# Patient Record
Sex: Male | Born: 1965 | Race: White | Hispanic: No | Marital: Married | Smoking: Former smoker
Health system: Southern US, Community
[De-identification: ages and names within clinical notes are randomized; demographics above are authoritative.]

## PROBLEM LIST (undated history)

## (undated) ENCOUNTER — Encounter
Admission: RE | Disposition: A | Discharge status: Home / Self Care | Payer: Self-pay | Source: Home / Self Care | Attending: Surgery

## (undated) DIAGNOSIS — I10 Essential (primary) hypertension: Secondary | ICD-10-CM

## (undated) DIAGNOSIS — J449 Chronic obstructive pulmonary disease, unspecified: Secondary | ICD-10-CM

## (undated) DIAGNOSIS — Z85528 Personal history of other malignant neoplasm of kidney: Secondary | ICD-10-CM

## (undated) DIAGNOSIS — M1990 Unspecified osteoarthritis, unspecified site: Secondary | ICD-10-CM

## (undated) DIAGNOSIS — K219 Gastro-esophageal reflux disease without esophagitis: Secondary | ICD-10-CM

## (undated) DIAGNOSIS — N200 Calculus of kidney: Secondary | ICD-10-CM

## (undated) DIAGNOSIS — E785 Hyperlipidemia, unspecified: Secondary | ICD-10-CM

## (undated) DIAGNOSIS — C801 Malignant (primary) neoplasm, unspecified: Secondary | ICD-10-CM

## (undated) DIAGNOSIS — E119 Type 2 diabetes mellitus without complications: Secondary | ICD-10-CM

## (undated) DIAGNOSIS — L409 Psoriasis, unspecified: Secondary | ICD-10-CM

## (undated) DIAGNOSIS — Z972 Presence of dental prosthetic device (complete) (partial): Secondary | ICD-10-CM

## (undated) DIAGNOSIS — L309 Dermatitis, unspecified: Secondary | ICD-10-CM

## (undated) HISTORY — DX: Essential (primary) hypertension: I10

## (undated) HISTORY — DX: Dermatitis, unspecified: L30.9

## (undated) HISTORY — DX: Personal history of other malignant neoplasm of kidney: Z85.528

## (undated) HISTORY — PX: OTHER SURGICAL HISTORY: SHX169

## (undated) HISTORY — DX: Calculus of kidney: N20.0

## (undated) HISTORY — PX: KNEE SURGERY: SHX244

## (undated) HISTORY — PX: VARICOCELE EXCISION: SUR582

## (undated) HISTORY — DX: Hyperlipidemia, unspecified: E78.5

## (undated) HISTORY — PX: REPLACEMENT TOTAL KNEE: SUR1224

## (undated) HISTORY — PX: JOINT REPLACEMENT: SHX530

## (undated) HISTORY — DX: Type 2 diabetes mellitus without complications: E11.9

## (undated) SURGERY — REPAIR, HERNIA, VENTRAL, ROBOT-ASSISTED
Anesthesia: General

---

## 2003-04-17 ENCOUNTER — Emergency Department (HOSPITAL_COMMUNITY): Admission: EM | Admit: 2003-04-17 | Discharge: 2003-04-17 | Payer: Self-pay | Admitting: Emergency Medicine

## 2003-04-22 ENCOUNTER — Encounter: Admission: RE | Admit: 2003-04-22 | Discharge: 2003-07-21 | Payer: Self-pay | Admitting: Family Medicine

## 2005-05-21 ENCOUNTER — Emergency Department (HOSPITAL_COMMUNITY): Admission: EM | Admit: 2005-05-21 | Discharge: 2005-05-21 | Payer: Self-pay | Admitting: Emergency Medicine

## 2009-04-18 ENCOUNTER — Emergency Department: Payer: Self-pay | Admitting: Unknown Physician Specialty

## 2010-09-15 ENCOUNTER — Emergency Department: Payer: Self-pay | Admitting: Emergency Medicine

## 2013-02-01 ENCOUNTER — Other Ambulatory Visit: Payer: Self-pay | Admitting: Family Medicine

## 2013-02-01 LAB — COMPREHENSIVE METABOLIC PANEL
Albumin: 3.7 g/dL (ref 3.4–5.0)
Alkaline Phosphatase: 96 U/L (ref 50–136)
Anion Gap: 5 — ABNORMAL LOW (ref 7–16)
BUN: 24 mg/dL — ABNORMAL HIGH (ref 7–18)
Bilirubin,Total: 0.4 mg/dL (ref 0.2–1.0)
Calcium, Total: 9.1 mg/dL (ref 8.5–10.1)
Chloride: 104 mmol/L (ref 98–107)
Co2: 26 mmol/L (ref 21–32)
Creatinine: 0.86 mg/dL (ref 0.60–1.30)
EGFR (African American): >60
EGFR (Non-African Amer.): >60
Glucose: 217 mg/dL — ABNORMAL HIGH (ref 65–99)
Osmolality: 281 (ref 275–301)
Potassium: 4.2 mmol/L (ref 3.5–5.1)
SGOT(AST): 16 U/L (ref 15–37)
SGPT (ALT): 25 U/L (ref 12–78)
Sodium: 135 mmol/L — ABNORMAL LOW (ref 136–145)
Total Protein: 7.1 g/dL (ref 6.4–8.2)

## 2013-02-01 LAB — LIPID PANEL
Cholesterol: 232 mg/dL — ABNORMAL HIGH (ref 0–200)
HDL Cholesterol: 47 mg/dL (ref 40–60)
Ldl Cholesterol, Calc: 162 mg/dL — ABNORMAL HIGH (ref 0–100)
Triglycerides: 117 mg/dL (ref 0–200)
VLDL Cholesterol, Calc: 23 mg/dL (ref 5–40)

## 2013-02-01 LAB — HEMOGLOBIN A1C: Hemoglobin A1C: 10.8 % — ABNORMAL HIGH (ref 4.2–6.3)

## 2013-07-13 HISTORY — PX: NASAL SINUS SURGERY: SHX719

## 2013-07-31 ENCOUNTER — Ambulatory Visit: Payer: Self-pay | Admitting: Anesthesiology

## 2013-07-31 LAB — BASIC METABOLIC PANEL
Anion Gap: 6 — ABNORMAL LOW (ref 7–16)
BUN: 18 mg/dL (ref 7–18)
Calcium, Total: 8.9 mg/dL (ref 8.5–10.1)
Chloride: 100 mmol/L (ref 98–107)
Co2: 26 mmol/L (ref 21–32)
Creatinine: 0.72 mg/dL (ref 0.60–1.30)
EGFR (African American): >60
EGFR (Non-African Amer.): >60
Glucose: 246 mg/dL — ABNORMAL HIGH (ref 65–99)
Osmolality: 275 (ref 275–301)
Potassium: 4.2 mmol/L (ref 3.5–5.1)
Sodium: 132 mmol/L — ABNORMAL LOW (ref 136–145)

## 2013-08-04 ENCOUNTER — Ambulatory Visit: Payer: Self-pay | Admitting: Otolaryngology

## 2013-08-06 LAB — PATHOLOGY REPORT

## 2013-08-11 LAB — WOUND CULTURE

## 2014-07-13 ENCOUNTER — Encounter: Payer: Self-pay | Admitting: Nurse Practitioner

## 2014-07-14 ENCOUNTER — Encounter
Admit: 2014-07-14 | Disposition: A | Discharge status: Home / Self Care | Payer: Self-pay | Attending: Nurse Practitioner | Admitting: Nurse Practitioner

## 2014-08-14 ENCOUNTER — Encounter
Admit: 2014-08-14 | Disposition: A | Discharge status: Home / Self Care | Payer: Self-pay | Attending: Nurse Practitioner | Admitting: Nurse Practitioner

## 2014-09-05 NOTE — Op Note (Signed)
PATIENT NAME:  Matthew Woodard, Matthew Woodard MR#:  [id number] DATE OF BIRTH:  01-Mar-1966  DATE OF PROCEDURE:  08/04/2013  PREOPERATIVE DIAGNOSES:  1.  Chronic rhinosinusitis.  2.  Chronic maxillary sinusitis bilaterally.  3.  Chronic right frontal and ethmoid sinusitis.   POSTOPERATIVE DIAGNOSES: 1.  Chronic rhinosinusitis.  2.  Chronic maxillary sinusitis bilaterally.  3.  Chronic right frontal and ethmoid sinusitis.   PROCEDURE PERFORMED:  1.  Right maxillary antrostomy with tissue removal.  2.  Left maxillary antrostomy with tissue removal.  3.  Right total ethmoidectomy.  4.  Right frontal sinusotomy.  5.  Image-guided sinus surgery.  SURGEON: Carloyn Manner, M.D.   ANESTHESIA: General endotracheal anesthesia.   ESTIMATED BLOOD LOSS: 100 mL.   IV FLUIDS: Please see anesthesia record.   COMPLICATIONS: None.   DRAINS/STENT PLACEMENTS: Stammberger Sinu-Foam and XeroGel.   SPECIMENS: Right-sided sinus contents   INDICATIONS FOR PROCEDURE: The patient is a 49 year old gentleman, status post right maxillary tooth extraction with resultant persistent chronic sinusitis involving the right maxillary sinus and right ethmoid and right frontal as well as left maxillary sinus. It has been resistant to culture directed antibiotics.   OPERATIVE FINDINGS: Severe mucosal inflammation and purulence filling of right maxillary sinus and right ethmoid, severe inflammation on the right frontal sinus outflow tract and a moderate amount of inflammation of the mucosa of the left maxillary sinus.   DESCRIPTION OF PROCEDURE: After the patient was identified in holding, benefits and risks of the procedure were discussed and consent was reviewed, the patient was taken to the operating room and placed in the supine position. General endotracheal anesthesia was induced. The patient was rotated 90 degrees. The Stryker image guided sinus system was set up and calibrated in normal fashion with acceptable error of 0.5 mm.  This was referred to throughout the duration of the case to ensure proper positioning of the instruments and ensure correct placement. At this time, 2.5 mL of 1% lidocaine with 1:100,000 epinephrine were injected into the anterior septum, anterior/inferior turbinates as well as anterior middle turbinates bilaterally and Afrin-soaked-soaked pledgets were placed in the patient's nasal cavity. The patient, at this time, was prepped and draped in sterile fashion and under endoscopic visualization using a freer elevator, the middle turbinate was medialized bilaterally and then the uncinate process was outfractured on the patient's right side. Acclarent balloon sinuplasty device was used to dilate the right frontal sinus outflow tract and fractured agger nasi cell pieces were removed with up-biting forceps. Care was taken to avoid injury of the circumferential mucosal injury of the frontal sinus outflow tract.   At this time, attention was directed to patient's right maxillary sinus. There was significant inflammation and purulence emanating from the natural os. A ball-tip probe was inserted into the patient's maxillary sinus and the natural os was dilated and then using a Diego microdebrider, the maxillary antrostomy was flayed open and the surgical antrostomy was enlarged. A significant amount of mucosal edema and erythema and purulence was noted. A culture was taken. At this time, attention was directed to the patient's right ethmoid. There was a significant amount of osteitic bone present as well as mucosal inflammation and purulence. The image guided sinus suction was used to fracture the ethmoid bulla in the medial and inferior aspect and then the anterior ethmoid air cells were slowly crushed and removed with a combination of suction and the straight biting pediatric forceps. The posterior ethmoid air cells were encountered. These were opened in the  medial and inferior position again with image sinus suction.  This demonstrated a moderate amount of mucosal edema and decreased osteitic bone and at this point the crushed ethmoid cells were removed in piecemeal fashion using a straight biting forceps. At this time, the patient's right nasal cavity was copiously irrigated with sterile saline and any remaining purulence in the maxillary and ethmoids and frontal sinus was removed with suction and Afrin-soaked pledgets were placed for hemostasis.  Attention, at this time, was directed to the patient's left sinus. In a similar fashion, the middle turbinate was medialized. The uncinate process was outfractured using a ball-tip probe. The remaining uncinate was removed using a Diego microdebrider and then the left maxillary antrostomy was opened using a ball-tip probe and surgical antrostomy was enlarged using a Diego microdebrider. This demonstrated a moderate amount of left maxillary sinus mucosal edema and erythema. At this time, the patient's nasal cavities were copiously irrigated bilaterally. The image guided sinus suction was used to ensure all opacified air cells were open and the frontal sinus outflow tract was widely patent. Stammberger Sinu-Foam was placed along the cut edge of the maxillary antrostomies bilaterally as well as posterior ethmoid and then XeroGel was cut in half and folded and placed lateral to the middle turbinate and this was inflated with saline. At this time, care of the patient was transferred to anesthesia, where the patient tolerated the procedure well and was taken to PACU in good condition.  ____________________________ Jerene Bears, MD ccv:aw D: 08/04/2013 11:15:11 ET T: 08/04/2013 11:27:41 ET JOB#: [id number]  cc: Jerene Bears, MD, <Dictator> Jerene Bears MD ELECTRONICALLY SIGNED 08/18/2013 11:38

## 2014-10-19 ENCOUNTER — Telehealth: Payer: Self-pay

## 2014-10-19 MED ORDER — LISINOPRIL 10 MG PO TABS: 10.0000 mg | ORAL_TABLET | Freq: Every day | ORAL | Status: DC

## 2014-10-19 MED ORDER — SITAGLIPTIN PHOSPHATE 100 MG PO TABS: 100.0000 mg | ORAL_TABLET | Freq: Every day | ORAL | Status: DC

## 2014-10-19 MED ORDER — METFORMIN HCL ER 500 MG PO TB24: 500.0000 mg | ORAL_TABLET | Freq: Four times a day (QID) | ORAL | Status: DC

## 2014-10-19 NOTE — Telephone Encounter (Signed)
Please ask pt to schedule an appt please in the next few weeks, labs then too (fasting)

## 2014-10-20 NOTE — Telephone Encounter (Signed)
June 23 @ 8am pt booked appt.

## 2014-10-20 NOTE — Telephone Encounter (Signed)
I called patient and left Vm to give [place] a call back to schedule a F/U with fasting labs, thanks. TM

## 2014-11-02 ENCOUNTER — Other Ambulatory Visit: Payer: Self-pay | Admitting: Family Medicine

## 2014-11-02 ENCOUNTER — Other Ambulatory Visit: Payer: BLUE CROSS/BLUE SHIELD

## 2014-11-02 DIAGNOSIS — Z5181 Encounter for therapeutic drug level monitoring: Secondary | ICD-10-CM

## 2014-11-02 DIAGNOSIS — E119 Type 2 diabetes mellitus without complications: Secondary | ICD-10-CM | POA: Insufficient documentation

## 2014-11-02 DIAGNOSIS — E785 Hyperlipidemia, unspecified: Secondary | ICD-10-CM

## 2014-11-02 HISTORY — DX: Hyperlipidemia, unspecified: E78.5

## 2014-11-03 LAB — LIPID PANEL W/O CHOL/HDL RATIO
Cholesterol, Total: 157 mg/dL (ref 100–199)
HDL: 50 mg/dL (ref >39)
LDL Calculated: 80 mg/dL (ref 0–99)
Triglycerides: 134 mg/dL (ref 0–149)
VLDL Cholesterol Cal: 27 mg/dL (ref 5–40)

## 2014-11-03 LAB — COMPREHENSIVE METABOLIC PANEL
ALT: 20 IU/L (ref 0–44)
AST: 15 IU/L (ref 0–40)
Albumin/Globulin Ratio: 2 (ref 1.1–2.5)
Albumin: 4.4 g/dL (ref 3.5–5.5)
Alkaline Phosphatase: 68 IU/L (ref 39–117)
BUN/Creatinine Ratio: 28 — ABNORMAL HIGH (ref 9–20)
BUN: 19 mg/dL (ref 6–24)
Bilirubin Total: 0.4 mg/dL (ref 0.0–1.2)
CO2: 23 mmol/L (ref 18–29)
Calcium: 9.2 mg/dL (ref 8.7–10.2)
Chloride: 99 mmol/L (ref 97–108)
Creatinine, Ser: 0.67 mg/dL — ABNORMAL LOW (ref 0.76–1.27)
GFR calc Af Amer: 130 mL/min/{1.73_m2} (ref >59)
GFR calc non Af Amer: 113 mL/min/{1.73_m2} (ref >59)
Globulin, Total: 2.2 g/dL (ref 1.5–4.5)
Glucose: 137 mg/dL — ABNORMAL HIGH (ref 65–99)
Potassium: 4.7 mmol/L (ref 3.5–5.2)
Sodium: 141 mmol/L (ref 134–144)
Total Protein: 6.6 g/dL (ref 6.0–8.5)

## 2014-11-03 LAB — HGB A1C W/O EAG: Hgb A1c MFr Bld: 7.9 % — ABNORMAL HIGH (ref 4.8–5.6)

## 2014-11-05 ENCOUNTER — Ambulatory Visit (INDEPENDENT_AMBULATORY_CARE_PROVIDER_SITE_OTHER): Payer: BLUE CROSS/BLUE SHIELD | Admitting: Family Medicine

## 2014-11-05 ENCOUNTER — Encounter: Payer: Self-pay | Admitting: Family Medicine

## 2014-11-05 VITALS — BP 130/78 | HR 80 | Temp 98.3°F | Ht 71.0 in | Wt 224.6 lb

## 2014-11-05 DIAGNOSIS — E785 Hyperlipidemia, unspecified: Secondary | ICD-10-CM

## 2014-11-05 DIAGNOSIS — L259 Unspecified contact dermatitis, unspecified cause: Secondary | ICD-10-CM | POA: Diagnosis not present

## 2014-11-05 DIAGNOSIS — E1165 Type 2 diabetes mellitus with hyperglycemia: Secondary | ICD-10-CM | POA: Diagnosis not present

## 2014-11-05 DIAGNOSIS — K219 Gastro-esophageal reflux disease without esophagitis: Secondary | ICD-10-CM | POA: Insufficient documentation

## 2014-11-05 MED ORDER — OMEPRAZOLE 20 MG PO CPDR: 20.0000 mg | DELAYED_RELEASE_CAPSULE | Freq: Every day | ORAL | Status: DC | PRN

## 2014-11-05 NOTE — Assessment & Plan Note (Signed)
Discussed foods to avoid/limit; tums okay for symptomatic; elevated HOB; hand-out, PPI short-term

## 2014-11-05 NOTE — Progress Notes (Signed)
BP 130/78 mmHg  Pulse 80  Temp(Src) 98.3 F (36.8 C) (Oral)  Ht 5\' 11"  (1.803 m)  Wt 224 lb 9.6 oz (101.878 kg)  BMI 31.34 kg/m2  SpO2 97%   Subjective:    Patient ID: Matthew Woodard, male    DOB: 08/29/1965, 49 y.o.   MRN: [id number]  HPI: Matthew Woodard is a 49 y.o. male  Chief Complaint  Patient presents with  . Follow-up   Heartburn and acid reflex; going on for a few month; helped by tums only temporarily but all-day PPI probably Rx helps the best Certain triggers are worse, most of the time it doesn't matter; not every meal; later in the day; some fried foods, sometimes big lunches but even after a salad once; not much weight gain; some stress; no boring abd pain, no blood in the stool;   He had terrible rash on the fingers and used the strong cream; he has cracks and fissures; better than it was after taking vitamin D; skin peels and cracks; left hand is fine; right hand is affected; gets hands in brake cleaner and diesel fuel; does not use gloves  We reviewed his recent labs  Diabetes mellitus; A1c is much better; no excessive dry mouth or thirst; keeps hydrated; does not go barefoot at home; no problems with his feet  High cholesterol, last lipids reviewed  Relevant past medical, surgical, family and social history reviewed and updated as indicated. Interim medical history since our last visit reviewed. Allergies and medications reviewed and updated.  Review of Systems  Respiratory: Negative for shortness of breath.   Cardiovascular: Negative for chest pain and leg swelling.  Gastrointestinal: Negative for blood in stool.  Neurological: Negative for numbness.    Per HPI unless specifically indicated above     Objective:    BP 130/78 mmHg  Pulse 80  Temp(Src) 98.3 F (36.8 C) (Oral)  Ht 5\' 11"  (1.803 m)  Wt 224 lb 9.6 oz (101.878 kg)  BMI 31.34 kg/m2  SpO2 97%  Wt Readings from Last 3 Encounters:  11/05/14 224 lb 9.6 oz (101.878 kg)  07/02/14  200 lb (90.719 kg)    Physical Exam  Constitutional: He appears well-developed and well-nourished. No distress.  HENT:  Head: Normocephalic and atraumatic.  Nose: No rhinorrhea.  Mouth/Throat: Mucous membranes are normal.  Eyes: EOM are normal. No scleral icterus.  Neck: No thyromegaly present.  Cardiovascular: Normal rate, regular rhythm and normal heart sounds.   No murmur heard. Pulmonary/Chest: Effort normal and breath sounds normal. No respiratory distress. He has no wheezes.  Abdominal: Soft. Bowel sounds are normal. He exhibits no distension. There is no tenderness. There is no guarding.  Musculoskeletal: He exhibits no edema.  Neurological: He is alert. He displays no tremor. He exhibits normal muscle tone.  Skin: Skin is warm and dry. Rash noted. He is not diaphoretic. No cyanosis.  Cracks in the extensor surface 2nd and 3rd fingers right hand; dryness, mild erythema; no scale; no vesicles  Psychiatric: He has a normal mood and affect. His behavior is normal. Judgment and thought content normal.  Nursing note and vitals reviewed.  Diabetic Foot Form - Detailed   Diabetic Foot Exam - detailed  Diabetic Foot exam was performed with the following findings:  Yes 11/05/2014  8:51 AM  Visual Foot Exam completed.:  Yes  Can the patient see the bottom of their feet?:  Yes  Is there swelling or and abnormal foot shape?:  No  Are the toenails long?:  No  Are the toenails thick?:  No  Are the toenails ingrown?:  No  Normal Range of Motion:  Yes    Pulse Foot Exam completed.:  Yes  Right Dorsalis Pedis:  Present Left Dorsalis Pedis:  Present  Sensory Foot Exam Completed.:  Yes  Semmes-Weinstein Monofilament Test  R Site 1-Great Toe:  Pos L Site 1-Great Toe:  Pos    Comments:  Tiny spot of redness each great toe; pointed out to patient for him to watch, suspect small scratch, but no drainage, no surrounding erythema, no induration       Results for orders placed or performed in  visit on 11/02/14  Comprehensive metabolic panel  Result Value Ref Range   Glucose 137 (H) 65 - 99 mg/dL   BUN 19 6 - 24 mg/dL   Creatinine, Ser 0.67 (L) 0.76 - 1.27 mg/dL   GFR calc non Af Amer 113 >59 mL/min/1.73   GFR calc Af Amer 130 >59 mL/min/1.73   BUN/Creatinine Ratio 28 (H) 9 - 20   Sodium 141 134 - 144 mmol/L   Potassium 4.7 3.5 - 5.2 mmol/L   Chloride 99 97 - 108 mmol/L   CO2 23 18 - 29 mmol/L   Calcium 9.2 8.7 - 10.2 mg/dL   Total Protein 6.6 6.0 - 8.5 g/dL   Albumin 4.4 3.5 - 5.5 g/dL   Globulin, Total 2.2 1.5 - 4.5 g/dL   Albumin/Globulin Ratio 2.0 1.1 - 2.5   Bilirubin Total 0.4 0.0 - 1.2 mg/dL   Alkaline Phosphatase 68 39 - 117 IU/L   AST 15 0 - 40 IU/L   ALT 20 0 - 44 IU/L  Lipid Panel w/o Chol/HDL Ratio  Result Value Ref Range   Cholesterol, Total 157 100 - 199 mg/dL   Triglycerides 134 0 - 149 mg/dL   HDL 50 >39 mg/dL   VLDL Cholesterol Cal 27 5 - 40 mg/dL   LDL Calculated 80 0 - 99 mg/dL  Hgb A1c w/o eAG  Result Value Ref Range   Hgb A1c MFr Bld 7.9 (H) 4.8 - 5.6 %      Assessment & Plan:   Problem List Items Addressed This Visit      Digestive   Acid reflux    Discussed foods to avoid/limit; tums okay for symptomatic; elevated HOB; hand-out, PPI short-term      Relevant Medications   omeprazole (PRILOSEC) 20 MG capsule     Endocrine   Type 2 diabetes mellitus - Primary    Much improved; next A1C on or after Sept 20th; goal is less than 7; little bit of weight loss; foot exam by MD today; keep eye on feet at home        Other   Hyperlipemia    Lipids reviewed; all are at goal; continue statin, limit saturated fats and modest weight loss; next lipids and sgpt in 6 months (skip those labs at next visit)       Other Visit Diagnoses    Contact dermatitis        protective emolient; vit D; 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

## 2014-11-05 NOTE — Assessment & Plan Note (Signed)
Lipids reviewed; all are at goal; continue statin, limit saturated fats and modest weight loss; next lipids and sgpt in 6 months (skip those labs at next visit)

## 2014-11-05 NOTE — Assessment & Plan Note (Signed)
Much improved; next A1C on or after Sept 20th; goal is less than 7; little bit of weight loss; foot exam by MD today; keep eye on feet at home

## 2014-11-05 NOTE — Patient Instructions (Addendum)
Keep up the great job with your numbers, next A1C due in 3 months Start the new medicine omeprazole if needed for heartburn and acid reflux Avoid or limit triggers (OJ, spaghetti sauce, soft drinks, etc.) Start taking 5,000 iu of vitamin D3 once a day for 3-4 months, then back it down to about 1,000 or 2,000 iu daily Offer is open for dermatology consultation  Lab Results  Component Value Date   HGBA1C 7.9* 11/02/2014   Lab Results  Component Value Date   CHOL 157 11/02/2014   HDL 50 11/02/2014   LDLCALC 80 11/02/2014   TRIG 134 11/02/2014    Heartburn Heartburn is a painful, burning feeling in the chest. It may feel worse when you lie down or bend over. Heartburn is caused by stomach acid moving into the tube that carries food from the mouth to the stomach (esophagus). HOME CARE  Take all medicine as told by your doctor.  Raise the head of your bed with blocks only as told by your doctor.  Do not exercise right after eating.  Avoid eating 2 or 3 hours before bed. Do not lie down right after eating.  Eat small meals throughout the day instead of 3 large meals.  Stop smoking if you smoke.  Keep up a healthy weight.  Avoid foods that give you heartburn. Foods you may want to avoid include:  Peppers.  Chocolate.  High-fat foods, including fried foods.  Spicy foods.  Garlic and onions.  Citrus fruits, including oranges, grapefruit, lemons, and limes.  Food containing tomatoes or tomato products.  Mint.  Bubbly (carbonated) drinks and drinks with caffeine.  Vinegar. GET HELP RIGHT AWAY IF:  You have bad chest pain that goes down your arm or into your jaw or neck.  You feel sweaty, dizzy, or lightheaded.  You have trouble breathing.  You throw up (vomit) blood.  You have trouble or pain when swallowing.  You have bloody or black poop (stool).  You have heartburn more than 3 times a week, for more than 2 weeks. MAKE SURE YOU:  Understand these  instructions.  Will watch your condition.  Will get help right away if you are not doing well or get worse. Document Released: 01/11/2011 Document Revised: 07/24/2011 Document Reviewed: 01/11/2011 [facility] Patient Information 2015 [place], [place]. This information is not intended to replace advice given to you by your health care provider. Make sure you discuss any questions you have with your health care provider.

## 2014-11-25 ENCOUNTER — Other Ambulatory Visit: Payer: Self-pay | Admitting: Family Medicine

## 2014-11-30 ENCOUNTER — Encounter: Payer: Self-pay | Admitting: *Deleted

## 2014-11-30 ENCOUNTER — Telehealth: Payer: Self-pay

## 2014-11-30 DIAGNOSIS — L729 Follicular cyst of the skin and subcutaneous tissue, unspecified: Secondary | ICD-10-CM

## 2014-11-30 NOTE — Telephone Encounter (Signed)
He has a cyst on his lower back, off and on for a couple of years now. It's "flared" back up again and wants to know if you'd be willing to remove it or if he should see a surgeon? [phone]

## 2014-11-30 NOTE — Telephone Encounter (Signed)
Without seeing it, I don't know how big it is, but I would guess it will be best to have a surgeon see him if it's been there for a few years; those can get involved and deep Refer to surgeon

## 2014-12-01 NOTE — Telephone Encounter (Signed)
Patient notified

## 2014-12-08 ENCOUNTER — Ambulatory Visit (INDEPENDENT_AMBULATORY_CARE_PROVIDER_SITE_OTHER): Payer: BLUE CROSS/BLUE SHIELD | Admitting: General Surgery

## 2014-12-08 ENCOUNTER — Encounter: Payer: Self-pay | Admitting: General Surgery

## 2014-12-08 VITALS — BP 124/78 | HR 80 | Resp 12 | Ht 71.0 in | Wt 234.0 lb

## 2014-12-08 DIAGNOSIS — L729 Follicular cyst of the skin and subcutaneous tissue, unspecified: Secondary | ICD-10-CM

## 2014-12-08 NOTE — Patient Instructions (Signed)
Patient to be scheduled for a Friday appointment for excision of skin cyst on back.

## 2014-12-08 NOTE — Progress Notes (Signed)
Patient ID: Matthew Woodard, male   DOB: 01/21/1966, 49 y.o.   MRN: [id number]  Chief Complaint  Patient presents with  . Cyst    HPI Matthew Woodard is a 49 y.o. male here today for a evaluation of a cyst on his back. Patient states he has noticed it being there for about two years. He states there is some pain when he sits down. He is noticing pain more frequently now but not continuous. HPI  Past Medical History  Diagnosis Date  . Hyperlipidemia   . Hypertension   . Diabetes mellitus without complication     Past Surgical History  Procedure Laterality Date  . Knee surgery    . Varicocele excision    . Nasal sinus surgery  03/15    Family History  Problem Relation Age of Onset  . Heart disease Father   . Hypertension Father   . COPD Father     Social History History  Substance Use Topics  . Smoking status: Former Smoker    Types: Cigarettes    Quit date: 06/15/2013  . Smokeless tobacco: Never Used  . Alcohol Use: No    Allergies  Allergen Reactions  . Glucotrol [Glipizide] Rash    Current Outpatient Prescriptions  Medication Sig Dispense Refill  . atorvastatin (LIPITOR) 20 MG tablet Take 1 tablet (20 mg total) by mouth at bedtime. 90 tablet 1  . canagliflozin (INVOKANA) 300 MG TABS tablet Take 300 mg by mouth daily. 90 tablet 1  . clobetasol cream (TEMOVATE) 6.94 % Apply 1 application topically 2 (two) times daily.    . Glucose Blood (ACCU-CHEK AVIVA PLUS VI) by In Vitro route.    Marland Kitchen lisinopril (PRINIVIL,ZESTRIL) 10 MG tablet Take 1 tablet (10 mg total) by mouth daily. 90 tablet 1  . metFORMIN (GLUCOPHAGE-XR) 500 MG 24 hr tablet Take 4 tablets (2,000 mg total) by mouth daily. 360 tablet 1  . omeprazole (PRILOSEC) 20 MG capsule Take 1 capsule (20 mg total) by mouth daily as needed. 30 capsule 3  . sitaGLIPtin (JANUVIA) 100 MG tablet Take 1 tablet (100 mg total) by mouth daily. 90 tablet 3   No current facility-administered medications for this visit.     Review of Systems Review of Systems  Constitutional: Negative.   Respiratory: Negative.   Cardiovascular: Negative.     Blood pressure 124/78, pulse 80, resp. rate 12, height 5\' 11"  (1.803 m), weight 234 lb (106.142 kg).  Physical Exam Physical Exam  Constitutional: He is oriented to person, place, and time. He appears well-developed and well-nourished.  Eyes: Conjunctivae are normal. No scleral icterus.  Neck: Neck supple.  Lymphadenopathy:    He has no cervical adenopathy.  Neurological: He is alert and oriented to person, place, and time.  Skin: Skin is warm and dry.       Data Reviewed None  Assessment    Symptomatic skin cyst right lower back.      Plan    Recommended excision and procedure explained. Patient is agreeable.  Patient to be scheduled for a Friday appointment for excision of skin cyst on back.        Leor Whyte G 12/08/2014, 8:34 PM

## 2014-12-14 HISTORY — PX: CYST EXCISION: SHX5701

## 2014-12-25 ENCOUNTER — Ambulatory Visit (INDEPENDENT_AMBULATORY_CARE_PROVIDER_SITE_OTHER): Payer: BLUE CROSS/BLUE SHIELD | Admitting: General Surgery

## 2014-12-25 ENCOUNTER — Encounter: Payer: Self-pay | Admitting: General Surgery

## 2014-12-25 VITALS — BP 150/72 | HR 76 | Resp 12 | Ht 71.0 in | Wt 231.0 lb

## 2014-12-25 DIAGNOSIS — L729 Follicular cyst of the skin and subcutaneous tissue, unspecified: Secondary | ICD-10-CM

## 2014-12-25 NOTE — Patient Instructions (Addendum)
Patient to return in 2 week nurse to remove sutures

## 2014-12-25 NOTE — Progress Notes (Signed)
Patient ID: Matthew Woodard, male   DOB: 10-Sep-1965, 49 y.o.   MRN: [id number]  Chief Complaint  Patient presents with  . Procedure    excision cyst on back    HPI Matthew Woodard is a 49 y.o. male here today for a excision of a back cyst. HPI  Past Medical History  Diagnosis Date  . Hyperlipidemia   . Hypertension   . Diabetes mellitus without complication     Past Surgical History  Procedure Laterality Date  . Knee surgery    . Varicocele excision    . Nasal sinus surgery  03/15    Family History  Problem Relation Age of Onset  . Heart disease Father   . Hypertension Father   . COPD Father     Social History Social History  Substance Use Topics  . Smoking status: Former Smoker    Types: Cigarettes    Quit date: 06/15/2013  . Smokeless tobacco: Never Used  . Alcohol Use: No    Allergies  Allergen Reactions  . Glucotrol [Glipizide] Rash    Current Outpatient Prescriptions  Medication Sig Dispense Refill  . atorvastatin (LIPITOR) 20 MG tablet Take 1 tablet (20 mg total) by mouth at bedtime. 90 tablet 1  . canagliflozin (INVOKANA) 300 MG TABS tablet Take 300 mg by mouth daily. 90 tablet 1  . clobetasol cream (TEMOVATE) 6.76 % Apply 1 application topically 2 (two) times daily.    . Glucose Blood (ACCU-CHEK AVIVA PLUS VI) by In Vitro route.    Marland Kitchen lisinopril (PRINIVIL,ZESTRIL) 10 MG tablet Take 1 tablet (10 mg total) by mouth daily. 90 tablet 1  . metFORMIN (GLUCOPHAGE-XR) 500 MG 24 hr tablet Take 4 tablets (2,000 mg total) by mouth daily. 360 tablet 1  . omeprazole (PRILOSEC) 20 MG capsule Take 1 capsule (20 mg total) by mouth daily as needed. 30 capsule 3  . sitaGLIPtin (JANUVIA) 100 MG tablet Take 1 tablet (100 mg total) by mouth daily. 90 tablet 3   No current facility-administered medications for this visit.    Review of Systems Review of Systems  Constitutional: Negative.   Respiratory: Negative.   Cardiovascular: Negative.     Blood pressure  150/72, pulse 76, resp. rate 12, height 5\' 11"  (1.803 m), weight 231 lb (104.781 kg).  Physical Exam Physical Exam 2cm skin cyst low back to right of spine. Data Reviewed    Assessment    Inflamed skin cyst. Removed as planned with pt consent    Plan    Procedure  Excision skin cyst low back with intermediate closure. Anesthetic: 43ml 1%xylocaine mixed with0.5% marcaine.  Area prepped with Chloro prep and draped. Transverse elliptical 2.5cm incision made around the cyst. Cyst excised out completely from underlying subcutaneous tissue. Bleeders were suture ligated with 3-0 vicryl. Subcutaneous tissue closed with 3-0 Vicryl. Skin closed with 3-0 nylon interrupted vertical mattress sutures. Neosporin, telfa and tegaderm placed.  No immediate problems from procedure. Advised on wound care. Sutures to be removed in about 2 weeks.      PCP:  Hillery Jacks 12/25/2014, 9:58 AM

## 2014-12-29 ENCOUNTER — Telehealth: Payer: Self-pay | Admitting: *Deleted

## 2014-12-29 NOTE — Telephone Encounter (Signed)
-----   Message from [name], MD sent at 12/29/2014  4:48 PM EDT ----- Please let pt pt know the pathology was normal.

## 2014-12-30 NOTE — Telephone Encounter (Signed)
Patient called back and was giving pathology results. He was very pleased with the results

## 2015-01-07 ENCOUNTER — Ambulatory Visit (INDEPENDENT_AMBULATORY_CARE_PROVIDER_SITE_OTHER): Payer: BLUE CROSS/BLUE SHIELD | Admitting: *Deleted

## 2015-01-07 DIAGNOSIS — L729 Follicular cyst of the skin and subcutaneous tissue, unspecified: Secondary | ICD-10-CM

## 2015-01-07 NOTE — Progress Notes (Signed)
Patient came in today for a wound check/suture removal.  The wound is clean, with no signs of infection noted. Follow up as scheduled.

## 2015-01-07 NOTE — Patient Instructions (Signed)
The patient is aware to call back for any questions or concerns.  

## 2015-02-04 ENCOUNTER — Ambulatory Visit: Payer: BLUE CROSS/BLUE SHIELD | Admitting: Family Medicine

## 2015-02-19 ENCOUNTER — Ambulatory Visit: Payer: BLUE CROSS/BLUE SHIELD | Admitting: Family Medicine

## 2015-03-02 ENCOUNTER — Encounter: Payer: Self-pay | Admitting: Family Medicine

## 2015-03-02 ENCOUNTER — Ambulatory Visit (INDEPENDENT_AMBULATORY_CARE_PROVIDER_SITE_OTHER): Payer: BLUE CROSS/BLUE SHIELD | Admitting: Family Medicine

## 2015-03-02 VITALS — BP 133/84 | HR 78 | Temp 98.0°F | Ht 71.5 in | Wt 227.0 lb

## 2015-03-02 DIAGNOSIS — E1165 Type 2 diabetes mellitus with hyperglycemia: Secondary | ICD-10-CM

## 2015-03-02 DIAGNOSIS — L301 Dyshidrosis [pompholyx]: Secondary | ICD-10-CM

## 2015-03-02 DIAGNOSIS — E785 Hyperlipidemia, unspecified: Secondary | ICD-10-CM | POA: Diagnosis not present

## 2015-03-02 DIAGNOSIS — K219 Gastro-esophageal reflux disease without esophagitis: Secondary | ICD-10-CM | POA: Diagnosis not present

## 2015-03-02 DIAGNOSIS — E559 Vitamin D deficiency, unspecified: Secondary | ICD-10-CM | POA: Insufficient documentation

## 2015-03-02 DIAGNOSIS — Z5181 Encounter for therapeutic drug level monitoring: Secondary | ICD-10-CM | POA: Diagnosis not present

## 2015-03-02 DIAGNOSIS — Z23 Encounter for immunization: Secondary | ICD-10-CM

## 2015-03-02 NOTE — Assessment & Plan Note (Signed)
Check level today and supplement if needed; may be making dermatitis in the hands worse

## 2015-03-02 NOTE — Assessment & Plan Note (Signed)
Check A1C; check feet every night; foot exam by MD today; patient to call to make his own eye appointment; check urine microalbumin

## 2015-03-02 NOTE — Assessment & Plan Note (Signed)
Cut down on eggs and cheese and fatty meats; try to get more fiber and fruits and veggies; check lipids next time because last lipids were so good; continue statin

## 2015-03-02 NOTE — Assessment & Plan Note (Signed)
Check BMP today on medicines

## 2015-03-02 NOTE — Assessment & Plan Note (Signed)
He'll see dermatologist today

## 2015-03-02 NOTE — Patient Instructions (Addendum)
Please do schedule an appointment to see your eye doctor, and have your eyes examined every year Check your feet every night and let me know right away of any sores, infections, numbness, etc. Try to limit sweets, white bread, white rice, white potatoes It is okay for you to not check your fingerstick blood sugars (per [employer] Best Practices) Try to limit saturated fats (bacon, sausage, pepperoni, hamburgers, cheese, etc.) Try to eat no more than three egg yolks per week Return for a complete physical and fasting labs in 3 months You received the flu shot today; it should protect you against the flu virus over the coming months; it will take about two weeks for antibodies to develop; do try to stay away from hospitals, nursing homes, and daycares during peak flu season; taking 1000 mg of vitamin C daily during flu season may help you avoid getting sick   Cholesterol Cholesterol is a white, waxy, fat-like substance needed by your body in small amounts. The liver makes all the cholesterol you need. Cholesterol is carried from the liver by the blood through the blood vessels. Deposits of cholesterol (plaque) may build up on blood vessel walls. These make the arteries narrower and stiffer. Cholesterol plaques increase the risk for heart attack and stroke.  You cannot feel your cholesterol level even if it is very high. The only way to know it is high is with a blood test. Once you know your cholesterol levels, you should keep a record of the test results. Work with your health care provider to keep your levels in the desired range.  WHAT DO THE RESULTS MEAN?  Total cholesterol is a rough measure of all the cholesterol in your blood.   LDL is the so-called bad cholesterol. This is the type that deposits cholesterol in the walls of the arteries. You want this level to be low.   HDL is the good cholesterol because it cleans the arteries and carries the LDL away. You want this  level to be high.  Triglycerides are fat that the body can either burn for energy or store. High levels are closely linked to heart disease.  WHAT ARE THE DESIRED LEVELS OF CHOLESTEROL?  Total cholesterol below 200.   LDL below 100 for people at risk, below 70 for those at very high risk.   HDL above 50 is good, above 60 is best.   Triglycerides below 150.  HOW CAN I LOWER MY CHOLESTEROL?  Diet. Follow your diet programs as directed by your health care provider.   Choose fish or white meat chicken and [place], roasted or baked. Limit fatty cuts of red meat, fried foods, and processed meats, such as sausage and lunch meats.   Eat lots of fresh fruits and vegetables.  Choose whole grains, beans, pasta, potatoes, and cereals.   Use only small amounts of olive, corn, or canola oils.   Avoid butter, mayonnaise, shortening, or palm kernel oils.  Avoid foods with trans fats.   Drink skim or nonfat milk and eat low-fat or nonfat yogurt and cheeses. Avoid whole milk, cream, ice cream, egg yolks, and full-fat cheeses.   Healthy desserts include angel food cake, ginger snaps, animal crackers, hard candy, popsicles, and low-fat or nonfat frozen yogurt. Avoid pastries, cakes, pies, and cookies.   Exercise. Follow your exercise programs as directed by your health care provider.   A regular program helps decrease LDL and raise HDL.   A regular program helps with weight control.  Do things that increase your activity level like gardening, walking, or taking the stairs. Ask your health care provider about how you can be more active in your daily life.   Medicine. Take medicine only as directed by your health care provider.   Medicine may be prescribed by your health care provider to help lower cholesterol and decrease the risk for heart disease.   If you have several risk factors, you may need medicine even if your levels are normal.   This information is not intended  to replace advice given to you by your health care provider. Make sure you discuss any questions you have with your health care provider.   Document Released: 01/24/2001 Document Revised: 05/22/2014 Document Reviewed: 02/12/2013 Elsevier Interactive Patient Education 2016 [employer]. Diabetes Mellitus and Food It is important for you to manage your blood sugar (glucose) level. Your blood glucose level can be greatly affected by what you eat. Eating healthier foods in the appropriate amounts throughout the day at about the same time each day will help you control your blood glucose level. It can also help slow or prevent worsening of your diabetes mellitus. Healthy eating may even help you improve the level of your blood pressure and reach or maintain a healthy weight.  General recommendations for healthful eating and cooking habits include:  Eating meals and snacks regularly. Avoid going long periods of time without eating to lose weight.  Eating a diet that consists mainly of plant-based foods, such as fruits, vegetables, nuts, legumes, and whole grains.  Using low-heat cooking methods, such as baking, instead of high-heat cooking methods, such as deep frying. Work with your dietitian to make sure you understand how to use the Nutrition Facts information on food labels. HOW CAN FOOD AFFECT ME? Carbohydrates Carbohydrates affect your blood glucose level more than any other type of food. Your dietitian will help you determine how many carbohydrates to eat at each meal and teach you how to count carbohydrates. Counting carbohydrates is important to keep your blood glucose at a healthy level, especially if you are using insulin or taking certain medicines for diabetes mellitus. Alcohol Alcohol can cause sudden decreases in blood glucose (hypoglycemia), especially if you use insulin or take certain medicines for diabetes mellitus. Hypoglycemia can be a life-threatening condition. Symptoms of  hypoglycemia (sleepiness, dizziness, and disorientation) are similar to symptoms of having too much alcohol.  If your health care provider has given you approval to drink alcohol, do so in moderation and use the following guidelines:  Women should not have more than one drink per day, and men should not have more than two drinks per day. One drink is equal to:  12 oz of beer.  5 oz of wine.  1 oz of hard liquor.  Do not drink on an empty stomach.  Keep yourself hydrated. Have water, diet soda, or unsweetened iced tea.  Regular soda, juice, and other mixers might contain a lot of carbohydrates and should be counted. WHAT FOODS ARE NOT RECOMMENDED? As you make food choices, it is important to remember that all foods are not the same. Some foods have fewer nutrients per serving than other foods, even though they might have the same number of calories or carbohydrates. It is difficult to get your body what it needs when you eat foods with fewer nutrients. Examples of foods that you should avoid that are high in calories and carbohydrates but low in nutrients include:  Trans fats (most processed foods list  trans fats on the Nutrition Facts label).  Regular soda.  Juice.  Candy.  Sweets, such as cake, pie, doughnuts, and cookies.  Fried foods. WHAT FOODS CAN I EAT? Eat nutrient-rich foods, which will nourish your body and keep you healthy. The food you should eat also will depend on several factors, including:  The calories you need.  The medicines you take.  Your weight.  Your blood glucose level.  Your blood pressure level.  Your cholesterol level. You should eat a variety of foods, including:  Protein.  Lean cuts of meat.  Proteins low in saturated fats, such as fish, egg whites, and beans. Avoid processed meats.  Fruits and vegetables.  Fruits and vegetables that may help control blood glucose levels, such as apples, mangoes, and yams.  Dairy products.  Choose  fat-free or low-fat dairy products, such as milk, yogurt, and cheese.  Grains, bread, pasta, and rice.  Choose whole grain products, such as multigrain bread, whole oats, and brown rice. These foods may help control blood pressure.  Fats.  Foods containing healthful fats, such as nuts, avocado, olive oil, canola oil, and fish. DOES EVERYONE WITH DIABETES MELLITUS HAVE THE SAME MEAL PLAN? Because every person with diabetes mellitus is different, there is not one meal plan that works for everyone. It is very important that you meet with a dietitian who will help you create a meal plan that is just right for you.   This information is not intended to replace advice given to you by your health care provider. Make sure you discuss any questions you have with your health care provider.   Document Released: 01/26/2005 Document Revised: 05/22/2014 Document Reviewed: 03/28/2013 Elsevier Interactive Patient Education [employer].

## 2015-03-02 NOTE — Assessment & Plan Note (Signed)
Control with diet, limit triggers; may use Zantac or Tums

## 2015-03-02 NOTE — Progress Notes (Signed)
BP 133/84 mmHg  Pulse 78  Temp(Src) 98 F (36.7 C)  Ht 5' 11.5" (1.816 m)  Wt 227 lb (102.967 kg)  BMI 31.22 kg/m2  SpO2 98%   Subjective:    Patient ID: Matthew Woodard, male    DOB: 1965-10-09, 49 y.o.   MRN: [id number]  HPI: Matthew Woodard is a 49 y.o. male  Chief Complaint  Patient presents with  . Diabetes  . Hypertension  . Hyperlipidemia   Diabetes; not checking sugars with my blessing; does urinate at night some on the invokana, okay with it; staying hydrated; flu shot today; checking feet sometimes; last eye exam one year ago, due; staying away from sugary drinks  Hypertension; does not check blood pressure at home; does not add salt to food; his sweetie does most of the shopping, not sure if she checks labels  Hyperlipidemia; taking statin; no muscle aches and no abd pain; eats 10 eggs a week; he eats out for breakfast; likes cheese; not much processed fatty stuff; fried bologna sandwich  GERD is not a problem; he did not get the medicine; just uses OTC products occasionally  He goes today to see dermatologist about the rash on his hands; I gave him some cream earlier which has helped; fissures and itching and blisters have improved, but he still has the rash  Also has hx of low vitamin D; took Rx for a while Relevant past medical, surgical, family and social history reviewed and updated as indicated. Interim medical history since our last visit reviewed. Allergies and medications reviewed and updated.  Review of Systems  Respiratory: Negative for shortness of breath.   Cardiovascular: Negative for chest pain.  Skin: Positive for rash (sees dermatologist today).   Per HPI unless specifically indicated above Taking fish oil and aspirin     Objective:    BP 133/84 mmHg  Pulse 78  Temp(Src) 98 F (36.7 C)  Ht 5' 11.5" (1.816 m)  Wt 227 lb (102.967 kg)  BMI 31.22 kg/m2  SpO2 98%  Wt Readings from Last 3 Encounters:  03/02/15 227 lb (102.967 kg)   12/25/14 231 lb (104.781 kg)  12/08/14 234 lb (106.142 kg)    Physical Exam  Constitutional: He appears well-developed and well-nourished. No distress.  HENT:  Head: Normocephalic and atraumatic.  Eyes: EOM are normal. No scleral icterus.  Neck: No thyromegaly present.  Cardiovascular: Normal rate and regular rhythm.   Pulmonary/Chest: Effort normal and breath sounds normal.  Abdominal: Soft. Bowel sounds are normal. He exhibits no distension. There is no tenderness.  Musculoskeletal: He exhibits no edema.  Neurological: He is alert. Coordination normal.  Skin: Skin is warm and dry. Rash (plaques of dry skin on the palms, some erythema; no vesicles; healing pink skin on digits as well) noted. No pallor.  Psychiatric: He has a normal mood and affect. His behavior is normal. Judgment and thought content normal.    Results for orders placed or performed in visit on 11/02/14  Comprehensive metabolic panel  Result Value Ref Range   Glucose 137 (H) 65 - 99 mg/dL   BUN 19 6 - 24 mg/dL   Creatinine, Ser 0.67 (L) 0.76 - 1.27 mg/dL   GFR calc non Af Amer 113 >59 mL/min/1.73   GFR calc Af Amer 130 >59 mL/min/1.73   BUN/Creatinine Ratio 28 (H) 9 - 20   Sodium 141 134 - 144 mmol/L   Potassium 4.7 3.5 - 5.2 mmol/L   Chloride 99  97 - 108 mmol/L   CO2 23 18 - 29 mmol/L   Calcium 9.2 8.7 - 10.2 mg/dL   Total Protein 6.6 6.0 - 8.5 g/dL   Albumin 4.4 3.5 - 5.5 g/dL   Globulin, Total 2.2 1.5 - 4.5 g/dL   Albumin/Globulin Ratio 2.0 1.1 - 2.5   Bilirubin Total 0.4 0.0 - 1.2 mg/dL   Alkaline Phosphatase 68 39 - 117 IU/L   AST 15 0 - 40 IU/L   ALT 20 0 - 44 IU/L  Lipid Panel w/o Chol/HDL Ratio  Result Value Ref Range   Cholesterol, Total 157 100 - 199 mg/dL   Triglycerides 134 0 - 149 mg/dL   HDL 50 >39 mg/dL   VLDL Cholesterol Cal 27 5 - 40 mg/dL   LDL Calculated 80 0 - 99 mg/dL  Hgb A1c w/o eAG  Result Value Ref Range   Hgb A1c MFr Bld 7.9 (H) 4.8 - 5.6 %      Assessment & Plan:    Problem List Items Addressed This Visit      Digestive   Acid reflux    Control with diet, limit triggers; may use Zantac or Tums        Endocrine   Type 2 diabetes mellitus (HCC) - Primary    Check A1C; check feet every night; foot exam by MD today; patient to call to make his own eye appointment; check urine microalbumin      Relevant Orders   Basic metabolic panel   Hgb J6O w/o eAG   Microalbumin / creatinine urine ratio     Musculoskeletal and Integument   Dyshidrotic eczema    He'll see dermatologist today        Other   Hyperlipemia    Cut down on eggs and cheese and fatty meats; try to get more fiber and fruits and veggies; check lipids next time because last lipids were so good; continue statin      Medication monitoring encounter    Check BMP today on medicines      Vitamin D deficiency    Check level today and supplement if needed; may be making dermatitis in the hands worse      Relevant Orders   Vit D  25 hydroxy (rtn osteoporosis monitoring)    Other Visit Diagnoses    Needs flu shot        offered and given today    Encounter for immunization           Follow up plan: Return in about 3 months (around 06/02/2015) for complete physical with fasting labs.  Orders Placed This Encounter  Procedures  . Flu Vaccine QUAD 36+ mos IM  . Basic metabolic panel  . Hgb A1c w/o eAG  . Microalbumin / creatinine urine ratio  . Vit D  25 hydroxy (rtn osteoporosis monitoring)   An after-visit summary was printed and given to the patient at [facility].  Please see the patient instructions which may contain other information and recommendations beyond what is mentioned above in the assessment and plan.

## 2015-03-03 LAB — BASIC METABOLIC PANEL
BUN/Creatinine Ratio: 25 — ABNORMAL HIGH (ref 9–20)
BUN: 18 mg/dL (ref 6–24)
CO2: 23 mmol/L (ref 18–29)
Calcium: 9.6 mg/dL (ref 8.7–10.2)
Chloride: 98 mmol/L (ref 97–106)
Creatinine, Ser: 0.71 mg/dL — ABNORMAL LOW (ref 0.76–1.27)
GFR calc Af Amer: 127 mL/min/{1.73_m2} (ref >59)
GFR calc non Af Amer: 110 mL/min/{1.73_m2} (ref >59)
Glucose: 147 mg/dL — ABNORMAL HIGH (ref 65–99)
Potassium: 4.3 mmol/L (ref 3.5–5.2)
Sodium: 136 mmol/L (ref 136–144)

## 2015-03-03 LAB — MICROALBUMIN / CREATININE URINE RATIO
Creatinine, Urine: 60.9 mg/dL
MICROALB/CREAT RATIO: 11.5 mg/g creat (ref 0.0–30.0)
Microalbumin, Urine: 7 ug/mL

## 2015-03-03 LAB — HGB A1C W/O EAG: Hgb A1c MFr Bld: 9.1 % — ABNORMAL HIGH (ref 4.8–5.6)

## 2015-03-03 LAB — VITAMIN D 25 HYDROXY (VIT D DEFICIENCY, FRACTURES): Vit D, 25-Hydroxy: 36.7 ng/mL (ref 30.0–100.0)

## 2015-03-05 ENCOUNTER — Telehealth: Payer: Self-pay | Admitting: Family Medicine

## 2015-03-05 NOTE — Telephone Encounter (Signed)
I spoke with patient; A1C has gone up; over 9 now; suggested adding a 4th medicine; he does not want to do that; he wants to really buckle down on his diet and see what he can do on his own Urine microalbumin was negative

## 2015-06-01 ENCOUNTER — Encounter: Payer: BLUE CROSS/BLUE SHIELD | Admitting: Family Medicine

## 2015-06-08 ENCOUNTER — Encounter: Payer: Self-pay | Admitting: Family Medicine

## 2015-06-24 ENCOUNTER — Other Ambulatory Visit: Payer: Self-pay | Admitting: Family Medicine

## 2015-06-25 NOTE — Telephone Encounter (Signed)
Last labs reviewed; he no showed last appt; limited (one month) Rx for metformin approved; 90 days of ACE-I sent

## 2015-07-22 ENCOUNTER — Other Ambulatory Visit: Payer: Self-pay | Admitting: Family Medicine

## 2015-08-22 ENCOUNTER — Other Ambulatory Visit: Payer: Self-pay | Admitting: Unknown Physician Specialty

## 2015-08-22 ENCOUNTER — Other Ambulatory Visit: Payer: Self-pay | Admitting: Family Medicine

## 2015-08-23 NOTE — Telephone Encounter (Signed)
This is a Dr. Sanda Klein patient. I see in the patient's chart where Dr. Sanda Klein routed a refill a request to Pricilla Larsson this morning so I will send this to her as well.

## 2015-08-23 NOTE — Telephone Encounter (Signed)
Rx already sent by colleague; thank you

## 2015-08-23 NOTE — Telephone Encounter (Signed)
Needs seen further refills 

## 2015-08-23 NOTE — Telephone Encounter (Signed)
Patient needs at appointment; please call him and ask him to schedule a visit with fasting labs in the next 2-3 weeks please I sent in 30 days to get him through to visit, but we really need to monitor his diabetes Thank you

## 2015-08-23 NOTE — Telephone Encounter (Signed)
Medication refill appointment made for 09-08-15

## 2015-08-23 NOTE — Telephone Encounter (Signed)
Medication refill appointment scheduled for 09-08-15

## 2015-09-08 ENCOUNTER — Other Ambulatory Visit
Admission: RE | Admit: 2015-09-08 | Discharge: 2015-09-08 | Disposition: A | Discharge status: Home / Self Care | Payer: BLUE CROSS/BLUE SHIELD | Source: Ambulatory Visit | Attending: Family Medicine | Admitting: Family Medicine

## 2015-09-08 ENCOUNTER — Ambulatory Visit (INDEPENDENT_AMBULATORY_CARE_PROVIDER_SITE_OTHER): Payer: BLUE CROSS/BLUE SHIELD | Admitting: Family Medicine

## 2015-09-08 ENCOUNTER — Encounter: Payer: Self-pay | Admitting: Family Medicine

## 2015-09-08 VITALS — BP 122/68 | HR 86 | Temp 98.1°F | Resp 16 | Ht 72.0 in | Wt 235.0 lb

## 2015-09-08 DIAGNOSIS — E785 Hyperlipidemia, unspecified: Secondary | ICD-10-CM | POA: Diagnosis not present

## 2015-09-08 DIAGNOSIS — E1165 Type 2 diabetes mellitus with hyperglycemia: Secondary | ICD-10-CM | POA: Insufficient documentation

## 2015-09-08 DIAGNOSIS — Z5181 Encounter for therapeutic drug level monitoring: Secondary | ICD-10-CM

## 2015-09-08 DIAGNOSIS — E669 Obesity, unspecified: Secondary | ICD-10-CM | POA: Diagnosis not present

## 2015-09-08 DIAGNOSIS — E559 Vitamin D deficiency, unspecified: Secondary | ICD-10-CM

## 2015-09-08 LAB — COMPREHENSIVE METABOLIC PANEL WITH GFR
ALT: 22 U/L (ref 17–63)
AST: 19 U/L (ref 15–41)
Albumin: 4.3 g/dL (ref 3.5–5.0)
Alkaline Phosphatase: 55 U/L (ref 38–126)
Anion gap: 8 (ref 5–15)
BUN: 19 mg/dL (ref 6–20)
CO2: 26 mmol/L (ref 22–32)
Calcium: 9.3 mg/dL (ref 8.9–10.3)
Chloride: 105 mmol/L (ref 101–111)
Creatinine, Ser: 0.76 mg/dL (ref 0.61–1.24)
GFR calc Af Amer: >60 mL/min (ref >60)
GFR calc non Af Amer: >60 mL/min (ref >60)
Glucose, Bld: 121 mg/dL — ABNORMAL HIGH (ref 65–99)
Potassium: 4.1 mmol/L (ref 3.5–5.1)
Sodium: 139 mmol/L (ref 135–145)
Total Bilirubin: 0.7 mg/dL (ref 0.3–1.2)
Total Protein: 7.1 g/dL (ref 6.5–8.1)

## 2015-09-08 LAB — LIPID PANEL
Cholesterol: 156 mg/dL (ref 0–200)
HDL: 48 mg/dL (ref >40)
LDL Cholesterol: 87 mg/dL (ref 0–99)
Total CHOL/HDL Ratio: 3.3 RATIO
Triglycerides: 104 mg/dL (ref <150)
VLDL: 21 mg/dL (ref 0–40)

## 2015-09-08 LAB — POCT GLYCOSYLATED HEMOGLOBIN (HGB A1C): Hemoglobin A1C: 8.5

## 2015-09-08 MED ORDER — CLOBETASOL PROPIONATE 0.05 % EX OINT: 1.0000 "application " | TOPICAL_OINTMENT | Freq: Two times a day (BID) | CUTANEOUS | Status: DC

## 2015-09-08 NOTE — Assessment & Plan Note (Signed)
Normal last check, not rechecking today

## 2015-09-08 NOTE — Assessment & Plan Note (Signed)
Check sgpt and Cr and K+ 

## 2015-09-08 NOTE — Assessment & Plan Note (Signed)
Encouraged patient to lose weight; we'll shoot for 10-20 pounds over the coming months

## 2015-09-08 NOTE — Progress Notes (Signed)
BP 122/68 mmHg  Pulse 86  Temp(Src) 98.1 F (36.7 C) (Oral)  Resp 16  Ht 6' (1.829 m)  Wt 235 lb (106.595 kg)  BMI 31.86 kg/m2  SpO2 97%   Subjective:    Patient ID: Matthew Woodard, male    DOB: 1965/11/02, 50 y.o.   MRN: [id number]  HPI: ROYAL KWIECIEN Woodard is a 50 y.o. male  Chief Complaint  Patient presents with  . Medication Refill   Patient is here for follow-up  He has type 2 diabetes; on Jaunvia and metformin and invokana; urinate and drink more, just once at night; normal urine microalbumin:Cr test October 18th; he is hoping his A1c will be better today, but he has gained weight  He has high cholesterol; on statin; last LDL 80  He has HTN; on ACE-I; controlled today; no reported side effects  Hx of vit D deficiency, but last time it was checked, it was normal  Depression screen [facility] 2/9 09/08/2015  Decreased Interest 0  Down, Depressed, Hopeless 0  PHQ - 2 Score 0   Relevant past medical, surgical, family and social history reviewed and updated as indicated. Past Medical History  Diagnosis Date  . Hyperlipidemia   . Hypertension   . Diabetes mellitus without complication [facility])    Past Surgical History  Procedure Laterality Date  . Knee surgery    . Varicocele excision    . Nasal sinus surgery  03/15  . Cyst excision  Aug. 2016    on back   Family History  Problem Relation Age of Onset  . Heart disease Father   . Hypertension Father   . COPD Father    Social History  Substance Use Topics  . Smoking status: Former Smoker    Types: Cigarettes    Quit date: 06/15/2013  . Smokeless tobacco: Never Used  . Alcohol Use: No   Interim medical history since our last visit reviewed. Allergies and medications reviewed and updated.  Review of Systems  Respiratory: Negative for shortness of breath.   Cardiovascular: Negative for chest pain.  Neurological: Negative for numbness.   Per HPI unless specifically indicated above     Objective:    BP  122/68 mmHg  Pulse 86  Temp(Src) 98.1 F (36.7 C) (Oral)  Resp 16  Ht 6' (1.829 m)  Wt 235 lb (106.595 kg)  BMI 31.86 kg/m2  SpO2 97%  Wt Readings from Last 3 Encounters:  09/08/15 235 lb (106.595 kg)  03/02/15 227 lb (102.967 kg)  12/25/14 231 lb (104.781 kg)    Physical Exam  Constitutional: He appears well-developed and well-nourished. No distress.  Weight gain 8 pounds over last 6 months  HENT:  Head: Normocephalic and atraumatic.  Eyes: EOM are normal. No scleral icterus.  Neck: No thyromegaly present.  Cardiovascular: Normal rate and regular rhythm.   Pulmonary/Chest: Effort normal and breath sounds normal.  Abdominal: Soft. Bowel sounds are normal. He exhibits no distension.  Musculoskeletal: He exhibits no edema.  Neurological: Coordination normal.  Skin: Skin is warm and dry. No pallor.  Psychiatric: He has a normal mood and affect. His behavior is normal. Judgment and thought content normal.   Diabetic Foot Form - Detailed   Diabetic Foot Exam - detailed  Diabetic Foot exam was performed with the following findings:  Yes   Visual Foot Exam completed.:  Yes  Are the toenails long?:  No  Are the toenails thick?:  No  Are the toenails ingrown?:  No  Normal Range of Motion:  Yes    Pulse Foot Exam completed.:  Yes  Right Dorsalis Pedis:  Present Left Dorsalis Pedis:  Present  Sensory Foot Exam Completed.:  Yes  Semmes-Weinstein Monofilament Test  R Site 1-Great Toe:  Pos L Site 1-Great Toe:  Pos  R Site 4:  Pos L Site 4:  Pos  R Site 5:  Pos L Site 5:  Pos       Results for orders placed or performed in visit on 09/08/15  POCT HgB A1C  Result Value Ref Range   Hemoglobin A1C 8.5       Assessment & Plan:   Problem List Items Addressed This Visit      Endocrine   Type 2 diabetes mellitus ([place]) - Primary    Aspirin daily; foot exam by MD today; check feet at home nightly; pt encouraged to call to make eye appt; he agrees to go soon; we discussed that his  A1c has improved, but is not to goal; he is on max dose of three oral agents; discussed options, and he wants to buckle down on weight loss and better eating, recheck in 3 months and go from there; next step may be fourth oral agent or injectable      Relevant Orders   POCT HgB A1C (Completed)     Other   Hyperlipemia    Check lipids today; goal LDL under 100      Relevant Orders   Lipid Panel w/o Chol/HDL Ratio   Medication monitoring encounter    Check sgpt and Cr and K+      Relevant Orders   Comprehensive metabolic panel   Vitamin D deficiency    Normal last check, not rechecking today      Obesity    Encouraged patient to lose weight; we'll shoot for 10-20 pounds over the coming months         Follow up plan: Return in about 3 months (around 12/08/2015) for visit and fasting labs; complete physical in next 6 weeks.  An after-visit summary was printed and given to the patient at [place].  Please see the patient instructions which may contain other information and recommendations beyond what is mentioned above in the assessment and plan.  Meds ordered this encounter  Medications  . clobetasol ointment (TEMOVATE) 0.05 %    Sig: Apply 1 application topically 2 (two) times daily. If needed    Dispense:  30 g    Refill:  2   Orders Placed This Encounter  Procedures  . Comprehensive metabolic panel  . Lipid Panel w/o Chol/HDL Ratio  . POCT HgB A1C

## 2015-09-08 NOTE — Assessment & Plan Note (Signed)
Check lipids today; goal LDL under 100

## 2015-09-08 NOTE — Patient Instructions (Addendum)
Try to limit saturated fats in your diet (bologna, hot dogs, barbeque, cheeseburgers, hamburgers, steak, bacon, sausage, cheese, etc.) and get more fresh fruits, vegetables, and whole grains Please do see your eye doctor regularly, and have your eyes examined every year (or more often per his or her recommendation) Check your feet every night and let me know right away of any sores, infections, numbness, etc. Try to limit sweets, white bread, white rice, white potatoes It is okay with me for you to not check your fingerstick blood sugars (per [employer] Best Practices), unless you are interested and feel it would be helpful for you Try to lose 10-20 pounds over the next few months Return in 3 months

## 2015-09-08 NOTE — Assessment & Plan Note (Addendum)
Aspirin daily; foot exam by MD today; check feet at home nightly; pt encouraged to call to make eye appt; he agrees to go soon; we discussed that his A1c has improved, but is not to goal; he is on max dose of three oral agents; discussed options, and he wants to buckle down on weight loss and better eating, recheck in 3 months and go from there; next step may be fourth oral agent or injectable

## 2015-09-20 ENCOUNTER — Other Ambulatory Visit: Payer: Self-pay | Admitting: Unknown Physician Specialty

## 2015-09-20 ENCOUNTER — Other Ambulatory Visit: Payer: Self-pay | Admitting: Family Medicine

## 2015-09-20 NOTE — Telephone Encounter (Signed)
Last Cr reviewed; Rx approved 

## 2015-09-20 NOTE — Telephone Encounter (Signed)
Last labs reviewed; Rxs approved 

## 2015-09-20 NOTE — Telephone Encounter (Signed)
Your patient.  Thanks 

## 2015-10-12 ENCOUNTER — Encounter: Payer: Self-pay | Admitting: Family Medicine

## 2015-10-12 ENCOUNTER — Ambulatory Visit (INDEPENDENT_AMBULATORY_CARE_PROVIDER_SITE_OTHER): Payer: BLUE CROSS/BLUE SHIELD | Admitting: Family Medicine

## 2015-10-12 VITALS — BP 120/62 | HR 91 | Temp 98.2°F | Resp 16 | Wt 229.0 lb

## 2015-10-12 DIAGNOSIS — Z0000 Encounter for general adult medical examination without abnormal findings: Secondary | ICD-10-CM | POA: Diagnosis not present

## 2015-10-12 DIAGNOSIS — Z1211 Encounter for screening for malignant neoplasm of colon: Secondary | ICD-10-CM

## 2015-10-12 NOTE — Assessment & Plan Note (Signed)
USPSTF grade A and B recommendations reviewed with patient; age-appropriate recommendations, preventive care, screening tests, etc discussed and encouraged; healthy living encouraged; see AVS for patient education given to patient; discussed PSA controversy; no fam hx, no sx, declined testing

## 2015-10-12 NOTE — Assessment & Plan Note (Signed)
Refer for colonoscopy 

## 2015-10-12 NOTE — Progress Notes (Signed)
Patient ID: Matthew Woodard, male   DOB: 1965-06-29, 50 y.o.   MRN: [id number]   Subjective:   Matthew Woodard is a 50 y.o. male here for a complete physical exam  Interim issues since last visit:  USPSTF grade A and B recommendations Alcohol: very rarely Depression:  Depression screen [facility] 2/9 10/12/2015 09/08/2015  Decreased Interest 0 0  Down, Depressed, Hopeless 0 0  PHQ - 2 Score 0 0  Hypertension: well-controlled Obesity: losing weight; trouble eating with new dentures Tobacco use: former smoker; quit 2.5 years ago HIV: politely declined STD testing and prevention (chl/gon/syphilis): asx Lipids: UTD Glucose: improved Colorectal cancer: order colonoscopy  Breast cancer: no lumps; no fam hx Lung cancer: at 55, we'll discuss Osteoporosis: no steroids Aspirin: daily 81mg  Diet: tries to eat good diet Exercise: active through the day, no extra exercise, 10-12 hours; not at a desk Skin cancer: no worrisome moles; had one precancerous on scalp, frozen by derm   Past Medical History  Diagnosis Date  . Hyperlipidemia   . Hypertension   . Diabetes mellitus without complication [facility])    Past Surgical History  Procedure Laterality Date  . Knee surgery    . Varicocele excision    . Nasal sinus surgery  03/15  . Cyst excision  Aug. 2016    on back  . Tooth abstraction     Family History  Problem Relation Age of Onset  . Heart disease Father   . Hypertension Father   . COPD Father    Social History  Substance Use Topics  . Smoking status: Former Smoker    Types: Cigarettes    Quit date: 06/15/2013  . Smokeless tobacco: Never Used  . Alcohol Use: No   Review of Systems  Constitutional: Negative for unexpected weight change.  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   Respiratory: Positive for cough (postnasal drip; used to have bad smokers cough but that stopped).  Negative for shortness of breath.   Cardiovascular: Negative for chest pain and leg swelling.  Genitourinary: Negative for hematuria, decreased urine volume and difficulty urinating.  Musculoskeletal: Positive for joint swelling (fingers).  Allergic/Immunologic: Negative for food allergies.  Neurological: Negative for tremors.  Hematological: Does not bruise/bleed easily.  Psychiatric/Behavioral: Negative for dysphoric mood.    Objective:   Filed Vitals:   10/12/15 0832  BP: 120/62  Pulse: 91  Temp: 98.2 F (36.8 C)  TempSrc: Oral  Resp: 16  Weight: 229 lb (103.874 kg)  SpO2: 97%   Body mass index is 31.05 kg/(m^2). Wt Readings from Last 3 Encounters:  10/12/15 229 lb (103.874 kg)  09/08/15 235 lb (106.595 kg)  03/02/15 227 lb (102.967 kg)   Physical Exam  Constitutional: He appears well-developed and well-nourished. No distress.  HENT:  Head: Normocephalic and atraumatic.  Nose: Nose normal.  Mouth/Throat: Oropharynx is clear and moist. He does not have dentures.  Complete upper dental extraction (2 weeks ago); healing appropriately, no drainage or erythema  Eyes: EOM are normal. No scleral icterus.  Neck: No JVD present. No thyromegaly present.  Cardiovascular: Normal rate, regular rhythm and normal heart sounds.   Pulmonary/Chest: Effort normal and breath sounds normal. No respiratory distress. He has no wheezes. He has no rales.  Abdominal: Soft. Bowel sounds are normal. He exhibits no distension. There is no tenderness. There is no guarding.  Musculoskeletal: Normal  range of motion. He exhibits no edema.  Lymphadenopathy:    He has no cervical adenopathy.  Neurological: He is alert. He displays normal reflexes. He exhibits normal muscle tone. Coordination normal.  Skin: Skin is warm and dry. No rash noted. He is not diaphoretic. No erythema. No pallor.  Inclusion cyst medial right shoulder/base of neck; tanned with freckles  Psychiatric: He has a normal mood and  affect. His behavior is normal. Judgment and thought content normal.   Diabetic Foot Form - Detailed   Diabetic Foot Exam - detailed  Diabetic Foot exam was performed with the following findings:  Yes 10/12/2015  9:15 AM  Visual Foot Exam completed.:  Yes  Are the toenails thick?:  No  Do you have pain in calf while walking?:  No  Are the toenails ingrown?:  No  Normal Range of Motion:  Yes    Pulse Foot Exam completed.:  Yes  Right Dorsalis Pedis:  Present Left Dorsalis Pedis:  Present  Sensory Foot Exam Completed.:  Yes  Semmes-Weinstein Monofilament Test  R Site 1-Great Toe:  Pos L Site 1-Great Toe:  Pos  R Site 4:  Pos L Site 4:  Pos  R Site 5:  Pos L Site 5:  Pos    Comments:  Mild scale suggestive of athlete's foot (use topical)       Assessment/Plan:   Problem List Items Addressed This Visit      Other   Colon cancer screening - Primary    Refer for colonoscopy      Relevant Orders   Ambulatory referral to Gastroenterology   Preventative health care    USPSTF grade A and B recommendations reviewed with patient; age-appropriate recommendations, preventive care, screening tests, etc discussed and encouraged; healthy living encouraged; see AVS for patient education given to patient; discussed PSA controversy; no fam hx, no sx, declined testing         Meds ordered this encounter  Medications  . aspirin EC 81 MG tablet    Sig: Take 1 tablet (81 mg total) by mouth daily.   Orders Placed This Encounter  Procedures  . Ambulatory referral to Gastroenterology    Referral Priority:  Routine    Referral Type:  Consultation    Referral Reason:  Specialty Services Required    Number of Visits Requested:  1    Follow up plan: Return in about 1 year (around 10/11/2016) for complete physical.  An After Visit Summary was printed and given to the patient.

## 2015-10-12 NOTE — Patient Instructions (Addendum)
We'll have you see the gastroenterologist about getting a colonscopy done Try to limit saturated fats in your diet (bologna, hot dogs, barbeque, cheeseburgers, hamburgers, steak, bacon, sausage, cheese, etc.) and get more fresh fruits, vegetables, and whole grains  Health Maintenance, Male A healthy lifestyle and preventative care can promote health and wellness.  Maintain regular health, dental, and eye exams.  Eat a healthy diet. Foods like vegetables, fruits, whole grains, low-fat dairy products, and lean protein foods contain the nutrients you need and are low in calories. Decrease your intake of foods high in solid fats, added sugars, and salt. Get information about a proper diet from your health care provider, if necessary.  Regular physical exercise is one of the most important things you can do for your health. Most adults should get at least 150 minutes of moderate-intensity exercise (any activity that increases your heart rate and causes you to sweat) each week. In addition, most adults need muscle-strengthening exercises on 2 or more days a week.   Maintain a healthy weight. The body mass index (BMI) is a screening tool to identify possible weight problems. It provides an estimate of body fat based on height and weight. Your health care provider can find your BMI and can help you achieve or maintain a healthy weight. For males 20 years and older:  A BMI below 18.5 is considered underweight.  A BMI of 18.5 to 24.9 is normal.  A BMI of 25 to 29.9 is considered overweight.  A BMI of 30 and above is considered obese.  Maintain normal blood lipids and cholesterol by exercising and minimizing your intake of saturated fat. Eat a balanced diet with plenty of fruits and vegetables. Blood tests for lipids and cholesterol should begin at age 20 and be repeated every 5 years. If your lipid or cholesterol levels are high, you are over age 97, or you are at high risk for heart disease, you may  need your cholesterol levels checked more frequently.Ongoing high lipid and cholesterol levels should be treated with medicines if diet and exercise are not working.  If you smoke, find out from your health care provider how to quit. If you do not use tobacco, do not start.  Lung cancer screening is recommended for adults aged 35-80 years who are at high risk for developing lung cancer because of a history of smoking. A yearly low-dose CT scan of the lungs is recommended for people who have at least a 30-pack-year history of smoking and are current smokers or have quit within the past 15 years. A pack year of smoking is smoking an average of 1 pack of cigarettes a day for 1 year (for example, a 30-pack-year history of smoking could mean smoking 1 pack a day for 30 years or 2 packs a day for 15 years). Yearly screening should continue until the smoker has stopped smoking for at least 15 years. Yearly screening should be stopped for people who develop a health problem that would prevent them from having lung cancer treatment.  If you choose to drink alcohol, do not have more than 2 drinks per day. One drink is considered to be 12 oz (360 mL) of beer, 5 oz (150 mL) of wine, or 1.5 oz (45 mL) of liquor.  Avoid the use of street drugs. Do not share needles with anyone. Ask for help if you need support or instructions about stopping the use of drugs.  High blood pressure causes heart disease and increases the risk of  stroke. High blood pressure is more likely to develop in:  People who have blood pressure in the end of the normal range (100-139/85-89 mm Hg).  People who are overweight or obese.  People who are African American.  If you are 34-43 years of age, have your blood pressure checked every 3-5 years. If you are 66 years of age or older, have your blood pressure checked every year. You should have your blood pressure measured twice--once when you are at a hospital or clinic, and once when you are  not at a hospital or clinic. Record the average of the two measurements. To check your blood pressure when you are not at a hospital or clinic, you can use:  An automated blood pressure machine at a pharmacy.  A home blood pressure monitor.  If you are 69-89 years old, ask your health care provider if you should take aspirin to prevent heart disease.  Diabetes screening involves taking a blood sample to check your fasting blood sugar level. This should be done once every 3 years after age 63 if you are at a normal weight and without risk factors for diabetes. Testing should be considered at a younger age or be carried out more frequently if you are overweight and have at least 1 risk factor for diabetes.  Colorectal cancer can be detected and often prevented. Most routine colorectal cancer screening begins at the age of 64 and continues through age 33. However, your health care provider may recommend screening at an earlier age if you have risk factors for colon cancer. On a yearly basis, your health care provider may provide home test kits to check for hidden blood in the stool. A small camera at the end of a tube may be used to directly examine the colon (sigmoidoscopy or colonoscopy) to detect the earliest forms of colorectal cancer. Talk to your health care provider about this at age 51 when routine screening begins. A direct exam of the colon should be repeated every 5-10 years through age 67, unless early forms of precancerous polyps or small growths are found.  People who are at an increased risk for hepatitis B should be screened for this virus. You are considered at high risk for hepatitis B if:  You were born in a country where hepatitis B occurs often. Talk with your health care provider about which countries are considered high risk.  Your parents were born in a high-risk country and you have not received a shot to protect against hepatitis B (hepatitis B vaccine).  You have HIV or  AIDS.  You use needles to inject street drugs.  You live with, or have sex with, someone who has hepatitis B.  You are a man who has sex with other men (MSM).  You get hemodialysis treatment.  You take certain medicines for conditions like cancer, organ transplantation, and autoimmune conditions.  Hepatitis C blood testing is recommended for all people born from [birth year] through 1966 and any individual with known risk factors for hepatitis C.  Healthy men should no longer receive prostate-specific antigen (PSA) blood tests as part of routine cancer screening. Talk to your health care provider about prostate cancer screening.  Testicular cancer screening is not recommended for adolescents or adult males who have no symptoms. Screening includes self-exam, a health care provider exam, and other screening tests. Consult with your health care provider about any symptoms you have or any concerns you have about testicular cancer.  Practice safe sex. Use  condoms and avoid high-risk sexual practices to reduce the spread of sexually transmitted infections (STIs).  You should be screened for STIs, including gonorrhea and chlamydia if:  You are sexually active and are younger than 24 years.  You are older than 24 years, and your health care provider tells you that you are at risk for this type of infection.  Your sexual activity has changed since you were last screened, and you are at an increased risk for chlamydia or gonorrhea. Ask your health care provider if you are at risk.  If you are at risk of being infected with HIV, it is recommended that you take a prescription medicine daily to prevent HIV infection. This is called pre-exposure prophylaxis (PrEP). You are considered at risk if:  You are a man who has sex with other men (MSM).  You are a heterosexual man who is sexually active with multiple partners.  You take drugs by injection.  You are sexually active with a partner who has  HIV.  Talk with your health care provider about whether you are at high risk of being infected with HIV. If you choose to begin PrEP, you should first be tested for HIV. You should then be tested every 3 months for as long as you are taking PrEP.  Use sunscreen. Apply sunscreen liberally and repeatedly throughout the day. You should seek shade when your shadow is shorter than you. Protect yourself by wearing long sleeves, pants, a wide-brimmed hat, and sunglasses year round whenever you are outdoors.  Tell your health care provider of new moles or changes in moles, especially if there is a change in shape or color. Also, tell your health care provider if a mole is larger than the size of a pencil eraser.  A one-time screening for abdominal aortic aneurysm (AAA) and surgical repair of large AAAs by ultrasound is recommended for men aged 18-75 years who are current or former smokers.  Stay current with your vaccines (immunizations).   This information is not intended to replace advice given to you by your health care provider. Make sure you discuss any questions you have with your health care provider.   Document Released: 10/28/2007 Document Revised: 05/22/2014 Document Reviewed: 09/26/2010 Elsevier Interactive Patient Education [employer].

## 2015-10-21 ENCOUNTER — Other Ambulatory Visit: Payer: Self-pay | Admitting: Unknown Physician Specialty

## 2015-10-22 NOTE — Telephone Encounter (Signed)
April labs reivewed; Rx approved

## 2015-10-22 NOTE — Telephone Encounter (Signed)
Your patient.  Thanks 

## 2015-10-28 NOTE — Telephone Encounter (Signed)
Error

## 2015-12-08 ENCOUNTER — Ambulatory Visit: Payer: BLUE CROSS/BLUE SHIELD | Admitting: Family Medicine

## 2015-12-30 ENCOUNTER — Other Ambulatory Visit: Payer: Self-pay | Admitting: Family Medicine

## 2016-01-01 NOTE — Telephone Encounter (Signed)
Normal Cr; Rx approved 

## 2016-02-11 LAB — HM DIABETES EYE EXAM

## 2016-02-12 ENCOUNTER — Encounter: Payer: Self-pay | Admitting: Family Medicine

## 2016-02-12 DIAGNOSIS — E113293 Type 2 diabetes mellitus with mild nonproliferative diabetic retinopathy without macular edema, bilateral: Secondary | ICD-10-CM | POA: Insufficient documentation

## 2016-04-02 ENCOUNTER — Other Ambulatory Visit: Payer: Self-pay | Admitting: Family Medicine

## 2016-04-03 ENCOUNTER — Other Ambulatory Visit: Payer: Self-pay | Admitting: Family Medicine

## 2016-04-03 NOTE — Telephone Encounter (Signed)
Please ask pt to schedule first available for visit and fasting labs; thank you (I did send refills as requested)

## 2016-04-03 NOTE — Telephone Encounter (Signed)
Pt states he will have to call back to schedule an appt. 

## 2016-04-03 NOTE — Telephone Encounter (Signed)
One month approved; appt and labs needed

## 2016-04-10 ENCOUNTER — Telehealth: Payer: Self-pay | Admitting: Family Medicine

## 2016-04-10 NOTE — Telephone Encounter (Signed)
Pharmacy notified.

## 2016-04-10 NOTE — Telephone Encounter (Signed)
Pt said that the Dr called in his medications last week and he knows he is to sch an appt and he did, but says that all his medications were called in except his Metformin. He has been out of his Metformin for 4 days.

## 2016-05-05 ENCOUNTER — Ambulatory Visit: Payer: BLUE CROSS/BLUE SHIELD | Admitting: Family Medicine

## 2016-05-16 ENCOUNTER — Other Ambulatory Visit: Payer: Self-pay | Admitting: Family Medicine

## 2016-05-16 NOTE — Telephone Encounter (Signed)
Glitch in computer system; I was not getting refill requests; addressing today ------------------------ Due for visit and labs; one month of all approved

## 2016-06-08 ENCOUNTER — Ambulatory Visit (INDEPENDENT_AMBULATORY_CARE_PROVIDER_SITE_OTHER): Payer: BLUE CROSS/BLUE SHIELD | Admitting: Family Medicine

## 2016-06-08 ENCOUNTER — Encounter: Payer: Self-pay | Admitting: Family Medicine

## 2016-06-08 DIAGNOSIS — E785 Hyperlipidemia, unspecified: Secondary | ICD-10-CM

## 2016-06-08 DIAGNOSIS — E1165 Type 2 diabetes mellitus with hyperglycemia: Secondary | ICD-10-CM

## 2016-06-08 DIAGNOSIS — E113293 Type 2 diabetes mellitus with mild nonproliferative diabetic retinopathy without macular edema, bilateral: Secondary | ICD-10-CM

## 2016-06-08 DIAGNOSIS — Z5181 Encounter for therapeutic drug level monitoring: Secondary | ICD-10-CM

## 2016-06-08 LAB — COMPLETE METABOLIC PANEL WITH GFR
ALT: 20 U/L (ref 9–46)
AST: 19 U/L (ref 10–35)
Albumin: 4.4 g/dL (ref 3.6–5.1)
Alkaline Phosphatase: 61 U/L (ref 40–115)
BUN: 20 mg/dL (ref 7–25)
CO2: 22 mmol/L (ref 20–31)
Calcium: 9.5 mg/dL (ref 8.6–10.3)
Chloride: 103 mmol/L (ref 98–110)
Creat: 0.64 mg/dL — ABNORMAL LOW (ref 0.70–1.33)
GFR, Est African American: >89 mL/min (ref >=60)
GFR, Est Non African American: >89 mL/min (ref >=60)
Glucose, Bld: 152 mg/dL — ABNORMAL HIGH (ref 65–99)
Potassium: 4.5 mmol/L (ref 3.5–5.3)
Sodium: 137 mmol/L (ref 135–146)
Total Bilirubin: 0.5 mg/dL (ref 0.2–1.2)
Total Protein: 6.9 g/dL (ref 6.1–8.1)

## 2016-06-08 LAB — LIPID PANEL
Cholesterol: 165 mg/dL (ref <200)
HDL: 53 mg/dL (ref >40)
LDL Cholesterol: 85 mg/dL (ref <100)
Total CHOL/HDL Ratio: 3.1 Ratio (ref <5.0)
Triglycerides: 137 mg/dL (ref <150)
VLDL: 27 mg/dL (ref <30)

## 2016-06-08 NOTE — Assessment & Plan Note (Addendum)
Check A1c today; foot exam by MD today; encoruaged him to limit sweets; eye exam UTD

## 2016-06-08 NOTE — Assessment & Plan Note (Signed)
Check labs 

## 2016-06-08 NOTE — Assessment & Plan Note (Signed)
Keep f/u with eye doctor; encouraged healthy diet, limit sweets; check A1c

## 2016-06-08 NOTE — Patient Instructions (Signed)
Please do see your eye doctor regularly, and have your eyes examined every year (or more often per his or her recommendation) Check your feet every night and let me know right away of any sores, infections, numbness, etc. Try to limit sweets, white bread, white rice, white potatoes It is okay with me for you to not check your fingerstick blood sugars (per [employer] of Endocrinology Best Practices), unless you are interested and feel it would be helpful for you Stay active Try to limit saturated fats in your diet (bologna, hot dogs, barbeque, cheeseburgers, hamburgers, steak, bacon, sausage, cheese, etc.) and get more fresh fruits, vegetables, and whole grains We'll get labs today If A1c is less than 7, I can see you back in 6 months, otherwise I'll see you in 3 months

## 2016-06-08 NOTE — Progress Notes (Signed)
BP (!) 112/58   Pulse 95   Temp 98.6 F (37 C) (Oral)   Resp 14   Wt 228 lb 9.6 oz (103.7 kg)   SpO2 96%   BMI 31.00 kg/m    Subjective:    Patient ID: Matthew Woodard, male    DOB: 07-Nov-1965, 51 y.o.   MRN: [id number]  HPI: MYRICK GRIFFO Woodard is a 51 y.o. male  Chief Complaint  Patient presents with  . Follow-up   Patient is here for f/u He has type 2 diabetes mellitus Not checking FSBS Not much dry mouth, no blurrred vision; eye exam UTD; no problems with feet Drinks diet [place] Likes to get swiss roll; has to eat something soft; going to get more teeth pulled today Active work; works 10+ job  High cholesterol; on statin; room for improvement in diet; no side effects  BP excellent control; has been stressed this morning  He is going to have teeth pulled today; hoping to get upper dentures and partial and will be able to expand his diet a little more  Next week will mark the three year anniversary of him quitting smoking  Depression screen [facility] 2/9 06/08/2016 10/12/2015 09/08/2015  Decreased Interest 0 0 0  Down, Depressed, Hopeless 0 0 0  PHQ - 2 Score 0 0 0   Relevant past medical, surgical, family and social history reviewed Past Medical History:  Diagnosis Date  . Diabetes mellitus without complication ([place])   . Hyperlipidemia LDL goal <100 11/02/2014  . Hypertension    Past Surgical History:  Procedure Laterality Date  . CYST EXCISION  Aug. 2016   on back  . KNEE SURGERY    . NASAL SINUS SURGERY  03/15  . tooth abstraction    . VARICOCELE EXCISION     Family History  Problem Relation Age of Onset  . Heart disease Father   . Hypertension Father   . COPD Father    Social History  Substance Use Topics  . Smoking status: Former Smoker    Types: Cigarettes    Quit date: 06/15/2013  . Smokeless tobacco: Never Used  . Alcohol use No   Interim medical history since last visit reviewed. Allergies and medications reviewed  Review of Systems Per  HPI unless specifically indicated above     Objective:    BP (!) 112/58   Pulse 95   Temp 98.6 F (37 C) (Oral)   Resp 14   Wt 228 lb 9.6 oz (103.7 kg)   SpO2 96%   BMI 31.00 kg/m   Wt Readings from Last 3 Encounters:  06/08/16 228 lb 9.6 oz (103.7 kg)  10/12/15 229 lb (103.9 kg)  09/08/15 235 lb (106.6 kg)    Physical Exam  Constitutional: He appears well-developed and well-nourished. No distress.  HENT:  Head: Normocephalic and atraumatic.  Nose: Nose normal.  Mouth/Throat: Oropharynx is clear and moist. He does not have dentures.  Complete upper dental extraction (2 weeks ago); healing appropriately, no drainage or erythema  Eyes: EOM are normal. No scleral icterus.  Neck: No JVD present. No thyromegaly present.  Cardiovascular: Normal rate, regular rhythm and normal heart sounds.   Pulmonary/Chest: Effort normal and breath sounds normal. No respiratory distress. He has no wheezes. He has no rales.  Abdominal: Soft. Bowel sounds are normal. He exhibits no distension. There is no tenderness.  Musculoskeletal: Normal range of motion. He exhibits no edema.  Neurological: He is alert. He exhibits  normal muscle tone. Coordination normal.  Skin: Skin is warm and dry. He is not diaphoretic. No erythema. No pallor.  Inclusion cyst medial right shoulder/base of neck; tanned with freckles  Psychiatric: He has a normal mood and affect. His behavior is normal. Judgment and thought content normal.   Diabetic Foot Form - Detailed   Diabetic Foot Exam - detailed Diabetic Foot exam was performed with the following findings:  Yes 06/08/2016  8:58 AM  Visual Foot Exam completed.:  Yes  Are the toenails ingrown?:  No Normal Range of Motion:  Yes Pulse Foot Exam completed.:  Yes  Right Dorsalis Pedis:  Present Left Dorsalis Pedis:  Present  Swelling:  No Semmes-Weinstein Monofilament Test R Site 1-Great Toe:  Pos L Site 1-Great Toe:  Pos  R Site 4:  Pos L Site 4:  Pos  R Site 5:  Pos L  Site 5:  Pos        Results for orders placed or performed in visit on 02/14/16  HM DIABETES EYE EXAM  Result Value Ref Range   HM Diabetic Eye Exam Retinopathy (A) No Retinopathy      Assessment & Plan:   Problem List Items Addressed This Visit      Endocrine   Type 2 diabetes mellitus (HCC)    Check A1c today; foot exam by MD today; encoruaged him to limit sweets; eye exam UTD      Relevant Orders   Hemoglobin A1c   Microalbumin / creatinine urine ratio   Nonproliferative diabetic retinopathy of both eyes ([place])    Keep f/u with eye doctor; encouraged healthy diet, limit sweets; check A1c        Other   Medication monitoring encounter    Check labs      Relevant Orders   COMPLETE METABOLIC PANEL WITH GFR   Hyperlipidemia LDL goal <100    Check lipids; continue statin; limit saturated fats      Relevant Orders   Lipid panel      Follow up plan: Return in about 3 months (around 09/06/2016) for diabetes mellitus and fasting labs.  An after-visit summary was printed and given to the patient at [place].  Please see the patient instructions which may contain other information and recommendations beyond what is mentioned above in the assessment and plan.  No orders of the defined types were placed in this encounter.   Orders Placed This Encounter  Procedures  . Lipid panel  . Hemoglobin A1c  . Microalbumin / creatinine urine ratio  . COMPLETE METABOLIC PANEL WITH GFR

## 2016-06-08 NOTE — Assessment & Plan Note (Addendum)
Check lipids; continue statin; limit saturated fats 

## 2016-06-09 LAB — HEMOGLOBIN A1C
Hgb A1c MFr Bld: 8.1 % — ABNORMAL HIGH (ref <5.7)
Mean Plasma Glucose: 186 mg/dL

## 2016-06-09 LAB — MICROALBUMIN / CREATININE URINE RATIO
Creatinine, Urine: 60 mg/dL (ref 20–370)
Microalb Creat Ratio: 8 mcg/mg creat (ref <30)
Microalb, Ur: 0.5 mg/dL

## 2016-06-12 ENCOUNTER — Other Ambulatory Visit: Payer: Self-pay | Admitting: Family Medicine

## 2016-06-13 NOTE — Telephone Encounter (Signed)
Last labs reviewed; Rxs approved 

## 2016-12-31 ENCOUNTER — Other Ambulatory Visit: Payer: Self-pay | Admitting: Family Medicine

## 2017-01-01 NOTE — Telephone Encounter (Signed)
Pt is scheduled °

## 2017-01-01 NOTE — Telephone Encounter (Signed)
Please ask pt to schedule appt with me and come fasting for labs Last visit was in January I'll refill meds and we'll see him soon Thank you

## 2017-01-08 ENCOUNTER — Encounter: Payer: Self-pay | Admitting: Family Medicine

## 2017-01-08 ENCOUNTER — Ambulatory Visit (INDEPENDENT_AMBULATORY_CARE_PROVIDER_SITE_OTHER): Payer: BLUE CROSS/BLUE SHIELD | Admitting: Family Medicine

## 2017-01-08 VITALS — BP 136/68 | HR 91 | Temp 97.7°F | Resp 16 | Ht 72.0 in | Wt 227.8 lb

## 2017-01-08 DIAGNOSIS — Z6830 Body mass index (BMI) 30.0-30.9, adult: Secondary | ICD-10-CM | POA: Diagnosis not present

## 2017-01-08 DIAGNOSIS — E113293 Type 2 diabetes mellitus with mild nonproliferative diabetic retinopathy without macular edema, bilateral: Secondary | ICD-10-CM

## 2017-01-08 DIAGNOSIS — Z1211 Encounter for screening for malignant neoplasm of colon: Secondary | ICD-10-CM | POA: Diagnosis not present

## 2017-01-08 DIAGNOSIS — L309 Dermatitis, unspecified: Secondary | ICD-10-CM | POA: Insufficient documentation

## 2017-01-08 DIAGNOSIS — E785 Hyperlipidemia, unspecified: Secondary | ICD-10-CM | POA: Diagnosis not present

## 2017-01-08 DIAGNOSIS — L2082 Flexural eczema: Secondary | ICD-10-CM

## 2017-01-08 DIAGNOSIS — Z5181 Encounter for therapeutic drug level monitoring: Secondary | ICD-10-CM | POA: Diagnosis not present

## 2017-01-08 DIAGNOSIS — E6609 Other obesity due to excess calories: Secondary | ICD-10-CM

## 2017-01-08 DIAGNOSIS — E1165 Type 2 diabetes mellitus with hyperglycemia: Secondary | ICD-10-CM | POA: Diagnosis not present

## 2017-01-08 DIAGNOSIS — K219 Gastro-esophageal reflux disease without esophagitis: Secondary | ICD-10-CM

## 2017-01-08 HISTORY — DX: Dermatitis, unspecified: L30.9

## 2017-01-08 MED ORDER — CLOBETASOL PROPIONATE 0.05 % EX CREA: 1.0000 "application " | TOPICAL_CREAM | Freq: Two times a day (BID) | CUTANEOUS | 2 refills | Status: DC

## 2017-01-08 NOTE — Assessment & Plan Note (Signed)
Try to limit triggers; okay to use zantac; work on weight loss

## 2017-01-08 NOTE — Assessment & Plan Note (Signed)
Check liver and kidneys 

## 2017-01-08 NOTE — Assessment & Plan Note (Signed)
Likely compounded by heat; will start clobetasol; too strong for face, underarms, groin

## 2017-01-08 NOTE — Assessment & Plan Note (Signed)
Limit saturated fats; check cholesterol today; toast last night

## 2017-01-08 NOTE — Patient Instructions (Signed)
Please do see your eye doctor regularly, and have your eyes examined every year (or more often per his or her recommendation) Check your feet every night and let me know right away of any sores, infections, numbness, etc. Try to limit sweets, white bread, white rice, white potatoes It is okay with me for you to not check your fingerstick blood sugars (per [employer] of Endocrinology Best Practices), unless you are interested and feel it would be helpful for you  Try to limit saturated fats in your diet (bologna, hot dogs, barbeque, cheeseburgers, hamburgers, steak, bacon, sausage, cheese, etc.) and get more fresh fruits, vegetables, and whole grains  Check out the information at familydoctor.org entitled "Nutrition for Weight Loss: What You Need to Know about Fad Diets" Try to lose between 1-2 pounds per week by taking in fewer calories and burning off more calories You can succeed by limiting portions, limiting foods dense in calories and fat, becoming more active, and drinking 8 glasses of water a day (64 ounces) Don't skip meals, especially breakfast, as skipping meals may alter your metabolism Do not use over-the-counter weight loss pills or gimmicks that claim rapid weight loss A healthy BMI (or body mass index) is between 18.5 and 24.9 You can calculate your ideal BMI at the [place] website [employer]Mon[employer]fr  Think about the shingles vaccine (Shingrix)

## 2017-01-08 NOTE — Assessment & Plan Note (Signed)
Try to lose 10 pounds

## 2017-01-08 NOTE — Assessment & Plan Note (Signed)
Foot exam by MD today; he will call to schedule his eye appt; try to watch diet; check A1c today

## 2017-01-08 NOTE — Assessment & Plan Note (Signed)
Order referral today

## 2017-01-08 NOTE — Progress Notes (Signed)
BP 136/68 (BP Location: Left Arm, Patient Position: Sitting, Cuff Size: Normal)   Pulse 91   Temp 97.7 F (36.5 C) (Oral)   Resp 16   Ht 6' (1.829 m)   Wt 227 lb 12.8 oz (103.3 kg)   SpO2 97%   BMI 30.90 kg/m    Subjective:    Patient ID: Matthew Woodard, male    DOB: Nov 27, 1965, 51 y.o.   MRN: [id number]  HPI: ERHARD SENSKE Woodard is a 51 y.o. male  Chief Complaint  Patient presents with  . Follow-up    Med follow-up   . Rash    Started on his hip and spread to both arms when it became reall hot. Pt states he had it before.    HPI He is here for follow-up Type 2 DM; no problems with feet; due for eye exam; no lows; some dry mouth, from the medicines; no blurred vision Lab Results  Component Value Date   HGBA1C 8.1 (H) 06/08/2016    High cholesterol; does eat meat; mixes up his seafood and chicken and red meat, mixes it up; eggs, but not every day  High blood pressure; not quite to goal; no added salt; no black licorice; no decongestants  Obesity; would be willing to try to lose some weight, never really trying to lose hard; no fad diets  Acid reflux; takes a lot of zantac, generic stuff; triggers are slow cooked  Has rash on the arms and right hip; started where wallet was; worse with heat and sweat; tried hydrocortisone; has this for years  Depression screen [facility] 2/9 01/08/2017 06/08/2016 10/12/2015 09/08/2015  Decreased Interest 0 0 0 0  Down, Depressed, Hopeless 0 0 0 0  PHQ - 2 Score 0 0 0 0    Relevant past medical, surgical, family and social history reviewed Past Medical History:  Diagnosis Date  . Diabetes mellitus without complication ([place])   . Hyperlipidemia LDL goal <100 11/02/2014  . Hypertension    Past Surgical History:  Procedure Laterality Date  . CYST EXCISION  Aug. 2016   on back  . KNEE SURGERY    . NASAL SINUS SURGERY  03/15  . tooth abstraction    . VARICOCELE EXCISION     Family History  Problem Relation Age of Onset  . Heart  disease Father   . Hypertension Father   . COPD Father   . Colon cancer Maternal Grandmother   . Heart attack Paternal Grandfather    Social History   Social History  . Marital status: Married    Spouse name: N/A  . Number of children: N/A  . Years of education: N/A   Occupational History  . Not on file.   Social History Main Topics  . Smoking status: Former Smoker    Types: Cigarettes    Quit date: 06/15/2013  . Smokeless tobacco: Never Used  . Alcohol use No  . Drug use: No  . Sexual activity: Yes   Other Topics Concern  . Not on file   Social History Narrative  . No narrative on file    Interim medical history since last visit reviewed. Allergies and medications reviewed  Review of Systems Per HPI unless specifically indicated above     Objective:    BP 136/68 (BP Location: Left Arm, Patient Position: Sitting, Cuff Size: Normal)   Pulse 91   Temp 97.7 F (36.5 C) (Oral)   Resp 16   Ht 6' (1.829 m)  Wt 227 lb 12.8 oz (103.3 kg)   SpO2 97%   BMI 30.90 kg/m   Wt Readings from Last 3 Encounters:  01/08/17 227 lb 12.8 oz (103.3 kg)  06/08/16 228 lb 9.6 oz (103.7 kg)  10/12/15 229 lb (103.9 kg)    Physical Exam  Constitutional: He appears well-developed and well-nourished. No distress.  HENT:  Right Ear: Hearing normal.  Left Ear: Hearing normal.  Nose: Nose normal.  Mouth/Throat: Oropharynx is clear and moist. He does not have dentures.  edentulous  Eyes: EOM are normal. No scleral icterus.  Neck: No JVD present. No thyromegaly present.  Cardiovascular: Normal rate, regular rhythm and normal heart sounds.   Pulmonary/Chest: Effort normal and breath sounds normal. No respiratory distress. He has no wheezes. He has no rales.  Abdominal: Soft. Bowel sounds are normal. He exhibits no distension. There is no tenderness.  Musculoskeletal: Normal range of motion. He exhibits no edema.  Neurological: He is alert. He displays no tremor.  Skin: Skin is warm  and dry. Rash noted. He is not diaphoretic. No erythema. No pallor.     Erythematous rash on volar surfaces of both arms, antecubital fossae; no discrete border; no scale; no vesicular component  Psychiatric: He has a normal mood and affect. His behavior is normal. Judgment and thought content normal.   Diabetic Foot Form - Detailed   Diabetic Foot Exam - detailed Diabetic Foot exam was performed with the following findings:  Yes 01/08/2017  9:53 AM  Visual Foot Exam completed.:  Yes  Pulse Foot Exam completed.:  Yes  Right Dorsalis Pedis:  Present Left Dorsalis Pedis:  Present  Sensory Foot Exam Completed.:  Yes Semmes-Weinstein Monofilament Test R Site 1-Great Toe:  Pos L Site 1-Great Toe:  Pos        Results for orders placed or performed in visit on 06/08/16  Lipid panel  Result Value Ref Range   Cholesterol 165 <200 mg/dL   Triglycerides 137 <150 mg/dL   HDL 53 >40 mg/dL   Total CHOL/HDL Ratio 3.1 <5.0 Ratio   VLDL 27 <30 mg/dL   LDL Cholesterol 85 <100 mg/dL  Hemoglobin A1c  Result Value Ref Range   Hgb A1c MFr Bld 8.1 (H) <5.7 %   Mean Plasma Glucose 186 mg/dL  Microalbumin / creatinine urine ratio  Result Value Ref Range   Creatinine, Urine 60 20 - 370 mg/dL   Microalb, Ur 0.5 Not estab mg/dL   Microalb Creat Ratio 8 <30 mcg/mg creat  COMPLETE METABOLIC PANEL WITH GFR  Result Value Ref Range   Sodium 137 135 - 146 mmol/L   Potassium 4.5 3.5 - 5.3 mmol/L   Chloride 103 98 - 110 mmol/L   CO2 22 20 - 31 mmol/L   Glucose, Bld 152 (H) 65 - 99 mg/dL   BUN 20 7 - 25 mg/dL   Creat 0.64 (L) 0.70 - 1.33 mg/dL   Total Bilirubin 0.5 0.2 - 1.2 mg/dL   Alkaline Phosphatase 61 40 - 115 U/L   AST 19 10 - 35 U/L   ALT 20 9 - 46 U/L   Total Protein 6.9 6.1 - 8.1 g/dL   Albumin 4.4 3.6 - 5.1 g/dL   Calcium 9.5 8.6 - 10.3 mg/dL   GFR, Est African American >89 >=60 mL/min   GFR, Est Non African American >89 >=60 mL/min      Assessment & Plan:   Problem List Items  Addressed This Visit  Digestive   Acid reflux    Try to limit triggers; okay to use zantac; work on weight loss        Endocrine   Type 2 diabetes mellitus ([place]) - Primary    Foot exam by MD today; he will call to schedule his eye appt; try to watch diet; check A1c today      Relevant Orders   Hemoglobin A1c   Nonproliferative diabetic retinopathy of both eyes ([place])    Patient will see eye doctor soon        Musculoskeletal and Integument   Eczema    Likely compounded by heat; will start clobetasol; too strong for face, underarms, groin        Other   Obesity    Try to lose 10 pounds      Medication monitoring encounter    Check liver and kidneys      Relevant Orders   COMPLETE METABOLIC PANEL WITH GFR   Hyperlipidemia LDL goal <100    Limit saturated fats; check cholesterol today; toast last night      Relevant Orders   Lipid panel   Colon cancer screening    Order referral today      Relevant Orders   Ambulatory referral to Gastroenterology       Follow up plan: Return in about 6 months (around 07/11/2017) for twenty minute follow-up with fasting labs.  An after-visit summary was printed and given to the patient at [place].  Please see the patient instructions which may contain other information and recommendations beyond what is mentioned above in the assessment and plan.  Meds ordered this encounter  Medications  . Multiple Vitamin (MULTIVITAMIN) tablet    Sig: Take 1 tablet by mouth daily.  . Omega-3 Fatty Acids (FISH OIL) 600 MG CAPS    Sig: Take 1 tablet by mouth daily.  . clobetasol cream (TEMOVATE) 0.05 %    Sig: Apply 1 application topically 2 (two) times daily. To affected skin; too strong for face, groin, underarms    Dispense:  30 g    Refill:  2    Orders Placed This Encounter  Procedures  . COMPLETE METABOLIC PANEL WITH GFR  . Hemoglobin A1c  . Lipid panel  . Ambulatory referral to Gastroenterology

## 2017-01-08 NOTE — Assessment & Plan Note (Signed)
Patient will see eye doctor soon

## 2017-01-09 ENCOUNTER — Other Ambulatory Visit: Payer: Self-pay | Admitting: Family Medicine

## 2017-01-09 LAB — LIPID PANEL
Cholesterol: 183 mg/dL (ref <200)
HDL: 53 mg/dL (ref >40)
LDL Cholesterol: 102 mg/dL — ABNORMAL HIGH (ref <100)
Total CHOL/HDL Ratio: 3.5 Ratio (ref <5.0)
Triglycerides: 140 mg/dL (ref <150)
VLDL: 28 mg/dL (ref <30)

## 2017-01-09 LAB — COMPLETE METABOLIC PANEL WITH GFR
ALT: 18 U/L (ref 9–46)
AST: 17 U/L (ref 10–35)
Albumin: 4.4 g/dL (ref 3.6–5.1)
Alkaline Phosphatase: 58 U/L (ref 40–115)
BUN: 21 mg/dL (ref 7–25)
CO2: 22 mmol/L (ref 20–32)
Calcium: 9.4 mg/dL (ref 8.6–10.3)
Chloride: 103 mmol/L (ref 98–110)
Creat: 0.73 mg/dL (ref 0.70–1.33)
GFR, Est African American: >89 mL/min (ref >=60)
GFR, Est Non African American: >89 mL/min (ref >=60)
Glucose, Bld: 129 mg/dL — ABNORMAL HIGH (ref 65–99)
Potassium: 4.3 mmol/L (ref 3.5–5.3)
Sodium: 137 mmol/L (ref 135–146)
Total Bilirubin: 0.6 mg/dL (ref 0.2–1.2)
Total Protein: 6.7 g/dL (ref 6.1–8.1)

## 2017-01-09 LAB — HEMOGLOBIN A1C
Hgb A1c MFr Bld: 8.6 % — ABNORMAL HIGH (ref <5.7)
Mean Plasma Glucose: 200 mg/dL

## 2017-01-09 MED ORDER — PIOGLITAZONE HCL 15 MG PO TABS: 15.0000 mg | ORAL_TABLET | Freq: Every day | ORAL | 2 refills | Status: DC

## 2017-01-09 NOTE — Progress Notes (Signed)
Add actos; if not better, add injectable at next visit

## 2017-01-25 ENCOUNTER — Other Ambulatory Visit: Payer: Self-pay

## 2017-01-25 ENCOUNTER — Telehealth: Payer: Self-pay

## 2017-01-25 DIAGNOSIS — Z1212 Encounter for screening for malignant neoplasm of rectum: Principal | ICD-10-CM

## 2017-01-25 DIAGNOSIS — Z1211 Encounter for screening for malignant neoplasm of colon: Secondary | ICD-10-CM

## 2017-01-25 NOTE — Telephone Encounter (Signed)
Gastroenterology Pre-Procedure Review  Request Date: 9/24 Requesting Physician: Dr. Iannacone Norris  PATIENT REVIEW QUESTIONS: The patient responded to the following health history questions as indicated:    1. Are you having any GI issues? no 2. Do you have a personal history of Polyps? no 3. Do you have a family history of Colon Cancer or Polyps? yes (gm: colon cancer) 4. Diabetes Mellitus? Type II uncontrolled 5. Joint replacements in the past 12 months?no 6. Major health problems in the past 3 months?no 7. Any artificial heart valves, MVP, or defibrillator?no    MEDICATIONS & ALLERGIES:    Patient reports the following regarding taking any anticoagulation/antiplatelet therapy:   Plavix, Coumadin, Eliquis, Xarelto, Lovenox, Pradaxa, Brilinta, or Effient? no Aspirin? yes (81mg )  Patient confirms/reports the following medications:  Current Outpatient Prescriptions  Medication Sig Dispense Refill  . aspirin EC 81 MG tablet Take 1 tablet (81 mg total) by mouth daily.    Marland Kitchen atorvastatin (LIPITOR) 20 MG tablet TAKE 1 TABLET BY MOUTH AT BEDTIME 30 tablet 6  . clobetasol cream (TEMOVATE) 8.45 % Apply 1 application topically 2 (two) times daily. To affected skin; too strong for face, groin, underarms 30 g 2  . Glucose Blood (ACCU-CHEK AVIVA PLUS VI) by In Vitro route.    . INVOKANA 300 MG TABS tablet TAKE 1 TABLET BY MOUTH EVERY DAY 30 tablet 6  . JANUVIA 100 MG tablet TAKE 1 TABLET BY MOUTH EVERY DAY 90 tablet 0  . lisinopril (PRINIVIL,ZESTRIL) 10 MG tablet TAKE 1 TABLET BY MOUTH EVERY DAY 90 tablet 0  . metFORMIN (GLUCOPHAGE-XR) 500 MG 24 hr tablet TAKE 4 TABLETS BY MOUTH EVERY DAY 120 tablet 6  . Multiple Vitamin (MULTIVITAMIN) tablet Take 1 tablet by mouth daily.    . Omega-3 Fatty Acids (FISH OIL) 600 MG CAPS Take 1 tablet by mouth daily.    . pioglitazone (ACTOS) 15 MG tablet Take 1 tablet (15 mg total) by mouth daily. 30 tablet 2   No current facility-administered medications for this visit.      Patient confirms/reports the following allergies:  Allergies  Allergen Reactions  . Glucotrol [Glipizide] Rash    No orders of the defined types were placed in this encounter.   AUTHORIZATION INFORMATION Primary Insurance: 1D#: Group #:  Secondary Insurance: 1D#: Group #:  SCHEDULE INFORMATION: Date: 9/24 Time: Location: [place]

## 2017-01-30 ENCOUNTER — Other Ambulatory Visit: Payer: Self-pay | Admitting: Family Medicine

## 2017-01-31 ENCOUNTER — Other Ambulatory Visit: Payer: Self-pay | Admitting: Family Medicine

## 2017-02-13 ENCOUNTER — Encounter: Payer: Self-pay | Admitting: *Deleted

## 2017-02-22 NOTE — Discharge Instructions (Signed)
General Anesthesia, Adult, Care After °These instructions provide you with information about caring for yourself after your procedure. Your health care provider may also give you more specific instructions. Your treatment has been planned according to current medical practices, but problems sometimes occur. Call your health care provider if you have any problems or questions after your procedure. °What can I expect after the procedure? °After the procedure, it is common to have: °· Vomiting. °· A sore throat. °· Mental slowness. ° °It is common to feel: °· Nauseous. °· Cold or shivery. °· Sleepy. °· Tired. °· Sore or achy, even in parts of your body where you did not have surgery. ° °Follow these instructions at home: °For at least 24 hours after the procedure: °· Do not: °? Participate in activities where you could fall or become injured. °? Drive. °? Use heavy machinery. °? Drink alcohol. °? Take sleeping pills or medicines that cause drowsiness. °? Make important decisions or sign legal documents. °? Take care of children on your own. °· Rest. °Eating and drinking °· If you vomit, drink water, juice, or soup when you can drink without vomiting. °· Drink enough fluid to keep your urine clear or pale yellow. °· Make sure you have little or no nausea before eating solid foods. °· Follow the diet recommended by your health care provider. °General instructions °· Have a responsible adult stay with you until you are awake and alert. °· Return to your normal activities as told by your health care provider. Ask your health care provider what activities are safe for you. °· Take over-the-counter and prescription medicines only as told by your health care provider. °· If you smoke, do not smoke without supervision. °· Keep all follow-up visits as told by your health care provider. This is important. °Contact a health care provider if: °· You continue to have nausea or vomiting at home, and medicines are not helpful. °· You  cannot drink fluids or start eating again. °· You cannot urinate after 8-12 hours. °· You develop a skin rash. °· You have fever. °· You have increasing redness at the site of your procedure. °Get help right away if: °· You have difficulty breathing. °· You have chest pain. °· You have unexpected bleeding. °· You feel that you are having a life-threatening or urgent problem. °This information is not intended to replace advice given to you by your health care provider. Make sure you discuss any questions you have with your health care provider. °Document Released: 08/07/2000 Document Revised: 10/04/2015 Document Reviewed: 04/15/2015 °Elsevier Interactive Patient Education © 2018 [employer] ° °

## 2017-02-23 ENCOUNTER — Ambulatory Visit: Payer: BLUE CROSS/BLUE SHIELD | Admitting: Anesthesiology

## 2017-02-23 ENCOUNTER — Ambulatory Visit
Admission: RE | Admit: 2017-02-23 | Discharge: 2017-02-23 | Disposition: A | Discharge status: Home / Self Care | Payer: BLUE CROSS/BLUE SHIELD | Source: Ambulatory Visit | Attending: Gastroenterology | Admitting: Gastroenterology

## 2017-02-23 ENCOUNTER — Ambulatory Visit
Admission: RE | Disposition: A | Discharge status: Home / Self Care | Payer: Self-pay | Source: Ambulatory Visit | Attending: Gastroenterology

## 2017-02-23 DIAGNOSIS — K635 Polyp of colon: Secondary | ICD-10-CM | POA: Insufficient documentation

## 2017-02-23 DIAGNOSIS — Z1211 Encounter for screening for malignant neoplasm of colon: Secondary | ICD-10-CM | POA: Diagnosis not present

## 2017-02-23 DIAGNOSIS — Z87891 Personal history of nicotine dependence: Secondary | ICD-10-CM | POA: Insufficient documentation

## 2017-02-23 DIAGNOSIS — K640 First degree hemorrhoids: Secondary | ICD-10-CM | POA: Diagnosis not present

## 2017-02-23 DIAGNOSIS — Z79899 Other long term (current) drug therapy: Secondary | ICD-10-CM | POA: Insufficient documentation

## 2017-02-23 DIAGNOSIS — Z7982 Long term (current) use of aspirin: Secondary | ICD-10-CM | POA: Insufficient documentation

## 2017-02-23 DIAGNOSIS — E119 Type 2 diabetes mellitus without complications: Secondary | ICD-10-CM | POA: Diagnosis not present

## 2017-02-23 DIAGNOSIS — Z1212 Encounter for screening for malignant neoplasm of rectum: Secondary | ICD-10-CM

## 2017-02-23 DIAGNOSIS — I10 Essential (primary) hypertension: Secondary | ICD-10-CM | POA: Diagnosis not present

## 2017-02-23 DIAGNOSIS — Z7984 Long term (current) use of oral hypoglycemic drugs: Secondary | ICD-10-CM | POA: Diagnosis not present

## 2017-02-23 DIAGNOSIS — D124 Benign neoplasm of descending colon: Secondary | ICD-10-CM

## 2017-02-23 DIAGNOSIS — E785 Hyperlipidemia, unspecified: Secondary | ICD-10-CM | POA: Insufficient documentation

## 2017-02-23 HISTORY — PX: COLONOSCOPY WITH PROPOFOL: SHX5780

## 2017-02-23 HISTORY — DX: Unspecified osteoarthritis, unspecified site: M19.90

## 2017-02-23 HISTORY — PX: POLYPECTOMY: SHX5525

## 2017-02-23 HISTORY — DX: Presence of dental prosthetic device (complete) (partial): Z97.2

## 2017-02-23 LAB — GLUCOSE, CAPILLARY
Glucose-Capillary: 164 mg/dL — ABNORMAL HIGH (ref 65–99)
Glucose-Capillary: 124 mg/dL — ABNORMAL HIGH (ref 65–99)

## 2017-02-23 SURGERY — COLONOSCOPY WITH PROPOFOL
Anesthesia: General

## 2017-02-23 MED ORDER — STERILE WATER FOR IRRIGATION IR SOLN
Status: DC | PRN
  Administered 2017-02-23: 08:00:00

## 2017-02-23 MED ORDER — MEPERIDINE HCL 25 MG/ML IJ SOLN: 6.2500 mg | INTRAMUSCULAR | Status: DC | PRN

## 2017-02-23 MED ORDER — LACTATED RINGERS IV SOLN
10.0000 mL/h | INTRAVENOUS | Status: DC
  Administered 2017-02-23: 10 mL/h via INTRAVENOUS

## 2017-02-23 MED ORDER — SODIUM CHLORIDE 0.9 % IV SOLN: INTRAVENOUS | Status: DC

## 2017-02-23 MED ORDER — OXYCODONE HCL 5 MG/5ML PO SOLN: 5.0000 mg | Freq: Once | ORAL | Status: DC | PRN

## 2017-02-23 MED ORDER — OXYCODONE HCL 5 MG PO TABS
5.0000 mg | ORAL_TABLET | Freq: Once | ORAL | Status: DC | PRN
Start: 2017-02-23 — End: 2017-02-23

## 2017-02-23 MED ORDER — PROMETHAZINE HCL 25 MG/ML IJ SOLN: 6.2500 mg | INTRAMUSCULAR | Status: DC | PRN

## 2017-02-23 MED ORDER — FENTANYL CITRATE (PF) 100 MCG/2ML IJ SOLN: 25.0000 ug | INTRAMUSCULAR | Status: DC | PRN

## 2017-02-23 MED ORDER — PROPOFOL 10 MG/ML IV BOLUS
INTRAVENOUS | Status: DC | PRN
  Administered 2017-02-23 (×6): 20 mg via INTRAVENOUS
  Administered 2017-02-23: 80 mg via INTRAVENOUS

## 2017-02-23 MED ORDER — LIDOCAINE HCL (CARDIAC) 20 MG/ML IV SOLN
INTRAVENOUS | Status: DC | PRN
  Administered 2017-02-23: 40 mg via INTRAVENOUS

## 2017-02-23 SURGICAL SUPPLY — 23 items
CANISTER SUCT 1200ML W/VALVE (MISCELLANEOUS) ×2 IMPLANT
CLIP HMST 235XBRD CATH ROT (MISCELLANEOUS) IMPLANT
CLIP RESOLUTION 360 11X235 (MISCELLANEOUS)
FCP ESCP3.2XJMB 240X2.8X (MISCELLANEOUS) ×1
FORCEPS BIOP RAD 4 LRG CAP 4 (CUTTING FORCEPS) IMPLANT
FORCEPS BIOP RJ4 240 W/NDL (MISCELLANEOUS) ×2
FORCEPS ESCP3.2XJMB 240X2.8X (MISCELLANEOUS) IMPLANT
GOWN CVR UNV OPN BCK APRN NK (MISCELLANEOUS) ×2 IMPLANT
GOWN ISOL THUMB LOOP REG UNIV (MISCELLANEOUS) ×4
INJECTOR VARIJECT VIN23 (MISCELLANEOUS) IMPLANT
KIT DEFENDO VALVE AND CONN (KITS) IMPLANT
KIT ENDO PROCEDURE OLY (KITS) ×2 IMPLANT
MARKER SPOT ENDO TATTOO 5ML (MISCELLANEOUS) IMPLANT
PAD GROUND ADULT SPLIT (MISCELLANEOUS) IMPLANT
PROBE APC STR FIRE (PROBE) IMPLANT
RETRIEVER NET ROTH 2.5X230 LF (MISCELLANEOUS) IMPLANT
SNARE SHORT THROW 13M SML OVAL (MISCELLANEOUS) IMPLANT
SNARE SHORT THROW 30M LRG OVAL (MISCELLANEOUS) IMPLANT
SNARE SNG USE RND 15MM (INSTRUMENTS) IMPLANT
SPOT EX ENDOSCOPIC TATTOO (MISCELLANEOUS)
TRAP ETRAP POLY (MISCELLANEOUS) IMPLANT
VARIJECT INJECTOR VIN23 (MISCELLANEOUS)
WATER STERILE IRR 250ML POUR (IV SOLUTION) ×2 IMPLANT

## 2017-02-23 NOTE — Anesthesia Preprocedure Evaluation (Signed)
Anesthesia Evaluation  Patient identified by MRN, date of birth, ID band Patient awake    Airway Mallampati: I  TM Distance: >3 FB Neck ROM: Full    Dental   Pulmonary former smoker,    Pulmonary exam normal        Cardiovascular hypertension, Normal cardiovascular exam     Neuro/Psych    GI/Hepatic GERD  Medicated,  Endo/Other  diabetes, Well Controlled, Type 2  Renal/GU      Musculoskeletal   Abdominal   Peds  Hematology   Anesthesia Other Findings   Reproductive/Obstetrics                             Anesthesia Physical Anesthesia Plan  ASA: II  Anesthesia Plan: General   Post-op Pain Management:    Induction: Intravenous  PONV Risk Score and Plan:   Airway Management Planned: Natural Airway  Additional Equipment:   Intra-op Plan:   Post-operative Plan:   Informed Consent: I have reviewed the patients History and Physical, chart, labs and discussed the procedure including the risks, benefits and alternatives for the proposed anesthesia with the patient or authorized representative who has indicated his/her understanding and acceptance.     Plan Discussed with: CRNA  Anesthesia Plan Comments:         Anesthesia Quick Evaluation

## 2017-02-23 NOTE — Op Note (Signed)
[name] Gastroenterology Patient Name: Matthew Woodard Procedure Date: 02/23/2017 8:10 AM MRN: [id number] Account #: [id number] Date of Birth: [birth year] Admit Type: Outpatient Age: 51 Room: [facility] OR ROOM 01 Gender: Male Note Status: Finalized Procedure:            Colonoscopy Indications:          Screening for colorectal malignant neoplasm Providers:            [name] MD, MD Referring MD:         Arnetha Courser (Referring MD) Medicines:            Propofol per Anesthesia Complications:        No immediate complications. Procedure:            Pre-Anesthesia Assessment:                       - Prior to the procedure, a History and Physical was                        performed, and patient medications and allergies were                        reviewed. The patient's tolerance of previous                        anesthesia was also reviewed. The risks and benefits of                        the procedure and the sedation options and risks were                        discussed with the patient. All questions were                        answered, and informed consent was obtained. Prior                        Anticoagulants: The patient has taken no previous                        anticoagulant or antiplatelet agents. ASA Grade                        Assessment: II - A patient with mild systemic disease.                        After reviewing the risks and benefits, the patient was                        deemed in satisfactory condition to undergo the                        procedure.                       After obtaining informed consent, the colonoscope was                        passed under direct vision. Throughout the procedure,  the patient's blood pressure, pulse, and oxygen                        saturations were monitored continuously. The Olympus                        Colonoscope 190 [id number]) was introduced through the                         anus and advanced to the the cecum, identified by                        appendiceal orifice and ileocecal valve. The                        colonoscopy was performed without difficulty. The                        patient tolerated the procedure well. The quality of                        the bowel preparation was excellent. Findings:      The perianal and digital rectal examinations were normal.      A 3 mm polyp was found in the descending colon. The polyp was sessile.       The polyp was removed with a cold biopsy forceps. Resection and       retrieval were complete.      Non-bleeding internal hemorrhoids were found during retroflexion. The       hemorrhoids were Grade I (internal hemorrhoids that do not prolapse). Impression:           - One 3 mm polyp in the descending colon, removed with                        a cold biopsy forceps. Resected and retrieved.                       - Non-bleeding internal hemorrhoids. Recommendation:       - Discharge patient to home.                       - Resume previous diet.                       - Continue present medications.                       - Await pathology results.                       - Repeat colonoscopy in 5 years if polyp adenoma and 10                        years if hyperplastic Procedure Code(s):    --- Professional ---                       [phone], Colonoscopy, flexible; with biopsy, single or                        multiple Diagnosis Code(s):    --- Professional ---  Z12.11, Encounter for screening for malignant neoplasm                        of colon                       D12.4, Benign neoplasm of descending colon                       K64.0, First degree hemorrhoids CPT copyright 2016 American Medical Association. All rights reserved. The codes documented in this report are preliminary and upon coder review may  be revised to meet current compliance requirements. [name] MD,  MD 02/23/2017 8:35:54 AM This report has been signed electronically. Number of Addenda: 0 Note Initiated On: 02/23/2017 8:10 AM Scope Withdrawal Time: 0 hours 7 minutes 10 seconds  Total Procedure Duration: 0 hours 12 minutes 39 seconds       [facility]

## 2017-02-23 NOTE — Anesthesia Postprocedure Evaluation (Signed)
Anesthesia Post Note  Patient: Matthew Woodard  Procedure(s) Performed: COLONOSCOPY WITH PROPOFOL (N/A ) POLYPECTOMY (N/A )  Patient location during evaluation: PACU Anesthesia Type: General Level of consciousness: awake and alert Pain management: pain level controlled Vital Signs Assessment: post-procedure vital signs reviewed and stable Respiratory status: spontaneous breathing, nonlabored ventilation, respiratory function stable and patient connected to nasal cannula oxygen Cardiovascular status: blood pressure returned to baseline and stable Postop Assessment: no apparent nausea or vomiting Anesthetic complications: no    Marshell Levan

## 2017-02-23 NOTE — Transfer of Care (Signed)
Immediate Anesthesia Transfer of Care Note  Patient: Matthew Woodard  Procedure(s) Performed: COLONOSCOPY WITH PROPOFOL (N/A ) POLYPECTOMY (N/A )  Patient Location: PACU  Anesthesia Type: General  Level of Consciousness: awake, alert  and patient cooperative  Airway and Oxygen Therapy: Patient Spontanous Breathing and Patient connected to supplemental oxygen  Post-op Assessment: Post-op Vital signs reviewed, Patient's Cardiovascular Status Stable, Respiratory Function Stable, Patent Airway and No signs of Nausea or vomiting  Post-op Vital Signs: Reviewed and stable  Complications: No apparent anesthesia complications

## 2017-02-23 NOTE — Anesthesia Procedure Notes (Signed)
Performed by: Lauryn Lizardi Pre-anesthesia Checklist: Patient identified, Emergency Drugs available, Suction available, Timeout performed and Patient being monitored Patient Re-evaluated:Patient Re-evaluated prior to induction Oxygen Delivery Method: Nasal cannula Placement Confirmation: positive ETCO2       

## 2017-02-23 NOTE — H&P (Signed)
Matthew Lame, MD [address] [place], [place] [address] Phone: [phone] Fax : [phone]  Primary Care Physician:  Arnetha Courser, MD Primary Gastroenterologist:  Dr. Lapka Norris  Pre-Procedure History & Physical: HPI:  Matthew Woodard is a 51 y.o. male is here for a screening colonoscopy.   Past Medical History:  Diagnosis Date  . Arthritis    hands, left knee  . Diabetes mellitus without complication ([place])   . Hyperlipidemia LDL goal <100 11/02/2014  . Hypertension   . Wears dentures    full upper, partial lower.  Has, does not wear    Past Surgical History:  Procedure Laterality Date  . CYST EXCISION  Aug. 2016   on back  . KNEE SURGERY    . NASAL SINUS SURGERY  03/15  . tooth abstraction    . VARICOCELE EXCISION      Prior to Admission medications   Medication Sig Start Date End Date Taking? Authorizing Provider  aspirin EC 81 MG tablet Take 1 tablet (81 mg total) by mouth daily. 10/12/15  Yes [name], [name], MD  atorvastatin (LIPITOR) 20 MG tablet TAKE 1 TABLET BY MOUTH AT BEDTIME 01/31/17  Yes Keith Rake Asad A, MD  clobetasol cream (TEMOVATE) 4.85 % Apply 1 application topically 2 (two) times daily. To affected skin; too strong for face, groin, underarms 01/08/17  Yes [name], [name], MD  glucosamine-chondroitin 500-400 MG tablet Take 1 tablet by mouth 3 (three) times daily.   Yes [provider]  ibuprofen (ADVIL,MOTRIN) 200 MG tablet Take 200 mg by mouth every 8 (eight) hours as needed.   Yes [provider]  INVOKANA 300 MG TABS tablet TAKE 1 TABLET BY MOUTH EVERY DAY 01/31/17  Yes Keith Rake Asad A, MD  JANUVIA 100 MG tablet TAKE 1 TABLET BY MOUTH EVERY DAY 01/01/17  Yes [name], [name], MD  lisinopril (PRINIVIL,ZESTRIL) 10 MG tablet TAKE 1 TABLET BY MOUTH EVERY DAY 01/01/17  Yes [name], [name], MD  metFORMIN (GLUCOPHAGE-XR) 500 MG 24 hr tablet TAKE 4 TABLETS BY MOUTH EVERY DAY 01/31/17  Yes Roselee Nova, MD  Multiple  Vitamin (MULTIVITAMIN) tablet Take 1 tablet by mouth daily.   Yes [provider]  Omega-3 Fatty Acids (FISH OIL) 600 MG CAPS Take 1 tablet by mouth daily.   Yes [provider]  ranitidine (ZANTAC) 75 MG tablet Take 75 mg by mouth 2 (two) times daily.   Yes [provider]  Glucose Blood (ACCU-CHEK AVIVA PLUS VI) by In Vitro route.    [provider]  pioglitazone (ACTOS) 15 MG tablet Take 1 tablet (15 mg total) by mouth daily. Patient not taking: Reported on 02/13/2017 01/09/17   Arnetha Courser, MD    Allergies as of 01/25/2017 - Review Complete 01/08/2017  Allergen Reaction Noted  . Glucotrol [glipizide] Rash 10/22/2014    Family History  Problem Relation Age of Onset  . Heart disease Father   . Hypertension Father   . COPD Father   . Colon cancer Maternal Grandmother   . Heart attack Paternal Grandfather     Social History   Social History  . Marital status: Married    Spouse name: N/A  . Number of children: N/A  . Years of education: N/A   Occupational History  . Not on file.   Social History Main Topics  . Smoking status: Former Smoker    Types: Cigarettes    Quit date: 06/15/2013  .  Smokeless tobacco: Never Used  . Alcohol use No  . Drug use: No  . Sexual activity: Yes   Other Topics Concern  . Not on file   Social History Narrative  . No narrative on file    Review of Systems: See HPI, otherwise negative ROS  Physical Exam: BP 140/87   Pulse 72   Temp 97.9 F (36.6 C) (Temporal)   Resp 16   Ht 6' (1.829 m)   Wt 222 lb (100.7 kg)   SpO2 97%   BMI 30.11 kg/m  General:   Alert,  pleasant and cooperative in NAD Head:  Normocephalic and atraumatic. Neck:  Supple; no masses or thyromegaly. Lungs:  Clear throughout to auscultation.    Heart:  Regular rate and rhythm. Abdomen:  Soft, nontender and nondistended. Normal bowel sounds, without guarding, and without rebound.   Neurologic:  Alert and  oriented x4;   grossly normal neurologically.  Impression/Plan: Matthew Woodard is now here to undergo a screening colonoscopy.  Risks, benefits, and alternatives regarding colonoscopy have been reviewed with the patient.  Questions have been answered.  All parties agreeable.

## 2017-02-26 ENCOUNTER — Encounter: Payer: Self-pay | Admitting: Gastroenterology

## 2017-02-27 ENCOUNTER — Encounter: Payer: Self-pay | Admitting: Gastroenterology

## 2017-02-28 ENCOUNTER — Encounter: Payer: Self-pay | Admitting: Gastroenterology

## 2017-03-09 ENCOUNTER — Other Ambulatory Visit: Payer: Self-pay | Admitting: Family Medicine

## 2017-03-09 NOTE — Telephone Encounter (Signed)
Copied from [place] #[id number]. Topic: Quick Communication - See Telephone Encounter >> Mar 09, 2017  4:49 PM Corie Chiquito, [place] wrote: CRM for notification. See Telephone encounter for:  03/09/17.Patient calling because he needs a refill on Metformin and Invokana. Also a three medication but couldn't remember the name of it. Patient has contacted the pharmacy about this as well

## 2017-03-13 ENCOUNTER — Telehealth: Payer: Self-pay | Admitting: Family Medicine

## 2017-03-13 MED ORDER — CANAGLIFLOZIN 300 MG PO TABS: 300.0000 mg | ORAL_TABLET | Freq: Every day | ORAL | 2 refills | Status: DC

## 2017-03-13 NOTE — Telephone Encounter (Signed)
Called patient to notify that all rx's, had refills except Invokana. Will send to Dr. Sanda Klein for request.

## 2017-03-13 NOTE — Telephone Encounter (Signed)
Pt called to request refill on  metFORMIN (GLUCOPHAGE-XR) 500 MG 24 hr tablet  JANUVIA 100 MG tablet  INVOKANA 300 MG TABS tablet  lisinopril (PRINIVIL,ZESTRIL) 10 MG tablet   cb for pt is [phone] Pt has been trying to get refills on medications all week, with no answer.

## 2017-03-13 NOTE — Telephone Encounter (Signed)
Pt requesting refill on Metformin. Says that when rx was filled there was only 120 pills dispensed. Also requesting 90 day refill on all meds.  Metformin runs out tomorrow.

## 2017-03-14 ENCOUNTER — Other Ambulatory Visit: Payer: Self-pay

## 2017-03-14 ENCOUNTER — Other Ambulatory Visit: Payer: Self-pay | Admitting: Family Medicine

## 2017-03-14 MED ORDER — METFORMIN HCL ER 500 MG PO TB24
2000.0000 mg | ORAL_TABLET | Freq: Every day | ORAL | 0 refills | Status: DC
Start: 2017-03-14 — End: 2017-04-06

## 2017-03-14 NOTE — Telephone Encounter (Signed)
#  120 Metformin sent to [facility] Then patient must be seen per [facility]

## 2017-03-14 NOTE — Telephone Encounter (Signed)
Called pharmacy [facility]) to confirm quantity dispensed to pt last. Pharmacy tech did confirm that the pt only received 120 tablets at his last fill on 01/30/17. Please advise. Thanks

## 2017-03-14 NOTE — Telephone Encounter (Signed)
Patient notified of script.

## 2017-03-19 ENCOUNTER — Ambulatory Visit: Payer: Self-pay | Admitting: *Deleted

## 2017-03-19 DIAGNOSIS — M1710 Unilateral primary osteoarthritis, unspecified knee: Secondary | ICD-10-CM | POA: Insufficient documentation

## 2017-03-19 DIAGNOSIS — M179 Osteoarthritis of knee, unspecified: Secondary | ICD-10-CM | POA: Insufficient documentation

## 2017-03-19 NOTE — Telephone Encounter (Signed)
Megan with [facility] called to get a list of medications for Mr. Kitt. He did not know what meds he is currently taking.  This is his first visit with [facility].

## 2017-04-06 ENCOUNTER — Other Ambulatory Visit: Payer: Self-pay | Admitting: Family Medicine

## 2017-04-07 NOTE — Telephone Encounter (Signed)
Based on last A1c and lab result note, patient is due for follow-up around 11/27 Please contact him and get him scheduled Thank you

## 2017-04-09 NOTE — Telephone Encounter (Signed)
Pt scheduled for the end of Dec, per pt he cannot come in before then due to work

## 2017-05-09 ENCOUNTER — Encounter: Payer: Self-pay | Admitting: Family Medicine

## 2017-05-09 ENCOUNTER — Ambulatory Visit (INDEPENDENT_AMBULATORY_CARE_PROVIDER_SITE_OTHER): Payer: BLUE CROSS/BLUE SHIELD | Admitting: Family Medicine

## 2017-05-09 VITALS — BP 110/64 | HR 85 | Temp 98.3°F | Ht 72.13 in | Wt 229.3 lb

## 2017-05-09 DIAGNOSIS — E785 Hyperlipidemia, unspecified: Secondary | ICD-10-CM

## 2017-05-09 DIAGNOSIS — E119 Type 2 diabetes mellitus without complications: Secondary | ICD-10-CM

## 2017-05-09 DIAGNOSIS — Z1211 Encounter for screening for malignant neoplasm of colon: Secondary | ICD-10-CM

## 2017-05-09 DIAGNOSIS — E6609 Other obesity due to excess calories: Secondary | ICD-10-CM

## 2017-05-09 DIAGNOSIS — M170 Bilateral primary osteoarthritis of knee: Secondary | ICD-10-CM | POA: Diagnosis not present

## 2017-05-09 DIAGNOSIS — Z0189 Encounter for other specified special examinations: Secondary | ICD-10-CM | POA: Insufficient documentation

## 2017-05-09 DIAGNOSIS — Z6831 Body mass index (BMI) 31.0-31.9, adult: Secondary | ICD-10-CM

## 2017-05-09 DIAGNOSIS — Z0001 Encounter for general adult medical examination with abnormal findings: Secondary | ICD-10-CM | POA: Diagnosis not present

## 2017-05-09 DIAGNOSIS — Z0000 Encounter for general adult medical examination without abnormal findings: Secondary | ICD-10-CM

## 2017-05-09 NOTE — Assessment & Plan Note (Signed)
UTD

## 2017-05-09 NOTE — Progress Notes (Signed)
Patient ID: Matthew Woodard, male   DOB: July 03, 1965, 51 y.o.   MRN: [id number]   Subjective:   Matthew Woodard is a 51 y.o. male here for a complete physical exam  Interim issues since last visit: had colonoscopy  He saw doctor about knee, so-so; they did xrays; right knee was hurting, left was worse on the xrays; broke left kneecap in high school, had surgery to remove cartilage in left knee; buddy did xrays 25-30 years ago, friend could not see how he walked; ortho said pretty bad, may need replacement in the future; had cortisone shot once (a while ago)  Joints were really bothering him; doctor started MTX and folic acid and pt is on meloxicam too; he had ibuprofen in his medicine list and says he was not aware that he shouldn't take both  Type 2 diabetes; not checking sugars with my blessing; no dry mouth, no blurred vision; due for eye exam  Off of statin because of joint pains; diet Mt Dew, otherwise fasting today for labs for his high cholesterol  USPSTF grade A and B recommendations Depression:  Depression screen [facility] 2/9 05/09/2017 01/08/2017 06/08/2016 10/12/2015 09/08/2015  Decreased Interest 0 0 0 0 0  Down, Depressed, Hopeless 0 0 0 0 0  PHQ - 2 Score 0 0 0 0 0   Hypertension: BP Readings from Last 3 Encounters:  05/09/17 110/64  02/23/17 125/88  01/08/17 136/68   Obesity: Wt Readings from Last 3 Encounters:  05/09/17 229 lb 4.8 oz (104 kg)  02/23/17 222 lb (100.7 kg)  01/08/17 227 lb 12.8 oz (103.3 kg)   BMI Readings from Last 3 Encounters:  05/09/17 30.99 kg/m  02/23/17 30.11 kg/m  01/08/17 30.90 kg/m    Skin cancer: spot on dome of scalp, may be AK; rough bump on upper LEFT side chest (SK); seeing dermatologist (also for the cracks in his hands) Lung cancer:  Not old enough Prostate cancer: not old enough No results found for: PSA Colorectal cancer: had colonoscopy  AAA: not old enough Aspirin: taking aspirin Diet: room for improvement Exercise:  active at work Alcohol: no Tobacco use: former, quit 4 years ago Feb 1st HIV, hep B, hep C: not intersted STD testing and prevention (chl/gon/syphilis): not intersted  Lipids: stopped statin 3 months ago Lab Results  Component Value Date   CHOL 183 01/08/2017   CHOL 165 06/08/2016   CHOL 156 09/08/2015   Lab Results  Component Value Date   HDL 53 01/08/2017   HDL 53 06/08/2016   HDL 48 09/08/2015   Lab Results  Component Value Date   LDLCALC 102 (H) 01/08/2017   LDLCALC 85 06/08/2016   LDLCALC 87 09/08/2015   Lab Results  Component Value Date   TRIG 140 01/08/2017   TRIG 137 06/08/2016   TRIG 104 09/08/2015   Lab Results  Component Value Date   CHOLHDL 3.5 01/08/2017   CHOLHDL 3.1 06/08/2016   CHOLHDL 3.3 09/08/2015   No results found for: LDLDIRECT Glucose:  Glucose  Date Value Ref Range Status  07/31/2013 246 (H) 65 - 99 mg/dL Final  02/01/2013 217 (H) 65 - 99 mg/dL Final   Glucose, Bld  Date Value Ref Range Status  01/08/2017 129 (H) 65 - 99 mg/dL Final  06/08/2016 152 (H) 65 - 99 mg/dL Final  09/08/2015 121 (H) 65 - 99 mg/dL Final   Glucose-Capillary  Date Value Ref Range Status  02/23/2017 124 (H) 65 - 99 mg/dL  Final  02/23/2017 164 (H) 65 - 99 mg/dL Final     Past Medical History:  Diagnosis Date  . Arthritis    hands, left knee  . Diabetes mellitus without complication ([place])   . Hyperlipidemia LDL goal <100 11/02/2014  . Hypertension   . Wears dentures    full upper, partial lower.  Has, does not wear   Past Surgical History:  Procedure Laterality Date  . COLONOSCOPY WITH PROPOFOL N/A 02/23/2017   Procedure: COLONOSCOPY WITH PROPOFOL;  Surgeon: [name], MD;  Location: [facility];  Service: Endoscopy;  Laterality: N/A;  Diabetic - oral meds  . CYST EXCISION  Aug. 2016   on back  . KNEE SURGERY    . NASAL SINUS SURGERY  03/15  . POLYPECTOMY N/A 02/23/2017   Procedure: POLYPECTOMY;  Surgeon: [name], MD;  Location:  [facility];  Service: Endoscopy;  Laterality: N/A;  . tooth abstraction    . VARICOCELE EXCISION     Family History  Problem Relation Age of Onset  . Heart disease Father   . Hypertension Father   . COPD Father   . Colon cancer Maternal Grandmother   . Heart attack Paternal Grandfather    Social History   Tobacco Use  . Smoking status: Former Smoker    Types: Cigarettes    Last attempt to quit: 06/15/2013    Years since quitting: 3.9  . Smokeless tobacco: Never Used  Substance Use Topics  . Alcohol use: No    Alcohol/week: 0.0 oz  . Drug use: No   Review of Systems  Respiratory: Negative for shortness of breath.   Cardiovascular: Negative for chest pain.  Gastrointestinal: Negative for blood in stool.  Endocrine: Negative for polydipsia.  Genitourinary: Negative for hematuria.  Musculoskeletal: Positive for arthralgias.  Psychiatric/Behavioral: Negative for dysphoric mood.    Objective:   Vitals:   05/09/17 0910  BP: 110/64  Pulse: 85  Temp: 98.3 F (36.8 C)  TempSrc: Oral  SpO2: 97%  Weight: 229 lb 4.8 oz (104 kg)  Height: 6' 0.13" (1.832 m)   Body mass index is 30.99 kg/m. Wt Readings from Last 3 Encounters:  05/09/17 229 lb 4.8 oz (104 kg)  02/23/17 222 lb (100.7 kg)  01/08/17 227 lb 12.8 oz (103.3 kg)   Physical Exam  Constitutional: He appears well-developed and well-nourished. No distress.  HENT:  Head: Normocephalic and atraumatic.  Nose: Nose normal.  Mouth/Throat: Oropharynx is clear and moist.  Eyes: EOM are normal. No scleral icterus.  Neck: No JVD present. No thyromegaly present.  Cardiovascular: Normal rate, regular rhythm and normal heart sounds.  Pulmonary/Chest: Effort normal and breath sounds normal. No respiratory distress. He has no wheezes. He has no rales.  Abdominal: Soft. Bowel sounds are normal. He exhibits no distension. There is no tenderness. There is no guarding.  Musculoskeletal: Normal range of motion. He exhibits  no edema.  Mild crepitus of the knees with active flexion and extension of both lower legs  Lymphadenopathy:    He has no cervical adenopathy.  Neurological: He is alert. He displays normal reflexes. He exhibits normal muscle tone. Coordination normal.  Skin: Skin is warm and dry. He is not diaphoretic. No erythema. No pallor.  A few cracks and fissures on the hands  Psychiatric: He has a normal mood and affect. His behavior is normal. Judgment and thought content normal. His mood appears not anxious. He does not exhibit a depressed mood.   Diabetic Foot  Form - Detailed   Diabetic Foot Exam - detailed Diabetic Foot exam was performed with the following findings:  Yes 05/09/2017 12:39 PM  Visual Foot Exam completed.:  Yes  Pulse Foot Exam completed.:  Yes  Right Dorsalis Pedis:  Present Left Dorsalis Pedis:  Present  Sensory Foot Exam Completed.:  Yes Semmes-Weinstein Monofilament Test R Site 1-Great Toe:  Pos L Site 1-Great Toe:  Pos        Assessment/Plan:   Problem List Items Addressed This Visit      Endocrine   Type 2 diabetes mellitus ([place])    Foot exam by MD today; I encouraged the patient to schedule an eye exam soon; check A1c and urine microalbumin:Cr      Relevant Orders   Hemoglobin A1c   Microalbumin / creatinine urine ratio     Musculoskeletal and Integument   Osteoarthritis of knee    Patient has seen orthopaedist; was told replacement may be needed in the future; I encouraged him to lose weight      Relevant Medications   HYDROcodone-acetaminophen (NORCO/VICODIN) 5-325 MG tablet   meloxicam (MOBIC) 15 MG tablet   methotrexate (RHEUMATREX) 2.5 MG tablet     Other   Preventative health care - Primary    USPSTF grade A and B recommendations reviewed with patient; age-appropriate recommendations, preventive care, screening tests, etc discussed and encouraged; healthy living encouraged; see AVS for patient education given to patient       Relevant Orders    CBC with Differential/Platelet   COMPLETE METABOLIC PANEL WITH GFR   Lipid panel   TSH   Obesity    Encouraged weight loss, see AVS      Hyperlipidemia LDL goal <100    Check fasting lipids; goal LDL less than 100 at least, and under 70 would be ideal      Encounter for tobacco use screening    At first, I thought cotinine was required on form for his employer, then saw it said "n/a" so the test was canceled      Colon cancer screening    UTD         No orders of the defined types were placed in this encounter.  Orders Placed This Encounter  Procedures  . Hemoglobin A1c  . CBC with Differential/Platelet  . COMPLETE METABOLIC PANEL WITH GFR  . Lipid panel  . TSH  . Microalbumin / creatinine urine ratio    Follow up plan: Return in about 1 year (around 05/09/2018) for complete physical; 3 months for fasting visit with Dr. Sanda Klein.  An After Visit Summary was printed and given to the patient.

## 2017-05-09 NOTE — Assessment & Plan Note (Signed)
Foot exam by MD today; I encouraged the patient to schedule an eye exam soon; check A1c and urine microalbumin:Cr

## 2017-05-09 NOTE — Assessment & Plan Note (Signed)
USPSTF grade A and B recommendations reviewed with patient; age-appropriate recommendations, preventive care, screening tests, etc discussed and encouraged; healthy living encouraged; see AVS for patient education given to patient  

## 2017-05-09 NOTE — Assessment & Plan Note (Signed)
Check fasting lipids; goal LDL less than 100 at least, and under 70 would be ideal

## 2017-05-09 NOTE — Assessment & Plan Note (Addendum)
Encouraged weight loss, see AVS 

## 2017-05-09 NOTE — Patient Instructions (Addendum)
Try to lose 15 pounds over the next 3-4 months Do not take any ibuprofen at all Only meloxicam Tylenol is fine per package directions Take your aspirin at least one hour before the meloxicam  Try to limit saturated fats in your diet (bologna, hot dogs, barbeque, cheeseburgers, hamburgers, steak, bacon, sausage, cheese, etc.) and get more fresh fruits, vegetables, and whole grains  Please do see your eye doctor regularly, and have your eyes examined every year (or more often per his or her recommendation) Check your feet every night and let me know right away of any sores, infections, numbness, etc. Try to limit sweets, white bread, white rice, white potatoes It is okay with me for you to not check your fingerstick blood sugars (per [employer] of Endocrinology Best Practices), unless you are interested and feel it would be helpful for you  Check out the information at familydoctor.org entitled "Nutrition for Weight Loss: What You Need to Know about Fad Diets" Try to lose between 1-2 pounds per week by taking in fewer calories and burning off more calories You can succeed by limiting portions, limiting foods dense in calories and fat, becoming more active, and drinking 8 glasses of water a day (64 ounces) Don't skip meals, especially breakfast, as skipping meals may alter your metabolism Do not use over-the-counter weight loss pills or gimmicks that claim rapid weight loss A healthy BMI (or body mass index) is between 18.5 and 24.9 You can calculate your ideal BMI at the [place] website ClubMon[employer].fr   Health Maintenance, Male A healthy lifestyle and preventive care is important for your health and wellness. Ask your health care provider about what schedule of regular examinations is right for you. What should I know about weight and diet? Eat a Healthy Diet  Eat plenty of vegetables, fruits, whole grains, low-fat dairy products, and  lean protein.  Do not eat a lot of foods high in solid fats, added sugars, or salt.  Maintain a Healthy Weight Regular exercise can help you achieve or maintain a healthy weight. You should:  Do at least 150 minutes of exercise each week. The exercise should increase your heart rate and make you sweat (moderate-intensity exercise).  Do strength-training exercises at least twice a week.  Watch Your Levels of Cholesterol and Blood Lipids  Have your blood tested for lipids and cholesterol every 5 years starting at 52 years of age. If you are at high risk for heart disease, you should start having your blood tested when you are 52 years old. You may need to have your cholesterol levels checked more often if: ? Your lipid or cholesterol levels are high. ? You are older than 52 years of age. ? You are at high risk for heart disease.  What should I know about cancer screening? Many types of cancers can be detected early and may often be prevented. Lung Cancer  You should be screened every year for lung cancer if: ? You are a current smoker who has smoked for at least 30 years. ? You are a former smoker who has quit within the past 15 years.  Talk to your health care provider about your screening options, when you should start screening, and how often you should be screened.  Colorectal Cancer  Routine colorectal cancer screening usually begins at 52 years of age and should be repeated every 5-10 years until you are 52 years old. You may need to be screened more often if early forms of precancerous polyps  or small growths are found. Your health care provider may recommend screening at an earlier age if you have risk factors for colon cancer.  Your health care provider may recommend using home test kits to check for hidden blood in the stool.  A small camera at the end of a tube can be used to examine your colon (sigmoidoscopy or colonoscopy). This checks for the earliest forms of colorectal  cancer.  Prostate and Testicular Cancer  Depending on your age and overall health, your health care provider may do certain tests to screen for prostate and testicular cancer.  Talk to your health care provider about any symptoms or concerns you have about testicular or prostate cancer.  Skin Cancer  Check your skin from head to toe regularly.  Tell your health care provider about any new moles or changes in moles, especially if: ? There is a change in a mole's size, shape, or color. ? You have a mole that is larger than a pencil eraser.  Always use sunscreen. Apply sunscreen liberally and repeat throughout the day.  Protect yourself by wearing long sleeves, pants, a wide-brimmed hat, and sunglasses when outside.  What should I know about heart disease, diabetes, and high blood pressure?  If you are 46-76 years of age, have your blood pressure checked every 3-5 years. If you are 7 years of age or older, have your blood pressure checked every year. You should have your blood pressure measured twice-once when you are at a hospital or clinic, and once when you are not at a hospital or clinic. Record the average of the two measurements. To check your blood pressure when you are not at a hospital or clinic, you can use: ? An automated blood pressure machine at a pharmacy. ? A home blood pressure monitor.  Talk to your health care provider about your target blood pressure.  If you are between 34-27 years old, ask your health care provider if you should take aspirin to prevent heart disease.  Have regular diabetes screenings by checking your fasting blood sugar level. ? If you are at a normal weight and have a low risk for diabetes, have this test once every three years after the age of 52. ? If you are overweight and have a high risk for diabetes, consider being tested at a younger age or more often.  A one-time screening for abdominal aortic aneurysm (AAA) by ultrasound is recommended  for men aged 21-75 years who are current or former smokers. What should I know about preventing infection? Hepatitis B If you have a higher risk for hepatitis B, you should be screened for this virus. Talk with your health care provider to find out if you are at risk for hepatitis B infection. Hepatitis C Blood testing is recommended for:  Everyone born from [birth year] through 1966.  Anyone with known risk factors for hepatitis C.  Sexually Transmitted Diseases (STDs)  You should be screened each year for STDs including gonorrhea and chlamydia if: ? You are sexually active and are younger than 52 years of age. ? You are older than 52 years of age and your health care provider tells you that you are at risk for this type of infection. ? Your sexual activity has changed since you were last screened and you are at an increased risk for chlamydia or gonorrhea. Ask your health care provider if you are at risk.  Talk with your health care provider about whether you are at high risk  of being infected with HIV. Your health care provider may recommend a prescription medicine to help prevent HIV infection.  What else can I do?  Schedule regular health, dental, and eye exams.  Stay current with your vaccines (immunizations).  Do not use any tobacco products, such as cigarettes, chewing tobacco, and e-cigarettes. If you need help quitting, ask your health care provider.  Limit alcohol intake to no more than 2 drinks per day. One drink equals 12 ounces of beer, 5 ounces of wine, or 1 ounces of hard liquor.  Do not use street drugs.  Do not share needles.  Ask your health care provider for help if you need support or information about quitting drugs.  Tell your health care provider if you often feel depressed.  Tell your health care provider if you have ever been abused or do not feel safe at home. This information is not intended to replace advice given to you by your health care provider. Make  sure you discuss any questions you have with your health care provider. Document Released: 10/28/2007 Document Revised: 12/29/2015 Document Reviewed: 02/02/2015 Elsevier Interactive Patient Education  [employer].

## 2017-05-09 NOTE — Assessment & Plan Note (Addendum)
At first, I thought cotinine was required on form for his employer, then saw it said "n/a" so the test was canceled

## 2017-05-09 NOTE — Assessment & Plan Note (Signed)
Patient has seen orthopaedist; was told replacement may be needed in the future; I encouraged him to lose weight

## 2017-05-10 ENCOUNTER — Other Ambulatory Visit: Payer: Self-pay | Admitting: Family Medicine

## 2017-05-10 LAB — COMPLETE METABOLIC PANEL WITH GFR
AG Ratio: 1.9 (calc) (ref 1.0–2.5)
ALT: 18 U/L (ref 9–46)
AST: 17 U/L (ref 10–35)
Albumin: 4.6 g/dL (ref 3.6–5.1)
Alkaline phosphatase (APISO): 60 U/L (ref 40–115)
BUN/Creatinine Ratio: 28 (calc) — ABNORMAL HIGH (ref 6–22)
BUN: 19 mg/dL (ref 7–25)
CO2: 28 mmol/L (ref 20–32)
Calcium: 9.7 mg/dL (ref 8.6–10.3)
Chloride: 102 mmol/L (ref 98–110)
Creat: 0.68 mg/dL — ABNORMAL LOW (ref 0.70–1.33)
GFR, Est African American: 127 mL/min/{1.73_m2} (ref >=60)
GFR, Est Non African American: 110 mL/min/{1.73_m2} (ref >=60)
Globulin: 2.4 g/dL (calc) (ref 1.9–3.7)
Glucose, Bld: 136 mg/dL — ABNORMAL HIGH (ref 65–99)
Potassium: 4.7 mmol/L (ref 3.5–5.3)
Sodium: 137 mmol/L (ref 135–146)
Total Bilirubin: 0.5 mg/dL (ref 0.2–1.2)
Total Protein: 7 g/dL (ref 6.1–8.1)

## 2017-05-10 LAB — LIPID PANEL
Cholesterol: 285 mg/dL — ABNORMAL HIGH (ref <200)
HDL: 66 mg/dL (ref >40)
LDL Cholesterol (Calc): 191 mg/dL (calc) — ABNORMAL HIGH
Non-HDL Cholesterol (Calc): 219 mg/dL (calc) — ABNORMAL HIGH (ref <130)
Total CHOL/HDL Ratio: 4.3 (calc) (ref <5.0)
Triglycerides: 139 mg/dL (ref <150)

## 2017-05-10 LAB — MICROALBUMIN / CREATININE URINE RATIO
Creatinine, Urine: 70 mg/dL (ref 20–320)
Microalb Creat Ratio: 20 mcg/mg creat (ref <30)
Microalb, Ur: 1.4 mg/dL

## 2017-05-10 LAB — CBC WITH DIFFERENTIAL/PLATELET
Basophils Absolute: 50 cells/uL (ref 0–200)
Basophils Relative: 0.6 %
Eosinophils Absolute: 84 cells/uL (ref 15–500)
Eosinophils Relative: 1 %
HCT: 44.6 % (ref 38.5–50.0)
Hemoglobin: 15.4 g/dL (ref 13.2–17.1)
Lymphs Abs: 2831 cells/uL (ref 850–3900)
MCH: 30.3 pg (ref 27.0–33.0)
MCHC: 34.5 g/dL (ref 32.0–36.0)
MCV: 87.8 fL (ref 80.0–100.0)
MPV: 10.6 fL (ref 7.5–12.5)
Monocytes Relative: 8 %
Neutro Abs: 4763 cells/uL (ref 1500–7800)
Neutrophils Relative %: 56.7 %
Platelets: 239 10*3/uL (ref 140–400)
RBC: 5.08 10*6/uL (ref 4.20–5.80)
RDW: 12.8 % (ref 11.0–15.0)
Total Lymphocyte: 33.7 %
WBC mixed population: 672 cells/uL (ref 200–950)
WBC: 8.4 10*3/uL (ref 3.8–10.8)

## 2017-05-10 LAB — TSH: TSH: 0.8 mIU/L (ref 0.40–4.50)

## 2017-05-10 LAB — HEMOGLOBIN A1C
Hgb A1c MFr Bld: 8.4 % of total Hgb — ABNORMAL HIGH (ref <5.7)
Mean Plasma Glucose: 194 (calc)
eAG (mmol/L): 10.8 (calc)

## 2017-05-10 MED ORDER — PIOGLITAZONE HCL 30 MG PO TABS: 30.0000 mg | ORAL_TABLET | Freq: Every day | ORAL | 5 refills | Status: DC

## 2017-05-10 MED ORDER — ROSUVASTATIN CALCIUM 5 MG PO TABS: 5.0000 mg | ORAL_TABLET | Freq: Every day | ORAL | 2 refills | Status: DC

## 2017-05-10 NOTE — Progress Notes (Signed)
Continue metformin, invokana, januvia, and increase the [employer] lower dose (did not tolerate moderate dose atorvastatin)

## 2017-05-12 ENCOUNTER — Other Ambulatory Visit: Payer: Self-pay | Admitting: Family Medicine

## 2017-05-14 NOTE — Telephone Encounter (Signed)
Last Cr reviewed; Rx approved 

## 2017-06-07 ENCOUNTER — Other Ambulatory Visit: Payer: Self-pay | Admitting: Family Medicine

## 2017-06-14 ENCOUNTER — Telehealth: Payer: Self-pay | Admitting: Family Medicine

## 2017-06-14 MED ORDER — EMPAGLIFLOZIN 10 MG PO TABS: 10.0000 mg | ORAL_TABLET | Freq: Every day | ORAL | 5 refills | Status: DC

## 2017-06-14 NOTE — Telephone Encounter (Signed)
Apparently, Invokana not covered by insurance Let pt know we're switching to [employer]

## 2017-06-14 NOTE — Telephone Encounter (Signed)
Copied from [id number]. Topic: Quick Communication - Rx Refill/Question >> Jun 14, 2017  3:34 PM Aurelio Brash B wrote: Pt will be out of med on Saturday  Medication:INVOKANA 300 MG TABS tablet   Has the patient contacted their pharmacy? yes  (Agent: If no, request that the patient contact the pharmacy for the refill.)   Preferred Pharmacy (with phone number or street name): [facility] [place], [address] AT [address] [phone] (Phone) [phone] (Fax)     Agent: Please be advised that RX refills may take up to 3 business days. We ask that you follow-up with your pharmacy.

## 2017-06-15 NOTE — Telephone Encounter (Signed)
Pt.notified

## 2017-07-08 ENCOUNTER — Other Ambulatory Visit: Payer: Self-pay | Admitting: Family Medicine

## 2017-07-09 NOTE — Telephone Encounter (Signed)
Last Cr reviewed; Rxs approved 

## 2017-07-11 ENCOUNTER — Ambulatory Visit: Payer: BLUE CROSS/BLUE SHIELD | Admitting: Family Medicine

## 2017-07-19 ENCOUNTER — Encounter: Payer: Self-pay | Admitting: Family Medicine

## 2017-07-25 ENCOUNTER — Ambulatory Visit (INDEPENDENT_AMBULATORY_CARE_PROVIDER_SITE_OTHER): Payer: BLUE CROSS/BLUE SHIELD | Admitting: Family Medicine

## 2017-07-25 ENCOUNTER — Encounter: Payer: Self-pay | Admitting: Family Medicine

## 2017-07-25 VITALS — BP 120/82 | HR 84 | Temp 98.0°F | Resp 14 | Wt 232.6 lb

## 2017-07-25 DIAGNOSIS — Z0189 Encounter for other specified special examinations: Secondary | ICD-10-CM | POA: Diagnosis not present

## 2017-07-25 DIAGNOSIS — R310 Gross hematuria: Secondary | ICD-10-CM

## 2017-07-25 DIAGNOSIS — E785 Hyperlipidemia, unspecified: Secondary | ICD-10-CM | POA: Diagnosis not present

## 2017-07-25 DIAGNOSIS — M170 Bilateral primary osteoarthritis of knee: Secondary | ICD-10-CM | POA: Diagnosis not present

## 2017-07-25 DIAGNOSIS — E6609 Other obesity due to excess calories: Secondary | ICD-10-CM

## 2017-07-25 DIAGNOSIS — R1031 Right lower quadrant pain: Secondary | ICD-10-CM | POA: Diagnosis not present

## 2017-07-25 DIAGNOSIS — L409 Psoriasis, unspecified: Secondary | ICD-10-CM | POA: Diagnosis not present

## 2017-07-25 DIAGNOSIS — Z5181 Encounter for therapeutic drug level monitoring: Secondary | ICD-10-CM | POA: Diagnosis not present

## 2017-07-25 DIAGNOSIS — Z6831 Body mass index (BMI) 31.0-31.9, adult: Secondary | ICD-10-CM | POA: Diagnosis not present

## 2017-07-25 DIAGNOSIS — E1165 Type 2 diabetes mellitus with hyperglycemia: Secondary | ICD-10-CM | POA: Diagnosis not present

## 2017-07-25 DIAGNOSIS — K219 Gastro-esophageal reflux disease without esophagitis: Secondary | ICD-10-CM

## 2017-07-25 NOTE — Assessment & Plan Note (Signed)
Continue taking zantac daily, if symptoms worsen can take BID, discussed avoiding food triggers to reduce need for medication

## 2017-07-25 NOTE — Assessment & Plan Note (Signed)
Discussed diet and exercise, will recheck Hemoglobin A1C and adjust meds as necessary

## 2017-07-25 NOTE — Assessment & Plan Note (Signed)
Encouraged pt to work on weight loss, even 10 pounds may help improve his numbers and allow [place] to see him every 6 months instead of every 3

## 2017-07-25 NOTE — Assessment & Plan Note (Signed)
On MTX, topical

## 2017-07-25 NOTE — Assessment & Plan Note (Signed)
Discussed Lipid goals and diet changes, Will recheck lipids today

## 2017-07-25 NOTE — Assessment & Plan Note (Signed)
Monitor sgpt and renal function

## 2017-07-25 NOTE — Assessment & Plan Note (Signed)
Patient follows up with [facility]- will request there notes. Continue follow-up. Will monitor kidney function on meloxicam

## 2017-07-25 NOTE — Progress Notes (Signed)
BP 120/82   Pulse 84   Temp 98 F (36.7 C) (Oral)   Resp 14   Wt 232 lb 9.6 oz (105.5 kg)   SpO2 96%   BMI 31.44 kg/m    Subjective:    Patient ID: Matthew Woodard, male    DOB: 1965-10-22, 52 y.o.   MRN: [id number]  HPI: Matthew Woodard Woodard is a 52 y.o. male  Chief Complaint  Patient presents with  . Follow-up  . 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 Patient  went to [facility] for left knee a few months ago and got injections and has been taking  meloxicam with moderate relief of symptoms. Since he still has a very physical job but notices relief when he is off work.   DM2/Obesity/Hyperlipidemia  Metformin, Januvia, jardiance pioglitazone for DM management- states he takes them all in the evening. Checks BS intermittently ranging from 98-146. Diet: eats a lot of red meats and whole grain bread- tries to steer away from white rice and potatoes. He cut nightly frosty out. States he is always on the road so has been used to grabbing fast food but has cut down significantly. Patient has an active job but doesn't spend time soley exercising. Patient taking fish oils and crestor nightly- denies muscles cramps.  Urine microalbumin:Cr normal on 05/09/17  GERD Patient takes OTC zantac once day. Pt notices triggers- tomato sauce, slow-cooked foods.  History of Tobacco Use Patient quit smoking 4.5 years over 30 pack year pack history- sts 2 packs/day for 30+ years. Pt sts feels like he breath.   Psoriasis Patient says he does not have eczema; is on MTX for his psoriasis  Hematuria Bright red blood in the urine; pain on Thursday when driving back from [place], couldn't get comfortable; got home and had blood in his urine; no hx of kidney stone; no pain today; no fam hx of kidney stones  Depression screen [facility] 2/9 07/25/2017 05/09/2017 01/08/2017 06/08/2016 10/12/2015  Decreased Interest 0 0 0 0 0  Down, Depressed,  Hopeless 0 0 0 0 0  PHQ - 2 Score 0 0 0 0 0    Relevant past medical, surgical, family and social history reviewed Past Medical History:  Diagnosis Date  . Arthritis    hands, left knee  . Diabetes mellitus without complication ([place])   . Eczema 01/08/2017  . Hyperlipidemia LDL goal <100 11/02/2014  . Hypertension   . Kidney stone   . Wears dentures    full upper, partial lower.  Has, does not wear   Past Surgical History:  Procedure Laterality Date  . COLONOSCOPY WITH PROPOFOL N/A 02/23/2017   Procedure: COLONOSCOPY WITH PROPOFOL;  Surgeon: [name], MD;  Location: [place];  Service: Endoscopy;  Laterality: N/A;  Diabetic - oral meds  . CYST EXCISION  Aug. 2016   on back  . KNEE SURGERY    . NASAL SINUS SURGERY  03/15  . POLYPECTOMY N/A 02/23/2017   Procedure: POLYPECTOMY;  Surgeon: [name], MD;  Location: [place];  Service: Endoscopy;  Laterality: N/A;  . tooth abstraction    . VARICOCELE EXCISION     Family History  Problem Relation Age of Onset  . Heart disease Father   . Hypertension Father   . COPD Father   . Colon cancer Maternal Grandmother   . Heart attack Paternal Grandfather    Social  History   Tobacco Use  . Smoking status: Former Smoker    Types: Cigarettes    Last attempt to quit: 06/15/2013    Years since quitting: 4.1  . Smokeless tobacco: Never Used  Substance Use Topics  . Alcohol use: No    Alcohol/week: 0.0 oz  . Drug use: No    Interim medical history since last visit reviewed. Allergies and medications reviewed  Review of Systems Per HPI unless specifically indicated above     Objective:    BP 120/82   Pulse 84   Temp 98 F (36.7 C) (Oral)   Resp 14   Wt 232 lb 9.6 oz (105.5 kg)   SpO2 96%   BMI 31.44 kg/m   Wt Readings from Last 3 Encounters:  07/25/17 232 lb 9.6 oz (105.5 kg)  05/09/17 229 lb 4.8 oz (104 kg)  02/23/17 222 lb (100.7 kg)    Physical Exam  Constitutional: He appears  well-developed and well-nourished. No distress.  HENT:  Head: Normocephalic and atraumatic.  Nose: Nose normal.  Mouth/Throat: Oropharynx is clear and moist.  Eyes: EOM are normal. No scleral icterus.  Neck: No JVD present. No thyromegaly present.  Cardiovascular: Normal rate, regular rhythm and normal heart sounds.  Pulmonary/Chest: Effort normal and breath sounds normal. No respiratory distress. He has no wheezes. He has no rales.  Abdominal: Soft. Bowel sounds are normal. He exhibits no distension. There is no tenderness. There is no guarding.  Musculoskeletal: Normal range of motion. He exhibits no edema.  Wearing brace on left knee  Lymphadenopathy:    He has no cervical adenopathy.  Neurological: He is alert. He displays normal reflexes. He exhibits normal muscle tone. Coordination normal.  Skin: Skin is warm and dry. He is not diaphoretic. No erythema. No pallor.  A few cracks and fissures on the hands, improved relative to previous visits  Psychiatric: He has a normal mood and affect. His behavior is normal. Judgment and thought content normal. His mood appears not anxious. He does not exhibit a depressed mood.   Diabetic Foot Form - Detailed   Diabetic Foot Exam - detailed Diabetic Foot exam was performed with the following findings:  Yes 07/25/2017 10:40 AM  Visual Foot Exam completed.:  Yes  Pulse Foot Exam completed.:  Yes  Right Dorsalis Pedis:  Present Left Dorsalis Pedis:  Present  Sensory Foot Exam Completed.:  Yes Semmes-Weinstein Monofilament Test R Site 1-Great Toe:  Pos L Site 1-Great Toe:  Pos       Results for orders placed or performed in visit on 05/09/17  Hemoglobin A1c  Result Value Ref Range   Hgb A1c MFr Bld 8.4 (H) <5.7 % of total Hgb   Mean Plasma Glucose 194 (calc)   eAG (mmol/L) 10.8 (calc)  CBC with Differential/Platelet  Result Value Ref Range   WBC 8.4 3.8 - 10.8 Thousand/uL   RBC 5.08 4.20 - 5.80 Million/uL   Hemoglobin 15.4 13.2 - 17.1 g/dL    HCT 44.6 38.5 - 50.0 %   MCV 87.8 80.0 - 100.0 fL   MCH 30.3 27.0 - 33.0 pg   MCHC 34.5 32.0 - 36.0 g/dL   RDW 12.8 11.0 - 15.0 %   Platelets 239 140 - 400 Thousand/uL   MPV 10.6 7.5 - 12.5 fL   Neutro Abs 4,763 1,500 - 7,800 cells/uL   Lymphs Abs 2,831 850 - 3,900 cells/uL   WBC mixed population 672 200 - 950 cells/uL   Eosinophils Absolute  84 15 - 500 cells/uL   Basophils Absolute 50 0 - 200 cells/uL   Neutrophils Relative % 56.7 %   Total Lymphocyte 33.7 %   Monocytes Relative 8.0 %   Eosinophils Relative 1.0 %   Basophils Relative 0.6 %  COMPLETE METABOLIC PANEL WITH GFR  Result Value Ref Range   Glucose, Bld 136 (H) 65 - 99 mg/dL   BUN 19 7 - 25 mg/dL   Creat 0.68 (L) 0.70 - 1.33 mg/dL   GFR, Est Non African American 110 > OR = 60 mL/min/1.22m2   GFR, Est African American 127 > OR = 60 mL/min/1.12m2   BUN/Creatinine Ratio 28 (H) 6 - 22 (calc)   Sodium 137 135 - 146 mmol/L   Potassium 4.7 3.5 - 5.3 mmol/L   Chloride 102 98 - 110 mmol/L   CO2 28 20 - 32 mmol/L   Calcium 9.7 8.6 - 10.3 mg/dL   Total Protein 7.0 6.1 - 8.1 g/dL   Albumin 4.6 3.6 - 5.1 g/dL   Globulin 2.4 1.9 - 3.7 g/dL (calc)   AG Ratio 1.9 1.0 - 2.5 (calc)   Total Bilirubin 0.5 0.2 - 1.2 mg/dL   Alkaline phosphatase (APISO) 60 40 - 115 U/L   AST 17 10 - 35 U/L   ALT 18 9 - 46 U/L  Lipid panel  Result Value Ref Range   Cholesterol 285 (H) <200 mg/dL   HDL 66 >40 mg/dL   Triglycerides 139 <150 mg/dL   LDL Cholesterol (Calc) 191 (H) mg/dL (calc)   Total CHOL/HDL Ratio 4.3 <5.0 (calc)   Non-HDL Cholesterol (Calc) 219 (H) <130 mg/dL (calc)  TSH  Result Value Ref Range   TSH 0.80 0.40 - 4.50 mIU/L  Microalbumin / creatinine urine ratio  Result Value Ref Range   Creatinine, Urine 70 20 - 320 mg/dL   Microalb, Ur 1.4 mg/dL   Microalb Creat Ratio 20 <30 mcg/mg creat      Assessment & Plan:   Problem List Items Addressed This Visit      Digestive   Acid reflux    Continue taking zantac daily,  if symptoms worsen can take BID, discussed avoiding food triggers to reduce need for medication        Endocrine   Type 2 diabetes mellitus (HCC) - Primary (Chronic)    Discussed diet and exercise, will recheck Hemoglobin A1C and adjust meds as necessary       Relevant Orders   COMPLETE METABOLIC PANEL WITH GFR   HgB A1c     Musculoskeletal and Integument   Psoriasis (Chronic)    On MTX, topical      Osteoarthritis of knee (Chronic)    Patient follows up with [facility]- will request there notes. Continue follow-up. Will monitor kidney function on meloxicam         Other   Encounter for tobacco use screening   Obesity (Chronic)    Encouraged pt to work on weight loss, even 10 pounds may help improve his numbers and allow [place] to see him every 6 months instead of every 3      Medication monitoring encounter    Monitor sgpt and renal function      Relevant Orders   COMPLETE METABOLIC PANEL WITH GFR   Hyperlipidemia LDL goal <100 (Chronic)    Discussed Lipid goals and diet changes, Will recheck lipids today       Relevant Orders   Lipid Profile    Other Visit Diagnoses  Gross hematuria       sounds like he had a kidney stone; will get CT renal stone work-up and urine today; if symptoms recur, call or seek help if very painful   Relevant Orders   Urinalysis w microscopic + reflex cultur   Right lower quadrant abdominal pain       CT renal stone study; check urine today   Relevant Orders   CT RENAL STONE STUDY       Follow up plan: Return in about 3 months (around 10/25/2017) for follow-up visit with Dr. Sanda Klein.  An after-visit summary was printed and given to the patient at [facility].  Please see the patient instructions which may contain other information and recommendations beyond what is mentioned above in the assessment and plan.  No orders of the defined types were placed in this encounter.   Orders Placed This Encounter  Procedures  . CT RENAL STONE  STUDY  . Lipid Profile  . COMPLETE METABOLIC PANEL WITH GFR  . HgB A1c  . Urinalysis w microscopic + reflex cultur

## 2017-07-25 NOTE — Patient Instructions (Addendum)
Please do see your eye doctor regularly, and have your eyes examined every year (or more often per his or her recommendation) Check your feet every night and let me know right away of any sores, infections, numbness, etc. Try to limit sweets, white bread, white rice, white potatoes It is okay with me for you to not check your fingerstick blood sugars (per [employer] of [employer]ocrinology Best Practices), unless you are interested and feel it would be helpful for you Try to limit saturated fats in your diet (bologna, hot dogs, barbeque, cheeseburgers, hamburgers, steak, bacon, sausage, cheese, etc.) and get more fresh fruits, vegetables, and whole grains Try to drink more water If the bleeding recurs, let us know

## 2017-07-26 LAB — COMPLETE METABOLIC PANEL WITH GFR
AG Ratio: 1.8 (calc) (ref 1.0–2.5)
ALT: 15 U/L (ref 9–46)
AST: 16 U/L (ref 10–35)
Albumin: 4.4 g/dL (ref 3.6–5.1)
Alkaline phosphatase (APISO): 54 U/L (ref 40–115)
BUN: 22 mg/dL (ref 7–25)
CO2: 27 mmol/L (ref 20–32)
Calcium: 9.5 mg/dL (ref 8.6–10.3)
Chloride: 102 mmol/L (ref 98–110)
Creat: 0.78 mg/dL (ref 0.70–1.33)
GFR, Est African American: 120 mL/min/{1.73_m2} (ref >=60)
GFR, Est Non African American: 104 mL/min/{1.73_m2} (ref >=60)
Globulin: 2.4 g/dL (calc) (ref 1.9–3.7)
Glucose, Bld: 121 mg/dL (ref 65–139)
Potassium: 4.3 mmol/L (ref 3.5–5.3)
Sodium: 137 mmol/L (ref 135–146)
Total Bilirubin: 0.7 mg/dL (ref 0.2–1.2)
Total Protein: 6.8 g/dL (ref 6.1–8.1)

## 2017-07-26 LAB — URINALYSIS W MICROSCOPIC + REFLEX CULTURE
Bacteria, UA: NONE SEEN /HPF
Bilirubin Urine: NEGATIVE
Hyaline Cast: NONE SEEN /LPF
Ketones, ur: NEGATIVE
Leukocyte Esterase: NEGATIVE
Nitrites, Initial: NEGATIVE
Protein, ur: NEGATIVE
Specific Gravity, Urine: 1.042 — ABNORMAL HIGH (ref 1.001–1.03)
Squamous Epithelial / LPF: NONE SEEN /HPF (ref <=5)
WBC, UA: NONE SEEN /HPF (ref 0–5)
pH: <=5 (ref 5.0–8.0)

## 2017-07-26 LAB — NO CULTURE INDICATED

## 2017-07-26 LAB — LIPID PANEL
Cholesterol: 155 mg/dL (ref <200)
HDL: 60 mg/dL (ref >40)
LDL Cholesterol (Calc): 80 mg/dL (calc)
Non-HDL Cholesterol (Calc): 95 mg/dL (calc) (ref <130)
Total CHOL/HDL Ratio: 2.6 (calc) (ref <5.0)
Triglycerides: 72 mg/dL (ref <150)

## 2017-07-26 LAB — HEMOGLOBIN A1C
Hgb A1c MFr Bld: 7.5 % of total Hgb — ABNORMAL HIGH (ref <5.7)
Mean Plasma Glucose: 169 (calc)
eAG (mmol/L): 9.3 (calc)

## 2017-07-27 ENCOUNTER — Telehealth: Payer: Self-pay

## 2017-07-27 NOTE — Telephone Encounter (Signed)
I tried to contact this patient to inform him that he has been scheduled to have his CT Renal Laren Everts on Monday, August 06, 2017 at 8:30am at the [facility] (nothing to eat or drink after midnight), but there was no answer and I was not able to leave a message.  I will put in a CRM  And will send a MyChart message.

## 2017-08-06 ENCOUNTER — Ambulatory Visit: Payer: BLUE CROSS/BLUE SHIELD

## 2017-08-14 ENCOUNTER — Other Ambulatory Visit: Payer: Self-pay | Admitting: Family Medicine

## 2017-08-15 NOTE — Telephone Encounter (Signed)
Last lipids and sgpt reviewed; Rx approved for one year 

## 2017-10-25 ENCOUNTER — Ambulatory Visit: Payer: BLUE CROSS/BLUE SHIELD | Admitting: Family Medicine

## 2017-11-17 ENCOUNTER — Telehealth: Payer: Self-pay | Admitting: Family Medicine

## 2017-11-19 NOTE — Telephone Encounter (Signed)
Please ask patient to schedule an appointment in the next month I'll refill medicine

## 2017-11-19 NOTE — Telephone Encounter (Signed)
Spoke with patient and appt made for 12-04-17

## 2017-12-04 ENCOUNTER — Ambulatory Visit (INDEPENDENT_AMBULATORY_CARE_PROVIDER_SITE_OTHER): Payer: BLUE CROSS/BLUE SHIELD | Admitting: Family Medicine

## 2017-12-04 ENCOUNTER — Encounter: Payer: Self-pay | Admitting: Family Medicine

## 2017-12-04 VITALS — BP 100/70 | HR 76 | Temp 98.4°F | Resp 16 | Ht 72.0 in | Wt 231.1 lb

## 2017-12-04 DIAGNOSIS — E6609 Other obesity due to excess calories: Secondary | ICD-10-CM | POA: Diagnosis not present

## 2017-12-04 DIAGNOSIS — E1165 Type 2 diabetes mellitus with hyperglycemia: Secondary | ICD-10-CM

## 2017-12-04 DIAGNOSIS — Z6831 Body mass index (BMI) 31.0-31.9, adult: Secondary | ICD-10-CM

## 2017-12-04 DIAGNOSIS — L409 Psoriasis, unspecified: Secondary | ICD-10-CM | POA: Diagnosis not present

## 2017-12-04 DIAGNOSIS — E113293 Type 2 diabetes mellitus with mild nonproliferative diabetic retinopathy without macular edema, bilateral: Secondary | ICD-10-CM

## 2017-12-04 DIAGNOSIS — E785 Hyperlipidemia, unspecified: Secondary | ICD-10-CM | POA: Diagnosis not present

## 2017-12-04 DIAGNOSIS — Z5181 Encounter for therapeutic drug level monitoring: Secondary | ICD-10-CM

## 2017-12-04 DIAGNOSIS — R310 Gross hematuria: Secondary | ICD-10-CM

## 2017-12-04 MED ORDER — METFORMIN HCL ER 500 MG PO TB24: 2000.0000 mg | ORAL_TABLET | Freq: Every day | ORAL | 1 refills | Status: DC

## 2017-12-04 MED ORDER — LISINOPRIL 10 MG PO TABS: 10.0000 mg | ORAL_TABLET | Freq: Every day | ORAL | 1 refills | Status: DC

## 2017-12-04 MED ORDER — EMPAGLIFLOZIN 10 MG PO TABS: 10.0000 mg | ORAL_TABLET | Freq: Every day | ORAL | 3 refills | Status: DC

## 2017-12-04 NOTE — Patient Instructions (Signed)
Try to limit saturated fats in your diet (bologna, hot dogs, barbeque, cheeseburgers, hamburgers, steak, bacon, sausage, cheese, etc.) and get more fresh fruits, vegetables, and whole grains Please do see your eye doctor regularly, and have your eyes examined every year (or more often per his or her recommendation) Check your feet every night and let me know right away of any sores, infections, numbness, etc. Try to limit sweets, white bread, white rice, white potatoes It is okay with me for you to not check your fingerstick blood sugars (per [employer]in[employer] Best Practices), unless you are interested and feel it would be helpful for you Check out the information at familydoctor.org entitled "Nutrition for Weight Loss: What You Need to Know about Fad Diets" Try to lose between 1-2 pounds per week by taking in fewer calories and burning off more calories You can succeed by limiting portions, limiting foods dense in calories and fat, becoming more active, and drinking 8 glasses of water a day (64 ounces) Don't skip meals, especially breakfast, as skipping meals may alter your metabolism Do not use over-the-counter weight loss pills or gimmicks that claim rapid weight loss A healthy BMI (or body mass index) is between 18.5 and 24.9 You can calculate your ideal BMI at the [place] website [employer]

## 2017-12-04 NOTE — Assessment & Plan Note (Signed)
On MTX

## 2017-12-04 NOTE — Progress Notes (Signed)
BP 100/70 (BP Location: Right Arm, Patient Position: Sitting, Cuff Size: Normal)   Pulse 76   Temp 98.4 F (36.9 C) (Oral)   Resp 16   Ht 6' (1.829 m)   Wt 231 lb 1.6 oz (104.8 kg)   SpO2 98%   BMI 31.34 kg/m    Subjective:    Patient ID: Matthew Woodard, male    DOB: [birth year], 52 y.o.   MRN: [id number]  HPI: Matthew Woodard is a 52 y.o. male  Chief Complaint  Patient presents with  . Medication Refill    HPI Patient is here for f/u Type 2 diabetes; the cost of the [place] went up significantly; still taking other medicines; due for eye exam  High cholesterol; on statin  He is not having any symptoms and has not had any blood in his urine; he got overheated on the job site [employer] said that the site of the xray was expensive and could that be reschedule  Obesity; down just a little since last visit; trying to cut back, but wife likes sweets  Depression screen [facility] 2/9 12/04/2017 07/25/2017 05/09/2017 01/08/2017 06/08/2016  Decreased Interest 0 0 0 0 0  Down, Depressed, Hopeless 0 0 0 0 0  PHQ - 2 Score 0 0 0 0 0    Relevant past medical, surgical, family and social history reviewed Past Medical History:  Diagnosis Date  . Arthritis    hands, left knee  . Diabetes mellitus without complication ([place])   . Eczema 01/08/2017  . Hyperlipidemia LDL goal <100 11/02/2014  . Hypertension   . Kidney stone   . Wears dentures    full upper, partial lower.  Has, does not wear   Past Surgical History:  Procedure Laterality Date  . COLONOSCOPY WITH PROPOFOL N/A 02/23/2017   Procedure: COLONOSCOPY WITH PROPOFOL;  Surgeon: [name], MD;  Location: [facility];  Service: Endoscopy;  Laterality: N/A;  Diabetic - oral meds  . CYST EXCISION  Aug. 2016   on back  . KNEE SURGERY    . NASAL SINUS SURGERY  03/15  . POLYPECTOMY N/A 02/23/2017   Procedure: POLYPECTOMY;  Surgeon: [name], MD;  Location: [facility];  Service: Endoscopy;   Laterality: N/A;  . tooth abstraction    . VARICOCELE EXCISION     Family History  Problem Relation Age of Onset  . Heart disease Father   . Hypertension Father   . COPD Father   . Colon cancer Maternal Grandmother   . Heart attack Paternal Grandfather    Social History   Tobacco Use  . Smoking status: Former Smoker    Types: Cigarettes    Last attempt to quit: 06/15/2013    Years since quitting: 4.4  . Smokeless tobacco: Never Used  Substance Use Topics  . Alcohol use: No    Alcohol/week: 0.0 oz  . Drug use: No    Interim medical history since last visit reviewed. Allergies and medications reviewed  Review of Systems Per HPI unless specifically indicated above     Objective:    BP 100/70 (BP Location: Right Arm, Patient Position: Sitting, Cuff Size: Normal)   Pulse 76   Temp 98.4 F (36.9 C) (Oral)   Resp 16   Ht 6' (1.829 m)   Wt 231 lb 1.6 oz (104.8 kg)   SpO2 98%   BMI 31.34 kg/m   Wt Readings from Last 3 Encounters:  12/04/17 231 lb 1.6 oz (  104.8 kg)  07/25/17 232 lb 9.6 oz (105.5 kg)  05/09/17 229 lb 4.8 oz (104 kg)    Physical Exam  Constitutional: He appears well-developed and well-nourished. No distress.  HENT:  Head: Normocephalic and atraumatic.  Eyes: EOM are normal. No scleral icterus.  Neck: No thyromegaly present.  Cardiovascular: Normal rate and regular rhythm.  Pulmonary/Chest: Effort normal and breath sounds normal.  Abdominal: Soft. Bowel sounds are normal. He exhibits no distension.  Musculoskeletal: He exhibits no edema.  Neurological: Coordination normal.  Skin: Skin is warm and dry. No pallor.  Psychiatric: He has a normal mood and affect. His behavior is normal. Judgment and thought content normal.   Diabetic Foot Form - Detailed   Diabetic Foot Exam - detailed Diabetic Foot exam was performed with the following findings:  Yes 12/04/2017  9:05 AM  Visual Foot Exam completed.:  Yes  Pulse Foot Exam completed.:  Yes  Right  Dorsalis Pedis:  Present Left Dorsalis Pedis:  Present  Sensory Foot Exam Completed.:  Yes Semmes-Weinstein Monofilament Test R Site 1-Great Toe:  Pos L Site 1-Great Toe:  Pos        Results for orders placed or performed in visit on 07/25/17  Lipid Profile  Result Value Ref Range   Cholesterol 155 <200 mg/dL   HDL 60 >40 mg/dL   Triglycerides 72 <150 mg/dL   LDL Cholesterol (Calc) 80 mg/dL (calc)   Total CHOL/HDL Ratio 2.6 <5.0 (calc)   Non-HDL Cholesterol (Calc) 95 <130 mg/dL (calc)  COMPLETE METABOLIC PANEL WITH GFR  Result Value Ref Range   Glucose, Bld 121 65 - 139 mg/dL   BUN 22 7 - 25 mg/dL   Creat 0.78 0.70 - 1.33 mg/dL   GFR, Est Non African American 104 > OR = 60 mL/min/1.57m2   GFR, Est African American 120 > OR = 60 mL/min/1.70m2   BUN/Creatinine Ratio NOT APPLICABLE 6 - 22 (calc)   Sodium 137 135 - 146 mmol/L   Potassium 4.3 3.5 - 5.3 mmol/L   Chloride 102 98 - 110 mmol/L   CO2 27 20 - 32 mmol/L   Calcium 9.5 8.6 - 10.3 mg/dL   Total Protein 6.8 6.1 - 8.1 g/dL   Albumin 4.4 3.6 - 5.1 g/dL   Globulin 2.4 1.9 - 3.7 g/dL (calc)   AG Ratio 1.8 1.0 - 2.5 (calc)   Total Bilirubin 0.7 0.2 - 1.2 mg/dL   Alkaline phosphatase (APISO) 54 40 - 115 U/L   AST 16 10 - 35 U/L   ALT 15 9 - 46 U/L  HgB A1c  Result Value Ref Range   Hgb A1c MFr Bld 7.5 (H) <5.7 % of total Hgb   Mean Plasma Glucose 169 (calc)   eAG (mmol/L) 9.3 (calc)  Urinalysis w microscopic + reflex cultur  Result Value Ref Range   Color, Urine YELLOW YELLOW   APPearance CLEAR CLEAR   Specific Gravity, Urine 1.042 (H) 1.001 - 1.03   pH < OR = 5.0 5.0 - 8.0   Glucose, UA 3+ (A) NEGATIVE   Bilirubin Urine NEGATIVE NEGATIVE   Ketones, ur NEGATIVE NEGATIVE   Hgb urine dipstick TRACE (A) NEGATIVE   Protein, ur NEGATIVE NEGATIVE   Nitrites, Initial NEGATIVE NEGATIVE   Leukocyte Esterase NEGATIVE NEGATIVE   WBC, UA NONE SEEN 0 - 5 /HPF   RBC / HPF 0-2 0 - 2 /HPF   Squamous Epithelial / LPF NONE SEEN <  OR = 5 /HPF   Bacteria,  UA NONE SEEN NONE SEEN /HPF   Hyaline Cast NONE SEEN NONE SEEN /LPF  REFLEXIVE URINE CULTURE  Result Value Ref Range   Reflexve Urine Culture NO CULTURE INDICATED       Assessment & Plan:   Problem List Items Addressed This Visit      Endocrine   Type 2 diabetes mellitus ([place]) - Primary (Chronic)    Foot exam by MD today; check A1c and other labs; he cannot afford the Januvia      Relevant Medications   empagliflozin (JARDIANCE) 10 MG TABS tablet   lisinopril (PRINIVIL,ZESTRIL) 10 MG tablet   metFORMIN (GLUCOPHAGE-XR) 500 MG 24 hr tablet   Other Relevant Orders   Hemoglobin A1c   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)    Encouraged yearly eye exam      Relevant Medications   empagliflozin (JARDIANCE) 10 MG TABS tablet   lisinopril (PRINIVIL,ZESTRIL) 10 MG tablet   metFORMIN (GLUCOPHAGE-XR) 500 MG 24 hr tablet     Musculoskeletal and Integument   Psoriasis (Chronic)    On MTX        Other   Obesity (Chronic)    Encouraged weight loss      Relevant Medications   empagliflozin (JARDIANCE) 10 MG TABS tablet   metFORMIN (GLUCOPHAGE-XR) 500 MG 24 hr tablet   Medication monitoring encounter    Check liver and kidneys      Hyperlipidemia LDL goal <100 (Chronic)    Check lipids; try to limit saturated fats      Relevant Medications   lisinopril (PRINIVIL,ZESTRIL) 10 MG tablet    Other Visit Diagnoses    Gross hematuria       Relevant Orders   Urinalysis w microscopic + reflex cultur       Follow up plan: Return in about 3 months (around 03/06/2018) for follow-up visit with Dr. Sanda Klein.  An after-visit summary was printed and given to the patient at [facility].  Please see the patient instructions which may contain other information and recommendations beyond what is mentioned above in the assessment and plan.  Meds ordered this encounter  Medications  . empagliflozin (JARDIANCE) 10 MG TABS tablet    Sig: Take 10 mg  by mouth daily. (this replaces Invokana)    Dispense:  90 tablet    Refill:  3  . lisinopril (PRINIVIL,ZESTRIL) 10 MG tablet    Sig: Take 1 tablet (10 mg total) by mouth daily.    Dispense:  90 tablet    Refill:  1  . metFORMIN (GLUCOPHAGE-XR) 500 MG 24 hr tablet    Sig: Take 4 tablets (2,000 mg total) by mouth daily.    Dispense:  360 tablet    Refill:  1    Orders Placed This Encounter  Procedures  . Hemoglobin A1c  . Urinalysis w microscopic + reflex cultur

## 2017-12-04 NOTE — Assessment & Plan Note (Signed)
Foot exam by MD today; check A1c and other labs; he cannot afford the Januvia

## 2017-12-04 NOTE — Assessment & Plan Note (Signed)
Encouraged weight loss 

## 2017-12-04 NOTE — Assessment & Plan Note (Signed)
Check lipids; try to limit saturated fats 

## 2017-12-04 NOTE — Assessment & Plan Note (Signed)
Check liver and kidneys 

## 2017-12-04 NOTE — Assessment & Plan Note (Signed)
Encouraged yearly eye exam.  

## 2017-12-05 ENCOUNTER — Other Ambulatory Visit: Payer: Self-pay | Admitting: Family Medicine

## 2017-12-05 LAB — URINALYSIS W MICROSCOPIC + REFLEX CULTURE
Bacteria, UA: NONE SEEN /HPF
Bilirubin Urine: NEGATIVE
Hyaline Cast: NONE SEEN /LPF
Leukocyte Esterase: NEGATIVE
Nitrites, Initial: NEGATIVE
Protein, ur: NEGATIVE
Specific Gravity, Urine: 1.042 — ABNORMAL HIGH (ref 1.001–1.03)
Squamous Epithelial / LPF: NONE SEEN /HPF (ref <=5)
WBC, UA: NONE SEEN /HPF (ref 0–5)
pH: 5.5 (ref 5.0–8.0)

## 2017-12-05 LAB — HEMOGLOBIN A1C
Hgb A1c MFr Bld: 8.7 % of total Hgb — ABNORMAL HIGH (ref <5.7)
Mean Plasma Glucose: 203 (calc)
eAG (mmol/L): 11.2 (calc)

## 2017-12-05 LAB — NO CULTURE INDICATED

## 2017-12-05 MED ORDER — PIOGLITAZONE HCL 45 MG PO TABS: 45.0000 mg | ORAL_TABLET | Freq: Every day | ORAL | 5 refills | Status: DC

## 2017-12-05 MED ORDER — EMPAGLIFLOZIN 25 MG PO TABS: 25.0000 mg | ORAL_TABLET | Freq: Every day | ORAL | 5 refills | Status: DC

## 2017-12-05 NOTE — Progress Notes (Signed)
Increase jardiance and actos; offered injectable or referral to endo

## 2017-12-31 LAB — HM DIABETES EYE EXAM

## 2018-06-12 ENCOUNTER — Ambulatory Visit: Payer: Self-pay

## 2018-06-12 NOTE — Telephone Encounter (Signed)
Pt c/o hematuria for the past year. Pt noted blood in urine 1-2 times per week. Pt stated that she passed a clot yesterday and the blood in urine has gotten heavier in the past week.  Pt c/o no pain or urinary symptoms or fever. Care advice given and pt given appointment for Friday morning.   Reason for Disposition . Blood in urine  (Exception: could be normal menstrual bleeding)  Answer Assessment - Initial Assessment Questions 1. COLOR of URINE: "Describe the color of the urine."  (e.g., tea-colored, pink, red, blood clots, bloody)     Red, blood clots  2. ONSET: "When did the bleeding start?"      Gotten heavy in the last week or so 3. EPISODES: "How many times has there been blood in the urine?" or "How many times today?"     At least once a day-one time today 4. PAIN with URINATION: "Is there any pain with passing your urine?" If so, ask: "How bad is the pain?"  (Scale 1-10; or mild, moderate, severe)    - MILD - complains slightly about urination hurting    - MODERATE - interferes with normal activities      - SEVERE - excruciating, unwilling or unable to urinate because of the pain      no 5. FEVER: "Do you have a fever?" If so, ask: "What is your temperature, how was it measured, and when did it start?"     no 6. ASSOCIATED SYMPTOMS: "Are you passing urine more frequently than usual?"     no 7. OTHER SYMPTOMS: "Do you have any other symptoms?" (e.g., back/flank pain, abdominal pain, vomiting)     no 8. PREGNANCY: "Is there any chance you are pregnant?" "When was your last menstrual period?"     n/a  Protocols used: URINE - BLOOD IN-A-AH

## 2018-06-14 ENCOUNTER — Encounter: Payer: Self-pay | Admitting: Family Medicine

## 2018-06-14 ENCOUNTER — Ambulatory Visit: Payer: BLUE CROSS/BLUE SHIELD | Admitting: Family Medicine

## 2018-06-14 ENCOUNTER — Telehealth: Payer: Self-pay | Admitting: Family Medicine

## 2018-06-14 VITALS — BP 128/76 | HR 97 | Temp 98.1°F | Ht 72.0 in | Wt 232.3 lb

## 2018-06-14 DIAGNOSIS — E6609 Other obesity due to excess calories: Secondary | ICD-10-CM

## 2018-06-14 DIAGNOSIS — K219 Gastro-esophageal reflux disease without esophagitis: Secondary | ICD-10-CM

## 2018-06-14 DIAGNOSIS — R319 Hematuria, unspecified: Secondary | ICD-10-CM

## 2018-06-14 DIAGNOSIS — E1169 Type 2 diabetes mellitus with other specified complication: Secondary | ICD-10-CM | POA: Diagnosis not present

## 2018-06-14 DIAGNOSIS — Z5181 Encounter for therapeutic drug level monitoring: Secondary | ICD-10-CM | POA: Diagnosis not present

## 2018-06-14 DIAGNOSIS — Z125 Encounter for screening for malignant neoplasm of prostate: Secondary | ICD-10-CM

## 2018-06-14 DIAGNOSIS — L409 Psoriasis, unspecified: Secondary | ICD-10-CM

## 2018-06-14 DIAGNOSIS — E669 Obesity, unspecified: Secondary | ICD-10-CM | POA: Insufficient documentation

## 2018-06-14 DIAGNOSIS — Z6831 Body mass index (BMI) 31.0-31.9, adult: Secondary | ICD-10-CM

## 2018-06-14 DIAGNOSIS — E785 Hyperlipidemia, unspecified: Secondary | ICD-10-CM | POA: Diagnosis not present

## 2018-06-14 LAB — POCT URINALYSIS DIPSTICK
Bilirubin, UA: NEGATIVE
Blood, UA: POSITIVE
Glucose, UA: POSITIVE — AB
Ketones, UA: NEGATIVE
Leukocytes, UA: NEGATIVE
Nitrite, UA: NEGATIVE
Protein, UA: NEGATIVE
Spec Grav, UA: 1.015 (ref 1.010–1.025)
Urobilinogen, UA: NEGATIVE E.U./dL — AB
pH, UA: 7 (ref 5.0–8.0)

## 2018-06-14 LAB — URINALYSIS, MICROSCOPIC ONLY
Bacteria, UA: NONE SEEN /HPF
Hyaline Cast: NONE SEEN /LPF
Squamous Epithelial / HPF: NONE SEEN /HPF (ref <=5)

## 2018-06-14 NOTE — Assessment & Plan Note (Signed)
Managed by Dr. Evorn Gong, derm

## 2018-06-14 NOTE — Assessment & Plan Note (Signed)
Checked feet today; eye exam UTD: check labs; encouraged healthy eating and weight; eye exams yearly or as directed by eye doctor

## 2018-06-14 NOTE — Assessment & Plan Note (Signed)
Controlled, no meds.  

## 2018-06-14 NOTE — Patient Instructions (Addendum)
Check out the information at familydoctor.org entitled "Nutrition for Weight Loss: What You Need to Know about Fad Diets" Try to lose between 1-2 pounds per week by taking in fewer calories and burning off more calories You can succeed by limiting portions, limiting foods dense in calories and fat, becoming more active, and drinking 8 glasses of water a day (64 ounces) Don't skip meals, especially breakfast, as skipping meals may alter your metabolism Do not use over-the-counter weight loss pills or gimmicks that claim rapid weight loss A healthy BMI (or body mass index) is between 18.5 and 24.9 You can calculate your ideal BMI at the NIH website http://www.nhlbi.nih.gov/health/educational/lose_wt/BMI/bmicalc.htm Try to limit saturated fats in your diet (bologna, hot dogs, barbeque, cheeseburgers, hamburgers, steak, bacon, sausage, cheese, etc.) and get more fresh fruits, vegetables, and whole grains  Obesity, Adult Obesity is the condition of having too much total body fat. Being overweight or obese means that your weight is greater than what is considered healthy for your body size. Obesity is determined by a measurement called BMI. BMI is an estimate of body fat and is calculated from height and weight. For adults, a BMI of 30 or higher is considered obese. Obesity can eventually lead to other health concerns and major illnesses, including:  Stroke.  Coronary artery disease (CAD).  Type 2 diabetes.  Some types of cancer, including cancers of the colon, breast, uterus, and gallbladder.  Osteoarthritis.  High blood pressure (hypertension).  High cholesterol.  Sleep apnea.  Gallbladder stones.  Infertility problems. What are the causes? The main cause of obesity is taking in (consuming) more calories than your body uses for energy. Other factors that contribute to this condition may include:  Being born with genes that make you more likely to become obese.  Having a medical  condition that causes obesity. These conditions include: ? Hypothyroidism. ? Polycystic ovarian syndrome (PCOS). ? Binge-eating disorder. ? Cushing syndrome.  Taking certain medicines, such as steroids, antidepressants, and seizure medicines.  Not being physically active (sedentary lifestyle).  Living where there are limited places to exercise safely or buy healthy foods.  Not getting enough sleep. What increases the risk? The following factors may increase your risk of this condition:  Having a family history of obesity.  Being a woman of African-American descent.  Being a man of Hispanic descent. What are the signs or symptoms? Having excessive body fat is the main symptom of this condition. How is this diagnosed? This condition may be diagnosed based on:  Your symptoms.  Your medical history.  A physical exam. Your health care provider may measure: ? Your BMI. If you are an adult with a BMI between 25 and less than 30, you are considered overweight. If you are an adult with a BMI of 30 or higher, you are considered obese. ? The distances around your hips and your waist (circumferences). These may be compared to each other to help diagnose your condition. ? Your skinfold thickness. Your health care provider may gently pinch a fold of your skin and measure it. How is this treated? Treatment for this condition often includes changing your lifestyle. Treatment may include some or all of the following:  Dietary changes. Work with your health care provider and a dietitian to set a weight-loss goal that is healthy and reasonable for you. Dietary changes may include eating: ? Smaller portions. A portion size is the amount of a particular food that is healthy for you to eat at one time. This   varies from person to person. ? Low-calorie or low-fat options. ? More whole grains, fruits, and vegetables.  Regular physical activity. This may include aerobic activity (cardio) and strength  training.  Medicine to help you lose weight. Your health care provider may prescribe medicine if you are unable to lose 1 pound a week after 6 weeks of eating more healthily and doing more physical activity.  Surgery. Surgical options may include gastric banding and gastric bypass. Surgery may be done if: ? Other treatments have not helped to improve your condition. ? You have a BMI of 40 or higher. ? You have life-threatening health problems related to obesity. Follow these instructions at home:  Eating and drinking   Follow recommendations from your health care provider about what you eat and drink. Your health care provider may advise you to: ? Limit fast foods, sweets, and processed snack foods. ? Choose low-fat options, such as low-fat milk instead of whole milk. ? Eat 5 or more servings of fruits or vegetables every day. ? Eat at home more often. This gives you more control over what you eat. ? Choose healthy foods when you eat out. ? Learn what a healthy portion size is. ? Keep low-fat snacks on hand. ? Avoid sugary drinks, such as soda, fruit juice, iced tea sweetened with sugar, and flavored milk. ? Eat a healthy breakfast.  Drink enough water to keep your urine clear or pale yellow.  Do not go without eating for long periods of time (do not fast) or follow a fad diet. Fasting and fad diets can be unhealthy and even dangerous. Physical Activity  Exercise regularly, as told by your health care provider. Ask your health care provider what types of exercise are safe for you and how often you should exercise.  Warm up and stretch before being active.  Cool down and stretch after being active.  Rest between periods of activity. Lifestyle  Limit the time that you spend in front of your TV, computer, or video game system.  Find ways to reward yourself that do not involve food.  Limit alcohol intake to no more than 1 drink a day for nonpregnant women and 2 drinks a day for  men. One drink equals 12 oz of beer, 5 oz of wine, or 1 oz of hard liquor. General instructions  Keep a weight loss journal to keep track of the food you eat and how much you exercise you get.  Take over-the-counter and prescription medicines only as told by your health care provider.  Take vitamins and supplements only as told by your health care provider.  Consider joining a support group. Your health care provider may be able to recommend a support group.  Keep all follow-up visits as told by your health care provider. This is important. Contact a health care provider if:  You are unable to meet your weight loss goal after 6 weeks of dietary and lifestyle changes. This information is not intended to replace advice given to you by your health care provider. Make sure you discuss any questions you have with your health care provider. Document Released: 06/08/2004 Document Revised: 10/04/2015 Document Reviewed: 02/17/2015 Elsevier Interactive Patient Education  2019 Elsevier Inc.  Preventing Unhealthy Weight Gain, Adult Staying at a healthy weight is important to your overall health. When fat builds up in your body, you may become overweight or obese. Being overweight or obese increases your risk of developing certain health problems, such as heart disease, diabetes, sleeping problems,   joint problems, and some types of cancer. Unhealthy weight gain is often the result of making unhealthy food choices or not getting enough exercise. You can make changes to your lifestyle to prevent obesity and stay as healthy as possible. What nutrition changes can be made?   Eat only as much as your body needs. To do this: ? Pay attention to signs that you are hungry or full. Stop eating as soon as you feel full. ? If you feel hungry, try drinking water first before eating. Drink enough water so your urine is clear or pale yellow. ? Eat smaller portions. Pay attention to portion sizes when eating  out. ? Look at serving sizes on food labels. Most foods contain more than one serving per container. ? Eat the recommended number of calories for your gender and activity level. For most active people, a daily total of 2,000 calories is appropriate. If you are trying to lose weight or are not very active, you may need to eat fewer calories. Talk with your health care provider or a diet and nutrition specialist (dietitian) about how many calories you need each day.  Choose healthy foods, such as: ? Fruits and vegetables. At each meal, try to fill at least half of your plate with fruits and vegetables. ? Whole grains, such as whole-wheat bread, brown rice, and quinoa. ? Lean meats, such as chicken or fish. ? Other healthy proteins, such as beans, eggs, or tofu. ? Healthy fats, such as nuts, seeds, fatty fish, and olive oil. ? Low-fat or fat-free dairy products.  Check food labels, and avoid food and drinks that: ? Are high in calories. ? Have added sugar. ? Are high in sodium. ? Have saturated fats or trans fats.  Cook foods in healthier ways, such as by baking, broiling, or grilling.  Make a meal plan for the week, and shop with a grocery list to help you stay on track with your purchases. Try to avoid going to the grocery store when you are hungry.  When grocery shopping, try to shop around the outside of the store first, where the fresh foods are. Doing this helps you to avoid prepackaged foods, which can be high in sugar, salt (sodium), and fat. What lifestyle changes can be made?   Exercise for 30 or more minutes on 5 or more days each week. Exercising may include brisk walking, yard work, biking, running, swimming, and team sports like basketball and soccer. Ask your health care provider which exercises are safe for you.  Do muscle-strengthening activities, such as lifting weights or using resistance bands, on 2 or more days a week.  Do not use any products that contain nicotine or  tobacco, such as cigarettes and e-cigarettes. If you need help quitting, ask your health care provider.  Limit alcohol intake to no more than 1 drink a day for nonpregnant women and 2 drinks a day for men. One drink equals 12 oz of beer, 5 oz of wine, or 1 oz of hard liquor.  Try to get 7-9 hours of sleep each night. What other changes can be made?  Keep a food and activity journal to keep track of: ? What you ate and how many calories you had. Remember to count the calories in sauces, dressings, and side dishes. ? Whether you were active, and what exercises you did. ? Your calorie, weight, and activity goals.  Check your weight regularly. Track any changes. If you notice you have gained weight, make changes   to your diet or activity routine.  Avoid taking weight-loss medicines or supplements. Talk to your health care provider before starting any new medicine or supplement.  Talk to your health care provider before trying any new diet or exercise plan. Why are these changes important? Eating healthy, staying active, and having healthy habits can help you to prevent obesity. Those changes also:  Help you manage stress and emotions.  Help you connect with friends and family.  Improve your self-esteem.  Improve your sleep.  Prevent long-term health problems. What can happen if changes are not made? Being obese or overweight can cause you to develop joint or bone problems, which can make it hard for you to stay active or do activities you enjoy. Being obese or overweight also puts stress on your heart and lungs and can lead to health problems like diabetes, heart disease, and some cancers. Where to find more information Talk with your health care provider or a dietitian about healthy eating and healthy lifestyle choices. You may also find information from:  U.S. Department of Agriculture, MyPlate: www.choosemyplate.gov  American Heart Association: www.heart.org  Centers for Disease  Control and Prevention: www.cdc.gov Summary  Staying at a healthy weight is important to your overall health. It helps you to prevent certain diseases and health problems, such as heart disease, diabetes, joint problems, sleep disorders, and some types of cancer.  Being obese or overweight can cause you to develop joint or bone problems, which can make it hard for you to stay active or do activities you enjoy.  You can prevent unhealthy weight gain by eating a healthy diet, exercising regularly, not smoking, limiting alcohol, and getting enough sleep.  Talk with your health care provider or a dietitian for guidance about healthy eating and healthy lifestyle choices. This information is not intended to replace advice given to you by your health care provider. Make sure you discuss any questions you have with your health care provider. Document Released: 05/02/2016 Document Revised: 02/09/2017 Document Reviewed: 06/07/2016 Elsevier Interactive Patient Education  2019 Elsevier Inc.  

## 2018-06-14 NOTE — Assessment & Plan Note (Signed)
Encouraged weight loss 

## 2018-06-14 NOTE — Progress Notes (Signed)
BP 128/76   Pulse 97   Temp 98.1 F (36.7 C)   Ht 6' (1.829 m)   Wt 232 lb 4.8 oz (105.4 kg)   SpO2 94%   BMI 31.51 kg/m    Subjective:    Patient ID: Matthew Woodard, male    DOB: [birth year], 53 y.o.   MRN: [id number]  HPI: Matthew Woodard is a 53 y.o. male  Chief Complaint  Patient presents with  . Hematuria    Has been going on and off for about a year    HPI  Here for f/u Has been noticing hematuria, going on for about a year; intermittent; he recalls that he had back pain for about a year ago, blood at that time in the urine, was set up to get checked out but insurance company said it was the most expensive and wanted him to go somewhere else, patient canceled that appointment; just has had intermittent bleeding since then; this week, he urinated blood in the shower and had a big clot; that has happened a few other times; getting more progressive over the last few months; no back pain now, no weight loss, no nights swets; he's been putting off getting it checked out and is ready to address  Type 2 diabetes; uncontrolled Eye exam UTD, Aug 2019 "it's terrible", sister-in-law is cooking treats Stress, work, Social research officer, government. Not checking FSBS No new problems with feet  Lab Results  Component Value Date   HGBA1C 8.7 (H) 12/04/2017   High choleterol, so-so diet  Psoriasis; on MTX, on lower dose now to stretch out the medicine, 4 pills a week now Lots of stress with the house they bought, doing remodeling  Obesity; stable; just eats too much sugar  Acid reflux; actually not too bad  Depression screen [facility] 2/9 06/14/2018 12/04/2017 07/25/2017 05/09/2017 01/08/2017  Decreased Interest 0 0 0 0 0  Down, Depressed, Hopeless 0 0 0 0 0  PHQ - 2 Score 0 0 0 0 0  Altered sleeping 0 - - - -  Tired, decreased energy 0 - - - -  Change in appetite 0 - - - -  Feeling bad or failure about yourself  0 - - - -  Trouble concentrating 0 - - - -  Moving slowly or fidgety/restless 0 - - - -    Suicidal thoughts 0 - - - -  PHQ-9 Score 0 - - - -  Difficult doing work/chores Not difficult at all - - - -   Fall Risk  06/14/2018 12/04/2017 07/25/2017 05/09/2017 01/08/2017  Falls in the past year? 0 No No No No  Number falls in past yr: - - - - -  Injury with Fall? - - - - -    Relevant past medical, surgical, family and social history reviewed Past Medical History:  Diagnosis Date  . Arthritis    hands, left knee  . Diabetes mellitus without complication ([place])   . Eczema 01/08/2017  . Hyperlipidemia LDL goal <100 11/02/2014  . Hypertension   . Kidney stone   . Wears dentures    full upper, partial lower.  Has, does not wear   Past Surgical History:  Procedure Laterality Date  . COLONOSCOPY WITH PROPOFOL N/A 02/23/2017   Procedure: COLONOSCOPY WITH PROPOFOL;  Surgeon: [name], MD;  Location: [place];  Service: Endoscopy;  Laterality: N/A;  Diabetic - oral meds  . CYST EXCISION  Aug. 2016   on back  .  KNEE SURGERY    . NASAL SINUS SURGERY  03/15  . POLYPECTOMY N/A 02/23/2017   Procedure: POLYPECTOMY;  Surgeon: [name], MD;  Location: [place];  Service: Endoscopy;  Laterality: N/A;  . tooth abstraction    . VARICOCELE EXCISION     Family History  Problem Relation Age of Onset  . Heart disease Father   . Hypertension Father   . COPD Father   . Colon cancer Maternal Grandmother   . Heart attack Paternal Grandfather    Social History   Tobacco Use  . Smoking status: Former Smoker    Types: Cigarettes    Last attempt to quit: 06/15/2013    Years since quitting: 5.0  . Smokeless tobacco: Never Used  Substance Use Topics  . Alcohol use: No    Alcohol/week: 0.0 standard drinks  . Drug use: No     Office Visit from 06/14/2018 in [facility]  AUDIT-C Score  0      Interim medical history since last visit reviewed. Allergies and medications reviewed  Review of Systems Per HPI unless specifically indicated  above     Objective:    BP 128/76   Pulse 97   Temp 98.1 F (36.7 C)   Ht 6' (1.829 m)   Wt 232 lb 4.8 oz (105.4 kg)   SpO2 94%   BMI 31.51 kg/m   Wt Readings from Last 3 Encounters:  06/14/18 232 lb 4.8 oz (105.4 kg)  12/04/17 231 lb 1.6 oz (104.8 kg)  07/25/17 232 lb 9.6 oz (105.5 kg)    Physical Exam Constitutional:      General: He is not in acute distress.    Appearance: He is well-developed.  HENT:     Head: Normocephalic and atraumatic.     Mouth/Throat:     Comments: edentulous Eyes:     General: No scleral icterus. Neck:     Thyroid: No thyromegaly.  Cardiovascular:     Rate and Rhythm: Normal rate and regular rhythm.  Pulmonary:     Effort: Pulmonary effort is normal.     Breath sounds: Normal breath sounds.  Abdominal:     General: Bowel sounds are normal. There is no distension.     Palpations: Abdomen is soft.     Tenderness: There is no abdominal tenderness.  Skin:    General: Skin is warm and dry.     Coloration: Skin is not pale.     Comments: Dry skin in the right palm  Neurological:     Coordination: Coordination normal.  Psychiatric:        Mood and Affect: Mood is not anxious or depressed.        Behavior: Behavior normal.        Thought Content: Thought content normal.        Judgment: Judgment normal.    Diabetic Foot Form - Detailed   Diabetic Foot Exam - detailed Diabetic Foot exam was performed with the following findings:  Yes 06/14/2018  9:46 AM  Visual Foot Exam completed.:  Yes  Pulse Foot Exam completed.:  Yes  Right Dorsalis Pedis:  Present Left Dorsalis Pedis:  Present  Sensory Foot Exam Completed.:  Yes Semmes-Weinstein Monofilament Test R Site 1-Great Toe:  Pos L Site 1-Great Toe:  Pos         Results for orders placed or performed in visit on 06/14/18  POCT urinalysis dipstick  Result Value Ref Range   Color, UA yellow  Clarity, UA clear    Glucose, UA Positive (A) Negative   Bilirubin, UA Negative     Ketones, UA Negative    Spec Grav, UA 1.015 1.010 - 1.025   Blood, UA Positive+++    pH, UA 7.0 5.0 - 8.0   Protein, UA Negative Negative   Urobilinogen, UA negative (A) 0.2 or 1.0 E.U./dL   Nitrite, UA Negative    Leukocytes, UA Negative Negative   Appearance Yellow    Odor None       Assessment & Plan:   Problem List Items Addressed This Visit      Digestive   Acid reflux    Controlled, no meds        Endocrine   Diabetes mellitus type 2 in obese (HCC)    Checked feet today; eye exam UTD: check labs; encouraged healthy eating and weight; eye exams yearly or as directed by eye doctor      Relevant Orders   Microalbumin / creatinine urine ratio   Lipid panel   Hemoglobin A1c     Musculoskeletal and Integument   Psoriasis (Chronic)    Managed by Dr. Evorn Gong, derm        Other   Obesity (Chronic)    Encouraged weight loss      Medication monitoring encounter    Check liver and kidneys      Relevant Orders   COMPLETE METABOLIC PANEL WITH GFR   Hyperlipidemia LDL goal <100 (Chronic)    Limit saturated fats; limit eggs and fatty pig meat if possible; continue statin       Other Visit Diagnoses    Hematuria, unspecified type    -  Primary   gross, intermittent; refer to urologist; order CT urogram; urine cytology   Relevant Orders   POCT urinalysis dipstick (Completed)   Ambulatory referral to Urology   CT HEMATURIA WORKUP   Cytology - non gyn   Urinalysis, microscopic only   Prostate cancer screening       Relevant Orders   PSA       Follow up plan: Return in about 3 months (around 09/12/2018).  An after-visit summary was printed and given to the patient at [place].  Please see the patient instructions which may contain other information and recommendations beyond what is mentioned above in the assessment and plan.  No orders of the defined types were placed in this encounter.   Orders Placed This Encounter  Procedures  . CT HEMATURIA WORKUP   . PSA  . Microalbumin / creatinine urine ratio  . Lipid panel  . Hemoglobin A1c  . COMPLETE METABOLIC PANEL WITH GFR  . Urinalysis, microscopic only  . Ambulatory referral to Urology  . POCT urinalysis dipstick

## 2018-06-14 NOTE — Telephone Encounter (Signed)
I reached patient No visible blood for two days STOP rosuvastatin, STOP metformin No real extra aches in the muscles Will get CK I explained Stay well-hydrated He already has the CT scan scheduled Urologist appt in 3 weeks Come pick up a few urine specimen containers to have on-hand and collect urine when he sees visible blood, then bring back here and we'll retest it (pending future lab order in the chart)

## 2018-06-14 NOTE — Telephone Encounter (Signed)
Curbside consult with nephrologist; suggested getting CK, have patient bring in urine specimen when visible hematuria for UA and micro I called patient, he is occupied so I offered to call back after 30+ minutes I called [facility], added on CK Will call patient in a little bit and STOP the rosuvastatin for now pending work-up Future order placed for urine dip with micro for when patient sees the visible blood again

## 2018-06-14 NOTE — Assessment & Plan Note (Signed)
Limit saturated fats; limit eggs and fatty pig meat if possible; continue statin

## 2018-06-14 NOTE — Assessment & Plan Note (Signed)
Check liver and kidneys 

## 2018-06-15 LAB — COMPLETE METABOLIC PANEL WITH GFR
AG Ratio: 2 (calc) (ref 1.0–2.5)
ALT: 17 U/L (ref 9–46)
AST: 16 U/L (ref 10–35)
Albumin: 4.2 g/dL (ref 3.6–5.1)
Alkaline phosphatase (APISO): 62 U/L (ref 40–115)
BUN: 20 mg/dL (ref 7–25)
CO2: 24 mmol/L (ref 20–32)
Calcium: 9.5 mg/dL (ref 8.6–10.3)
Chloride: 104 mmol/L (ref 98–110)
Creat: 0.87 mg/dL (ref 0.70–1.33)
GFR, Est African American: 114 mL/min/{1.73_m2} (ref >=60)
GFR, Est Non African American: 99 mL/min/{1.73_m2} (ref >=60)
Globulin: 2.1 g/dL (calc) (ref 1.9–3.7)
Glucose, Bld: 167 mg/dL — ABNORMAL HIGH (ref 65–99)
Potassium: 4.4 mmol/L (ref 3.5–5.3)
Sodium: 138 mmol/L (ref 135–146)
Total Bilirubin: 0.6 mg/dL (ref 0.2–1.2)
Total Protein: 6.3 g/dL (ref 6.1–8.1)

## 2018-06-15 LAB — LIPID PANEL
Cholesterol: 183 mg/dL (ref <200)
HDL: 62 mg/dL (ref >40)
LDL Cholesterol (Calc): 102 mg/dL (calc) — ABNORMAL HIGH
Non-HDL Cholesterol (Calc): 121 mg/dL (calc) (ref <130)
Total CHOL/HDL Ratio: 3 (calc) (ref <5.0)
Triglycerides: 94 mg/dL (ref <150)

## 2018-06-15 LAB — HEMOGLOBIN A1C
Hgb A1c MFr Bld: 9.2 % of total Hgb — ABNORMAL HIGH (ref <5.7)
Mean Plasma Glucose: 217 (calc)
eAG (mmol/L): 12 (calc)

## 2018-06-15 LAB — MICROALBUMIN / CREATININE URINE RATIO
Creatinine, Urine: 68 mg/dL (ref 20–320)
Microalb Creat Ratio: 28 mcg/mg creat (ref <30)
Microalb, Ur: 1.9 mg/dL

## 2018-06-15 LAB — PSA: PSA: 0.4 ng/mL (ref <=4.0)

## 2018-06-17 ENCOUNTER — Other Ambulatory Visit: Payer: Self-pay | Admitting: Family Medicine

## 2018-06-17 LAB — NON-GYN, SPECIMEN A

## 2018-06-17 LAB — CYTOLOGY - NON PAP

## 2018-06-17 LAB — CK: Total CK: 132 U/L (ref 44–196)

## 2018-06-17 MED ORDER — METFORMIN HCL ER 500 MG PO TB24: 2000.0000 mg | ORAL_TABLET | Freq: Every day | ORAL | 1 refills | Status: DC

## 2018-06-17 MED ORDER — ROSUVASTATIN CALCIUM 10 MG PO TABS: 10.0000 mg | ORAL_TABLET | Freq: Every day | ORAL | 0 refills | Status: DC

## 2018-06-17 NOTE — Progress Notes (Signed)
Normal CK Crestor 10 Resume metformin

## 2018-06-21 ENCOUNTER — Ambulatory Visit
Admission: RE | Admit: 2018-06-21 | Discharge: 2018-06-21 | Disposition: A | Discharge status: Home / Self Care | Payer: BLUE CROSS/BLUE SHIELD | Source: Ambulatory Visit | Attending: Family Medicine | Admitting: Family Medicine

## 2018-06-21 ENCOUNTER — Ambulatory Visit: Payer: BLUE CROSS/BLUE SHIELD | Admitting: Family Medicine

## 2018-06-21 ENCOUNTER — Telehealth: Payer: Self-pay | Admitting: Family Medicine

## 2018-06-21 ENCOUNTER — Other Ambulatory Visit
Admission: RE | Admit: 2018-06-21 | Discharge: 2018-06-21 | Disposition: A | Discharge status: Home / Self Care | Payer: BLUE CROSS/BLUE SHIELD | Source: Ambulatory Visit | Attending: Family Medicine | Admitting: Family Medicine

## 2018-06-21 ENCOUNTER — Encounter: Payer: Self-pay | Admitting: Family Medicine

## 2018-06-21 VITALS — BP 128/64 | HR 95 | Temp 98.2°F | Resp 12 | Ht 72.0 in | Wt 235.3 lb

## 2018-06-21 DIAGNOSIS — N2889 Other specified disorders of kidney and ureter: Secondary | ICD-10-CM

## 2018-06-21 DIAGNOSIS — I700 Atherosclerosis of aorta: Secondary | ICD-10-CM

## 2018-06-21 DIAGNOSIS — N400 Enlarged prostate without lower urinary tract symptoms: Secondary | ICD-10-CM | POA: Diagnosis not present

## 2018-06-21 DIAGNOSIS — R319 Hematuria, unspecified: Secondary | ICD-10-CM | POA: Insufficient documentation

## 2018-06-21 DIAGNOSIS — E669 Obesity, unspecified: Secondary | ICD-10-CM

## 2018-06-21 DIAGNOSIS — E1169 Type 2 diabetes mellitus with other specified complication: Secondary | ICD-10-CM | POA: Diagnosis not present

## 2018-06-21 DIAGNOSIS — N281 Cyst of kidney, acquired: Secondary | ICD-10-CM

## 2018-06-21 LAB — URINALYSIS, COMPLETE (UACMP) WITH MICROSCOPIC
Bacteria, UA: NONE SEEN
Bilirubin Urine: NEGATIVE
Glucose, UA: >=500 mg/dL — AB
Ketones, ur: NEGATIVE mg/dL
Leukocytes, UA: NEGATIVE
Nitrite: NEGATIVE
Protein, ur: NEGATIVE mg/dL
Specific Gravity, Urine: 1.032 — ABNORMAL HIGH (ref 1.005–1.030)
Squamous Epithelial / HPF: NONE SEEN (ref 0–5)
pH: 5 (ref 5.0–8.0)

## 2018-06-21 MED ORDER — IOPAMIDOL (ISOVUE-300) INJECTION 61%
125.0000 mL | Freq: Once | INTRAVENOUS | Status: AC | PRN
  Administered 2018-06-21: 125 mL via INTRAVENOUS

## 2018-06-21 MED ORDER — METFORMIN HCL ER 500 MG PO TB24: 1000.0000 mg | ORAL_TABLET | Freq: Every day | ORAL | Status: DC

## 2018-06-21 NOTE — Patient Instructions (Addendum)
Please go to the hospital for the chest xray Call me if I can be of any help I am here with you Start back on the metformin one pill a day for the first week, then two a day

## 2018-06-21 NOTE — Telephone Encounter (Signed)
Please call patient, ask him to come in today to go over test results

## 2018-06-21 NOTE — Telephone Encounter (Signed)
Pt notified will be here at 3:40 today

## 2018-06-21 NOTE — Telephone Encounter (Signed)
Matthew Woodard, PCA with [facility] Radiology called report of CT Hematuria Workup, impression 1-solid enhancing mass involving the inferrior pole of the right kidney. She says that's what she's calling to report. Result read back and verified resulted in Epic in patient's chart. I advised I will send to Dr. Sanda Klein. [name], FC called and advised.

## 2018-06-21 NOTE — Progress Notes (Signed)
BP 128/64   Pulse 95   Temp 98.2 F (36.8 C) (Oral)   Resp 12   Ht 6' (1.829 m)   Wt 235 lb 4.8 oz (106.7 kg)   SpO2 96%   BMI 31.91 kg/m    Subjective:    Patient ID: Matthew Woodard, male    DOB: Jun 07, 1965, 53 y.o.   MRN: [id number]  HPI: Matthew Woodard is a 53 y.o. male  Chief Complaint  Patient presents with  . Results    CT SCAN    HPI Patient is here to go over the results of his scan; he is accompanied by his wife Please see the CT scan which we reviewed together today line by line He has not current pain; the only pain he had was many months ago, though he had a kidney stone perhaps He has continued to see blood on and off for months Well water when living near [place], well water whole time he grew No night sweats and no unexplained weight loss No prostate issues  CT hematuria work-up Jun 21, 2018:  IMPRESSION: 1. Solid enhancing mass involving the inferior pole of right kidney measures 4.3 cm in maximum dimension. Findings consistent with renal cell carcinoma. There is extension into the inferior pole sinus with involvement of the lower pole calices of the right kidney. No evidence for renal vein invasion or abdominal adenopathy. No evidence for distant metastasis within the abdomen or pelvis. 2. Bosniak category 1 and 2 cysts noted within the upper pole of left kidney. 3.  Aortic Atherosclerosis (ICD10-I70.0). 4. Prostate gland enlargement. 5. Tiny punctate stone noted within the inferior pole collecting system of left kidney. 6. These results will be called to the ordering clinician or representative by the Radiologist Assistant, and communication documented in the PACS or zVision Dashboard.   Electronically Signed   By: Kerby Moors M.D.   On: 06/21/2018 10:10  Depression screen [facility] 2/9 06/14/2018 12/04/2017 07/25/2017 05/09/2017 01/08/2017  Decreased Interest 0 0 0 0 0  Down, Depressed, Hopeless 0 0 0 0 0  PHQ - 2 Score 0 0 0 0 0    Altered sleeping 0 - - - -  Tired, decreased energy 0 - - - -  Change in appetite 0 - - - -  Feeling bad or failure about yourself  0 - - - -  Trouble concentrating 0 - - - -  Moving slowly or fidgety/restless 0 - - - -  Suicidal thoughts 0 - - - -  PHQ-9 Score 0 - - - -  Difficult doing work/chores Not difficult at all - - - -   Fall Risk  06/14/2018 12/04/2017 07/25/2017 05/09/2017 01/08/2017  Falls in the past year? 0 No No No No  Number falls in past yr: - - - - -  Injury with Fall? - - - - -    Relevant past medical, surgical, family and social history reviewed Past Medical History:  Diagnosis Date  . Arthritis    hands, left knee  . Diabetes mellitus without complication ([place])   . Eczema 01/08/2017  . Hyperlipidemia LDL goal <100 11/02/2014  . Hypertension   . Kidney stone   . Wears dentures    full upper, partial lower.  Has, does not wear   Past Surgical History:  Procedure Laterality Date  . COLONOSCOPY WITH PROPOFOL N/A 02/23/2017   Procedure: COLONOSCOPY WITH PROPOFOL;  Surgeon: [name], MD;  Location: [place];  Service: Endoscopy;  Laterality: N/A;  Diabetic - oral meds  . CYST EXCISION  Aug. 2016   on back  . KNEE SURGERY    . NASAL SINUS SURGERY  03/15  . POLYPECTOMY N/A 02/23/2017   Procedure: POLYPECTOMY;  Surgeon: [name], MD;  Location: [place];  Service: Endoscopy;  Laterality: N/A;  . tooth abstraction    . VARICOCELE EXCISION     Family History  Problem Relation Age of Onset  . Heart disease Father   . Hypertension Father   . COPD Father   . Colon cancer Maternal Grandmother   . Heart attack Paternal Grandfather    Social History   Tobacco Use  . Smoking status: Former Smoker    Types: Cigarettes    Last attempt to quit: 06/15/2013    Years since quitting: 5.0  . Smokeless tobacco: Never Used  Substance Use Topics  . Alcohol use: No    Alcohol/week: 0.0 standard drinks  . Drug use: No     Office Visit from  06/14/2018 in [facility]  AUDIT-C Score  0      Interim medical history since last visit reviewed. Allergies and medications reviewed  Review of Systems Per HPI unless specifically indicated above     Objective:    BP 128/64   Pulse 95   Temp 98.2 F (36.8 C) (Oral)   Resp 12   Ht 6' (1.829 m)   Wt 235 lb 4.8 oz (106.7 kg)   SpO2 96%   BMI 31.91 kg/m   Wt Readings from Last 3 Encounters:  06/21/18 235 lb 4.8 oz (106.7 kg)  06/14/18 232 lb 4.8 oz (105.4 kg)  12/04/17 231 lb 1.6 oz (104.8 kg)    Physical Exam Constitutional:      General: He is not in acute distress.    Appearance: He is well-developed. He is obese.  Eyes:     General: No scleral icterus. Cardiovascular:     Rate and Rhythm: Normal rate and regular rhythm.  Pulmonary:     Effort: Pulmonary effort is normal.     Breath sounds: Normal breath sounds.  Abdominal:     General: Bowel sounds are normal. There is no abdominal bruit.     Palpations: Abdomen is soft. There is no mass.     Tenderness: There is no abdominal tenderness.  Skin:    Coloration: Skin is not pale.  Neurological:     Mental Status: He is alert.  Psychiatric:        Mood and Affect: Mood is not anxious.    Diabetic Foot Form - Detailed   Diabetic Foot Exam - detailed Diabetic Foot exam was performed with the following findings:  Yes 06/21/2018  6:10 PM  Visual Foot Exam completed.:  Yes  Pulse Foot Exam completed.:  Yes  Right Dorsalis Pedis:  Present Left Dorsalis Pedis:  Present  Sensory Foot Exam Completed.:  Yes Semmes-Weinstein Monofilament Test R Site 1-Great Toe:  Pos L Site 1-Great Toe:  Pos          Assessment & Plan:   Problem List Items Addressed This Visit      Cardiovascular and Mediastinum   Aortic atherosclerosis ([place])    Noted on CT scan, discussed with patient; goal LDL less than 70        Endocrine   Diabetes mellitus type 2 in obese ([place])    Foot exam by MD      Relevant  Medications   metFORMIN (GLUCOPHAGE-XR) 500 MG 24 hr tablet     Genitourinary   Renal cyst    Noted on CT scan; discussed with patient; referring to urologist        Other   Renal mass, right - Primary    CT results reviewed line by line with patient and his wife Discussed the concerning mass, other features as well (nodular prostate, 2 mm kidney stone, atherosclerosis); most urgent issue is obviously the mass; I communicated with urologist, who recommended CXR and urine cytology (ordered); he agrees to see the patient early next week Support offered for patient; contact me with questions arising in the meantime      Relevant Orders   DG Chest 2 View (Completed)   Cytology - non gyn   Enlarged prostate    Nodular enlarged prostate on CT scan; patient notified; he will be seeing urologist; last PSA reviewed          Follow up plan: No follow-ups on file.  An after-visit summary was printed and given to the patient at [place].  Please see the patient instructions which may contain other information and recommendations beyond what is mentioned above in the assessment and plan.  Meds ordered this encounter  Medications  . metFORMIN (GLUCOPHAGE-XR) 500 MG 24 hr tablet    Sig: Take 2 tablets (1,000 mg total) by mouth daily with breakfast.    Orders Placed This Encounter  Procedures  . DG Chest 2 View

## 2018-06-22 LAB — URINE CULTURE: Culture: NO GROWTH

## 2018-06-23 DIAGNOSIS — N281 Cyst of kidney, acquired: Secondary | ICD-10-CM | POA: Insufficient documentation

## 2018-06-23 DIAGNOSIS — N2889 Other specified disorders of kidney and ureter: Secondary | ICD-10-CM | POA: Insufficient documentation

## 2018-06-23 DIAGNOSIS — N400 Enlarged prostate without lower urinary tract symptoms: Secondary | ICD-10-CM | POA: Insufficient documentation

## 2018-06-23 DIAGNOSIS — I700 Atherosclerosis of aorta: Secondary | ICD-10-CM | POA: Insufficient documentation

## 2018-06-23 NOTE — Assessment & Plan Note (Signed)
Noted on CT scan; discussed with patient; referring to urologist

## 2018-06-23 NOTE — Assessment & Plan Note (Signed)
Foot exam by MD

## 2018-06-23 NOTE — Assessment & Plan Note (Signed)
Noted on CT scan, discussed with patient; goal LDL less than 70

## 2018-06-23 NOTE — Assessment & Plan Note (Signed)
CT results reviewed line by line with patient and his wife Discussed the concerning mass, other features as well (nodular prostate, 2 mm kidney stone, atherosclerosis); most urgent issue is obviously the mass; I communicated with urologist, who recommended CXR and urine cytology (ordered); he agrees to see the patient early next week Support offered for patient; contact me with questions arising in the meantime

## 2018-06-23 NOTE — Assessment & Plan Note (Signed)
Nodular enlarged prostate on CT scan; patient notified; he will be seeing urologist; last PSA reviewed

## 2018-06-26 ENCOUNTER — Ambulatory Visit: Payer: BLUE CROSS/BLUE SHIELD | Admitting: Urology

## 2018-06-26 ENCOUNTER — Other Ambulatory Visit: Payer: Self-pay | Admitting: Radiology

## 2018-06-26 ENCOUNTER — Other Ambulatory Visit: Payer: Self-pay

## 2018-06-26 ENCOUNTER — Telehealth: Payer: Self-pay | Admitting: Radiology

## 2018-06-26 ENCOUNTER — Encounter
Admission: RE | Admit: 2018-06-26 | Discharge: 2018-06-26 | Disposition: A | Discharge status: Home / Self Care | Payer: BLUE CROSS/BLUE SHIELD | Source: Ambulatory Visit | Attending: Urology | Admitting: Urology

## 2018-06-26 VITALS — BP 145/83 | HR 74 | Ht 72.0 in | Wt 230.0 lb

## 2018-06-26 DIAGNOSIS — Z01818 Encounter for other preprocedural examination: Secondary | ICD-10-CM | POA: Insufficient documentation

## 2018-06-26 DIAGNOSIS — N2889 Other specified disorders of kidney and ureter: Secondary | ICD-10-CM | POA: Diagnosis not present

## 2018-06-26 DIAGNOSIS — R310 Gross hematuria: Secondary | ICD-10-CM

## 2018-06-26 DIAGNOSIS — E119 Type 2 diabetes mellitus without complications: Secondary | ICD-10-CM | POA: Insufficient documentation

## 2018-06-26 HISTORY — DX: Psoriasis, unspecified: L40.9

## 2018-06-26 HISTORY — DX: Gastro-esophageal reflux disease without esophagitis: K21.9

## 2018-06-26 LAB — URINALYSIS, COMPLETE
Bilirubin, UA: NEGATIVE
Ketones, UA: NEGATIVE
Leukocytes, UA: NEGATIVE
Nitrite, UA: NEGATIVE
Protein, UA: NEGATIVE
Specific Gravity, UA: 1.02 (ref 1.005–1.030)
Urobilinogen, Ur: 0.2 mg/dL (ref 0.2–1.0)
pH, UA: 5.5 (ref 5.0–7.5)

## 2018-06-26 LAB — CBC
HCT: 42.7 % (ref 39.0–52.0)
Hemoglobin: 13.9 g/dL (ref 13.0–17.0)
MCH: 30.7 pg (ref 26.0–34.0)
MCHC: 32.6 g/dL (ref 30.0–36.0)
MCV: 94.3 fL (ref 80.0–100.0)
Platelets: 224 10*3/uL (ref 150–400)
RBC: 4.53 MIL/uL (ref 4.22–5.81)
RDW: 12.7 % (ref 11.5–15.5)
WBC: 6.5 10*3/uL (ref 4.0–10.5)
nRBC: 0 % (ref 0.0–0.2)

## 2018-06-26 LAB — MICROSCOPIC EXAMINATION
Bacteria, UA: NONE SEEN
Epithelial Cells (non renal): NONE SEEN /hpf (ref 0–10)

## 2018-06-26 NOTE — Progress Notes (Signed)
06/26/2018 8:45 AM   Durene Fruits III 1966/01/05 [id number]  Referring provider: Arnetha Courser, MD [address] [place], [place] [address]  CC: Right renal mass  HPI: I saw Mr. Zenia Resides in urology clinic today in consultation for a right renal mass from Dr. Sanda Klein.  He is a 53 year old male with past medical history notable for diabetes, hypertension, and psoriasis who is noted intermittent gross hematuria for 1 year.  This initially started 1 year ago when he had gross hematuria with small clots and right-sided flank pain.  His pain resolved, he attributed his pain to a kidney stone, and he did not seek medical care at that time.  He has had intermittent hematuria since that time, and ultimately presented to his primary care physician who ordered a CT urogram on 06/21/2018.  This showed a right 4 cm central enhancing renal mass with invasion into the collecting system on delayed films.  There is no hydronephrosis.  Contralateral kidney appears normal without any enhancing lesions.  No evidence of metastatic disease.  There are no prior films to compare.  He had a chest x-ray performed to complete staging, which showed no concerning findings.  He also had a urine cytology performed last week which was negative.  There are no aggravating or alleviating factors.  Severity is moderate.  He denies weight loss, bone pain, or flank pain.  He denies any family history of bladder or kidney cancer.  He has a 60-pack-year smoking history and quit 5 years ago.  He works as a Interior and spatial designer, and denies any carcinogenic exposures.  Past surgical history is notable for a left varicocele repair as a child.  No prior abdominal surgeries.  Renal function is normal with creatinine of 0.87, eGFR 100.   PMH: Past Medical History:  Diagnosis Date  . Arthritis    hands, left knee  . Diabetes mellitus without complication ([place])   . Eczema 01/08/2017  . Hyperlipidemia LDL goal <100 11/02/2014  .  Hypertension   . Kidney stone   . Wears dentures    full upper, partial lower.  Has, does not wear    Surgical History: Past Surgical History:  Procedure Laterality Date  . COLONOSCOPY WITH PROPOFOL N/A 02/23/2017   Procedure: COLONOSCOPY WITH PROPOFOL;  Surgeon: [name], MD;  Location: [place];  Service: Endoscopy;  Laterality: N/A;  Diabetic - oral meds  . CYST EXCISION  Aug. 2016   on back  . KNEE SURGERY    . NASAL SINUS SURGERY  03/15  . POLYPECTOMY N/A 02/23/2017   Procedure: POLYPECTOMY;  Surgeon: [name], MD;  Location: [place];  Service: Endoscopy;  Laterality: N/A;  . tooth abstraction    . VARICOCELE EXCISION      Allergies:  Allergies  Allergen Reactions  . Glucotrol [Glipizide] Other (See Comments)    Glucose level spiked then dropped    Family History: Family History  Problem Relation Age of Onset  . Heart disease Father   . Hypertension Father   . COPD Father   . Colon cancer Maternal Grandmother   . Heart attack Paternal Grandfather     Social History:  reports that he quit smoking about 5 years ago. His smoking use included cigarettes. He has never used smokeless tobacco. He reports that he does not drink alcohol or use drugs.  ROS: Please see flowsheet from today's date for complete review of systems.  Physical Exam: BP (!) 145/83  Pulse 74   Ht 6' (1.829 m)   Wt 230 lb (104.3 kg)   BMI 31.19 kg/m    Constitutional:  Alert and oriented, No acute distress. Cardiovascular: Regular rate and rhythm Respiratory: Clear to auscultation bilaterally GI: Abdomen is soft, nontender, nondistended, no abdominal masses GU: No CVA tenderness Lymph: No cervical or inguinal lymphadenopathy. Skin: No rashes, bruises or suspicious lesions. Neurologic: Grossly intact, no focal deficits, moving all 4 extremities. Psychiatric: Normal mood and affect.  Laboratory Data: Creatinine 0.7, EGFR 100  Urinalysis today 0-5 WBCs,  3-10 RBCs, no bacteria, nitrite negative  Pertinent Imaging: I have personally reviewed the chest x-ray and CT urogram.  4 cm right central enhancing renal mass invading the collecting system with filling defects on delayed film.  No evidence of metastatic disease.  No enhancing mass in contralateral kidney.  Assessment & Plan:   In summary, the patient is a relatively healthy 53 year old male with normal renal function in 1 year of intermittent gross hematuria.  CT urogram 06/21/2018 demonstrates a 4 cm central right enhancing renal mass with invasion into the collecting system and filling defects on delayed films.  This is concerning for possible renal cell carcinoma versus upper tract urothelial cell carcinoma.  There is no evidence of metastatic disease, and contralateral kidney is normal.  We had a long conversation about his imaging findings today and we reviewed the films together.  I discussed the differences in management between [place] and urothelial cell carcinoma, specifically that we would typically pursue neoadjuvant chemotherapy and nephro ureterectomy if he was found to have urothelial cell carcinoma versus a laparoscopic nephrectomy if he was found to have RCC.  We discussed that prognosis is based on tumor grade and stage.  I recommended ureteroscopy with biopsy to differentiate between the 2 prior to pursuing definitive management.  -Schedule right ureteroscopy, biopsy, ureteral stent placement.  We discussed the risks and benefits at length -Will ultimately need either right hand-assisted laparoscopic nephrectomy if found to have [place], versus neoadjuvant chemotherapy and likely robotic nephroureterectomy if upper tract urothelial cell Arvin, MD  [facility] [address] [place], [place] [address] [phone]

## 2018-06-26 NOTE — Telephone Encounter (Signed)
Patient was given the [facility] Information form below as well as the Instructions for Pre-Admission Testing form & a map of [facility].   [place], [place], [place] [address] Telephone: [phone] Fax: [phone]   Thank you for choosing [facility] for your upcoming surgery!  We are always here to assist in your urological needs.  Please read the following information with specific details for your upcoming appointments related to your surgery. Please contact Andres Vest at [phone] Option 3 with any questions.  The Name of Your Surgery: right ureteroscopy, biopsy, ureteral stent placement Your Surgery Date: 06/28/2018 Your Surgeon: Nickolas Madrid  Please call Same Day Surgery at [phone] between the hours of 1pm-3pm one day prior to your surgery. They will inform you of the time to arrive at Same Day Surgery which is located on the second floor of the [facility].   Please refer to the attached letter regarding instructions for Pre-Admission Testing. You will receive a call from the [place] office regarding your appointment with them.  The Pre-Admission Testing office is located at [place], on the first floor of the [facility] at [facility] in [place] (office is to the right as you enter through the [employer] of the [facility]). Please have all medications you are currently taking and your insurance card available.   Patient was advised to have nothing to eat or drink after midnight the night prior to surgery except that he may have only water until 2 hours before surgery with nothing to drink within 2 hours of surgery.  The patient states he currently takes no blood thinners.  Patient's questions were answered and he expressed understanding of these instructions.

## 2018-06-26 NOTE — Patient Instructions (Signed)
Your procedure is scheduled on:06/28/18 Report to Day Surgery.[facility] SECOND FLOOR To find out your arrival time please call [phone] between 1PM - 3PM on 06/27/18.  Remember: Instructions that are not followed completely may result in serious medical risk,  up to and including death, or upon the discretion of your surgeon and anesthesiologist your  surgery may need to be rescheduled.     _X__ 1. Do not eat food after midnight the night before your procedure.                 No gum chewing or hard candies. You may drink clear liquids up to 2 hours                 before you are scheduled to arrive for your surgery- DO not drink clear                 liquids within 2 hours of the start of your surgery.                 Clear Liquids include:  water, apple juice without pulp, clear carbohydrate                 drink such as Clearfast of Gatorade, Black Coffee or Tea (Do not add                 anything to coffee or tea).  __X__2.  On the morning of surgery brush your teeth with toothpaste and water, you                may rinse your mouth with mouthwash if you wish.  Do not swallow any toothpaste of mouthwash.     _X__ 3.  No Alcohol for 24 hours before or after surgery.   _X__ 4.  Do Not Smoke or use e-cigarettes For 24 Hours Prior to Your Surgery.                 Do not use any chewable tobacco products for at least 6 hours prior to                 surgery.  ____  5.  Bring all medications with you on the day of surgery if instructed.   _X___  6.  Notify your doctor if there is any change in your medical condition      (cold, fever, infections).     Do not wear jewelry, make-up, hairpins, clips or nail polish. Do not wear lotions, powders, or perfumes. You may wear deodorant. Do not shave 48 hours prior to surgery. Men may shave face and neck. Do not bring valuables to the hospital.    [facility] is not responsible for any belongings or  valuables.  Contacts, dentures or bridgework may not be worn into surgery. Leave your suitcase in the car. After surgery it may be brought to your room. For patients admitted to the hospital, discharge time is determined by your treatment team.   Patients discharged the day of surgery will not be allowed to drive home.      __X__ Take these medicines the morning of surgery with A SIP OF WATER:    1. PRILOSEC AT BEDTIME 06/27/18 AND MORNING OF SURGERY  2.   3.   4.  5.  6.  ____ Fleet Enema (as directed)   ____ Use CHG Soap as directed  ____ Use inhalers on the day of surgery  _X___ Stop  metformin 2 days prior to surgery NO MORE UNTIL AFTER SURGERY   ____ Take 1/2 of usual insulin dose the night before surgery. No insulin the morning          of surgery.   ____ Stop Coumadin/Plavix/aspirin on   __X__ Stop Anti-inflammatories on  MELOXICAM  NO MORE UNTIL AFTER SURGERY   __X__ Stop supplements until after surgery. GLUCOSAMIN  / FISH OIL NO MORE UNTIL AFTER SURGERY  ____ Bring C-Pap to the hospital.

## 2018-06-26 NOTE — Patient Instructions (Signed)
Ureteroscopy Ureteroscopy is a procedure to check for and treat problems inside part of the urinary tract. In this procedure, a thin, tube-shaped instrument with a light at the end (ureteroscope) is used to look at the inside of the kidneys and the ureters, which are the tubes that carry urine from the kidneys to the bladder. The ureteroscope is inserted into one or both of the ureters. You may need this procedure if you have frequent urinary tract infections (UTIs), blood in your urine, or a stone in one of your ureters. A ureteroscopy can be done to find the cause of urine blockage in a ureter and to evaluate other abnormalities inside the ureters or kidneys. If stones are found, they can be removed during the procedure. Polyps, abnormal tissue, and some types of tumors can also be removed or treated. The ureteroscope may also have a tool to remove tissue to be checked for disease under a microscope (biopsy). Tell a health care provider about:  Any allergies you have.  All medicines you are taking, including vitamins, herbs, eye drops, creams, and over-the-counter medicines.  Any problems you or family members have had with anesthetic medicines.  Any blood disorders you have.  Any surgeries you have had.  Any medical conditions you have.  Whether you are pregnant or may be pregnant. What are the risks? Generally, this is a safe procedure. However, problems may occur, including:  Bleeding.  Infection.  Allergic reactions to medicines.  Scarring that narrows the ureter (stricture).  Creating a hole in the ureter (perforation). What happens before the procedure? Staying hydrated Follow instructions from your health care provider about hydration, which may include:  Up to 2 hours before the procedure - you may continue to drink clear liquids, such as water, clear fruit juice, black coffee, and plain tea. Eating and drinking restrictions Follow instructions from your health care  provider about eating and drinking, which may include:  8 hours before the procedure - stop eating heavy meals or foods such as meat, fried foods, or fatty foods.  6 hours before the procedure - stop eating light meals or foods, such as toast or cereal.  6 hours before the procedure - stop drinking milk or drinks that contain milk.  2 hours before the procedure - stop drinking clear liquids. Medicines  Ask your health care provider about: ? Changing or stopping your regular medicines. This is especially important if you are taking diabetes medicines or blood thinners. ? Taking medicines such as aspirin and ibuprofen. These medicines can thin your blood. Do not take these medicines before your procedure if your health care provider instructs you not to.  You may be given antibiotic medicine to help prevent infection. General instructions  You may have a urine sample taken to check for infection.  Plan to have someone take you home from the hospital or clinic. What happens during the procedure?   To reduce your risk of infection: ? Your health care team will wash or sanitize their hands. ? Your skin will be washed with soap.  An IV tube will be inserted into one of your veins.  You will be given one of the following: ? A medicine to help you relax (sedative). ? A medicine to make you fall asleep (general anesthetic). ? A medicine that is injected into your spine to numb the area below and slightly above the injection site (spinal anesthetic).  To lower your risk of infection, you may be given an antibiotic medicine   by an injection or through the IV tube.  The opening from which you urinate (urethra) will be cleaned with a germ-killing solution.  The ureteroscope will be passed through your urethra into your bladder.  A salt-water solution will flow through the ureteroscope to fill your bladder. This will help the health care provider see the openings of your ureters more  clearly.  Then, the ureteroscope will be passed into your ureter. ? If a growth is found, a piece of it may be removed so it can be examined under a microscope (biopsy). ? If a stone is found, it may be removed through the ureteroscope, or the stone may be broken up using a laser, shock waves, or electrical energy. ? In some cases, if the ureter is too small, a tube may be inserted that keeps the ureter open (ureteral stent). The stent may be left in place for 1 or 2 weeks to keep the ureter open, and then the ureteroscopy procedure will be performed.  The scope will be removed, and your bladder will be emptied. The procedure may vary among health care providers and hospitals. What happens after the procedure?  Your blood pressure, heart rate, breathing rate, and blood oxygen level will be monitored until the medicines you were given have worn off.  You may be asked to urinate.  Donot drive for 24 hours if you were given a sedative. This information is not intended to replace advice given to you by your health care provider. Make sure you discuss any questions you have with your health care provider. Document Released: 05/06/2013 Document Revised: 02/15/2016 Document Reviewed: 02/11/2016 Elsevier Interactive Patient Education  2019 [employer].    Ureteral Stent Implantation  Ureteral stent implantation is a procedure to insert (implant) a flexible, soft, plastic tube (stent) into a tube (ureter) that drains urine from the kidneys. The stent supports the ureter while it heals and helps to drain urine from the kidneys. You may have a ureteral stent implanted after having a procedure to remove a blockage from the ureter (ureterolysis or pyeloplasty).You may also have a stent implanted to open the flow of urine when you have a blockage caused by a kidney stone, tumor, blood clot, or infection. You have two ureters, one on each side of the body. The ureters connect the kidneys to the organ  that holds urine until it passes out of the body (bladder). The stent is placed so that one end is in the kidney, and one end is in the bladder. The stent is usually taken out after your ureter has healed. Depending on your condition, you may have a stent for just a few weeks, or you may have a long-term stent that will need to be replaced every few months. Tell a health care provider about: Any allergies you have. All medicines you are taking, including vitamins, herbs, eye drops, creams, and over-the-counter medicines. Any problems you or family members have had with anesthetic medicines. Any blood disorders you have. Any surgeries you have had. Any medical conditions you have. Whether you are pregnant or may be pregnant. What are the risks? Generally, this is a safe procedure. However, problems may occur, including: Infection. Bleeding. Allergic reactions to medicines. Damage to other structures or organs. Tearing (perforation) of the ureter is possible. Movement of the stent away from where it is placed during surgery (migration). What happens before the procedure? Ask your health care provider about: Changing or stopping your regular medicines. This is especially important  if you are taking diabetes medicines or blood thinners. Taking medicines such as aspirin and ibuprofen. These medicines can thin your blood. Do not take these medicines before your procedure if your health care provider instructs you not to. Follow instructions from your health care provider about eating or drinking restrictions. Do not drink alcohol and do not use any tobacco products before your procedure, as told by your health care provider. You may be given antibiotic medicine to help prevent infection. Plan to have someone take you home after the procedure. If you go home right after the procedure, plan to have someone with you for 24 hours. What happens during the procedure? An IV tube will be inserted into  one of your veins. You will be given a medicine to make you fall asleep (general anesthetic). You may also be given a medicine to help you relax (sedative). A thin, tube-shaped instrument with a light and tiny camera at the end (cystoscope) will be inserted into your urethra. The urethra is the tube that drains urine from the bladder out of the body. In men, the urethra opens at the end of the penis. In women, the urethra opens in front of the vaginal opening. The cystoscope will be passed into your bladder. A thin wire (guide wire) will be passed through your bladder and into your ureter. This is used to guide the stent into your ureter. The stent will be inserted into your ureter. The guide wire and the cystoscope will be removed. A flexible tube (catheter) will be inserted through your urethra so that one end is in your bladder. This helps to drain urine from your bladder. The procedure may vary among hospitals and health care providers. What happens after the procedure? Your blood pressure, heart rate, breathing rate, and blood oxygen level will be monitored often until the medicines you were given have worn off. You may continue to receive medicine and fluids through an IV tube. You may have some soreness or pain in your abdomen and urethra. Medicines will be available to help you. You will be encouraged to get up and walk around as soon as you can. You will have a catheter draining your urine. You will have some blood in your urine. Do not drive for 24 hours if you received a sedative. This information is not intended to replace advice given to you by your health care provider. Make sure you discuss any questions you have with your health care provider. Document Released: 04/28/2000 Document Revised: 10/07/2015 Document Reviewed: 11/13/2014 Elsevier Interactive Patient Education  2019 [employer]  Ureteroscopy, Care After This sheet gives you information about how to care for yourself  after your procedure. Your health care provider may also give you more specific instructions. If you have problems or questions, contact your health care provider. What can I expect after the procedure? After the procedure, it is common to have:  A burning sensation when you urinate.  Blood in your urine.  Mild discomfort in the bladder area or kidney area when urinating.  Needing to urinate more often or urgently. Follow these instructions at home:  Medicines  Take over-the-counter and prescription medicines only as told by your health care provider.  If you were prescribed an antibiotic medicine, take it as told by your health care provider. Do not stop taking the antibiotic even if you start to feel better. General instructions  Donot drive for 24 hours if you were given a medicine to help you relax (sedative)  during your procedure.  To relieve burning, try taking a warm bath or holding a warm washcloth over your groin.  Drink enough fluid to keep your urine clear or pale yellow. ? Drink two 8-ounce glasses of water every hour for the first 2 hours after you get home. ? Continue to drink water often at home.  You can eat what you usually do.  Keep all follow-up visits as told by your health care provider. This is important. ? If you had a tube placed to keep urine flowing (ureteral stent), ask your health care provider when you need to return to have it removed. Contact a health care provider if:  You have chills or a fever.  You have burning pain for longer than 24 hours after the procedure.  You have blood in your urine for longer than 24 hours after the procedure. Get help right away if:  You have large amounts of blood in your urine.  You have blood clots in your urine.  You have very bad pain.  You have chest pain or trouble breathing.  You are unable to urinate and you have the feeling of a full bladder. This information is not intended to replace advice  given to you by your health care provider. Make sure you discuss any questions you have with your health care provider. Document Released: 05/06/2013 Document Revised: 02/15/2016 Document Reviewed: 02/11/2016 Elsevier Interactive Patient Education  2019 [employer].

## 2018-06-28 ENCOUNTER — Ambulatory Visit
Admission: RE | Admit: 2018-06-28 | Discharge: 2018-06-28 | Disposition: A | Discharge status: Home / Self Care | Payer: BLUE CROSS/BLUE SHIELD | Attending: Urology | Admitting: Urology

## 2018-06-28 ENCOUNTER — Ambulatory Visit: Payer: BLUE CROSS/BLUE SHIELD

## 2018-06-28 ENCOUNTER — Encounter
Admission: RE | Disposition: A | Discharge status: Home / Self Care | Payer: Self-pay | Source: Home / Self Care | Attending: Urology

## 2018-06-28 ENCOUNTER — Other Ambulatory Visit: Payer: Self-pay

## 2018-06-28 DIAGNOSIS — Z79899 Other long term (current) drug therapy: Secondary | ICD-10-CM | POA: Insufficient documentation

## 2018-06-28 DIAGNOSIS — E119 Type 2 diabetes mellitus without complications: Secondary | ICD-10-CM | POA: Insufficient documentation

## 2018-06-28 DIAGNOSIS — E785 Hyperlipidemia, unspecified: Secondary | ICD-10-CM | POA: Diagnosis not present

## 2018-06-28 DIAGNOSIS — N2889 Other specified disorders of kidney and ureter: Secondary | ICD-10-CM

## 2018-06-28 DIAGNOSIS — Z87891 Personal history of nicotine dependence: Secondary | ICD-10-CM | POA: Diagnosis not present

## 2018-06-28 DIAGNOSIS — R82998 Other abnormal findings in urine: Secondary | ICD-10-CM | POA: Diagnosis not present

## 2018-06-28 DIAGNOSIS — M19041 Primary osteoarthritis, right hand: Secondary | ICD-10-CM | POA: Insufficient documentation

## 2018-06-28 DIAGNOSIS — N3289 Other specified disorders of bladder: Secondary | ICD-10-CM | POA: Diagnosis not present

## 2018-06-28 DIAGNOSIS — M19042 Primary osteoarthritis, left hand: Secondary | ICD-10-CM | POA: Insufficient documentation

## 2018-06-28 DIAGNOSIS — Z7984 Long term (current) use of oral hypoglycemic drugs: Secondary | ICD-10-CM | POA: Diagnosis not present

## 2018-06-28 DIAGNOSIS — M1712 Unilateral primary osteoarthritis, left knee: Secondary | ICD-10-CM | POA: Insufficient documentation

## 2018-06-28 DIAGNOSIS — I10 Essential (primary) hypertension: Secondary | ICD-10-CM | POA: Insufficient documentation

## 2018-06-28 DIAGNOSIS — D4111 Neoplasm of uncertain behavior of right renal pelvis: Secondary | ICD-10-CM | POA: Diagnosis not present

## 2018-06-28 DIAGNOSIS — K219 Gastro-esophageal reflux disease without esophagitis: Secondary | ICD-10-CM | POA: Diagnosis not present

## 2018-06-28 HISTORY — PX: CYSTOSCOPY WITH BIOPSY: SHX5122

## 2018-06-28 HISTORY — PX: FULGURATION OF BLADDER TUMOR: SHX6261

## 2018-06-28 HISTORY — PX: CYSTOSCOPY WITH URETEROSCOPY AND STENT PLACEMENT: SHX6377

## 2018-06-28 HISTORY — PX: CYSTOSCOPY W/ RETROGRADES: SHX1426

## 2018-06-28 LAB — GLUCOSE, CAPILLARY
Glucose-Capillary: 134 mg/dL — ABNORMAL HIGH (ref 70–99)
Glucose-Capillary: 141 mg/dL — ABNORMAL HIGH (ref 70–99)

## 2018-06-28 SURGERY — CYSTOURETEROSCOPY, WITH STENT INSERTION
Anesthesia: General | Laterality: Right

## 2018-06-28 MED ORDER — FENTANYL CITRATE (PF) 100 MCG/2ML IJ SOLN
INTRAMUSCULAR | Status: DC | PRN
  Administered 2018-06-28 (×2): 50 ug via INTRAVENOUS

## 2018-06-28 MED ORDER — MIDAZOLAM HCL 2 MG/2ML IJ SOLN
INTRAMUSCULAR | Status: AC
  Filled 2018-06-28: qty 2

## 2018-06-28 MED ORDER — FENTANYL CITRATE (PF) 100 MCG/2ML IJ SOLN
INTRAMUSCULAR | Status: AC
  Administered 2018-06-28: 25 ug via INTRAVENOUS
  Filled 2018-06-28: qty 2

## 2018-06-28 MED ORDER — PROMETHAZINE HCL 25 MG/ML IJ SOLN: 6.2500 mg | INTRAMUSCULAR | Status: DC | PRN

## 2018-06-28 MED ORDER — ACETAMINOPHEN 160 MG/5ML PO SOLN: 325.0000 mg | ORAL | Status: DC | PRN

## 2018-06-28 MED ORDER — FENTANYL CITRATE (PF) 100 MCG/2ML IJ SOLN
25.0000 ug | INTRAMUSCULAR | Status: DC | PRN
  Administered 2018-06-28: 25 ug via INTRAVENOUS

## 2018-06-28 MED ORDER — TAMSULOSIN HCL 0.4 MG PO CAPS: 0.4000 mg | ORAL_CAPSULE | Freq: Every day | ORAL | 0 refills | Status: DC

## 2018-06-28 MED ORDER — ROCURONIUM BROMIDE 100 MG/10ML IV SOLN
INTRAVENOUS | Status: DC | PRN
  Administered 2018-06-28: 10 mg via INTRAVENOUS
  Administered 2018-06-28: 50 mg via INTRAVENOUS

## 2018-06-28 MED ORDER — FENTANYL CITRATE (PF) 100 MCG/2ML IJ SOLN
INTRAMUSCULAR | Status: AC
  Filled 2018-06-28: qty 2

## 2018-06-28 MED ORDER — ACETAMINOPHEN 325 MG PO TABS: 325.0000 mg | ORAL_TABLET | ORAL | Status: DC | PRN

## 2018-06-28 MED ORDER — IOPAMIDOL (ISOVUE-200) INJECTION 41%
INTRAVENOUS | Status: DC | PRN
  Administered 2018-06-28: 30 mL via INTRAVENOUS

## 2018-06-28 MED ORDER — DEXAMETHASONE SODIUM PHOSPHATE 10 MG/ML IJ SOLN
INTRAMUSCULAR | Status: DC | PRN
  Administered 2018-06-28: 5 mg via INTRAVENOUS

## 2018-06-28 MED ORDER — ONDANSETRON HCL 4 MG/2ML IJ SOLN
INTRAMUSCULAR | Status: DC | PRN
  Administered 2018-06-28: 4 mg via INTRAVENOUS

## 2018-06-28 MED ORDER — ALBUTEROL SULFATE HFA 108 (90 BASE) MCG/ACT IN AERS
INHALATION_SPRAY | RESPIRATORY_TRACT | Status: AC
  Filled 2018-06-28: qty 6.7

## 2018-06-28 MED ORDER — HYDROCODONE-ACETAMINOPHEN 5-325 MG PO TABS: 1.0000 | ORAL_TABLET | ORAL | 0 refills | Status: AC | PRN

## 2018-06-28 MED ORDER — LIDOCAINE HCL (CARDIAC) PF 100 MG/5ML IV SOSY
PREFILLED_SYRINGE | INTRAVENOUS | Status: DC | PRN
  Administered 2018-06-28: 100 mg via INTRAVENOUS

## 2018-06-28 MED ORDER — CEFAZOLIN SODIUM-DEXTROSE 2-4 GM/100ML-% IV SOLN
INTRAVENOUS | Status: AC
  Filled 2018-06-28: qty 100

## 2018-06-28 MED ORDER — CEFAZOLIN SODIUM-DEXTROSE 2-4 GM/100ML-% IV SOLN
2.0000 g | INTRAVENOUS | Status: AC
  Administered 2018-06-28: 2 g via INTRAVENOUS

## 2018-06-28 MED ORDER — HYDROCODONE-ACETAMINOPHEN 7.5-325 MG PO TABS: 1.0000 | ORAL_TABLET | Freq: Once | ORAL | Status: DC | PRN

## 2018-06-28 MED ORDER — MEPERIDINE HCL 50 MG/ML IJ SOLN: 6.2500 mg | INTRAMUSCULAR | Status: DC | PRN

## 2018-06-28 MED ORDER — MIDAZOLAM HCL 2 MG/2ML IJ SOLN
INTRAMUSCULAR | Status: DC | PRN
  Administered 2018-06-28: 2 mg via INTRAVENOUS

## 2018-06-28 MED ORDER — DEXAMETHASONE SODIUM PHOSPHATE 10 MG/ML IJ SOLN
INTRAMUSCULAR | Status: AC
  Filled 2018-06-28: qty 1

## 2018-06-28 MED ORDER — SUGAMMADEX SODIUM 200 MG/2ML IV SOLN
INTRAVENOUS | Status: DC | PRN
  Administered 2018-06-28: 300 mg via INTRAVENOUS

## 2018-06-28 MED ORDER — LACTATED RINGERS IV SOLN
INTRAVENOUS | Status: DC
  Administered 2018-06-28 (×2): via INTRAVENOUS

## 2018-06-28 MED ORDER — PROPOFOL 10 MG/ML IV BOLUS
INTRAVENOUS | Status: DC | PRN
  Administered 2018-06-28: 150 mg via INTRAVENOUS

## 2018-06-28 MED ORDER — PROPOFOL 10 MG/ML IV BOLUS
INTRAVENOUS | Status: AC
  Filled 2018-06-28: qty 20

## 2018-06-28 MED ORDER — BELLADONNA ALKALOIDS-OPIUM 16.2-60 MG RE SUPP: 1.0000 | Freq: Four times a day (QID) | RECTAL | Status: DC | PRN

## 2018-06-28 SURGICAL SUPPLY — 34 items
BAG DRAIN CYSTO-URO LG1000N (MISCELLANEOUS) ×3 IMPLANT
BASKET LASER NITINOL 1.9FR (BASKET) ×1 IMPLANT
BRUSH SCRUB EZ  4% CHG (MISCELLANEOUS) ×1
BRUSH SCRUB EZ 4% CHG (MISCELLANEOUS) ×2 IMPLANT
BSKT STON RTRVL 120 1.9FR (BASKET) ×2
CATH URETL 5X70 OPEN END (CATHETERS) ×1 IMPLANT
CNTNR SPEC 2.5X3XGRAD LEK (MISCELLANEOUS) ×2
CONT SPEC 4OZ STER OR WHT (MISCELLANEOUS) ×1
CONT SPEC 4OZ STRL OR WHT (MISCELLANEOUS) ×2
CONTAINER SPEC 2.5X3XGRAD LEK (MISCELLANEOUS) IMPLANT
DRSG TELFA 4X3 1S NADH ST (GAUZE/BANDAGES/DRESSINGS) ×4 IMPLANT
ELECT REM PT RETURN 9FT ADLT (ELECTROSURGICAL) ×3
ELECTRODE REM PT RTRN 9FT ADLT (ELECTROSURGICAL) ×2 IMPLANT
GLOVE BIOGEL PI IND STRL 7.5 (GLOVE) ×2 IMPLANT
GLOVE BIOGEL PI INDICATOR 7.5 (GLOVE) ×1
GOWN STRL REUS W/ TWL LRG LVL3 (GOWN DISPOSABLE) ×2 IMPLANT
GOWN STRL REUS W/ TWL XL LVL3 (GOWN DISPOSABLE) ×2 IMPLANT
GOWN STRL REUS W/TWL LRG LVL3 (GOWN DISPOSABLE) ×3
GOWN STRL REUS W/TWL XL LVL3 (GOWN DISPOSABLE) ×3
GUIDEWIRE GREEN .038 145CM (MISCELLANEOUS) ×1 IMPLANT
GUIDEWIRE INTRO SET STRAIGHT (WIRE) ×1 IMPLANT
GUIDEWIRE STR DUAL SENSOR (WIRE) ×2 IMPLANT
INFUSOR MANOMETER BAG 3000ML (MISCELLANEOUS) ×1 IMPLANT
INTRODUCER DILATOR DOUBLE (INTRODUCER) ×1 IMPLANT
KIT BALLN UROMAX 15FX4 (MISCELLANEOUS) IMPLANT
KIT BALLN UROMAX 26 75X4 (MISCELLANEOUS) ×1
KIT TURNOVER CYSTO (KITS) ×3 IMPLANT
PACK CYSTO AR (MISCELLANEOUS) ×3 IMPLANT
SET CYSTO W/LG BORE CLAMP LF (SET/KITS/TRAYS/PACK) ×3 IMPLANT
STENT URET 6FRX28 CONTOUR (STENTS) ×1 IMPLANT
SURGILUBE 2OZ TUBE FLIPTOP (MISCELLANEOUS) ×3 IMPLANT
VALVE UROSEAL ADJ ENDO (VALVE) ×1 IMPLANT
WATER STERILE IRR 1000ML POUR (IV SOLUTION) ×3 IMPLANT
WATER STERILE IRR 3000ML UROMA (IV SOLUTION) ×3 IMPLANT

## 2018-06-28 NOTE — Discharge Instructions (Signed)

## 2018-06-28 NOTE — Anesthesia Preprocedure Evaluation (Addendum)
Anesthesia Evaluation  Patient identified by MRN, date of birth, ID band Patient awake    Reviewed: Allergy & Precautions, H&P , NPO status , reviewed documented beta blocker date and time   Airway Mallampati: II  TM Distance: >3 FB Neck ROM: full    Dental  (+) Upper Dentures, Partial Lower, Edentulous Upper, Missing   Pulmonary former smoker,    Pulmonary exam normal        Cardiovascular hypertension, Normal cardiovascular exam     Neuro/Psych    GI/Hepatic GERD  Controlled,  Endo/Other  diabetes  Renal/GU Renal disease     Musculoskeletal  (+) Arthritis ,   Abdominal   Peds  Hematology   Anesthesia Other Findings Past Medical History: No date: Arthritis     Comment:  hands, left knee No date: Diabetes mellitus without complication ([place]) 10/01/8020: Eczema No date: GERD (gastroesophageal reflux disease) 11/02/2014: Hyperlipidemia LDL goal <100 No date: Hypertension No date: Kidney stone     Comment:  YEAR H/O HEMATURIA No date: Psoriasis No date: Wears dentures     Comment:  full upper, partial lower.  Has, does not wear Past Surgical History: 02/23/2017: COLONOSCOPY WITH PROPOFOL; N/A     Comment:  Procedure: COLONOSCOPY WITH PROPOFOL;  Surgeon: [name], MD;  Location: [facility];  Service:               Endoscopy;  Laterality: N/A;  Diabetic - oral meds Aug. 2016: CYST EXCISION     Comment:  on back No date: KNEE SURGERY 03/15: NASAL SINUS SURGERY 02/23/2017: POLYPECTOMY; N/A     Comment:  Procedure: POLYPECTOMY;  Surgeon: [name], MD;                Location: [facility];  Service: Endoscopy;                Laterality: N/A; No date: tooth abstraction No date: VARICOCELE EXCISION   Reproductive/Obstetrics                            Anesthesia Physical Anesthesia Plan  ASA: III  Anesthesia Plan: General LMA   Post-op Pain  Management:    Induction: Intravenous  PONV Risk Score and Plan: 3 and Ondansetron, Treatment may vary due to age or medical condition and Midazolam  Airway Management Planned: LMA  Additional Equipment:   Intra-op Plan:   Post-operative Plan: Extubation in OR  Informed Consent: I have reviewed the patients History and Physical, chart, labs and discussed the procedure including the risks, benefits and alternatives for the proposed anesthesia with the patient or authorized representative who has indicated his/her understanding and acceptance.     Dental Advisory Given  Plan Discussed with: CRNA  Anesthesia Plan Comments:        Anesthesia Quick Evaluation

## 2018-06-28 NOTE — H&P (Signed)
UROLOGY H&P UPDATE  Agree with prior H&P dated 06/26/2018. 53 yo M with 4cm right renal mass with invasion into collecting system. Plan for right diagnostic ureteroscopy, biopsy, and stent placement for diagnosis to differentiate between renal cell carcinoma and upper tract urothelial cell carcinoma.  Cardiac: RRR Lungs: CTA bilaterally  Laterality: Right Procedure: Right diagnostic ureteroscopy, biopsy, stent placement  Urine: Urine culture 2/7 no growth  Informed consent obtained, we specifically discussed the risks of bleeding, infection, post-operative pain, need for additional procedures, stent related symptoms, inability to access collecting system, and ultimate need for definitive management pending pathology.  We again reviewed the different treatment strategies for renal cell carcinoma(laparoscopic nephrectomy), versus upper tract urothelial cell carcinoma(neoadjuvant chemotherapy and robotic nephroureterectomy).  Billey Co, MD 06/28/2018

## 2018-06-28 NOTE — Anesthesia Post-op Follow-up Note (Signed)
Anesthesia QCDR form completed.        

## 2018-06-28 NOTE — Transfer of Care (Signed)
Immediate Anesthesia Transfer of Care Note  Patient: Matthew Woodard  Procedure(s) Performed: CYSTOSCOPY WITH URETEROSCOPY AND STENT PLACEMENT (Right ) CYSTOSCOPY WITH RENAL BIOPSY (Right ) CYSTOSCOPY BLADDER BIOPSY, FULGERATION OF BLADDER (N/A ) CYSTOSCOPY WITH RETROGRADE PYELOGRAM (Right )  Patient Location: PACU  Anesthesia Type:General  Level of Consciousness: awake, alert  and oriented  Airway & Oxygen Therapy: Patient Spontanous Breathing and Patient connected to face mask oxygen  Post-op Assessment: Report given to RN, Post -op Vital signs reviewed and stable and Patient moving all extremities  Post vital signs: Reviewed and stable  Last Vitals:  Vitals Value Taken Time  BP 181/80 06/28/2018  3:53 PM  Temp 36.3 C 06/28/2018  3:50 PM  Pulse 70 06/28/2018  3:55 PM  Resp 19 06/28/2018  3:55 PM  SpO2 100 % 06/28/2018  3:55 PM  Vitals shown include unvalidated device data.  Last Pain:  Vitals:   06/28/18 1550  TempSrc:   PainSc: Asleep         Complications: No apparent anesthesia complications

## 2018-06-28 NOTE — Anesthesia Postprocedure Evaluation (Signed)
Anesthesia Post Note  Patient: Matthew Woodard  Procedure(s) Performed: CYSTOSCOPY WITH URETEROSCOPY AND STENT PLACEMENT (Right ) CYSTOSCOPY WITH RENAL BIOPSY (Right ) CYSTOSCOPY BLADDER BIOPSY, FULGERATION OF BLADDER (N/A ) CYSTOSCOPY WITH RETROGRADE PYELOGRAM (Right )  Patient location during evaluation: PACU Anesthesia Type: General Level of consciousness: awake and alert Pain management: pain level controlled Vital Signs Assessment: post-procedure vital signs reviewed and stable Respiratory status: spontaneous breathing, nonlabored ventilation, respiratory function stable and patient connected to nasal cannula oxygen Cardiovascular status: blood pressure returned to baseline and stable Postop Assessment: no apparent nausea or vomiting Anesthetic complications: no     Last Vitals:  Vitals:   06/28/18 1620 06/28/18 1641  BP: (!) 155/80 (!) 165/73  Pulse: 78 83  Resp: 13 16  Temp: 36.4 C 36.6 C  SpO2: 96% 98%    Last Pain:  Vitals:   06/28/18 1641  TempSrc: Temporal  PainSc: 4                  Martha Clan

## 2018-06-28 NOTE — Op Note (Signed)
Date of procedure: 06/28/18  Preoperative diagnosis:  1. Right renal mass  Postoperative diagnosis:  1. Same  Procedure: 1. Cystoscopy, bladder biopsy, right retrograde pyelogram with intraoperative interpretation, right diagnostic ureteroscopy, right renal pelvis biopsy, right ureteral stent placement  Surgeon: Nickolas Madrid, MD  Anesthesia: General  Complications: None  Intraoperative findings:  1.  Bladder with very small 2 mm area of papillary erythema just above the right ureteral orifice, biopsied and fulgurated 2.  Large filling defect in the right renal pelvis and lower pole on retrograde pyelogram 3.  Abnormal tissue in right renal pelvis not classic for urothelial cell carcinoma appearance, biopsy taken, cytology sent 4.  Uncomplicated right ureteral stent placement  EBL: Minimal  Specimens:  1.  Right renal pelvis biopsy 2.  Bladder biopsy  Drains: Right 6 French by 28 cm ureteral stent  Indication: Matthew Woodard is a 53 y.o. patient with large 4 cm enhancing right renal mass with invasion into the collecting system.  He elected to undergo right diagnostic ureteroscopy to differentiate between renal cell carcinoma and upper tract urothelial cell carcinoma.  After reviewing the management options for treatment, they elected to proceed with the above surgical procedure(s). We have discussed the potential benefits and risks of the procedure, side effects of the proposed treatment, the likelihood of the patient achieving the goals of the procedure, and any potential problems that might occur during the procedure or recuperation. Informed consent has been obtained.  Description of procedure:  The patient was taken to the operating room and general anesthesia was induced.  SCDs were placed for DVT prophylaxis.  The patient was placed in the dorsal lithotomy position, prepped and draped in the usual sterile fashion, and preoperative antibiotics were administered. A  preoperative time-out was performed.   A 21 French rigid cystoscope was used to intubate the urethra and a normal-appearing urethra was followed proximally into the bladder.  The prostate was moderate in size.  Thorough cystoscopy was performed.  There was a very subtle 2 mm area of papillary erythema just above the right ureteral orifice.  I elected to biopsy this at the end of the case, as I did not want to distort the anatomy of the right ureteral orifice before performing ureteroscopy.  A right retrograde pyelogram was performed with a 5 [place] access catheter and demonstrated a normal-appearing ureter with a large filling defect in the renal pelvis and lower pole consistent with known tumor.  A sensor wire was advanced into the renal pelvis under fluoroscopic vision.  I attempted to dilate the ureter with a dual-lumen access catheter, however met resistance in the distal ureter.  I then used 8/10 [place] serial dilators, however again the 10 [place] dilator met resistance in the distal ureter.  A second safety sensor wire was added.  I attempted to pass the flexible single-channel ureteroscope, however again met resistance in the distal ureter.  Under direct vision the ureter appeared diffusely narrowed, but there was no tumor seen.  I then used a 15 [place] UroMax dilating balloon to dilate the distal ureter.  The flexible ureteroscope then advanced easily up to the proximal ureter, and again met resistance.  Again, under direct vision the ureter was diffusely narrowed but there was no tumor seen.  I was still unable to pass the scope into the collecting system.  I removed the safety wire, and still was unable to pass the scope.  At this point I passed a Super Stiff wire through the  scope into the collecting system and was able to advance the scope over the wire.  Thorough pyeloscopy was performed.  There was a large mass that occupied most of the renal pelvis and lower pole, which made navigation difficult.   This appeared to be a mix of clot and bullous appearing tumor.  There were no papillary changes concerning for classic upper tract urothelial cell carcinoma.  Saline was used to obtain a wash cytology.  An N-circle basket was advanced through the scope and used to basket a large portion of the tumor.  This was removed from the patient and sent for pathology.  I then advanced a sensor wire back into the collecting system, and a 39F x 28cm stent was placed in uncomplicated fashion with a good curl in the renal pelvis, and a curl in the bladder under direct vision.  At this point I used the cold cup biopsy forceps to biopsy the small papillary appearing lesion just above the right ureteral orifice.  A Bugbee was used to achieve excellent hemostasis.  Under low flow conditions there was no bleeding noted.  There was light pink urine that drained through the right ureteral stent.  Disposition: Stable to PACU  Plan: Follow-up pathology from renal pelvis biopsy and bladder biopsy Stent duration TBD pending pathology results  Nickolas Madrid, MD

## 2018-06-28 NOTE — Anesthesia Procedure Notes (Addendum)
Procedure Name: Intubation Date/Time: 06/28/2018 1:43 AM Performed by: Lynnda Shields, RN Pre-anesthesia Checklist: Patient identified, Emergency Drugs available, Suction available, Patient being monitored and Timeout performed Patient Re-evaluated:Patient Re-evaluated prior to induction Oxygen Delivery Method: Circle system utilized Preoxygenation: Pre-oxygenation with 100% oxygen Induction Type: IV induction Ventilation: Mask ventilation without difficulty and Oral airway inserted - appropriate to patient size Laryngoscope Size: 4 and Mac Grade View: Grade I Tube type: Oral Tube size: 7.5 mm Number of attempts: 1 Airway Equipment and Method: Stylet Placement Confirmation: ETT inserted through vocal cords under direct vision,  positive ETCO2,  CO2 detector and breath sounds checked- equal and bilateral Secured at: 23 cm Tube secured with: Tape Dental Injury: Teeth and Oropharynx as per pre-operative assessment

## 2018-06-29 LAB — CULTURE, URINE COMPREHENSIVE

## 2018-07-01 ENCOUNTER — Encounter: Payer: Self-pay | Admitting: Urology

## 2018-07-03 LAB — CYTOLOGY - NON PAP

## 2018-07-04 LAB — SURGICAL PATHOLOGY

## 2018-07-05 ENCOUNTER — Encounter: Payer: Self-pay | Admitting: Family Medicine

## 2018-07-09 ENCOUNTER — Encounter: Payer: Self-pay | Admitting: Urology

## 2018-07-09 ENCOUNTER — Ambulatory Visit (INDEPENDENT_AMBULATORY_CARE_PROVIDER_SITE_OTHER): Payer: BLUE CROSS/BLUE SHIELD | Admitting: Urology

## 2018-07-09 VITALS — BP 116/69 | HR 96 | Ht 72.0 in | Wt 235.0 lb

## 2018-07-09 DIAGNOSIS — N2889 Other specified disorders of kidney and ureter: Secondary | ICD-10-CM | POA: Diagnosis not present

## 2018-07-09 NOTE — Progress Notes (Signed)
   07/09/2018 5:13 PM   Matthew Woodard Oct 08, 1965 [id number]  Reason for visit: Follow up right renal mass  HPI: I saw Matthew Woodard back in urology clinic to discuss his pathology results.  To briefly summarize, he is a 53 year old healthy man with normal renal function who has a history of gross hematuria and on CT urogram was found to have a right 4 cm central enhancing renal mass with invasion into the collecting system on delayed films.  There was no evidence of metastatic disease on CT or chest x-ray.  Voided cytology was negative.  We had previously discussed the need for further evaluation with diagnostic ureteroscopy and biopsy to determine if this was renal cell carcinoma or urothelial cell carcinoma, as the treatment approach differs.  On 06/28/2018 he underwent right diagnostic ureteroscopy which showed abnormal tissue in the right renal pelvis that was not classic in appearance for urothelial cell carcinoma.  Directed cytology was sent and returned atypical, and tissue biopsy showed atypical cell clusters.  I discussed his case with the pathologist, and they were unable to give further information if this malignancy was parenchymal or urothelial in origin.  We did very long conversation today about his pathology and cytology results, as well as his CT imaging findings.  His imaging and pathology findings are most consistent with likely renal cell carcinoma as opposed to a urothelial cell carcinoma, although I cannot definitively say 100% that this is not a urothelial cell malignancy.  We discussed that regardless, the kidney would need to be removed.  It is not amenable to partial nephrectomy.  We discussed options including repeat attempt at upper tract biopsy, percutaneous biopsy, proceeding with a right hand-assisted laparoscopic radical nephrectomy, or radical nephroureterectomy.  We discussed that final pathology will determine if further treatment is needed and long-term prognosis.   Regardless, he will require close surveillance imaging.  We reviewed the different treatment options for renal cell carcinoma versus upper tract urothelial cell carcinoma, and the increased complication rate and longer catheter duration for a nephro ureterectomy.  We discussed the small, but not 0 risk, of need for distal ureterectomy in the future and/or adjuvant chemotherapy if his final pathology suggested urothelial cell carcinoma.  He would like to proceed with right laparoscopic radical nephrectomy.  We discussed the risks and benefits at length including bleeding, infection, need for additional procedures, injury to surrounding structures, postoperative pain, and 1 to 2-day hospitalization  Schedule right hand-assisted laparoscopic nephrectomy  A total of 15 minutes were spent face-to-face with the patient, greater than 50% was spent in patient education, counseling, and coordination of care regarding renal mass, treatment options, and laparoscopic nephrectomy.   Billey Co, [facility] [address], [place], [place] [address] [phone]

## 2018-07-09 NOTE — Patient Instructions (Signed)
Laparoscopic Nephrectomy     Laparoscopic nephrectomy is a surgical procedure to remove a kidney. This procedure is done through several small incisions in your abdomen. In this procedure, you may have your entire kidney and some surrounding structures removed (radical nephrectomy), or you may have only the damaged or diseased part of your kidney removed (partial nephrectomy). You may need this surgery if your kidney is severely damaged from kidney disease, infection, or cancer. You may also need this procedure if you were born with an abnormal kidney or if you are donating a healthy kidney. Tell a health care provider about:  Any allergies you have.  All medicines you are taking, including vitamins, herbs, eye drops, creams, and over-the-counter medicines.  Any problems you or family members have had with anesthetic medicines.  Any blood disorders you have.  Any surgeries you have had.  Any medical conditions you have.  Whether you are pregnant or may be pregnant. What are the risks? Generally, this is a safe procedure. However, problems may occur, including:  Bleeding.  Infection.  Pneumonia.  Damage to other body structures near the kidney.  Allergic reactions to medicines. A bone or additional tissue may need to be removed so the surgeon can get to your kidney. If this happens, the laparoscopic procedure may need to be changed to an open procedure. What happens before the procedure? Staying hydrated Follow instructions from your health care provider about hydration, which may include:  Up to 2 hours before the procedure - you may continue to drink clear liquids, such as water, clear fruit juice, black coffee, and plain tea. Eating and drinking restrictions Follow instructions from your health care provider about eating and drinking, which may include:  8 hours before the procedure - stop eating heavy meals or foods such as meat, fried foods, or fatty foods.  6 hours  before the procedure - stop eating light meals or foods, such as toast or cereal.  6 hours before the procedure - stop drinking milk or drinks that contain milk.  2 hours before the procedure - stop drinking clear liquids. Medicines  Ask your health care provider about: ? Changing or stopping your regular medicines. This is especially important if you are taking diabetes medicines or blood thinners. ? Taking medicines such as aspirin and ibuprofen. These medicines can thin your blood. Do not take these medicines before your procedure if your health care provider instructs you not to.  You may be given antibiotic medicine to help prevent infection. General instructions  Plan to have someone take you home after the procedure.  Do not use tobacco products, including cigarettes, chewing tobacco, or e-cigarettes. If you need help quitting, ask your health care provider.  Ask your health care provider how your surgical site will be marked or identified. What happens during the procedure?  To reduce your risk of infection: ? Your health care team will wash or sanitize their hands. ? Your skin will be washed with soap. ? Hair may be removed from the surgical area.  An IV tube will be inserted into one of your veins.  You will be given a medicine that makes you fall asleep (general anesthetic).  A tube will be inserted to drain urine (urinary catheter).  A small incision will be made in your abdomen to insert a thin telescope with a camera (laparoscope). This lets your surgeon see your kidney during the procedure.  More small incisions will be made to insert laparoscopic operating tools.  If you are having a partial nephrectomy, only the diseased part of your kidney will be removed.  If you are having a radical nephrectomy: ? All of the blood vessels that attach to your kidney will be separated and tied off. ? Part of the tube that carries urine from your kidney to your bladder  (ureter) will be removed. ? Your kidney will be removed.  One incision may be slightly larger to remove your kidney.  Bleeding in the surgical area will be controlled.  A small tube (drain) may be placed near one of the incisions to drain extra fluid from the surgical area.  The incisions will be closed with stitches (sutures) or another type of closure.  A bandage (dressing) will be placed over the incision. The procedure may vary among health care providers and hospitals. What happens after the procedure?  Your blood pressure, heart rate, breathing rate, and blood oxygen level will be monitored often until the medicines you were given have worn off.  Your IV will stay in place until you can drink fluids on your own. Your urine output will be checked.  Your urinary catheter may be removed.  You will be encouraged to walk as soon as possible.  You will be shown how to do breathing exercises, such as coughing and breathing deeply. These help prevent pneumonia. This information is not intended to replace advice given to you by your health care provider. Make sure you discuss any questions you have with your health care provider. Document Released: 01/20/2015 Document Revised: 01/07/2018 Document Reviewed: 09/23/2014 Elsevier Interactive Patient Education  2019 [place].   Laparoscopic Nephrectomy, Care After Refer to this sheet in the next few weeks. These instructions provide you with information on caring for yourself after your procedure. Your health care provider may also give you more specific instructions. Your treatment has been planned according to current medical practices, but problems sometimes occur. Call your health care provider if you have any problems or questions after your procedure. What can I expect after the procedure? After the procedure, it is common to have:  Pain.  Soreness and numbness in your incision areas. Follow these instructions at  home: Activity  Return to your normal activities as told by your health care provider. Ask what activities are safe for you.  Do not lift anything that is heavier than 10 lb (4.5 kg) until your health care provider approves. Bathing  Do not take baths, swim, or use a hot tub until your health care provider approves. Ask your health care provider if you can take showers. You may only be allowed to take sponge baths for bathing.  Keep the bandage (dressing) dry until your health care provider says it can be removed. Incision care   Follow instructions from your health care provider about how to take care of your incisions. Make sure you: ? Wash your hands with soap and water before you change your dressing. If soap and water are not available, use hand sanitizer. ? Change your dressing as told by your health care provider. ? Leave stitches (sutures), skin glue, or adhesive strips in place. These skin closures may need to be in place for 2 weeks or longer. If adhesive strip edges start to loosen and curl up, you may trim the loose edges. Do not remove adhesive strips completely unless your health care provider tells you to do that.  Check your incision area every day for signs of infection. Watch for: ? Redness, swelling,  or pain. ? Fluid, blood, or pus. General instructions  Take medicines only as directed by your health care provider.  Do not drive or operate heavy machinery while taking prescription pain medicine.  Keep all follow-up visits as told by your health care provider. This is important.  Drink enough fluid to keep your urine clear or pale yellow.  If you have a drain, follow instructions from your health care provider about how to care for it. Contact a health care provider if:  Your pain is worse.  You have redness, swelling, or pain at your incision site.  You have a bad smell coming from the wound or dressing. Get help right away if:  You have a rash.  You  have trouble breathing or feel short of breath.  You feel light-headed or dizzy.  You have blood in your urine.  You have fluid, blood, or pus coming from your incision.  You have a fever. This information is not intended to replace advice given to you by your health care provider. Make sure you discuss any questions you have with your health care provider. Document Released: 01/20/2015 Document Revised: 01/07/2018 Document Reviewed: 09/23/2014 Elsevier Interactive Patient Education  [employer].

## 2018-07-10 ENCOUNTER — Ambulatory Visit: Payer: BLUE CROSS/BLUE SHIELD | Admitting: Urology

## 2018-07-15 ENCOUNTER — Other Ambulatory Visit: Payer: Self-pay | Admitting: Radiology

## 2018-07-15 DIAGNOSIS — N2889 Other specified disorders of kidney and ureter: Secondary | ICD-10-CM

## 2018-07-17 ENCOUNTER — Encounter
Admission: RE | Admit: 2018-07-17 | Discharge: 2018-07-17 | Disposition: A | Discharge status: Home / Self Care | Payer: BLUE CROSS/BLUE SHIELD | Source: Ambulatory Visit | Attending: Urology | Admitting: Urology

## 2018-07-17 ENCOUNTER — Other Ambulatory Visit: Payer: Self-pay

## 2018-07-17 NOTE — Patient Instructions (Signed)
Your procedure is scheduled on: 07-22-18 MONDAY Report to Same Day Surgery 2nd floor medical mall [facility] Entrance-take elevator on left to 2nd floor.  Check in with surgery information desk.) To find out your arrival time please call [phone] between 1PM - 3PM on 07-19-18 FRIDAY  Remember: Instructions that are not followed completely may result in serious medical risk, up to and including death, or upon the discretion of your surgeon and anesthesiologist your surgery may need to be rescheduled.    _x___ 1. Do not eat food after midnight the night before your procedure. NO GUM OR CANDY AFTER MIDNIGHT. You may drink WATER up to 2 hours before you are scheduled to arrive at the hospital for your procedure.  Do not drink WATER  within 2 hours of your scheduled arrival to the hospital.  Type 1 and type 2 diabetics should only drink water.   ____Ensure clear carbohydrate drink on the way to the hospital for bariatric patients  ____Ensure clear carbohydrate drink 3 hours before surgery for Dr Dwyane Luo patients if physician instructed.    __x__ 2. No Alcohol for 24 hours before or after surgery.   __x__3. No Smoking or e-cigarettes for 24 prior to surgery.  Do not use any chewable tobacco products for at least 6 hour prior to surgery   ____  4. Bring all medications with you on the day of surgery if instructed.    __x__ 5. Notify your doctor if there is any change in your medical condition     (cold, fever, infections).    x___6. On the morning of surgery brush your teeth with toothpaste and water.  You may rinse your mouth with mouth wash if you wish.  Do not swallow any toothpaste or mouthwash.   Do not wear jewelry, make-up, hairpins, clips or nail polish.  Do not wear lotions, powders, or perfumes. You may wear deodorant.  Do not shave 48 hours prior to surgery. Men may shave face and neck.  Do not bring valuables to the hospital.    [facility] is not responsible for any  belongings or valuables.               Contacts, dentures or bridgework may not be worn into surgery.  Leave your suitcase in the car. After surgery it may be brought to your room.  For patients admitted to the hospital, discharge time is determined by your  treatment team.  _  Patients discharged the day of surgery will not be allowed to drive home.  You will need someone to drive you home and stay with you the night of your procedure.    Please read over the following fact sheets that you were given:   [facility] Preparing for Surgery   _x___ TAKE THE FOLLOWING MEDICATION THE MORNING OF SURGERY WITH A SMALL SIP OF WATER. These include:  1. PRILOSEC (OMEPRAZOLE)  2. TAKE A PRILOSEC THE NIGHT BEFORE SURGERY  3.  4.  5.  6.  ____Fleets enema or Magnesium Citrate as directed.   _x___ Use CHG Soap or sage wipes as directed on instruction sheet   ____ Use inhalers on the day of surgery and bring to hospital day of surgery  _X___ Stop Metformin 2 days prior to surgery-LAST DOSE ON Friday, MARCH 6TH   ____ Take 1/2 of usual insulin dose the night before surgery and none on the morning surgery.   ____ Follow recommendations from Cardiologist, Pulmonologist or PCP regarding stopping Aspirin,  Coumadin, Plavix ,Eliquis, Effient, or Pradaxa, and Pletal.  X____Stop Anti-inflammatories such as Advil, Aleve, Ibuprofen, Motrin, Naproxen, Naprosyn, Goodies powders or aspirin products NOW- OK to take Tylenol    _x___ Stop supplements until after surgery-STOP GLUCOSAMINE-CHONDROITIN AND FISH OIL NOW-MAY RESUME AFTER SURGERY   ____ Bring C-Pap to the hospital.

## 2018-07-18 ENCOUNTER — Encounter
Admission: RE | Admit: 2018-07-18 | Discharge: 2018-07-18 | Disposition: A | Discharge status: Home / Self Care | Payer: BLUE CROSS/BLUE SHIELD | Source: Ambulatory Visit | Attending: Urology | Admitting: Urology

## 2018-07-18 DIAGNOSIS — Z01812 Encounter for preprocedural laboratory examination: Secondary | ICD-10-CM | POA: Diagnosis not present

## 2018-07-18 LAB — PROTIME-INR
INR: 1 (ref 0.8–1.2)
Prothrombin Time: 13.2 seconds (ref 11.4–15.2)

## 2018-07-18 LAB — TYPE AND SCREEN
ABO/RH(D): O POS
Antibody Screen: NEGATIVE

## 2018-07-22 ENCOUNTER — Inpatient Hospital Stay
Admission: RE | Admit: 2018-07-22 | Discharge: 2018-07-24 | DRG: 658 | Disposition: A | Discharge status: Home / Self Care | Payer: BLUE CROSS/BLUE SHIELD | Attending: Urology | Admitting: Urology

## 2018-07-22 ENCOUNTER — Encounter
Admission: RE | Disposition: A | Discharge status: Home / Self Care | Payer: Self-pay | Source: Home / Self Care | Attending: Urology

## 2018-07-22 ENCOUNTER — Ambulatory Visit: Payer: BLUE CROSS/BLUE SHIELD | Admitting: Certified Registered"

## 2018-07-22 ENCOUNTER — Other Ambulatory Visit: Payer: Self-pay

## 2018-07-22 ENCOUNTER — Encounter: Payer: Self-pay | Admitting: *Deleted

## 2018-07-22 DIAGNOSIS — N2889 Other specified disorders of kidney and ureter: Secondary | ICD-10-CM

## 2018-07-22 DIAGNOSIS — Z800 Family history of malignant neoplasm of digestive organs: Secondary | ICD-10-CM

## 2018-07-22 DIAGNOSIS — I10 Essential (primary) hypertension: Secondary | ICD-10-CM | POA: Diagnosis present

## 2018-07-22 DIAGNOSIS — R319 Hematuria, unspecified: Secondary | ICD-10-CM | POA: Diagnosis present

## 2018-07-22 DIAGNOSIS — Z23 Encounter for immunization: Secondary | ICD-10-CM | POA: Diagnosis not present

## 2018-07-22 DIAGNOSIS — C649 Malignant neoplasm of unspecified kidney, except renal pelvis: Secondary | ICD-10-CM | POA: Diagnosis present

## 2018-07-22 DIAGNOSIS — E119 Type 2 diabetes mellitus without complications: Secondary | ICD-10-CM | POA: Diagnosis present

## 2018-07-22 DIAGNOSIS — Z87891 Personal history of nicotine dependence: Secondary | ICD-10-CM

## 2018-07-22 DIAGNOSIS — E785 Hyperlipidemia, unspecified: Secondary | ICD-10-CM | POA: Diagnosis present

## 2018-07-22 DIAGNOSIS — Z8249 Family history of ischemic heart disease and other diseases of the circulatory system: Secondary | ICD-10-CM

## 2018-07-22 DIAGNOSIS — Z905 Acquired absence of kidney: Secondary | ICD-10-CM | POA: Diagnosis not present

## 2018-07-22 DIAGNOSIS — D4101 Neoplasm of uncertain behavior of right kidney: Secondary | ICD-10-CM

## 2018-07-22 DIAGNOSIS — C641 Malignant neoplasm of right kidney, except renal pelvis: Principal | ICD-10-CM | POA: Diagnosis present

## 2018-07-22 DIAGNOSIS — Z825 Family history of asthma and other chronic lower respiratory diseases: Secondary | ICD-10-CM

## 2018-07-22 DIAGNOSIS — Z87442 Personal history of urinary calculi: Secondary | ICD-10-CM | POA: Diagnosis not present

## 2018-07-22 DIAGNOSIS — K219 Gastro-esophageal reflux disease without esophagitis: Secondary | ICD-10-CM | POA: Diagnosis present

## 2018-07-22 HISTORY — PX: CYSTOSCOPY W/ URETERAL STENT REMOVAL: SHX1430

## 2018-07-22 HISTORY — PX: LAPAROSCOPIC NEPHRECTOMY, HAND ASSISTED: SHX1929

## 2018-07-22 LAB — GLUCOSE, CAPILLARY
Glucose-Capillary: 149 mg/dL — ABNORMAL HIGH (ref 70–99)
Glucose-Capillary: 197 mg/dL — ABNORMAL HIGH (ref 70–99)
Glucose-Capillary: 178 mg/dL — ABNORMAL HIGH (ref 70–99)

## 2018-07-22 LAB — ABO/RH: ABO/RH(D): O POS

## 2018-07-22 SURGERY — NEPHRECTOMY, HAND-ASSISTED, LAPAROSCOPIC
Anesthesia: General | Site: Ureter | Laterality: Right

## 2018-07-22 MED ORDER — ROSUVASTATIN CALCIUM 10 MG PO TABS
10.0000 mg | ORAL_TABLET | Freq: Every day | ORAL | Status: DC
  Administered 2018-07-22 – 2018-07-23 (×2): 10 mg via ORAL
  Filled 2018-07-22 (×2): qty 1

## 2018-07-22 MED ORDER — ONDANSETRON HCL 4 MG/2ML IJ SOLN
4.0000 mg | INTRAMUSCULAR | Status: DC | PRN
  Administered 2018-07-22: 4 mg via INTRAVENOUS
  Filled 2018-07-22: qty 2

## 2018-07-22 MED ORDER — SUGAMMADEX SODIUM 200 MG/2ML IV SOLN
INTRAVENOUS | Status: DC | PRN
  Administered 2018-07-22: 225 mg via INTRAVENOUS

## 2018-07-22 MED ORDER — FENTANYL CITRATE (PF) 100 MCG/2ML IJ SOLN
INTRAMUSCULAR | Status: AC
  Administered 2018-07-22: 50 ug via INTRAVENOUS
  Filled 2018-07-22: qty 2

## 2018-07-22 MED ORDER — HEPARIN SODIUM (PORCINE) 5000 UNIT/ML IJ SOLN
INTRAMUSCULAR | Status: AC
  Administered 2018-07-22: 5000 [IU] via SUBCUTANEOUS
  Filled 2018-07-22: qty 1

## 2018-07-22 MED ORDER — PROMETHAZINE HCL 25 MG/ML IJ SOLN: 6.2500 mg | INTRAMUSCULAR | Status: DC | PRN

## 2018-07-22 MED ORDER — CEFAZOLIN SODIUM-DEXTROSE 2-4 GM/100ML-% IV SOLN
INTRAVENOUS | Status: AC
  Filled 2018-07-22: qty 100

## 2018-07-22 MED ORDER — FENTANYL CITRATE (PF) 100 MCG/2ML IJ SOLN
25.0000 ug | INTRAMUSCULAR | Status: DC | PRN
  Administered 2018-07-22 (×2): 50 ug via INTRAVENOUS

## 2018-07-22 MED ORDER — LACTATED RINGERS IV SOLN
INTRAVENOUS | Status: DC | PRN
  Administered 2018-07-22: 14:00:00 via INTRAVENOUS

## 2018-07-22 MED ORDER — HEPARIN SODIUM (PORCINE) 5000 UNIT/ML IJ SOLN
5000.0000 [IU] | INTRAMUSCULAR | Status: AC
  Administered 2018-07-22: 5000 [IU] via SUBCUTANEOUS

## 2018-07-22 MED ORDER — ACETAMINOPHEN 10 MG/ML IV SOLN
INTRAVENOUS | Status: DC | PRN
  Administered 2018-07-22: 1000 mg via INTRAVENOUS

## 2018-07-22 MED ORDER — BUPIVACAINE LIPOSOME 1.3 % IJ SUSP
INTRAMUSCULAR | Status: DC | PRN
  Administered 2018-07-22: 40 mL

## 2018-07-22 MED ORDER — FENTANYL CITRATE (PF) 250 MCG/5ML IJ SOLN
INTRAMUSCULAR | Status: AC
  Filled 2018-07-22: qty 5

## 2018-07-22 MED ORDER — PANTOPRAZOLE SODIUM 40 MG PO TBEC
40.0000 mg | DELAYED_RELEASE_TABLET | Freq: Every day | ORAL | Status: DC
  Administered 2018-07-23 – 2018-07-24 (×2): 40 mg via ORAL
  Filled 2018-07-22 (×2): qty 1

## 2018-07-22 MED ORDER — HYDROMORPHONE HCL 1 MG/ML IJ SOLN
0.5000 mg | INTRAMUSCULAR | Status: DC | PRN
  Administered 2018-07-22 – 2018-07-24 (×4): 1 mg via INTRAVENOUS
  Filled 2018-07-22 (×5): qty 1

## 2018-07-22 MED ORDER — ROCURONIUM BROMIDE 100 MG/10ML IV SOLN
INTRAVENOUS | Status: DC | PRN
  Administered 2018-07-22: 20 mg via INTRAVENOUS
  Administered 2018-07-22: 70 mg via INTRAVENOUS
  Administered 2018-07-22 (×2): 30 mg via INTRAVENOUS

## 2018-07-22 MED ORDER — PROPOFOL 10 MG/ML IV BOLUS
INTRAVENOUS | Status: DC | PRN
  Administered 2018-07-22: 200 mg via INTRAVENOUS

## 2018-07-22 MED ORDER — ACETAMINOPHEN 325 MG PO TABS: 650.0000 mg | ORAL_TABLET | ORAL | Status: DC | PRN

## 2018-07-22 MED ORDER — ACETAMINOPHEN 10 MG/ML IV SOLN
INTRAVENOUS | Status: AC
  Filled 2018-07-22: qty 100

## 2018-07-22 MED ORDER — BELLADONNA ALKALOIDS-OPIUM 16.2-60 MG RE SUPP: 1.0000 | Freq: Four times a day (QID) | RECTAL | Status: DC | PRN

## 2018-07-22 MED ORDER — HEPARIN SODIUM (PORCINE) 5000 UNIT/ML IJ SOLN
5000.0000 [IU] | Freq: Three times a day (TID) | INTRAMUSCULAR | Status: DC
  Administered 2018-07-23 – 2018-07-24 (×5): 5000 [IU] via SUBCUTANEOUS
  Filled 2018-07-22 (×5): qty 1

## 2018-07-22 MED ORDER — ONDANSETRON HCL 4 MG/2ML IJ SOLN
INTRAMUSCULAR | Status: DC | PRN
  Administered 2018-07-22: 4 mg via INTRAVENOUS

## 2018-07-22 MED ORDER — SODIUM CHLORIDE FLUSH 0.9 % IV SOLN
INTRAVENOUS | Status: AC
  Filled 2018-07-22: qty 10

## 2018-07-22 MED ORDER — CEFAZOLIN SODIUM-DEXTROSE 2-4 GM/100ML-% IV SOLN
2.0000 g | INTRAVENOUS | Status: AC
  Administered 2018-07-22: 2 g via INTRAVENOUS

## 2018-07-22 MED ORDER — SODIUM CHLORIDE 0.9 % IV SOLN
INTRAVENOUS | Status: DC
  Administered 2018-07-22 – 2018-07-23 (×2): via INTRAVENOUS

## 2018-07-22 MED ORDER — FENTANYL CITRATE (PF) 100 MCG/2ML IJ SOLN
INTRAMUSCULAR | Status: DC | PRN
  Administered 2018-07-22 (×3): 50 ug via INTRAVENOUS
  Administered 2018-07-22: 100 ug via INTRAVENOUS

## 2018-07-22 MED ORDER — HYDROCODONE-ACETAMINOPHEN 5-325 MG PO TABS
1.0000 | ORAL_TABLET | ORAL | Status: DC | PRN
  Administered 2018-07-22: 1 via ORAL
  Administered 2018-07-23: 2 via ORAL
  Administered 2018-07-23: 1 via ORAL
  Administered 2018-07-23 – 2018-07-24 (×8): 2 via ORAL
  Filled 2018-07-22 (×8): qty 2
  Filled 2018-07-22 (×2): qty 1
  Filled 2018-07-22: qty 2

## 2018-07-22 MED ORDER — SODIUM CHLORIDE 0.9 % IV SOLN
INTRAVENOUS | Status: DC
  Administered 2018-07-22: 12:00:00 via INTRAVENOUS

## 2018-07-22 MED ORDER — DOCUSATE SODIUM 100 MG PO CAPS
100.0000 mg | ORAL_CAPSULE | Freq: Two times a day (BID) | ORAL | Status: DC
  Administered 2018-07-22 – 2018-07-24 (×4): 100 mg via ORAL
  Filled 2018-07-22 (×4): qty 1

## 2018-07-22 MED ORDER — THROMBIN 5000 UNITS EX SOLR
CUTANEOUS | Status: DC | PRN
  Administered 2018-07-22: 5000 [IU] via TOPICAL

## 2018-07-22 MED ORDER — INSULIN ASPART 100 UNIT/ML ~~LOC~~ SOLN
0.0000 [IU] | Freq: Three times a day (TID) | SUBCUTANEOUS | Status: DC
  Administered 2018-07-23 – 2018-07-24 (×3): 3 [IU] via SUBCUTANEOUS
  Administered 2018-07-24 (×2): 5 [IU] via SUBCUTANEOUS
  Filled 2018-07-22 (×5): qty 1

## 2018-07-22 MED ORDER — PROPOFOL 10 MG/ML IV BOLUS
INTRAVENOUS | Status: AC
  Filled 2018-07-22: qty 40

## 2018-07-22 MED ORDER — LIDOCAINE HCL (CARDIAC) PF 100 MG/5ML IV SOSY
PREFILLED_SYRINGE | INTRAVENOUS | Status: DC | PRN
  Administered 2018-07-22: 100 mg via INTRAVENOUS

## 2018-07-22 SURGICAL SUPPLY — 111 items
ADH SKN CLS APL DERMABOND .7 (GAUZE/BANDAGES/DRESSINGS) ×2
AGENT HMST MTR 8 SURGIFLO (HEMOSTASIS)
ANCHOR TIS RET SYS 1550ML (BAG) IMPLANT
APL ESCP 34 STRL LF DISP (HEMOSTASIS)
APL PRP STRL LF DISP 70% ISPRP (MISCELLANEOUS) ×2
APPLICATOR SURGIFLO ENDO (HEMOSTASIS) IMPLANT
APPLIER CLIP ROT 10 11.4 M/L (STAPLE)
APPLIER CLIP ROT 13.4 12 LRG (CLIP)
APR CLP LRG 13.4X12 ROT 20 MLT (CLIP)
APR CLP MED LRG 11.4X10 (STAPLE)
BAG DRAIN CYSTO-URO LG1000N (MISCELLANEOUS) ×2 IMPLANT
BAG SPEC RTRVL C1550 25.4 (BAG)
BRUSH SCRUB EZ  4% CHG (MISCELLANEOUS) ×1
BRUSH SCRUB EZ 4% CHG (MISCELLANEOUS) IMPLANT
CANISTER SUCT 1200ML W/VALVE (MISCELLANEOUS) ×3 IMPLANT
CATH URETL 5X70 OPEN END (CATHETERS) ×2 IMPLANT
CHLORAPREP W/TINT 26 (MISCELLANEOUS) ×3 IMPLANT
CLEANER CAUTERY TIP 5X5 PAD (MISCELLANEOUS) ×2 IMPLANT
CLIP APPLIE ROT 10 11.4 M/L (STAPLE) IMPLANT
CLIP APPLIE ROT 13.4 12 LRG (CLIP) IMPLANT
CLIP VESOLOCK LG 6/CT PURPLE (CLIP) ×3 IMPLANT
CONRAY 43 FOR UROLOGY 50M (MISCELLANEOUS) ×2 IMPLANT
COVER WAND RF STERILE (DRAPES) ×3 IMPLANT
CUTTER ECHEON FLEX ENDO 45 340 (ENDOMECHANICALS) IMPLANT
DEFOGGER SCOPE WARMER CLEARIFY (MISCELLANEOUS) ×4 IMPLANT
DERMABOND ADVANCED (GAUZE/BANDAGES/DRESSINGS) ×1
DERMABOND ADVANCED .7 DNX12 (GAUZE/BANDAGES/DRESSINGS) ×2 IMPLANT
DISSECTOR KITTNER STICK (MISCELLANEOUS) ×2 IMPLANT
DISSECTORS/KITTNER STICK (MISCELLANEOUS)
DRAPE SURG 17X11 SM STRL (DRAPES) ×12 IMPLANT
DRSG TELFA 3X8 NADH (GAUZE/BANDAGES/DRESSINGS) IMPLANT
ELECT REM PT RETURN 9FT ADLT (ELECTROSURGICAL) ×3
ELECTRODE REM PT RTRN 9FT ADLT (ELECTROSURGICAL) ×2 IMPLANT
GLOVE BIO SURGEON STRL SZ7.5 (GLOVE) ×3 IMPLANT
GLOVE BIOGEL PI IND STRL 7.5 (GLOVE) ×2 IMPLANT
GLOVE BIOGEL PI IND STRL 8 (GLOVE) ×2 IMPLANT
GLOVE BIOGEL PI INDICATOR 7.5 (GLOVE) ×1
GLOVE BIOGEL PI INDICATOR 8 (GLOVE) ×1
GOWN STRL REUS W/ TWL LRG LVL3 (GOWN DISPOSABLE) ×4 IMPLANT
GOWN STRL REUS W/ TWL XL LVL3 (GOWN DISPOSABLE) ×2 IMPLANT
GOWN STRL REUS W/TWL LRG LVL3 (GOWN DISPOSABLE) ×6
GOWN STRL REUS W/TWL XL LVL3 (GOWN DISPOSABLE) ×3
GRASPER SUT TROCAR 14GX15 (MISCELLANEOUS) ×3 IMPLANT
GUIDEWIRE STR DUAL SENSOR (WIRE) ×2 IMPLANT
HANDLE YANKAUER SUCT BULB TIP (MISCELLANEOUS) IMPLANT
HEMOSTAT SURGICEL 2X14 (HEMOSTASIS) IMPLANT
HOLDER FOLEY CATH W/STRAP (MISCELLANEOUS) ×3 IMPLANT
IRRIGATION STRYKERFLOW (MISCELLANEOUS) ×2 IMPLANT
IRRIGATOR STRYKERFLOW (MISCELLANEOUS) ×3
IV NS 1000ML (IV SOLUTION) ×3
IV NS 1000ML BAXH (IV SOLUTION) ×2 IMPLANT
KIT PINK PAD W/HEAD ARE REST (MISCELLANEOUS) ×3
KIT PINK PAD W/HEAD ARM REST (MISCELLANEOUS) ×2 IMPLANT
KIT TURNOVER CYSTO (KITS) ×3 IMPLANT
KIT TURNOVER KIT A (KITS) ×3 IMPLANT
L-HOOK LAP DISP 36CM (ELECTROSURGICAL)
LABEL OR SOLS (LABEL) ×3 IMPLANT
LHOOK LAP DISP 36CM (ELECTROSURGICAL) ×2 IMPLANT
LIGASURE LAP ATLAS 10MM 37CM (INSTRUMENTS) ×2 IMPLANT
LOOP RED MAXI  1X406MM (MISCELLANEOUS) ×1
LOOP VESSEL MAXI 1X406 RED (MISCELLANEOUS) ×2 IMPLANT
NDL HYPO 21X1.5 SAFETY (NEEDLE) ×2 IMPLANT
NDL INSUFFLATION 14GA 120MM (NEEDLE) IMPLANT
NEEDLE HYPO 21X1.5 SAFETY (NEEDLE) ×3 IMPLANT
NEEDLE INSUFFLATION 14GA 120MM (NEEDLE) IMPLANT
PACK CYSTO AR (MISCELLANEOUS) ×2 IMPLANT
PACK LAP CHOLECYSTECTOMY (MISCELLANEOUS) ×3 IMPLANT
PAD CLEANER CAUTERY TIP 5X5 (MISCELLANEOUS) ×1
PAD DRESSING TELFA 3X8 NADH (GAUZE/BANDAGES/DRESSINGS) IMPLANT
PENCIL ELECTRO HAND CTR (MISCELLANEOUS) ×3 IMPLANT
RELOAD STAPLE 35X2.5 WHT THIN (STAPLE) IMPLANT
RELOAD STAPLE 60 2.6 WHT THN (STAPLE) IMPLANT
RELOAD STAPLER WHITE 60MM (STAPLE) ×4 IMPLANT
RELOAD WH ECHELON 45 (STAPLE) IMPLANT
SCISSORS METZENBAUM CVD 33 (INSTRUMENTS) ×1 IMPLANT
SET CYSTO W/LG BORE CLAMP LF (SET/KITS/TRAYS/PACK) ×3 IMPLANT
SET TUBE SMOKE EVAC HIGH FLOW (TUBING) ×2 IMPLANT
SOL .9 NS 3000ML IRR  AL (IV SOLUTION)
SOL .9 NS 3000ML IRR AL (IV SOLUTION)
SOL .9 NS 3000ML IRR UROMATIC (IV SOLUTION) ×2 IMPLANT
SPOGE SURGIFLO 8M (HEMOSTASIS)
SPONGE LAP 18X18 RF (DISPOSABLE) ×3 IMPLANT
SPONGE SURGIFLO 8M (HEMOSTASIS) IMPLANT
STAPLE RELOAD 2.5MM WHITE (STAPLE) IMPLANT
STAPLER ECHELON LONG 60 440 (INSTRUMENTS) ×1 IMPLANT
STAPLER RELOAD WHITE 60MM (STAPLE) ×6
STAPLER SKIN PROX 35W (STAPLE) ×3 IMPLANT
STAPLER VASCULAR ECHELON 35 (CUTTER) IMPLANT
STENT URET 6FRX24 CONTOUR (STENTS) IMPLANT
STENT URET 6FRX26 CONTOUR (STENTS) IMPLANT
SURGILUBE 2OZ TUBE FLIPTOP (MISCELLANEOUS) ×3 IMPLANT
SUT CHROMIC 0 CT 1 (SUTURE) IMPLANT
SUT MNCRL AB 4-0 PS2 18 (SUTURE) ×6 IMPLANT
SUT PDS AB 1 CT1 36 (SUTURE) ×12 IMPLANT
SUT PDS AB 1 TP1 96 (SUTURE) ×3 IMPLANT
SUT VIC AB 0 CT1 36 (SUTURE) ×6 IMPLANT
SUT VIC AB 1 CT1 36 (SUTURE) IMPLANT
SUT VIC AB 2-0 SH 27 (SUTURE)
SUT VIC AB 2-0 SH 27XBRD (SUTURE) IMPLANT
SUT VIC AB 2-0 UR6 27 (SUTURE) IMPLANT
SUT VIC AB 4-0 FS2 27 (SUTURE) ×3 IMPLANT
SUT VICRYL 0 AB UR-6 (SUTURE) ×3 IMPLANT
SYR 20CC LL (SYRINGE) ×4 IMPLANT
SYRINGE IRR TOOMEY STRL 70CC (SYRINGE) IMPLANT
SYS LAPSCP GELPORT 120MM (MISCELLANEOUS) ×6
SYSTEM LAPSCP GELPORT 120MM (MISCELLANEOUS) ×2 IMPLANT
TRAY FOLEY MTR SLVR 16FR STAT (SET/KITS/TRAYS/PACK) ×3 IMPLANT
TROCAR ENDOPATH XCEL 12X100 BL (ENDOMECHANICALS) ×3 IMPLANT
TROCAR XCEL 12X100 BLDLESS (ENDOMECHANICALS) ×3 IMPLANT
TROCAR XCEL NON-BLD 5MMX100MML (ENDOMECHANICALS) ×3 IMPLANT
WATER STERILE IRR 1000ML POUR (IV SOLUTION) ×2 IMPLANT

## 2018-07-22 NOTE — Transfer of Care (Signed)
Immediate Anesthesia Transfer of Care Note  Patient: Matthew Woodard  Procedure(s) Performed: HAND ASSISTED LAPAROSCOPIC NEPHRECTOMY (Right Abdomen) CYSTOSCOPY WITH STENT REMOVAL (Right Ureter)  Patient Location: PACU  Anesthesia Type:General  Level of Consciousness: awake, alert  and oriented  Airway & Oxygen Therapy: Patient Spontanous Breathing and Patient connected to face mask oxygen  Post-op Assessment: Report given to RN and Post -op Vital signs reviewed and stable  Post vital signs: Reviewed and stable  Last Vitals:  Vitals Value Taken Time  BP 148/71 07/22/2018  4:49 PM  Temp    Pulse 86 07/22/2018  4:53 PM  Resp 15 07/22/2018  4:53 PM  SpO2 100 % 07/22/2018  4:53 PM  Vitals shown include unvalidated device data.  Last Pain:  Vitals:   07/22/18 1113  TempSrc: Oral  PainSc: 0-No pain         Complications: No apparent anesthesia complications

## 2018-07-22 NOTE — H&P (Signed)
WILLOW RECZEK III [birth year] [id number]  CC: Right renal mass  HPI: Mr. Schamp is a healthy 53 year old male who presented with hematuria and on CT scan was found to have a right 4 cm enhancing renal mass concerning for renal cell carcinoma.  There appeared to be invasion into the collecting system.  He previously underwent diagnostic right ureteroscopy, however tissue biopsy was nondiagnostic at that time, however it did not appear urothelial in nature.  Cytology was negative.  He presents today for definitive management with laparoscopic hand-assisted radical right nephrectomy.  He denies any fevers or chills.   PMH: Past Medical History:  Diagnosis Date  . Arthritis    hands, left knee  . Diabetes mellitus without complication ([place])   . Eczema 01/08/2017  . GERD (gastroesophageal reflux disease)   . Hyperlipidemia LDL goal <100 11/02/2014  . Hypertension   . Kidney stone    YEAR H/O HEMATURIA  . Psoriasis   . Wears dentures    full upper, partial lower.  Has, does not wear    Surgical History: Past Surgical History:  Procedure Laterality Date  . COLONOSCOPY WITH PROPOFOL N/A 02/23/2017   Procedure: COLONOSCOPY WITH PROPOFOL;  Surgeon: [name], MD;  Location: [place];  Service: Endoscopy;  Laterality: N/A;  Diabetic - oral meds  . CYST EXCISION  Aug. 2016   on back  . CYSTOSCOPY W/ RETROGRADES Right 06/28/2018   Procedure: CYSTOSCOPY WITH RETROGRADE PYELOGRAM;  Surgeon: Billey Co, MD;  Location: [facility];  Service: Urology;  Laterality: Right;  . CYSTOSCOPY WITH BIOPSY Right 06/28/2018   Procedure: CYSTOSCOPY WITH RENAL BIOPSY;  Surgeon: Billey Co, MD;  Location: [facility];  Service: Urology;  Laterality: Right;  . CYSTOSCOPY WITH URETEROSCOPY AND STENT PLACEMENT Right 06/28/2018   Procedure: CYSTOSCOPY WITH URETEROSCOPY AND STENT PLACEMENT;  Surgeon: Billey Co, MD;  Location: [facility];  Service: Urology;  Laterality: Right;  . FULGURATION  OF BLADDER TUMOR N/A 06/28/2018   Procedure: CYSTOSCOPY BLADDER BIOPSY, FULGERATION OF BLADDER;  Surgeon: Billey Co, MD;  Location: [facility];  Service: Urology;  Laterality: N/A;  . KNEE SURGERY    . NASAL SINUS SURGERY  03/15  . POLYPECTOMY N/A 02/23/2017   Procedure: POLYPECTOMY;  Surgeon: [name], MD;  Location: [place];  Service: Endoscopy;  Laterality: N/A;  . tooth abstraction    . VARICOCELE EXCISION       Allergies:  Allergies  Allergen Reactions  . Glucotrol [Glipizide] Other (See Comments)    Glucose level spiked then dropped    Family History: Family History  Problem Relation Age of Onset  . Heart disease Father   . Hypertension Father   . COPD Father   . Colon cancer Maternal Grandmother   . Heart attack Paternal Grandfather     Social History:  reports that he quit smoking about 5 years ago. His smoking use included cigarettes. He has never used smokeless tobacco. He reports that he does not drink alcohol or use drugs.  ROS: Negative aside from those stated in the HPI  Physical Exam: BP 134/77   Pulse 95   Temp 98 F (36.7 C) (Oral)   Resp 18   SpO2 98%    Constitutional:  Alert and oriented, No acute distress. Cardiovascular: Regular rate and rhythm Respiratory: Clear to auscultation bilaterally GI: Abdomen is soft, nontender, nondistended, no abdominal masses GU: No CVA tenderness Lymph: No cervical or inguinal lymphadenopathy. Skin: No rashes,  bruises or suspicious lesions. Neurologic: Grossly intact, no focal deficits, moving all 4 extremities. Psychiatric: Normal mood and affect.  Assessment & Plan:   In summary, the patient is a healthy 53 year old male with a 4 cm enhancing renal mass with suspected invasion of the collecting system.  He previously underwent diagnostic ureteroscopy which was nondiagnostic, however cytology was negative and there was no appearance consistent with urothelial cell carcinoma.  We discussed the  risks and benefits of proceeding with assumption of renal cell carcinoma with right radical hand-assisted laparoscopic nephrectomy versus repeating ureteroscopy and biopsy.  We discussed that pathology may change his follow-up and surveillance plan moving forward.  We will plan to take the ureter down distally, but avoid a true ureterectomy and bladder cuff to minimize morbidity and duration of Foley cath duration.  He understands these risks and is in agreement to proceed with radical right hand-assisted laparoscopic nephrectomy.  We discussed the risks of bleeding, infection, damage to surrounding structures, postoperative pain, need for temporary Foley placement, need for close surveillance and follow-up pending pathology.  Billey Co, [facility] [address] [place], [place] [address] [phone]

## 2018-07-22 NOTE — Anesthesia Post-op Follow-up Note (Signed)
Anesthesia QCDR form completed.        

## 2018-07-22 NOTE — Anesthesia Preprocedure Evaluation (Addendum)
Anesthesia Evaluation  Patient identified by MRN, date of birth, ID band Patient awake    Reviewed: Allergy & Precautions, H&P , NPO status , reviewed documented beta blocker date and time   History of Anesthesia Complications Negative for: history of anesthetic complications  Airway Mallampati: I  TM Distance: >3 FB Neck ROM: full    Dental  (+) Upper Dentures, Partial Lower, Edentulous Upper, Missing, Dental Advidsory Given   Pulmonary neg pulmonary ROS, former smoker,           Cardiovascular Exercise Tolerance: Good hypertension, (-) angina(-) CAD, (-) Past MI, (-) Cardiac Stents and (-) CABG (-) dysrhythmias (-) Valvular Problems/Murmurs     Neuro/Psych negative neurological ROS  negative psych ROS   GI/Hepatic Neg liver ROS, GERD  Controlled,  Endo/Other  diabetes  Renal/GU Renal disease     Musculoskeletal  (+) Arthritis ,   Abdominal   Peds  Hematology   Anesthesia Other Findings Past Medical History: No date: Arthritis     Comment:  hands, left knee No date: Diabetes mellitus without complication ([place]) 1/61/0960: Eczema No date: GERD (gastroesophageal reflux disease) 11/02/2014: Hyperlipidemia LDL goal <100 No date: Hypertension No date: Kidney stone     Comment:  YEAR H/O HEMATURIA No date: Psoriasis No date: Wears dentures     Comment:  full upper, partial lower.  Has, does not wear Past Surgical History: 02/23/2017: COLONOSCOPY WITH PROPOFOL; N/A     Comment:  Procedure: COLONOSCOPY WITH PROPOFOL;  Surgeon: [name], MD;  Location: [place];  Service:               Endoscopy;  Laterality: N/A;  Diabetic - oral meds Aug. 2016: CYST EXCISION     Comment:  on back No date: KNEE SURGERY 03/15: NASAL SINUS SURGERY 02/23/2017: POLYPECTOMY; N/A     Comment:  Procedure: POLYPECTOMY;  Surgeon: [name], MD;                Location: [place];  Service:  Endoscopy;                Laterality: N/A; No date: tooth abstraction No date: VARICOCELE EXCISION   Reproductive/Obstetrics                           Anesthesia Physical  Anesthesia Plan  ASA: III  Anesthesia Plan: General   Post-op Pain Management:    Induction: Intravenous  PONV Risk Score and Plan: 3 and Ondansetron, Treatment may vary due to age or medical condition, Midazolam, Dexamethasone and Promethazine  Airway Management Planned: Oral ETT  Additional Equipment:   Intra-op Plan:   Post-operative Plan: Extubation in OR  Informed Consent: I have reviewed the patients History and Physical, chart, labs and discussed the procedure including the risks, benefits and alternatives for the proposed anesthesia with the patient or authorized representative who has indicated his/her understanding and acceptance.     Dental Advisory Given  Plan Discussed with: CRNA  Anesthesia Plan Comments:        Anesthesia Quick Evaluation

## 2018-07-22 NOTE — Anesthesia Procedure Notes (Signed)
Procedure Name: Intubation Date/Time: 07/22/2018 1:28 PM Performed by: Chanetta Marshall, CRNA Pre-anesthesia Checklist: Patient identified, Emergency Drugs available, Suction available and Patient being monitored Patient Re-evaluated:Patient Re-evaluated prior to induction Oxygen Delivery Method: Circle system utilized Preoxygenation: Pre-oxygenation with 100% oxygen Induction Type: IV induction Ventilation: Mask ventilation without difficulty and Oral airway inserted - appropriate to patient size Laryngoscope Size: Mac and 3 Grade View: Grade I Tube type: Oral Tube size: 8.0 mm Number of attempts: 1 Airway Equipment and Method: Stylet Placement Confirmation: ETT inserted through vocal cords under direct vision,  positive ETCO2 and breath sounds checked- equal and bilateral Secured at: 23 cm Dental Injury: Teeth and Oropharynx as per pre-operative assessment

## 2018-07-22 NOTE — Op Note (Signed)
Date of procedure: 07/22/18  Preoperative diagnosis:  1. Right renal mass, 4cm  Postoperative diagnosis:  1. Same  Procedure: 1. Cystoscopy, right ureteral stent removal, laparoscopic right hand-assisted radical nephrectomy  Surgeon: Nickolas Madrid, MD  Assistant: Hollice Espy, MD  Anesthesia: General  Complications: None  Intraoperative findings:  1.  Uncomplicated right ureteral stent removal 2.  Uncomplicated right radical nephrectomy  EBL: 100 cc  Specimens: Right kidney and proximal ureter  Drains: 16 French Foley  Indication: Matthew Woodard is a 53 y.o. patient with enhancing 4 cm right renal mass.  After reviewing the management options for treatment, they elected to proceed with the above surgical procedure(s). We have discussed the potential benefits and risks of the procedure, side effects of the proposed treatment, the likelihood of the patient achieving the goals of the procedure, and any potential problems that might occur during the procedure or recuperation. Informed consent has been obtained.  Description of procedure:  The patient was taken to the operating room and general anesthesia was induced. SCDs were placed for DVT prophylaxis.  Ancef was given for preoperative antibiotics.  A preoperative timeout was performed the patient was prepped and draped in standard sterile fashion in supine position.  The flexible cystoscope was used to intubate the urethra and a normal-appearing urethra was followed proximally in the bladder.  The stent was visualized, grasped, and removed in its entirety.  A 16 French Foley was then placed in standard sterile fashion.  The patient was then placed in the modified flank position with the right side up, and carefully secured to the table.  All pressure points were padded.  He was prepped and draped in the usual sterile fashion.  We started by making an 8 cm incision in the right lower quadrant.  We dissected down through  Scarpa's to the fascial level and the peritoneum was carefully opened with Metzenbaum scissors.  The rectus muscle was pulled medially, and the hand port was inserted.  We then placed a 5 mm liver retractor just below the sternum, camera port just above the umbilicus, and another working 10/12 port halfway between the umbilicus and the sternum.  A locking grasper was used to retract the liver.  There was a small adhesion just lateral to the HandPort, and this was carefully taken down with the harmonic scissors.  The colon was then reflected medially and the ureter was identified over the psoas muscle.  The gonadal vein was spared.  The ureter was then followed up toward the hilum of the kidney.  The duodenum was carefully reflected medially, taking care to minimize handling of the bowel.  The hilar structures were identified including the renal vein and the 2 renal arteries.  We dissected around these until we could easily advance the stapler.  A 60 mm load with a vascular stapler was used to take the renal hilum en bloc.  There was excellent hemostasis.  There was a very small 2 or 3 mm area of the vein that remained patent, and this was reinforced with 2 Weck clips and then cut.  The harmonic and blunt dissection were then used to free up the rest of the kidney around Gerota's fascia.  The adrenal was spared.  We then followed the ureter distally down to the level of the iliacs, and this was taken with Weck clips.  Two Weck clips were placed distally.  The 15 mm bag was placed through the incision above the umbilicus, and the specimen was  bagged.  This was then removed through the hand port incision.  Careful inspection of the nephrectomy bed showed no active bleeding.  Surgicel was placed alongside the hilum with Surgi-Flo.  The Carter-Thomas was used to close both of the 10/12 ports under direct vision with an 0 Vicryl suture.  All incisions were copiously irrigated with normal saline.  A total of 40 cc of  Exparel anesthetic was injected into the incisions.  The fascia of the large incision was closed using 0 looped PDS in running fashion.  Interrupted Vicryl sutures were used to close the dead space, and the skin was closed with a running 4-0 Monocryl.  The port sites were also closed using a 4-0 Monocryl.  Dermabond was applied.  All counts were correct and this concluded our procedure.  Disposition: Stable to PACU  Plan: Maintain foley overnight Likely discharge tomorrow  Nickolas Madrid, MD

## 2018-07-23 ENCOUNTER — Encounter: Payer: Self-pay | Admitting: Urology

## 2018-07-23 ENCOUNTER — Telehealth: Payer: Self-pay | Admitting: Urology

## 2018-07-23 LAB — CBC
HCT: 40.1 % (ref 39.0–52.0)
Hemoglobin: 13 g/dL (ref 13.0–17.0)
MCH: 30.2 pg (ref 26.0–34.0)
MCHC: 32.4 g/dL (ref 30.0–36.0)
MCV: 93 fL (ref 80.0–100.0)
Platelets: 211 10*3/uL (ref 150–400)
RBC: 4.31 MIL/uL (ref 4.22–5.81)
RDW: 12.7 % (ref 11.5–15.5)
WBC: 7.6 10*3/uL (ref 4.0–10.5)
nRBC: 0 % (ref 0.0–0.2)

## 2018-07-23 LAB — GLUCOSE, CAPILLARY
Glucose-Capillary: 114 mg/dL — ABNORMAL HIGH (ref 70–99)
Glucose-Capillary: 167 mg/dL — ABNORMAL HIGH (ref 70–99)
Glucose-Capillary: 196 mg/dL — ABNORMAL HIGH (ref 70–99)
Glucose-Capillary: 122 mg/dL — ABNORMAL HIGH (ref 70–99)

## 2018-07-23 LAB — BASIC METABOLIC PANEL
Anion gap: 10 (ref 5–15)
BUN: 18 mg/dL (ref 6–20)
CO2: 19 mmol/L — ABNORMAL LOW (ref 22–32)
Calcium: 8.1 mg/dL — ABNORMAL LOW (ref 8.9–10.3)
Chloride: 107 mmol/L (ref 98–111)
Creatinine, Ser: 0.98 mg/dL (ref 0.61–1.24)
GFR calc Af Amer: >60 mL/min (ref >60)
GFR calc non Af Amer: >60 mL/min (ref >60)
Glucose, Bld: 174 mg/dL — ABNORMAL HIGH (ref 70–99)
Potassium: 4.3 mmol/L (ref 3.5–5.1)
Sodium: 136 mmol/L (ref 135–145)

## 2018-07-23 MED ORDER — INFLUENZA VAC SPLIT QUAD 0.5 ML IM SUSY
0.5000 mL | PREFILLED_SYRINGE | INTRAMUSCULAR | Status: AC
  Administered 2018-07-24: 0.5 mL via INTRAMUSCULAR
  Filled 2018-07-23: qty 0.5

## 2018-07-23 MED ORDER — DOCUSATE SODIUM 100 MG PO CAPS: 100.0000 mg | ORAL_CAPSULE | Freq: Every day | ORAL | 0 refills | Status: DC

## 2018-07-23 MED ORDER — HYDROCODONE-ACETAMINOPHEN 5-325 MG PO TABS: 1.0000 | ORAL_TABLET | ORAL | 0 refills | Status: AC | PRN

## 2018-07-23 NOTE — Telephone Encounter (Signed)
App made 

## 2018-07-23 NOTE — Progress Notes (Signed)
   Subjective Pain controlled with Norco x 2 overnight Denies N/V. Tolerating clears, negative flatus  Physical Exam: BP (!) 153/81 (BP Location: Right Arm)   Pulse 98   Temp 98.5 F (36.9 C) (Oral)   Resp 18   Ht 6' (1.829 m)   Wt 103 kg   SpO2 98%   BMI 30.80 kg/m    Constitutional:  Alert and oriented, No acute distress. Respiratory: Normal respiratory effort, no increased work of breathing. GI: Abdomen is soft, non-distended, appropriately tender, incisions c/d/i with dermabond Drains: Foley clear yellow  Laboratory Data: Reviewed  Assessment & Plan:   Mr. Ritthaler is a 53 year old male postop day #1 from a right hand-assisted laparoscopic radical nephrectomy for 4 cm renal mass, recovering appropriately.  Foley removal today, must void prior to discharge Advance to general diet as tolerated Ambulate, continue p.o. pain control Dispo: Likely home today, follow-up 2 weeks for pathology discussion and wound check  Billey Co, MD

## 2018-07-23 NOTE — Telephone Encounter (Signed)
-----   Message from Billey Co, MD sent at 07/22/2018  5:11 PM EDT ----- Regarding: follow up Please schedule clinic follow up in 2 weeks to discuss pathology results  Nickolas Madrid, MD 07/22/2018

## 2018-07-24 LAB — CBC WITH DIFFERENTIAL/PLATELET
Abs Immature Granulocytes: 0.07 10*3/uL (ref 0.00–0.07)
Basophils Absolute: 0 10*3/uL (ref 0.0–0.1)
Basophils Relative: 0 %
Eosinophils Absolute: 0.2 10*3/uL (ref 0.0–0.5)
Eosinophils Relative: 1 %
HCT: 43.6 % (ref 39.0–52.0)
Hemoglobin: 13.9 g/dL (ref 13.0–17.0)
Immature Granulocytes: 1 %
Lymphocytes Relative: 7 %
Lymphs Abs: 0.8 10*3/uL (ref 0.7–4.0)
MCH: 30 pg (ref 26.0–34.0)
MCHC: 31.9 g/dL (ref 30.0–36.0)
MCV: 94.2 fL (ref 80.0–100.0)
Monocytes Absolute: 0.8 10*3/uL (ref 0.1–1.0)
Monocytes Relative: 7 %
Neutro Abs: 9.5 10*3/uL — ABNORMAL HIGH (ref 1.7–7.7)
Neutrophils Relative %: 84 %
Platelets: 231 10*3/uL (ref 150–400)
RBC: 4.63 MIL/uL (ref 4.22–5.81)
RDW: 13.4 % (ref 11.5–15.5)
WBC: 11.4 10*3/uL — ABNORMAL HIGH (ref 4.0–10.5)
nRBC: 0 % (ref 0.0–0.2)

## 2018-07-24 LAB — BASIC METABOLIC PANEL
Anion gap: 16 — ABNORMAL HIGH (ref 5–15)
BUN: 18 mg/dL (ref 6–20)
CO2: 12 mmol/L — ABNORMAL LOW (ref 22–32)
Calcium: 8.8 mg/dL — ABNORMAL LOW (ref 8.9–10.3)
Chloride: 103 mmol/L (ref 98–111)
Creatinine, Ser: 1.24 mg/dL (ref 0.61–1.24)
GFR calc Af Amer: >60 mL/min (ref >60)
GFR calc non Af Amer: >60 mL/min (ref >60)
Glucose, Bld: 195 mg/dL — ABNORMAL HIGH (ref 70–99)
Potassium: 4.6 mmol/L (ref 3.5–5.1)
Sodium: 131 mmol/L — ABNORMAL LOW (ref 135–145)

## 2018-07-24 LAB — GLUCOSE, CAPILLARY
Glucose-Capillary: 195 mg/dL — ABNORMAL HIGH (ref 70–99)
Glucose-Capillary: 217 mg/dL — ABNORMAL HIGH (ref 70–99)
Glucose-Capillary: 228 mg/dL — ABNORMAL HIGH (ref 70–99)

## 2018-07-24 MED ORDER — SODIUM CHLORIDE 0.9 % IV BOLUS
1000.0000 mL | Freq: Once | INTRAVENOUS | Status: AC
  Administered 2018-07-24: 1000 mL via INTRAVENOUS

## 2018-07-24 MED ORDER — LISINOPRIL 10 MG PO TABS
10.0000 mg | ORAL_TABLET | Freq: Every day | ORAL | Status: DC
  Administered 2018-07-24: 10 mg via ORAL
  Filled 2018-07-24 (×2): qty 1

## 2018-07-24 NOTE — Anesthesia Postprocedure Evaluation (Signed)
Anesthesia Post Note  Patient: Matthew Woodard  Procedure(s) Performed: HAND ASSISTED LAPAROSCOPIC NEPHRECTOMY (Right Abdomen) CYSTOSCOPY WITH STENT REMOVAL (Right Ureter)  Patient location during evaluation: PACU Anesthesia Type: General Level of consciousness: awake and alert Pain management: pain level controlled Vital Signs Assessment: post-procedure vital signs reviewed and stable Respiratory status: spontaneous breathing, nonlabored ventilation, respiratory function stable and patient connected to nasal cannula oxygen Cardiovascular status: blood pressure returned to baseline and stable Postop Assessment: no apparent nausea or vomiting Anesthetic complications: no     Last Vitals:  Vitals:   07/24/18 1402 07/24/18 1522  BP:    Pulse: (!) 120 100  Resp:    Temp:    SpO2: 96%     Last Pain:  Vitals:   07/24/18 1755  TempSrc:   PainSc: 3                  Martha Clan

## 2018-07-24 NOTE — Discharge Summary (Signed)
Physician Discharge Summary  Patient ID: Matthew Woodard MRN: [id number] DOB/AGE: 08/26/1965 53 y.o.  Admit date: 07/22/2018 Discharge date: 07/24/2018  Admission Diagnoses: Right renal mass  Discharge Diagnoses:  Active Problems:   Cancer of kidney [facility])   Discharged Condition: good  Hospital Course:  Patient was admitted for elective right laparoscopic hand-assisted radical nephrectomy for 4 cm renal mass.  On 07/22/2018 he underwent uncomplicated radical nephrectomy.  He had moderately controlled pain postoperatively and was kept for observation for 2 days for improved pain control.  On postop day 2 he had some mild tachycardia that resolved with fluid bolus and improved pain control.  He denied any chest pain or shortness of breath prior to discharge.  Pain control was significantly improved.  He was passing gas and tolerating a general diet and was discharged home in good condition.  Consults: None  Significant Diagnostic Studies: None  Treatments: Laparoscopic right hand-assisted radical nephrectomy  Discharge Exam: Blood pressure 139/71, pulse 100, temperature 98.4 F (36.9 C), temperature source Oral, resp. rate 20, height 6' (1.829 m), weight 103 kg, SpO2 96 %.  Alert no acute distress Abdomen soft, appropriately tender, nondistended Incisions clean dry and intact with Dermabond  Disposition: Discharge disposition: 01-Home or Self Care       Discharge Instructions    Discharge instructions   Complete by:  As directed    You underwent laparoscopic right kidney removal.  Okay to shower starting Wednesday, 07/24/2018.  Wash incisions gently with soap and water and pat dry.  Walking and stairs are okay.  No lifting more than 15 pounds for 4 weeks.  Eat small meals and drink plenty of fluids.   Call the urology clinic at [phone] if you have any questions or problems.  Call the clinic or go to the emergency room if you have fever over 101.3, severe abdominal pain, or  nausea and vomiting.  Follow-up in clinic in 2 weeks.      Signed: Billey Co 07/24/2018, 4:19 PM

## 2018-07-24 NOTE — Progress Notes (Signed)
I paged urologist on call about patient's increased BP and heart rate. Dr Bernardo Heater returned my page. He ordered lisinopril  10 mg now, Met B and CBC. Orders placed. Patient's pain was hard to control over night. Patient is belching and passing flatus.

## 2018-07-24 NOTE — Progress Notes (Signed)
   Subjective Afebrile, tolerating some general diet, + flatus, no BM Continues to have some issues with pain control, but ambulating with PO pain meds alone Voiding spontaneously Denies CP or SOB  Physical Exam: BP (!) 152/86 (BP Location: Right Arm)   Pulse (!) 130   Temp 98.8 F (37.1 C) (Oral)   Resp 20   Ht 6' (1.829 m)   Wt 103 kg   SpO2 95%   BMI 30.80 kg/m    Constitutional:  Alert and oriented, No acute distress. Respiratory: Normal respiratory effort, no increased work of breathing. GI: Abdomen is soft, appropriately tender, non-distended GU: No CVA tenderness  Laboratory Data: Reviewed  Assessment & Plan:   Matthew Woodard is a 53 year old male postop day #2 from a right hand-assisted laparoscopic radical nephrectomy for 4 cm renal mass, recovering appropriately.  General diet Ambulate x4, continue p.o. pain control, IS Dispo: Likely home today if pain control improved, follow-up 2 weeks for pathology discussion and wound check  Billey Co, MD

## 2018-07-24 NOTE — Progress Notes (Signed)
Patient's HR in the 120s sustaining. Dr Diamantina Providence made aware. This nurse received verbal orders for a 1 L NS bolus. Order placed. Will recheck HR after bolus is completed.

## 2018-07-25 LAB — SURGICAL PATHOLOGY

## 2018-07-30 ENCOUNTER — Telehealth: Payer: Self-pay | Admitting: Urology

## 2018-07-30 NOTE — Telephone Encounter (Signed)
Drink plenty of fluids, may just have a viral illness, does not sound surgery related. Should call us back if fever, abdominal pain, or N/V.  Thanks Nickolas Madrid, MD 07/30/2018

## 2018-07-30 NOTE — Telephone Encounter (Signed)
Pt called to let you know that he started feeling weak, tired and having severe diarrhea yesterday. He just wanted some advice. No fever. Please advise.

## 2018-08-01 ENCOUNTER — Other Ambulatory Visit: Payer: Self-pay | Admitting: Family Medicine

## 2018-08-01 DIAGNOSIS — E1169 Type 2 diabetes mellitus with other specified complication: Secondary | ICD-10-CM

## 2018-08-01 DIAGNOSIS — E669 Obesity, unspecified: Secondary | ICD-10-CM

## 2018-08-01 DIAGNOSIS — E871 Hypo-osmolality and hyponatremia: Secondary | ICD-10-CM

## 2018-08-01 NOTE — Telephone Encounter (Signed)
Patient notified, will try to come by [facility]

## 2018-08-01 NOTE — Telephone Encounter (Signed)
Last GFR >60 However, labs also showed hyponatremia, acidosis but this was while he was in the hospital after surgery I will stop the Jardiance and metformin for now until repeat labs can be done  [facility], Please check on patient, see how he's doing after surgery Please ask patient to get a BMP done as soon as convenient (I'll order) I looked at his labs from the hospital and want to make sure a few things have corrected before we resume two of his diabetes medicines Do NOT take Jardiance or metformin until we get those lab results back Stay well-hydrated

## 2018-08-05 ENCOUNTER — Other Ambulatory Visit: Payer: Self-pay

## 2018-08-05 ENCOUNTER — Ambulatory Visit (INDEPENDENT_AMBULATORY_CARE_PROVIDER_SITE_OTHER): Payer: BLUE CROSS/BLUE SHIELD | Admitting: Urology

## 2018-08-05 ENCOUNTER — Encounter: Payer: Self-pay | Admitting: Urology

## 2018-08-05 DIAGNOSIS — E871 Hypo-osmolality and hyponatremia: Secondary | ICD-10-CM

## 2018-08-05 DIAGNOSIS — E669 Obesity, unspecified: Secondary | ICD-10-CM

## 2018-08-05 DIAGNOSIS — C641 Malignant neoplasm of right kidney, except renal pelvis: Secondary | ICD-10-CM

## 2018-08-05 DIAGNOSIS — E1169 Type 2 diabetes mellitus with other specified complication: Secondary | ICD-10-CM

## 2018-08-05 NOTE — Progress Notes (Signed)
   08/05/2018 9:56 AM   Durene Fruits III [birth year] [id number]  Reason for visit: Follow up right radical nephrectomy  HPI: I saw Mr. Plouff back in urology clinic today for follow-up of right nephrectomy.  He is a 53 year old male who originally presented with hematuria, and on CT scan was found to have a 5 cm enhancing right renal mass that invaded the collecting system.  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  Margins were negative.  He is recovering well from surgery and is doing well.  He specifically denies any flank or abdominal pain.  He denies any shortness of breath or chest pain.  On exam, his incisions are healing appropriately and his abdomen is soft, non-tender, and non-distended.  We discussed his pathology results at length today.  We discussed the risk of recurrence and need for close surveillance.  We also discussed the importance of close diabetes and hypertension control in the setting of his now solitary kidney.  His renal function prior to discharge from the hospital showed a creatinine of 1.24, with eGFR greater than 60.  Regarding surveillance, we discussed the need for repeat imaging in 3 months with CT abdomen/pelvis and chest x-ray.  After that we will plan for every six-month imaging for the next 3 years, then yearly up to year 5.  RTC 3 months for CT A/P and CXR  A total of 15 minutes were spent face-to-face with the patient, greater than 50% was spent in patient education, counseling, and coordination of care regarding pathology results, renal cell carcinoma surveillance protocol, need for close diabetes and hypertension control.  Billey Co, [facility] [address], [place], [place] [address] [phone]

## 2018-08-06 LAB — BASIC METABOLIC PANEL WITH GFR
BUN: 20 mg/dL (ref 7–25)
CO2: 30 mmol/L (ref 20–32)
Calcium: 9.7 mg/dL (ref 8.6–10.3)
Chloride: 99 mmol/L (ref 98–110)
Creat: 1.04 mg/dL (ref 0.70–1.33)
GFR, Est African American: 95 mL/min/{1.73_m2} (ref >=60)
GFR, Est Non African American: 82 mL/min/{1.73_m2} (ref >=60)
Glucose, Bld: 212 mg/dL — ABNORMAL HIGH (ref 65–99)
Potassium: 4.6 mmol/L (ref 3.5–5.3)
Sodium: 135 mmol/L (ref 135–146)

## 2018-08-07 ENCOUNTER — Encounter: Payer: Self-pay | Admitting: Family Medicine

## 2018-08-14 ENCOUNTER — Encounter: Payer: Self-pay | Admitting: Family Medicine

## 2018-08-14 MED ORDER — EMPAGLIFLOZIN 10 MG PO TABS: 10.0000 mg | ORAL_TABLET | Freq: Every day | ORAL | 2 refills | Status: DC

## 2018-08-15 MED ORDER — METFORMIN HCL ER 500 MG PO TB24: ORAL_TABLET | ORAL | 0 refills | Status: DC

## 2018-08-15 NOTE — Addendum Note (Signed)
Addended by: [name], [name] on: 08/15/2018 08:28 AM   Modules accepted: Orders

## 2018-08-16 ENCOUNTER — Encounter: Payer: Self-pay | Admitting: Urology

## 2018-08-19 ENCOUNTER — Ambulatory Visit: Payer: BLUE CROSS/BLUE SHIELD | Admitting: Urology

## 2018-09-24 ENCOUNTER — Other Ambulatory Visit: Payer: Self-pay | Admitting: Family Medicine

## 2018-09-24 ENCOUNTER — Other Ambulatory Visit: Payer: Self-pay | Admitting: Nurse Practitioner

## 2018-09-24 MED ORDER — ROSUVASTATIN CALCIUM 10 MG PO TABS: 10.0000 mg | ORAL_TABLET | Freq: Every day | ORAL | 1 refills | Status: DC

## 2018-10-28 ENCOUNTER — Ambulatory Visit
Admission: RE | Admit: 2018-10-28 | Discharge: 2018-10-28 | Disposition: A | Discharge status: Home / Self Care | Payer: BC Managed Care – PPO | Source: Ambulatory Visit | Attending: Urology | Admitting: Urology

## 2018-10-28 ENCOUNTER — Other Ambulatory Visit: Payer: Self-pay

## 2018-10-28 DIAGNOSIS — C641 Malignant neoplasm of right kidney, except renal pelvis: Secondary | ICD-10-CM | POA: Insufficient documentation

## 2018-10-28 LAB — POCT I-STAT CREATININE: Creatinine, Ser: 1.1 mg/dL (ref 0.61–1.24)

## 2018-10-28 MED ORDER — IOHEXOL 300 MG/ML  SOLN
100.0000 mL | Freq: Once | INTRAMUSCULAR | Status: AC | PRN
  Administered 2018-10-28: 100 mL via INTRAVENOUS

## 2018-10-31 ENCOUNTER — Other Ambulatory Visit: Payer: Self-pay

## 2018-10-31 ENCOUNTER — Encounter: Payer: Self-pay | Admitting: Urology

## 2018-10-31 ENCOUNTER — Ambulatory Visit
Admission: RE | Admit: 2018-10-31 | Discharge: 2018-10-31 | Disposition: A | Discharge status: Home / Self Care | Payer: BC Managed Care – PPO | Source: Ambulatory Visit | Attending: Urology | Admitting: Urology

## 2018-10-31 ENCOUNTER — Ambulatory Visit (INDEPENDENT_AMBULATORY_CARE_PROVIDER_SITE_OTHER): Payer: BC Managed Care – PPO | Admitting: Urology

## 2018-10-31 VITALS — BP 144/73 | HR 87 | Ht 72.0 in | Wt 243.0 lb

## 2018-10-31 DIAGNOSIS — C641 Malignant neoplasm of right kidney, except renal pelvis: Secondary | ICD-10-CM

## 2018-10-31 NOTE — Progress Notes (Signed)
   10/31/2018 8:44 AM   Matthew Woodard [birth year] [id number]  Reason for visit: Follow up [place]   HPI: I saw Matthew Woodard back in urology clinic today for follow-up of right radical nephrectomy.  He is a 53 year old male who originally presented with hematuria, and on CT scan was found to have a 5 cm enhancing right renal mass that invaded the collecting system.  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  Margins were negative.  He has recovered well from surgery and is doing well.  He specifically denies any flank or abdominal pain.  He denies any shortness of breath or chest pain. He has very mild urinary symptoms of weak stream, but is not interested in medications for this yet.  On exam, his incisions are healing appropriately and his abdomen is soft, non-tender, and non-distended.  We discussed the risk of recurrence and need for close surveillance.  We also discussed the importance of close diabetes and hypertension control in the setting of his solitary kidney. Renal function normal currently with sCr 1.1.  I personally reviewed the CT A/P 10/28/2018 and CXR today, no evidence of recurrence.   Regarding surveillance, we discussed the need for ongoing imaging with CT abdomen/pelvis and chest x-ray.  We will plan for every six-month imaging for 3 years, then yearly up to year 5.  RTC 6 months for CT A/P and CXR, and BMP  A total of 15 minutes were spent face-to-face with the patient, greater than 50% was spent in patient education, counseling, and coordination of care regarding renal cell carcinoma, need for surveillance, and importance of close HTN and DM control regarding renal function.  Billey Co, [facility] [address] [place], [place] [address] [phone]

## 2018-11-01 ENCOUNTER — Telehealth: Payer: Self-pay

## 2018-11-01 NOTE — Telephone Encounter (Signed)
Called pt informed him of the information below. Pt gave verbal understanding.  

## 2018-11-01 NOTE — Telephone Encounter (Signed)
-----   Message from Billey Co, MD sent at 10/31/2018  4:04 PM EDT ----- CXR looks great, keep follow up in 6 months  Nickolas Madrid, MD 10/31/2018

## 2018-12-05 ENCOUNTER — Other Ambulatory Visit: Payer: Self-pay | Admitting: Family Medicine

## 2018-12-05 NOTE — Telephone Encounter (Signed)
Please call Mr. Matthew Woodard and ask that he make an appointment for routine follow up when able to do so.  I know he is dealing with a lot of health issues outside of our office, but we need to monitor his diabetes and other chronic problems.  Thanks!

## 2018-12-05 NOTE — Telephone Encounter (Signed)
Spoke with pt and he said that he would call us back and schedule appt

## 2019-04-28 ENCOUNTER — Telehealth: Payer: Self-pay | Admitting: Urology

## 2019-04-28 ENCOUNTER — Encounter: Payer: Self-pay | Admitting: Physician Assistant

## 2019-04-28 ENCOUNTER — Other Ambulatory Visit: Payer: Self-pay

## 2019-04-28 ENCOUNTER — Ambulatory Visit (INDEPENDENT_AMBULATORY_CARE_PROVIDER_SITE_OTHER): Payer: BC Managed Care – PPO | Admitting: Physician Assistant

## 2019-04-28 VITALS — BP 145/72 | HR 106 | Ht 71.0 in | Wt 244.0 lb

## 2019-04-28 DIAGNOSIS — R310 Gross hematuria: Secondary | ICD-10-CM

## 2019-04-28 NOTE — Telephone Encounter (Signed)
Pt states that he passed a huge amount of blood last night w/ blood clots. He states that this has not had this happen since his kidney removal. He would like a call back to discuss.

## 2019-04-28 NOTE — Progress Notes (Signed)
04/28/2019 4:23 PM   Matthew Woodard [birth year] [id number]  CC: Gross hematuria  HPI: Matthew Woodard is a 53 y.o. male who presents today for evaluation of gross hematuria.  PMH significant for stage pT3a clear cell RCC, s/p radical right nephrectomy with Dr. Diamantina Providence on 07/22/2018.  He is due for 49-month surveillance imaging this week.  Today, he reports an episode of gross hematuria that started last night and has since resolved.  He states it was associated with the passage of at least one clot.  He denies flank pain, fever, chills, nausea, vomiting, dysuria, urgency, and frequency.  He reports he is still able to empty his bladder.  He denies thick, ketchup-like urine.  Of note, he reports an approximate 80-pack-year smoking history.  He is not on anticoagulation.  In-office UA today positive for 3+ blood; urine microscopy with >30 RBCs/HPF.  PMH: Past Medical History:  Diagnosis Date  . Arthritis    hands, left knee  . Diabetes mellitus without complication ([place])   . Eczema 01/08/2017  . GERD (gastroesophageal reflux disease)   . Hyperlipidemia LDL goal <100 11/02/2014  . Hypertension   . Kidney stone    YEAR H/O HEMATURIA  . Psoriasis   . Wears dentures    full upper, partial lower.  Has, does not wear    Surgical History: Past Surgical History:  Procedure Laterality Date  . COLONOSCOPY WITH PROPOFOL N/A 02/23/2017   Procedure: COLONOSCOPY WITH PROPOFOL;  Surgeon: [name], MD;  Location: [place];  Service: Endoscopy;  Laterality: N/A;  Diabetic - oral meds  . CYST EXCISION  Aug. 2016   on back  . CYSTOSCOPY W/ RETROGRADES Right 06/28/2018   Procedure: CYSTOSCOPY WITH RETROGRADE PYELOGRAM;  Surgeon: Billey Co, MD;  Location: [facility];  Service: Urology;  Laterality: Right;  . CYSTOSCOPY W/ URETERAL STENT REMOVAL Right 07/22/2018   Procedure: CYSTOSCOPY WITH STENT REMOVAL;  Surgeon: Billey Co, MD;  Location: [facility];  Service:  Urology;  Laterality: Right;  . CYSTOSCOPY WITH BIOPSY Right 06/28/2018   Procedure: CYSTOSCOPY WITH RENAL BIOPSY;  Surgeon: Billey Co, MD;  Location: [facility];  Service: Urology;  Laterality: Right;  . CYSTOSCOPY WITH URETEROSCOPY AND STENT PLACEMENT Right 06/28/2018   Procedure: CYSTOSCOPY WITH URETEROSCOPY AND STENT PLACEMENT;  Surgeon: Billey Co, MD;  Location: [facility];  Service: Urology;  Laterality: Right;  . FULGURATION OF BLADDER TUMOR N/A 06/28/2018   Procedure: CYSTOSCOPY BLADDER BIOPSY, FULGERATION OF BLADDER;  Surgeon: Billey Co, MD;  Location: [facility];  Service: Urology;  Laterality: N/A;  . KNEE SURGERY    . LAPAROSCOPIC NEPHRECTOMY, HAND ASSISTED Right 07/22/2018   Procedure: HAND ASSISTED LAPAROSCOPIC NEPHRECTOMY;  Surgeon: Billey Co, MD;  Location: [facility];  Service: Urology;  Laterality: Right;  . NASAL SINUS SURGERY  03/15  . POLYPECTOMY N/A 02/23/2017   Procedure: POLYPECTOMY;  Surgeon: [name], MD;  Location: [place];  Service: Endoscopy;  Laterality: N/A;  . tooth abstraction    . VARICOCELE EXCISION      Home Medications:  Allergies as of 04/28/2019      Reactions   Glucotrol [glipizide] Other (See Comments)   Glucose level spiked then dropped      Medication List       Accurate as of April 28, 2019  4:23 PM. If you have any questions, ask your nurse or doctor.        STOP taking  these medications   docusate sodium 100 MG capsule Commonly known as: COLACE Stopped by: Debroah Loop, PA-C   HYDROcodone-acetaminophen 5-325 MG tablet Commonly known as: NORCO/VICODIN Stopped by: Debroah Loop, PA-C     TAKE these medications   ACCU-CHEK AVIVA PLUS VI by In Vitro route.   clobetasol cream 0.05 % Commonly known as: TEMOVATE Apply 1 application topically 2 (two) times daily. To affected skin; too strong for face, groin, underarms   Fish Oil 600 MG Caps Take 1 tablet by mouth daily.   folic  acid 1 MG tablet Commonly known as: FOLVITE TK 1 T PO D EXCEPT ON METHOTREXATE DAY   glucosamine-chondroitin 500-400 MG tablet Take 1 tablet by mouth daily.   Jardiance 10 MG Tabs tablet Generic drug: empagliflozin TAKE 1 TABLET BY MOUTH DAILY   lisinopril 10 MG tablet Commonly known as: ZESTRIL TAKE 1 TABLET(10 MG) BY MOUTH DAILY   meloxicam 15 MG tablet Commonly known as: MOBIC   metFORMIN 500 MG 24 hr tablet Commonly known as: GLUCOPHAGE-XR Take 3 pills by mouth daily for 4-5 days, then increase to 4 pills daily (if tolerated)   multivitamin tablet Take 1 tablet by mouth daily.   pioglitazone 45 MG tablet Commonly known as: ACTOS TAKE 1 TABLET(45 MG) BY MOUTH DAILY   PRILOSEC PO Take by mouth 2 (two) times daily as needed.   ranitidine 75 MG tablet Commonly known as: ZANTAC Take 75 mg by mouth 2 (two) times daily.   rosuvastatin 10 MG tablet Commonly known as: Crestor Take 1 tablet (10 mg total) by mouth at bedtime.       Allergies:  Allergies  Allergen Reactions  . Glucotrol [Glipizide] Other (See Comments)    Glucose level spiked then dropped    Family History: Family History  Problem Relation Age of Onset  . Heart disease Father   . Hypertension Father   . COPD Father   . Colon cancer Maternal Grandmother   . Heart attack Paternal Grandfather     Social History:   reports that he quit smoking about 5 years ago. His smoking use included cigarettes. He has never used smokeless tobacco. He reports that he does not drink alcohol or use drugs.  ROS: UROLOGY Frequent Urination?: No Hard to postpone urination?: No Burning/pain with urination?: No Get up at night to urinate?: No Leakage of urine?: No Urine stream starts and stops?: No Trouble starting stream?: No Do you have to strain to urinate?: No Blood in urine?: Yes Urinary tract infection?: No Sexually transmitted disease?: No Injury to kidneys or bladder?: No Painful intercourse?:  No Weak stream?: No Erection problems?: No Penile pain?: No  Gastrointestinal Nausea?: No Vomiting?: No Indigestion/heartburn?: No Diarrhea?: No Constipation?: No  Constitutional Fever: No Night sweats?: No Weight loss?: No Fatigue?: No  Skin Skin rash/lesions?: No Itching?: No  Eyes Blurred vision?: No Double vision?: No  Ears/Nose/Throat Sore throat?: No Sinus problems?: No  Hematologic/Lymphatic Swollen glands?: No Easy bruising?: No  Cardiovascular Leg swelling?: No Chest pain?: No  Respiratory Cough?: No Shortness of breath?: No  Endocrine Excessive thirst?: No  Musculoskeletal Back pain?: Yes Joint pain?: No  Neurological Headaches?: No Dizziness?: No  Psychologic Depression?: No Anxiety?: No  Physical Exam: BP (!) 145/72   Pulse (!) 106   Ht 5\' 11"  (1.803 m)   Wt 244 lb (110.7 kg)   BMI 34.03 kg/m   Constitutional:  Alert and oriented, no acute distress, nontoxic appearing HEENT: , AT Cardiovascular: No  clubbing, cyanosis, or edema Respiratory: Normal respiratory effort, no increased work of breathing GU: No CVA tenderness or flank ecchymosis Skin: No rashes, bruises or suspicious lesions Neurologic: Grossly intact, no focal deficits, moving all 4 extremities Psychiatric: Normal mood and affect  Laboratory Data: Results for orders placed or performed in visit on 04/28/19  Microscopic Examination   URINE  Result Value Ref Range   WBC, UA None seen 0 - 5 /hpf   RBC >30 (A) 0 - 2 /hpf   Epithelial Cells (non renal) None seen 0 - 10 /hpf   Bacteria, UA None seen None seen/Few  Urinalysis, Complete  Result Value Ref Range   Specific Gravity, UA 1.020 1.005 - 1.030   pH, UA 7.0 5.0 - 7.5   Color, UA Yellow Yellow   Appearance Ur Cloudy (A) Clear   Leukocytes,UA Negative Negative   Protein,UA Negative Negative/Trace   Glucose, UA 2+ (A) Negative   Ketones, UA Negative Negative   RBC, UA 3+ (A) Negative   Bilirubin, UA  Negative Negative   Urobilinogen, Ur 0.2 0.2 - 1.0 mg/dL   Nitrite, UA Negative Negative   Microscopic Examination See below:    Assessment & Plan:   1. Gross hematuria 53 year old male s/p right radical nephrectomy for management of pT3a clear-cell RCC with an approximate 80-pack-year smoking history presents with a 1 day history of gross hematuria.  UA reassuring for infection, will send for culture to confirm.  He is due for 2-month surveillance imaging this week.  He denies pain or other voiding symptoms.  Recommend proceeding with imaging ASAP.  Requested preauthorization today to get this scheduled.  If CT is negative, will order cystoscopy for further evaluation. - Urinalysis, Complete - CULTURE, URINE COMPREHENSIVE  Debroah Loop, PA-C  [facility] [address] [place], [place] [address] [phone]

## 2019-04-29 ENCOUNTER — Other Ambulatory Visit: Payer: Self-pay | Admitting: Family Medicine

## 2019-04-29 DIAGNOSIS — C641 Malignant neoplasm of right kidney, except renal pelvis: Secondary | ICD-10-CM

## 2019-04-29 LAB — URINALYSIS, COMPLETE
Bilirubin, UA: NEGATIVE
Ketones, UA: NEGATIVE
Leukocytes,UA: NEGATIVE
Nitrite, UA: NEGATIVE
Protein,UA: NEGATIVE
Specific Gravity, UA: 1.02 (ref 1.005–1.030)
Urobilinogen, Ur: 0.2 mg/dL (ref 0.2–1.0)
pH, UA: 7 (ref 5.0–7.5)

## 2019-04-29 LAB — MICROSCOPIC EXAMINATION
Bacteria, UA: NONE SEEN
Epithelial Cells (non renal): NONE SEEN /hpf (ref 0–10)
RBC: >30 /hpf — AB (ref 0–2)
WBC, UA: NONE SEEN /hpf (ref 0–5)

## 2019-04-30 ENCOUNTER — Other Ambulatory Visit: Payer: Self-pay

## 2019-04-30 ENCOUNTER — Ambulatory Visit
Admission: RE | Admit: 2019-04-30 | Discharge: 2019-04-30 | Disposition: A | Discharge status: Home / Self Care | Payer: BC Managed Care – PPO | Source: Ambulatory Visit | Attending: Urology | Admitting: Urology

## 2019-04-30 ENCOUNTER — Other Ambulatory Visit
Admission: RE | Admit: 2019-04-30 | Discharge: 2019-04-30 | Disposition: A | Discharge status: Home / Self Care | Payer: BC Managed Care – PPO | Source: Ambulatory Visit | Attending: Urology | Admitting: Urology

## 2019-04-30 DIAGNOSIS — C641 Malignant neoplasm of right kidney, except renal pelvis: Secondary | ICD-10-CM

## 2019-04-30 LAB — CREATININE, SERUM
Creatinine, Ser: 1.28 mg/dL — ABNORMAL HIGH (ref 0.61–1.24)
GFR calc Af Amer: >60 mL/min (ref >60)
GFR calc non Af Amer: >60 mL/min (ref >60)

## 2019-04-30 LAB — CULTURE, URINE COMPREHENSIVE

## 2019-04-30 MED ORDER — IOHEXOL 300 MG/ML  SOLN
100.0000 mL | Freq: Once | INTRAMUSCULAR | Status: AC | PRN
  Administered 2019-04-30: 100 mL via INTRAVENOUS

## 2019-05-01 ENCOUNTER — Telehealth: Payer: Self-pay | Admitting: Urology

## 2019-05-01 DIAGNOSIS — C641 Malignant neoplasm of right kidney, except renal pelvis: Secondary | ICD-10-CM

## 2019-05-01 NOTE — Telephone Encounter (Signed)
Pt would like a call back to discuss results from tests that were run yesterday.

## 2019-05-01 NOTE — Telephone Encounter (Signed)
Urology telephone note  Mr. Matthew Woodard is a 53 year old male who underwent a right laparoscopic hand-assisted radical nephrectomy on 07/22/2018 with final pathology showing clear-cell renal cell carcinoma grade 3, 5.3 cm in size with invasion of the renal vein as well as collecting system.  Margins were negative.  He recently developed gross hematuria, and he underwent his routine staging imaging.  His hematuria has improved over the last few days, and urine is faint pink currently.  He denies any pain or weight loss.  CT abdomen pelvis on 04/30/2019 shows a high attenuation mass at the right UVJ with extension to the bladder concerning for RCC recurrence versus primary urothelial cell carcinoma.  There are also some new very small basilar pulmonary nodules worrisome for metastatic disease.  I discussed these findings at length with the patient, and the need for transurethral resection of the bladder tumor(TURBT) for pathology and to manage his hematuria.  Next steps will depend on pathology results, but would anticipate referral to medical oncology for systemic treatment.  Schedule TURBT  Nickolas Madrid, MD 05/01/2019

## 2019-05-01 NOTE — Telephone Encounter (Signed)
We call call with results when we get them . Patient understands.

## 2019-05-02 ENCOUNTER — Other Ambulatory Visit: Payer: Self-pay | Admitting: Radiology

## 2019-05-02 DIAGNOSIS — R310 Gross hematuria: Secondary | ICD-10-CM

## 2019-05-02 DIAGNOSIS — D494 Neoplasm of unspecified behavior of bladder: Secondary | ICD-10-CM

## 2019-05-06 ENCOUNTER — Other Ambulatory Visit: Payer: Self-pay

## 2019-05-06 ENCOUNTER — Encounter
Admission: RE | Admit: 2019-05-06 | Discharge: 2019-05-06 | Disposition: A | Discharge status: Home / Self Care | Payer: BC Managed Care – PPO | Source: Ambulatory Visit | Attending: Urology | Admitting: Urology

## 2019-05-06 NOTE — Patient Instructions (Signed)
Your procedure is scheduled on: Monday May 12, 2019 Report to Day Surgery on the 2nd floor of the [facility]. To find out your arrival time, please call [phone] between 1PM - 3PM on: May 08, 2019  REMEMBER: Instructions that are not followed completely may result in serious medical risk, up to and including death; or upon the discretion of your surgeon and anesthesiologist your surgery may need to be rescheduled.  Do not eat food after midnight the night before surgery.  No gum chewing, lozengers or hard candies.  You may however, drink CLEAR liquids up to 2 hours before you are scheduled to arrive for your surgery. Do not drink anything within 2 hours of the start of your surgery.  Clear liquids include: - water   Do NOT drink anything that is not on this list.  Type 1 and Type 2 diabetics should only drink water.  No Alcohol for 24 hours before or after surgery.  No Smoking including e-cigarettes for 24 hours prior to surgery.  No chewable tobacco products for at least 6 hours prior to surgery.  No nicotine patches on the day of surgery.  On the morning of surgery brush your teeth with toothpaste and water, you may rinse your mouth with mouthwash if you wish. Do not swallow any toothpaste or mouthwash.  Notify your doctor if there is any change in your medical condition (cold, fever, infection).  Do not wear jewelry, make-up, hairpins, clips or nail polish.  Do not wear lotions, powders, or perfumes.   Do not shave 48 hours prior to surgery.   Contacts and dentures may not be worn into surgery.  Do not bring valuables to the hospital, including drivers license, insurance or credit cards.  [facility] is not responsible for any belongings or valuables.   TAKE THESE MEDICATIONS THE MORNING OF SURGERY: OMEPRAZOLE (take one the night before and one on the morning of surgery - helps to prevent nausea after surgery.)  DO NOT TAKE LISINOPRIL THE DAY OF  SURGERY.  TAKE SHOWER THE DAY OF SURGERY  Stop Metformin 2 days prior to surgery. LAST DAY 12/25  Follow recommendations from Cardiologist, Pulmonologist or PCP regarding stopping Aspirin  Stop Anti-inflammatories (NSAIDS) such as Advil, Aleve, Ibuprofen, Motrin, Naproxen, Naprosyn and Aspirin based products such as Excedrin, Goodys Powder, BC Powder AND MELOXICAM (May take Tylenol or Acetaminophen if needed.)  Stop ANY OVER THE COUNTER supplements until after surgery. STOP OMEGA 3 FISH OIL  (May continue Vitamin D, Vitamin B, and multivitamin.)  Wear comfortable clothing (specific to your surgery type) to the hospital.  Plan for stool softeners for home use.  If you are being discharged the day of surgery, you will not be allowed to drive home. You will need a responsible adult to drive you home and stay with you that night.   If you are taking public transportation, you will need to have a responsible adult with you. Please confirm with your physician that it is acceptable to use public transportation.   Please call [phone] if you have any questions about these instructions.

## 2019-05-07 ENCOUNTER — Encounter
Admission: RE | Admit: 2019-05-07 | Discharge: 2019-05-07 | Disposition: A | Discharge status: Home / Self Care | Payer: BC Managed Care – PPO | Source: Ambulatory Visit | Attending: Urology | Admitting: Urology

## 2019-05-07 DIAGNOSIS — Z20828 Contact with and (suspected) exposure to other viral communicable diseases: Secondary | ICD-10-CM | POA: Diagnosis not present

## 2019-05-07 DIAGNOSIS — I10 Essential (primary) hypertension: Secondary | ICD-10-CM

## 2019-05-07 DIAGNOSIS — Z01818 Encounter for other preprocedural examination: Secondary | ICD-10-CM | POA: Insufficient documentation

## 2019-05-07 LAB — CBC
HCT: 40.8 % (ref 39.0–52.0)
Hemoglobin: 13.5 g/dL (ref 13.0–17.0)
MCH: 31.4 pg (ref 26.0–34.0)
MCHC: 33.1 g/dL (ref 30.0–36.0)
MCV: 94.9 fL (ref 80.0–100.0)
Platelets: 217 K/uL (ref 150–400)
RBC: 4.3 MIL/uL (ref 4.22–5.81)
RDW: 12.7 % (ref 11.5–15.5)
WBC: 5.9 K/uL (ref 4.0–10.5)
nRBC: 0 % (ref 0.0–0.2)

## 2019-05-07 LAB — BASIC METABOLIC PANEL WITH GFR
Anion gap: 11 (ref 5–15)
BUN: 29 mg/dL — ABNORMAL HIGH (ref 6–20)
CO2: 25 mmol/L (ref 22–32)
Calcium: 9.4 mg/dL (ref 8.9–10.3)
Chloride: 99 mmol/L (ref 98–111)
Creatinine, Ser: 1.33 mg/dL — ABNORMAL HIGH (ref 0.61–1.24)
GFR calc Af Amer: >60 mL/min
GFR calc non Af Amer: >60 mL/min
Glucose, Bld: 314 mg/dL — ABNORMAL HIGH (ref 70–99)
Potassium: 4.5 mmol/L (ref 3.5–5.1)
Sodium: 135 mmol/L (ref 135–145)

## 2019-05-07 NOTE — Pre-Procedure Instructions (Addendum)
Contacted patient's PCP office as requested by Dr Ronelle Nigh to attempt to have PCP manage elevated glucose prior to surgery. Spoke with nurse Denett was advised patient was on oral meds at this time they would not be able to get patient in to see PCP at this late time. Notified Dr Andree Elk decision made to recheck glucose am of surgery and if too high would cancel at that time. Patient also aware.

## 2019-05-08 ENCOUNTER — Other Ambulatory Visit
Admission: RE | Admit: 2019-05-08 | Discharge: 2019-05-08 | Disposition: A | Discharge status: Home / Self Care | Payer: BC Managed Care – PPO | Source: Ambulatory Visit | Attending: Urology | Admitting: Urology

## 2019-05-08 ENCOUNTER — Other Ambulatory Visit: Payer: Self-pay

## 2019-05-08 DIAGNOSIS — Z01818 Encounter for other preprocedural examination: Secondary | ICD-10-CM | POA: Diagnosis not present

## 2019-05-08 LAB — SARS CORONAVIRUS 2 (TAT 6-24 HRS): SARS Coronavirus 2: NEGATIVE

## 2019-05-12 ENCOUNTER — Ambulatory Visit: Payer: BC Managed Care – PPO

## 2019-05-12 ENCOUNTER — Other Ambulatory Visit: Payer: Self-pay

## 2019-05-12 ENCOUNTER — Ambulatory Visit
Admission: RE | Admit: 2019-05-12 | Discharge: 2019-05-12 | Disposition: A | Discharge status: Home / Self Care | Payer: BC Managed Care – PPO | Attending: Urology | Admitting: Urology

## 2019-05-12 ENCOUNTER — Ambulatory Visit: Payer: BC Managed Care – PPO | Admitting: Anesthesiology

## 2019-05-12 ENCOUNTER — Encounter: Payer: Self-pay | Admitting: Urology

## 2019-05-12 ENCOUNTER — Encounter
Admission: RE | Disposition: A | Discharge status: Home / Self Care | Payer: Self-pay | Source: Home / Self Care | Attending: Urology

## 2019-05-12 DIAGNOSIS — R310 Gross hematuria: Secondary | ICD-10-CM

## 2019-05-12 DIAGNOSIS — D494 Neoplasm of unspecified behavior of bladder: Secondary | ICD-10-CM

## 2019-05-12 DIAGNOSIS — Z905 Acquired absence of kidney: Secondary | ICD-10-CM | POA: Diagnosis not present

## 2019-05-12 DIAGNOSIS — I10 Essential (primary) hypertension: Secondary | ICD-10-CM | POA: Diagnosis not present

## 2019-05-12 DIAGNOSIS — Z85528 Personal history of other malignant neoplasm of kidney: Secondary | ICD-10-CM

## 2019-05-12 DIAGNOSIS — C676 Malignant neoplasm of ureteric orifice: Secondary | ICD-10-CM | POA: Diagnosis present

## 2019-05-12 DIAGNOSIS — E119 Type 2 diabetes mellitus without complications: Secondary | ICD-10-CM | POA: Insufficient documentation

## 2019-05-12 DIAGNOSIS — M1990 Unspecified osteoarthritis, unspecified site: Secondary | ICD-10-CM | POA: Insufficient documentation

## 2019-05-12 HISTORY — PX: TRANSURETHRAL RESECTION OF BLADDER TUMOR: SHX2575

## 2019-05-12 LAB — GLUCOSE, CAPILLARY
Glucose-Capillary: 143 mg/dL — ABNORMAL HIGH (ref 70–99)
Glucose-Capillary: 119 mg/dL — ABNORMAL HIGH (ref 70–99)

## 2019-05-12 SURGERY — TURBT (TRANSURETHRAL RESECTION OF BLADDER TUMOR)
Anesthesia: General | Site: Bladder

## 2019-05-12 MED ORDER — SUCCINYLCHOLINE CHLORIDE 20 MG/ML IJ SOLN
INTRAMUSCULAR | Status: DC | PRN
  Administered 2019-05-12: 120 mg via INTRAVENOUS

## 2019-05-12 MED ORDER — CEFAZOLIN SODIUM-DEXTROSE 2-4 GM/100ML-% IV SOLN
2.0000 g | INTRAVENOUS | Status: AC
  Administered 2019-05-12: 2 g via INTRAVENOUS

## 2019-05-12 MED ORDER — FENTANYL CITRATE (PF) 100 MCG/2ML IJ SOLN
INTRAMUSCULAR | Status: AC
  Filled 2019-05-12: qty 2

## 2019-05-12 MED ORDER — ROCURONIUM BROMIDE 100 MG/10ML IV SOLN
INTRAVENOUS | Status: DC | PRN
  Administered 2019-05-12: 40 mg via INTRAVENOUS
  Administered 2019-05-12: 10 mg via INTRAVENOUS

## 2019-05-12 MED ORDER — CEFAZOLIN SODIUM-DEXTROSE 2-4 GM/100ML-% IV SOLN
INTRAVENOUS | Status: AC
  Filled 2019-05-12: qty 100

## 2019-05-12 MED ORDER — PROMETHAZINE HCL 25 MG/ML IJ SOLN: 6.2500 mg | INTRAMUSCULAR | Status: DC | PRN

## 2019-05-12 MED ORDER — SODIUM CHLORIDE 0.9 % IV SOLN: INTRAVENOUS | Status: DC

## 2019-05-12 MED ORDER — SUGAMMADEX SODIUM 200 MG/2ML IV SOLN
INTRAVENOUS | Status: DC | PRN
  Administered 2019-05-12: 217.8 mg via INTRAVENOUS

## 2019-05-12 MED ORDER — MIDAZOLAM HCL 2 MG/2ML IJ SOLN
INTRAMUSCULAR | Status: AC
  Filled 2019-05-12: qty 2

## 2019-05-12 MED ORDER — HYDROCODONE-ACETAMINOPHEN 5-325 MG PO TABS: 1.0000 | ORAL_TABLET | ORAL | 0 refills | Status: AC | PRN

## 2019-05-12 MED ORDER — ONDANSETRON HCL 4 MG/2ML IJ SOLN
INTRAMUSCULAR | Status: DC | PRN
  Administered 2019-05-12: 4 mg via INTRAVENOUS

## 2019-05-12 MED ORDER — FENTANYL CITRATE (PF) 100 MCG/2ML IJ SOLN: 25.0000 ug | INTRAMUSCULAR | Status: DC | PRN

## 2019-05-12 MED ORDER — DEXAMETHASONE SODIUM PHOSPHATE 10 MG/ML IJ SOLN
INTRAMUSCULAR | Status: DC | PRN
  Administered 2019-05-12: 10 mg via INTRAVENOUS

## 2019-05-12 MED ORDER — FENTANYL CITRATE (PF) 100 MCG/2ML IJ SOLN
INTRAMUSCULAR | Status: DC | PRN
  Administered 2019-05-12 (×2): 50 ug via INTRAVENOUS

## 2019-05-12 MED ORDER — PROPOFOL 10 MG/ML IV BOLUS
INTRAVENOUS | Status: DC | PRN
  Administered 2019-05-12: 200 mg via INTRAVENOUS

## 2019-05-12 MED ORDER — MIDAZOLAM HCL 2 MG/2ML IJ SOLN
INTRAMUSCULAR | Status: DC | PRN
  Administered 2019-05-12: 2 mg via INTRAVENOUS

## 2019-05-12 MED ORDER — LIDOCAINE HCL (CARDIAC) PF 100 MG/5ML IV SOSY
PREFILLED_SYRINGE | INTRAVENOUS | Status: DC | PRN
  Administered 2019-05-12: 100 mg via INTRAVENOUS

## 2019-05-12 SURGICAL SUPPLY — 28 items
BAG DRAIN CYSTO-URO LG1000N (MISCELLANEOUS) ×2 IMPLANT
BAG DRN RND TRDRP ANRFLXCHMBR (UROLOGICAL SUPPLIES) ×1
BAG URINE DRAIN 2000ML AR STRL (UROLOGICAL SUPPLIES) ×2 IMPLANT
BRUSH SCRUB EZ  4% CHG (MISCELLANEOUS) ×1
BRUSH SCRUB EZ 4% CHG (MISCELLANEOUS) ×1 IMPLANT
CATH FOL 2WAY LX 18X30 (CATHETERS) ×2 IMPLANT
CRADLE LAMINECT ARM (MISCELLANEOUS) ×2 IMPLANT
DRAPE UTILITY 15X26 TOWEL STRL (DRAPES) ×2 IMPLANT
DRSG TELFA 4X3 1S NADH ST (GAUZE/BANDAGES/DRESSINGS) ×2 IMPLANT
ELECT LOOP 22F BIPOLAR SML (ELECTROSURGICAL)
ELECT REM PT RETURN 9FT ADLT (ELECTROSURGICAL)
ELECTRODE LOOP 22F BIPOLAR SML (ELECTROSURGICAL) IMPLANT
ELECTRODE REM PT RTRN 9FT ADLT (ELECTROSURGICAL) IMPLANT
GLOVE BIOGEL PI IND STRL 7.5 (GLOVE) ×1 IMPLANT
GLOVE BIOGEL PI INDICATOR 7.5 (GLOVE) ×1
GOWN STRL REUS W/ TWL LRG LVL3 (GOWN DISPOSABLE) ×1 IMPLANT
GOWN STRL REUS W/ TWL XL LVL3 (GOWN DISPOSABLE) ×1 IMPLANT
GOWN STRL REUS W/TWL LRG LVL3 (GOWN DISPOSABLE) ×2
GOWN STRL REUS W/TWL XL LVL3 (GOWN DISPOSABLE) ×2
KIT TURNOVER CYSTO (KITS) ×2 IMPLANT
LOOP CUT BIPOLAR 24F LRG (ELECTROSURGICAL) ×1 IMPLANT
NDL SAFETY ECLIPSE 18X1.5 (NEEDLE) ×1 IMPLANT
NEEDLE HYPO 18GX1.5 SHARP (NEEDLE) ×2
PACK CYSTO AR (MISCELLANEOUS) ×2 IMPLANT
SET IRRIG Y TYPE TUR BLADDER L (SET/KITS/TRAYS/PACK) ×2 IMPLANT
SURGILUBE 2OZ TUBE FLIPTOP (MISCELLANEOUS) ×2 IMPLANT
SYRINGE IRR TOOMEY STRL 70CC (SYRINGE) ×2 IMPLANT
WATER STERILE IRR 1000ML POUR (IV SOLUTION) ×2 IMPLANT

## 2019-05-12 NOTE — Transfer of Care (Signed)
Immediate Anesthesia Transfer of Care Note  Patient: Matthew Woodard  Procedure(s) Performed: TRANSURETHRAL RESECTION OF BLADDER TUMOR (TURBT) (N/A Bladder)  Patient Location: PACU  Anesthesia Type:General  Level of Consciousness: awake, alert  and oriented  Airway & Oxygen Therapy: Patient Spontanous Breathing and Patient connected to face mask oxygen  Post-op Assessment: Report given to RN and Post -op Vital signs reviewed and stable  Post vital signs: Reviewed and stable  Last Vitals:  Vitals Value Taken Time  BP 173/95 05/12/19 1623  Temp 35.9 C 05/12/19 1623  Pulse 89 05/12/19 1631  Resp 20 05/12/19 1631  SpO2 100 % 05/12/19 1631  Vitals shown include unvalidated device data.  Last Pain:  Vitals:   05/12/19 1623  TempSrc:   PainSc: Asleep         Complications: No apparent anesthesia complications

## 2019-05-12 NOTE — Anesthesia Post-op Follow-up Note (Signed)
Anesthesia QCDR form completed.        

## 2019-05-12 NOTE — H&P (Signed)
UROLOGY H&P UPDATE  Agree with prior H&P dated 04/28/2019.  Briefly, he is a 53 year old male with history notable for right hand-assisted laparoscopic nephrectomy on 07/22/2018 with pathology showing clear-cell renal cell carcinoma grade 3, 5.3 cm in size, final staging pT3a with invasion of the renal vein, segmental branches, collecting system, and perirenal sinus/fat.  Initial follow-up surveillance imaging on 10/28/2018 with CT abdomen pelvis with contrast and chest x-ray showed no evidence of recurrence.  He recently developed 2 weeks of gross hematuria and CT abdomen pelvis with contrast on 04/30/2019 showed a high attenuation mass in the bladder near the right UVJ that was worrisome for RCC recurrence versus new primary urothelial cell carcinoma.  There were also small basilar pulmonary nodules worrisome for metastatic disease.  Cardiac: RRR Lungs: CTA bilaterally  Laterality: N/A Procedure: Transurethral resection of bladder tumor  Urine: Urine culture 12/14 no growth  We discussed transurethral resection of bladder tumor (TURBT) and risks and benefits at length. This is typically a 1 to 2-hour procedure done under general anesthesia in the operating room.  A scope is inserted through the urethra and used to resect abnormal tissue within the bladder, which is then sent to the pathologist to determine grade and stage of the tumor.  Risks include bleeding, infection, need for temporary Foley placement, and bladder perforation.    We discussed next steps in treatment depend on pathology and staging, though with his history and small nodules seen at the base of the lungs on CT I was very honest with the patient that I am concerned for recurrence of his RCC with new metastatic disease.    Billey Co, MD 05/12/2019

## 2019-05-12 NOTE — Discharge Instructions (Signed)

## 2019-05-12 NOTE — Anesthesia Preprocedure Evaluation (Signed)
Anesthesia Evaluation  Patient identified by MRN, date of birth, ID band Patient awake    Reviewed: Allergy & Precautions, H&P , NPO status , reviewed documented beta blocker date and time   History of Anesthesia Complications Negative for: history of anesthetic complications  Airway Mallampati: I  TM Distance: >3 FB Neck ROM: full    Dental  (+) Upper Dentures, Partial Lower, Edentulous Upper, Missing, Dental Advidsory Given   Pulmonary neg pulmonary ROS, former smoker,           Cardiovascular Exercise Tolerance: Good hypertension, (-) angina(-) CAD, (-) Past MI, (-) Cardiac Stents and (-) CABG (-) dysrhythmias (-) Valvular Problems/Murmurs     Neuro/Psych negative neurological ROS  negative psych ROS   GI/Hepatic Neg liver ROS, GERD  Controlled,  Endo/Other  diabetes  Renal/GU Renal disease     Musculoskeletal  (+) Arthritis ,   Abdominal   Peds  Hematology   Anesthesia Other Findings Past Medical History: No date: Arthritis     Comment:  hands, left knee No date: Diabetes mellitus without complication ([place]) 99991111: Eczema No date: GERD (gastroesophageal reflux disease) 11/02/2014: Hyperlipidemia LDL goal <100 No date: Hypertension No date: Kidney stone     Comment:  YEAR H/O HEMATURIA No date: Psoriasis No date: Wears dentures     Comment:  full upper, partial lower.  Has, does not wear Past Surgical History: 02/23/2017: COLONOSCOPY WITH PROPOFOL; N/A     Comment:  Procedure: COLONOSCOPY WITH PROPOFOL;  Surgeon: [name], MD;  Location: [facility];  Service:               Endoscopy;  Laterality: N/A;  Diabetic - oral meds Aug. 2016: CYST EXCISION     Comment:  on back No date: KNEE SURGERY 03/15: NASAL SINUS SURGERY 02/23/2017: POLYPECTOMY; N/A     Comment:  Procedure: POLYPECTOMY;  Surgeon: [name], MD;                Location: [facility];  Service:  Endoscopy;                Laterality: N/A; No date: tooth abstraction No date: VARICOCELE EXCISION   Reproductive/Obstetrics                             Anesthesia Physical  Anesthesia Plan  ASA: III  Anesthesia Plan: General   Post-op Pain Management:    Induction: Intravenous  PONV Risk Score and Plan: 3 and Ondansetron, Treatment may vary due to age or medical condition, Midazolam, Dexamethasone and Promethazine  Airway Management Planned: Oral ETT  Additional Equipment:   Intra-op Plan:   Post-operative Plan: Extubation in OR  Informed Consent: I have reviewed the patients History and Physical, chart, labs and discussed the procedure including the risks, benefits and alternatives for the proposed anesthesia with the patient or authorized representative who has indicated his/her understanding and acceptance.     Dental Advisory Given  Plan Discussed with: CRNA  Anesthesia Plan Comments:         Anesthesia Quick Evaluation

## 2019-05-12 NOTE — Anesthesia Postprocedure Evaluation (Signed)
Anesthesia Post Note  Patient: Matthew Woodard  Procedure(s) Performed: TRANSURETHRAL RESECTION OF BLADDER TUMOR (TURBT) (N/A Bladder)  Patient location during evaluation: PACU Anesthesia Type: General Level of consciousness: awake and alert Pain management: pain level controlled Vital Signs Assessment: post-procedure vital signs reviewed and stable Respiratory status: spontaneous breathing, nonlabored ventilation, respiratory function stable and patient connected to nasal cannula oxygen Cardiovascular status: blood pressure returned to baseline and stable Postop Assessment: no apparent nausea or vomiting Anesthetic complications: no     Last Vitals:  Vitals:   05/12/19 1709 05/12/19 1728  BP: (!) 143/76 136/82  Pulse: 80 89  Resp: 14 16  Temp: (!) 36.1 C   SpO2: 98% 99%    Last Pain:  Vitals:   05/12/19 1728  TempSrc:   PainSc: 0-No pain                 Jyl Chico S

## 2019-05-12 NOTE — Op Note (Signed)
Date of procedure: 05/12/19  Preoperative diagnosis:  1. Bladder mass, concern for recurrence of renal cell carcinoma  Postoperative diagnosis:  1. Same  Procedure: 1. Transurethral resection of bladder tumor, 3 cm  Surgeon: Nickolas Madrid, MD  Anesthesia: General  Complications: None  Intraoperative findings:  1.  Left ureteral orifice identified and effluxing yellow urine 2.  Large 3 cm irregular spherical mass appeared to emanate from the right ureteral orifice and was grossly concerning for malignancy and very vascular 3.  Complete resection of tumor, at conclusion of case tissue appeared to almost involute suggesting the mass emanated from the right ureteral remnant 4.  Excellent hemostasis  EBL: 10 mL  Specimens: Bladder tumor for permanent  Drains: None  Indication: Matthew Woodard is a 53 y.o. patient with history of right laparoscopic radical nephrectomy in March 2020 for clear cell renal cell carcinoma with final pathology showing pT3a disease with invasion into the renal vein, sinus fat, and collecting system.    He presented with gross hematuria and CT was concerning for a high attenuation lesion within the bladder concerning for recurrence of RCC, as well as some small pulmonary nodules worrisome for metastatic disease.  After reviewing the management options for treatment, they elected to proceed with the above surgical procedure(s). We have discussed the potential benefits and risks of the procedure, side effects of the proposed treatment, the likelihood of the patient achieving the goals of the procedure, and any potential problems that might occur during the procedure or recuperation. Informed consent has been obtained.  Description of procedure:  The patient was taken to the operating room and general anesthesia was induced. SCDs were placed for DVT prophylaxis.. The patient was placed in the dorsal lithotomy position, prepped and draped in the usual sterile  fashion, and preoperative antibiotics(Ancef) were administered. A preoperative time-out was performed.   [name] sounds were used to dilate the urethra to 28 [place].  A 26 French resectoscope was used to intubate the urethra and a normal-appearing urethra was followed proximally in the bladder.  The left ureteral orifice was orthotopic and effluxing yellow urine.  There was a large 3 cm spherical irregular appearing mass emanating from where I would anticipate the right ureteral orifice was.  There was no identifiable right ureteral orifice.  There was also some old clot that was irrigated free from the bladder.  There were no other abnormal lesions within the bladder.  The resecting loop was used to methodically remove all abnormal appearing tissue.  The tumor was quite vascular, and was worrisome for recurrence of his renal cell carcinoma.  At the conclusion of the case, the tissue appeared to almost involute suggesting it emanated from the distal ureteral remnant.  The bipolar button was used to achieve meticulous hemostasis in the bladder, and there was excellent hemostasis with the bladder decompressed.  Disposition: Stable to PACU  Plan: Extubated and stable to PACU Follow-up in 1 week to discuss pathology results  Nickolas Madrid, MD

## 2019-05-12 NOTE — Anesthesia Procedure Notes (Signed)
Procedure Name: Intubation Date/Time: 05/12/2019 3:42 PM Performed by: Nelda Marseille, CRNA Pre-anesthesia Checklist: Patient identified, Patient being monitored, Timeout performed, Emergency Drugs available and Suction available Patient Re-evaluated:Patient Re-evaluated prior to induction Oxygen Delivery Method: Circle system utilized Preoxygenation: Pre-oxygenation with 100% oxygen Induction Type: IV induction Ventilation: Mask ventilation without difficulty Laryngoscope Size: Mac, 3 and McGraph Grade View: Grade I Tube type: Oral Tube size: 7.0 mm Number of attempts: 1 Airway Equipment and Method: Stylet Placement Confirmation: ETT inserted through vocal cords under direct vision,  positive ETCO2 and breath sounds checked- equal and bilateral Secured at: 22 cm Tube secured with: Tape Dental Injury: Teeth and Oropharynx as per pre-operative assessment  Difficulty Due To: Difficulty was anticipated, Difficult Airway- due to anterior larynx and Difficult Airway- due to large tongue

## 2019-05-14 ENCOUNTER — Encounter: Payer: Self-pay | Admitting: *Deleted

## 2019-05-14 ENCOUNTER — Telehealth: Payer: Self-pay | Admitting: Urology

## 2019-05-14 ENCOUNTER — Other Ambulatory Visit: Payer: Self-pay | Admitting: Urology

## 2019-05-14 DIAGNOSIS — C641 Malignant neoplasm of right kidney, except renal pelvis: Secondary | ICD-10-CM | POA: Insufficient documentation

## 2019-05-14 LAB — SURGICAL PATHOLOGY

## 2019-05-14 NOTE — Telephone Encounter (Signed)
Pt called and requests that a work note be emailed to his work to go back on 05/18/2018.  Email @ work is [email or web address]

## 2019-05-14 NOTE — Telephone Encounter (Signed)
Urology telephone note  I called Mr. Babiarz and his wife today with his pathology results.  Briefly, hes a 53 yo M that underwent a laparoscopic right radical nephrectomy with me in 07/22/2018 for a 5cm ccRCC stage pT3a with invasion into the collecting system.   Unfortunately, he presented with hematuria in December and CT 04/30/19 showed a 3cm bladder mass and new small lung nodules <77mm worrisome for recurrence of ccRCC.  He underwent TURBT on 05/12/2019 with resection of the bladder mass, and pathology showed recurrence of ccRCC.  I discussed these findings at length with the patient and his wife, and the need for systemic treatment for his newly metastatic ccRCC.  Urgent referral to oncology placed for likely Ipi/Nivo, will defer further staging imaging to them.  Follow-up with oncology ASAP for systemic treatment RTC with urology 4 months  Nickolas Madrid, MD 05/14/2019

## 2019-05-14 NOTE — Telephone Encounter (Signed)
Return to work 05/19/2019

## 2019-05-15 ENCOUNTER — Other Ambulatory Visit: Payer: Self-pay

## 2019-05-15 ENCOUNTER — Encounter: Payer: Self-pay | Admitting: Oncology

## 2019-05-15 NOTE — Progress Notes (Signed)
Patient referred by Dr. Vanetta Shawl for renal cell carcinoma of right kidney. Pt was introduced to cancer center services and given the opportunity to ask questions prior to visit. Pt prescreened for appointment.    Pt states that he was diagnosed with kidney cancer in feb 2020 and kidney was removed in march 2020. He went back follow up and was told that there was a spot on his bladder, which he had removed this past monday (12/28). No other concerns voiced.

## 2019-05-19 ENCOUNTER — Inpatient Hospital Stay: Payer: BC Managed Care – PPO

## 2019-05-19 ENCOUNTER — Other Ambulatory Visit: Payer: Self-pay

## 2019-05-19 ENCOUNTER — Inpatient Hospital Stay: Payer: BC Managed Care – PPO | Attending: Oncology | Admitting: Oncology

## 2019-05-19 ENCOUNTER — Telehealth: Payer: Self-pay

## 2019-05-19 VITALS — BP 161/95 | HR 92 | Temp 97.6°F | Resp 18 | Ht 71.0 in | Wt 244.0 lb

## 2019-05-19 DIAGNOSIS — Z87891 Personal history of nicotine dependence: Secondary | ICD-10-CM | POA: Insufficient documentation

## 2019-05-19 DIAGNOSIS — Z7984 Long term (current) use of oral hypoglycemic drugs: Secondary | ICD-10-CM | POA: Diagnosis not present

## 2019-05-19 DIAGNOSIS — Z79899 Other long term (current) drug therapy: Secondary | ICD-10-CM | POA: Insufficient documentation

## 2019-05-19 DIAGNOSIS — L409 Psoriasis, unspecified: Secondary | ICD-10-CM | POA: Diagnosis not present

## 2019-05-19 DIAGNOSIS — C641 Malignant neoplasm of right kidney, except renal pelvis: Secondary | ICD-10-CM

## 2019-05-19 DIAGNOSIS — Z8249 Family history of ischemic heart disease and other diseases of the circulatory system: Secondary | ICD-10-CM | POA: Insufficient documentation

## 2019-05-19 DIAGNOSIS — R918 Other nonspecific abnormal finding of lung field: Secondary | ICD-10-CM

## 2019-05-19 DIAGNOSIS — C7911 Secondary malignant neoplasm of bladder: Secondary | ICD-10-CM | POA: Diagnosis not present

## 2019-05-19 DIAGNOSIS — E119 Type 2 diabetes mellitus without complications: Secondary | ICD-10-CM | POA: Diagnosis not present

## 2019-05-19 DIAGNOSIS — I10 Essential (primary) hypertension: Secondary | ICD-10-CM | POA: Insufficient documentation

## 2019-05-19 DIAGNOSIS — C7800 Secondary malignant neoplasm of unspecified lung: Secondary | ICD-10-CM | POA: Insufficient documentation

## 2019-05-19 DIAGNOSIS — Z800 Family history of malignant neoplasm of digestive organs: Secondary | ICD-10-CM | POA: Diagnosis not present

## 2019-05-19 DIAGNOSIS — K219 Gastro-esophageal reflux disease without esophagitis: Secondary | ICD-10-CM | POA: Diagnosis not present

## 2019-05-19 DIAGNOSIS — Z791 Long term (current) use of non-steroidal anti-inflammatories (NSAID): Secondary | ICD-10-CM | POA: Diagnosis not present

## 2019-05-19 DIAGNOSIS — Z905 Acquired absence of kidney: Secondary | ICD-10-CM | POA: Insufficient documentation

## 2019-05-19 DIAGNOSIS — Z7189 Other specified counseling: Secondary | ICD-10-CM | POA: Diagnosis not present

## 2019-05-19 DIAGNOSIS — Z5112 Encounter for antineoplastic immunotherapy: Secondary | ICD-10-CM | POA: Insufficient documentation

## 2019-05-19 DIAGNOSIS — E785 Hyperlipidemia, unspecified: Secondary | ICD-10-CM | POA: Diagnosis not present

## 2019-05-19 LAB — COMPREHENSIVE METABOLIC PANEL
ALT: 19 U/L (ref 0–44)
AST: 16 U/L (ref 15–41)
Albumin: 4.3 g/dL (ref 3.5–5.0)
Alkaline Phosphatase: 68 U/L (ref 38–126)
Anion gap: 9 (ref 5–15)
BUN: 26 mg/dL — ABNORMAL HIGH (ref 6–20)
CO2: 27 mmol/L (ref 22–32)
Calcium: 9.4 mg/dL (ref 8.9–10.3)
Chloride: 98 mmol/L (ref 98–111)
Creatinine, Ser: 1.37 mg/dL — ABNORMAL HIGH (ref 0.61–1.24)
GFR calc Af Amer: >60 mL/min (ref >60)
GFR calc non Af Amer: 58 mL/min — ABNORMAL LOW (ref >60)
Glucose, Bld: 320 mg/dL — ABNORMAL HIGH (ref 70–99)
Potassium: 4.5 mmol/L (ref 3.5–5.1)
Sodium: 134 mmol/L — ABNORMAL LOW (ref 135–145)
Total Bilirubin: 0.5 mg/dL (ref 0.3–1.2)
Total Protein: 7.6 g/dL (ref 6.5–8.1)

## 2019-05-19 LAB — CBC WITH DIFFERENTIAL/PLATELET
Abs Immature Granulocytes: 0.05 10*3/uL (ref 0.00–0.07)
Basophils Absolute: 0 10*3/uL (ref 0.0–0.1)
Basophils Relative: 1 %
Eosinophils Absolute: 0.1 10*3/uL (ref 0.0–0.5)
Eosinophils Relative: 1 %
HCT: 42.7 % (ref 39.0–52.0)
Hemoglobin: 13.9 g/dL (ref 13.0–17.0)
Immature Granulocytes: 1 %
Lymphocytes Relative: 28 %
Lymphs Abs: 2.1 10*3/uL (ref 0.7–4.0)
MCH: 30.8 pg (ref 26.0–34.0)
MCHC: 32.6 g/dL (ref 30.0–36.0)
MCV: 94.7 fL (ref 80.0–100.0)
Monocytes Absolute: 0.6 10*3/uL (ref 0.1–1.0)
Monocytes Relative: 9 %
Neutro Abs: 4.5 10*3/uL (ref 1.7–7.7)
Neutrophils Relative %: 60 %
Platelets: 240 10*3/uL (ref 150–400)
RBC: 4.51 MIL/uL (ref 4.22–5.81)
RDW: 12.9 % (ref 11.5–15.5)
WBC: 7.4 10*3/uL (ref 4.0–10.5)
nRBC: 0 % (ref 0.0–0.2)

## 2019-05-19 LAB — LACTATE DEHYDROGENASE: LDH: 127 U/L (ref 98–192)

## 2019-05-19 MED ORDER — AXITINIB 5 MG PO TABS: 5.0000 mg | ORAL_TABLET | Freq: Two times a day (BID) | ORAL | 0 refills | Status: DC

## 2019-05-19 NOTE — Telephone Encounter (Signed)
[employer] with PDL testing submitted online order [phone]

## 2019-05-19 NOTE — Progress Notes (Signed)
Hematology/Oncology Consult note [facility] Telephone:([phone] [phone]   Patient Care Team: [name], NP as PCP - General [name], [name], MD as PCP - Family Medicine (Family Medicine) [name], MD as Consulting Physician (General Surgery) [name], [name], MD as Attending Physician (Family Medicine) [name], [name], MD as Consulting Physician (Dermatology)  REFERRING PROVIDER: Billey Co, MD  CHIEF COMPLAINTS/REASON FOR VISIT:  Evaluation of kidney cancer  HISTORY OF PRESENTING ILLNESS:   Matthew Woodard is a  54 y.o.  male with PMH listed below was seen in consultation at the request of  Billey Co, MD  for evaluation of kidney cancer Patient's cancer history dated back to February 2020 when he developed gross hematuria with small clots and right-sided flank pain.  Patient had a CT urogram done which showed right 4 cm central enhancing renal mass with invasion into the collecting system.  X-ray of chest was performed to see complete staging which showed no concerning findings. 06/28/2018 patient underwent a cystoscopy, bladder biopsy and a right retrograde pyelogram with intraoperative interpretation, right diagnostic ureteroscopy, right renal pelvis biopsy, right ureteral stent placement.  biopsy showed atypical cell clusters, with extensive crush artifact, nondiagnostic.   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  Surgical margins negative for tumor.pT3a,Nx Patient has been on surveillance after surgery. 10/28/2018 CT abdomen pelvis with contrast showed minimal fluid and or postoperative changes within the right renal fossa.  No evidence of metastatic disease or recurrence.  Patient has left kidney lesion previously characterized as  nonenhancing cyst by multi phasic contrast-enhanced CT. Patient reports an episode of gross hematuria in early December 2020, patient is due for 26-month surveillance CT scan. 04/30/2019 CT abdomen pelvis with contrast showed high attenuation mass at the right uretero vesicle junction, with extension into the bladder.  Bilateral pulmonary nodules, most indicated for metastatic disease.   Patient underwent cystoscopy and transurethral resection of irregular bladder tumor 3 cm.  Mass appeared to emanate from the right ureteral orifice.  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 Patient was referred to me for further work-up and management.  #History of tobacco abuse, former smoker.  Quit in 2015.  He denies any hemoptysis, chest pain, shortness of breath today. Patient reports that hematuria has completely resolved.  Denies any pain today. He is accompanied by his wife.  #History of cutaneous psoriasis, mostly on his right hand, currently on weekly methotrexate 12.5 mg with good symptom control. Review of Systems  Constitutional: Negative for appetite change, chills, fatigue, fever and unexpected weight change.  HENT:   Negative for hearing loss and voice change.   Eyes: Negative for eye problems and icterus.  Respiratory: Negative for chest tightness, cough and shortness of breath.   Cardiovascular: Negative for chest pain and leg swelling.  Gastrointestinal: Negative for abdominal distention and abdominal pain.  Endocrine: Negative for hot flashes.  Genitourinary: Negative for difficulty urinating, dysuria and frequency.        See HPI  Musculoskeletal: Negative for arthralgias.  Skin: Negative for itching and rash.  Neurological: Negative for light-headedness and numbness.  Hematological: Negative for adenopathy. Does not bruise/bleed easily.  Psychiatric/Behavioral: Negative for confusion.    MEDICAL HISTORY:  Past Medical History:  Diagnosis Date    . Arthritis    hands, left  knee  . Diabetes mellitus without complication ([place])   . Eczema 01/08/2017  . GERD (gastroesophageal reflux disease)   . History of kidney cancer   . Hyperlipidemia LDL goal <100 11/02/2014  . Hypertension   . Kidney stone    YEAR H/O HEMATURIA  . Psoriasis   . Wears dentures    full upper, partial lower.  Has, does not wear    SURGICAL HISTORY: Past Surgical History:  Procedure Laterality Date  . COLONOSCOPY WITH PROPOFOL N/A 02/23/2017   Procedure: COLONOSCOPY WITH PROPOFOL;  Surgeon: [name], MD;  Location: [facility];  Service: Endoscopy;  Laterality: N/A;  Diabetic - oral meds  . CYST EXCISION  Aug. 2016   on back  . CYSTOSCOPY W/ RETROGRADES Right 06/28/2018   Procedure: CYSTOSCOPY WITH RETROGRADE PYELOGRAM;  Surgeon: Billey Co, MD;  Location: [facility];  Service: Urology;  Laterality: Right;  . CYSTOSCOPY W/ URETERAL STENT REMOVAL Right 07/22/2018   Procedure: CYSTOSCOPY WITH STENT REMOVAL;  Surgeon: Billey Co, MD;  Location: [facility];  Service: Urology;  Laterality: Right;  . CYSTOSCOPY WITH BIOPSY Right 06/28/2018   Procedure: CYSTOSCOPY WITH RENAL BIOPSY;  Surgeon: Billey Co, MD;  Location: [facility];  Service: Urology;  Laterality: Right;  . CYSTOSCOPY WITH URETEROSCOPY AND STENT PLACEMENT Right 06/28/2018   Procedure: CYSTOSCOPY WITH URETEROSCOPY AND STENT PLACEMENT;  Surgeon: Billey Co, MD;  Location: [facility];  Service: Urology;  Laterality: Right;  . FULGURATION OF BLADDER TUMOR N/A 06/28/2018   Procedure: CYSTOSCOPY BLADDER BIOPSY, FULGERATION OF BLADDER;  Surgeon: Billey Co, MD;  Location: [facility];  Service: Urology;  Laterality: N/A;  . KNEE SURGERY Left   . LAPAROSCOPIC NEPHRECTOMY, HAND ASSISTED Right 07/22/2018   Procedure: HAND ASSISTED LAPAROSCOPIC NEPHRECTOMY;  Surgeon: Billey Co, MD;  Location: [facility];  Service: Urology;  Laterality: Right;  . NASAL SINUS SURGERY  03/15  .  POLYPECTOMY N/A 02/23/2017   Procedure: POLYPECTOMY;  Surgeon: [name], MD;  Location: [facility];  Service: Endoscopy;  Laterality: N/A;  . tooth abstraction    . TRANSURETHRAL RESECTION OF BLADDER TUMOR N/A 05/12/2019   Procedure: TRANSURETHRAL RESECTION OF BLADDER TUMOR (TURBT);  Surgeon: Billey Co, MD;  Location: [facility];  Service: Urology;  Laterality: N/A;  . VARICOCELE EXCISION      SOCIAL HISTORY: Social History   Socioeconomic History  . Marital status: Married    Spouse name: Not on file  . Number of children: Not on file  . Years of education: Not on file  . Highest education level: Not on file  Occupational History  . Not on file  Tobacco Use  . Smoking status: Former Smoker    Types: Cigarettes    Quit date: 06/15/2013    Years since quitting: 5.9  . Smokeless tobacco: Never Used  Substance and Sexual Activity  . Alcohol use: No    Alcohol/week: 0.0 standard drinks  . Drug use: No  . Sexual activity: Yes  Other Topics Concern  . Not on file  Social History Narrative  . Not on file   Social Determinants of Health   Financial Resource Strain:   . Difficulty of Paying Living Expenses: Not on file  Food Insecurity:   . Worried About Charity fundraiser in the Last Year: Not on file  . Ran Out of Food in the Last Year: Not on file  Transportation Needs:   . Lack of Transportation (Medical): Not  on file  . Lack of Transportation (Non-Medical): Not on file  Physical Activity:   . Days of Exercise per Week: Not on file  . Minutes of Exercise per Session: Not on file  Stress:   . Feeling of Stress : Not on file  Social Connections:   . Frequency of Communication with Friends and Family: Not on file  . Frequency of Social Gatherings with Friends and Family: Not on file  . Attends Religious Services: Not on file  . Active Member of Clubs or Organizations: Not on file  . Attends Archivist Meetings: Not on file  . Marital Status:  Not on file  Intimate Partner Violence:   . Fear of Current or Ex-Partner: Not on file  . Emotionally Abused: Not on file  . Physically Abused: Not on file  . Sexually Abused: Not on file    FAMILY HISTORY: Family History  Problem Relation Age of Onset  . Heart disease Father   . Hypertension Father   . COPD Father   . Colon cancer Maternal Grandmother   . Heart attack Paternal Grandfather     ALLERGIES:  is allergic to glucotrol [glipizide].  MEDICATIONS:  Current Outpatient Medications  Medication Sig Dispense Refill  . [place]-Glucosamine-Vit D (OSTEO BI-FLEX ONE PER DAY) TABS Take 1 tablet by mouth daily.    . clobetasol cream (TEMOVATE) [id number] % Apply 1 application topically 2 (two) times daily. To affected skin; too strong for face, groin, underarms (Patient taking differently: Apply 1 application topically daily as needed (irritation). ) 30 g 2  . folic acid (FOLVITE) 1 MG tablet Take 1 mg by mouth daily.   6  . Glucose Blood (ACCU-CHEK AVIVA PLUS VI) by In Vitro route.    Marland Kitchen JARDIANCE 10 MG TABS tablet TAKE 1 TABLET BY MOUTH DAILY (Patient taking differently: Take 10 mg by mouth daily. ) 30 tablet 2  . lisinopril (PRINIVIL,ZESTRIL) 10 MG tablet TAKE 1 TABLET(10 MG) BY MOUTH DAILY (Patient taking differently: Take 10 mg by mouth daily. ) 90 tablet 1  . meloxicam (MOBIC) 15 MG tablet Take 15 mg by mouth daily.     . metFORMIN (GLUCOPHAGE-XR) 500 MG 24 hr tablet Take 3 pills by mouth daily for 4-5 days, then increase to 4 pills daily (if tolerated) (Patient taking differently: Take 2,000 mg by mouth daily with breakfast. ) 360 tablet 0  . methotrexate (RHEUMATREX) 2.5 MG tablet Take 12.5 mg by mouth every Saturday. Caution:Chemotherapy. Protect from light.    . Multiple Vitamin (MULTIVITAMIN) tablet Take 1 tablet by mouth daily.    . Omega-3 Fatty Acids (FISH OIL) 1200 MG CAPS Take 1,200 mg by mouth daily.     . Omeprazole (PRILOSEC PO) Take 20 mg by mouth daily as needed (heart  burn).     . pioglitazone (ACTOS) 45 MG tablet TAKE 1 TABLET(45 MG) BY MOUTH DAILY (Patient taking differently: Take 45 mg by mouth daily. ) 90 tablet 1  . rosuvastatin (CRESTOR) 10 MG tablet Take 1 tablet (10 mg total) by mouth at bedtime. 90 tablet 1   No current facility-administered medications for this visit.     PHYSICAL EXAMINATION: ECOG PERFORMANCE STATUS: 0 - Asymptomatic Vitals:   05/19/19 1118  BP: (!) 161/95  Pulse: 92  Resp: 18  Temp: 97.6 F (36.4 C)   Filed Weights   05/19/19 1118  Weight: 244 lb (110.7 kg)    Physical Exam Constitutional:      General: He is  not in acute distress. HENT:     Head: Normocephalic and atraumatic.  Eyes:     General: No scleral icterus.    Pupils: Pupils are equal, round, and reactive to light.  Cardiovascular:     Rate and Rhythm: Normal rate and regular rhythm.     Heart sounds: Normal heart sounds.  Pulmonary:     Effort: Pulmonary effort is normal. No respiratory distress.     Breath sounds: No wheezing.  Abdominal:     General: Bowel sounds are normal. There is no distension.     Palpations: Abdomen is soft. There is no mass.     Tenderness: There is no abdominal tenderness.  Musculoskeletal:        General: No deformity. Normal range of motion.     Cervical back: Normal range of motion and neck supple.  Skin:    General: Skin is warm and dry.     Findings: No erythema or rash.  Neurological:     Mental Status: He is alert and oriented to person, place, and time.     Cranial Nerves: No cranial nerve deficit.     Coordination: Coordination normal.  Psychiatric:        Behavior: Behavior normal.        Thought Content: Thought content normal.     LABORATORY DATA:  I have reviewed the data as listed Lab Results  Component Value Date   WBC 7.4 05/19/2019   HGB 13.9 05/19/2019   HCT 42.7 05/19/2019   MCV 94.7 05/19/2019   PLT 240 05/19/2019   Recent Labs    06/14/18 0827 08/05/18 0841 04/30/19 0824  05/07/19 0831 05/19/19 1226  NA 138 135  --  135 134*  K 4.4 4.6  --  4.5 4.5  CL 104 99  --  99 98  CO2 24 30  --  25 27  GLUCOSE 167* 212*  --  314* 320*  BUN 20 20  --  29* 26*  CREATININE 0.87 1.04 1.28* 1.33* 1.37*  CALCIUM 9.5 9.7  --  9.4 9.4  GFRNONAA 99 82 >60 >60 58*  GFRAA 114 95 >60 >60 >60  PROT 6.3  --   --   --  7.6  ALBUMIN  --   --   --   --  4.3  AST 16  --   --   --  16  ALT 17  --   --   --  19  ALKPHOS  --   --   --   --  68  BILITOT 0.6  --   --   --  0.5   Iron/TIBC/Ferritin/ %Sat No results found for: IRON, TIBC, FERRITIN, IRONPCTSAT    RADIOGRAPHIC STUDIES: I have personally reviewed the radiological images as listed and agreed with the findings in the report.  DG Chest 2 View  Result Date: 04/30/2019 CLINICAL DATA:  History of prior right nephrectomy for renal cell carcinoma EXAM: CHEST - 2 VIEW COMPARISON:  10/31/2018 FINDINGS: The heart size and mediastinal contours are within normal limits. Both lungs are clear. The visualized skeletal structures are unremarkable. IMPRESSION: No active cardiopulmonary disease. Electronically Signed   By: Inez Catalina M.D.   On: 04/30/2019 12:57   CT Abdomen Pelvis W Contrast  Result Date: 04/30/2019 CLINICAL DATA:  Renal cancer.  Right nephrectomy.  Hematuria. EXAM: CT ABDOMEN AND PELVIS WITH CONTRAST TECHNIQUE: Multidetector CT imaging of the abdomen and pelvis was performed using the standard protocol  following bolus administration of intravenous contrast. CONTRAST:  113mL OMNIPAQUE IOHEXOL 300 MG/ML  SOLN COMPARISON:  10/28/2018 and 06/21/2018. FINDINGS: Lower chest: There hematogenously distributed pulmonary nodules in the lung bases bilaterally. Index nodule in the inferior right lower lobe measures 5 mm (5/12). Heart size normal. Coronary artery calcification. No pericardial or pleural effusion. Distal esophagus is unremarkable. Distributed Hepatobiliary: Possible faint subcentimeter low-attenuation lesion in the  dome of the liver (2/15). Liver and gallbladder are otherwise unremarkable. No biliary ductal dilatation. Pancreas: There may be tiny calcifications in the uncinate process of the pancreas, indicative of prior pancreatitis. Otherwise negative. Spleen: Negative. Adrenals/Urinary Tract: Right adrenal gland is unremarkable. Right nephrectomy with evolving scarring in the surgical bed (2/33). Left adrenal gland is unremarkable. A 2.4 cm hyperdense lesion in the interpolar left kidney is stable and was shown to represent a hyperdense cyst on 06/21/2018. An adjacent well-circumscribed 1.5 cm low-attenuation lesion in the medial interpolar left kidney is similar and likely a cyst. A new hyperattenuating lesion is seen at the right ureterovesical junction, extending into the bladder, measuring approximately 1.4 x 2.0 cm (2/89). Left ureter is decompressed. Bladder is otherwise unremarkable. Stomach/Bowel: Stomach, small bowel, appendix and colon are unremarkable. Vascular/Lymphatic: Atherosclerotic calcification of the aorta without aneurysm. No pathologically enlarged lymph nodes. Reproductive: Prostate is normal in size. Other: No free fluid. Mesenteries and peritoneum are unremarkable. Fatty invagination of the right lateral abdominal wall (2/60), possibly related to prior surgery. Musculoskeletal: No worrisome lytic or sclerotic lesions. IMPRESSION: 1. High attenuation mass at the right ureterovesical junction, with extension into the bladder. Given caliceal extension on [id number], lesion is likely a metastasis. Primary urothelial carcinoma is not excluded. 2. Basilar pulmonary nodules, most indicative of metastatic disease. 3. Aortic atherosclerosis (ICD10-170.0). Coronary artery calcification. Electronically Signed   By: Lorin Picket M.D.   On: 04/30/2019 13:20   DG OR UROLOGY CYSTO IMAGE ([place])  Result Date: 05/12/2019 There is no interpretation for this exam.  This order is for images obtained during a  surgical procedure.  Please See "Surgeries" Tab for more information regarding the procedure.      ASSESSMENT & PLAN:  1. Malignant neoplasm of right kidney ([place])   2. Goals of care, counseling/discussion   3. Lung nodules   4. Psoriasis    #Pathology report reviewed and discussed in details . CT images were independently reviewed by me and discussed with both patient his wife . With the presence of bibasilar lung nodules, bladder involvement clinically patient has metastatic RCC. Recommend obtaining CT chest and bone scan to complete staging. The diagnosis and care plan were discussed with patient in detail.  NCCN guidelines were reviewed and shared with patient.   Check CBC, CMP, LDH. At least intermediate risk group, given treatment to diagnosis less than 1 year The goal of treatment which is to palliate disease, disease related symptoms, improve quality of life and hopefully prolong life was highlighted in our discussion.  Recommend combination of axitinib plus pembrolizumab.  Chemotherapy education was provided.  We had discussed the composition of chemotherapy regimen, length of chemo cycle, duration of treatment and the time to assess response to treatment.    I explained to the patient the risks and benefits of chemotherapy axitinib including all but not limited to hypertension, skin rash, stomatitis, hand-foot syndrome, electrolyte abnormality, gastroenterology side effects, decreased blood count, abnormal liver functions, fatigue, blood clots, heart failure, hair loss, bleeding etc.  I discussed the mechanism of action and rationale of  using immunotherapy.  The goal of therapy is palliative; and length of treatments are likely ongoing/based upon the results of the scans. Discussed the potential side effects of immunotherapy including but not limited to diarrhea; skin rash; respiratory failure, kidney failure, mental status change, elevated LFTs/liver failure,endocrine abnormalities,  acute deterioration  and even death, flareup of pre-existing psoriasis etc. Patient voices understanding and agrees with proceeding with treatment.  #Send tissue sample for NGS  #Psoriasis, on methotrexate.  I urged patient to discuss with his dermatology for clearance of starting immunotherapy, alternative treatments of methotrexate.  Patient understands that there is limited safety data for immunotherapy in patients with pre-existing autoimmune disease.Patient understands the potential risk of severe flaring up psoriasis during immunotherapy.  Currently he does not have significant symptoms from psoriasis.  He prefers to take the risk and try immunotherapy with close monitoring of potential side effects.  # Chemotherapy education; Antiemetics-Compazine;  Supportive care measures are necessary for patient well-being and will be provided as necessary. We spent sufficient time to discuss many aspect of care, questions were answered to patient's satisfaction.   Orders Placed This Encounter  Procedures  . NM Bone Scan Whole Body    Standing Status:   Future    Standing Expiration Date:   05/18/2020    Order Specific Question:   If indicated for the ordered procedure, I authorize the administration of a radiopharmaceutical per Radiology protocol    Answer:   Yes    Order Specific Question:   Preferred imaging location?    Answer:   [facility]    Order Specific Question:   Radiology Contrast Protocol - do NOT remove file path    Answer:   \\[email or web address]  . CT Chest W Contrast    Standing Status:   Future    Standing Expiration Date:   05/18/2020    Order Specific Question:   ** REASON FOR EXAM (FREE TEXT)    Answer:   renal cell carcinoma initial staging    Order Specific Question:   If indicated for the ordered procedure, I authorize the administration of contrast media per Radiology protocol    Answer:   Yes    Order Specific Question:   Preferred imaging  location?    Answer:   [facility]    Order Specific Question:   Radiology Contrast Protocol - do NOT remove file path    Answer:   \\[email or web address]\Radiant\CTProtocols.pdf  . CBC with Differential/Platelet    Standing Status:   Future    Number of Occurrences:   1    Standing Expiration Date:   05/18/2020  . Comprehensive metabolic panel    Standing Status:   Future    Number of Occurrences:   1    Standing Expiration Date:   05/18/2020  . Lactate Dehydrogenase    Standing Status:   Future    Number of Occurrences:   1    Standing Expiration Date:   05/18/2020    All questions were answered. The patient knows to call the clinic with any problems questions or concerns.  cc Billey Co, MD    Return of visit: To be determined. Thank you for this kind referral and the opportunity to participate in the care of this patient. A copy of today's note is routed to referring provider      Earlie Server, MD, PhD Hematology Oncology [facility] Pager- [phone] 05/19/2019

## 2019-05-19 NOTE — Progress Notes (Signed)
START ON PATHWAY REGIMEN - Renal Cell     A cycle is every 21 days:     Axitinib      Pembrolizumab   **Always confirm dose/schedule in your pharmacy ordering system**  Patient Characteristics: Stage IV/Metastatic Disease, Clear Cell, First Line, Intermediate or Poor Risk Therapeutic Status: Stage IV/Metastatic Disease Histology: Clear Cell Line of Therapy: First Line Risk Status: Intermediate Risk Intent of Therapy: Non-Curative / Palliative Intent, Discussed with Patient 

## 2019-05-20 ENCOUNTER — Telehealth: Payer: Self-pay | Admitting: Pharmacist

## 2019-05-20 ENCOUNTER — Telehealth: Payer: Self-pay | Admitting: Pharmacy Technician

## 2019-05-20 DIAGNOSIS — C641 Malignant neoplasm of right kidney, except renal pelvis: Secondary | ICD-10-CM

## 2019-05-20 MED ORDER — AXITINIB 5 MG PO TABS: 5.0000 mg | ORAL_TABLET | Freq: Two times a day (BID) | ORAL | 0 refills | Status: DC

## 2019-05-20 NOTE — Telephone Encounter (Signed)
Oral Oncology Pharmacist Encounter  Received new prescription for Inlyta (axitinib) for the treatment of metastatic clear cell renal carcinoma in conjunction with pembrolizumab, planned duration until disease progression or unacceptable toxicity.  Plan is for patient to be initiated on Inlyta (axitinib) 5 mg PO two times daily. Dose and frequency assessed and are appropriate for patient. Inlyta will be taken in combination with pembroluzimab 200 mg IV every 21 days.   CBC with diff, CMET, and vitals from 05/19/19 assessed:.  Patient's blood glucose was 320 - noted patient has PMH significant for T2DM. Will confirm diabetic medication adherence with patient. Inlyta may exacerbate hyperglycemia (reported in up to 28% of patients). Will reinforce medication adherence and blood glucose checks with patient.   Patient has elevated Scr of 1.37 - no dose adjustments necessary.   Patient had elevated blood pressure of 161/95 mmHg on 05/19/19. Previous reading on 05/12/19 was 136/82 mmHg. Inlyta can increase blood pressure, so will educate patient on signs/symptoms of elevated BP such as HA or blurred vision and also reinforce medication adherence with lisinopril.  Current medication list in Epic reviewed, no DDIs with Inlyta (axitinib) or pemrbolizumab identified.  Prescription has been e-scribed to the [facility] for benefits analysis and approval.  Oral Oncology Clinic will continue to follow for insurance authorization, copayment issues, initial counseling and start date.  Leron Croak, PharmD, [place] PGY2 Hematology/Oncology Pharmacy Resident 05/20/2019 3:41 PM Oral Oncology Clinic [phone]

## 2019-05-20 NOTE — Telephone Encounter (Signed)
Oral Oncology Patient Advocate Encounter  Received notification from [facility] that prior authorization for Inlyta is required.  PA submitted on CoverMyMeds [place] Status is pending  Oral Oncology Clinic will continue to follow.  [place] Patient [place] Phone [phone] Fax [phone] 05/20/2019 2:54 PM

## 2019-05-21 ENCOUNTER — Other Ambulatory Visit: Payer: Self-pay

## 2019-05-21 MED ORDER — AXITINIB 5 MG PO TABS: 5.0000 mg | ORAL_TABLET | Freq: Two times a day (BID) | ORAL | 0 refills | Status: DC

## 2019-05-21 NOTE — Patient Instructions (Signed)
Pembrolizumab injection What is this medicine? PEMBROLIZUMAB (pem broe liz ue mab) is a monoclonal antibody. It is used to treat certain types of cancer. This medicine may be used for other purposes; ask your health care provider or pharmacist if you have questions. COMMON BRAND NAME(S): Keytruda What should I tell my health care provider before I take this medicine? They need to know if you have any of these conditions:  diabetes  immune system problems  inflammatory bowel disease  liver disease  lung or breathing disease  lupus  received or scheduled to receive an organ transplant or a stem-cell transplant that uses donor stem cells  an unusual or allergic reaction to pembrolizumab, other medicines, foods, dyes, or preservatives  pregnant or trying to get pregnant  breast-feeding How should I use this medicine? This medicine is for infusion into a vein. It is given by a health care professional in a hospital or clinic setting. A special MedGuide will be given to you before each treatment. Be sure to read this information carefully each time. Talk to your pediatrician regarding the use of this medicine in children. While this drug may be prescribed for children as young as 6 months for selected conditions, precautions do apply. Overdosage: If you think you have taken too much of this medicine contact a poison control center or emergency room at once. NOTE: This medicine is only for you. Do not share this medicine with others. What if I miss a dose? It is important not to miss your dose. Call your doctor or health care professional if you are unable to keep an appointment. What may interact with this medicine? Interactions have not been studied. Give your health care provider a list of all the medicines, herbs, non-prescription drugs, or dietary supplements you use. Also tell them if you smoke, drink alcohol, or use illegal drugs. Some items may interact with your medicine. This  list may not describe all possible interactions. Give your health care provider a list of all the medicines, herbs, non-prescription drugs, or dietary supplements you use. Also tell them if you smoke, drink alcohol, or use illegal drugs. Some items may interact with your medicine. What should I watch for while using this medicine? Your condition will be monitored carefully while you are receiving this medicine. You may need blood work done while you are taking this medicine. Do not become pregnant while taking this medicine or for 4 months after stopping it. Women should inform their doctor if they wish to become pregnant or think they might be pregnant. There is a potential for serious side effects to an unborn child. Talk to your health care professional or pharmacist for more information. Do not breast-feed an infant while taking this medicine or for 4 months after the last dose. What side effects may I notice from receiving this medicine? Side effects that you should report to your doctor or health care professional as soon as possible:  allergic reactions like skin rash, itching or hives, swelling of the face, lips, or tongue  bloody or black, tarry  breathing problems  changes in vision  chest pain  chills  confusion  constipation  cough  diarrhea  dizziness or feeling faint or lightheaded  fast or irregular heartbeat  fever  flushing  joint pain  low blood counts - this medicine may decrease the number of white blood cells, red blood cells and platelets. You may be at increased risk for infections and bleeding.  muscle pain  muscle   weakness  pain, tingling, numbness in the hands or feet  persistent headache  redness, blistering, peeling or loosening of the skin, including inside the mouth  signs and symptoms of high blood sugar such as dizziness; dry mouth; dry skin; fruity breath; nausea; stomach pain; increased hunger or thirst; increased urination  signs  and symptoms of kidney injury like trouble passing urine or change in the amount of urine  signs and symptoms of liver injury like dark urine, light-colored stools, loss of appetite, nausea, right upper belly pain, yellowing of the eyes or skin  sweating  swollen lymph nodes  weight loss Side effects that usually do not require medical attention (report to your doctor or health care professional if they continue or are bothersome):  decreased appetite  hair loss  muscle pain  tiredness This list may not describe all possible side effects. Call your doctor for medical advice about side effects. You may report side effects to FDA at 1-800-FDA-1088. Where should I keep my medicine? This drug is given in a hospital or clinic and will not be stored at home. NOTE: This sheet is a summary. It may not cover all possible information. If you have questions about this medicine, talk to your doctor, pharmacist, or health care provider.  2020 Elsevier/Gold Standard (2019-03-07 18:07:58)  

## 2019-05-21 NOTE — Telephone Encounter (Signed)
Oral Oncology Patient Advocate Encounter  Prior Authorization for Matthew [facility]ard has been approved.    PA# [id number] Effective dates: 05/20/19 through 05/19/20  [facility]  Oral Oncology Clinic will continue to follow.   [facility] Patient [place] Phone [phone] Fax [phone] 05/21/2019 8:49 AM

## 2019-05-21 NOTE — Telephone Encounter (Signed)
Oral Chemotherapy Pharmacist Encounter  Due to insurance restriction the medication could not be filled at [facility]. Prescription has been e[facility]scribed to [facility].  Supportive information was faxed to [facility]. We will continue to follow medication access.   Called patient to let him know.  Darl Pikes, PharmD, BCPS, [facility] Hematology/Oncology Clinical Pharmacist [facility] [place] [facility] [phone]  05/21/2019 9:20 AM

## 2019-05-22 ENCOUNTER — Ambulatory Visit: Payer: BC Managed Care – PPO | Admitting: Urology

## 2019-05-22 ENCOUNTER — Inpatient Hospital Stay: Payer: BC Managed Care – PPO

## 2019-05-22 ENCOUNTER — Other Ambulatory Visit: Payer: Self-pay

## 2019-05-22 ENCOUNTER — Inpatient Hospital Stay (HOSPITAL_BASED_OUTPATIENT_CLINIC_OR_DEPARTMENT_OTHER): Payer: BC Managed Care – PPO | Admitting: Oncology

## 2019-05-22 DIAGNOSIS — C641 Malignant neoplasm of right kidney, except renal pelvis: Secondary | ICD-10-CM

## 2019-05-22 MED ORDER — PROCHLORPERAZINE MALEATE 10 MG PO TABS: 10.0000 mg | ORAL_TABLET | Freq: Four times a day (QID) | ORAL | 0 refills | Status: DC | PRN

## 2019-05-22 NOTE — Progress Notes (Signed)
[place]  Telephone:([phone] [phone]  Patient Care Team: [name], NP as PCP - General [name], [name], MD as PCP - Family Medicine (Family Medicine) [name], MD as Consulting Physician (General Surgery) Arnetha Courser, MD as Attending Physician (Family Medicine) [name], [name], MD as Consulting Physician (Dermatology)   Name of the patient: Matthew Woodard  [id number]  [birth year]   Date of visit: 05/22/19  Diagnosis-kidney cancer  Chief complaint/Reason for visit- Initial Meeting for [facility], preparing for starting chemotherapy  Heme/Onc history:  Oncology History  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      Interval history-[name] is a 54 year old male who presents to chemo care clinic today for initial meeting in preparation for starting chemotherapy. I introduced the chemo care clinic and we discussed that the role of the clinic is to assist those who are at an increased risk of emergency room visits and/or complications during the course of chemotherapy treatment. We discussed that the increased risk takes into account factors such as age, performance status, and co-morbidities. We also discussed that for some, this might include barriers to care such as not having a primary care provider, lack of insurance/transportation, or not being able to afford medications. We discussed that the goal of the program is to help prevent unplanned ER visits and help reduce complications during chemotherapy. We do this by discussing specific risk factors to each individual and identifying ways that we can help improve  these risk factors and reduce barriers to care.   ECOG FS:0 - Asymptomatic  Review of systems- Review of Systems  Constitutional: Negative.  Negative for chills, fever, malaise/fatigue and weight loss.  HENT: Negative for congestion, ear pain and tinnitus.   Eyes: Negative.  Negative for blurred vision and double vision.  Respiratory: Negative.  Negative for cough, sputum production and shortness of breath.   Cardiovascular: Negative.  Negative for chest pain, palpitations and leg swelling.  Gastrointestinal: Negative.  Negative for abdominal pain, constipation, diarrhea, nausea and vomiting.  Genitourinary: Negative for dysuria, frequency and urgency.  Musculoskeletal: Negative for back pain and falls.  Skin: Negative.  Negative for rash.  Neurological: Negative.  Negative for weakness and headaches.  Endo/Heme/Allergies: Negative.  Does not bruise/bleed easily.  Psychiatric/Behavioral: Negative.  Negative for depression. The patient is not nervous/anxious and does not have insomnia.      Current treatment- Keytruda+ Inlyta-awaiting inlyta  Allergies  Allergen Reactions  . Glucotrol [Glipizide] Other (See Comments)    Glucose level spiked then dropped    Past Medical History:  Diagnosis Date  . Arthritis    hands, left knee  . Diabetes mellitus without complication ([place])   . Eczema 01/08/2017  . GERD (gastroesophageal reflux disease)   . History of kidney cancer   . Hyperlipidemia LDL goal <100 11/02/2014  . Hypertension   . Kidney stone    YEAR H/O HEMATURIA  . Psoriasis   . Wears dentures    full upper, partial lower.  Has, does not wear    Past Surgical History:  Procedure Laterality Date  . COLONOSCOPY WITH PROPOFOL N/A 02/23/2017   Procedure: COLONOSCOPY  WITH PROPOFOL;  Surgeon: [name], MD;  Location: [place];  Service: Endoscopy;  Laterality: N/A;  Diabetic - oral meds  . CYST EXCISION  Aug. 2016   on back  . CYSTOSCOPY W/ RETROGRADES Right  06/28/2018   Procedure: CYSTOSCOPY WITH RETROGRADE PYELOGRAM;  Surgeon: Billey Co, MD;  Location: [facility];  Service: Urology;  Laterality: Right;  . CYSTOSCOPY W/ URETERAL STENT REMOVAL Right 07/22/2018   Procedure: CYSTOSCOPY WITH STENT REMOVAL;  Surgeon: Billey Co, MD;  Location: [facility];  Service: Urology;  Laterality: Right;  . CYSTOSCOPY WITH BIOPSY Right 06/28/2018   Procedure: CYSTOSCOPY WITH RENAL BIOPSY;  Surgeon: Billey Co, MD;  Location: [facility];  Service: Urology;  Laterality: Right;  . CYSTOSCOPY WITH URETEROSCOPY AND STENT PLACEMENT Right 06/28/2018   Procedure: CYSTOSCOPY WITH URETEROSCOPY AND STENT PLACEMENT;  Surgeon: Billey Co, MD;  Location: [facility];  Service: Urology;  Laterality: Right;  . FULGURATION OF BLADDER TUMOR N/A 06/28/2018   Procedure: CYSTOSCOPY BLADDER BIOPSY, FULGERATION OF BLADDER;  Surgeon: Billey Co, MD;  Location: [facility];  Service: Urology;  Laterality: N/A;  . KNEE SURGERY Left   . LAPAROSCOPIC NEPHRECTOMY, HAND ASSISTED Right 07/22/2018   Procedure: HAND ASSISTED LAPAROSCOPIC NEPHRECTOMY;  Surgeon: Billey Co, MD;  Location: [facility];  Service: Urology;  Laterality: Right;  . NASAL SINUS SURGERY  03/15  . POLYPECTOMY N/A 02/23/2017   Procedure: POLYPECTOMY;  Surgeon: [name], MD;  Location: [place];  Service: Endoscopy;  Laterality: N/A;  . tooth abstraction    . TRANSURETHRAL RESECTION OF BLADDER TUMOR N/A 05/12/2019   Procedure: TRANSURETHRAL RESECTION OF BLADDER TUMOR (TURBT);  Surgeon: Billey Co, MD;  Location: [facility];  Service: Urology;  Laterality: N/A;  . VARICOCELE EXCISION      Social History   Socioeconomic History  . Marital status: Married    Spouse name: Not on file  . Number of children: Not on file  . Years of education: Not on file  . Highest education level: Not on file  Occupational History  . Not on file  Tobacco Use  . Smoking status: Former Smoker    Types:  Cigarettes    Quit date: 06/15/2013    Years since quitting: 5.9  . Smokeless tobacco: Never Used  Substance and Sexual Activity  . Alcohol use: No    Alcohol/week: 0.0 standard drinks  . Drug use: No  . Sexual activity: Yes  Other Topics Concern  . Not on file  Social History Narrative  . Not on file   Social Determinants of Health   Financial Resource Strain:   . Difficulty of Paying Living Expenses: Not on file  Food Insecurity:   . Worried About Charity fundraiser in the Last Year: Not on file  . Ran Out of Food in the Last Year: Not on file  Transportation Needs:   . Lack of Transportation (Medical): Not on file  . Lack of Transportation (Non-Medical): Not on file  Physical Activity:   . Days of Exercise per Week: Not on file  . Minutes of Exercise per Session: Not on file  Stress:   . Feeling of Stress : Not on file  Social Connections:   . Frequency of Communication with Friends and Family: Not on file  . Frequency of Social Gatherings with Friends and Family: Not on file  . Attends Religious Services: Not on file  . Active Member of Clubs or Organizations:  Not on file  . Attends Archivist Meetings: Not on file  . Marital Status: Not on file  Intimate Partner Violence:   . Fear of Current or Ex-Partner: Not on file  . Emotionally Abused: Not on file  . Physically Abused: Not on file  . Sexually Abused: Not on file    Family History  Problem Relation Age of Onset  . Heart disease Father   . Hypertension Father   . COPD Father   . Colon cancer Maternal Grandmother   . Heart attack Paternal Grandfather      Current Outpatient Medications:  .  axitinib (INLYTA) 5 MG tablet, Take 1 tablet (5 mg total) by mouth 2 (two) times daily., Disp: 60 tablet, Rfl: 0 .  Boswellia-Glucosamine-Vit D (OSTEO BI-FLEX ONE PER DAY) TABS, Take 1 tablet by mouth daily., Disp: , Rfl:  .  clobetasol cream (TEMOVATE) 4.40 %, Apply 1 application topically 2 (two) times  daily. To affected skin; too strong for face, groin, underarms (Patient taking differently: Apply 1 application topically daily as needed (irritation). ), Disp: 30 g, Rfl: 2 .  folic acid (FOLVITE) 1 MG tablet, Take 1 mg by mouth daily. , Disp: , Rfl: 6 .  Glucose Blood (ACCU-CHEK AVIVA PLUS VI), by In Vitro route., Disp: , Rfl:  .  JARDIANCE 10 MG TABS tablet, TAKE 1 TABLET BY MOUTH DAILY (Patient taking differently: Take 10 mg by mouth daily. ), Disp: 30 tablet, Rfl: 2 .  lisinopril (PRINIVIL,ZESTRIL) 10 MG tablet, TAKE 1 TABLET(10 MG) BY MOUTH DAILY (Patient taking differently: Take 10 mg by mouth daily. ), Disp: 90 tablet, Rfl: 1 .  meloxicam (MOBIC) 15 MG tablet, Take 15 mg by mouth daily. , Disp: , Rfl:  .  metFORMIN (GLUCOPHAGE-XR) 500 MG 24 hr tablet, Take 3 pills by mouth daily for 4-5 days, then increase to 4 pills daily (if tolerated) (Patient taking differently: Take 2,000 mg by mouth daily with breakfast. ), Disp: 360 tablet, Rfl: 0 .  methotrexate (RHEUMATREX) 2.5 MG tablet, Take 12.5 mg by mouth every Saturday. Caution:Chemotherapy. Protect from light., Disp: , Rfl:  .  Multiple Vitamin (MULTIVITAMIN) tablet, Take 1 tablet by mouth daily., Disp: , Rfl:  .  Omega-3 Fatty Acids (FISH OIL) 1200 MG CAPS, Take 1,200 mg by mouth daily. , Disp: , Rfl:  .  Omeprazole (PRILOSEC PO), Take 20 mg by mouth daily as needed (heart burn). , Disp: , Rfl:  .  pioglitazone (ACTOS) 45 MG tablet, TAKE 1 TABLET(45 MG) BY MOUTH DAILY (Patient taking differently: Take 45 mg by mouth daily. ), Disp: 90 tablet, Rfl: 1 .  prochlorperazine (COMPAZINE) 10 MG tablet, Take 1 tablet (10 mg total) by mouth every 6 (six) hours as needed for nausea or vomiting., Disp: 60 tablet, Rfl: 0 .  rosuvastatin (CRESTOR) 10 MG tablet, Take 1 tablet (10 mg total) by mouth at bedtime., Disp: 90 tablet, Rfl: 1  Physical exam: There were no vitals filed for this visit. Physical Exam Constitutional:      Appearance: Normal  appearance.  HENT:     Head: Normocephalic and atraumatic.  Eyes:     Pupils: Pupils are equal, round, and reactive to light.  Cardiovascular:     Rate and Rhythm: Normal rate and regular rhythm.     Heart sounds: Normal heart sounds. No murmur.  Pulmonary:     Effort: Pulmonary effort is normal.     Breath sounds: Normal breath sounds. No wheezing.  Abdominal:     General: Bowel sounds are normal. There is no distension.     Palpations: Abdomen is soft.     Tenderness: There is no abdominal tenderness.  Musculoskeletal:        General: Normal range of motion.     Cervical back: Normal range of motion.  Skin:    General: Skin is warm and dry.     Findings: No rash.  Neurological:     Mental Status: He is alert and oriented to person, place, and time.  Psychiatric:        Judgment: Judgment normal.      CMP Latest Ref Rng & Units 05/19/2019  Glucose 70 - 99 mg/dL 320(H)  BUN 6 - 20 mg/dL 26(H)  Creatinine 0.61 - 1.24 mg/dL 1.37(H)  Sodium 135 - 145 mmol/L 134(L)  Potassium 3.5 - 5.1 mmol/L 4.5  Chloride 98 - 111 mmol/L 98  CO2 22 - 32 mmol/L 27  Calcium 8.9 - 10.3 mg/dL 9.4  Total Protein 6.5 - 8.1 g/dL 7.6  Total Bilirubin 0.3 - 1.2 mg/dL 0.5  Alkaline Phos 38 - 126 U/L 68  AST 15 - 41 U/L 16  ALT 0 - 44 U/L 19   CBC Latest Ref Rng & Units 05/19/2019  WBC 4.0 - 10.5 K/uL 7.4  Hemoglobin 13.0 - 17.0 g/dL 13.9  Hematocrit 39.0 - 52.0 % 42.7  Platelets 150 - 400 K/uL 240    No images are attached to the encounter.  DG Chest 2 View  Result Date: 04/30/2019 CLINICAL DATA:  History of prior right nephrectomy for renal cell carcinoma EXAM: CHEST - 2 VIEW COMPARISON:  10/31/2018 FINDINGS: The heart size and mediastinal contours are within normal limits. Both lungs are clear. The visualized skeletal structures are unremarkable. IMPRESSION: No active cardiopulmonary disease. Electronically Signed   By: Inez Catalina M.D.   On: 04/30/2019 12:57   CT Abdomen Pelvis W  Contrast  Result Date: 04/30/2019 CLINICAL DATA:  Renal cancer.  Right nephrectomy.  Hematuria. EXAM: CT ABDOMEN AND PELVIS WITH CONTRAST TECHNIQUE: Multidetector CT imaging of the abdomen and pelvis was performed using the standard protocol following bolus administration of intravenous contrast. CONTRAST:  123m OMNIPAQUE IOHEXOL 300 MG/ML  SOLN COMPARISON:  10/28/2018 and 06/21/2018. FINDINGS: Lower chest: There hematogenously distributed pulmonary nodules in the lung bases bilaterally. Index nodule in the inferior right lower lobe measures 5 mm (5/12). Heart size normal. Coronary artery calcification. No pericardial or pleural effusion. Distal esophagus is unremarkable. Distributed Hepatobiliary: Possible faint subcentimeter low-attenuation lesion in the dome of the liver (2/15). Liver and gallbladder are otherwise unremarkable. No biliary ductal dilatation. Pancreas: There may be tiny calcifications in the uncinate process of the pancreas, indicative of prior pancreatitis. Otherwise negative. Spleen: Negative. Adrenals/Urinary Tract: Right adrenal gland is unremarkable. Right nephrectomy with evolving scarring in the surgical bed (2/33). Left adrenal gland is unremarkable. A 2.4 cm hyperdense lesion in the interpolar left kidney is stable and was shown to represent a hyperdense cyst on 06/21/2018. An adjacent well-circumscribed 1.5 cm low-attenuation lesion in the medial interpolar left kidney is similar and likely a cyst. A new hyperattenuating lesion is seen at the right ureterovesical junction, extending into the bladder, measuring approximately 1.4 x 2.0 cm (2/89). Left ureter is decompressed. Bladder is otherwise unremarkable. Stomach/Bowel: Stomach, small bowel, appendix and colon are unremarkable. Vascular/Lymphatic: Atherosclerotic calcification of the aorta without aneurysm. No pathologically enlarged lymph nodes. Reproductive: Prostate is normal in size. Other: No free fluid.  Mesenteries and  peritoneum are unremarkable. Fatty invagination of the right lateral abdominal wall (2/60), possibly related to prior surgery. Musculoskeletal: No worrisome lytic or sclerotic lesions. IMPRESSION: 1. High attenuation mass at the right ureterovesical junction, with extension into the bladder. Given caliceal extension on 61/47/0929, lesion is likely a metastasis. Primary urothelial carcinoma is not excluded. 2. Basilar pulmonary nodules, most indicative of metastatic disease. 3. Aortic atherosclerosis (ICD10-170.0). Coronary artery calcification. Electronically Signed   By: Lorin Picket M.D.   On: 04/30/2019 13:20   DG OR UROLOGY CYSTO IMAGE ([place])  Result Date: 05/12/2019 There is no interpretation for this exam.  This order is for images obtained during a surgical procedure.  Please See "Surgeries" Tab for more information regarding the procedure.     Assessment and plan- Patient is a 54 y.o. male who presents to [facility] for initial meeting in preparation for starting chemotherapy for the treatment of kidney cancer.    1. HPI: Patient initially developed gross hematuria and was evaluated by Dr. Caprice Beaver in February 2020.  Had CT urogram done which showed a 4 cm central enhancing renal mass with invasion into the collecting system.  X-ray of the chest was negative for metastatic findings.  On 06/28/2018 he had a cystoscopy with a bladder biopsy.  On 07/22/2018 had right radical laparoscopic nephrectomy surgical margins negative for tumor.  He was scheduled for every 26-monthCT scans of his abdomen pelvis for surveillance of kidney cancer.  CT scan from 10/28/2018 was negative for metastatic disease and on 04/30/2019 revealed mass of right ureteral vesicle junction and bilateral pulmonary nodules suggestive of metastatic disease.  Underwent cystoscopy and transurethral resection of tumor.  Pathology revealed clear cell renal cell carcinoma. Plan is 6 cycles of  Keytruda + Inlyta and repeat  imaging.   2. Chemo Care Clinic/High Risk for ER/Hospitalization during chemotherapy- We discussed the role of the chemo care clinic and identified patient specific risk factors. I discussed that patient was identified as high risk primarily based on: Stage of cancer and commodities.  Patient has past medical history positive for: Past Medical History:  Diagnosis Date  . Arthritis    hands, left knee  . [place]   . Eczema 01/08/2017  . GERD (gastroesophageal reflux disease)   . History of kidney cancer   . Hyperlipidemia LDL goal <100 11/02/2014  . Hypertension   . Kidney stone    YEAR H/O HEMATURIA  . Psoriasis   . Wears dentures    full upper, partial lower.  Has, does not wear    Patient has past surgical history positive for: Past Surgical History:  Procedure Laterality Date  . COLONOSCOPY WITH PROPOFOL N/A 02/23/2017   Procedure: COLONOSCOPY WITH PROPOFOL;  Surgeon: WLucilla Lame MD;  [place]  Service: Endoscopy;  Laterality: N/A;  Diabetic - oral meds  . CYST EXCISION  Aug. 2016   on back  . CYSTOSCOPY W/ RETROGRADES Right 06/28/2018   Procedure: CYSTOSCOPY WITH RETROGRADE PYELOGRAM;  Surgeon: SBilley Co MD;  Location: [facility];  Service: Urology;  Laterality: Right;  . CYSTOSCOPY W/ URETERAL STENT REMOVAL Right 07/22/2018   Procedure: CYSTOSCOPY WITH STENT REMOVAL;  Surgeon: SBilley Co MD;  Location: [facility];  Service: Urology;  Laterality: Right;  . CYSTOSCOPY WITH BIOPSY Right 06/28/2018   Procedure: CYSTOSCOPY WITH RENAL BIOPSY;  Surgeon: SBilley Co MD;  Location: [facility];  Service: Urology;  Laterality: Right;  .  CYSTOSCOPY WITH URETEROSCOPY AND STENT PLACEMENT Right 06/28/2018   Procedure: CYSTOSCOPY WITH URETEROSCOPY AND STENT PLACEMENT;  Surgeon: Billey Co, MD;  Location: [facility];  Service: Urology;  Laterality: Right;  . FULGURATION OF BLADDER TUMOR N/A 06/28/2018   Procedure:  CYSTOSCOPY BLADDER BIOPSY, FULGERATION OF BLADDER;  Surgeon: Billey Co, MD;  Location: [facility];  Service: Urology;  Laterality: N/A;  . KNEE SURGERY Left   . LAPAROSCOPIC NEPHRECTOMY, HAND ASSISTED Right 07/22/2018   Procedure: HAND ASSISTED LAPAROSCOPIC NEPHRECTOMY;  Surgeon: Billey Co, MD;  Location: [facility];  Service: Urology;  Laterality: Right;  . NASAL SINUS SURGERY  03/15  . POLYPECTOMY N/A 02/23/2017   Procedure: POLYPECTOMY;  Surgeon: [name], MD;  Location: [place];  Service: Endoscopy;  Laterality: N/A;  . tooth abstraction    . TRANSURETHRAL RESECTION OF BLADDER TUMOR N/A 05/12/2019   Procedure: TRANSURETHRAL RESECTION OF BLADDER TUMOR (TURBT);  Surgeon: Billey Co, MD;  Location: [facility];  Service: Urology;  Laterality: N/A;  . VARICOCELE EXCISION      Based on our high risk symptom management report; this patient has a high risk of ED utilization.  The percentage below indicates how "at risk "  this patient based on the factors in this table within one year.   Risk of Admission or ED Visit  This score indicates an adult patient's 1-year risk, as a percentage, of a hospital admission or ED visit.    0%  3%   5 months ago 3 months ago 8 weeks ago Today  3% 11/22/18 2000   3% 05/22/19 1205    Factor Value  Current PCP [name], NP  Hospital Admissions 4   ED Visits 0  Has Medicaid No  Has Medicare No  In relationship Yes  Has Anemia No  Has asthma No  Has atrial fibrillation No  Has CVD No  Has chronic kidney disease No  Has Chronic Obstructive Pulmonary Disease No  Has Congestive Heart Failure No  Has Connective Tissue Disorder No  Has Depression No  Has Diabetes Yes  Has liver disease No  Has Peripheral Vascular Disease No    3. We discussed that social determinants of health may have significant impacts on health and outcomes for cancer patients.  Today we discussed specific social determinants of performance status,  alcohol use, depression, financial needs, food insecurity, housing, interpersonal violence, social connections, stress, tobacco use, and transportation.    After lengthy discussion the following were identified as areas of need: None at this time.  We discussed options including home based and outpatient services, DME, and CARE program. We discusssed that patients who participate in regular physical activity report fewer negative impacts of cancer and treatments and report less fatigue.  We discussed self-referral to [facility] for counseling services, psychiatry for medication management, or palliative care/symptom management as well as primary care providers.  We discussed that living with cancer can create tremendous financial burden.  We discussed options for assistance. I asked that if assistance is needed in affording medications or paying bills to please let us know so that we can provide assistance.  We discussed options for food including social services.  We will also notify [facility] to see if cancer center can provide support.  Patient informed of food pantry at cancer center and was provided with care package today.  Please notify nursing if un-met needs. We discussed referral to social work. Will also discuss with Elease Etienne  to see if [facility] can provide support of utility bills, etc.  We discussed options for support groups at the cancer center. If interested, please notify nurse navigator to enroll.  We discussed options for managing stress including healthy eating, exercise as well as participating in no charge counseling services at the cancer center and support groups.  If these are of interest, patient can notify either myself or primary nursing team. We discussed options for management including medications and referral to quit Smart program We discussed options for transportation including acta, paratransit, bus routes, link transit, taxi/uber/lyft, and cancer center van.   I have notified primary oncology team who will help assist with arranging Lucianne Lei transportation for appointments when needed. We also discussed options for transportation on short notice/acute visits.   5. Based on stage of cancer and/or identified needs today, I will refer patient to palliative care for goals of care and advanced care planning.  We also discussed the role of the Symptom Management Clinic at [facility] for acute issues and methods of contacting clinic/provider. He denies needing specific assistance at this time.   Visit Diagnosis 1. Malignant neoplasm of right kidney [facility])     Patient expressed understanding and was in agreement with this plan. He also understands that He can call clinic at any time with any questions, concerns, or complaints.   A total of (25) minutes of face-to-face time was spent with this patient with greater than 50% of that time in counseling and care-coordination.  [facility] [place]  CC: Dr. Tasia Catchings

## 2019-05-27 ENCOUNTER — Encounter: Payer: Self-pay | Admitting: Oncology

## 2019-05-28 ENCOUNTER — Ambulatory Visit
Admission: RE | Admit: 2019-05-28 | Discharge: 2019-05-28 | Disposition: A | Discharge status: Home / Self Care | Payer: BC Managed Care – PPO | Source: Ambulatory Visit | Attending: Oncology | Admitting: Oncology

## 2019-05-28 ENCOUNTER — Encounter
Admission: RE | Admit: 2019-05-28 | Discharge: 2019-05-28 | Disposition: A | Discharge status: Home / Self Care | Payer: BC Managed Care – PPO | Source: Ambulatory Visit | Attending: Oncology | Admitting: Oncology

## 2019-05-28 ENCOUNTER — Other Ambulatory Visit: Payer: Self-pay

## 2019-05-28 ENCOUNTER — Other Ambulatory Visit: Payer: Self-pay | Admitting: Oncology

## 2019-05-28 DIAGNOSIS — C641 Malignant neoplasm of right kidney, except renal pelvis: Secondary | ICD-10-CM | POA: Insufficient documentation

## 2019-05-28 HISTORY — DX: Malignant (primary) neoplasm, unspecified: C80.1

## 2019-05-28 MED ORDER — TECHNETIUM TC 99M MEDRONATE IV KIT
22.4600 | PACK | Freq: Once | INTRAVENOUS | Status: AC | PRN
  Administered 2019-05-28: 22.46 via INTRAVENOUS

## 2019-05-28 MED ORDER — IOHEXOL 300 MG/ML  SOLN
75.0000 mL | Freq: Once | INTRAMUSCULAR | Status: AC | PRN
  Administered 2019-05-28: 75 mL via INTRAVENOUS

## 2019-06-02 ENCOUNTER — Ambulatory Visit: Payer: BC Managed Care – PPO | Attending: Internal Medicine

## 2019-06-02 DIAGNOSIS — Z20822 Contact with and (suspected) exposure to covid-19: Secondary | ICD-10-CM

## 2019-06-03 ENCOUNTER — Encounter: Payer: Self-pay | Admitting: Oncology

## 2019-06-04 LAB — NOVEL CORONAVIRUS, NAA: SARS-CoV-2, NAA: NOT DETECTED

## 2019-06-05 ENCOUNTER — Other Ambulatory Visit: Payer: Self-pay

## 2019-06-05 ENCOUNTER — Inpatient Hospital Stay: Payer: BC Managed Care – PPO

## 2019-06-05 ENCOUNTER — Inpatient Hospital Stay (HOSPITAL_BASED_OUTPATIENT_CLINIC_OR_DEPARTMENT_OTHER): Payer: BC Managed Care – PPO | Admitting: Oncology

## 2019-06-05 ENCOUNTER — Encounter: Payer: Self-pay | Admitting: Oncology

## 2019-06-05 VITALS — BP 126/74 | HR 76 | Temp 96.2°F | Resp 18 | Wt 248.1 lb

## 2019-06-05 DIAGNOSIS — Z5111 Encounter for antineoplastic chemotherapy: Secondary | ICD-10-CM | POA: Diagnosis not present

## 2019-06-05 DIAGNOSIS — C641 Malignant neoplasm of right kidney, except renal pelvis: Secondary | ICD-10-CM

## 2019-06-05 DIAGNOSIS — Z7189 Other specified counseling: Secondary | ICD-10-CM | POA: Insufficient documentation

## 2019-06-05 DIAGNOSIS — Z5112 Encounter for antineoplastic immunotherapy: Secondary | ICD-10-CM | POA: Diagnosis not present

## 2019-06-05 LAB — CBC WITH DIFFERENTIAL/PLATELET
Abs Immature Granulocytes: 0.03 10*3/uL (ref 0.00–0.07)
Basophils Absolute: 0 10*3/uL (ref 0.0–0.1)
Basophils Relative: 1 %
Eosinophils Absolute: 0.1 10*3/uL (ref 0.0–0.5)
Eosinophils Relative: 2 %
HCT: 41.8 % (ref 39.0–52.0)
Hemoglobin: 13.4 g/dL (ref 13.0–17.0)
Immature Granulocytes: 1 %
Lymphocytes Relative: 28 %
Lymphs Abs: 1.5 10*3/uL (ref 0.7–4.0)
MCH: 30.7 pg (ref 26.0–34.0)
MCHC: 32.1 g/dL (ref 30.0–36.0)
MCV: 95.7 fL (ref 80.0–100.0)
Monocytes Absolute: 0.4 10*3/uL (ref 0.1–1.0)
Monocytes Relative: 8 %
Neutro Abs: 3.2 10*3/uL (ref 1.7–7.7)
Neutrophils Relative %: 60 %
Platelets: 234 10*3/uL (ref 150–400)
RBC: 4.37 MIL/uL (ref 4.22–5.81)
RDW: 12.5 % (ref 11.5–15.5)
WBC: 5.3 10*3/uL (ref 4.0–10.5)
nRBC: 0 % (ref 0.0–0.2)

## 2019-06-05 LAB — COMPREHENSIVE METABOLIC PANEL
ALT: 16 U/L (ref 0–44)
AST: 19 U/L (ref 15–41)
Albumin: 4 g/dL (ref 3.5–5.0)
Alkaline Phosphatase: 63 U/L (ref 38–126)
Anion gap: 7 (ref 5–15)
BUN: 32 mg/dL — ABNORMAL HIGH (ref 6–20)
CO2: 26 mmol/L (ref 22–32)
Calcium: 9.4 mg/dL (ref 8.9–10.3)
Chloride: 99 mmol/L (ref 98–111)
Creatinine, Ser: 1.29 mg/dL — ABNORMAL HIGH (ref 0.61–1.24)
GFR calc Af Amer: >60 mL/min (ref >60)
GFR calc non Af Amer: >60 mL/min (ref >60)
Glucose, Bld: 304 mg/dL — ABNORMAL HIGH (ref 70–99)
Potassium: 4.9 mmol/L (ref 3.5–5.1)
Sodium: 132 mmol/L — ABNORMAL LOW (ref 135–145)
Total Bilirubin: 0.6 mg/dL (ref 0.3–1.2)
Total Protein: 6.9 g/dL (ref 6.5–8.1)

## 2019-06-05 LAB — TSH: TSH: 1.228 u[IU]/mL (ref 0.350–4.500)

## 2019-06-05 LAB — PROTEIN, URINE, RANDOM: Total Protein, Urine: <6 mg/dL

## 2019-06-05 MED ORDER — LOPERAMIDE HCL 2 MG PO CAPS: 2.0000 mg | ORAL_CAPSULE | ORAL | 0 refills | Status: DC

## 2019-06-05 MED ORDER — SODIUM CHLORIDE 0.9 % IV SOLN
Freq: Once | INTRAVENOUS | Status: AC
  Filled 2019-06-05: qty 250

## 2019-06-05 MED ORDER — SODIUM CHLORIDE 0.9 % IV SOLN
200.0000 mg | Freq: Once | INTRAVENOUS | Status: AC
  Administered 2019-06-05: 200 mg via INTRAVENOUS
  Filled 2019-06-05: qty 8

## 2019-06-05 MED ORDER — HEPARIN SOD (PORK) LOCK FLUSH 100 UNIT/ML IV SOLN
500.0000 [IU] | Freq: Once | INTRAVENOUS | Status: DC | PRN
  Filled 2019-06-05: qty 5

## 2019-06-05 NOTE — Progress Notes (Signed)
Patient does not offer any problems today.  

## 2019-06-05 NOTE — Addendum Note (Signed)
Addended by: Earlie Server on: 06/05/2019 01:36 PM   Modules accepted: Orders

## 2019-06-05 NOTE — Progress Notes (Signed)
Hematology/Oncology Consult note [facility] [phone] [phone]   Patient Care Team: [name], NP as PCP - General [name], [name], MD as PCP - Family Medicine (Family Medicine) [name], MD as Consulting Physician (General Surgery) [name], [name], MD as Attending Physician (Family Medicine) [name], [name], MD as Consulting Physician (Dermatology)  REFERRING PROVIDER: [name], NP  CHIEF COMPLAINTS/REASON FOR VISIT:  Evaluation of kidney cancer  HISTORY OF PRESENTING ILLNESS:   Matthew Woodard is a  54 y.o.  male with PMH listed below was seen in consultation at the request of  [name], NP  for evaluation of kidney cancer Patient's cancer history dated back to February 2020 when he developed gross hematuria with small clots and right-sided flank pain.  Patient had a CT urogram done which showed right 4 cm central enhancing renal mass with invasion into the collecting system.  X-ray of chest was performed to see complete staging which showed no concerning findings. 06/28/2018 patient underwent a cystoscopy, bladder biopsy and a right retrograde pyelogram with intraoperative interpretation, right diagnostic ureteroscopy, right renal pelvis biopsy, right ureteral stent placement.  biopsy showed atypical cell clusters, with extensive crush artifact, nondiagnostic.   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  Surgical margins negative for tumor.pT3a,Nx Patient has been on surveillance after surgery. 10/28/2018 CT abdomen pelvis with contrast showed minimal fluid and or postoperative changes within the right renal fossa.  No evidence of metastatic disease or recurrence.  Patient has left kidney lesion previously characterized as  nonenhancing cyst by multi phasic contrast-enhanced CT. Patient reports an episode of gross hematuria in early December 2020, patient is due for 46-month surveillance CT scan. 04/30/2019 CT abdomen pelvis with contrast showed high attenuation mass at the right uretero vesicle junction, with extension into the bladder.  Bilateral pulmonary nodules, most indicated for metastatic disease.   Patient underwent cystoscopy and transurethral resection of irregular bladder tumor 3 cm.  Mass appeared to emanate from the right ureteral orifice.  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 Patient was referred to me for further work-up and management.  #Staging chest CT with contrast showed numerous bilateral pulmonary nodules.  Compatible with metastatic disease. Bone scan showed no osseous metastatic disease.   #History of tobacco abuse, former smoker.  Quit in 2015.  He denies any hemoptysis, chest pain, shortness of breath today. Patient reports that hematuria has completely resolved.  Denies any pain today. He is accompanied by his wife.  #History of cutaneous psoriasis, mostly on his right hand, currently on weekly methotrexate 12.5 mg with good symptom control.  INTERVAL HISTORY Matthew Woodard is a 54 y.o. male who has above history reviewed by me today presents for follow up visit for management of metastatic RCC. Problems and complaints are listed below: Patient was accompanied by wife. Patient has had chemotherapy class.  Has received Axitinib  NGS: [facility] PD L1 TPS 1% He has had a bone scan and CT chest for staging.  Denies any new complaints.  Feels well.  Review of Systems  Constitutional: Negative for appetite change, chills, fatigue, fever and unexpected weight change.  HENT:   Negative for hearing loss and voice change.   Eyes: Negative for eye problems and icterus.  Respiratory: Negative for chest tightness, cough and shortness  of  breath.   Cardiovascular: Negative for chest pain and leg swelling.  Gastrointestinal: Negative for abdominal distention and abdominal pain.  Endocrine: Negative for hot flashes.  Genitourinary: Negative for difficulty urinating, dysuria and frequency.        See HPI  Musculoskeletal: Negative for arthralgias.  Skin: Negative for itching and rash.  Neurological: Negative for light-headedness and numbness.  Hematological: Negative for adenopathy. Does not bruise/bleed easily.  Psychiatric/Behavioral: Negative for confusion.    MEDICAL HISTORY:  Past Medical History:  Diagnosis Date  . Arthritis    hands, left knee  . Cancer ([place])   . Diabetes mellitus without complication ([place])   . Eczema 01/08/2017  . GERD (gastroesophageal reflux disease)   . History of kidney cancer   . Hyperlipidemia LDL goal <100 11/02/2014  . Hypertension   . Kidney stone    YEAR H/O HEMATURIA  . Psoriasis   . Wears dentures    full upper, partial lower.  Has, does not wear    SURGICAL HISTORY: Past Surgical History:  Procedure Laterality Date  . COLONOSCOPY WITH PROPOFOL N/A 02/23/2017   Procedure: COLONOSCOPY WITH PROPOFOL;  Surgeon: [name], MD;  Location: [place];  Service: Endoscopy;  Laterality: N/A;  Diabetic - oral meds  . CYST EXCISION  Aug. 2016   on back  . CYSTOSCOPY W/ RETROGRADES Right 06/28/2018   Procedure: CYSTOSCOPY WITH RETROGRADE PYELOGRAM;  Surgeon: Billey Co, MD;  Location: [facility];  Service: Urology;  Laterality: Right;  . CYSTOSCOPY W/ URETERAL STENT REMOVAL Right 07/22/2018   Procedure: CYSTOSCOPY WITH STENT REMOVAL;  Surgeon: Billey Co, MD;  Location: [facility];  Service: Urology;  Laterality: Right;  . CYSTOSCOPY WITH BIOPSY Right 06/28/2018   Procedure: CYSTOSCOPY WITH RENAL BIOPSY;  Surgeon: Billey Co, MD;  Location: [facility];  Service: Urology;  Laterality: Right;  . CYSTOSCOPY WITH URETEROSCOPY AND STENT PLACEMENT Right 06/28/2018    Procedure: CYSTOSCOPY WITH URETEROSCOPY AND STENT PLACEMENT;  Surgeon: Billey Co, MD;  Location: [facility];  Service: Urology;  Laterality: Right;  . FULGURATION OF BLADDER TUMOR N/A 06/28/2018   Procedure: CYSTOSCOPY BLADDER BIOPSY, FULGERATION OF BLADDER;  Surgeon: Billey Co, MD;  Location: [facility];  Service: Urology;  Laterality: N/A;  . KNEE SURGERY Left   . LAPAROSCOPIC NEPHRECTOMY, HAND ASSISTED Right 07/22/2018   Procedure: HAND ASSISTED LAPAROSCOPIC NEPHRECTOMY;  Surgeon: Billey Co, MD;  Location: [facility];  Service: Urology;  Laterality: Right;  . NASAL SINUS SURGERY  03/15  . POLYPECTOMY N/A 02/23/2017   Procedure: POLYPECTOMY;  Surgeon: [name], MD;  Location: [place];  Service: Endoscopy;  Laterality: N/A;  . tooth abstraction    . TRANSURETHRAL RESECTION OF BLADDER TUMOR N/A 05/12/2019   Procedure: TRANSURETHRAL RESECTION OF BLADDER TUMOR (TURBT);  Surgeon: Billey Co, MD;  Location: [facility];  Service: Urology;  Laterality: N/A;  . VARICOCELE EXCISION      SOCIAL HISTORY: Social History   Socioeconomic History  . Marital status: Married    Spouse name: Not on file  . Number of children: Not on file  . Years of education: Not on file  . Highest education level: Not on file  Occupational History  . Not on file  Tobacco Use  . Smoking status: Former Smoker    Types: Cigarettes    Quit date: 06/15/2013    Years since quitting: 5.9  . Smokeless tobacco: Never Used  Substance and Sexual Activity  .  Alcohol use: No    Alcohol/week: 0.0 standard drinks  . Drug use: No  . Sexual activity: Yes  Other Topics Concern  . Not on file  Social History Narrative  . Not on file   Social Determinants of Health   Financial Resource Strain:   . Difficulty of Paying Living Expenses: Not on file  Food Insecurity:   . Worried About Charity fundraiser in the Last Year: Not on file  . Ran Out of Food in the Last Year: Not on file    Transportation Needs:   . Lack of Transportation (Medical): Not on file  . Lack of Transportation (Non-Medical): Not on file  Physical Activity:   . Days of Exercise per Week: Not on file  . Minutes of Exercise per Session: Not on file  Stress:   . Feeling of Stress : Not on file  Social Connections:   . Frequency of Communication with Friends and Family: Not on file  . Frequency of Social Gatherings with Friends and Family: Not on file  . Attends Religious Services: Not on file  . Active Member of Clubs or Organizations: Not on file  . Attends Archivist Meetings: Not on file  . Marital Status: Not on file  Intimate Partner Violence:   . Fear of Current or Ex-Partner: Not on file  . Emotionally Abused: Not on file  . Physically Abused: Not on file  . Sexually Abused: Not on file    FAMILY HISTORY: Family History  Problem Relation Age of Onset  . Heart disease Father   . Hypertension Father   . COPD Father   . Colon cancer Maternal Grandmother   . Heart attack Paternal Grandfather     ALLERGIES:  is allergic to glucotrol [glipizide].  MEDICATIONS:  Current Outpatient Medications  Medication Sig Dispense Refill  . axitinib (INLYTA) 5 MG tablet Take 1 tablet (5 mg total) by mouth 2 (two) times daily. 60 tablet 0  . [place]-Glucosamine-Vit D (OSTEO BI-FLEX ONE PER DAY) TABS Take 1 tablet by mouth daily.    . clobetasol cream (TEMOVATE) [id number] % Apply 1 application topically 2 (two) times daily. To affected skin; too strong for face, groin, underarms (Patient taking differently: Apply 1 application topically daily as needed (irritation). ) 30 g 2  . folic acid (FOLVITE) 1 MG tablet Take 1 mg by mouth daily.   6  . Glucose Blood (ACCU-CHEK AVIVA PLUS VI) by In Vitro route.    Marland Kitchen JARDIANCE 10 MG TABS tablet TAKE 1 TABLET BY MOUTH DAILY (Patient taking differently: Take 10 mg by mouth daily. ) 30 tablet 2  . lisinopril (PRINIVIL,ZESTRIL) 10 MG tablet TAKE 1 TABLET(10  MG) BY MOUTH DAILY (Patient taking differently: Take 10 mg by mouth daily. ) 90 tablet 1  . meloxicam (MOBIC) 15 MG tablet Take 15 mg by mouth daily.     . metFORMIN (GLUCOPHAGE-XR) 500 MG 24 hr tablet Take 3 pills by mouth daily for 4-5 days, then increase to 4 pills daily (if tolerated) (Patient taking differently: Take 2,000 mg by mouth daily with breakfast. ) 360 tablet 0  . methotrexate (RHEUMATREX) 2.5 MG tablet Take 12.5 mg by mouth every Saturday. Caution:Chemotherapy. Protect from light.    . Multiple Vitamin (MULTIVITAMIN) tablet Take 1 tablet by mouth daily.    . Omega-3 Fatty Acids (FISH OIL) 1200 MG CAPS Take 1,200 mg by mouth daily.     . Omeprazole (PRILOSEC PO) Take 20 mg by  mouth daily as needed (heart burn).     . pioglitazone (ACTOS) 45 MG tablet TAKE 1 TABLET(45 MG) BY MOUTH DAILY (Patient taking differently: Take 45 mg by mouth daily. ) 90 tablet 1  . prochlorperazine (COMPAZINE) 10 MG tablet Take 1 tablet (10 mg total) by mouth every 6 (six) hours as needed for nausea or vomiting. 60 tablet 0  . rosuvastatin (CRESTOR) 10 MG tablet Take 1 tablet (10 mg total) by mouth at bedtime. 90 tablet 1   No current facility-administered medications for this visit.     PHYSICAL EXAMINATION: ECOG PERFORMANCE STATUS: 0 - Asymptomatic Vitals:   06/05/19 0921  BP: 126/74  Pulse: 76  Resp: 18  Temp: (!) 96.2 F (35.7 C)   Filed Weights   06/05/19 0921  Weight: 248 lb 1.6 oz (112.5 kg)    Physical Exam Constitutional:      General: He is not in acute distress. HENT:     Head: Normocephalic and atraumatic.  Eyes:     General: No scleral icterus.    Pupils: Pupils are equal, round, and reactive to light.  Cardiovascular:     Rate and Rhythm: Normal rate and regular rhythm.     Heart sounds: Normal heart sounds.  Pulmonary:     Effort: Pulmonary effort is normal. No respiratory distress.     Breath sounds: No wheezing.  Abdominal:     General: Bowel sounds are normal.  There is no distension.     Palpations: Abdomen is soft. There is no mass.     Tenderness: There is no abdominal tenderness.  Musculoskeletal:        General: No deformity. Normal range of motion.     Cervical back: Normal range of motion and neck supple.  Skin:    General: Skin is warm and dry.     Findings: No erythema or rash.  Neurological:     Mental Status: He is alert and oriented to person, place, and time.     Cranial Nerves: No cranial nerve deficit.     Coordination: Coordination normal.  Psychiatric:        Behavior: Behavior normal.        Thought Content: Thought content normal.     LABORATORY DATA:  I have reviewed the data as listed Lab Results  Component Value Date   WBC 7.4 05/19/2019   HGB 13.9 05/19/2019   HCT 42.7 05/19/2019   MCV 94.7 05/19/2019   PLT 240 05/19/2019   Recent Labs    06/14/18 0827 07/23/18 0326 08/05/18 0841 10/28/18 0820 04/30/19 0824 05/07/19 0831 05/19/19 1226  NA 138   < > 135  --   --  135 134*  K 4.4   < > 4.6  --   --  4.5 4.5  CL 104   < > 99  --   --  99 98  CO2 24   < > 30  --   --  25 27  GLUCOSE 167*   < > 212*  --   --  314* 320*  BUN 20   < > 20  --   --  29* 26*  CREATININE 0.87   < > 1.04   < > 1.28* 1.33* 1.37*  CALCIUM 9.5   < > 9.7  --   --  9.4 9.4  GFRNONAA 99   < > 82  --  >60 >60 58*  GFRAA 114   < > 95  --  >  60 >60 >60  PROT 6.3  --   --   --   --   --  7.6  ALBUMIN  --   --   --   --   --   --  4.3  AST 16  --   --   --   --   --  16  ALT 17  --   --   --   --   --  19  ALKPHOS  --   --   --   --   --   --  68  BILITOT 0.6  --   --   --   --   --  0.5   < > = values in this interval not displayed.   Iron/TIBC/Ferritin/ %Sat No results found for: IRON, TIBC, FERRITIN, IRONPCTSAT    RADIOGRAPHIC STUDIES: I have personally reviewed the radiological images as listed and agreed with the findings in the report.  CT Chest W Contrast  Result Date: 05/28/2019 CLINICAL DATA:  Renal cell carcinoma.   Staging. EXAM: CT CHEST WITH CONTRAST TECHNIQUE: Multidetector CT imaging of the chest was performed during intravenous contrast administration. CONTRAST:  26mL OMNIPAQUE IOHEXOL 300 MG/ML  SOLN COMPARISON:  None. FINDINGS: Cardiovascular: The heart size is normal. No substantial pericardial effusion. Mediastinum/Nodes: Left hilar lymphadenopathy measures up to 2.3 cm short axis. Upper normal mediastinal lymph nodes evident. The esophagus has normal imaging features. There is no axillary lymphadenopathy. Lungs/Pleura: Multiple bilateral pulmonary nodules measure up to a maximum size of approximately 10 mm in the right middle lobe. Cluster of tree-in-bud nodularity in the left base likely reflects atypical infection. No pleural effusion. Upper Abdomen: Unremarkable. Musculoskeletal: No worrisome lytic or sclerotic osseous abnormality. IMPRESSION: 1. Numerous bilateral pulmonary nodules measuring up to 10 mm diameter. Imaging features compatible with metastatic disease. 2. Left hilar lymphadenopathy, also highly suspicious for metastatic involvement. Electronically Signed   By: Misty Stanley M.D.   On: 05/28/2019 13:14   NM Bone Scan Whole Body  Result Date: 05/29/2019 CLINICAL DATA:  Bladder tumor, history of clear cell carcinoma of the RIGHT kidney EXAM: NUCLEAR MEDICINE WHOLE BODY BONE SCAN TECHNIQUE: Whole body anterior and posterior images were obtained approximately 3 hours after intravenous injection of radiopharmaceutical. RADIOPHARMACEUTICALS:  22.457 mCi Technetium-50m MDP IV COMPARISON:  None Correlation: CT chest 05/28/2019, CT abdomen and pelvis 04/30/2019 FINDINGS: Uptake at the shoulders, sternoclavicular joints, and knees, typically degenerative. Scattered physiologic uptake within costal cartilage. No worrisome sites of osseous tracer accumulation are identified to suggest osseous metastatic disease. Expected urinary tract and soft tissue distribution of tracer. IMPRESSION: No scintigraphic  evidence of osseous metastatic disease. Electronically Signed   By: Lavonia Dana M.D.   On: 05/29/2019 08:21   DG OR UROLOGY CYSTO IMAGE ([place])  Result Date: 05/12/2019 There is no interpretation for this exam.  This order is for images obtained during a surgical procedure.  Please See "Surgeries" Tab for more information regarding the procedure.      ASSESSMENT & PLAN:  1. Clear cell carcinoma of right kidney ([place])   2. Renal cell carcinoma of right kidney metastatic to other site ([place])   3. Encounter for antineoplastic chemotherapy   4. Encounter for antineoplastic immunotherapy   5. 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 CT, bone scan images were independently reviewed by me and discussed with patient and wife.  Compatible with metastatic disease.  Recommend axitinib with Keytruda goal of care is palliative intent.  Patient understands that his disease is not curable.  Goal of care, palliative intent. Patient has discussed with his rheumatologist and methotrexate has been discontinued.  Patient understands that immunotherapy may exacerbate his psoriasis.  Patient and wife agree with the treatment plan. labs reviewed and discussed with patient.  And wife.  Counts acceptable to proceed with Keytruda.  Patient was advised to start axitinib 5 mg twice daily today.  Patient will follow up in clinic in 1 week for evaluation of tolerability of chemotherapy treatments.  Patient was advised to call if he experiences any side effects.   Supportive care measures are necessary for patient well-being and will be provided as necessary. We spent sufficient time to discuss many aspect of care, questions were answered to patient's satisfaction.    All questions were answered. The patient knows to call the clinic with any problems questions or concerns.  cc [name], NP    Return of visit: 1 week    Earlie Server, MD, PhD Hematology Oncology [facility] Pager- [phone] 06/05/2019

## 2019-06-06 ENCOUNTER — Other Ambulatory Visit: Payer: Self-pay | Admitting: Oncology

## 2019-06-06 ENCOUNTER — Encounter: Payer: Self-pay | Admitting: Oncology

## 2019-06-06 ENCOUNTER — Telehealth: Payer: Self-pay

## 2019-06-06 DIAGNOSIS — C641 Malignant neoplasm of right kidney, except renal pelvis: Secondary | ICD-10-CM

## 2019-06-06 LAB — T4: T4, Total: 7.3 ug/dL (ref 4.5–12.0)

## 2019-06-06 NOTE — Telephone Encounter (Signed)
Telephone call to patient for follow up after receiving first infusion yesterday.  Patient states he is feeling "great".  Denies any nausea or pain.  Eating good and drinking plenty of fluids.  Encouraged patient to call for any questions or concerns.

## 2019-06-10 ENCOUNTER — Encounter: Payer: Self-pay | Admitting: Oncology

## 2019-06-12 ENCOUNTER — Other Ambulatory Visit: Payer: Self-pay

## 2019-06-12 ENCOUNTER — Encounter: Payer: Self-pay | Admitting: Oncology

## 2019-06-12 NOTE — Progress Notes (Signed)
Patient pre screened for office appointment, no questions or concerns today. Patient reminded of upcoming appointment time and date. 

## 2019-06-13 ENCOUNTER — Telehealth: Payer: Self-pay

## 2019-06-13 ENCOUNTER — Encounter: Payer: Self-pay | Admitting: Oncology

## 2019-06-13 ENCOUNTER — Inpatient Hospital Stay: Payer: BC Managed Care – PPO

## 2019-06-13 ENCOUNTER — Other Ambulatory Visit: Payer: Self-pay

## 2019-06-13 ENCOUNTER — Inpatient Hospital Stay (HOSPITAL_BASED_OUTPATIENT_CLINIC_OR_DEPARTMENT_OTHER): Payer: BC Managed Care – PPO | Admitting: Oncology

## 2019-06-13 VITALS — BP 142/86 | HR 80 | Temp 96.1°F | Resp 20 | Wt 245.8 lb

## 2019-06-13 DIAGNOSIS — R040 Epistaxis: Secondary | ICD-10-CM | POA: Diagnosis not present

## 2019-06-13 DIAGNOSIS — C641 Malignant neoplasm of right kidney, except renal pelvis: Secondary | ICD-10-CM | POA: Diagnosis not present

## 2019-06-13 DIAGNOSIS — Z5112 Encounter for antineoplastic immunotherapy: Secondary | ICD-10-CM | POA: Diagnosis not present

## 2019-06-13 LAB — COMPREHENSIVE METABOLIC PANEL
ALT: 21 U/L (ref 0–44)
AST: 26 U/L (ref 15–41)
Albumin: 4.1 g/dL (ref 3.5–5.0)
Alkaline Phosphatase: 72 U/L (ref 38–126)
Anion gap: 9 (ref 5–15)
BUN: 22 mg/dL — ABNORMAL HIGH (ref 6–20)
CO2: 24 mmol/L (ref 22–32)
Calcium: 9.1 mg/dL (ref 8.9–10.3)
Chloride: 101 mmol/L (ref 98–111)
Creatinine, Ser: 1.3 mg/dL — ABNORMAL HIGH (ref 0.61–1.24)
GFR calc Af Amer: >60 mL/min (ref >60)
GFR calc non Af Amer: >60 mL/min (ref >60)
Glucose, Bld: 284 mg/dL — ABNORMAL HIGH (ref 70–99)
Potassium: 4.3 mmol/L (ref 3.5–5.1)
Sodium: 134 mmol/L — ABNORMAL LOW (ref 135–145)
Total Bilirubin: 0.6 mg/dL (ref 0.3–1.2)
Total Protein: 7.2 g/dL (ref 6.5–8.1)

## 2019-06-13 LAB — CBC WITH DIFFERENTIAL/PLATELET
Abs Immature Granulocytes: 0.05 10*3/uL (ref 0.00–0.07)
Basophils Absolute: 0 10*3/uL (ref 0.0–0.1)
Basophils Relative: 1 %
Eosinophils Absolute: 0.1 10*3/uL (ref 0.0–0.5)
Eosinophils Relative: 1 %
HCT: 44.5 % (ref 39.0–52.0)
Hemoglobin: 14.8 g/dL (ref 13.0–17.0)
Immature Granulocytes: 1 %
Lymphocytes Relative: 36 %
Lymphs Abs: 2.3 10*3/uL (ref 0.7–4.0)
MCH: 30.8 pg (ref 26.0–34.0)
MCHC: 33.3 g/dL (ref 30.0–36.0)
MCV: 92.5 fL (ref 80.0–100.0)
Monocytes Absolute: 0.5 10*3/uL (ref 0.1–1.0)
Monocytes Relative: 8 %
Neutro Abs: 3.4 10*3/uL (ref 1.7–7.7)
Neutrophils Relative %: 53 %
Platelets: 218 10*3/uL (ref 150–400)
RBC: 4.81 MIL/uL (ref 4.22–5.81)
RDW: 12.1 % (ref 11.5–15.5)
WBC: 6.3 10*3/uL (ref 4.0–10.5)
nRBC: 0 % (ref 0.0–0.2)

## 2019-06-13 NOTE — Progress Notes (Signed)
Hematology/Oncology follow up note [facility] [phone] [phone]   Patient Care Team: [name], NP as PCP - General [name], [name], MD as PCP - Family Medicine (Family Medicine) [name], MD as Consulting Physician (General Surgery) [name], [name], MD as Attending Physician (Family Medicine) [name], [name], MD as Consulting Physician (Dermatology)  REFERRING PROVIDER: [name], NP  CHIEF COMPLAINTS/REASON FOR VISIT:  Evaluation of kidney cancer  HISTORY OF PRESENTING ILLNESS:   Matthew Woodard is a  54 y.o.  male with PMH listed below was seen in consultation at the request of  [name], NP  for evaluation of kidney cancer Patient's cancer history dated back to February 2020 when he developed gross hematuria with small clots and right-sided flank pain.  Patient had a CT urogram done which showed right 4 cm central enhancing renal mass with invasion into the collecting system.  X-ray of chest was performed to see complete staging which showed no concerning findings. 06/28/2018 patient underwent a cystoscopy, bladder biopsy and a right retrograde pyelogram with intraoperative interpretation, right diagnostic ureteroscopy, right renal pelvis biopsy, right ureteral stent placement.  biopsy showed atypical cell clusters, with extensive crush artifact, nondiagnostic.   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  Surgical margins negative for tumor.pT3a,Nx Patient has been on surveillance after surgery. 10/28/2018 CT abdomen pelvis with contrast showed minimal fluid and or postoperative changes within the right renal fossa.  No evidence of metastatic disease or recurrence.  Patient has left kidney lesion previously characterized as  nonenhancing cyst by multi phasic contrast-enhanced CT. Patient reports an episode of gross hematuria in early December 2020, patient is due for 75-month surveillance CT scan. 04/30/2019 CT abdomen pelvis with contrast showed high attenuation mass at the right uretero vesicle junction, with extension into the bladder.  Bilateral pulmonary nodules, most indicated for metastatic disease.   Patient underwent cystoscopy and transurethral resection of irregular bladder tumor 3 cm.  Mass appeared to emanate from the right ureteral orifice.  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 Patient was referred to me for further work-up and management.  #Staging chest CT with contrast showed numerous bilateral pulmonary nodules.  Compatible with metastatic disease. Bone scan showed no osseous metastatic disease.   #History of tobacco abuse, former smoker.  Quit in 2015.  He denies any hemoptysis, chest pain, shortness of breath today. Patient reports that hematuria has completely resolved.  Denies any pain today. He is accompanied by his wife.  #History of cutaneous psoriasis, mostly on his right hand, currently on weekly methotrexate 12.5 mg with good symptom control. #NGS: [facility] PD L1 TPS 1%  INTERVAL HISTORY JAETYN Woodard Woodard is a 54 y.o. male who has above history reviewed by me today presents for follow up visit for management of metastatic RCC. Problems and complaints are listed below: Patient was accompanied by wife. He is status post cycle 1 pembrolizumab 1 week ago.  Also has been on axitinib for 1 week. He reports tolerating well. He had 2 mild episode of epistaxis, spontaneously resolved. No other new complaints No additional hematuria episodes.  Review of Systems  Constitutional: Negative for appetite change, chills, fatigue, fever and unexpected weight change.  HENT:   Negative for hearing loss and voice change.   Eyes: Negative for eye  problems and icterus.  Respiratory: Negative for chest tightness, cough and shortness of breath.   Cardiovascular: Negative for chest pain and leg swelling.  Gastrointestinal: Negative for abdominal distention and abdominal pain.  Endocrine: Negative for hot flashes.  Genitourinary: Negative for difficulty urinating, dysuria and frequency.        See HPI  Musculoskeletal: Negative for arthralgias.  Skin: Negative for itching and rash.  Neurological: Negative for light-headedness and numbness.  Hematological: Negative for adenopathy. Does not bruise/bleed easily.  Psychiatric/Behavioral: Negative for confusion.    MEDICAL HISTORY:  Past Medical History:  Diagnosis Date  . Arthritis    hands, left knee  . Cancer ([place])   . Diabetes mellitus without complication ([place])   . Eczema 01/08/2017  . GERD (gastroesophageal reflux disease)   . History of kidney cancer   . Hyperlipidemia LDL goal <100 11/02/2014  . Hypertension   . Kidney stone    YEAR H/O HEMATURIA  . Psoriasis   . Wears dentures    full upper, partial lower.  Has, does not wear    SURGICAL HISTORY: Past Surgical History:  Procedure Laterality Date  . COLONOSCOPY WITH PROPOFOL N/A 02/23/2017   Procedure: COLONOSCOPY WITH PROPOFOL;  Surgeon: [name], MD;  Location: [facility];  Service: Endoscopy;  Laterality: N/A;  Diabetic - oral meds  . CYST EXCISION  Aug. 2016   on back  . CYSTOSCOPY W/ RETROGRADES Right 06/28/2018   Procedure: CYSTOSCOPY WITH RETROGRADE PYELOGRAM;  Surgeon: Billey Co, MD;  Location: [facility];  Service: Urology;  Laterality: Right;  . CYSTOSCOPY W/ URETERAL STENT REMOVAL Right 07/22/2018   Procedure: CYSTOSCOPY WITH STENT REMOVAL;  Surgeon: Billey Co, MD;  Location: [facility];  Service: Urology;  Laterality: Right;  . CYSTOSCOPY WITH BIOPSY Right 06/28/2018   Procedure: CYSTOSCOPY WITH RENAL BIOPSY;  Surgeon: Billey Co, MD;  Location: [facility];  Service: Urology;   Laterality: Right;  . CYSTOSCOPY WITH URETEROSCOPY AND STENT PLACEMENT Right 06/28/2018   Procedure: CYSTOSCOPY WITH URETEROSCOPY AND STENT PLACEMENT;  Surgeon: Billey Co, MD;  Location: [facility];  Service: Urology;  Laterality: Right;  . FULGURATION OF BLADDER TUMOR N/A 06/28/2018   Procedure: CYSTOSCOPY BLADDER BIOPSY, FULGERATION OF BLADDER;  Surgeon: Billey Co, MD;  Location: [facility];  Service: Urology;  Laterality: N/A;  . KNEE SURGERY Left   . LAPAROSCOPIC NEPHRECTOMY, HAND ASSISTED Right 07/22/2018   Procedure: HAND ASSISTED LAPAROSCOPIC NEPHRECTOMY;  Surgeon: Billey Co, MD;  Location: [facility];  Service: Urology;  Laterality: Right;  . NASAL SINUS SURGERY  03/15  . POLYPECTOMY N/A 02/23/2017   Procedure: POLYPECTOMY;  Surgeon: [name], MD;  Location: [facility];  Service: Endoscopy;  Laterality: N/A;  . tooth abstraction    . TRANSURETHRAL RESECTION OF BLADDER TUMOR N/A 05/12/2019   Procedure: TRANSURETHRAL RESECTION OF BLADDER TUMOR (TURBT);  Surgeon: Billey Co, MD;  Location: [facility];  Service: Urology;  Laterality: N/A;  . VARICOCELE EXCISION      SOCIAL HISTORY: Social History   Socioeconomic History  . Marital status: Married    Spouse name: Not on file  . Number of children: Not on file  . Years of education: Not on file  . Highest education level: Not on file  Occupational History  . Not on file  Tobacco Use  . Smoking status: Former Smoker    Types: Cigarettes    Quit date: 06/15/2013    Years since quitting: 5.9  .  Smokeless tobacco: Never Used  Substance and Sexual Activity  . Alcohol use: No    Alcohol/week: 0.0 standard drinks  . Drug use: No  . Sexual activity: Yes  Other Topics Concern  . Not on file  Social History Narrative  . Not on file   Social Determinants of Health   Financial Resource Strain:   . Difficulty of Paying Living Expenses: Not on file  Food Insecurity:   . Worried About Charity fundraiser  in the Last Year: Not on file  . Ran Out of Food in the Last Year: Not on file  Transportation Needs:   . Lack of Transportation (Medical): Not on file  . Lack of Transportation (Non-Medical): Not on file  Physical Activity:   . Days of Exercise per Week: Not on file  . Minutes of Exercise per Session: Not on file  Stress:   . Feeling of Stress : Not on file  Social Connections:   . Frequency of Communication with Friends and Family: Not on file  . Frequency of Social Gatherings with Friends and Family: Not on file  . Attends Religious Services: Not on file  . Active Member of Clubs or Organizations: Not on file  . Attends Archivist Meetings: Not on file  . Marital Status: Not on file  Intimate Partner Violence:   . Fear of Current or Ex-Partner: Not on file  . Emotionally Abused: Not on file  . Physically Abused: Not on file  . Sexually Abused: Not on file    FAMILY HISTORY: Family History  Problem Relation Age of Onset  . Heart disease Father   . Hypertension Father   . COPD Father   . Colon cancer Maternal Grandmother   . Heart attack Paternal Grandfather     ALLERGIES:  is allergic to glucotrol [glipizide].  MEDICATIONS:  Current Outpatient Medications  Medication Sig Dispense Refill  . axitinib (INLYTA) 5 MG tablet Take 1 tablet (5 mg total) by mouth 2 (two) times daily. 60 tablet 0  . [place]-Glucosamine-Vit D (OSTEO BI-FLEX ONE PER DAY) TABS Take 1 tablet by mouth daily.    . clobetasol cream (TEMOVATE) [id number] % Apply 1 application topically 2 (two) times daily. To affected skin; too strong for face, groin, underarms (Patient taking differently: Apply 1 application topically daily as needed (irritation). ) 30 g 2  . Glucose Blood (ACCU-CHEK AVIVA PLUS VI) by In Vitro route.    Marland Kitchen JARDIANCE 10 MG TABS tablet TAKE 1 TABLET BY MOUTH DAILY (Patient taking differently: Take 10 mg by mouth daily. ) 30 tablet 2  . lisinopril (PRINIVIL,ZESTRIL) 10 MG tablet TAKE 1  TABLET(10 MG) BY MOUTH DAILY (Patient taking differently: Take 10 mg by mouth daily. ) 90 tablet 1  . loperamide (IMODIUM) 2 MG capsule Take 1 capsule (2 mg total) by mouth See admin instructions. With onset of loose stool, take 4mg  followed by 2mg  every 2 hours until 12 hours have passed without loose bowel movement. Maximum: 16 mg/day 60 capsule 0  . meloxicam (MOBIC) 15 MG tablet Take 15 mg by mouth daily.     . metFORMIN (GLUCOPHAGE-XR) 500 MG 24 hr tablet Take 3 pills by mouth daily for 4-5 days, then increase to 4 pills daily (if tolerated) (Patient taking differently: Take 2,000 mg by mouth daily with breakfast. ) 360 tablet 0  . Multiple Vitamin (MULTIVITAMIN) tablet Take 1 tablet by mouth daily.    . Omega-3 Fatty Acids (FISH OIL) 1200 MG  CAPS Take 1,200 mg by mouth daily.     . Omeprazole (PRILOSEC PO) Take 20 mg by mouth daily as needed (heart burn).     . pioglitazone (ACTOS) 45 MG tablet TAKE 1 TABLET(45 MG) BY MOUTH DAILY (Patient taking differently: Take 45 mg by mouth daily. ) 90 tablet 1  . prochlorperazine (COMPAZINE) 10 MG tablet Take 1 tablet (10 mg total) by mouth every 6 (six) hours as needed for nausea or vomiting. 60 tablet 0  . rosuvastatin (CRESTOR) 10 MG tablet Take 1 tablet (10 mg total) by mouth at bedtime. 90 tablet 1   No current facility-administered medications for this visit.     PHYSICAL EXAMINATION: ECOG PERFORMANCE STATUS: 0 - Asymptomatic Vitals:   06/13/19 1044  BP: (!) 142/86  Pulse: 80  Resp: 20  Temp: (!) 96.1 F (35.6 C)   Filed Weights   06/13/19 1044  Weight: 245 lb 12.8 oz (111.5 kg)    Physical Exam Constitutional:      General: He is not in acute distress. HENT:     Head: Normocephalic and atraumatic.  Eyes:     General: No scleral icterus.    Pupils: Pupils are equal, round, and reactive to light.  Cardiovascular:     Rate and Rhythm: Normal rate and regular rhythm.     Heart sounds: Normal heart sounds.  Pulmonary:      Effort: Pulmonary effort is normal. No respiratory distress.     Breath sounds: No wheezing.  Abdominal:     General: Bowel sounds are normal. There is no distension.     Palpations: Abdomen is soft. There is no mass.     Tenderness: There is no abdominal tenderness.  Musculoskeletal:        General: No deformity. Normal range of motion.     Cervical back: Normal range of motion and neck supple.  Skin:    General: Skin is warm and dry.     Findings: No erythema or rash.  Neurological:     Mental Status: He is alert and oriented to person, place, and time. Mental status is at baseline.     Cranial Nerves: No cranial nerve deficit.     Coordination: Coordination normal.  Psychiatric:        Mood and Affect: Mood normal.        Behavior: Behavior normal.        Thought Content: Thought content normal.     LABORATORY DATA:  I have reviewed the data as listed Lab Results  Component Value Date   WBC 6.3 06/13/2019   HGB 14.8 06/13/2019   HCT 44.5 06/13/2019   MCV 92.5 06/13/2019   PLT 218 06/13/2019   Recent Labs    05/19/19 1226 06/05/19 0851 06/13/19 1005  NA 134* 132* 134*  K 4.5 4.9 4.3  CL 98 99 101  CO2 27 26 24   GLUCOSE 320* 304* 284*  BUN 26* 32* 22*  CREATININE 1.37* 1.29* 1.30*  CALCIUM 9.4 9.4 9.1  GFRNONAA 58* >60 >60  GFRAA >60 >60 >60  PROT 7.6 6.9 7.2  ALBUMIN 4.3 4.0 4.1  AST 16 19 26   ALT 19 16 21   ALKPHOS 68 63 72  BILITOT 0.5 0.6 0.6   Iron/TIBC/Ferritin/ %Sat No results found for: IRON, TIBC, FERRITIN, IRONPCTSAT    RADIOGRAPHIC STUDIES: I have personally reviewed the radiological images as listed and agreed with the findings in the report.  CT Chest W Contrast  Result Date: 05/28/2019  CLINICAL DATA:  Renal cell carcinoma.  Staging. EXAM: CT CHEST WITH CONTRAST TECHNIQUE: Multidetector CT imaging of the chest was performed during intravenous contrast administration. CONTRAST:  63mL OMNIPAQUE IOHEXOL 300 MG/ML  SOLN COMPARISON:  None.  FINDINGS: Cardiovascular: The heart size is normal. No substantial pericardial effusion. Mediastinum/Nodes: Left hilar lymphadenopathy measures up to 2.3 cm short axis. Upper normal mediastinal lymph nodes evident. The esophagus has normal imaging features. There is no axillary lymphadenopathy. Lungs/Pleura: Multiple bilateral pulmonary nodules measure up to a maximum size of approximately 10 mm in the right middle lobe. Cluster of tree-in-bud nodularity in the left base likely reflects atypical infection. No pleural effusion. Upper Abdomen: Unremarkable. Musculoskeletal: No worrisome lytic or sclerotic osseous abnormality. IMPRESSION: 1. Numerous bilateral pulmonary nodules measuring up to 10 mm diameter. Imaging features compatible with metastatic disease. 2. Left hilar lymphadenopathy, also highly suspicious for metastatic involvement. Electronically Signed   By: Misty Stanley M.D.   On: 05/28/2019 13:14   NM Bone Scan Whole Body  Result Date: 05/29/2019 CLINICAL DATA:  Bladder tumor, history of clear cell carcinoma of the RIGHT kidney EXAM: NUCLEAR MEDICINE WHOLE BODY BONE SCAN TECHNIQUE: Whole body anterior and posterior images were obtained approximately 3 hours after intravenous injection of radiopharmaceutical. RADIOPHARMACEUTICALS:  22.457 mCi Technetium-62m MDP IV COMPARISON:  None Correlation: CT chest 05/28/2019, CT abdomen and pelvis 04/30/2019 FINDINGS: Uptake at the shoulders, sternoclavicular joints, and knees, typically degenerative. Scattered physiologic uptake within costal cartilage. No worrisome sites of osseous tracer accumulation are identified to suggest osseous metastatic disease. Expected urinary tract and soft tissue distribution of tracer. IMPRESSION: No scintigraphic evidence of osseous metastatic disease. Electronically Signed   By: Lavonia Dana M.D.   On: 05/29/2019 08:21      ASSESSMENT & PLAN:  1. Clear cell carcinoma of right kidney ([place])   2. Renal cell carcinoma of right  kidney metastatic to other site [facility])   3. 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 Status post cycle 1 Keytruda.  Patient has also started on axitinib and so far tolerating well. Labs reviewed and discussed with patient. Clinically he is doing well.  Continue current regimen. Mild epistaxis, may be due to dry air.  Advised patient to either use humidifier or use saline gel to moisturize his nose.  Continue observation.   We spent sufficient time to discuss many aspect of care, questions were answered to patient's satisfaction.  All questions were answered. The patient knows to call the clinic with any problems questions or concerns.  cc [name], NP    Return of visit: 2 weeks    Earlie Server, MD, PhD Hematology Oncology [facility] Pager- [phone] 06/13/2019

## 2019-06-13 NOTE — Progress Notes (Signed)
Patient reports having 2 nose bleeds this week.

## 2019-06-13 NOTE — Telephone Encounter (Signed)
Faxed FMLA forms for patient's wife to [place] today.

## 2019-06-16 ENCOUNTER — Encounter: Payer: Self-pay | Admitting: Oncology

## 2019-06-19 ENCOUNTER — Other Ambulatory Visit: Payer: BC Managed Care – PPO

## 2019-06-19 ENCOUNTER — Ambulatory Visit: Payer: BC Managed Care – PPO

## 2019-06-19 ENCOUNTER — Ambulatory Visit: Payer: BC Managed Care – PPO | Admitting: Oncology

## 2019-06-26 ENCOUNTER — Inpatient Hospital Stay: Payer: BC Managed Care – PPO

## 2019-06-26 ENCOUNTER — Other Ambulatory Visit: Payer: Self-pay | Admitting: Oncology

## 2019-06-26 ENCOUNTER — Other Ambulatory Visit: Payer: Self-pay | Admitting: Pharmacist

## 2019-06-26 ENCOUNTER — Encounter: Payer: Self-pay | Admitting: Oncology

## 2019-06-26 ENCOUNTER — Other Ambulatory Visit: Payer: Self-pay

## 2019-06-26 ENCOUNTER — Inpatient Hospital Stay (HOSPITAL_BASED_OUTPATIENT_CLINIC_OR_DEPARTMENT_OTHER): Payer: BC Managed Care – PPO | Admitting: Oncology

## 2019-06-26 ENCOUNTER — Inpatient Hospital Stay: Payer: BC Managed Care – PPO | Attending: Oncology

## 2019-06-26 VITALS — BP 147/89 | HR 69 | Temp 97.4°F | Resp 18 | Wt 245.0 lb

## 2019-06-26 DIAGNOSIS — K219 Gastro-esophageal reflux disease without esophagitis: Secondary | ICD-10-CM | POA: Diagnosis not present

## 2019-06-26 DIAGNOSIS — I10 Essential (primary) hypertension: Secondary | ICD-10-CM | POA: Insufficient documentation

## 2019-06-26 DIAGNOSIS — R5383 Other fatigue: Secondary | ICD-10-CM | POA: Insufficient documentation

## 2019-06-26 DIAGNOSIS — L409 Psoriasis, unspecified: Secondary | ICD-10-CM | POA: Insufficient documentation

## 2019-06-26 DIAGNOSIS — Z79899 Other long term (current) drug therapy: Secondary | ICD-10-CM | POA: Insufficient documentation

## 2019-06-26 DIAGNOSIS — Z7982 Long term (current) use of aspirin: Secondary | ICD-10-CM | POA: Diagnosis not present

## 2019-06-26 DIAGNOSIS — C641 Malignant neoplasm of right kidney, except renal pelvis: Secondary | ICD-10-CM

## 2019-06-26 DIAGNOSIS — C7911 Secondary malignant neoplasm of bladder: Secondary | ICD-10-CM | POA: Diagnosis not present

## 2019-06-26 DIAGNOSIS — C7800 Secondary malignant neoplasm of unspecified lung: Secondary | ICD-10-CM | POA: Diagnosis not present

## 2019-06-26 DIAGNOSIS — Z7984 Long term (current) use of oral hypoglycemic drugs: Secondary | ICD-10-CM | POA: Diagnosis not present

## 2019-06-26 DIAGNOSIS — Z905 Acquired absence of kidney: Secondary | ICD-10-CM | POA: Insufficient documentation

## 2019-06-26 DIAGNOSIS — Z5112 Encounter for antineoplastic immunotherapy: Secondary | ICD-10-CM | POA: Diagnosis present

## 2019-06-26 DIAGNOSIS — Z791 Long term (current) use of non-steroidal anti-inflammatories (NSAID): Secondary | ICD-10-CM | POA: Diagnosis not present

## 2019-06-26 DIAGNOSIS — Z87891 Personal history of nicotine dependence: Secondary | ICD-10-CM | POA: Diagnosis not present

## 2019-06-26 DIAGNOSIS — R519 Headache, unspecified: Secondary | ICD-10-CM | POA: Diagnosis not present

## 2019-06-26 DIAGNOSIS — E119 Type 2 diabetes mellitus without complications: Secondary | ICD-10-CM | POA: Insufficient documentation

## 2019-06-26 DIAGNOSIS — Z5111 Encounter for antineoplastic chemotherapy: Secondary | ICD-10-CM | POA: Diagnosis not present

## 2019-06-26 DIAGNOSIS — E785 Hyperlipidemia, unspecified: Secondary | ICD-10-CM | POA: Diagnosis not present

## 2019-06-26 LAB — CBC WITH DIFFERENTIAL/PLATELET
Abs Immature Granulocytes: 0.02 10*3/uL (ref 0.00–0.07)
Basophils Absolute: 0 10*3/uL (ref 0.0–0.1)
Basophils Relative: 1 %
Eosinophils Absolute: 0.1 10*3/uL (ref 0.0–0.5)
Eosinophils Relative: 1 %
HCT: 46.9 % (ref 39.0–52.0)
Hemoglobin: 15.5 g/dL (ref 13.0–17.0)
Immature Granulocytes: 0 %
Lymphocytes Relative: 33 %
Lymphs Abs: 2.1 10*3/uL (ref 0.7–4.0)
MCH: 30.3 pg (ref 26.0–34.0)
MCHC: 33 g/dL (ref 30.0–36.0)
MCV: 91.8 fL (ref 80.0–100.0)
Monocytes Absolute: 0.5 10*3/uL (ref 0.1–1.0)
Monocytes Relative: 8 %
Neutro Abs: 3.6 10*3/uL (ref 1.7–7.7)
Neutrophils Relative %: 57 %
Platelets: 200 10*3/uL (ref 150–400)
RBC: 5.11 MIL/uL (ref 4.22–5.81)
RDW: 11.8 % (ref 11.5–15.5)
WBC: 6.3 10*3/uL (ref 4.0–10.5)
nRBC: 0 % (ref 0.0–0.2)

## 2019-06-26 LAB — COMPREHENSIVE METABOLIC PANEL
ALT: 20 U/L (ref 0–44)
AST: 22 U/L (ref 15–41)
Albumin: 4.1 g/dL (ref 3.5–5.0)
Alkaline Phosphatase: 71 U/L (ref 38–126)
Anion gap: 10 (ref 5–15)
BUN: 21 mg/dL — ABNORMAL HIGH (ref 6–20)
CO2: 26 mmol/L (ref 22–32)
Calcium: 9.1 mg/dL (ref 8.9–10.3)
Chloride: 97 mmol/L — ABNORMAL LOW (ref 98–111)
Creatinine, Ser: 1.25 mg/dL — ABNORMAL HIGH (ref 0.61–1.24)
GFR calc Af Amer: >60 mL/min (ref >60)
GFR calc non Af Amer: >60 mL/min (ref >60)
Glucose, Bld: 210 mg/dL — ABNORMAL HIGH (ref 70–99)
Potassium: 4.6 mmol/L (ref 3.5–5.1)
Sodium: 133 mmol/L — ABNORMAL LOW (ref 135–145)
Total Bilirubin: 0.7 mg/dL (ref 0.3–1.2)
Total Protein: 7.4 g/dL (ref 6.5–8.1)

## 2019-06-26 LAB — PROTEIN, URINE, RANDOM: Total Protein, Urine: 19 mg/dL

## 2019-06-26 MED ORDER — SODIUM CHLORIDE 0.9 % IV SOLN
Freq: Once | INTRAVENOUS | Status: AC
  Filled 2019-06-26: qty 250

## 2019-06-26 MED ORDER — SODIUM CHLORIDE 0.9 % IV SOLN
200.0000 mg | Freq: Once | INTRAVENOUS | Status: AC
  Administered 2019-06-26: 200 mg via INTRAVENOUS
  Filled 2019-06-26: qty 8

## 2019-06-26 MED ORDER — AXITINIB 1 MG PO TABS: 2.0000 mg | ORAL_TABLET | Freq: Two times a day (BID) | ORAL | 0 refills | Status: DC

## 2019-06-26 NOTE — Progress Notes (Signed)
Hematology/Oncology follow up note [facility] [phone] [phone]   Patient Care Team: [name], NP as PCP - General [name], [name], MD as PCP - Family Medicine (Family Medicine) [name], MD as Consulting Physician (General Surgery) [name], [name], MD as Attending Physician (Family Medicine) [name], [name], MD as Consulting Physician (Dermatology)  REFERRING PROVIDER: [name], NP  CHIEF COMPLAINTS/REASON FOR VISIT:  Evaluation of kidney cancer  HISTORY OF PRESENTING ILLNESS:   Matthew Woodard is a  54 y.o.  male with PMH listed below was seen in consultation at the request of  [name], NP  for evaluation of kidney cancer Patient's cancer history dated back to February 2020 when he developed gross hematuria with small clots and right-sided flank pain.  Patient had a CT urogram done which showed right 4 cm central enhancing renal mass with invasion into the collecting system.  X-ray of chest was performed to see complete staging which showed no concerning findings. 06/28/2018 patient underwent a cystoscopy, bladder biopsy and a right retrograde pyelogram with intraoperative interpretation, right diagnostic ureteroscopy, right renal pelvis biopsy, right ureteral stent placement.  biopsy showed atypical cell clusters, with extensive crush artifact, nondiagnostic.   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  Surgical margins negative for tumor.pT3a,Nx Patient has been on surveillance after surgery. 10/28/2018 CT abdomen pelvis with contrast showed minimal fluid and or postoperative changes within the right renal fossa.  No evidence of metastatic disease or recurrence.  Patient has left kidney lesion previously characterized as  nonenhancing cyst by multi phasic contrast-enhanced CT. Patient reports an episode of gross hematuria in early December 2020, patient is due for 10-month surveillance CT scan. 04/30/2019 CT abdomen pelvis with contrast showed high attenuation mass at the right uretero vesicle junction, with extension into the bladder.  Bilateral pulmonary nodules, most indicated for metastatic disease.   Patient underwent cystoscopy and transurethral resection of irregular bladder tumor 3 cm.  Mass appeared to emanate from the right ureteral orifice.  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 Patient was referred to me for further work-up and management.  #Staging chest CT with contrast showed numerous bilateral pulmonary nodules.  Compatible with metastatic disease. Bone scan showed no osseous metastatic disease.   #History of tobacco abuse, former smoker.  Quit in 2015.  He denies any hemoptysis, chest pain, shortness of breath today. Patient reports that hematuria has completely resolved.  Denies any pain today. He is accompanied by his wife.  #History of cutaneous psoriasis, mostly on his right hand, currently on weekly methotrexate 12.5 mg with good symptom control. #NGS: [facility] PD L1 TPS 1%  INTERVAL HISTORY Matthew Woodard is a 54 y.o. male who has above history reviewed by me today presents for follow up visit for management of metastatic RCC. Problems and complaints are listed below: Patient was accompanied by wife. Reports feeling fatigue, no new complaints.  No additional epistaxis episodes.  No hematuria  Review of Systems  Constitutional: Negative for appetite change, chills, fatigue, fever and unexpected weight change.  HENT:   Negative for hearing loss and voice change.   Eyes: Negative for eye problems and icterus.  Respiratory: Negative for chest tightness, cough and shortness of breath.   Cardiovascular: Negative for chest pain and  leg swelling.  Gastrointestinal: Negative for abdominal distention and abdominal pain.  Endocrine: Negative for hot flashes.  Genitourinary: Negative for difficulty urinating, dysuria and frequency.   Musculoskeletal: Negative for arthralgias.  Skin: Negative for itching and rash.  Neurological: Negative for light-headedness and numbness.  Hematological: Negative for adenopathy. Does not bruise/bleed easily.  Psychiatric/Behavioral: Negative for confusion.    MEDICAL HISTORY:  Past Medical History:  Diagnosis Date  . Arthritis    hands, left knee  . Cancer ([place])   . Diabetes mellitus without complication ([place])   . Eczema 01/08/2017  . GERD (gastroesophageal reflux disease)   . History of kidney cancer   . Hyperlipidemia LDL goal <100 11/02/2014  . Hypertension   . Kidney stone    YEAR H/O HEMATURIA  . Psoriasis   . Wears dentures    full upper, partial lower.  Has, does not wear    SURGICAL HISTORY: Past Surgical History:  Procedure Laterality Date  . COLONOSCOPY WITH PROPOFOL N/A 02/23/2017   Procedure: COLONOSCOPY WITH PROPOFOL;  Surgeon: [name], MD;  Location: [place];  Service: Endoscopy;  Laterality: N/A;  Diabetic - oral meds  . CYST EXCISION  Aug. 2016   on back  . CYSTOSCOPY W/ RETROGRADES Right 06/28/2018   Procedure: CYSTOSCOPY WITH RETROGRADE PYELOGRAM;  Surgeon: Billey Co, MD;  Location: [facility];  Service: Urology;  Laterality: Right;  . CYSTOSCOPY W/ URETERAL STENT REMOVAL Right 07/22/2018   Procedure: CYSTOSCOPY WITH STENT REMOVAL;  Surgeon: Billey Co, MD;  Location: [facility];  Service: Urology;  Laterality: Right;  . CYSTOSCOPY WITH BIOPSY Right 06/28/2018   Procedure: CYSTOSCOPY WITH RENAL BIOPSY;  Surgeon: Billey Co, MD;  Location: [facility];  Service: Urology;  Laterality: Right;  . CYSTOSCOPY WITH URETEROSCOPY AND STENT PLACEMENT Right 06/28/2018   Procedure: CYSTOSCOPY WITH URETEROSCOPY AND STENT PLACEMENT;  Surgeon:  Billey Co, MD;  Location: [facility];  Service: Urology;  Laterality: Right;  . FULGURATION OF BLADDER TUMOR N/A 06/28/2018   Procedure: CYSTOSCOPY BLADDER BIOPSY, FULGERATION OF BLADDER;  Surgeon: Billey Co, MD;  Location: [facility];  Service: Urology;  Laterality: N/A;  . KNEE SURGERY Left   . LAPAROSCOPIC NEPHRECTOMY, HAND ASSISTED Right 07/22/2018   Procedure: HAND ASSISTED LAPAROSCOPIC NEPHRECTOMY;  Surgeon: Billey Co, MD;  Location: [facility];  Service: Urology;  Laterality: Right;  . NASAL SINUS SURGERY  03/15  . POLYPECTOMY N/A 02/23/2017   Procedure: POLYPECTOMY;  Surgeon: [name], MD;  Location: [place];  Service: Endoscopy;  Laterality: N/A;  . tooth abstraction    . TRANSURETHRAL RESECTION OF BLADDER TUMOR N/A 05/12/2019   Procedure: TRANSURETHRAL RESECTION OF BLADDER TUMOR (TURBT);  Surgeon: Billey Co, MD;  Location: [facility];  Service: Urology;  Laterality: N/A;  . VARICOCELE EXCISION      SOCIAL HISTORY: Social History   Socioeconomic History  . Marital status: Married    Spouse name: Not on file  . Number of children: Not on file  . Years of education: Not on file  . Highest education level: Not on file  Occupational History  . Not on file  Tobacco Use  . Smoking status: Former Smoker    Types: Cigarettes    Quit date: 06/15/2013    Years since quitting: 6.0  . Smokeless tobacco: Never Used  Substance and Sexual Activity  . Alcohol use: No    Alcohol/week: 0.0 standard drinks  . Drug use: No  . Sexual activity: Yes  Other Topics Concern  . Not on file  Social History Narrative  . Not on file   Social Determinants of Health   Financial Resource Strain:   . Difficulty of Paying Living Expenses: Not on file  Food Insecurity:   . Worried About Charity fundraiser in the Last Year: Not on file  . Ran Out of Food in the Last Year: Not on file  Transportation Needs:   . Lack of Transportation (Medical): Not on file  .  Lack of Transportation (Non-Medical): Not on file  Physical Activity:   . Days of Exercise per Week: Not on file  . Minutes of Exercise per Session: Not on file  Stress:   . Feeling of Stress : Not on file  Social Connections:   . Frequency of Communication with Friends and Family: Not on file  . Frequency of Social Gatherings with Friends and Family: Not on file  . Attends Religious Services: Not on file  . Active Member of Clubs or Organizations: Not on file  . Attends Archivist Meetings: Not on file  . Marital Status: Not on file  Intimate Partner Violence:   . Fear of Current or Ex-Partner: Not on file  . Emotionally Abused: Not on file  . Physically Abused: Not on file  . Sexually Abused: Not on file    FAMILY HISTORY: Family History  Problem Relation Age of Onset  . Heart disease Father   . Hypertension Father   . COPD Father   . Colon cancer Maternal Grandmother   . Heart attack Paternal Grandfather     ALLERGIES:  is allergic to glucotrol [glipizide].  MEDICATIONS:  Current Outpatient Medications  Medication Sig Dispense Refill  . aspirin EC 81 MG tablet Take 81 mg by mouth daily.    Marland Kitchen axitinib (INLYTA) 5 MG tablet Take 1 tablet (5 mg total) by mouth 2 (two) times daily. 60 tablet 0  . [place]-Glucosamine-Vit D (OSTEO BI-FLEX ONE PER DAY) TABS Take 1 tablet by mouth daily.    . clobetasol cream (TEMOVATE) [id number] % Apply 1 application topically 2 (two) times daily. To affected skin; too strong for face, groin, underarms (Patient taking differently: Apply 1 application topically daily as needed (irritation). ) 30 g 2  . Glucose Blood (ACCU-CHEK AVIVA PLUS VI) by In Vitro route.    Marland Kitchen JARDIANCE 10 MG TABS tablet TAKE 1 TABLET BY MOUTH DAILY (Patient taking differently: Take 10 mg by mouth daily. ) 30 tablet 2  . lisinopril (PRINIVIL,ZESTRIL) 10 MG tablet TAKE 1 TABLET(10 MG) BY MOUTH DAILY (Patient taking differently: Take 10 mg by mouth daily. ) 90 tablet 1    . meloxicam (MOBIC) 15 MG tablet Take 15 mg by mouth daily.     . metFORMIN (GLUCOPHAGE-XR) 500 MG 24 hr tablet Take 3 pills by mouth daily for 4-5 days, then increase to 4 pills daily (if tolerated) (Patient taking differently: Take 2,000 mg by mouth daily with breakfast. ) 360 tablet 0  . Multiple Vitamin (MULTIVITAMIN) tablet Take 1 tablet by mouth daily.    . Omega-3 Fatty Acids (FISH OIL) 1200 MG CAPS Take 1,200 mg by mouth daily.     . Omeprazole (PRILOSEC PO) Take 20 mg by mouth daily as needed (heart burn).     Marland Kitchen OZEMPIC, 0.25 OR 0.5 MG/DOSE, 2 MG/1.5ML SOPN     . pioglitazone (ACTOS) 45 MG tablet TAKE 1 TABLET(45 MG) BY MOUTH DAILY (Patient taking differently: Take 45 mg by  mouth daily. ) 90 tablet 1  . prochlorperazine (COMPAZINE) 10 MG tablet Take 1 tablet (10 mg total) by mouth every 6 (six) hours as needed for nausea or vomiting. 60 tablet 0  . rosuvastatin (CRESTOR) 10 MG tablet Take 1 tablet (10 mg total) by mouth at bedtime. 90 tablet 1  . loperamide (IMODIUM) 2 MG capsule Take 1 capsule (2 mg total) by mouth See admin instructions. With onset of loose stool, take 4mg  followed by 2mg  every 2 hours until 12 hours have passed without loose bowel movement. Maximum: 16 mg/day (Patient not taking: Reported on 06/26/2019) 60 capsule 0   No current facility-administered medications for this visit.     PHYSICAL EXAMINATION: ECOG PERFORMANCE STATUS: 0 - Asymptomatic Vitals:   06/26/19 0950  BP: (!) 147/89  Pulse: 69  Resp: 18  Temp: (!) 97.4 F (36.3 C)   Filed Weights   06/26/19 0950  Weight: 245 lb (111.1 kg)    Physical Exam Constitutional:      General: He is not in acute distress. HENT:     Head: Normocephalic and atraumatic.  Eyes:     General: No scleral icterus.    Pupils: Pupils are equal, round, and reactive to light.  Cardiovascular:     Rate and Rhythm: Normal rate and regular rhythm.     Heart sounds: Normal heart sounds.  Pulmonary:     Effort:  Pulmonary effort is normal. No respiratory distress.     Breath sounds: No wheezing.  Abdominal:     General: Bowel sounds are normal. There is no distension.     Palpations: Abdomen is soft. There is no mass.     Tenderness: There is no abdominal tenderness.  Musculoskeletal:        General: No deformity. Normal range of motion.     Cervical back: Normal range of motion and neck supple.  Skin:    General: Skin is warm and dry.     Findings: No erythema or rash.  Neurological:     Mental Status: He is alert and oriented to person, place, and time. Mental status is at baseline.     Cranial Nerves: No cranial nerve deficit.     Coordination: Coordination normal.  Psychiatric:        Mood and Affect: Mood normal.        Behavior: Behavior normal.        Thought Content: Thought content normal.     LABORATORY DATA:  I have reviewed the data as listed Lab Results  Component Value Date   WBC 6.3 06/26/2019   HGB 15.5 06/26/2019   HCT 46.9 06/26/2019   MCV 91.8 06/26/2019   PLT 200 06/26/2019   Recent Labs    06/05/19 0851 06/13/19 1005 06/26/19 0838  NA 132* 134* 133*  K 4.9 4.3 4.6  CL 99 101 97*  CO2 26 24 26   GLUCOSE 304* 284* 210*  BUN 32* 22* 21*  CREATININE 1.29* 1.30* 1.25*  CALCIUM 9.4 9.1 9.1  GFRNONAA >60 >60 >60  GFRAA >60 >60 >60  PROT 6.9 7.2 7.4  ALBUMIN 4.0 4.1 4.1  AST 19 26 22   ALT 16 21 20   ALKPHOS 63 72 71  BILITOT 0.6 0.6 0.7   Iron/TIBC/Ferritin/ %Sat No results found for: IRON, TIBC, FERRITIN, IRONPCTSAT    RADIOGRAPHIC STUDIES: I have personally reviewed the radiological images as listed and agreed with the findings in the report.  CT Chest W Contrast  Result Date:  05/28/2019 CLINICAL DATA:  Renal cell carcinoma.  Staging. EXAM: CT CHEST WITH CONTRAST TECHNIQUE: Multidetector CT imaging of the chest was performed during intravenous contrast administration. CONTRAST:  65mL OMNIPAQUE IOHEXOL 300 MG/ML  SOLN COMPARISON:  None. FINDINGS:  Cardiovascular: The heart size is normal. No substantial pericardial effusion. Mediastinum/Nodes: Left hilar lymphadenopathy measures up to 2.3 cm short axis. Upper normal mediastinal lymph nodes evident. The esophagus has normal imaging features. There is no axillary lymphadenopathy. Lungs/Pleura: Multiple bilateral pulmonary nodules measure up to a maximum size of approximately 10 mm in the right middle lobe. Cluster of tree-in-bud nodularity in the left base likely reflects atypical infection. No pleural effusion. Upper Abdomen: Unremarkable. Musculoskeletal: No worrisome lytic or sclerotic osseous abnormality. IMPRESSION: 1. Numerous bilateral pulmonary nodules measuring up to 10 mm diameter. Imaging features compatible with metastatic disease. 2. Left hilar lymphadenopathy, also highly suspicious for metastatic involvement. Electronically Signed   By: Misty Stanley M.D.   On: 05/28/2019 13:14   NM Bone Scan Whole Body  Result Date: 05/29/2019 CLINICAL DATA:  Bladder tumor, history of clear cell carcinoma of the RIGHT kidney EXAM: NUCLEAR MEDICINE WHOLE BODY BONE SCAN TECHNIQUE: Whole body anterior and posterior images were obtained approximately 3 hours after intravenous injection of radiopharmaceutical. RADIOPHARMACEUTICALS:  22.457 mCi Technetium-72m MDP IV COMPARISON:  None Correlation: CT chest 05/28/2019, CT abdomen and pelvis 04/30/2019 FINDINGS: Uptake at the shoulders, sternoclavicular joints, and knees, typically degenerative. Scattered physiologic uptake within costal cartilage. No worrisome sites of osseous tracer accumulation are identified to suggest osseous metastatic disease. Expected urinary tract and soft tissue distribution of tracer. IMPRESSION: No scintigraphic evidence of osseous metastatic disease. Electronically Signed   By: Lavonia Dana M.D.   On: 05/29/2019 08:21      ASSESSMENT & PLAN:  1. Clear cell carcinoma of right kidney ([place])   2. Renal cell carcinoma of right kidney  metastatic to other site ([place])   3. Encounter for antineoplastic immunotherapy   4. Encounter for antineoplastic chemotherapy   5. 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 Status post cycle 1 Keytruda.  Patient has also started on axitinib and so far tolerating well. Labs are reviewed and discussed with patient. Proceed with Keytruda today.  Plan to increase Axitinib to 7mg  BID after 6 weeks.   Fatigue, can be related to Axitinib and Keytruda treatment. Follow up on TSH.   We spent sufficient time to discuss many aspect of care, questions were answered to patient's satisfaction.  All questions were answered. The patient knows to call the clinic with any problems questions or concerns.  Return of visit: 2 weeks    Earlie Server, MD, PhD Hematology Oncology [facility] at [facility] Pager- [phone] 06/26/2019

## 2019-06-26 NOTE — Progress Notes (Signed)
Patient does not offer any problems today.  

## 2019-07-10 ENCOUNTER — Inpatient Hospital Stay (HOSPITAL_BASED_OUTPATIENT_CLINIC_OR_DEPARTMENT_OTHER): Payer: BC Managed Care – PPO | Admitting: Oncology

## 2019-07-10 ENCOUNTER — Encounter: Payer: Self-pay | Admitting: Oncology

## 2019-07-10 ENCOUNTER — Other Ambulatory Visit: Payer: Self-pay | Admitting: Oncology

## 2019-07-10 ENCOUNTER — Other Ambulatory Visit: Payer: Self-pay

## 2019-07-10 ENCOUNTER — Inpatient Hospital Stay: Payer: BC Managed Care – PPO

## 2019-07-10 VITALS — BP 160/88 | HR 90 | Temp 97.1°F | Resp 18 | Wt 240.4 lb

## 2019-07-10 DIAGNOSIS — C641 Malignant neoplasm of right kidney, except renal pelvis: Secondary | ICD-10-CM

## 2019-07-10 DIAGNOSIS — R519 Headache, unspecified: Secondary | ICD-10-CM | POA: Diagnosis not present

## 2019-07-10 DIAGNOSIS — I10 Essential (primary) hypertension: Secondary | ICD-10-CM

## 2019-07-10 DIAGNOSIS — Z5112 Encounter for antineoplastic immunotherapy: Secondary | ICD-10-CM | POA: Diagnosis not present

## 2019-07-10 LAB — CBC WITH DIFFERENTIAL/PLATELET
Abs Immature Granulocytes: 0.03 10*3/uL (ref 0.00–0.07)
Basophils Absolute: 0 10*3/uL (ref 0.0–0.1)
Basophils Relative: 1 %
Eosinophils Absolute: 0.1 10*3/uL (ref 0.0–0.5)
Eosinophils Relative: 1 %
HCT: 46.3 % (ref 39.0–52.0)
Hemoglobin: 15.5 g/dL (ref 13.0–17.0)
Immature Granulocytes: 0 %
Lymphocytes Relative: 35 %
Lymphs Abs: 2.7 10*3/uL (ref 0.7–4.0)
MCH: 30.3 pg (ref 26.0–34.0)
MCHC: 33.5 g/dL (ref 30.0–36.0)
MCV: 90.6 fL (ref 80.0–100.0)
Monocytes Absolute: 0.6 10*3/uL (ref 0.1–1.0)
Monocytes Relative: 8 %
Neutro Abs: 4.1 10*3/uL (ref 1.7–7.7)
Neutrophils Relative %: 55 %
Platelets: 232 10*3/uL (ref 150–400)
RBC: 5.11 MIL/uL (ref 4.22–5.81)
RDW: 11.7 % (ref 11.5–15.5)
WBC: 7.5 10*3/uL (ref 4.0–10.5)
nRBC: 0 % (ref 0.0–0.2)

## 2019-07-10 LAB — COMPREHENSIVE METABOLIC PANEL
ALT: 23 U/L (ref 0–44)
AST: 25 U/L (ref 15–41)
Albumin: 4.1 g/dL (ref 3.5–5.0)
Alkaline Phosphatase: 63 U/L (ref 38–126)
Anion gap: 8 (ref 5–15)
BUN: 23 mg/dL — ABNORMAL HIGH (ref 6–20)
CO2: 25 mmol/L (ref 22–32)
Calcium: 9.4 mg/dL (ref 8.9–10.3)
Chloride: 103 mmol/L (ref 98–111)
Creatinine, Ser: 1.16 mg/dL (ref 0.61–1.24)
GFR calc Af Amer: >60 mL/min (ref >60)
GFR calc non Af Amer: >60 mL/min (ref >60)
Glucose, Bld: 114 mg/dL — ABNORMAL HIGH (ref 70–99)
Potassium: 4.4 mmol/L (ref 3.5–5.1)
Sodium: 136 mmol/L (ref 135–145)
Total Bilirubin: 0.7 mg/dL (ref 0.3–1.2)
Total Protein: 7.6 g/dL (ref 6.5–8.1)

## 2019-07-10 MED ORDER — AMLODIPINE BESYLATE 2.5 MG PO TABS: 2.5000 mg | ORAL_TABLET | Freq: Every day | ORAL | 0 refills | Status: DC

## 2019-07-10 NOTE — Progress Notes (Signed)
Hematology/Oncology follow up note [facility] [phone] [phone]   Patient Care Team: [name], NP as PCP - General [name], [name], MD as PCP - Family Medicine (Family Medicine) [name], MD as Consulting Physician (General Surgery) [name], [name], MD as Attending Physician (Family Medicine) [name], [name], MD as Consulting Physician (Dermatology)  REFERRING PROVIDER: [name], NP  CHIEF COMPLAINTS/REASON FOR VISIT:  Evaluation of kidney cancer  HISTORY OF PRESENTING ILLNESS:   Matthew Woodard is a  54 y.o.  male with PMH listed below was seen in consultation at the request of  [name], NP  for evaluation of kidney cancer Patient's cancer history dated back to February 2020 when he developed gross hematuria with small clots and right-sided flank pain.  Patient had a CT urogram done which showed right 4 cm central enhancing renal mass with invasion into the collecting system.  X-ray of chest was performed to see complete staging which showed no concerning findings. 06/28/2018 patient underwent a cystoscopy, bladder biopsy and a right retrograde pyelogram with intraoperative interpretation, right diagnostic ureteroscopy, right renal pelvis biopsy, right ureteral stent placement.  biopsy showed atypical cell clusters, with extensive crush artifact, nondiagnostic.   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  Surgical margins negative for tumor.pT3a,Nx Patient has been on surveillance after surgery. 10/28/2018 CT abdomen pelvis with contrast showed minimal fluid and or postoperative changes within the right renal fossa.  No evidence of metastatic disease or recurrence.  Patient has left kidney lesion previously characterized as  nonenhancing cyst by multi phasic contrast-enhanced CT. Patient reports an episode of gross hematuria in early December 2020, patient is due for 50-month surveillance CT scan. 04/30/2019 CT abdomen pelvis with contrast showed high attenuation mass at the right uretero vesicle junction, with extension into the bladder.  Bilateral pulmonary nodules, most indicated for metastatic disease.   Patient underwent cystoscopy and transurethral resection of irregular bladder tumor 3 cm.  Mass appeared to emanate from the right ureteral orifice.  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 Patient was referred to me for further work-up and management.  #Staging chest CT with contrast showed numerous bilateral pulmonary nodules.  Compatible with metastatic disease. Bone scan showed no osseous metastatic disease.   #History of tobacco abuse, former smoker.  Quit in 2015.  He denies any hemoptysis, chest pain, shortness of breath today. Patient reports that hematuria has completely resolved.  Denies any pain today. He is accompanied by his wife.  #History of cutaneous psoriasis, mostly on his right hand, currently on weekly methotrexate 12.5 mg with good symptom control. #NGS: [facility] PD L1 TPS 1%  INTERVAL HISTORY MONDALE DUTCHER Woodard is a 54 y.o. male who has above history reviewed by me today presents for follow up visit for management of metastatic RCC, therapy side effects.  Problems and complaints are listed below: Patient was accompanied by wife. Patient reports that he has been having headache lately so he meatured his blood pressure and it was 160/100 Denies any blurred vision, focal deficit, nausea, vomiting  Review of Systems  Constitutional: Negative for appetite change, chills, fatigue, fever and unexpected weight change.  HENT:   Negative for hearing loss and voice change.   Eyes: Negative for eye problems and icterus.  Respiratory: Negative for  chest  tightness, cough and shortness of breath.   Cardiovascular: Negative for chest pain and leg swelling.  Gastrointestinal: Negative for abdominal distention, abdominal pain, blood in stool and nausea.  Endocrine: Negative for hot flashes.  Genitourinary: Negative for difficulty urinating, dysuria and frequency.   Musculoskeletal: Negative for arthralgias.  Skin: Negative for itching and rash.  Neurological: Positive for headaches. Negative for extremity weakness, light-headedness and numbness.  Hematological: Negative for adenopathy. Does not bruise/bleed easily.  Psychiatric/Behavioral: Negative for confusion.    MEDICAL HISTORY:  Past Medical History:  Diagnosis Date  . Arthritis    hands, left knee  . Cancer ([place])   . Diabetes mellitus without complication ([place])   . Eczema 01/08/2017  . GERD (gastroesophageal reflux disease)   . History of kidney cancer   . Hyperlipidemia LDL goal <100 11/02/2014  . Hypertension   . Kidney stone    YEAR H/O HEMATURIA  . Psoriasis   . Wears dentures    full upper, partial lower.  Has, does not wear    SURGICAL HISTORY: Past Surgical History:  Procedure Laterality Date  . COLONOSCOPY WITH PROPOFOL N/A 02/23/2017   Procedure: COLONOSCOPY WITH PROPOFOL;  Surgeon: [name], MD;  Location: [facility];  Service: Endoscopy;  Laterality: N/A;  Diabetic - oral meds  . CYST EXCISION  Aug. 2016   on back  . CYSTOSCOPY W/ RETROGRADES Right 06/28/2018   Procedure: CYSTOSCOPY WITH RETROGRADE PYELOGRAM;  Surgeon: Billey Co, MD;  Location: [facility];  Service: Urology;  Laterality: Right;  . CYSTOSCOPY W/ URETERAL STENT REMOVAL Right 07/22/2018   Procedure: CYSTOSCOPY WITH STENT REMOVAL;  Surgeon: Billey Co, MD;  Location: [facility];  Service: Urology;  Laterality: Right;  . CYSTOSCOPY WITH BIOPSY Right 06/28/2018   Procedure: CYSTOSCOPY WITH RENAL BIOPSY;  Surgeon: Billey Co, MD;  Location: [facility];  Service: Urology;   Laterality: Right;  . CYSTOSCOPY WITH URETEROSCOPY AND STENT PLACEMENT Right 06/28/2018   Procedure: CYSTOSCOPY WITH URETEROSCOPY AND STENT PLACEMENT;  Surgeon: Billey Co, MD;  Location: [facility];  Service: Urology;  Laterality: Right;  . FULGURATION OF BLADDER TUMOR N/A 06/28/2018   Procedure: CYSTOSCOPY BLADDER BIOPSY, FULGERATION OF BLADDER;  Surgeon: Billey Co, MD;  Location: [facility];  Service: Urology;  Laterality: N/A;  . KNEE SURGERY Left   . LAPAROSCOPIC NEPHRECTOMY, HAND ASSISTED Right 07/22/2018   Procedure: HAND ASSISTED LAPAROSCOPIC NEPHRECTOMY;  Surgeon: Billey Co, MD;  Location: [facility];  Service: Urology;  Laterality: Right;  . NASAL SINUS SURGERY  03/15  . POLYPECTOMY N/A 02/23/2017   Procedure: POLYPECTOMY;  Surgeon: [name], MD;  Location: [facility];  Service: Endoscopy;  Laterality: N/A;  . tooth abstraction    . TRANSURETHRAL RESECTION OF BLADDER TUMOR N/A 05/12/2019   Procedure: TRANSURETHRAL RESECTION OF BLADDER TUMOR (TURBT);  Surgeon: Billey Co, MD;  Location: [facility];  Service: Urology;  Laterality: N/A;  . VARICOCELE EXCISION      SOCIAL HISTORY: Social History   Socioeconomic History  . Marital status: Married    Spouse name: Not on file  . Number of children: Not on file  . Years of education: Not on file  . Highest education level: Not on file  Occupational History  . Not on file  Tobacco Use  . Smoking status: Former Smoker    Types: Cigarettes    Quit date: 06/15/2013    Years since quitting: 6.0  . Smokeless tobacco: Never Used  Substance  and Sexual Activity  . Alcohol use: No    Alcohol/week: 0.0 standard drinks  . Drug use: No  . Sexual activity: Yes  Other Topics Concern  . Not on file  Social History Narrative  . Not on file   Social Determinants of Health   Financial Resource Strain:   . Difficulty of Paying Living Expenses: Not on file  Food Insecurity:   . Worried About Charity fundraiser  in the Last Year: Not on file  . Ran Out of Food in the Last Year: Not on file  Transportation Needs:   . Lack of Transportation (Medical): Not on file  . Lack of Transportation (Non-Medical): Not on file  Physical Activity:   . Days of Exercise per Week: Not on file  . Minutes of Exercise per Session: Not on file  Stress:   . Feeling of Stress : Not on file  Social Connections:   . Frequency of Communication with Friends and Family: Not on file  . Frequency of Social Gatherings with Friends and Family: Not on file  . Attends Religious Services: Not on file  . Active Member of Clubs or Organizations: Not on file  . Attends Archivist Meetings: Not on file  . Marital Status: Not on file  Intimate Partner Violence:   . Fear of Current or Ex-Partner: Not on file  . Emotionally Abused: Not on file  . Physically Abused: Not on file  . Sexually Abused: Not on file    FAMILY HISTORY: Family History  Problem Relation Age of Onset  . Heart disease Father   . Hypertension Father   . COPD Father   . Colon cancer Maternal Grandmother   . Heart attack Paternal Grandfather     ALLERGIES:  is allergic to glucotrol [glipizide].  MEDICATIONS:  Current Outpatient Medications  Medication Sig Dispense Refill  . aspirin EC 81 MG tablet Take 81 mg by mouth daily.    . [place]-Glucosamine-Vit D (OSTEO BI-FLEX ONE PER DAY) TABS Take 1 tablet by mouth daily.    . clobetasol cream (TEMOVATE) [id number] % Apply 1 application topically 2 (two) times daily. To affected skin; too strong for face, groin, underarms (Patient taking differently: Apply 1 application topically daily as needed (irritation). ) 30 g 2  . Glucose Blood (ACCU-CHEK AVIVA PLUS VI) by In Vitro route.    . INLYTA 5 MG tablet TAKE 1 TABLET BY MOUTH TWICE A DAY (12 HOURS APART) WITH A FULL GLASS OF WATER. MAY TAKE WITH OR WITHOUT FOOD. AVOID GRAPEFRUIT PRODUCTS. 60 tablet 0  . JARDIANCE 10 MG TABS tablet TAKE 1 TABLET BY MOUTH  DAILY (Patient taking differently: Take 10 mg by mouth daily. ) 30 tablet 2  . lisinopril (PRINIVIL,ZESTRIL) 10 MG tablet TAKE 1 TABLET(10 MG) BY MOUTH DAILY (Patient taking differently: Take 10 mg by mouth daily. ) 90 tablet 1  . meloxicam (MOBIC) 15 MG tablet Take 15 mg by mouth daily.     . metFORMIN (GLUCOPHAGE-XR) 500 MG 24 hr tablet Take 3 pills by mouth daily for 4-5 days, then increase to 4 pills daily (if tolerated) (Patient taking differently: Take 2,000 mg by mouth daily with breakfast. ) 360 tablet 0  . Multiple Vitamin (MULTIVITAMIN) tablet Take 1 tablet by mouth daily.    . Omega-3 Fatty Acids (FISH OIL) 1200 MG CAPS Take 1,200 mg by mouth daily.     . pioglitazone (ACTOS) 45 MG tablet TAKE 1 TABLET(45 MG) BY MOUTH DAILY (  Patient taking differently: Take 45 mg by mouth daily. ) 90 tablet 1  . rosuvastatin (CRESTOR) 10 MG tablet Take 1 tablet (10 mg total) by mouth at bedtime. 90 tablet 1  . amLODipine (NORVASC) 2.5 MG tablet Take 1 tablet (2.5 mg total) by mouth daily. 30 tablet 0  . [START ON 07/17/2019] axitinib (INLYTA) 1 MG tablet Take 2 tablets (2 mg total) by mouth 2 (two) times daily. Take with one 5mg  tablet twice daily. (Patient not taking: Reported on 07/10/2019) 120 tablet 0  . loperamide (IMODIUM) 2 MG capsule Take 1 capsule (2 mg total) by mouth See admin instructions. With onset of loose stool, take 4mg  followed by 2mg  every 2 hours until 12 hours have passed without loose bowel movement. Maximum: 16 mg/day (Patient not taking: Reported on 06/26/2019) 60 capsule 0  . Omeprazole (PRILOSEC PO) Take 20 mg by mouth daily as needed (heart burn).     Marland Kitchen OZEMPIC, 0.25 OR 0.5 MG/DOSE, 2 MG/1.5ML SOPN     . prochlorperazine (COMPAZINE) 10 MG tablet Take 1 tablet (10 mg total) by mouth every 6 (six) hours as needed for nausea or vomiting. (Patient not taking: Reported on 07/10/2019) 60 tablet 0   No current facility-administered medications for this visit.     PHYSICAL  EXAMINATION: ECOG PERFORMANCE STATUS: 0 - Asymptomatic Vitals:   07/10/19 1348  BP: (!) 160/88  Pulse: 90  Resp: 18  Temp: (!) 97.1 F (36.2 C)   Filed Weights   07/10/19 1348  Weight: 240 lb 6.4 oz (109 kg)    Physical Exam Constitutional:      General: He is not in acute distress. HENT:     Head: Normocephalic and atraumatic.  Eyes:     General: No scleral icterus.    Pupils: Pupils are equal, round, and reactive to light.  Cardiovascular:     Rate and Rhythm: Normal rate and regular rhythm.     Heart sounds: Normal heart sounds.  Pulmonary:     Effort: Pulmonary effort is normal. No respiratory distress.     Breath sounds: No wheezing.  Abdominal:     General: Bowel sounds are normal. There is no distension.     Palpations: Abdomen is soft. There is no mass.     Tenderness: There is no abdominal tenderness.  Musculoskeletal:        General: No deformity. Normal range of motion.     Cervical back: Normal range of motion and neck supple.  Skin:    General: Skin is warm and dry.     Findings: No erythema or rash.  Neurological:     Mental Status: He is alert and oriented to person, place, and time. Mental status is at baseline.     Cranial Nerves: No cranial nerve deficit.     Coordination: Coordination normal.  Psychiatric:        Mood and Affect: Mood normal.        Behavior: Behavior normal.        Thought Content: Thought content normal.     LABORATORY DATA:  I have reviewed the data as listed Lab Results  Component Value Date   WBC 7.5 07/10/2019   HGB 15.5 07/10/2019   HCT 46.3 07/10/2019   MCV 90.6 07/10/2019   PLT 232 07/10/2019   Recent Labs    06/13/19 1005 06/26/19 0838 07/10/19 1332  NA 134* 133* 136  K 4.3 4.6 4.4  CL 101 97* 103  CO2 24 26  25  GLUCOSE 284* 210* 114*  BUN 22* 21* 23*  CREATININE 1.30* 1.25* 1.16  CALCIUM 9.1 9.1 9.4  GFRNONAA >60 >60 >60  GFRAA >60 >60 >60  PROT 7.2 7.4 7.6  ALBUMIN 4.1 4.1 4.1  AST 26 22 25    ALT 21 20 23   ALKPHOS 72 71 63  BILITOT 0.6 0.7 0.7   Iron/TIBC/Ferritin/ %Sat No results found for: IRON, TIBC, FERRITIN, IRONPCTSAT    RADIOGRAPHIC STUDIES: I have personally reviewed the radiological images as listed and agreed with the findings in the report.  DG Chest 2 View  Result Date: 04/30/2019 CLINICAL DATA:  History of prior right nephrectomy for renal cell carcinoma EXAM: CHEST - 2 VIEW COMPARISON:  10/31/2018 FINDINGS: The heart size and mediastinal contours are within normal limits. Both lungs are clear. The visualized skeletal structures are unremarkable. IMPRESSION: No active cardiopulmonary disease. Electronically Signed   By: Inez Catalina M.D.   On: 04/30/2019 12:57   CT Chest W Contrast  Result Date: 05/28/2019 CLINICAL DATA:  Renal cell carcinoma.  Staging. EXAM: CT CHEST WITH CONTRAST TECHNIQUE: Multidetector CT imaging of the chest was performed during intravenous contrast administration. CONTRAST:  69mL OMNIPAQUE IOHEXOL 300 MG/ML  SOLN COMPARISON:  None. FINDINGS: Cardiovascular: The heart size is normal. No substantial pericardial effusion. Mediastinum/Nodes: Left hilar lymphadenopathy measures up to 2.3 cm short axis. Upper normal mediastinal lymph nodes evident. The esophagus has normal imaging features. There is no axillary lymphadenopathy. Lungs/Pleura: Multiple bilateral pulmonary nodules measure up to a maximum size of approximately 10 mm in the right middle lobe. Cluster of tree-in-bud nodularity in the left base likely reflects atypical infection. No pleural effusion. Upper Abdomen: Unremarkable. Musculoskeletal: No worrisome lytic or sclerotic osseous abnormality. IMPRESSION: 1. Numerous bilateral pulmonary nodules measuring up to 10 mm diameter. Imaging features compatible with metastatic disease. 2. Left hilar lymphadenopathy, also highly suspicious for metastatic involvement. Electronically Signed   By: Misty Stanley M.D.   On: 05/28/2019 13:14   NM Bone  Scan Whole Body  Result Date: 05/29/2019 CLINICAL DATA:  Bladder tumor, history of clear cell carcinoma of the RIGHT kidney EXAM: NUCLEAR MEDICINE WHOLE BODY BONE SCAN TECHNIQUE: Whole body anterior and posterior images were obtained approximately 3 hours after intravenous injection of radiopharmaceutical. RADIOPHARMACEUTICALS:  22.457 mCi Technetium-14m MDP IV COMPARISON:  None Correlation: CT chest 05/28/2019, CT abdomen and pelvis 04/30/2019 FINDINGS: Uptake at the shoulders, sternoclavicular joints, and knees, typically degenerative. Scattered physiologic uptake within costal cartilage. No worrisome sites of osseous tracer accumulation are identified to suggest osseous metastatic disease. Expected urinary tract and soft tissue distribution of tracer. IMPRESSION: No scintigraphic evidence of osseous metastatic disease. Electronically Signed   By: Lavonia Dana M.D.   On: 05/29/2019 08:21   CT Abdomen Pelvis W Contrast  Result Date: 04/30/2019 CLINICAL DATA:  Renal cancer.  Right nephrectomy.  Hematuria. EXAM: CT ABDOMEN AND PELVIS WITH CONTRAST TECHNIQUE: Multidetector CT imaging of the abdomen and pelvis was performed using the standard protocol following bolus administration of intravenous contrast. CONTRAST:  186mL OMNIPAQUE IOHEXOL 300 MG/ML  SOLN COMPARISON:  10/28/2018 and 06/21/2018. FINDINGS: Lower chest: There hematogenously distributed pulmonary nodules in the lung bases bilaterally. Index nodule in the inferior right lower lobe measures 5 mm (5/12). Heart size normal. Coronary artery calcification. No pericardial or pleural effusion. Distal esophagus is unremarkable. Distributed Hepatobiliary: Possible faint subcentimeter low-attenuation lesion in the dome of the liver (2/15). Liver and gallbladder are otherwise unremarkable. No biliary ductal dilatation. Pancreas: There  may be tiny calcifications in the uncinate process of the pancreas, indicative of prior pancreatitis. Otherwise negative.  Spleen: Negative. Adrenals/Urinary Tract: Right adrenal gland is unremarkable. Right nephrectomy with evolving scarring in the surgical bed (2/33). Left adrenal gland is unremarkable. A 2.4 cm hyperdense lesion in the interpolar left kidney is stable and was shown to represent a hyperdense cyst on 06/21/2018. An adjacent well-circumscribed 1.5 cm low-attenuation lesion in the medial interpolar left kidney is similar and likely a cyst. A new hyperattenuating lesion is seen at the right ureterovesical junction, extending into the bladder, measuring approximately 1.4 x 2.0 cm (2/89). Left ureter is decompressed. Bladder is otherwise unremarkable. Stomach/Bowel: Stomach, small bowel, appendix and colon are unremarkable. Vascular/Lymphatic: Atherosclerotic calcification of the aorta without aneurysm. No pathologically enlarged lymph nodes. Reproductive: Prostate is normal in size. Other: No free fluid. Mesenteries and peritoneum are unremarkable. Fatty invagination of the right lateral abdominal wall (2/60), possibly related to prior surgery. Musculoskeletal: No worrisome lytic or sclerotic lesions. IMPRESSION: 1. High attenuation mass at the right ureterovesical junction, with extension into the bladder. Given caliceal extension on [id number], lesion is likely a metastasis. Primary urothelial carcinoma is not excluded. 2. Basilar pulmonary nodules, most indicative of metastatic disease. 3. Aortic atherosclerosis (ICD10-170.0). Coronary artery calcification. Electronically Signed   By: Lorin Picket M.D.   On: 04/30/2019 13:20   DG OR UROLOGY CYSTO IMAGE ([place])  Result Date: 05/12/2019 There is no interpretation for this exam.  This order is for images obtained during a surgical procedure.  Please See "Surgeries" Tab for more information regarding the procedure.      ASSESSMENT & PLAN:  1. Renal cell carcinoma of right kidney metastatic to other site [facility])   2. Uncontrolled hypertension   3. Encounter  for antineoplastic immunotherapy   4. 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 Status post  2 cycles of  Keytruda.   Also on axitinib 5mg  BiD.  Continue. Labs are reviewed and discussed with patient.  #uncontrolled hypertension Discussed with patient that BP elevation is likely due to Axitinib.  He is on Lisinopril 10mg  daily. Recommend him to start on Norvasc 2.5mg  daily, can further titrate dose with BP.   # Headache can be due to high BP. Recommend tylenol 650mg  Q6 hours as needed. If no improved or worsened symptoms, he will update me.   We spent sufficient time to discuss many aspect of care, questions were answered to patient's satisfaction.  All questions were answered. The patient knows to call the clinic with any problems questions or concerns.  Return of visit: keep his appointment with me next week.     Earlie Server, MD, PhD Hematology Oncology [facility] at [facility] Pager- [phone] 07/10/2019

## 2019-07-10 NOTE — Progress Notes (Signed)
Pt complains of persistent headache for approx 1 week. Sometimes headache is so bad that it makes him nauseated. Has checked bp at home and reports it has been elevated. Reports blood pressure at home around 160s/100s. Appetite has been suppressed and has lost about 5 pounds since last visit. Pt reports feeling more tired.

## 2019-07-10 NOTE — Telephone Encounter (Signed)
MD wants pt to come in for lab/MD today, pt agrees.  Will get him on the schedule

## 2019-07-14 ENCOUNTER — Encounter: Payer: Self-pay | Admitting: Oncology

## 2019-07-16 ENCOUNTER — Other Ambulatory Visit: Payer: Self-pay | Admitting: Otolaryngology

## 2019-07-16 DIAGNOSIS — R519 Headache, unspecified: Secondary | ICD-10-CM

## 2019-07-17 ENCOUNTER — Inpatient Hospital Stay (HOSPITAL_BASED_OUTPATIENT_CLINIC_OR_DEPARTMENT_OTHER): Payer: BC Managed Care – PPO | Admitting: Oncology

## 2019-07-17 ENCOUNTER — Encounter: Payer: Self-pay | Admitting: Oncology

## 2019-07-17 ENCOUNTER — Emergency Department
Admission: EM | Admit: 2019-07-17 | Discharge: 2019-07-17 | Disposition: A | Discharge status: Home / Self Care | Payer: BC Managed Care – PPO | Attending: Emergency Medicine | Admitting: Emergency Medicine

## 2019-07-17 ENCOUNTER — Inpatient Hospital Stay: Payer: BC Managed Care – PPO | Attending: Oncology

## 2019-07-17 ENCOUNTER — Other Ambulatory Visit: Payer: Self-pay

## 2019-07-17 ENCOUNTER — Inpatient Hospital Stay: Payer: BC Managed Care – PPO

## 2019-07-17 ENCOUNTER — Emergency Department: Payer: BC Managed Care – PPO

## 2019-07-17 VITALS — BP 145/84 | HR 97 | Temp 95.3°F | Resp 18 | Wt 239.0 lb

## 2019-07-17 DIAGNOSIS — Z791 Long term (current) use of non-steroidal anti-inflammatories (NSAID): Secondary | ICD-10-CM | POA: Insufficient documentation

## 2019-07-17 DIAGNOSIS — Z79899 Other long term (current) drug therapy: Secondary | ICD-10-CM | POA: Diagnosis not present

## 2019-07-17 DIAGNOSIS — R519 Headache, unspecified: Secondary | ICD-10-CM | POA: Diagnosis not present

## 2019-07-17 DIAGNOSIS — C641 Malignant neoplasm of right kidney, except renal pelvis: Secondary | ICD-10-CM | POA: Insufficient documentation

## 2019-07-17 DIAGNOSIS — Z7982 Long term (current) use of aspirin: Secondary | ICD-10-CM | POA: Insufficient documentation

## 2019-07-17 DIAGNOSIS — Z5111 Encounter for antineoplastic chemotherapy: Secondary | ICD-10-CM | POA: Insufficient documentation

## 2019-07-17 DIAGNOSIS — Z7984 Long term (current) use of oral hypoglycemic drugs: Secondary | ICD-10-CM | POA: Insufficient documentation

## 2019-07-17 DIAGNOSIS — E785 Hyperlipidemia, unspecified: Secondary | ICD-10-CM | POA: Insufficient documentation

## 2019-07-17 DIAGNOSIS — C7800 Secondary malignant neoplasm of unspecified lung: Secondary | ICD-10-CM | POA: Insufficient documentation

## 2019-07-17 DIAGNOSIS — Z5112 Encounter for antineoplastic immunotherapy: Secondary | ICD-10-CM | POA: Diagnosis not present

## 2019-07-17 DIAGNOSIS — E1165 Type 2 diabetes mellitus with hyperglycemia: Secondary | ICD-10-CM | POA: Insufficient documentation

## 2019-07-17 DIAGNOSIS — Z87891 Personal history of nicotine dependence: Secondary | ICD-10-CM | POA: Insufficient documentation

## 2019-07-17 DIAGNOSIS — R112 Nausea with vomiting, unspecified: Secondary | ICD-10-CM | POA: Diagnosis not present

## 2019-07-17 DIAGNOSIS — L409 Psoriasis, unspecified: Secondary | ICD-10-CM | POA: Insufficient documentation

## 2019-07-17 DIAGNOSIS — K219 Gastro-esophageal reflux disease without esophagitis: Secondary | ICD-10-CM | POA: Insufficient documentation

## 2019-07-17 DIAGNOSIS — I10 Essential (primary) hypertension: Secondary | ICD-10-CM | POA: Insufficient documentation

## 2019-07-17 DIAGNOSIS — E119 Type 2 diabetes mellitus without complications: Secondary | ICD-10-CM | POA: Diagnosis not present

## 2019-07-17 DIAGNOSIS — D126 Benign neoplasm of colon, unspecified: Secondary | ICD-10-CM | POA: Insufficient documentation

## 2019-07-17 DIAGNOSIS — Z905 Acquired absence of kidney: Secondary | ICD-10-CM | POA: Insufficient documentation

## 2019-07-17 DIAGNOSIS — R040 Epistaxis: Secondary | ICD-10-CM

## 2019-07-17 LAB — CBC WITH DIFFERENTIAL/PLATELET
Abs Immature Granulocytes: 0.03 10*3/uL (ref 0.00–0.07)
Basophils Absolute: 0 10*3/uL (ref 0.0–0.1)
Basophils Relative: 0 %
Eosinophils Absolute: 0.1 10*3/uL (ref 0.0–0.5)
Eosinophils Relative: 1 %
HCT: 48.9 % (ref 39.0–52.0)
Hemoglobin: 15.8 g/dL (ref 13.0–17.0)
Immature Granulocytes: 0 %
Lymphocytes Relative: 26 %
Lymphs Abs: 2 10*3/uL (ref 0.7–4.0)
MCH: 29.8 pg (ref 26.0–34.0)
MCHC: 32.3 g/dL (ref 30.0–36.0)
MCV: 92.1 fL (ref 80.0–100.0)
Monocytes Absolute: 0.6 10*3/uL (ref 0.1–1.0)
Monocytes Relative: 7 %
Neutro Abs: 5 10*3/uL (ref 1.7–7.7)
Neutrophils Relative %: 66 %
Platelets: 218 10*3/uL (ref 150–400)
RBC: 5.31 MIL/uL (ref 4.22–5.81)
RDW: 12.1 % (ref 11.5–15.5)
WBC: 7.7 10*3/uL (ref 4.0–10.5)
nRBC: 0 % (ref 0.0–0.2)

## 2019-07-17 LAB — URINALYSIS, COMPLETE (UACMP) WITH MICROSCOPIC
Bacteria, UA: NONE SEEN
Bilirubin Urine: NEGATIVE
Glucose, UA: >=500 mg/dL — AB
Hgb urine dipstick: NEGATIVE
Ketones, ur: 20 mg/dL — AB
Leukocytes,Ua: NEGATIVE
Nitrite: NEGATIVE
Protein, ur: 30 mg/dL — AB
Specific Gravity, Urine: 1.027 (ref 1.005–1.030)
Squamous Epithelial / HPF: NONE SEEN (ref 0–5)
pH: 6 (ref 5.0–8.0)

## 2019-07-17 LAB — COMPREHENSIVE METABOLIC PANEL
ALT: 19 U/L (ref 0–44)
ALT: 22 U/L (ref 0–44)
AST: 19 U/L (ref 15–41)
AST: 20 U/L (ref 15–41)
Albumin: 4.3 g/dL (ref 3.5–5.0)
Albumin: 4.5 g/dL (ref 3.5–5.0)
Alkaline Phosphatase: 66 U/L (ref 38–126)
Alkaline Phosphatase: 71 U/L (ref 38–126)
Anion gap: 12 (ref 5–15)
Anion gap: 11 (ref 5–15)
BUN: 23 mg/dL — ABNORMAL HIGH (ref 6–20)
BUN: 22 mg/dL — ABNORMAL HIGH (ref 6–20)
CO2: 25 mmol/L (ref 22–32)
CO2: 24 mmol/L (ref 22–32)
Calcium: 9.5 mg/dL (ref 8.9–10.3)
Calcium: 9.4 mg/dL (ref 8.9–10.3)
Chloride: 99 mmol/L (ref 98–111)
Chloride: 100 mmol/L (ref 98–111)
Creatinine, Ser: 1.18 mg/dL (ref 0.61–1.24)
Creatinine, Ser: 1.13 mg/dL (ref 0.61–1.24)
GFR calc Af Amer: >60 mL/min (ref >60)
GFR calc Af Amer: >60 mL/min (ref >60)
GFR calc non Af Amer: >60 mL/min (ref >60)
GFR calc non Af Amer: >60 mL/min (ref >60)
Glucose, Bld: 200 mg/dL — ABNORMAL HIGH (ref 70–99)
Glucose, Bld: 180 mg/dL — ABNORMAL HIGH (ref 70–99)
Potassium: 4.6 mmol/L (ref 3.5–5.1)
Potassium: 4 mmol/L (ref 3.5–5.1)
Sodium: 136 mmol/L (ref 135–145)
Sodium: 135 mmol/L (ref 135–145)
Total Bilirubin: 0.8 mg/dL (ref 0.3–1.2)
Total Bilirubin: 1.2 mg/dL (ref 0.3–1.2)
Total Protein: 7.2 g/dL (ref 6.5–8.1)
Total Protein: 7.8 g/dL (ref 6.5–8.1)

## 2019-07-17 LAB — CBC
HCT: 49.2 % (ref 39.0–52.0)
Hemoglobin: 16.3 g/dL (ref 13.0–17.0)
MCH: 30.2 pg (ref 26.0–34.0)
MCHC: 33.1 g/dL (ref 30.0–36.0)
MCV: 91.3 fL (ref 80.0–100.0)
Platelets: 237 10*3/uL (ref 150–400)
RBC: 5.39 MIL/uL (ref 4.22–5.81)
RDW: 12.1 % (ref 11.5–15.5)
WBC: 9.3 10*3/uL (ref 4.0–10.5)
nRBC: 0 % (ref 0.0–0.2)

## 2019-07-17 LAB — TSH: TSH: 0.665 u[IU]/mL (ref 0.350–4.500)

## 2019-07-17 LAB — PROTEIN, URINE, RANDOM: Total Protein, Urine: 20 mg/dL

## 2019-07-17 LAB — LIPASE, BLOOD: Lipase: 320 U/L — ABNORMAL HIGH (ref 11–51)

## 2019-07-17 MED ORDER — DIPHENHYDRAMINE HCL 50 MG/ML IJ SOLN
25.0000 mg | Freq: Once | INTRAMUSCULAR | Status: AC
  Administered 2019-07-17: 25 mg via INTRAVENOUS
  Filled 2019-07-17: qty 1

## 2019-07-17 MED ORDER — PROCHLORPERAZINE MALEATE 10 MG PO TABS: 10.0000 mg | ORAL_TABLET | Freq: Four times a day (QID) | ORAL | 0 refills | Status: DC | PRN

## 2019-07-17 MED ORDER — SODIUM CHLORIDE 0.9 % IV SOLN
Freq: Once | INTRAVENOUS | Status: AC
  Filled 2019-07-17: qty 250

## 2019-07-17 MED ORDER — PROCHLORPERAZINE EDISYLATE 10 MG/2ML IJ SOLN
10.0000 mg | Freq: Once | INTRAMUSCULAR | Status: AC
  Administered 2019-07-17: 10 mg via INTRAVENOUS
  Filled 2019-07-17: qty 2

## 2019-07-17 MED ORDER — SODIUM CHLORIDE 0.9 % IV SOLN
200.0000 mg | Freq: Once | INTRAVENOUS | Status: AC
  Administered 2019-07-17: 200 mg via INTRAVENOUS
  Filled 2019-07-17: qty 8

## 2019-07-17 MED ORDER — ONDANSETRON 4 MG PO TBDP
4.0000 mg | ORAL_TABLET | Freq: Once | ORAL | Status: AC | PRN
  Administered 2019-07-17: 4 mg via ORAL
  Filled 2019-07-17: qty 1

## 2019-07-17 NOTE — ED Provider Notes (Signed)
[facility] Emergency Department Provider Note   ____________________________________________   First MD Initiated Contact with Patient 07/17/19 2044     (approximate)  I have reviewed the triage vital signs and the nursing notes.   HISTORY  Chief Complaint Headache and Nausea    HPI Matthew Woodard is a 54 y.o. 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  Patient reports that he has been dealing with intermittent headaches over the past 2 weeks that have become more frequent and severe.  He primarily has pain behind both of his eyes, but denies any vision changes, speech changes, numbness, or weakness.  He initially saw his oncologist for this problem, who told him to take Tylenol and provided follow-up with ENT.  ENT has set him up for MRI at the end of next week, but he states symptoms have continued to worsen and are not controlled by Tylenol.  He denies any history of headaches, and has been feeling nauseous with multiple episodes of vomiting.        Past Medical History:  Diagnosis Date  . Arthritis    hands, left knee  . Cancer ([place])   . Diabetes mellitus without complication ([place])   . Eczema 01/08/2017  . GERD (gastroesophageal reflux disease)   . History of kidney cancer   . Hyperlipidemia LDL goal <100 11/02/2014  . Hypertension   . Kidney stone    YEAR H/O HEMATURIA  . Psoriasis   . Wears dentures    full upper, partial lower.  Has, does not wear    Patient Active Problem List   Diagnosis Date Noted  . Uncontrolled hypertension 07/10/2019  . Nonintractable headache 07/10/2019  . Goals of care, counseling/discussion 06/05/2019  . Encounter for antineoplastic immunotherapy 06/05/2019  . Encounter for antineoplastic chemotherapy 06/05/2019  . 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 Metastatic renal cell  carcinoma ([place]) 07/22/2018  . Renal mass, right 06/23/2018  . Enlarged prostate 06/23/2018  . Aortic atherosclerosis ([place]) 06/23/2018  . Renal cyst 06/23/2018  . Diabetes mellitus type 2 in obese ([place]) 06/14/2018  . Psoriasis 07/25/2017  . Encounter for tobacco use screening 05/09/2017  . Osteoarthritis of knee 03/19/2017  . Benign neoplasm of descending colon   . Nonproliferative diabetic retinopathy of both eyes ([place]) 02/12/2016  . Colon cancer screening 10/12/2015  . Preventative health care 10/12/2015  . Obesity 09/08/2015  . Acid reflux 11/05/2014  . Hyperlipidemia LDL goal <100 11/02/2014  . Medication monitoring encounter 11/02/2014    Past Surgical History:  Procedure Laterality Date  . COLONOSCOPY WITH PROPOFOL N/A 02/23/2017   Procedure: COLONOSCOPY WITH PROPOFOL;  Surgeon: [name], MD;  Location: [facility];  Service: Endoscopy;  Laterality: N/A;  Diabetic - oral meds  . CYST EXCISION  Aug. 2016   on back  . CYSTOSCOPY W/ RETROGRADES Right 06/28/2018   Procedure: CYSTOSCOPY WITH RETROGRADE PYELOGRAM;  Surgeon: Billey Co, MD;  Location: [facility];  Service: Urology;  Laterality: Right;  . CYSTOSCOPY W/ URETERAL STENT REMOVAL Right 07/22/2018   Procedure: CYSTOSCOPY WITH STENT REMOVAL;  Surgeon: Billey Co, MD;  Location: [facility];  Service: Urology;  Laterality: Right;  . CYSTOSCOPY WITH BIOPSY Right 06/28/2018   Procedure: CYSTOSCOPY WITH RENAL BIOPSY;  Surgeon: Billey Co, MD;  Location: [facility];  Service: Urology;  Laterality: Right;  . CYSTOSCOPY WITH  URETEROSCOPY AND STENT PLACEMENT Right 06/28/2018   Procedure: CYSTOSCOPY WITH URETEROSCOPY AND STENT PLACEMENT;  Surgeon: Billey Co, MD;  Location: [facility];  Service: Urology;  Laterality: Right;  . FULGURATION OF BLADDER TUMOR N/A 06/28/2018   Procedure: CYSTOSCOPY BLADDER BIOPSY, FULGERATION OF BLADDER;  Surgeon: Billey Co, MD;  Location: [facility];  Service: Urology;   Laterality: N/A;  . KNEE SURGERY Left   . LAPAROSCOPIC NEPHRECTOMY, HAND ASSISTED Right 07/22/2018   Procedure: HAND ASSISTED LAPAROSCOPIC NEPHRECTOMY;  Surgeon: Billey Co, MD;  Location: [facility];  Service: Urology;  Laterality: Right;  . NASAL SINUS SURGERY  03/15  . POLYPECTOMY N/A 02/23/2017   Procedure: POLYPECTOMY;  Surgeon: [name], MD;  Location: [place] [facility];  Service: Endoscopy;  Laterality: N/A;  . tooth abstraction    . TRANSURETHRAL RESECTION OF BLADDER TUMOR N/A 05/12/2019   Procedure: TRANSURETHRAL RESECTION OF BLADDER TUMOR (TURBT);  Surgeon: Billey Co, MD;  Location: [facility];  Service: Urology;  Laterality: N/A;  . VARICOCELE EXCISION      Prior to Admission medications   Medication Sig Start Date End Date Taking? Authorizing Provider  amLODipine (NORVASC) 2.5 MG tablet Take 1 tablet (2.5 mg total) by mouth daily. 07/10/19   Earlie Server, MD  aspirin EC 81 MG tablet Take 81 mg by mouth daily.    [provider]  axitinib (INLYTA) 1 MG tablet Take 2 tablets (2 mg total) by mouth 2 (two) times daily. Take with one 5mg  tablet twice daily. Patient not taking: Reported on 07/10/2019 07/17/19   Earlie Server, MD  Boswellia-Glucosamine-Vit D (OSTEO BI-FLEX ONE PER DAY) TABS Take 1 tablet by mouth daily.    [provider]  clobetasol cream (TEMOVATE) [id number] % Apply 1 application topically 2 (two) times daily. To affected skin; too strong for face, groin, underarms Patient not taking: Reported on 07/17/2019 01/08/17   Arnetha Courser, MD  Glucose Blood (ACCU-CHEK AVIVA PLUS VI) by In Vitro route.    [provider]  INLYTA 5 MG tablet TAKE 1 TABLET BY MOUTH TWICE A DAY (12 HOURS APART) WITH A FULL GLASS OF WATER. MAY TAKE WITH OR WITHOUT FOOD. AVOID GRAPEFRUIT PRODUCTS. Patient not taking: Reported on 07/17/2019 06/26/19   Earlie Server, MD  JARDIANCE 10 MG TABS tablet TAKE 1 TABLET BY MOUTH DAILY Patient taking differently: Take 10 mg by mouth daily.   12/05/18   Hubbard Hartshorn, FNP  lisinopril (PRINIVIL,ZESTRIL) 10 MG tablet TAKE 1 TABLET(10 MG) BY MOUTH DAILY Patient taking differently: Take 10 mg by mouth daily.  08/01/18   Arnetha Courser, MD  loperamide (IMODIUM) 2 MG capsule Take 1 capsule (2 mg total) by mouth See admin instructions. With onset of loose stool, take 4mg  followed by 2mg  every 2 hours until 12 hours have passed without loose bowel movement. Maximum: 16 mg/day 06/05/19   Earlie Server, MD  meloxicam (MOBIC) 15 MG tablet Take 15 mg by mouth daily.  10/29/18   [provider]  metFORMIN (GLUCOPHAGE-XR) 500 MG 24 hr tablet Take 3 pills by mouth daily for 4-5 days, then increase to 4 pills daily (if tolerated) Patient taking differently: Take 2,000 mg by mouth daily with breakfast.  08/15/18   [name], [name], MD  Multiple Vitamin (MULTIVITAMIN) tablet Take 1 tablet by mouth daily.    [provider]  Omega-3 Fatty Acids (FISH OIL) 1200 MG CAPS Take 1,200 mg by mouth daily.     [provider]  Omeprazole (PRILOSEC PO) Take 20 mg by mouth daily as needed (heart burn).     [provider]  OZEMPIC, 0.25 OR 0.5 MG/DOSE, 2 MG/1.5ML SOPN  06/13/19   [provider]  pioglitazone (ACTOS) 45 MG tablet TAKE 1 TABLET(45 MG) BY MOUTH DAILY Patient taking differently: Take 45 mg by mouth daily.  08/01/18   Arnetha Courser, MD  prochlorperazine (COMPAZINE) 10 MG tablet Take 1 tablet (10 mg total) by mouth every 6 (six) hours as needed for nausea or vomiting. 07/17/19   Blake Divine, MD  rosuvastatin (CRESTOR) 10 MG tablet Take 1 tablet (10 mg total) by mouth at bedtime. 09/24/18   Poulose, Bethel Born, NP    Allergies Glucotrol [glipizide]  Family History  Problem Relation Age of Onset  . Heart disease Father   . Hypertension Father   . COPD Father   . Colon cancer Maternal Grandmother   . Heart attack Paternal Grandfather     Social History Social History   Tobacco Use  . Smoking status: Former  Smoker    Types: Cigarettes    Quit date: 06/15/2013    Years since quitting: 6.0  . Smokeless tobacco: Never Used  Substance Use Topics  . Alcohol use: No    Alcohol/week: 0.0 standard drinks  . Drug use: No    Review of Systems  Constitutional: No fever/chills Eyes: No visual changes. ENT: No sore throat. Cardiovascular: Denies chest pain. Respiratory: Denies shortness of breath. Gastrointestinal: No abdominal pain.  Positive for nausea and vomiting.  No diarrhea.  No constipation. Genitourinary: Negative for dysuria. Musculoskeletal: Negative for back pain. Skin: Negative for rash. Neurological: Positive for headaches, negative for focal weakness or numbness.  ____________________________________________   PHYSICAL EXAM:  VITAL SIGNS: ED Triage Vitals [07/17/19 1739]  Enc Vitals Group     BP (!) 156/88     Pulse Rate 91     Resp 18     Temp 98.5 F (36.9 C)     Temp Source Oral     SpO2 97 %     Weight 235 lb (106.6 kg)     Height 5\' 11"  (1.803 m)     Head Circumference      Peak Flow      Pain Score 8     Pain Loc      Pain Edu?      Excl. in [place]?     Constitutional: Alert and oriented. Eyes: Conjunctivae are normal.  Pupils equal round and reactive to light bilaterally, extraocular movements intact without nystagmus. Head: Atraumatic. Nose: No congestion/rhinnorhea. Mouth/Throat: Mucous membranes are moist. Neck: Normal ROM Cardiovascular: Normal rate, regular rhythm. Grossly normal heart sounds. Respiratory: Normal respiratory effort.  No retractions. Lungs CTAB. Gastrointestinal: Soft and nontender. No distention. Genitourinary: deferred Musculoskeletal: No lower extremity tenderness nor edema. Neurologic:  Normal speech and language. No gross focal neurologic deficits are appreciated. Skin:  Skin is warm, dry and intact. No rash noted. Psychiatric: Mood and affect are normal. Speech and behavior are  normal.  ____________________________________________   LABS (all labs ordered are listed, but only abnormal results are displayed)  Labs Reviewed  LIPASE, BLOOD - Abnormal; Notable for the following components:      Result Value   Lipase 320 (*)    All other components within normal limits  COMPREHENSIVE METABOLIC PANEL - Abnormal; Notable for the following components:   Glucose, Bld 180 (*)    BUN 22 (*)  All other components within normal limits  URINALYSIS, COMPLETE (UACMP) WITH MICROSCOPIC - Abnormal; Notable for the following components:   Color, Urine YELLOW (*)    APPearance CLEAR (*)    Glucose, UA >=500 (*)    Ketones, ur 20 (*)    Protein, ur 30 (*)    All other components within normal limits  CBC   ____________________________________________  EKG  ED ECG REPORT I, Blake Divine, the attending physician, personally viewed and interpreted this ECG.   Date: 07/17/2019  EKG Time: 17:44  Rate: 89  Rhythm: normal sinus rhythm, sinus arrhythmia  Axis: RAD  Intervals:none  ST&T Change: None   PROCEDURES  Procedure(s) performed (including Critical Care):  Procedures   ____________________________________________   INITIAL IMPRESSION / ASSESSMENT AND PLAN / ED COURSE       54 year old male with history of metastatic renal cell carcinoma currently on immunotherapy presents to the ED complaining of intermittent frontal headaches over the past couple of weeks that have been increasing in severity and associated with nausea and vomiting.  He has a nonfocal neurologic exam here in the ED, nausea was improved following dose of Zofran in the waiting room, but he continues to have significant headache. EKG without evidence of ischemia or arrhythmia.  Lab work significant only for elevated lipase, however patient has no abdominal pain whatsoever and no tenderness on exam, I doubt acute pancreatitis and elevated lipase likely secondary to his vomiting.  We will  treat with IV Compazine and Benadryl, also check CT head given his history of metastatic disease.  I doubt SAH or meningitis given waxing and waning symptoms gradually worsening over the past 2 weeks.  No fevers or meningismus.  Symptoms improved following Compazine and Benadryl, CT head is negative for acute process.  At this point, patient is appropriate for discharge home to follow-up with his PCP and oncology, he has MRI of brain scheduled within the week.  Patient counseled to return to the ED for new or worsening symptoms, patient agrees with plan.      ____________________________________________   FINAL CLINICAL IMPRESSION(S) / ED DIAGNOSES  Final diagnoses:  Acute nonintractable headache, unspecified headache type  Non-intractable vomiting with nausea, unspecified vomiting type  Renal cell carcinoma of right kidney metastatic to other site [facility])     ED Discharge Orders         Ordered    prochlorperazine (COMPAZINE) 10 MG tablet  Every 6 hours PRN     07/17/19 2246           Note:  This document was prepared using Dragon voice recognition software and may include unintentional dictation errors.   Blake Divine, MD 07/17/19 2248

## 2019-07-17 NOTE — ED Triage Notes (Signed)
Pt comes POV with h/a and n/v. Pt is a renal cell carcinoma pt with mets to lungs. Pt has been having these new headaches for a couple weeks. PCP wanted him to see an ENT and they scheduled an MRI for next week.

## 2019-07-17 NOTE — Progress Notes (Signed)
Patient here for follow. Pt still having headache but not as strong Pt saw ENT ([name]) yesterday and is scheduled to have head MRI next week. Took medication on Sunday and continued to have a headache.

## 2019-07-17 NOTE — ED Notes (Signed)
Pt transported to CT ?

## 2019-07-18 ENCOUNTER — Other Ambulatory Visit: Payer: Self-pay

## 2019-07-18 ENCOUNTER — Other Ambulatory Visit: Payer: Self-pay | Admitting: Oncology

## 2019-07-18 DIAGNOSIS — R519 Headache, unspecified: Secondary | ICD-10-CM

## 2019-07-18 DIAGNOSIS — C641 Malignant neoplasm of right kidney, except renal pelvis: Secondary | ICD-10-CM

## 2019-07-19 NOTE — Progress Notes (Signed)
Hematology/Oncology follow up note Matthew Woodard [phone] [phone]   Patient Care Team: [name], NP as PCP - General [name], [name], MD as PCP - Family Medicine (Family Medicine) [name], MD as Consulting Physician (General Surgery) [name], [name], MD as Attending Physician (Family Medicine) [name], [name], MD as Consulting Physician (Dermatology)  REFERRING PROVIDER: [name], NP  CHIEF COMPLAINTS/REASON FOR VISIT:  Evaluation of kidney cancer  HISTORY OF PRESENTING ILLNESS:   Matthew Woodard is a  54 y.o.  male with PMH listed below was seen in consultation at the request of  [name], NP  for evaluation of kidney cancer Patient's cancer history dated back to February 2020 when he developed gross hematuria with small clots and right-sided flank pain.  Patient had a CT urogram done which showed right 4 cm central enhancing renal mass with invasion into the collecting system.  X-ray of chest was performed to see complete staging which showed no concerning findings. 06/28/2018 patient underwent a cystoscopy, bladder biopsy and a right retrograde pyelogram with intraoperative interpretation, right diagnostic ureteroscopy, right renal pelvis biopsy, right ureteral stent placement.  biopsy showed atypical cell clusters, with extensive crush artifact, nondiagnostic.   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  Surgical margins negative for tumor.pT3a,Nx Patient has been on surveillance after surgery. 10/28/2018 CT abdomen pelvis with contrast showed minimal fluid and or postoperative changes within the right renal fossa.  No evidence of metastatic disease or recurrence.  Patient has left kidney lesion previously characterized as  nonenhancing cyst by multi phasic contrast-enhanced CT. Patient reports an episode of gross hematuria in early December 2020, patient is due for 26-month surveillance CT scan. 04/30/2019 CT abdomen pelvis with contrast showed high attenuation mass at the right uretero vesicle junction, with extension into the bladder.  Bilateral pulmonary nodules, most indicated for metastatic disease.   Patient underwent cystoscopy and transurethral resection of irregular bladder tumor 3 cm.  Mass appeared to emanate from the right ureteral orifice.  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 Patient was referred to me for further work-up and management.  #Staging chest CT with contrast showed numerous bilateral pulmonary nodules.  Compatible with metastatic disease. Bone scan showed no osseous metastatic disease.   #History of tobacco abuse, former smoker.  Quit in 2015.  He denies any hemoptysis, chest pain, shortness of breath today. Patient reports that hematuria has completely resolved.  Denies any pain today. He is accompanied by his wife.  #History of cutaneous psoriasis, mostly on his right hand, currently on weekly methotrexate 12.5 mg with good symptom control. #NGS: [facility] PD L1 TPS 1%  INTERVAL HISTORY Matthew Woodard is a 54 y.o. male who has above history reviewed by me today presents for follow up visit for management of metastatic RCC, therapy side effects.  Problems and complaints are listed below: During the interval, patient was referred to ENT for evaluation of sinus pressure.  He had a ENT evaluation and feels that patient does not have sinusitis.  [name] recommended MRI brain to rule out PRES syndrome.  Axitinib has been held. Patient describes that he also has had severe headache after taking axitinib. Blood pressure was high at the last visit and amlodipine 2.5 mg daily was added. His blood pressure is 145/84. Patient  denies any  blurry vision, focal deficits, nausea or vomiting today.   Review of Systems  Constitutional: Negative for appetite change, chills, fatigue, fever and unexpected weight change.  HENT:   Negative for hearing loss and voice change.        Sinus pressure  Eyes: Negative for eye problems and icterus.  Respiratory: Negative for chest tightness, cough and shortness of breath.   Cardiovascular: Negative for chest pain and leg swelling.  Gastrointestinal: Negative for abdominal distention, abdominal pain, blood in stool and nausea.  Endocrine: Negative for hot flashes.  Genitourinary: Negative for difficulty urinating, dysuria and frequency.   Musculoskeletal: Negative for arthralgias.  Skin: Negative for itching and rash.  Neurological: Positive for headaches. Negative for extremity weakness, light-headedness and numbness.  Hematological: Negative for adenopathy. Does not bruise/bleed easily.  Psychiatric/Behavioral: Negative for confusion.    MEDICAL HISTORY:  Past Medical History:  Diagnosis Date  . Arthritis    hands, left knee  . Cancer ([place])   . Diabetes mellitus without complication ([place])   . Eczema 01/08/2017  . GERD (gastroesophageal reflux disease)   . History of kidney cancer   . Hyperlipidemia LDL goal <100 11/02/2014  . Hypertension   . Kidney stone    YEAR H/O HEMATURIA  . Psoriasis   . Wears dentures    full upper, partial lower.  Has, does not wear    SURGICAL HISTORY: Past Surgical History:  Procedure Laterality Date  . COLONOSCOPY WITH PROPOFOL N/A 02/23/2017   Procedure: COLONOSCOPY WITH PROPOFOL;  Surgeon: [name], MD;  Location: [place];  Service: Endoscopy;  Laterality: N/A;  Diabetic - oral meds  . CYST EXCISION  Aug. 2016   on back  . CYSTOSCOPY W/ RETROGRADES Right 06/28/2018   Procedure: CYSTOSCOPY WITH RETROGRADE PYELOGRAM;  Surgeon: Billey Co, MD;  Location: [facility];  Service: Urology;  Laterality: Right;  . CYSTOSCOPY W/  URETERAL STENT REMOVAL Right 07/22/2018   Procedure: CYSTOSCOPY WITH STENT REMOVAL;  Surgeon: Billey Co, MD;  Location: [facility];  Service: Urology;  Laterality: Right;  . CYSTOSCOPY WITH BIOPSY Right 06/28/2018   Procedure: CYSTOSCOPY WITH RENAL BIOPSY;  Surgeon: Billey Co, MD;  Location: [facility];  Service: Urology;  Laterality: Right;  . CYSTOSCOPY WITH URETEROSCOPY AND STENT PLACEMENT Right 06/28/2018   Procedure: CYSTOSCOPY WITH URETEROSCOPY AND STENT PLACEMENT;  Surgeon: Billey Co, MD;  Location: [facility];  Service: Urology;  Laterality: Right;  . FULGURATION OF BLADDER TUMOR N/A 06/28/2018   Procedure: CYSTOSCOPY BLADDER BIOPSY, FULGERATION OF BLADDER;  Surgeon: Billey Co, MD;  Location: [facility];  Service: Urology;  Laterality: N/A;  . KNEE SURGERY Left   . LAPAROSCOPIC NEPHRECTOMY, HAND ASSISTED Right 07/22/2018   Procedure: HAND ASSISTED LAPAROSCOPIC NEPHRECTOMY;  Surgeon: Billey Co, MD;  Location: [facility];  Service: Urology;  Laterality: Right;  . NASAL SINUS SURGERY  03/15  . POLYPECTOMY N/A 02/23/2017   Procedure: POLYPECTOMY;  Surgeon: [name], MD;  Location: [place];  Service: Endoscopy;  Laterality: N/A;  . tooth abstraction    . TRANSURETHRAL RESECTION OF BLADDER TUMOR N/A 05/12/2019   Procedure: TRANSURETHRAL RESECTION OF BLADDER TUMOR (TURBT);  Surgeon: Billey Co, MD;  Location: [facility];  Service: Urology;  Laterality: N/A;  . VARICOCELE EXCISION      SOCIAL HISTORY: Social History   Socioeconomic History  . Marital status: Married    Spouse name: Not on file  . Number of children:  Not on file  . Years of education: Not on file  . Highest education level: Not on file  Occupational History  . Not on file  Tobacco Use  . Smoking status: Former Smoker    Types: Cigarettes    Quit date: 06/15/2013    Years since quitting: 6.0  . Smokeless tobacco: Never Used  Substance and Sexual Activity  . Alcohol use: No     Alcohol/week: 0.0 standard drinks  . Drug use: No  . Sexual activity: Yes  Other Topics Concern  . Not on file  Social History Narrative  . Not on file   Social Determinants of Health   Financial Resource Strain:   . Difficulty of Paying Living Expenses: Not on file  Food Insecurity:   . Worried About Charity fundraiser in the Last Year: Not on file  . Ran Out of Food in the Last Year: Not on file  Transportation Needs:   . Lack of Transportation (Medical): Not on file  . Lack of Transportation (Non-Medical): Not on file  Physical Activity:   . Days of Exercise per Week: Not on file  . Minutes of Exercise per Session: Not on file  Stress:   . Feeling of Stress : Not on file  Social Connections:   . Frequency of Communication with Friends and Family: Not on file  . Frequency of Social Gatherings with Friends and Family: Not on file  . Attends Religious Services: Not on file  . Active Member of Clubs or Organizations: Not on file  . Attends Archivist Meetings: Not on file  . Marital Status: Not on file  Intimate Partner Violence:   . Fear of Current or Ex-Partner: Not on file  . Emotionally Abused: Not on file  . Physically Abused: Not on file  . Sexually Abused: Not on file    FAMILY HISTORY: Family History  Problem Relation Age of Onset  . Heart disease Father   . Hypertension Father   . COPD Father   . Colon cancer Maternal Grandmother   . Heart attack Paternal Grandfather     ALLERGIES:  is allergic to glucotrol [glipizide].  MEDICATIONS:  Current Outpatient Medications  Medication Sig Dispense Refill  . amLODipine (NORVASC) 2.5 MG tablet Take 1 tablet (2.5 mg total) by mouth daily. 30 tablet 0  . aspirin EC 81 MG tablet Take 81 mg by mouth daily.    . [place]-Glucosamine-Vit D (OSTEO BI-FLEX ONE PER DAY) TABS Take 1 tablet by mouth daily.    . Glucose Blood (ACCU-CHEK AVIVA PLUS VI) by In Vitro route.    Marland Kitchen JARDIANCE 10 MG TABS tablet TAKE 1  TABLET BY MOUTH DAILY (Patient taking differently: Take 10 mg by mouth daily. ) 30 tablet 2  . lisinopril (PRINIVIL,ZESTRIL) 10 MG tablet TAKE 1 TABLET(10 MG) BY MOUTH DAILY (Patient taking differently: Take 10 mg by mouth daily. ) 90 tablet 1  . loperamide (IMODIUM) 2 MG capsule Take 1 capsule (2 mg total) by mouth See admin instructions. With onset of loose stool, take 4mg  followed by 2mg  every 2 hours until 12 hours have passed without loose bowel movement. Maximum: 16 mg/day 60 capsule 0  . meloxicam (MOBIC) 15 MG tablet Take 15 mg by mouth daily.     . metFORMIN (GLUCOPHAGE-XR) 500 MG 24 hr tablet Take 3 pills by mouth daily for 4-5 days, then increase to 4 pills daily (if tolerated) (Patient taking differently: Take 2,000 mg by mouth daily  with breakfast. ) 360 tablet 0  . Multiple Vitamin (MULTIVITAMIN) tablet Take 1 tablet by mouth daily.    . Omega-3 Fatty Acids (FISH OIL) 1200 MG CAPS Take 1,200 mg by mouth daily.     . Omeprazole (PRILOSEC PO) Take 20 mg by mouth daily as needed (heart burn).     Marland Kitchen OZEMPIC, 0.25 OR 0.5 MG/DOSE, 2 MG/1.5ML SOPN     . pioglitazone (ACTOS) 45 MG tablet TAKE 1 TABLET(45 MG) BY MOUTH DAILY (Patient taking differently: Take 45 mg by mouth daily. ) 90 tablet 1  . rosuvastatin (CRESTOR) 10 MG tablet Take 1 tablet (10 mg total) by mouth at bedtime. 90 tablet 1  . axitinib (INLYTA) 1 MG tablet Take 2 tablets (2 mg total) by mouth 2 (two) times daily. Take with one 5mg  tablet twice daily. (Patient not taking: Reported on 07/10/2019) 120 tablet 0  . clobetasol cream (TEMOVATE) [id number] % Apply 1 application topically 2 (two) times daily. To affected skin; too strong for face, groin, underarms (Patient not taking: Reported on 07/17/2019) 30 g 2  . INLYTA 5 MG tablet TAKE 1 TABLET BY MOUTH TWICE A DAY (12 HOURS APART) WITH A FULL GLASS OF WATER. MAY TAKE WITH OR WITHOUT FOOD. AVOID GRAPEFRUIT PRODUCTS. (Patient not taking: Reported on 07/17/2019) 60 tablet 0  . prochlorperazine  (COMPAZINE) 10 MG tablet Take 1 tablet (10 mg total) by mouth every 6 (six) hours as needed for nausea or vomiting. 15 tablet 0   No current facility-administered medications for this visit.     PHYSICAL EXAMINATION: ECOG PERFORMANCE STATUS: 0 - Asymptomatic Vitals:   07/17/19 0928  BP: (!) 145/84  Pulse: 97  Resp: 18  Temp: (!) 95.3 F (35.2 C)   Filed Weights   07/17/19 0928  Weight: 239 lb (108.4 kg)    Physical Exam Constitutional:      General: He is not in acute distress. HENT:     Head: Normocephalic and atraumatic.  Eyes:     General: No scleral icterus.    Pupils: Pupils are equal, round, and reactive to light.  Cardiovascular:     Rate and Rhythm: Normal rate and regular rhythm.     Heart sounds: Normal heart sounds.  Pulmonary:     Effort: Pulmonary effort is normal. No respiratory distress.     Breath sounds: No wheezing.  Abdominal:     General: Bowel sounds are normal. There is no distension.     Palpations: Abdomen is soft. There is no mass.     Tenderness: There is no abdominal tenderness.  Musculoskeletal:        General: No deformity. Normal range of motion.     Cervical back: Normal range of motion and neck supple.  Skin:    General: Skin is warm and dry.     Findings: No erythema or rash.  Neurological:     Mental Status: He is alert and oriented to person, place, and time. Mental status is at baseline.     Cranial Nerves: No cranial nerve deficit.     Coordination: Coordination normal.  Psychiatric:        Mood and Affect: Mood normal.        Behavior: Behavior normal.        Thought Content: Thought content normal.     LABORATORY DATA:  I have reviewed the data as listed Lab Results  Component Value Date   WBC 9.3 07/17/2019   HGB 16.3 07/17/2019  HCT 49.2 07/17/2019   MCV 91.3 07/17/2019   PLT 237 07/17/2019   Recent Labs    07/10/19 1332 07/17/19 0902 07/17/19 1748  NA 136 136 135  K 4.4 4.6 4.0  CL 103 99 100  CO2 25  25 24   GLUCOSE 114* 200* 180*  BUN 23* 23* 22*  CREATININE 1.16 1.18 1.13  CALCIUM 9.4 9.5 9.4  GFRNONAA >60 >60 >60  GFRAA >60 >60 >60  PROT 7.6 7.2 7.8  ALBUMIN 4.1 4.3 4.5  AST 25 19 20   ALT 23 19 22   ALKPHOS 63 66 71  BILITOT 0.7 0.8 1.2   Iron/TIBC/Ferritin/ %Sat No results found for: IRON, TIBC, FERRITIN, IRONPCTSAT    RADIOGRAPHIC STUDIES: I have personally reviewed the radiological images as listed and agreed with the findings in the report.  DG Chest 2 View  Result Date: 04/30/2019 CLINICAL DATA:  History of prior right nephrectomy for renal cell carcinoma EXAM: CHEST - 2 VIEW COMPARISON:  10/31/2018 FINDINGS: The heart size and mediastinal contours are within normal limits. Both lungs are clear. The visualized skeletal structures are unremarkable. IMPRESSION: No active cardiopulmonary disease. Electronically Signed   By: Inez Catalina M.D.   On: 04/30/2019 12:57   CT Head Wo Contrast  Result Date: 07/17/2019 CLINICAL DATA:  Acute headaches and history of renal cell carcinoma with metastatic disease EXAM: CT HEAD WITHOUT CONTRAST TECHNIQUE: Contiguous axial images were obtained from the base of the skull through the vertex without intravenous contrast. COMPARISON:  None. FINDINGS: Brain: No evidence of acute infarction, hemorrhage, hydrocephalus, extra-axial collection or mass lesion/mass effect. Vascular: No hyperdense vessel or unexpected calcification. Skull: Normal. Negative for fracture or focal lesion. Sinuses/Orbits: No acute finding. Other: None. IMPRESSION: Normal head CT for age Electronically Signed   By: Inez Catalina M.D.   On: 07/17/2019 21:25   CT Chest W Contrast  Result Date: 05/28/2019 CLINICAL DATA:  Renal cell carcinoma.  Staging. EXAM: CT CHEST WITH CONTRAST TECHNIQUE: Multidetector CT imaging of the chest was performed during intravenous contrast administration. CONTRAST:  24mL OMNIPAQUE IOHEXOL 300 MG/ML  SOLN COMPARISON:  None. FINDINGS: Cardiovascular:  The heart size is normal. No substantial pericardial effusion. Mediastinum/Nodes: Left hilar lymphadenopathy measures up to 2.3 cm short axis. Upper normal mediastinal lymph nodes evident. The esophagus has normal imaging features. There is no axillary lymphadenopathy. Lungs/Pleura: Multiple bilateral pulmonary nodules measure up to a maximum size of approximately 10 mm in the right middle lobe. Cluster of tree-in-bud nodularity in the left base likely reflects atypical infection. No pleural effusion. Upper Abdomen: Unremarkable. Musculoskeletal: No worrisome lytic or sclerotic osseous abnormality. IMPRESSION: 1. Numerous bilateral pulmonary nodules measuring up to 10 mm diameter. Imaging features compatible with metastatic disease. 2. Left hilar lymphadenopathy, also highly suspicious for metastatic involvement. Electronically Signed   By: Misty Stanley M.D.   On: 05/28/2019 13:14   NM Bone Scan Whole Body  Result Date: 05/29/2019 CLINICAL DATA:  Bladder tumor, history of clear cell carcinoma of the RIGHT kidney EXAM: NUCLEAR MEDICINE WHOLE BODY BONE SCAN TECHNIQUE: Whole body anterior and posterior images were obtained approximately 3 hours after intravenous injection of radiopharmaceutical. RADIOPHARMACEUTICALS:  22.457 mCi Technetium-50m MDP IV COMPARISON:  None Correlation: CT chest 05/28/2019, CT abdomen and pelvis 04/30/2019 FINDINGS: Uptake at the shoulders, sternoclavicular joints, and knees, typically degenerative. Scattered physiologic uptake within costal cartilage. No worrisome sites of osseous tracer accumulation are identified to suggest osseous metastatic disease. Expected urinary tract and soft tissue distribution of tracer.  IMPRESSION: No scintigraphic evidence of osseous metastatic disease. Electronically Signed   By: Lavonia Dana M.D.   On: 05/29/2019 08:21   CT Abdomen Pelvis W Contrast  Result Date: 04/30/2019 CLINICAL DATA:  Renal cancer.  Right nephrectomy.  Hematuria. EXAM: CT  ABDOMEN AND PELVIS WITH CONTRAST TECHNIQUE: Multidetector CT imaging of the abdomen and pelvis was performed using the standard protocol following bolus administration of intravenous contrast. CONTRAST:  120mL OMNIPAQUE IOHEXOL 300 MG/ML  SOLN COMPARISON:  10/28/2018 and 06/21/2018. FINDINGS: Lower chest: There hematogenously distributed pulmonary nodules in the lung bases bilaterally. Index nodule in the inferior right lower lobe measures 5 mm (5/12). Heart size normal. Coronary artery calcification. No pericardial or pleural effusion. Distal esophagus is unremarkable. Distributed Hepatobiliary: Possible faint subcentimeter low-attenuation lesion in the dome of the liver (2/15). Liver and gallbladder are otherwise unremarkable. No biliary ductal dilatation. Pancreas: There may be tiny calcifications in the uncinate process of the pancreas, indicative of prior pancreatitis. Otherwise negative. Spleen: Negative. Adrenals/Urinary Tract: Right adrenal gland is unremarkable. Right nephrectomy with evolving scarring in the surgical bed (2/33). Left adrenal gland is unremarkable. A 2.4 cm hyperdense lesion in the interpolar left kidney is stable and was shown to represent a hyperdense cyst on 06/21/2018. An adjacent well-circumscribed 1.5 cm low-attenuation lesion in the medial interpolar left kidney is similar and likely a cyst. A new hyperattenuating lesion is seen at the right ureterovesical junction, extending into the bladder, measuring approximately 1.4 x 2.0 cm (2/89). Left ureter is decompressed. Bladder is otherwise unremarkable. Stomach/Bowel: Stomach, small bowel, appendix and colon are unremarkable. Vascular/Lymphatic: Atherosclerotic calcification of the aorta without aneurysm. No pathologically enlarged lymph nodes. Reproductive: Prostate is normal in size. Other: No free fluid. Mesenteries and peritoneum are unremarkable. Fatty invagination of the right lateral abdominal wall (2/60), possibly related to  prior surgery. Musculoskeletal: No worrisome lytic or sclerotic lesions. IMPRESSION: 1. High attenuation mass at the right ureterovesical junction, with extension into the bladder. Given caliceal extension on [id number], lesion is likely a metastasis. Primary urothelial carcinoma is not excluded. 2. Basilar pulmonary nodules, most indicative of metastatic disease. 3. Aortic atherosclerosis (ICD10-170.0). Coronary artery calcification. Electronically Signed   By: Lorin Picket M.D.   On: 04/30/2019 13:20   DG OR UROLOGY CYSTO IMAGE ([place])  Result Date: 05/12/2019 There is no interpretation for this exam.  This order is for images obtained during a surgical procedure.  Please See "Surgeries" Tab for more information regarding the procedure.      ASSESSMENT & PLAN:  1. Renal cell carcinoma of right kidney metastatic to other site [facility])   2. Nonintractable headache, unspecified chronicity pattern, unspecified headache type   3. 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 Status post  2 cycles of  Keytruda.   Axitinib has been held due to potential side effects Continue. Labs are reviewed and discussed with patient. Proceed with cycle 3 Keytruda.  #hypertension Continue lisinopril 10mg  daily and Norvasc 2.5mg  daily.   # Headache/Sinus pressure, I discussed with Dr. Pryor Ochoa.  I agree with obtaining MRI brain for further evaluation.  We spent sufficient time to discuss many aspect of care, questions were answered to patient's satisfaction. All questions were answered. The patient knows to call the clinic with any problems questions or concerns.  Return of visit: Follow-up in 3 weeks.    Earlie Server, MD, PhD Hematology Oncology [place] [facility]-  [phone] 07/19/2019  

## 2019-07-21 ENCOUNTER — Other Ambulatory Visit: Payer: Self-pay | Admitting: Oncology

## 2019-07-21 ENCOUNTER — Other Ambulatory Visit (HOSPITAL_COMMUNITY): Payer: Self-pay | Admitting: Oncology

## 2019-07-21 DIAGNOSIS — R519 Headache, unspecified: Secondary | ICD-10-CM

## 2019-07-22 ENCOUNTER — Ambulatory Visit: Payer: BC Managed Care – PPO

## 2019-07-22 ENCOUNTER — Ambulatory Visit (HOSPITAL_COMMUNITY): Payer: BC Managed Care – PPO

## 2019-07-22 ENCOUNTER — Encounter (HOSPITAL_COMMUNITY): Payer: Self-pay

## 2019-07-23 ENCOUNTER — Other Ambulatory Visit: Payer: Self-pay

## 2019-07-23 DIAGNOSIS — R519 Headache, unspecified: Secondary | ICD-10-CM

## 2019-07-25 ENCOUNTER — Other Ambulatory Visit: Payer: Self-pay

## 2019-07-25 ENCOUNTER — Ambulatory Visit: Payer: BC Managed Care – PPO

## 2019-07-25 ENCOUNTER — Ambulatory Visit
Admission: RE | Admit: 2019-07-25 | Discharge: 2019-07-25 | Disposition: A | Discharge status: Home / Self Care | Payer: BC Managed Care – PPO | Source: Ambulatory Visit | Attending: Oncology | Admitting: Oncology

## 2019-07-25 ENCOUNTER — Other Ambulatory Visit: Payer: BC Managed Care – PPO

## 2019-07-25 ENCOUNTER — Other Ambulatory Visit: Payer: Self-pay | Admitting: Oncology

## 2019-07-25 DIAGNOSIS — R519 Headache, unspecified: Secondary | ICD-10-CM | POA: Insufficient documentation

## 2019-07-25 MED ORDER — GADOBUTROL 1 MMOL/ML IV SOLN
10.0000 mL | Freq: Once | INTRAVENOUS | Status: AC | PRN
  Administered 2019-07-25: 10 mL via INTRAVENOUS

## 2019-07-28 ENCOUNTER — Ambulatory Visit: Payer: BC Managed Care – PPO

## 2019-07-29 ENCOUNTER — Encounter: Payer: Self-pay | Admitting: Oncology

## 2019-08-07 ENCOUNTER — Inpatient Hospital Stay: Payer: BC Managed Care – PPO

## 2019-08-07 ENCOUNTER — Encounter: Payer: Self-pay | Admitting: Oncology

## 2019-08-07 ENCOUNTER — Inpatient Hospital Stay (HOSPITAL_BASED_OUTPATIENT_CLINIC_OR_DEPARTMENT_OTHER): Payer: BC Managed Care – PPO | Admitting: Oncology

## 2019-08-07 VITALS — BP 141/94 | HR 111 | Temp 97.8°F | Wt 235.6 lb

## 2019-08-07 DIAGNOSIS — E119 Type 2 diabetes mellitus without complications: Secondary | ICD-10-CM | POA: Diagnosis not present

## 2019-08-07 DIAGNOSIS — I10 Essential (primary) hypertension: Secondary | ICD-10-CM

## 2019-08-07 DIAGNOSIS — Z5111 Encounter for antineoplastic chemotherapy: Secondary | ICD-10-CM

## 2019-08-07 DIAGNOSIS — Z5112 Encounter for antineoplastic immunotherapy: Secondary | ICD-10-CM

## 2019-08-07 DIAGNOSIS — Z791 Long term (current) use of non-steroidal anti-inflammatories (NSAID): Secondary | ICD-10-CM | POA: Diagnosis not present

## 2019-08-07 DIAGNOSIS — C641 Malignant neoplasm of right kidney, except renal pelvis: Secondary | ICD-10-CM

## 2019-08-07 DIAGNOSIS — E785 Hyperlipidemia, unspecified: Secondary | ICD-10-CM | POA: Diagnosis not present

## 2019-08-07 DIAGNOSIS — K219 Gastro-esophageal reflux disease without esophagitis: Secondary | ICD-10-CM | POA: Diagnosis not present

## 2019-08-07 DIAGNOSIS — C7800 Secondary malignant neoplasm of unspecified lung: Secondary | ICD-10-CM | POA: Diagnosis not present

## 2019-08-07 DIAGNOSIS — Z7982 Long term (current) use of aspirin: Secondary | ICD-10-CM | POA: Diagnosis not present

## 2019-08-07 DIAGNOSIS — Z79899 Other long term (current) drug therapy: Secondary | ICD-10-CM | POA: Diagnosis not present

## 2019-08-07 DIAGNOSIS — Z7984 Long term (current) use of oral hypoglycemic drugs: Secondary | ICD-10-CM | POA: Diagnosis not present

## 2019-08-07 DIAGNOSIS — Z905 Acquired absence of kidney: Secondary | ICD-10-CM | POA: Diagnosis not present

## 2019-08-07 DIAGNOSIS — L409 Psoriasis, unspecified: Secondary | ICD-10-CM | POA: Diagnosis not present

## 2019-08-07 DIAGNOSIS — Z87891 Personal history of nicotine dependence: Secondary | ICD-10-CM | POA: Diagnosis not present

## 2019-08-07 DIAGNOSIS — E1165 Type 2 diabetes mellitus with hyperglycemia: Secondary | ICD-10-CM | POA: Diagnosis not present

## 2019-08-07 DIAGNOSIS — R739 Hyperglycemia, unspecified: Secondary | ICD-10-CM

## 2019-08-07 LAB — COMPREHENSIVE METABOLIC PANEL
ALT: 25 U/L (ref 0–44)
AST: 27 U/L (ref 15–41)
Albumin: 4.3 g/dL (ref 3.5–5.0)
Alkaline Phosphatase: 63 U/L (ref 38–126)
Anion gap: 10 (ref 5–15)
BUN: 22 mg/dL — ABNORMAL HIGH (ref 6–20)
CO2: 24 mmol/L (ref 22–32)
Calcium: 9.6 mg/dL (ref 8.9–10.3)
Chloride: 98 mmol/L (ref 98–111)
Creatinine, Ser: 1.31 mg/dL — ABNORMAL HIGH (ref 0.61–1.24)
GFR calc Af Amer: >60 mL/min (ref >60)
GFR calc non Af Amer: >60 mL/min (ref >60)
Glucose, Bld: 258 mg/dL — ABNORMAL HIGH (ref 70–99)
Potassium: 4.4 mmol/L (ref 3.5–5.1)
Sodium: 132 mmol/L — ABNORMAL LOW (ref 135–145)
Total Bilirubin: 0.7 mg/dL (ref 0.3–1.2)
Total Protein: 7.1 g/dL (ref 6.5–8.1)

## 2019-08-07 LAB — CBC WITH DIFFERENTIAL/PLATELET
Abs Immature Granulocytes: 0.02 10*3/uL (ref 0.00–0.07)
Basophils Absolute: 0 10*3/uL (ref 0.0–0.1)
Basophils Relative: 1 %
Eosinophils Absolute: 0.2 10*3/uL (ref 0.0–0.5)
Eosinophils Relative: 3 %
HCT: 47 % (ref 39.0–52.0)
Hemoglobin: 15.7 g/dL (ref 13.0–17.0)
Immature Granulocytes: 0 %
Lymphocytes Relative: 28 %
Lymphs Abs: 1.8 10*3/uL (ref 0.7–4.0)
MCH: 29.6 pg (ref 26.0–34.0)
MCHC: 33.4 g/dL (ref 30.0–36.0)
MCV: 88.5 fL (ref 80.0–100.0)
Monocytes Absolute: 0.5 10*3/uL (ref 0.1–1.0)
Monocytes Relative: 8 %
Neutro Abs: 3.8 10*3/uL (ref 1.7–7.7)
Neutrophils Relative %: 60 %
Platelets: 217 10*3/uL (ref 150–400)
RBC: 5.31 MIL/uL (ref 4.22–5.81)
RDW: 11.9 % (ref 11.5–15.5)
WBC: 6.3 10*3/uL (ref 4.0–10.5)
nRBC: 0 % (ref 0.0–0.2)

## 2019-08-07 LAB — PROTEIN, URINE, RANDOM: Total Protein, Urine: 11 mg/dL

## 2019-08-07 MED ORDER — SODIUM CHLORIDE 0.9 % IV SOLN
Freq: Once | INTRAVENOUS | Status: AC
  Filled 2019-08-07: qty 250

## 2019-08-07 MED ORDER — SODIUM CHLORIDE 0.9 % IV SOLN
200.0000 mg | Freq: Once | INTRAVENOUS | Status: AC
  Administered 2019-08-07: 200 mg via INTRAVENOUS
  Filled 2019-08-07: qty 8

## 2019-08-07 NOTE — Progress Notes (Signed)
Patient does not offer any problems today.  

## 2019-08-07 NOTE — Progress Notes (Signed)
Hematology/Oncology follow up note [facility] Telephone:([phone] Fax:([phone]) [phone]   Patient Care Team: [name], NP as PCP - General [name], [name], MD as PCP - Family Medicine (Family Medicine) [name], MD as Consulting Physician (General Surgery) [name], [name], MD as Attending Physician (Family Medicine) [name], [name], MD as Consulting Physician (Dermatology)  REFERRING PROVIDER: [name], NP  CHIEF COMPLAINTS/REASON FOR VISIT:  Evaluation of kidney cancer  HISTORY OF PRESENTING ILLNESS:   Matthew Woodard is a  54 y.o.  male with PMH listed below was seen in consultation at the request of  [name], NP  for evaluation of kidney cancer Patient's cancer history dated back to February 2020 when he developed gross hematuria with small clots and right-sided flank pain.  Patient had a CT urogram done which showed right 4 cm central enhancing renal mass with invasion into the collecting system.  X-ray of chest was performed to see complete staging which showed no concerning findings. 06/28/2018 patient underwent a cystoscopy, bladder biopsy and a right retrograde pyelogram with intraoperative interpretation, right diagnostic ureteroscopy, right renal pelvis biopsy, right ureteral stent placement.  biopsy showed atypical cell clusters, with extensive crush artifact, nondiagnostic.   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  Surgical margins negative for tumor.pT3a,Nx Patient has been on surveillance after surgery. 10/28/2018 CT abdomen pelvis with contrast showed minimal fluid and or postoperative changes within the right renal fossa.  No evidence of metastatic disease or recurrence.  Patient has left kidney lesion previously characterized as  nonenhancing cyst by multi phasic contrast-enhanced CT. Patient reports an episode of gross hematuria in early December 2020, patient is due for 47-month surveillance CT scan. 04/30/2019 CT abdomen pelvis with contrast showed high attenuation mass at the right uretero vesicle junction, with extension into the bladder.  Bilateral pulmonary nodules, most indicated for metastatic disease.   Patient underwent cystoscopy and transurethral resection of irregular bladder tumor 3 cm.  Mass appeared to emanate from the right ureteral orifice.  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 Patient was referred to me for further work-up and management.  #Staging chest CT with contrast showed numerous bilateral pulmonary nodules.  Compatible with metastatic disease. Bone scan showed no osseous metastatic disease.   #History of tobacco abuse, former smoker.  Quit in 2015.  He denies any hemoptysis, chest pain, shortness of breath today. Patient reports that hematuria has completely resolved.  Denies any pain today. He is accompanied by his wife.  #History of cutaneous psoriasis, mostly on his right hand, currently on weekly methotrexate 12.5 mg with good symptom control. #NGS: [facility] PD L1 TPS 1%  # patient was referred to ENT for evaluation of headache and sinus pressure.  He had a ENT evaluation and feels that patient does not have sinusitis.  [name] recommended MRI brain to rule out PRES syndrome.  Axitinib has been held MRI brain was done and was negative.   INTERVAL HISTORY Matthew Woodard is a 54 y.o. male who has above history reviewed by me today presents for follow up visit for management of metastatic RCC, therapy side effects.  Problems and complaints are listed below: He was resumed on Axitinib 3mg  BID and has been doing well. Denies headache.  Bp is stable 141/94   Review of Systems  Constitutional: Negative for appetite change, chills,  fatigue, fever and unexpected weight change.  HENT:   Negative for hearing loss and voice change.   Eyes: Negative for eye problems and icterus.  Respiratory: Negative for chest tightness, cough and shortness of breath.   Cardiovascular: Negative for chest pain and leg swelling.  Gastrointestinal: Negative for abdominal distention, abdominal pain, blood in stool and nausea.  Endocrine: Negative for hot flashes.  Genitourinary: Negative for difficulty urinating, dysuria and frequency.   Musculoskeletal: Negative for arthralgias.  Skin: Negative for itching and rash.  Neurological: Negative for extremity weakness, headaches, light-headedness and numbness.  Hematological: Negative for adenopathy. Does not bruise/bleed easily.  Psychiatric/Behavioral: Negative for confusion.    MEDICAL HISTORY:  Past Medical History:  Diagnosis Date  . Arthritis    hands, left knee  . Cancer ([place])   . Diabetes mellitus without complication ([place])   . Eczema 01/08/2017  . GERD (gastroesophageal reflux disease)   . History of kidney cancer   . Hyperlipidemia LDL goal <100 11/02/2014  . Hypertension   . Kidney stone    YEAR H/O HEMATURIA  . Psoriasis   . Wears dentures    full upper, partial lower.  Has, does not wear    SURGICAL HISTORY: Past Surgical History:  Procedure Laterality Date  . COLONOSCOPY WITH PROPOFOL N/A 02/23/2017   Procedure: COLONOSCOPY WITH PROPOFOL;  Surgeon: [name], MD;  Location: [place];  Service: Endoscopy;  Laterality: N/A;  Diabetic - oral meds  . CYST EXCISION  Aug. 2016   on back  . CYSTOSCOPY W/ RETROGRADES Right 06/28/2018   Procedure: CYSTOSCOPY WITH RETROGRADE PYELOGRAM;  Surgeon: Billey Co, MD;  Location: [facility];  Service: Urology;  Laterality: Right;  . CYSTOSCOPY W/ URETERAL STENT REMOVAL Right 07/22/2018   Procedure: CYSTOSCOPY WITH STENT REMOVAL;  Surgeon: Billey Co, MD;  Location: [facility];  Service: Urology;  Laterality: Right;    . CYSTOSCOPY WITH BIOPSY Right 06/28/2018   Procedure: CYSTOSCOPY WITH RENAL BIOPSY;  Surgeon: Billey Co, MD;  Location: [facility];  Service: Urology;  Laterality: Right;  . CYSTOSCOPY WITH URETEROSCOPY AND STENT PLACEMENT Right 06/28/2018   Procedure: CYSTOSCOPY WITH URETEROSCOPY AND STENT PLACEMENT;  Surgeon: Billey Co, MD;  Location: [facility];  Service: Urology;  Laterality: Right;  . FULGURATION OF BLADDER TUMOR N/A 06/28/2018   Procedure: CYSTOSCOPY BLADDER BIOPSY, FULGERATION OF BLADDER;  Surgeon: Billey Co, MD;  Location: [facility];  Service: Urology;  Laterality: N/A;  . KNEE SURGERY Left   . LAPAROSCOPIC NEPHRECTOMY, HAND ASSISTED Right 07/22/2018   Procedure: HAND ASSISTED LAPAROSCOPIC NEPHRECTOMY;  Surgeon: Billey Co, MD;  Location: [facility];  Service: Urology;  Laterality: Right;  . NASAL SINUS SURGERY  03/15  . POLYPECTOMY N/A 02/23/2017   Procedure: POLYPECTOMY;  Surgeon: [name], MD;  Location: [place];  Service: Endoscopy;  Laterality: N/A;  . tooth abstraction    . TRANSURETHRAL RESECTION OF BLADDER TUMOR N/A 05/12/2019   Procedure: TRANSURETHRAL RESECTION OF BLADDER TUMOR (TURBT);  Surgeon: Billey Co, MD;  Location: [facility];  Service: Urology;  Laterality: N/A;  . VARICOCELE EXCISION      SOCIAL HISTORY: Social History   Socioeconomic History  . Marital status: Married    Spouse name: Not on file  . Number of children: Not on file  . Years of education: Not on file  . Highest education level: Not on file  Occupational History  . Not  on file  Tobacco Use  . Smoking status: Former Smoker    Types: Cigarettes    Quit date: 06/15/2013    Years since quitting: 6.1  . Smokeless tobacco: Never Used  Substance and Sexual Activity  . Alcohol use: No    Alcohol/week: 0.0 standard drinks  . Drug use: No  . Sexual activity: Yes  Other Topics Concern  . Not on file  Social History Narrative  . Not on file   Social  Determinants of Health   Financial Resource Strain:   . Difficulty of Paying Living Expenses:   Food Insecurity:   . Worried About Charity fundraiser in the Last Year:   . Arboriculturist in the Last Year:   Transportation Needs:   . Film/video editor (Medical):   Marland Kitchen Lack of Transportation (Non-Medical):   Physical Activity:   . Days of Exercise per Week:   . Minutes of Exercise per Session:   Stress:   . Feeling of Stress :   Social Connections:   . Frequency of Communication with Friends and Family:   . Frequency of Social Gatherings with Friends and Family:   . Attends Religious Services:   . Active Member of Clubs or Organizations:   . Attends Archivist Meetings:   Marland Kitchen Marital Status:   Intimate Partner Violence:   . Fear of Current or Ex-Partner:   . Emotionally Abused:   Marland Kitchen Physically Abused:   . Sexually Abused:     FAMILY HISTORY: Family History  Problem Relation Age of Onset  . Heart disease Father   . Hypertension Father   . COPD Father   . Colon cancer Maternal Grandmother   . Heart attack Paternal Grandfather     ALLERGIES:  is allergic to glucotrol [glipizide].  MEDICATIONS:  Current Outpatient Medications  Medication Sig Dispense Refill  . amLODipine (NORVASC) 2.5 MG tablet Take 1 tablet (2.5 mg total) by mouth daily. 30 tablet 0  . aspirin EC 81 MG tablet Take 81 mg by mouth daily.    Marland Kitchen axitinib (INLYTA) 1 MG tablet Take 2 tablets (2 mg total) by mouth 2 (two) times daily. Take with one 5mg  tablet twice daily. 120 tablet 0  . [place]-Glucosamine-Vit D (OSTEO BI-FLEX ONE PER DAY) TABS Take 1 tablet by mouth daily.    Marland Kitchen JARDIANCE 10 MG TABS tablet TAKE 1 TABLET BY MOUTH DAILY (Patient taking differently: Take 10 mg by mouth daily. ) 30 tablet 2  . lisinopril (PRINIVIL,ZESTRIL) 10 MG tablet TAKE 1 TABLET(10 MG) BY MOUTH DAILY (Patient taking differently: Take 10 mg by mouth daily. ) 90 tablet 1  . loperamide (IMODIUM) 2 MG capsule Take 1  capsule (2 mg total) by mouth See admin instructions. With onset of loose stool, take 4mg  followed by 2mg  every 2 hours until 12 hours have passed without loose bowel movement. Maximum: 16 mg/day 60 capsule 0  . meloxicam (MOBIC) 15 MG tablet Take 15 mg by mouth daily.     . metFORMIN (GLUCOPHAGE-XR) 500 MG 24 hr tablet Take 3 pills by mouth daily for 4-5 days, then increase to 4 pills daily (if tolerated) (Patient taking differently: Take 2,000 mg by mouth daily with breakfast. ) 360 tablet 0  . Multiple Vitamin (MULTIVITAMIN) tablet Take 1 tablet by mouth daily.    . Omega-3 Fatty Acids (FISH OIL) 1200 MG CAPS Take 1,200 mg by mouth daily.     . Omeprazole (PRILOSEC PO) Take 20  mg by mouth daily as needed (heart burn).     Marland Kitchen OZEMPIC, 0.25 OR 0.5 MG/DOSE, 2 MG/1.5ML SOPN     . pioglitazone (ACTOS) 45 MG tablet TAKE 1 TABLET(45 MG) BY MOUTH DAILY (Patient taking differently: Take 45 mg by mouth daily. ) 90 tablet 1  . prochlorperazine (COMPAZINE) 10 MG tablet Take 1 tablet (10 mg total) by mouth every 6 (six) hours as needed for nausea or vomiting. 15 tablet 0  . rosuvastatin (CRESTOR) 10 MG tablet Take 1 tablet (10 mg total) by mouth at bedtime. 90 tablet 1  . clobetasol cream (TEMOVATE) [id number] % Apply 1 application topically 2 (two) times daily. To affected skin; too strong for face, groin, underarms (Patient not taking: Reported on 07/17/2019) 30 g 2  . Glucose Blood (ACCU-CHEK AVIVA PLUS VI) by In Vitro route.    . INLYTA 5 MG tablet TAKE 1 TABLET BY MOUTH TWICE A DAY (12 HOURS APART) WITH A FULL GLASS OF WATER. MAY TAKE WITH OR WITHOUT FOOD. AVOID GRAPEFRUIT PRODUCTS. (Patient not taking: Reported on 07/17/2019) 60 tablet 0   No current facility-administered medications for this visit.     PHYSICAL EXAMINATION: ECOG PERFORMANCE STATUS: 0 - Asymptomatic Vitals:   08/07/19 0901  BP: (!) 141/94  Pulse: (!) 111  Temp: 97.8 F (36.6 C)  SpO2: 97%   Filed Weights   08/07/19 0901  Weight: 235  lb 9.6 oz (106.9 kg)    Physical Exam Constitutional:      General: He is not in acute distress. HENT:     Head: Normocephalic and atraumatic.  Eyes:     General: No scleral icterus.    Pupils: Pupils are equal, round, and reactive to light.  Cardiovascular:     Rate and Rhythm: Normal rate and regular rhythm.     Heart sounds: Normal heart sounds.  Pulmonary:     Effort: Pulmonary effort is normal. No respiratory distress.     Breath sounds: No wheezing.  Abdominal:     General: Bowel sounds are normal. There is no distension.     Palpations: Abdomen is soft. There is no mass.     Tenderness: There is no abdominal tenderness.  Musculoskeletal:        General: No deformity. Normal range of motion.     Cervical back: Normal range of motion and neck supple.  Skin:    General: Skin is warm and dry.     Findings: No erythema or rash.  Neurological:     Mental Status: He is alert and oriented to person, place, and time. Mental status is at baseline.     Cranial Nerves: No cranial nerve deficit.     Coordination: Coordination normal.  Psychiatric:        Mood and Affect: Mood normal.        Behavior: Behavior normal.        Thought Content: Thought content normal.     LABORATORY DATA:  I have reviewed the data as listed Lab Results  Component Value Date   WBC 6.3 08/07/2019   HGB 15.7 08/07/2019   HCT 47.0 08/07/2019   MCV 88.5 08/07/2019   PLT 217 08/07/2019   Recent Labs    07/17/19 0902 07/17/19 1748 08/07/19 0841  NA 136 135 132*  K 4.6 4.0 4.4  CL 99 100 98  CO2 25 24 24   GLUCOSE 200* 180* 258*  BUN 23* 22* 22*  CREATININE 1.18 1.13 1.31*  CALCIUM 9.5 9.4  9.6  GFRNONAA >60 >60 >60  GFRAA >60 >60 >60  PROT 7.2 7.8 7.1  ALBUMIN 4.3 4.5 4.3  AST 19 20 27   ALT 19 22 25   ALKPHOS 66 71 63  BILITOT 0.8 1.2 0.7   Iron/TIBC/Ferritin/ %Sat No results found for: IRON, TIBC, FERRITIN, IRONPCTSAT    RADIOGRAPHIC STUDIES: I have personally reviewed the  radiological images as listed and agreed with the findings in the report.  DG Eye Foreign Body  Result Date: 07/25/2019 CLINICAL DATA:  Metal working/exposure; clearance prior to MRI EXAM: ORBITS FOR FOREIGN BODY - 2 VIEW COMPARISON:  None. FINDINGS: Water's views with eyes deviated toward the left and toward the right obtained. No intraorbital radiopaque foreign body. Paranasal sinuses are clear. No fracture or dislocation. IMPRESSION: No evidence of metallic foreign body within the orbits. Electronically Signed   By: Lowella Grip Woodard M.D.   On: 07/25/2019 11:44   CT Head Wo Contrast  Result Date: 07/17/2019 CLINICAL DATA:  Acute headaches and history of renal cell carcinoma with metastatic disease EXAM: CT HEAD WITHOUT CONTRAST TECHNIQUE: Contiguous axial images were obtained from the base of the skull through the vertex without intravenous contrast. COMPARISON:  None. FINDINGS: Brain: No evidence of acute infarction, hemorrhage, hydrocephalus, extra-axial collection or mass lesion/mass effect. Vascular: No hyperdense vessel or unexpected calcification. Skull: Normal. Negative for fracture or focal lesion. Sinuses/Orbits: No acute finding. Other: None. IMPRESSION: Normal head CT for age Electronically Signed   By: Inez Catalina M.D.   On: 07/17/2019 21:25   CT Chest W Contrast  Result Date: 05/28/2019 CLINICAL DATA:  Renal cell carcinoma.  Staging. EXAM: CT CHEST WITH CONTRAST TECHNIQUE: Multidetector CT imaging of the chest was performed during intravenous contrast administration. CONTRAST:  67mL OMNIPAQUE IOHEXOL 300 MG/ML  SOLN COMPARISON:  None. FINDINGS: Cardiovascular: The heart size is normal. No substantial pericardial effusion. Mediastinum/Nodes: Left hilar lymphadenopathy measures up to 2.3 cm short axis. Upper normal mediastinal lymph nodes evident. The esophagus has normal imaging features. There is no axillary lymphadenopathy. Lungs/Pleura: Multiple bilateral pulmonary nodules measure up  to a maximum size of approximately 10 mm in the right middle lobe. Cluster of tree-in-bud nodularity in the left base likely reflects atypical infection. No pleural effusion. Upper Abdomen: Unremarkable. Musculoskeletal: No worrisome lytic or sclerotic osseous abnormality. IMPRESSION: 1. Numerous bilateral pulmonary nodules measuring up to 10 mm diameter. Imaging features compatible with metastatic disease. 2. Left hilar lymphadenopathy, also highly suspicious for metastatic involvement. Electronically Signed   By: Misty Stanley M.D.   On: 05/28/2019 13:14   MR BRAIN W WO CONTRAST  Result Date: 07/25/2019 CLINICAL DATA:  Severe intermittent headaches, history of renal cancer EXAM: MRI HEAD WITHOUT AND WITH CONTRAST TECHNIQUE: Multiplanar, multiecho pulse sequences of the brain and surrounding structures were obtained without and with intravenous contrast. CONTRAST:  20mL GADAVIST GADOBUTROL 1 MMOL/ML IV SOLN COMPARISON:  None. FINDINGS: Brain: There is no acute infarction or intracranial hemorrhage. There is no intracranial mass, mass effect, or edema. There is no hydrocephalus or extra-axial fluid collection. No abnormal enhancement. Vascular: Major vessel flow voids at the skull base are preserved. Skull and upper cervical spine: Normal marrow signal is preserved. Sinuses/Orbits: Paranasal sinuses are aerated. Orbits are unremarkable. Other: Sella is unremarkable.  Mastoid air cells are clear. IMPRESSION: No mass, hemorrhage, or abnormal enhancement. Electronically Signed   By: Macy Mis M.D.   On: 07/25/2019 15:03   NM Bone Scan Whole Body  Result Date: 05/29/2019 CLINICAL DATA:  Bladder tumor, history of clear cell carcinoma of the RIGHT kidney EXAM: NUCLEAR MEDICINE WHOLE BODY BONE SCAN TECHNIQUE: Whole body anterior and posterior images were obtained approximately 3 hours after intravenous injection of radiopharmaceutical. RADIOPHARMACEUTICALS:  22.457 mCi Technetium-51m MDP IV COMPARISON:  None  Correlation: CT chest 05/28/2019, CT abdomen and pelvis 04/30/2019 FINDINGS: Uptake at the shoulders, sternoclavicular joints, and knees, typically degenerative. Scattered physiologic uptake within costal cartilage. No worrisome sites of osseous tracer accumulation are identified to suggest osseous metastatic disease. Expected urinary tract and soft tissue distribution of tracer. IMPRESSION: No scintigraphic evidence of osseous metastatic disease. Electronically Signed   By: Lavonia Dana M.D.   On: 05/29/2019 08:21   DG OR UROLOGY CYSTO IMAGE ([place])  Result Date: 05/12/2019 There is no interpretation for this exam.  This order is for images obtained during a surgical procedure.  Please See "Surgeries" Tab for more information regarding the procedure.      ASSESSMENT & PLAN:  1. Renal cell carcinoma of right kidney metastatic to other site [facility])   2. Encounter for antineoplastic immunotherapy   3. Encounter for antineoplastic chemotherapy   4. Essential hypertension   5. 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 Status post 3 cycles of  Keytruda.   Labs are reviewed and discussed with patient. Proceed with cycle 4 Keytruda.  Resumed on reduced Axitinib 3mg  BID. I advise patient to finish his currently supply (1-2 weeks) then re-challenge with 5mg  BID. Advise him to call me if he experiences headache again.  Due to the interruption of treatment, I will obtain repeat image after next cycle of Keytruda.   #hypertension Continue lisinopril 10mg  daily and Norvasc 2.5mg  daily.  # Hyperglycemia due to uncontrolled DM. Can be side effects from Axitinib. Advise patient to be complaint with diabetic dieting. Follow up with endocrinology Check A1c.   We spent sufficient time to discuss many aspect of care, questions were answered to patient's satisfaction. All questions were answered. The patient knows to call the clinic with any problems  questions or concerns.  Return of visit: Follow-up in 3 weeks.    Earlie Server, MD, PhD Hematology Oncology [facility] Pager- [phone] 08/07/2019

## 2019-08-08 ENCOUNTER — Other Ambulatory Visit: Payer: Self-pay | Admitting: *Deleted

## 2019-08-11 ENCOUNTER — Other Ambulatory Visit: Payer: Self-pay | Admitting: *Deleted

## 2019-08-11 ENCOUNTER — Encounter: Payer: Self-pay | Admitting: Oncology

## 2019-08-11 MED ORDER — AMLODIPINE BESYLATE 2.5 MG PO TABS: 2.5000 mg | ORAL_TABLET | Freq: Every day | ORAL | 3 refills | Status: DC

## 2019-08-21 NOTE — Progress Notes (Signed)

## 2019-08-28 ENCOUNTER — Inpatient Hospital Stay: Payer: BC Managed Care – PPO

## 2019-08-28 ENCOUNTER — Inpatient Hospital Stay: Payer: BC Managed Care – PPO | Attending: Oncology

## 2019-08-28 ENCOUNTER — Encounter: Payer: Self-pay | Admitting: Oncology

## 2019-08-28 ENCOUNTER — Other Ambulatory Visit: Payer: Self-pay

## 2019-08-28 ENCOUNTER — Inpatient Hospital Stay (HOSPITAL_BASED_OUTPATIENT_CLINIC_OR_DEPARTMENT_OTHER): Payer: BC Managed Care – PPO | Admitting: Oncology

## 2019-08-28 VITALS — BP 133/88 | HR 76 | Temp 94.2°F | Resp 16 | Wt 229.7 lb

## 2019-08-28 DIAGNOSIS — Z7982 Long term (current) use of aspirin: Secondary | ICD-10-CM | POA: Insufficient documentation

## 2019-08-28 DIAGNOSIS — C7911 Secondary malignant neoplasm of bladder: Secondary | ICD-10-CM | POA: Diagnosis not present

## 2019-08-28 DIAGNOSIS — C641 Malignant neoplasm of right kidney, except renal pelvis: Secondary | ICD-10-CM | POA: Diagnosis not present

## 2019-08-28 DIAGNOSIS — Z87891 Personal history of nicotine dependence: Secondary | ICD-10-CM | POA: Diagnosis not present

## 2019-08-28 DIAGNOSIS — R739 Hyperglycemia, unspecified: Secondary | ICD-10-CM

## 2019-08-28 DIAGNOSIS — Z8249 Family history of ischemic heart disease and other diseases of the circulatory system: Secondary | ICD-10-CM | POA: Insufficient documentation

## 2019-08-28 DIAGNOSIS — I10 Essential (primary) hypertension: Secondary | ICD-10-CM | POA: Insufficient documentation

## 2019-08-28 DIAGNOSIS — Z791 Long term (current) use of non-steroidal anti-inflammatories (NSAID): Secondary | ICD-10-CM | POA: Insufficient documentation

## 2019-08-28 DIAGNOSIS — K219 Gastro-esophageal reflux disease without esophagitis: Secondary | ICD-10-CM | POA: Diagnosis not present

## 2019-08-28 DIAGNOSIS — Z5111 Encounter for antineoplastic chemotherapy: Secondary | ICD-10-CM

## 2019-08-28 DIAGNOSIS — Z800 Family history of malignant neoplasm of digestive organs: Secondary | ICD-10-CM | POA: Diagnosis not present

## 2019-08-28 DIAGNOSIS — E1165 Type 2 diabetes mellitus with hyperglycemia: Secondary | ICD-10-CM | POA: Insufficient documentation

## 2019-08-28 DIAGNOSIS — C7800 Secondary malignant neoplasm of unspecified lung: Secondary | ICD-10-CM | POA: Diagnosis present

## 2019-08-28 DIAGNOSIS — E785 Hyperlipidemia, unspecified: Secondary | ICD-10-CM | POA: Insufficient documentation

## 2019-08-28 DIAGNOSIS — Z905 Acquired absence of kidney: Secondary | ICD-10-CM | POA: Insufficient documentation

## 2019-08-28 DIAGNOSIS — Z5112 Encounter for antineoplastic immunotherapy: Secondary | ICD-10-CM | POA: Insufficient documentation

## 2019-08-28 DIAGNOSIS — Z7984 Long term (current) use of oral hypoglycemic drugs: Secondary | ICD-10-CM | POA: Insufficient documentation

## 2019-08-28 DIAGNOSIS — Z79899 Other long term (current) drug therapy: Secondary | ICD-10-CM | POA: Insufficient documentation

## 2019-08-28 LAB — CBC WITH DIFFERENTIAL/PLATELET
Abs Immature Granulocytes: 0.02 10*3/uL (ref 0.00–0.07)
Basophils Absolute: 0 10*3/uL (ref 0.0–0.1)
Basophils Relative: 0 %
Eosinophils Absolute: 0.3 10*3/uL (ref 0.0–0.5)
Eosinophils Relative: 7 %
HCT: 47.1 % (ref 39.0–52.0)
Hemoglobin: 15.7 g/dL (ref 13.0–17.0)
Immature Granulocytes: 0 %
Lymphocytes Relative: 35 %
Lymphs Abs: 1.7 10*3/uL (ref 0.7–4.0)
MCH: 29.5 pg (ref 26.0–34.0)
MCHC: 33.3 g/dL (ref 30.0–36.0)
MCV: 88.5 fL (ref 80.0–100.0)
Monocytes Absolute: 0.6 10*3/uL (ref 0.1–1.0)
Monocytes Relative: 12 %
Neutro Abs: 2.2 10*3/uL (ref 1.7–7.7)
Neutrophils Relative %: 46 %
Platelets: 192 10*3/uL (ref 150–400)
RBC: 5.32 MIL/uL (ref 4.22–5.81)
RDW: 12.7 % (ref 11.5–15.5)
WBC: 4.8 10*3/uL (ref 4.0–10.5)
nRBC: 0 % (ref 0.0–0.2)

## 2019-08-28 LAB — COMPREHENSIVE METABOLIC PANEL
ALT: 21 U/L (ref 0–44)
AST: 18 U/L (ref 15–41)
Albumin: 4.1 g/dL (ref 3.5–5.0)
Alkaline Phosphatase: 58 U/L (ref 38–126)
Anion gap: 11 (ref 5–15)
BUN: 27 mg/dL — ABNORMAL HIGH (ref 6–20)
CO2: 26 mmol/L (ref 22–32)
Calcium: 9 mg/dL (ref 8.9–10.3)
Chloride: 99 mmol/L (ref 98–111)
Creatinine, Ser: 1.28 mg/dL — ABNORMAL HIGH (ref 0.61–1.24)
GFR calc Af Amer: >60 mL/min (ref >60)
GFR calc non Af Amer: >60 mL/min (ref >60)
Glucose, Bld: 121 mg/dL — ABNORMAL HIGH (ref 70–99)
Potassium: 4.2 mmol/L (ref 3.5–5.1)
Sodium: 136 mmol/L (ref 135–145)
Total Bilirubin: 1.1 mg/dL (ref 0.3–1.2)
Total Protein: 7.1 g/dL (ref 6.5–8.1)

## 2019-08-28 LAB — HEMOGLOBIN A1C
Hgb A1c MFr Bld: 7.7 % — ABNORMAL HIGH (ref 4.8–5.6)
Mean Plasma Glucose: 174.29 mg/dL

## 2019-08-28 LAB — PROTEIN, URINE, RANDOM: Total Protein, Urine: 23 mg/dL

## 2019-08-28 MED ORDER — SODIUM CHLORIDE 0.9 % IV SOLN
Freq: Once | INTRAVENOUS | Status: AC
  Filled 2019-08-28: qty 250

## 2019-08-28 MED ORDER — AXITINIB 5 MG PO TABS: ORAL_TABLET | ORAL | 0 refills | Status: DC

## 2019-08-28 MED ORDER — SODIUM CHLORIDE 0.9 % IV SOLN
200.0000 mg | Freq: Once | INTRAVENOUS | Status: AC
  Administered 2019-08-28: 200 mg via INTRAVENOUS
  Filled 2019-08-28: qty 8

## 2019-08-28 NOTE — Progress Notes (Signed)
Patient has lost 6 lbs and he contributes that to new Ozempic curbing his appetite.

## 2019-08-28 NOTE — Progress Notes (Signed)
Hematology/Oncology follow up note [facility] Telephone:([phone] Fax:([phone]   Patient Care Team: [name], NP as PCP - General [name], [name], MD as PCP - Family Medicine (Family Medicine) [name], MD as Consulting Physician (General Surgery) [name], [name], MD as Attending Physician (Family Medicine) [name], [name], MD as Consulting Physician (Dermatology)  REFERRING PROVIDER: [name], NP  CHIEF COMPLAINTS/REASON FOR VISIT:  Evaluation of kidney cancer  HISTORY OF PRESENTING ILLNESS:   Matthew Woodard is a  54 y.o.  male with PMH listed below was seen in consultation at the request of  [name], NP  for evaluation of kidney cancer Patient's cancer history dated back to February 2020 when he developed gross hematuria with small clots and right-sided flank pain.  Patient had a CT urogram done which showed right 4 cm central enhancing renal mass with invasion into the collecting system.  X-ray of chest was performed to see complete staging which showed no concerning findings. 06/28/2018 patient underwent a cystoscopy, bladder biopsy and a right retrograde pyelogram with intraoperative interpretation, right diagnostic ureteroscopy, right renal pelvis biopsy, right ureteral stent placement.  biopsy showed atypical cell clusters, with extensive crush artifact, nondiagnostic.   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  Surgical margins negative for tumor.pT3a,Nx Patient has been on surveillance after surgery. 10/28/2018 CT abdomen pelvis with contrast showed minimal fluid and or postoperative changes within the right renal fossa.  No evidence of metastatic disease or recurrence.  Patient has left kidney lesion previously characterized as  nonenhancing cyst by multi phasic contrast-enhanced CT. Patient reports an episode of gross hematuria in early December 2020, patient is due for 45-month surveillance CT scan. 04/30/2019 CT abdomen pelvis with contrast showed high attenuation mass at the right uretero vesicle junction, with extension into the bladder.  Bilateral pulmonary nodules, most indicated for metastatic disease.   Patient underwent cystoscopy and transurethral resection of irregular bladder tumor 3 cm.  Mass appeared to emanate from the right ureteral orifice.  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 Patient was referred to me for further work-up and management.  #Staging chest CT with contrast showed numerous bilateral pulmonary nodules.  Compatible with metastatic disease. Bone scan showed no osseous metastatic disease.   #History of tobacco abuse, former smoker.  Quit in 2015.  He denies any hemoptysis, chest pain, shortness of breath today. Patient reports that hematuria has completely resolved.  Denies any pain today. He is accompanied by his wife.  #History of cutaneous psoriasis, mostly on his right hand, currently on weekly methotrexate 12.5 mg with good symptom control. #NGS: [facility] PD L1 TPS 1%  # patient was referred to ENT for evaluation of headache and sinus pressure.  He had a ENT evaluation and feels that patient does not have sinusitis.  [name] recommended MRI brain to rule out PRES syndrome.  Axitinib has been held MRI brain was done and was negative.   INTERVAL HISTORY Matthew Woodard is a 54 y.o. male who has above history reviewed by me today presents for follow up visit for management of metastatic RCC,    Problems and complaints are listed below: He was resumed axitinib and increased dose to 5 mg twice daily for about a week. Patient reports tolerating therapy well.  No additional episodes  of headache Blood pressure has been stable at  133/88 today at the clinic.  No new complaints.  He was accompanied by wife.   Review of Systems  Constitutional: Negative for appetite change, chills, fatigue, fever and unexpected weight change.  HENT:   Negative for hearing loss and voice change.   Eyes: Negative for eye problems and icterus.  Respiratory: Negative for chest tightness, cough and shortness of breath.   Cardiovascular: Negative for chest pain and leg swelling.  Gastrointestinal: Negative for abdominal distention, abdominal pain, blood in stool and nausea.  Endocrine: Negative for hot flashes.  Genitourinary: Negative for difficulty urinating, dysuria and frequency.   Musculoskeletal: Negative for arthralgias.  Skin: Negative for itching and rash.  Neurological: Negative for extremity weakness, headaches, light-headedness and numbness.  Hematological: Negative for adenopathy. Does not bruise/bleed easily.  Psychiatric/Behavioral: Negative for confusion.    MEDICAL HISTORY:  Past Medical History:  Diagnosis Date  . Arthritis    hands, left knee  . Cancer ([place])   . Diabetes mellitus without complication ([place])   . Eczema 01/08/2017  . GERD (gastroesophageal reflux disease)   . History of kidney cancer   . Hyperlipidemia LDL goal <100 11/02/2014  . Hypertension   . Kidney stone    YEAR H/O HEMATURIA  . Psoriasis   . Wears dentures    full upper, partial lower.  Has, does not wear    SURGICAL HISTORY: Past Surgical History:  Procedure Laterality Date  . COLONOSCOPY WITH PROPOFOL N/A 02/23/2017   Procedure: COLONOSCOPY WITH PROPOFOL;  Surgeon: [name], MD;  Location: [place];  Service: Endoscopy;  Laterality: N/A;  Diabetic - oral meds  . CYST EXCISION  Aug. 2016   on back  . CYSTOSCOPY W/ RETROGRADES Right 06/28/2018   Procedure: CYSTOSCOPY WITH RETROGRADE PYELOGRAM;  Surgeon: Billey Co, MD;  Location: [facility];  Service: Urology;  Laterality: Right;  . CYSTOSCOPY W/ URETERAL STENT  REMOVAL Right 07/22/2018   Procedure: CYSTOSCOPY WITH STENT REMOVAL;  Surgeon: Billey Co, MD;  Location: [facility];  Service: Urology;  Laterality: Right;  . CYSTOSCOPY WITH BIOPSY Right 06/28/2018   Procedure: CYSTOSCOPY WITH RENAL BIOPSY;  Surgeon: Billey Co, MD;  Location: [facility];  Service: Urology;  Laterality: Right;  . CYSTOSCOPY WITH URETEROSCOPY AND STENT PLACEMENT Right 06/28/2018   Procedure: CYSTOSCOPY WITH URETEROSCOPY AND STENT PLACEMENT;  Surgeon: Billey Co, MD;  Location: [facility];  Service: Urology;  Laterality: Right;  . FULGURATION OF BLADDER TUMOR N/A 06/28/2018   Procedure: CYSTOSCOPY BLADDER BIOPSY, FULGERATION OF BLADDER;  Surgeon: Billey Co, MD;  Location: [facility];  Service: Urology;  Laterality: N/A;  . KNEE SURGERY Left   . LAPAROSCOPIC NEPHRECTOMY, HAND ASSISTED Right 07/22/2018   Procedure: HAND ASSISTED LAPAROSCOPIC NEPHRECTOMY;  Surgeon: Billey Co, MD;  Location: [facility];  Service: Urology;  Laterality: Right;  . NASAL SINUS SURGERY  03/15  . POLYPECTOMY N/A 02/23/2017   Procedure: POLYPECTOMY;  Surgeon: [name], MD;  Location: [place];  Service: Endoscopy;  Laterality: N/A;  . tooth abstraction    . TRANSURETHRAL RESECTION OF BLADDER TUMOR N/A 05/12/2019   Procedure: TRANSURETHRAL RESECTION OF BLADDER TUMOR (TURBT);  Surgeon: Billey Co, MD;  Location: [facility];  Service: Urology;  Laterality: N/A;  . VARICOCELE EXCISION      SOCIAL HISTORY: Social History   Socioeconomic History  . Marital status: Married    Spouse name: Not on file  .  Number of children: Not on file  . Years of education: Not on file  . Highest education level: Not on file  Occupational History  . Not on file  Tobacco Use  . Smoking status: Former Smoker    Types: Cigarettes    Quit date: 06/15/2013    Years since quitting: 6.2  . Smokeless tobacco: Never Used  Substance and Sexual Activity  . Alcohol use: No    Alcohol/week:  0.0 standard drinks  . Drug use: No  . Sexual activity: Yes  Other Topics Concern  . Not on file  Social History Narrative  . Not on file   Social Determinants of Health   Financial Resource Strain:   . Difficulty of Paying Living Expenses:   Food Insecurity:   . Worried About Charity fundraiser in the Last Year:   . Arboriculturist in the Last Year:   Transportation Needs:   . Film/video editor (Medical):   Marland Kitchen Lack of Transportation (Non-Medical):   Physical Activity:   . Days of Exercise per Week:   . Minutes of Exercise per Session:   Stress:   . Feeling of Stress :   Social Connections:   . Frequency of Communication with Friends and Family:   . Frequency of Social Gatherings with Friends and Family:   . Attends Religious Services:   . Active Member of Clubs or Organizations:   . Attends Archivist Meetings:   Marland Kitchen Marital Status:   Intimate Partner Violence:   . Fear of Current or Ex-Partner:   . Emotionally Abused:   Marland Kitchen Physically Abused:   . Sexually Abused:     FAMILY HISTORY: Family History  Problem Relation Age of Onset  . Heart disease Father   . Hypertension Father   . COPD Father   . Colon cancer Maternal Grandmother   . Heart attack Paternal Grandfather     ALLERGIES:  is allergic to glucotrol [glipizide].  MEDICATIONS:  Current Outpatient Medications  Medication Sig Dispense Refill  . amLODipine (NORVASC) 2.5 MG tablet Take 1 tablet (2.5 mg total) by mouth daily. 30 tablet 3  . aspirin EC 81 MG tablet Take 81 mg by mouth daily.    Marland Kitchen axitinib (INLYTA) 5 MG tablet TAKE 1 TABLET BY MOUTH TWICE A DAY (12 HOURS APART) WITH A FULL GLASS OF WATER. MAY TAKE WITH OR WITHOUT FOOD. AVOID GRAPEFRUIT PRODUCTS. 60 tablet 0  . [place]-Glucosamine-Vit D (OSTEO BI-FLEX ONE PER DAY) TABS Take 1 tablet by mouth daily.    . Glucose Blood (ACCU-CHEK AVIVA PLUS VI) by In Vitro route.    Marland Kitchen JARDIANCE 10 MG TABS tablet TAKE 1 TABLET BY MOUTH DAILY (Patient  taking differently: Take 10 mg by mouth daily. ) 30 tablet 2  . lisinopril (PRINIVIL,ZESTRIL) 10 MG tablet TAKE 1 TABLET(10 MG) BY MOUTH DAILY (Patient taking differently: Take 10 mg by mouth daily. ) 90 tablet 1  . meloxicam (MOBIC) 15 MG tablet Take 15 mg by mouth daily.     . metFORMIN (GLUCOPHAGE-XR) 500 MG 24 hr tablet Take 3 pills by mouth daily for 4-5 days, then increase to 4 pills daily (if tolerated) (Patient taking differently: Take 2,000 mg by mouth daily with breakfast. ) 360 tablet 0  . Multiple Vitamin (MULTIVITAMIN) tablet Take 1 tablet by mouth daily.    . Omega-3 Fatty Acids (FISH OIL) 1200 MG CAPS Take 1,200 mg by mouth daily.     . Omeprazole (PRILOSEC  PO) Take 20 mg by mouth daily as needed (heart burn).     Marland Kitchen OZEMPIC, 0.25 OR 0.5 MG/DOSE, 2 MG/1.5ML SOPN     . pioglitazone (ACTOS) 45 MG tablet TAKE 1 TABLET(45 MG) BY MOUTH DAILY (Patient taking differently: Take 45 mg by mouth daily. ) 90 tablet 1  . prochlorperazine (COMPAZINE) 10 MG tablet Take 1 tablet (10 mg total) by mouth every 6 (six) hours as needed for nausea or vomiting. 15 tablet 0  . rosuvastatin (CRESTOR) 10 MG tablet Take 1 tablet (10 mg total) by mouth at bedtime. 90 tablet 1  . clobetasol cream (TEMOVATE) [id number] % Apply 1 application topically 2 (two) times daily. To affected skin; too strong for face, groin, underarms (Patient not taking: Reported on 07/17/2019) 30 g 2  . loperamide (IMODIUM) 2 MG capsule Take 1 capsule (2 mg total) by mouth See admin instructions. With onset of loose stool, take 4mg  followed by 2mg  every 2 hours until 12 hours have passed without loose bowel movement. Maximum: 16 mg/day (Patient not taking: Reported on 08/28/2019) 60 capsule 0   No current facility-administered medications for this visit.     PHYSICAL EXAMINATION: ECOG PERFORMANCE STATUS: 0 - Asymptomatic Vitals:   08/28/19 0915  BP: 133/88  Pulse: 76  Resp: 16  Temp: (!) 94.2 F (34.6 C)   Filed Weights   08/28/19 0915   Weight: 229 lb 11.2 oz (104.2 kg)    Physical Exam Constitutional:      General: He is not in acute distress. HENT:     Head: Normocephalic and atraumatic.  Eyes:     General: No scleral icterus.    Pupils: Pupils are equal, round, and reactive to light.  Cardiovascular:     Rate and Rhythm: Normal rate and regular rhythm.     Heart sounds: Normal heart sounds.  Pulmonary:     Effort: Pulmonary effort is normal. No respiratory distress.     Breath sounds: No wheezing.  Abdominal:     General: Bowel sounds are normal. There is no distension.     Palpations: Abdomen is soft. There is no mass.     Tenderness: There is no abdominal tenderness.  Musculoskeletal:        General: No deformity. Normal range of motion.     Cervical back: Normal range of motion and neck supple.  Skin:    General: Skin is warm and dry.     Findings: No erythema or rash.  Neurological:     Mental Status: He is alert and oriented to person, place, and time. Mental status is at baseline.     Cranial Nerves: No cranial nerve deficit.     Coordination: Coordination normal.  Psychiatric:        Mood and Affect: Mood normal.     LABORATORY DATA:  I have reviewed the data as listed Lab Results  Component Value Date   WBC 4.8 08/28/2019   HGB 15.7 08/28/2019   HCT 47.1 08/28/2019   MCV 88.5 08/28/2019   PLT 192 08/28/2019   Recent Labs    07/17/19 1748 08/07/19 0841 08/28/19 0849  NA 135 132* 136  K 4.0 4.4 4.2  CL 100 98 99  CO2 24 24 26   GLUCOSE 180* 258* 121*  BUN 22* 22* 27*  CREATININE 1.13 1.31* 1.28*  CALCIUM 9.4 9.6 9.0  GFRNONAA >60 >60 >60  GFRAA >60 >60 >60  PROT 7.8 7.1 7.1  ALBUMIN 4.5 4.3 4.1  AST  20 27 18   ALT 22 25 21   ALKPHOS 71 63 58  BILITOT 1.2 0.7 1.1   Iron/TIBC/Ferritin/ %Sat No results found for: IRON, TIBC, FERRITIN, IRONPCTSAT    RADIOGRAPHIC STUDIES: I have personally reviewed the radiological images as listed and agreed with the findings in the  report.  DG Eye Foreign Body  Result Date: 07/25/2019 CLINICAL DATA:  Metal working/exposure; clearance prior to MRI EXAM: ORBITS FOR FOREIGN BODY - 2 VIEW COMPARISON:  None. FINDINGS: Water's views with eyes deviated toward the left and toward the right obtained. No intraorbital radiopaque foreign body. Paranasal sinuses are clear. No fracture or dislocation. IMPRESSION: No evidence of metallic foreign body within the orbits. Electronically Signed   By: Lowella Grip [facility] M.D.   On: 07/25/2019 11:44   CT Head Wo Contrast  Result Date: 07/17/2019 CLINICAL DATA:  Acute headaches and history of renal cell carcinoma with metastatic disease EXAM: CT HEAD WITHOUT CONTRAST TECHNIQUE: Contiguous axial images were obtained from the base of the skull through the vertex without intravenous contrast. COMPARISON:  None. FINDINGS: Brain: No evidence of acute infarction, hemorrhage, hydrocephalus, extra-axial collection or mass lesion/mass effect. Vascular: No hyperdense vessel or unexpected calcification. Skull: Normal. Negative for fracture or focal lesion. Sinuses/Orbits: No acute finding. Other: None. IMPRESSION: Normal head CT for age Electronically Signed   By: Inez Catalina M.D.   On: 07/17/2019 21:25   MR BRAIN W WO CONTRAST  Result Date: 07/25/2019 CLINICAL DATA:  Severe intermittent headaches, history of renal cancer EXAM: MRI HEAD WITHOUT AND WITH CONTRAST TECHNIQUE: Multiplanar, multiecho pulse sequences of the brain and surrounding structures were obtained without and with intravenous contrast. CONTRAST:  27mL GADAVIST GADOBUTROL 1 MMOL/ML IV SOLN COMPARISON:  None. FINDINGS: Brain: There is no acute infarction or intracranial hemorrhage. There is no intracranial mass, mass effect, or edema. There is no hydrocephalus or extra-axial fluid collection. No abnormal enhancement. Vascular: Major vessel flow voids at the skull base are preserved. Skull and upper cervical spine: Normal marrow signal is preserved.  Sinuses/Orbits: Paranasal sinuses are aerated. Orbits are unremarkable. Other: Sella is unremarkable.  Mastoid air cells are clear. IMPRESSION: No mass, hemorrhage, or abnormal enhancement. Electronically Signed   By: Macy Mis M.D.   On: 07/25/2019 15:03      ASSESSMENT & PLAN:  1. Clear cell carcinoma of right kidney ([place])   2. Malignant neoplasm of right kidney ([place])   3. 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 Currently on axitinib 5 mg twice daily.  No additional episodes of headache. Recommend him to continue current dosage.  In the future, may consider increased dose gradually. He tolerates Keytruda treatments.  Labs reviewed and discussed with patient. Proceed with today's Keytruda treatment.  Obtain CT chest abdomen pelvis for evaluation of treatment response . # Hyperglycemia due to uncontrolled DM.  Glucose level are better today.  121.  Check A1c.  Axitinib may contribute to hypoglycemia.  Advised strict diabetic diet.  We spent sufficient time to discuss many aspect of care, questions were answered to patient's satisfaction. All questions were answered. The patient knows to call the clinic with any problems questions or concerns.  Return of visit: Follow-up in 3 weeks.    Earlie Server, MD, PhD Hematology Oncology [facility] at [facility] Pager- [phone] 08/28/2019

## 2019-09-08 DIAGNOSIS — I10 Essential (primary) hypertension: Secondary | ICD-10-CM | POA: Insufficient documentation

## 2019-09-08 DIAGNOSIS — C649 Malignant neoplasm of unspecified kidney, except renal pelvis: Secondary | ICD-10-CM | POA: Insufficient documentation

## 2019-09-11 NOTE — Progress Notes (Signed)

## 2019-09-12 ENCOUNTER — Other Ambulatory Visit: Payer: Self-pay

## 2019-09-12 ENCOUNTER — Ambulatory Visit
Admission: RE | Admit: 2019-09-12 | Discharge: 2019-09-12 | Disposition: A | Discharge status: Home / Self Care | Payer: BC Managed Care – PPO | Source: Ambulatory Visit | Attending: Oncology | Admitting: Oncology

## 2019-09-12 DIAGNOSIS — C641 Malignant neoplasm of right kidney, except renal pelvis: Secondary | ICD-10-CM | POA: Insufficient documentation

## 2019-09-12 MED ORDER — IOHEXOL 300 MG/ML  SOLN
100.0000 mL | Freq: Once | INTRAMUSCULAR | Status: AC | PRN
  Administered 2019-09-12: 100 mL via INTRAVENOUS

## 2019-09-17 ENCOUNTER — Ambulatory Visit: Payer: BC Managed Care – PPO | Admitting: Urology

## 2019-09-18 ENCOUNTER — Encounter: Payer: Self-pay | Admitting: Oncology

## 2019-09-18 ENCOUNTER — Other Ambulatory Visit: Payer: Self-pay

## 2019-09-18 ENCOUNTER — Inpatient Hospital Stay: Payer: BC Managed Care – PPO

## 2019-09-18 ENCOUNTER — Inpatient Hospital Stay (HOSPITAL_BASED_OUTPATIENT_CLINIC_OR_DEPARTMENT_OTHER): Payer: BC Managed Care – PPO | Admitting: Oncology

## 2019-09-18 ENCOUNTER — Other Ambulatory Visit: Payer: Self-pay | Admitting: Oncology

## 2019-09-18 ENCOUNTER — Inpatient Hospital Stay: Payer: BC Managed Care – PPO | Attending: Oncology

## 2019-09-18 VITALS — BP 143/89 | HR 98 | Temp 95.8°F | Resp 18 | Wt 219.5 lb

## 2019-09-18 DIAGNOSIS — C7911 Secondary malignant neoplasm of bladder: Secondary | ICD-10-CM | POA: Diagnosis not present

## 2019-09-18 DIAGNOSIS — R7401 Elevation of levels of liver transaminase levels: Secondary | ICD-10-CM

## 2019-09-18 DIAGNOSIS — R945 Abnormal results of liver function studies: Secondary | ICD-10-CM | POA: Insufficient documentation

## 2019-09-18 DIAGNOSIS — R519 Headache, unspecified: Secondary | ICD-10-CM | POA: Insufficient documentation

## 2019-09-18 DIAGNOSIS — C649 Malignant neoplasm of unspecified kidney, except renal pelvis: Secondary | ICD-10-CM | POA: Insufficient documentation

## 2019-09-18 DIAGNOSIS — Z5112 Encounter for antineoplastic immunotherapy: Secondary | ICD-10-CM

## 2019-09-18 DIAGNOSIS — Z5111 Encounter for antineoplastic chemotherapy: Secondary | ICD-10-CM

## 2019-09-18 DIAGNOSIS — C641 Malignant neoplasm of right kidney, except renal pelvis: Secondary | ICD-10-CM

## 2019-09-18 DIAGNOSIS — Z87891 Personal history of nicotine dependence: Secondary | ICD-10-CM | POA: Diagnosis not present

## 2019-09-18 DIAGNOSIS — M170 Bilateral primary osteoarthritis of knee: Secondary | ICD-10-CM

## 2019-09-18 DIAGNOSIS — C7800 Secondary malignant neoplasm of unspecified lung: Secondary | ICD-10-CM | POA: Insufficient documentation

## 2019-09-18 DIAGNOSIS — Z905 Acquired absence of kidney: Secondary | ICD-10-CM | POA: Diagnosis not present

## 2019-09-18 LAB — COMPREHENSIVE METABOLIC PANEL
ALT: 297 U/L — ABNORMAL HIGH (ref 0–44)
AST: 192 U/L — ABNORMAL HIGH (ref 15–41)
Albumin: 4.4 g/dL (ref 3.5–5.0)
Alkaline Phosphatase: 69 U/L (ref 38–126)
Anion gap: 12 (ref 5–15)
BUN: 24 mg/dL — ABNORMAL HIGH (ref 6–20)
CO2: 25 mmol/L (ref 22–32)
Calcium: 9.5 mg/dL (ref 8.9–10.3)
Chloride: 99 mmol/L (ref 98–111)
Creatinine, Ser: 1.27 mg/dL — ABNORMAL HIGH (ref 0.61–1.24)
GFR calc Af Amer: >60 mL/min (ref >60)
GFR calc non Af Amer: >60 mL/min (ref >60)
Glucose, Bld: 101 mg/dL — ABNORMAL HIGH (ref 70–99)
Potassium: 4.2 mmol/L (ref 3.5–5.1)
Sodium: 136 mmol/L (ref 135–145)
Total Bilirubin: 1.2 mg/dL (ref 0.3–1.2)
Total Protein: 7.5 g/dL (ref 6.5–8.1)

## 2019-09-18 LAB — CBC WITH DIFFERENTIAL/PLATELET
Abs Immature Granulocytes: 0.02 10*3/uL (ref 0.00–0.07)
Basophils Absolute: 0.1 10*3/uL (ref 0.0–0.1)
Basophils Relative: 1 %
Eosinophils Absolute: 0.5 10*3/uL (ref 0.0–0.5)
Eosinophils Relative: 8 %
HCT: 48.5 % (ref 39.0–52.0)
Hemoglobin: 16.4 g/dL (ref 13.0–17.0)
Immature Granulocytes: 0 %
Lymphocytes Relative: 30 %
Lymphs Abs: 2 10*3/uL (ref 0.7–4.0)
MCH: 29.6 pg (ref 26.0–34.0)
MCHC: 33.8 g/dL (ref 30.0–36.0)
MCV: 87.5 fL (ref 80.0–100.0)
Monocytes Absolute: 0.6 10*3/uL (ref 0.1–1.0)
Monocytes Relative: 10 %
Neutro Abs: 3.3 10*3/uL (ref 1.7–7.7)
Neutrophils Relative %: 51 %
Platelets: 231 10*3/uL (ref 150–400)
RBC: 5.54 MIL/uL (ref 4.22–5.81)
RDW: 12.8 % (ref 11.5–15.5)
WBC: 6.5 10*3/uL (ref 4.0–10.5)
nRBC: 0 % (ref 0.0–0.2)

## 2019-09-18 LAB — PROTEIN, URINE, RANDOM: Total Protein, Urine: 32 mg/dL

## 2019-09-18 MED ORDER — SODIUM CHLORIDE 0.9 % IV SOLN: 200.0000 mg | Freq: Once | INTRAVENOUS | Status: DC

## 2019-09-18 MED ORDER — SODIUM CHLORIDE 0.9 % IV SOLN
Freq: Once | INTRAVENOUS | Status: AC
  Filled 2019-09-18: qty 250

## 2019-09-18 MED ORDER — DICLOFENAC SODIUM 1 % EX GEL: 2.0000 g | Freq: Four times a day (QID) | CUTANEOUS | 1 refills | Status: DC

## 2019-09-18 NOTE — Progress Notes (Signed)
Patient has lost 10 lbs since last visit but he thinks it is due to [place] and he does have a good appetite.

## 2019-09-18 NOTE — Progress Notes (Addendum)
Hematology/Oncology follow up note [facility] [phone] [phone]   Patient Care Team: [name], NP as PCP - General [name], [name], MD as PCP - Family Medicine (Family Medicine) [name], MD as Consulting Physician (General Surgery) [name], [name], MD as Attending Physician (Family Medicine) [name], [name], MD as Consulting Physician (Dermatology)  REFERRING PROVIDER: [name], NP  CHIEF COMPLAINTS/REASON FOR VISIT:  Evaluation of kidney cancer  HISTORY OF PRESENTING ILLNESS:   Matthew Woodard is a  54 y.o.  male with PMH listed below was seen in consultation at the request of  [name], NP  for evaluation of kidney cancer Patient's cancer history dated back to February 2020 when he developed gross hematuria with small clots and right-sided flank pain.  Patient had a CT urogram done which showed right 4 cm central enhancing renal mass with invasion into the collecting system.  X-ray of chest was performed to see complete staging which showed no concerning findings. 06/28/2018 patient underwent a cystoscopy, bladder biopsy and a right retrograde pyelogram with intraoperative interpretation, right diagnostic ureteroscopy, right renal pelvis biopsy, right ureteral stent placement.  biopsy showed atypical cell clusters, with extensive crush artifact, nondiagnostic.   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  Surgical margins negative for tumor.pT3a,Nx Patient has been on surveillance after surgery. 10/28/2018 CT abdomen pelvis with contrast showed minimal fluid and or postoperative changes within the right renal fossa.  No evidence of metastatic disease or recurrence.  Patient has left kidney lesion previously characterized as  nonenhancing cyst by multi phasic contrast-enhanced CT. Patient reports an episode of gross hematuria in early December 2020, patient is due for 41-month surveillance CT scan. 04/30/2019 CT abdomen pelvis with contrast showed high attenuation mass at the right uretero vesicle junction, with extension into the bladder.  Bilateral pulmonary nodules, most indicated for metastatic disease.   Patient underwent cystoscopy and transurethral resection of irregular bladder tumor 3 cm.  Mass appeared to emanate from the right ureteral orifice.  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 Patient was referred to me for further work-up and management.  #Staging chest CT with contrast showed numerous bilateral pulmonary nodules.  Compatible with metastatic disease. Bone scan showed no osseous metastatic disease.   #History of tobacco abuse, former smoker.  Quit in 2015.  He denies any hemoptysis, chest pain, shortness of breath today. Patient reports that hematuria has completely resolved.  Denies any pain today. He is accompanied by his wife.  #History of cutaneous psoriasis, mostly on his right hand, currently on weekly methotrexate 12.5 mg with good symptom control. #NGS: [facility] PD L1 TPS 1%  # patient was referred to ENT for evaluation of headache and sinus pressure.  He had a ENT evaluation and feels that patient does not have sinusitis.  [name] recommended MRI brain to rule out PRES syndrome.  Axitinib has been held MRI brain was done and was negative.   INTERVAL HISTORY NATTHEW GEBRE Woodard is a 54 y.o. male who has above history reviewed by me today presents for follow up visit for management of metastatic RCC,  Problems and complaints are listed below: He is on axitinib 5 mg twice daily.  He tolerates well.  Blood pressure has been stable. Today he has no new complaints. No additional episodes  of headache Patient was accompanied by wife.  Patient  had interim CT scan done.    Review of Systems  Constitutional: Negative for appetite change, chills, fatigue, fever and unexpected weight change.  HENT:   Negative for hearing loss and voice change.   Eyes: Negative for eye problems and icterus.  Respiratory: Negative for chest tightness, cough and shortness of breath.   Cardiovascular: Negative for chest pain and leg swelling.  Gastrointestinal: Negative for abdominal distention, abdominal pain, blood in stool and nausea.  Endocrine: Negative for hot flashes.  Genitourinary: Negative for difficulty urinating, dysuria and frequency.   Musculoskeletal: Negative for arthralgias.  Skin: Negative for itching and rash.  Neurological: Negative for extremity weakness, headaches, light-headedness and numbness.  Hematological: Negative for adenopathy. Does not bruise/bleed easily.  Psychiatric/Behavioral: Negative for confusion.    MEDICAL HISTORY:  Past Medical History:  Diagnosis Date  . Arthritis    hands, left knee  . Cancer ([place])   . Diabetes mellitus without complication ([place])   . Eczema 01/08/2017  . GERD (gastroesophageal reflux disease)   . History of kidney cancer   . Hyperlipidemia LDL goal <100 11/02/2014  . Hypertension   . Kidney stone    YEAR H/O HEMATURIA  . Psoriasis   . Wears dentures    full upper, partial lower.  Has, does not wear    SURGICAL HISTORY: Past Surgical History:  Procedure Laterality Date  . COLONOSCOPY WITH PROPOFOL N/A 02/23/2017   Procedure: COLONOSCOPY WITH PROPOFOL;  Surgeon: [name], MD;  Location: [place];  Service: Endoscopy;  Laterality: N/A;  Diabetic - oral meds  . CYST EXCISION  Aug. 2016   on back  . CYSTOSCOPY W/ RETROGRADES Right 06/28/2018   Procedure: CYSTOSCOPY WITH RETROGRADE PYELOGRAM;  Surgeon: Billey Co, MD;  Location: [facility];  Service: Urology;  Laterality: Right;  . CYSTOSCOPY W/ URETERAL STENT REMOVAL Right 07/22/2018   Procedure: CYSTOSCOPY WITH  STENT REMOVAL;  Surgeon: Billey Co, MD;  Location: [facility];  Service: Urology;  Laterality: Right;  . CYSTOSCOPY WITH BIOPSY Right 06/28/2018   Procedure: CYSTOSCOPY WITH RENAL BIOPSY;  Surgeon: Billey Co, MD;  Location: [facility];  Service: Urology;  Laterality: Right;  . CYSTOSCOPY WITH URETEROSCOPY AND STENT PLACEMENT Right 06/28/2018   Procedure: CYSTOSCOPY WITH URETEROSCOPY AND STENT PLACEMENT;  Surgeon: Billey Co, MD;  Location: [facility];  Service: Urology;  Laterality: Right;  . FULGURATION OF BLADDER TUMOR N/A 06/28/2018   Procedure: CYSTOSCOPY BLADDER BIOPSY, FULGERATION OF BLADDER;  Surgeon: Billey Co, MD;  Location: [facility];  Service: Urology;  Laterality: N/A;  . KNEE SURGERY Left   . LAPAROSCOPIC NEPHRECTOMY, HAND ASSISTED Right 07/22/2018   Procedure: HAND ASSISTED LAPAROSCOPIC NEPHRECTOMY;  Surgeon: Billey Co, MD;  Location: [facility];  Service: Urology;  Laterality: Right;  . NASAL SINUS SURGERY  03/15  . POLYPECTOMY N/A 02/23/2017   Procedure: POLYPECTOMY;  Surgeon: [name], MD;  Location: [place];  Service: Endoscopy;  Laterality: N/A;  . tooth abstraction    . TRANSURETHRAL RESECTION OF BLADDER TUMOR N/A 05/12/2019   Procedure: TRANSURETHRAL RESECTION OF BLADDER TUMOR (TURBT);  Surgeon: Billey Co, MD;  Location: [facility];  Service: Urology;  Laterality: N/A;  . VARICOCELE EXCISION      SOCIAL HISTORY: Social History   Socioeconomic History  . Marital status: Married    Spouse name: Not on file  . Number of children: Not on file  .  Years of education: Not on file  . Highest education level: Not on file  Occupational History  . Not on file  Tobacco Use  . Smoking status: Former Smoker    Types: Cigarettes    Quit date: 06/15/2013    Years since quitting: 6.2  . Smokeless tobacco: Never Used  Substance and Sexual Activity  . Alcohol use: No    Alcohol/week: 0.0 standard drinks  . Drug use: No  . Sexual  activity: Yes  Other Topics Concern  . Not on file  Social History Narrative  . Not on file   Social Determinants of Health   Financial Resource Strain:   . Difficulty of Paying Living Expenses:   Food Insecurity:   . Worried About Charity fundraiser in the Last Year:   . Arboriculturist in the Last Year:   Transportation Needs:   . Film/video editor (Medical):   Marland Kitchen Lack of Transportation (Non-Medical):   Physical Activity:   . Days of Exercise per Week:   . Minutes of Exercise per Session:   Stress:   . Feeling of Stress :   Social Connections:   . Frequency of Communication with Friends and Family:   . Frequency of Social Gatherings with Friends and Family:   . Attends Religious Services:   . Active Member of Clubs or Organizations:   . Attends Archivist Meetings:   Marland Kitchen Marital Status:   Intimate Partner Violence:   . Fear of Current or Ex-Partner:   . Emotionally Abused:   Marland Kitchen Physically Abused:   . Sexually Abused:     FAMILY HISTORY: Family History  Problem Relation Age of Onset  . Heart disease Father   . Hypertension Father   . COPD Father   . Colon cancer Maternal Grandmother   . Heart attack Paternal Grandfather     ALLERGIES:  is allergic to glucotrol [glipizide].  MEDICATIONS:  Current Outpatient Medications  Medication Sig Dispense Refill  . amLODipine (NORVASC) 2.5 MG tablet Take 1 tablet (2.5 mg total) by mouth daily. 30 tablet 3  . aspirin EC 81 MG tablet Take 81 mg by mouth daily.    Marland Kitchen axitinib (INLYTA) 5 MG tablet TAKE 1 TABLET BY MOUTH TWICE A DAY (12 HOURS APART) WITH A FULL GLASS OF WATER. MAY TAKE WITH OR WITHOUT FOOD. AVOID GRAPEFRUIT PRODUCTS. 60 tablet 0  . [place]-Glucosamine-Vit D (OSTEO BI-FLEX ONE PER DAY) TABS Take 1 tablet by mouth daily.    . clobetasol cream (TEMOVATE) [id number] % Apply 1 application topically 2 (two) times daily. To affected skin; too strong for face, groin, underarms (Patient not taking: Reported on  07/17/2019) 30 g 2  . Glucose Blood (ACCU-CHEK AVIVA PLUS VI) by In Vitro route.    Marland Kitchen JARDIANCE 10 MG TABS tablet TAKE 1 TABLET BY MOUTH DAILY (Patient taking differently: Take 10 mg by mouth daily. ) 30 tablet 2  . lisinopril (PRINIVIL,ZESTRIL) 10 MG tablet TAKE 1 TABLET(10 MG) BY MOUTH DAILY (Patient taking differently: Take 10 mg by mouth daily. ) 90 tablet 1  . loperamide (IMODIUM) 2 MG capsule Take 1 capsule (2 mg total) by mouth See admin instructions. With onset of loose stool, take 4mg  followed by 2mg  every 2 hours until 12 hours have passed without loose bowel movement. Maximum: 16 mg/day (Patient not taking: Reported on 08/28/2019) 60 capsule 0  . meloxicam (MOBIC) 15 MG tablet Take 15 mg by mouth daily.     Marland Kitchen  metFORMIN (GLUCOPHAGE-XR) 500 MG 24 hr tablet Take 3 pills by mouth daily for 4-5 days, then increase to 4 pills daily (if tolerated) (Patient taking differently: Take 2,000 mg by mouth daily with breakfast. ) 360 tablet 0  . Multiple Vitamin (MULTIVITAMIN) tablet Take 1 tablet by mouth daily.    . Omega-3 Fatty Acids (FISH OIL) 1200 MG CAPS Take 1,200 mg by mouth daily.     . Omeprazole (PRILOSEC PO) Take 20 mg by mouth daily as needed (heart burn).     Marland Kitchen OZEMPIC, 0.25 OR 0.5 MG/DOSE, 2 MG/1.5ML SOPN     . pioglitazone (ACTOS) 45 MG tablet TAKE 1 TABLET(45 MG) BY MOUTH DAILY (Patient taking differently: Take 45 mg by mouth daily. ) 90 tablet 1  . prochlorperazine (COMPAZINE) 10 MG tablet Take 1 tablet (10 mg total) by mouth every 6 (six) hours as needed for nausea or vomiting. 15 tablet 0  . rosuvastatin (CRESTOR) 10 MG tablet Take 1 tablet (10 mg total) by mouth at bedtime. 90 tablet 1   No current facility-administered medications for this visit.     PHYSICAL EXAMINATION: ECOG PERFORMANCE STATUS: 0 - Asymptomatic Vitals:   09/18/19 0858  BP: (!) 143/89  Pulse: 98  Resp: 18  Temp: (!) 95.8 F (35.4 C)   Filed Weights   09/18/19 0858  Weight: 219 lb 8 oz (99.6 kg)     Physical Exam Constitutional:      General: He is not in acute distress. HENT:     Head: Normocephalic and atraumatic.  Eyes:     General: No scleral icterus.    Pupils: Pupils are equal, round, and reactive to light.  Cardiovascular:     Rate and Rhythm: Normal rate and regular rhythm.     Heart sounds: Normal heart sounds.  Pulmonary:     Effort: Pulmonary effort is normal. No respiratory distress.     Breath sounds: No wheezing.  Abdominal:     General: Bowel sounds are normal. There is no distension.     Palpations: Abdomen is soft. There is no mass.     Tenderness: There is no abdominal tenderness.  Musculoskeletal:        General: No deformity. Normal range of motion.     Cervical back: Normal range of motion and neck supple.  Skin:    General: Skin is warm and dry.     Findings: No erythema or rash.  Neurological:     Mental Status: He is alert and oriented to person, place, and time. Mental status is at baseline.     Cranial Nerves: No cranial nerve deficit.     Coordination: Coordination normal.  Psychiatric:        Mood and Affect: Mood normal.     LABORATORY DATA:  I have reviewed the data as listed Lab Results  Component Value Date   WBC 4.8 08/28/2019   HGB 15.7 08/28/2019   HCT 47.1 08/28/2019   MCV 88.5 08/28/2019   PLT 192 08/28/2019   Recent Labs    07/17/19 1748 08/07/19 0841 08/28/19 0849  NA 135 132* 136  K 4.0 4.4 4.2  CL 100 98 99  CO2 24 24 26   GLUCOSE 180* 258* 121*  BUN 22* 22* 27*  CREATININE 1.13 1.31* 1.28*  CALCIUM 9.4 9.6 9.0  GFRNONAA >60 >60 >60  GFRAA >60 >60 >60  PROT 7.8 7.1 7.1  ALBUMIN 4.5 4.3 4.1  AST 20 27 18   ALT 22 25 21  ALKPHOS 71 63 58  BILITOT 1.2 0.7 1.1   Iron/TIBC/Ferritin/ %Sat No results found for: IRON, TIBC, FERRITIN, IRONPCTSAT    RADIOGRAPHIC STUDIES: I have personally reviewed the radiological images as listed and agreed with the findings in the report.  DG Eye Foreign Body  Result  Date: 07/25/2019 CLINICAL DATA:  Metal working/exposure; clearance prior to MRI EXAM: ORBITS FOR FOREIGN BODY - 2 VIEW COMPARISON:  None. FINDINGS: Water's views with eyes deviated toward the left and toward the right obtained. No intraorbital radiopaque foreign body. Paranasal sinuses are clear. No fracture or dislocation. IMPRESSION: No evidence of metallic foreign body within the orbits. Electronically Signed   By: Lowella Grip Woodard M.D.   On: 07/25/2019 11:44   CT Head Wo Contrast  Result Date: 07/17/2019 CLINICAL DATA:  Acute headaches and history of renal cell carcinoma with metastatic disease EXAM: CT HEAD WITHOUT CONTRAST TECHNIQUE: Contiguous axial images were obtained from the base of the skull through the vertex without intravenous contrast. COMPARISON:  None. FINDINGS: Brain: No evidence of acute infarction, hemorrhage, hydrocephalus, extra-axial collection or mass lesion/mass effect. Vascular: No hyperdense vessel or unexpected calcification. Skull: Normal. Negative for fracture or focal lesion. Sinuses/Orbits: No acute finding. Other: None. IMPRESSION: Normal head CT for age Electronically Signed   By: Inez Catalina M.D.   On: 07/17/2019 21:25   CT Chest W Contrast  Result Date: 09/12/2019 CLINICAL DATA:  Clear cell renal carcinoma. Pulmonary metastasis. Bladder mass. Patient status post RIGHT nephrectomy. EXAM: CT CHEST, ABDOMEN, AND PELVIS WITH CONTRAST TECHNIQUE: Multidetector CT imaging of the chest, abdomen and pelvis was performed following the standard protocol during bolus administration of intravenous contrast. CONTRAST:  163mL OMNIPAQUE IOHEXOL 300 MG/ML  SOLN COMPARISON:  Chest CT 05/28/2019, CT abdomen pelvis 04/30/2019 FINDINGS: CT CHEST FINDINGS Cardiovascular: No significant vascular findings. Normal heart size. No pericardial effusion. Mediastinum/Nodes: No axillary supraclavicular adenopathy. No mediastinal hilar adenopathy no pericardial effusion. Esophagus normal.  Lungs/Pleura: Decrease in size of pulmonary nodules. For example: LEFT upper lobe nodule measuring 2 mm (image 72/series 3) decreased from 8 mm. Rounded nodule in the superior segment of the LEFT lower lobe previously measuring 6 mm has resolved. RIGHT middle lobe nodule measuring 3 mm (image 94/3) decreased from 10 mm. RIGHT lower lobe nodule resolved. No new pulmonary nodules. Musculoskeletal: No aggressive osseous lesion. CT ABDOMEN AND PELVIS FINDINGS Hepatobiliary: No focal hepatic lesion. No biliary ductal dilatation. Gallbladder is normal. Common bile duct is normal. Pancreas: Pancreas is normal. No ductal dilatation. No pancreatic inflammation. Spleen: Normal spleen Adrenals/urinary tract: RIGHT adrenal gland normal. Small focus of postsurgical tissue in the RIGHT nephrectomy bed (image 71/2) is unchanged and favored benign postsurgical granulation/scarring. No new nodularity. LEFT adrenal gland normal. High-density cyst in the cortex of the LEFT kidney measures 25 mm (image 34/2) is unchanged from 25 mm. This lesion previously characterized on CT 06/21/2018 as nonenhancing. Adjacent simple fluid attenuation cyst measuring 16 mm (image 76/2) is unchanged. No new lesion in the LEFT kidney. LEFT ureter normal. No enhancing lesion in the bladder. Interval resection of RIGHT bladder wall tumor. Stomach/Bowel: Stomach, small bowel, appendix, and cecum are normal. The colon and rectosigmoid colon are normal. Vascular/Lymphatic: Abdominal aorta is normal caliber with atherosclerotic calcification. There is no retroperitoneal or periportal lymphadenopathy. No pelvic lymphadenopathy. Reproductive: Prostate normal Other: No free fluid. Musculoskeletal: No aggressive osseous lesion IMPRESSION: Chest Impression: 1. Bilateral pulmonary nodules decreased significantly size. Several nodules have resolved completely. 2. No new pulmonary nodules. Abdomen /  Pelvis Impression: 1. Bosniak 1 and Bosniak 2 benign cysts of the  LEFT kidney. No interval change. 2. Stable RIGHT nephrectomy bed with mild scarring. 3. Interval resection of RIGHT bladder wall tumor. No enhancing lesion the bladder. 4. No evidence of metastatic disease. Electronically Signed   By: Suzy Bouchard M.D.   On: 09/12/2019 09:50   MR BRAIN W WO CONTRAST  Result Date: 07/25/2019 CLINICAL DATA:  Severe intermittent headaches, history of renal cancer EXAM: MRI HEAD WITHOUT AND WITH CONTRAST TECHNIQUE: Multiplanar, multiecho pulse sequences of the brain and surrounding structures were obtained without and with intravenous contrast. CONTRAST:  67mL GADAVIST GADOBUTROL 1 MMOL/ML IV SOLN COMPARISON:  None. FINDINGS: Brain: There is no acute infarction or intracranial hemorrhage. There is no intracranial mass, mass effect, or edema. There is no hydrocephalus or extra-axial fluid collection. No abnormal enhancement. Vascular: Major vessel flow voids at the skull base are preserved. Skull and upper cervical spine: Normal marrow signal is preserved. Sinuses/Orbits: Paranasal sinuses are aerated. Orbits are unremarkable. Other: Sella is unremarkable.  Mastoid air cells are clear. IMPRESSION: No mass, hemorrhage, or abnormal enhancement. Electronically Signed   By: Macy Mis M.D.   On: 07/25/2019 15:03   CT Abdomen Pelvis W Contrast  Result Date: 09/12/2019 CLINICAL DATA:  Clear cell renal carcinoma. Pulmonary metastasis. Bladder mass. Patient status post RIGHT nephrectomy. EXAM: CT CHEST, ABDOMEN, AND PELVIS WITH CONTRAST TECHNIQUE: Multidetector CT imaging of the chest, abdomen and pelvis was performed following the standard protocol during bolus administration of intravenous contrast. CONTRAST:  12mL OMNIPAQUE IOHEXOL 300 MG/ML  SOLN COMPARISON:  Chest CT 05/28/2019, CT abdomen pelvis 04/30/2019 FINDINGS: CT CHEST FINDINGS Cardiovascular: No significant vascular findings. Normal heart size. No pericardial effusion. Mediastinum/Nodes: No axillary supraclavicular  adenopathy. No mediastinal hilar adenopathy no pericardial effusion. Esophagus normal. Lungs/Pleura: Decrease in size of pulmonary nodules. For example: LEFT upper lobe nodule measuring 2 mm (image 72/series 3) decreased from 8 mm. Rounded nodule in the superior segment of the LEFT lower lobe previously measuring 6 mm has resolved. RIGHT middle lobe nodule measuring 3 mm (image 94/3) decreased from 10 mm. RIGHT lower lobe nodule resolved. No new pulmonary nodules. Musculoskeletal: No aggressive osseous lesion. CT ABDOMEN AND PELVIS FINDINGS Hepatobiliary: No focal hepatic lesion. No biliary ductal dilatation. Gallbladder is normal. Common bile duct is normal. Pancreas: Pancreas is normal. No ductal dilatation. No pancreatic inflammation. Spleen: Normal spleen Adrenals/urinary tract: RIGHT adrenal gland normal. Small focus of postsurgical tissue in the RIGHT nephrectomy bed (image 71/2) is unchanged and favored benign postsurgical granulation/scarring. No new nodularity. LEFT adrenal gland normal. High-density cyst in the cortex of the LEFT kidney measures 25 mm (image 34/2) is unchanged from 25 mm. This lesion previously characterized on CT 06/21/2018 as nonenhancing. Adjacent simple fluid attenuation cyst measuring 16 mm (image 76/2) is unchanged. No new lesion in the LEFT kidney. LEFT ureter normal. No enhancing lesion in the bladder. Interval resection of RIGHT bladder wall tumor. Stomach/Bowel: Stomach, small bowel, appendix, and cecum are normal. The colon and rectosigmoid colon are normal. Vascular/Lymphatic: Abdominal aorta is normal caliber with atherosclerotic calcification. There is no retroperitoneal or periportal lymphadenopathy. No pelvic lymphadenopathy. Reproductive: Prostate normal Other: No free fluid. Musculoskeletal: No aggressive osseous lesion IMPRESSION: Chest Impression: 1. Bilateral pulmonary nodules decreased significantly size. Several nodules have resolved completely. 2. No new pulmonary  nodules. Abdomen / Pelvis Impression: 1. Bosniak 1 and Bosniak 2 benign cysts of the LEFT kidney. No interval change. 2. Stable RIGHT nephrectomy  bed with mild scarring. 3. Interval resection of RIGHT bladder wall tumor. No enhancing lesion the bladder. 4. No evidence of metastatic disease. Electronically Signed   By: Suzy Bouchard M.D.   On: 09/12/2019 09:50      ASSESSMENT & PLAN:  1. Clear cell carcinoma of right kidney ([place])   2. Renal cell carcinoma of right kidney metastatic to other site ([place])   3. Encounter for antineoplastic immunotherapy   4. Transaminitis   5. 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 Currently on axitinib 5 mg twice daily.  And Keytruda every 3 weeks.   Labs reviewed and discussed with patient.)  CT chest abdomen pelvis was reviewed and discussed with patient.  Patient has had excellent partial response to the treatments. Hold axitinib and Keytruda.  Transaminitis, Patient's AST and ALT, about 5 pounds of upper normal limit. Can be possible hepatic toxicities from either axitinib or Keytruda or both. Recommend patient to hold off axitinib and Keytruda for now. Patient to repeat blood work on 09/22/2019 to trend liver function testing. Patient also provided history of frequent use of Tylenol due to bilateral knee pain. Advised patient to avoid Tylenol use.-If transaminitis recovers to less than grade 1, may consider rechallenge with either axitinib or Keytruda sequentially.  One at a time.  Bilateral knee osteoarthritis Recommend patient to establish care with orthopedic surgeon. Avoid Tylenol for now.  Patient has CKD and has to avoid over limit use of NSAIDs. I recommend patient to try Voltaren 1% gel to see if it helps with his symptoms.  # Hyperglycemia due to uncontrolled DM.  A1c has improved to 7.7.  Continue follow-up with primary care provider for  glycemic control.  We spent sufficient time to discuss many aspect of care, questions were answered to patient's satisfaction. All questions were answered. The patient knows to call the clinic with any problems questions or concerns.  Return of visit: 1 week.  Earlie Server, MD, PhD Hematology Oncology [facility] Pager- [phone] 09/18/2019

## 2019-09-18 NOTE — Progress Notes (Signed)
Labs reviewed with MD and treatment team. Per MD hold [facility] today. Pt and treatment team updated. Pt informed of new schedule and appointments prior to discharge. All questions answered at this time.   Atara Paterson [employer]

## 2019-09-18 NOTE — Addendum Note (Signed)
Addended by: Earlie Server on: 09/18/2019 05:21 PM   Modules accepted: Level of Service

## 2019-09-19 ENCOUNTER — Telehealth: Payer: Self-pay

## 2019-09-19 DIAGNOSIS — C641 Malignant neoplasm of right kidney, except renal pelvis: Secondary | ICD-10-CM

## 2019-09-19 NOTE — Telephone Encounter (Signed)
-----   Message from Earlie Server, MD sent at 09/18/2019  5:14 PM EDT ----- Please arrange him to do lab work on 5/10. LFT only. Ordered.

## 2019-09-19 NOTE — Telephone Encounter (Signed)
Done..Marland KitchenMarland KitchenMarland KitchenMarland Kitchen   Pt is aware of his scheduled lab appt sched on 09/22/19 @ 1045

## 2019-09-22 ENCOUNTER — Inpatient Hospital Stay: Payer: BC Managed Care – PPO

## 2019-09-22 ENCOUNTER — Other Ambulatory Visit: Payer: Self-pay

## 2019-09-22 DIAGNOSIS — R7401 Elevation of levels of liver transaminase levels: Secondary | ICD-10-CM

## 2019-09-22 DIAGNOSIS — M170 Bilateral primary osteoarthritis of knee: Secondary | ICD-10-CM

## 2019-09-22 DIAGNOSIS — C641 Malignant neoplasm of right kidney, except renal pelvis: Secondary | ICD-10-CM

## 2019-09-22 DIAGNOSIS — C649 Malignant neoplasm of unspecified kidney, except renal pelvis: Secondary | ICD-10-CM | POA: Diagnosis not present

## 2019-09-22 DIAGNOSIS — Z5112 Encounter for antineoplastic immunotherapy: Secondary | ICD-10-CM

## 2019-09-22 LAB — HEPATIC FUNCTION PANEL
ALT: 360 U/L — ABNORMAL HIGH (ref 0–44)
AST: 180 U/L — ABNORMAL HIGH (ref 15–41)
Albumin: 3.7 g/dL (ref 3.5–5.0)
Alkaline Phosphatase: 57 U/L (ref 38–126)
Bilirubin, Direct: 0.2 mg/dL (ref 0.0–0.2)
Indirect Bilirubin: 0.7 mg/dL (ref 0.3–0.9)
Total Bilirubin: 0.9 mg/dL (ref 0.3–1.2)
Total Protein: 6.5 g/dL (ref 6.5–8.1)

## 2019-09-25 ENCOUNTER — Other Ambulatory Visit: Payer: Self-pay

## 2019-09-25 ENCOUNTER — Encounter: Payer: Self-pay | Admitting: Oncology

## 2019-09-25 ENCOUNTER — Inpatient Hospital Stay (HOSPITAL_BASED_OUTPATIENT_CLINIC_OR_DEPARTMENT_OTHER): Payer: BC Managed Care – PPO | Admitting: Oncology

## 2019-09-25 ENCOUNTER — Inpatient Hospital Stay: Payer: BC Managed Care – PPO

## 2019-09-25 VITALS — BP 131/79 | HR 93 | Temp 95.4°F | Resp 18 | Wt 221.9 lb

## 2019-09-25 DIAGNOSIS — Z5112 Encounter for antineoplastic immunotherapy: Secondary | ICD-10-CM

## 2019-09-25 DIAGNOSIS — C641 Malignant neoplasm of right kidney, except renal pelvis: Secondary | ICD-10-CM

## 2019-09-25 DIAGNOSIS — R7401 Elevation of levels of liver transaminase levels: Secondary | ICD-10-CM

## 2019-09-25 DIAGNOSIS — R7989 Other specified abnormal findings of blood chemistry: Secondary | ICD-10-CM

## 2019-09-25 DIAGNOSIS — Z5111 Encounter for antineoplastic chemotherapy: Secondary | ICD-10-CM

## 2019-09-25 DIAGNOSIS — C649 Malignant neoplasm of unspecified kidney, except renal pelvis: Secondary | ICD-10-CM | POA: Diagnosis not present

## 2019-09-25 DIAGNOSIS — R945 Abnormal results of liver function studies: Secondary | ICD-10-CM

## 2019-09-25 LAB — HEPATITIS B CORE ANTIBODY, TOTAL: Hep B Core Total Ab: NONREACTIVE

## 2019-09-25 LAB — IRON AND TIBC
Iron: 118 ug/dL (ref 45–182)
Saturation Ratios: 26 % (ref 17.9–39.5)
TIBC: 454 ug/dL — ABNORMAL HIGH (ref 250–450)
UIBC: 336 ug/dL

## 2019-09-25 LAB — HIV ANTIBODY (ROUTINE TESTING W REFLEX): HIV Screen 4th Generation wRfx: NONREACTIVE

## 2019-09-25 LAB — COMPREHENSIVE METABOLIC PANEL
ALT: 466 U/L — ABNORMAL HIGH (ref 0–44)
AST: 216 U/L — ABNORMAL HIGH (ref 15–41)
Albumin: 4.1 g/dL (ref 3.5–5.0)
Alkaline Phosphatase: 62 U/L (ref 38–126)
Anion gap: 10 (ref 5–15)
BUN: 24 mg/dL — ABNORMAL HIGH (ref 6–20)
CO2: 26 mmol/L (ref 22–32)
Calcium: 9.6 mg/dL (ref 8.9–10.3)
Chloride: 99 mmol/L (ref 98–111)
Creatinine, Ser: 1.15 mg/dL (ref 0.61–1.24)
GFR calc Af Amer: >60 mL/min (ref >60)
GFR calc non Af Amer: >60 mL/min (ref >60)
Glucose, Bld: 130 mg/dL — ABNORMAL HIGH (ref 70–99)
Potassium: 4.3 mmol/L (ref 3.5–5.1)
Sodium: 135 mmol/L (ref 135–145)
Total Bilirubin: 1.1 mg/dL (ref 0.3–1.2)
Total Protein: 7.5 g/dL (ref 6.5–8.1)

## 2019-09-25 LAB — CBC WITH DIFFERENTIAL/PLATELET
Abs Immature Granulocytes: 0.01 10*3/uL (ref 0.00–0.07)
Basophils Absolute: 0 10*3/uL (ref 0.0–0.1)
Basophils Relative: 1 %
Eosinophils Absolute: 0.4 10*3/uL (ref 0.0–0.5)
Eosinophils Relative: 6 %
HCT: 45.8 % (ref 39.0–52.0)
Hemoglobin: 15.4 g/dL (ref 13.0–17.0)
Immature Granulocytes: 0 %
Lymphocytes Relative: 30 %
Lymphs Abs: 1.9 10*3/uL (ref 0.7–4.0)
MCH: 30.1 pg (ref 26.0–34.0)
MCHC: 33.6 g/dL (ref 30.0–36.0)
MCV: 89.6 fL (ref 80.0–100.0)
Monocytes Absolute: 0.6 10*3/uL (ref 0.1–1.0)
Monocytes Relative: 10 %
Neutro Abs: 3.3 10*3/uL (ref 1.7–7.7)
Neutrophils Relative %: 53 %
Platelets: 209 10*3/uL (ref 150–400)
RBC: 5.11 MIL/uL (ref 4.22–5.81)
RDW: 13.3 % (ref 11.5–15.5)
WBC: 6.2 10*3/uL (ref 4.0–10.5)
nRBC: 0 % (ref 0.0–0.2)

## 2019-09-25 LAB — GAMMA GT: GGT: 30 U/L (ref 7–50)

## 2019-09-25 LAB — PROTIME-INR
INR: 1.1 (ref 0.8–1.2)
Prothrombin Time: 13.5 seconds (ref 11.4–15.2)

## 2019-09-25 LAB — HEPATITIS A ANTIBODY, TOTAL: hep A Total Ab: NONREACTIVE

## 2019-09-25 LAB — FERRITIN: Ferritin: 236 ng/mL (ref 24–336)

## 2019-09-25 LAB — CK: Total CK: 69 U/L (ref 49–397)

## 2019-09-25 LAB — APTT: aPTT: 28 seconds (ref 24–36)

## 2019-09-25 LAB — HEPATITIS C ANTIBODY: HCV Ab: NONREACTIVE

## 2019-09-25 LAB — ACETAMINOPHEN LEVEL: Acetaminophen (Tylenol), Serum: <10 ug/mL — ABNORMAL LOW (ref 10–30)

## 2019-09-25 LAB — HEPATITIS B SURFACE ANTIBODY,QUALITATIVE: Hep B S Ab: NONREACTIVE

## 2019-09-25 LAB — HEPATITIS B SURFACE ANTIGEN: Hepatitis B Surface Ag: NONREACTIVE

## 2019-09-25 LAB — PROTEIN, URINE, RANDOM: Total Protein, Urine: 13 mg/dL

## 2019-09-25 NOTE — Progress Notes (Signed)
Hematology/Oncology follow up note [facility] Telephone:([phone]) [phone] Fax:([phone]) [phone]   Patient Care Team: [name], NP as PCP - General [name], [name], MD as PCP - Family Medicine (Family Medicine) [name], MD as Consulting Physician (General Surgery) [name], [name], MD as Attending Physician (Family Medicine) [name], [name], MD as Consulting Physician (Dermatology)  REFERRING PROVIDER: [name], NP  CHIEF COMPLAINTS/REASON FOR VISIT:  Evaluation of kidney cancer  HISTORY OF PRESENTING ILLNESS:   Matthew Woodard is a  54 y.o.  male with PMH listed below was seen in consultation at the request of  [name], NP  for evaluation of kidney cancer Patient's cancer history dated back to February 2020 when he developed gross hematuria with small clots and right-sided flank pain.  Patient had a CT urogram done which showed right 4 cm central enhancing renal mass with invasion into the collecting system.  X-ray of chest was performed to see complete staging which showed no concerning findings. 06/28/2018 patient underwent a cystoscopy, bladder biopsy and a right retrograde pyelogram with intraoperative interpretation, right diagnostic ureteroscopy, right renal pelvis biopsy, right ureteral stent placement.  biopsy showed atypical cell clusters, with extensive crush artifact, nondiagnostic.   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  Surgical margins negative for tumor.pT3a,Nx Patient has been on surveillance after surgery. 10/28/2018 CT abdomen pelvis with contrast showed minimal fluid and or postoperative changes within the right renal fossa.  No evidence of metastatic disease or recurrence.  Patient has left kidney lesion previously characterized as  nonenhancing cyst by multi phasic contrast-enhanced CT. Patient reports an episode of gross hematuria in early December 2020, patient is due for 68-month surveillance CT scan. 04/30/2019 CT abdomen pelvis with contrast showed high attenuation mass at the right uretero vesicle junction, with extension into the bladder.  Bilateral pulmonary nodules, most indicated for metastatic disease.   Patient underwent cystoscopy and transurethral resection of irregular bladder tumor 3 cm.  Mass appeared to emanate from the right ureteral orifice.  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 Patient was referred to me for further work-up and management.  #Staging chest CT with contrast showed numerous bilateral pulmonary nodules.  Compatible with metastatic disease. Bone scan showed no osseous metastatic disease.   #History of tobacco abuse, former smoker.  Quit in 2015.  He denies any hemoptysis, chest pain, shortness of breath today. Patient reports that hematuria has completely resolved.  Denies any pain today. He is accompanied by his wife.  #History of cutaneous psoriasis, mostly on his right hand, currently on weekly methotrexate 12.5 mg with good symptom control. #NGS: [facility] PD L1 TPS 1%  # patient was referred to ENT for evaluation of headache and sinus pressure.  He had a ENT evaluation and feels that patient does not have sinusitis.  [name] recommended MRI brain to rule out PRES syndrome.  Axitinib has been held MRI brain was done and was negative.   INTERVAL HISTORY Matthew Woodard is a 54 y.o. male who has above history reviewed by me today presents for follow up visit for management of metastatic RCC,  Problems and complaints are listed below: Due to transaminitis patient's axitinib has been held since 1 week ago.  Last Beryle Flock was about 4 weeks ago. Patient has also stopped taking Tylenol. Patient  denies any abdominal pain, nausea,  vomiting. He denies any new medication except ozempia which was started in February.  Amlodipine was also started around the same time .Patient was accompanied by wife.      Review of Systems  Constitutional: Negative for appetite change, chills, fatigue, fever and unexpected weight change.  HENT:   Negative for hearing loss and voice change.   Eyes: Negative for eye problems and icterus.  Respiratory: Negative for chest tightness, cough and shortness of breath.   Cardiovascular: Negative for chest pain and leg swelling.  Gastrointestinal: Negative for abdominal distention, abdominal pain, blood in stool and nausea.  Endocrine: Negative for hot flashes.  Genitourinary: Negative for difficulty urinating, dysuria and frequency.   Musculoskeletal: Negative for arthralgias.  Skin: Negative for itching and rash.  Neurological: Negative for extremity weakness, headaches, light-headedness and numbness.  Hematological: Negative for adenopathy. Does not bruise/bleed easily.  Psychiatric/Behavioral: Negative for confusion.    MEDICAL HISTORY:  Past Medical History:  Diagnosis Date  . Arthritis    hands, left knee  . Cancer ([place])   . Diabetes mellitus without complication ([place])   . Eczema 01/08/2017  . GERD (gastroesophageal reflux disease)   . History of kidney cancer   . Hyperlipidemia LDL goal <100 11/02/2014  . Hypertension   . Kidney stone    YEAR H/O HEMATURIA  . Psoriasis   . Wears dentures    full upper, partial lower.  Has, does not wear    SURGICAL HISTORY: Past Surgical History:  Procedure Laterality Date  . COLONOSCOPY WITH PROPOFOL N/A 02/23/2017   Procedure: COLONOSCOPY WITH PROPOFOL;  Surgeon: [name], MD;  Location: [place];  Service: Endoscopy;  Laterality: N/A;  Diabetic - oral meds  . CYST EXCISION  Aug. 2016   on back  . CYSTOSCOPY W/ RETROGRADES Right 06/28/2018   Procedure: CYSTOSCOPY WITH RETROGRADE PYELOGRAM;  Surgeon: Billey Co, MD;   Location: [facility];  Service: Urology;  Laterality: Right;  . CYSTOSCOPY W/ URETERAL STENT REMOVAL Right 07/22/2018   Procedure: CYSTOSCOPY WITH STENT REMOVAL;  Surgeon: Billey Co, MD;  Location: [facility];  Service: Urology;  Laterality: Right;  . CYSTOSCOPY WITH BIOPSY Right 06/28/2018   Procedure: CYSTOSCOPY WITH RENAL BIOPSY;  Surgeon: Billey Co, MD;  Location: [facility];  Service: Urology;  Laterality: Right;  . CYSTOSCOPY WITH URETEROSCOPY AND STENT PLACEMENT Right 06/28/2018   Procedure: CYSTOSCOPY WITH URETEROSCOPY AND STENT PLACEMENT;  Surgeon: Billey Co, MD;  Location: [facility];  Service: Urology;  Laterality: Right;  . FULGURATION OF BLADDER TUMOR N/A 06/28/2018   Procedure: CYSTOSCOPY BLADDER BIOPSY, FULGERATION OF BLADDER;  Surgeon: Billey Co, MD;  Location: [facility];  Service: Urology;  Laterality: N/A;  . KNEE SURGERY Left   . LAPAROSCOPIC NEPHRECTOMY, HAND ASSISTED Right 07/22/2018   Procedure: HAND ASSISTED LAPAROSCOPIC NEPHRECTOMY;  Surgeon: Billey Co, MD;  Location: [facility];  Service: Urology;  Laterality: Right;  . NASAL SINUS SURGERY  03/15  . POLYPECTOMY N/A 02/23/2017   Procedure: POLYPECTOMY;  Surgeon: [name], MD;  Location: [place];  Service: Endoscopy;  Laterality: N/A;  . tooth abstraction    . TRANSURETHRAL RESECTION OF BLADDER TUMOR N/A 05/12/2019   Procedure: TRANSURETHRAL RESECTION OF BLADDER TUMOR (TURBT);  Surgeon: Billey Co, MD;  Location: [facility];  Service: Urology;  Laterality: N/A;  . VARICOCELE EXCISION      SOCIAL HISTORY: Social History   Socioeconomic History  . Marital  status: Married    Spouse name: Not on file  . Number of children: Not on file  . Years of education: Not on file  . Highest education level: Not on file  Occupational History  . Not on file  Tobacco Use  . Smoking status: Former Smoker    Types: Cigarettes    Quit date: 06/15/2013    Years since quitting: 6.2  . Smokeless  tobacco: Never Used  Substance and Sexual Activity  . Alcohol use: No    Alcohol/week: 0.0 standard drinks  . Drug use: No  . Sexual activity: Yes  Other Topics Concern  . Not on file  Social History Narrative  . Not on file   Social Determinants of Health   Financial Resource Strain:   . Difficulty of Paying Living Expenses:   Food Insecurity:   . Worried About Charity fundraiser in the Last Year:   . Arboriculturist in the Last Year:   Transportation Needs:   . Film/video editor (Medical):   Marland Kitchen Lack of Transportation (Non-Medical):   Physical Activity:   . Days of Exercise per Week:   . Minutes of Exercise per Session:   Stress:   . Feeling of Stress :   Social Connections:   . Frequency of Communication with Friends and Family:   . Frequency of Social Gatherings with Friends and Family:   . Attends Religious Services:   . Active Member of Clubs or Organizations:   . Attends Archivist Meetings:   Marland Kitchen Marital Status:   Intimate Partner Violence:   . Fear of Current or Ex-Partner:   . Emotionally Abused:   Marland Kitchen Physically Abused:   . Sexually Abused:     FAMILY HISTORY: Family History  Problem Relation Age of Onset  . Heart disease Father   . Hypertension Father   . COPD Father   . Colon cancer Maternal Grandmother   . Heart attack Paternal Grandfather     ALLERGIES:  is allergic to glucotrol [glipizide].  MEDICATIONS:  Current Outpatient Medications  Medication Sig Dispense Refill  . amLODipine (NORVASC) 2.5 MG tablet Take 1 tablet (2.5 mg total) by mouth daily. 30 tablet 3  . aspirin EC 81 MG tablet Take 81 mg by mouth daily.    . [place]-Glucosamine-Vit D (OSTEO BI-FLEX ONE PER DAY) TABS Take 1 tablet by mouth daily.    . diclofenac Sodium (VOLTAREN) 1 % GEL Apply 2 g topically 4 (four) times daily. 100 g 1  . Glucose Blood (ACCU-CHEK AVIVA PLUS VI) by In Vitro route.    Marland Kitchen JARDIANCE 10 MG TABS tablet TAKE 1 TABLET BY MOUTH DAILY (Patient  taking differently: Take 10 mg by mouth daily. ) 30 tablet 2  . lisinopril (PRINIVIL,ZESTRIL) 10 MG tablet TAKE 1 TABLET(10 MG) BY MOUTH DAILY (Patient taking differently: Take 10 mg by mouth daily. ) 90 tablet 1  . loperamide (IMODIUM) 2 MG capsule Take 1 capsule (2 mg total) by mouth See admin instructions. With onset of loose stool, take 4mg  followed by 2mg  every 2 hours until 12 hours have passed without loose bowel movement. Maximum: 16 mg/day 60 capsule 0  . metFORMIN (GLUCOPHAGE-XR) 500 MG 24 hr tablet Take 3 pills by mouth daily for 4-5 days, then increase to 4 pills daily (if tolerated) (Patient taking differently: Take 2,000 mg by mouth daily with breakfast. ) 360 tablet 0  . Multiple Vitamin (MULTIVITAMIN) tablet Take 1 tablet by mouth daily.    Marland Kitchen  Omega-3 Fatty Acids (FISH OIL) 1200 MG CAPS Take 1,200 mg by mouth daily.     . Omeprazole (PRILOSEC PO) Take 20 mg by mouth daily as needed (heart burn).     Marland Kitchen OZEMPIC, 0.25 OR 0.5 MG/DOSE, 2 MG/1.5ML SOPN once a week.     . pioglitazone (ACTOS) 45 MG tablet TAKE 1 TABLET(45 MG) BY MOUTH DAILY (Patient taking differently: Take 45 mg by mouth daily. ) 90 tablet 1  . prochlorperazine (COMPAZINE) 10 MG tablet Take 1 tablet (10 mg total) by mouth every 6 (six) hours as needed for nausea or vomiting. 15 tablet 0  . rosuvastatin (CRESTOR) 10 MG tablet Take 1 tablet (10 mg total) by mouth at bedtime. 90 tablet 1  . clobetasol cream (TEMOVATE) [id number] % Apply 1 application topically 2 (two) times daily. To affected skin; too strong for face, groin, underarms (Patient not taking: Reported on 07/17/2019) 30 g 2  . INLYTA 5 MG tablet TAKE ONE TABLET BY MOUTH TWICE A DAY (12 HOURS APART) WITH A FULL GLASS OF WATER. MAY TAKE WITH OR WITHOUT FOOD. AVOID GRAPEFRUIT PRODUCTS. (Patient not taking: Reported on 09/25/2019) 60 tablet 0   No current facility-administered medications for this visit.     PHYSICAL EXAMINATION: ECOG PERFORMANCE STATUS: 0 -  Asymptomatic Vitals:   09/25/19 0840  BP: 131/79  Pulse: 93  Resp: 18  Temp: (!) 95.4 F (35.2 C)   Filed Weights   09/25/19 0840  Weight: 221 lb 14.4 oz (100.7 kg)    Physical Exam Constitutional:      General: He is not in acute distress. HENT:     Head: Normocephalic and atraumatic.  Eyes:     General: No scleral icterus.    Pupils: Pupils are equal, round, and reactive to light.  Cardiovascular:     Rate and Rhythm: Normal rate and regular rhythm.     Heart sounds: Normal heart sounds.  Pulmonary:     Effort: Pulmonary effort is normal. No respiratory distress.     Breath sounds: No wheezing.  Abdominal:     General: Bowel sounds are normal. There is no distension.     Palpations: Abdomen is soft. There is no mass.     Tenderness: There is no abdominal tenderness.  Musculoskeletal:        General: No deformity. Normal range of motion.     Cervical back: Normal range of motion and neck supple.  Skin:    General: Skin is warm and dry.     Findings: No erythema or rash.  Neurological:     Mental Status: He is alert and oriented to person, place, and time. Mental status is at baseline.     Cranial Nerves: No cranial nerve deficit.     Coordination: Coordination normal.  Psychiatric:        Mood and Affect: Mood normal.     LABORATORY DATA:  I have reviewed the data as listed Lab Results  Component Value Date   WBC 6.2 09/25/2019   HGB 15.4 09/25/2019   HCT 45.8 09/25/2019   MCV 89.6 09/25/2019   PLT 209 09/25/2019   Recent Labs    08/28/19 0849 08/28/19 0849 09/18/19 0841 09/22/19 1046 09/25/19 0812  NA 136  --  136  --  135  K 4.2  --  4.2  --  4.3  CL 99  --  99  --  99  CO2 26  --  25  --  26  GLUCOSE 121*  --  101*  --  130*  BUN 27*  --  24*  --  24*  CREATININE 1.28*  --  1.27*  --  1.15  CALCIUM 9.0  --  9.5  --  9.6  GFRNONAA >60  --  >60  --  >60  GFRAA >60  --  >60  --  >60  PROT 7.1   < > 7.5 6.5 7.5  ALBUMIN 4.1   < > 4.4 3.7 4.1   AST 18   < > 192* 180* 216*  ALT 21   < > 297* 360* 466*  ALKPHOS 58   < > 69 57 62  BILITOT 1.1   < > 1.2 0.9 1.1  BILIDIR  --   --   --  0.2  --   IBILI  --   --   --  0.7  --    < > = values in this interval not displayed.   Iron/TIBC/Ferritin/ %Sat    Component Value Date/Time   IRON 118 09/25/2019 1115   TIBC 454 (H) 09/25/2019 1115   FERRITIN 236 09/25/2019 1115   IRONPCTSAT 26 09/25/2019 1115      RADIOGRAPHIC STUDIES: I have personally reviewed the radiological images as listed and agreed with the findings in the report.  DG Eye Foreign Body  Result Date: 07/25/2019 CLINICAL DATA:  Metal working/exposure; clearance prior to MRI EXAM: ORBITS FOR FOREIGN BODY - 2 VIEW COMPARISON:  None. FINDINGS: Water's views with eyes deviated toward the left and toward the right obtained. No intraorbital radiopaque foreign body. Paranasal sinuses are clear. No fracture or dislocation. IMPRESSION: No evidence of metallic foreign body within the orbits. Electronically Signed   By: Lowella Grip Woodard M.D.   On: 07/25/2019 11:44   CT Head Wo Contrast  Result Date: 07/17/2019 CLINICAL DATA:  Acute headaches and history of renal cell carcinoma with metastatic disease EXAM: CT HEAD WITHOUT CONTRAST TECHNIQUE: Contiguous axial images were obtained from the base of the skull through the vertex without intravenous contrast. COMPARISON:  None. FINDINGS: Brain: No evidence of acute infarction, hemorrhage, hydrocephalus, extra-axial collection or mass lesion/mass effect. Vascular: No hyperdense vessel or unexpected calcification. Skull: Normal. Negative for fracture or focal lesion. Sinuses/Orbits: No acute finding. Other: None. IMPRESSION: Normal head CT for age Electronically Signed   By: Inez Catalina M.D.   On: 07/17/2019 21:25   CT Chest W Contrast  Result Date: 09/12/2019 CLINICAL DATA:  Clear cell renal carcinoma. Pulmonary metastasis. Bladder mass. Patient status post RIGHT nephrectomy. EXAM: CT  CHEST, ABDOMEN, AND PELVIS WITH CONTRAST TECHNIQUE: Multidetector CT imaging of the chest, abdomen and pelvis was performed following the standard protocol during bolus administration of intravenous contrast. CONTRAST:  156mL OMNIPAQUE IOHEXOL 300 MG/ML  SOLN COMPARISON:  Chest CT 05/28/2019, CT abdomen pelvis 04/30/2019 FINDINGS: CT CHEST FINDINGS Cardiovascular: No significant vascular findings. Normal heart size. No pericardial effusion. Mediastinum/Nodes: No axillary supraclavicular adenopathy. No mediastinal hilar adenopathy no pericardial effusion. Esophagus normal. Lungs/Pleura: Decrease in size of pulmonary nodules. For example: LEFT upper lobe nodule measuring 2 mm (image 72/series 3) decreased from 8 mm. Rounded nodule in the superior segment of the LEFT lower lobe previously measuring 6 mm has resolved. RIGHT middle lobe nodule measuring 3 mm (image 94/3) decreased from 10 mm. RIGHT lower lobe nodule resolved. No new pulmonary nodules. Musculoskeletal: No aggressive osseous lesion. CT ABDOMEN AND PELVIS FINDINGS Hepatobiliary: No focal hepatic lesion. No biliary ductal dilatation. Gallbladder is  normal. Common bile duct is normal. Pancreas: Pancreas is normal. No ductal dilatation. No pancreatic inflammation. Spleen: Normal spleen Adrenals/urinary tract: RIGHT adrenal gland normal. Small focus of postsurgical tissue in the RIGHT nephrectomy bed (image 71/2) is unchanged and favored benign postsurgical granulation/scarring. No new nodularity. LEFT adrenal gland normal. High-density cyst in the cortex of the LEFT kidney measures 25 mm (image 34/2) is unchanged from 25 mm. This lesion previously characterized on CT 06/21/2018 as nonenhancing. Adjacent simple fluid attenuation cyst measuring 16 mm (image 76/2) is unchanged. No new lesion in the LEFT kidney. LEFT ureter normal. No enhancing lesion in the bladder. Interval resection of RIGHT bladder wall tumor. Stomach/Bowel: Stomach, small bowel, appendix, and  cecum are normal. The colon and rectosigmoid colon are normal. Vascular/Lymphatic: Abdominal aorta is normal caliber with atherosclerotic calcification. There is no retroperitoneal or periportal lymphadenopathy. No pelvic lymphadenopathy. Reproductive: Prostate normal Other: No free fluid. Musculoskeletal: No aggressive osseous lesion IMPRESSION: Chest Impression: 1. Bilateral pulmonary nodules decreased significantly size. Several nodules have resolved completely. 2. No new pulmonary nodules. Abdomen / Pelvis Impression: 1. Bosniak 1 and Bosniak 2 benign cysts of the LEFT kidney. No interval change. 2. Stable RIGHT nephrectomy bed with mild scarring. 3. Interval resection of RIGHT bladder wall tumor. No enhancing lesion the bladder. 4. No evidence of metastatic disease. Electronically Signed   By: Suzy Bouchard M.D.   On: 09/12/2019 09:50   MR BRAIN W WO CONTRAST  Result Date: 07/25/2019 CLINICAL DATA:  Severe intermittent headaches, history of renal cancer EXAM: MRI HEAD WITHOUT AND WITH CONTRAST TECHNIQUE: Multiplanar, multiecho pulse sequences of the brain and surrounding structures were obtained without and with intravenous contrast. CONTRAST:  78mL GADAVIST GADOBUTROL 1 MMOL/ML IV SOLN COMPARISON:  None. FINDINGS: Brain: There is no acute infarction or intracranial hemorrhage. There is no intracranial mass, mass effect, or edema. There is no hydrocephalus or extra-axial fluid collection. No abnormal enhancement. Vascular: Major vessel flow voids at the skull base are preserved. Skull and upper cervical spine: Normal marrow signal is preserved. Sinuses/Orbits: Paranasal sinuses are aerated. Orbits are unremarkable. Other: Sella is unremarkable.  Mastoid air cells are clear. IMPRESSION: No mass, hemorrhage, or abnormal enhancement. Electronically Signed   By: Macy Mis M.D.   On: 07/25/2019 15:03   CT Abdomen Pelvis W Contrast  Result Date: 09/12/2019 CLINICAL DATA:  Clear cell renal carcinoma.  Pulmonary metastasis. Bladder mass. Patient status post RIGHT nephrectomy. EXAM: CT CHEST, ABDOMEN, AND PELVIS WITH CONTRAST TECHNIQUE: Multidetector CT imaging of the chest, abdomen and pelvis was performed following the standard protocol during bolus administration of intravenous contrast. CONTRAST:  144mL OMNIPAQUE IOHEXOL 300 MG/ML  SOLN COMPARISON:  Chest CT 05/28/2019, CT abdomen pelvis 04/30/2019 FINDINGS: CT CHEST FINDINGS Cardiovascular: No significant vascular findings. Normal heart size. No pericardial effusion. Mediastinum/Nodes: No axillary supraclavicular adenopathy. No mediastinal hilar adenopathy no pericardial effusion. Esophagus normal. Lungs/Pleura: Decrease in size of pulmonary nodules. For example: LEFT upper lobe nodule measuring 2 mm (image 72/series 3) decreased from 8 mm. Rounded nodule in the superior segment of the LEFT lower lobe previously measuring 6 mm has resolved. RIGHT middle lobe nodule measuring 3 mm (image 94/3) decreased from 10 mm. RIGHT lower lobe nodule resolved. No new pulmonary nodules. Musculoskeletal: No aggressive osseous lesion. CT ABDOMEN AND PELVIS FINDINGS Hepatobiliary: No focal hepatic lesion. No biliary ductal dilatation. Gallbladder is normal. Common bile duct is normal. Pancreas: Pancreas is normal. No ductal dilatation. No pancreatic inflammation. Spleen: Normal spleen Adrenals/urinary tract: RIGHT  adrenal gland normal. Small focus of postsurgical tissue in the RIGHT nephrectomy bed (image 71/2) is unchanged and favored benign postsurgical granulation/scarring. No new nodularity. LEFT adrenal gland normal. High-density cyst in the cortex of the LEFT kidney measures 25 mm (image 34/2) is unchanged from 25 mm. This lesion previously characterized on CT 06/21/2018 as nonenhancing. Adjacent simple fluid attenuation cyst measuring 16 mm (image 76/2) is unchanged. No new lesion in the LEFT kidney. LEFT ureter normal. No enhancing lesion in the bladder. Interval  resection of RIGHT bladder wall tumor. Stomach/Bowel: Stomach, small bowel, appendix, and cecum are normal. The colon and rectosigmoid colon are normal. Vascular/Lymphatic: Abdominal aorta is normal caliber with atherosclerotic calcification. There is no retroperitoneal or periportal lymphadenopathy. No pelvic lymphadenopathy. Reproductive: Prostate normal Other: No free fluid. Musculoskeletal: No aggressive osseous lesion IMPRESSION: Chest Impression: 1. Bilateral pulmonary nodules decreased significantly size. Several nodules have resolved completely. 2. No new pulmonary nodules. Abdomen / Pelvis Impression: 1. Bosniak 1 and Bosniak 2 benign cysts of the LEFT kidney. No interval change. 2. Stable RIGHT nephrectomy bed with mild scarring. 3. Interval resection of RIGHT bladder wall tumor. No enhancing lesion the bladder. 4. No evidence of metastatic disease. Electronically Signed   By: Suzy Bouchard M.D.   On: 09/12/2019 09:50      ASSESSMENT & PLAN:  1. Transaminitis   2. 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 Labs were reviewed and discussed with patient.  Continue to hold axitinib and embolism.  Transaminitis, Patient's AST and ALT, are persistently elevated and trending up.  Bilirubin has been normal. 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  Hold off treatment for now.  Patient may need to be started on steroids. Urgent referral was sent to gastroenterology and I discussed [name] via secure chat. Per Dr. Georgeann Oppenheim recommendation, check iron, TIBC ferritin, GGT, CK, hepatitis panel, immunoglobulins, alpha 1 antitrypsin, celiac disease, antimicrosomal antibody liver/kidney, ANA, mitochondrial/smooth muscle antibody, HIV, Patient has a virtual visit with gastroenterology tomorrow 3 PM.  We spent sufficient time to discuss many aspect of care, questions were  answered to patient's satisfaction. All questions were answered. The patient knows to call the clinic with any problems questions or concerns.  Return of visit: To be determined  Earlie Server, MD, PhD Hematology Oncology [facility] Pager- [phone] 09/25/2019

## 2019-09-25 NOTE — Progress Notes (Signed)
Patient here for follow up. Pt is concerned about what is going on with his liver.

## 2019-09-26 ENCOUNTER — Telehealth (INDEPENDENT_AMBULATORY_CARE_PROVIDER_SITE_OTHER): Payer: BC Managed Care – PPO | Admitting: Gastroenterology

## 2019-09-26 DIAGNOSIS — R945 Abnormal results of liver function studies: Secondary | ICD-10-CM

## 2019-09-26 DIAGNOSIS — R7989 Other specified abnormal findings of blood chemistry: Secondary | ICD-10-CM

## 2019-09-26 LAB — CERULOPLASMIN: Ceruloplasmin: 23.7 mg/dL (ref 16.0–31.0)

## 2019-09-26 LAB — MITOCHONDRIAL/SMOOTH MUSCLE AB PNL
F-Actin IgG: 6 Units (ref 0–19)
Mitochondrial M2 Ab, IgG: <20 Units (ref 0.0–20.0)

## 2019-09-26 LAB — HEPATITIS B E ANTIGEN: Hep B E Ag: NEGATIVE

## 2019-09-26 LAB — HEPATITIS B E ANTIBODY: Hep B E Ab: NEGATIVE

## 2019-09-26 LAB — ANA: Anti Nuclear Antibody (ANA): NEGATIVE

## 2019-09-26 LAB — ALPHA-1-ANTITRYPSIN: A-1 Antitrypsin, Ser: 94 mg/dL — ABNORMAL LOW (ref 101–187)

## 2019-09-26 LAB — ANTI-MICROSOMAL ANTIBODY LIVER / KIDNEY: LKM1 Ab: 0.8 Units (ref 0.0–20.0)

## 2019-09-26 NOTE — Progress Notes (Signed)
Matthew Woodard  [address]  [place]  [place], [place] [address]  Main: [phone]  Fax: [phone]   Gastroenterology Consultation  Referring Provider:   Dr Tasia Catchings  Primary Care Physician:  [name], NP Reason for Consultation:    Abnormal LFT's         HPI:   Virtual Visit via video  Note  I connected with patient on 09/26/19 at  3:00 PM EDT by video  and verified that I am speaking with the correct person using two identifiers.   I discussed the limitations, risks, security and privacy concerns of performing an evaluation and management service by video and the availability of in person appointments. I also discussed with the patient that there may be a patient responsible charge related to this service. The patient expressed understanding and agreed to proceed.  Location of the patient: Home Location of provider: Home Participating persons: Patient and provider only   History of Present Illness: Abnormal LFT's   Matthew Woodard is a 54 y.o. y/o male referred for consultation & management  by Dr. Tasia Catchings.  He is seen for renal cancer.  Symptoms began in February 2020 with hematuria.  CT urogram showed 4 cm centrally enhancing renal mass with invasion into the collecting system.  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  History of psoriasis.  On weekly methotrexate.  Has been on axitinib stopped a week back, was on it since 05/2019.  Has been on Ozempic.Started it in 06/2019- still taking it   09/12/2019 CT scan of the abdomen and pelvis with contrast demonstrates bilateral pulmonary nodules decreased significantly in size renal cysts stable right nephrectomy bed interval resection of right bladder wall tumor hepatobiliary system appears normal.   Hepatic Function Latest Ref Rng & Units 09/25/2019 09/22/2019 09/18/2019  Total Protein 6.5 - 8.1 g/dL 7.5 6.5 7.5  Albumin 3.5 - 5.0 g/dL 4.1 3.7 4.4  AST 15 - 41 U/L 216(H) 180(H) 192(H)  ALT 0 - 44 U/L 466(H)  360(H) 297(H)  Alk Phosphatase 38 - 126 U/L 62 57 69  Total Bilirubin 0.3 - 1.2 mg/dL 1.1 0.9 1.2  Bilirubin, Direct 0.0 - 0.2 mg/dL - 0.2 -   09/25/2019: Tylenol levels normal, INR 1.1, alpha-1 antitrypsin 94 borderline low total CK 69 normal, GT normal iron studies normal, ceruloplasmin normal, hepatitis B core total antibody, HIV, hepatitis A total antibody, hepatitis B surface antibody, hepatitis C virus antibody ,, hepatitis B E antigen,Hbsag-negative.  .  Alcohol use noone  Drug use none  Over the counter herbal supplements baby asprin and multivitamin . 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 Arthritis    hands, left knee  . Cancer ([place])   . Diabetes mellitus without complication ([place])   . Eczema 01/08/2017  . GERD (gastroesophageal reflux disease)   . History of kidney cancer   . Hyperlipidemia LDL goal <100 11/02/2014  . Hypertension   . Kidney stone    YEAR H/O HEMATURIA  . Psoriasis   . Wears dentures    full upper, partial lower.  Has, does not wear    Past Surgical History:  Procedure  Laterality Date  . COLONOSCOPY WITH PROPOFOL N/A 02/23/2017   Procedure: COLONOSCOPY WITH PROPOFOL;  Surgeon: [name], MD;  Location: [facility];  Service: Endoscopy;  Laterality: N/A;  Diabetic - oral meds  . CYST EXCISION  Aug. 2016   on back  . CYSTOSCOPY W/ RETROGRADES Right 06/28/2018   Procedure: CYSTOSCOPY WITH RETROGRADE PYELOGRAM;  Surgeon: Billey Co, MD;  Location: [facility];  Service: Urology;  Laterality: Right;  . CYSTOSCOPY W/ URETERAL STENT REMOVAL Right 07/22/2018   Procedure: CYSTOSCOPY WITH STENT REMOVAL;  Surgeon: Billey Co, MD;  Location: [facility];  Service: Urology;   Laterality: Right;  . CYSTOSCOPY WITH BIOPSY Right 06/28/2018   Procedure: CYSTOSCOPY WITH RENAL BIOPSY;  Surgeon: Billey Co, MD;  Location: [facility];  Service: Urology;  Laterality: Right;  . CYSTOSCOPY WITH URETEROSCOPY AND STENT PLACEMENT Right 06/28/2018   Procedure: CYSTOSCOPY WITH URETEROSCOPY AND STENT PLACEMENT;  Surgeon: Billey Co, MD;  Location: [facility];  Service: Urology;  Laterality: Right;  . FULGURATION OF BLADDER TUMOR N/A 06/28/2018   Procedure: CYSTOSCOPY BLADDER BIOPSY, FULGERATION OF BLADDER;  Surgeon: Billey Co, MD;  Location: [facility];  Service: Urology;  Laterality: N/A;  . KNEE SURGERY Left   . LAPAROSCOPIC NEPHRECTOMY, HAND ASSISTED Right 07/22/2018   Procedure: HAND ASSISTED LAPAROSCOPIC NEPHRECTOMY;  Surgeon: Billey Co, MD;  Location: [facility];  Service: Urology;  Laterality: Right;  . NASAL SINUS SURGERY  03/15  . POLYPECTOMY N/A 02/23/2017   Procedure: POLYPECTOMY;  Surgeon: [name], MD;  Location: [facility];  Service: Endoscopy;  Laterality: N/A;  . tooth abstraction    . TRANSURETHRAL RESECTION OF BLADDER TUMOR N/A 05/12/2019   Procedure: TRANSURETHRAL RESECTION OF BLADDER TUMOR (TURBT);  Surgeon: Billey Co, MD;  Location: [facility];  Service: Urology;  Laterality: N/A;  . VARICOCELE EXCISION      Prior to Admission medications   Medication Sig Start Date End Date Taking? Authorizing Provider  amLODipine (NORVASC) 2.5 MG tablet Take 1 tablet (2.5 mg total) by mouth daily. 08/11/19   Earlie Server, MD  aspirin EC 81 MG tablet Take 81 mg by mouth daily.    [provider]  Boswellia-Glucosamine-Vit D (OSTEO BI-FLEX ONE PER DAY) TABS Take 1 tablet by mouth daily.    [provider]  clobetasol cream (TEMOVATE) 0.93 % Apply 1 application topically 2 (two) times daily. To affected skin; too strong for face, groin, underarms Patient not taking: Reported on 07/17/2019 01/08/17   Arnetha Courser, MD  diclofenac  Sodium (VOLTAREN) 1 % GEL Apply 2 g topically 4 (four) times daily. 09/18/19   Earlie Server, MD  Glucose Blood (ACCU-CHEK AVIVA PLUS VI) by In Vitro route.    [provider]  INLYTA 5 MG tablet TAKE ONE TABLET BY MOUTH TWICE A DAY (12 HOURS APART) WITH A FULL GLASS OF WATER. MAY TAKE WITH OR WITHOUT FOOD. AVOID GRAPEFRUIT PRODUCTS. Patient not taking: Reported on 09/25/2019 09/18/19   Earlie Server, MD  JARDIANCE 10 MG TABS tablet TAKE 1 TABLET BY MOUTH DAILY Patient taking differently: Take 10 mg by mouth daily.  12/05/18   Hubbard Hartshorn, FNP  lisinopril (PRINIVIL,ZESTRIL) 10 MG tablet TAKE 1 TABLET(10 MG) BY MOUTH DAILY Patient taking differently: Take 10 mg by mouth daily.  08/01/18   Arnetha Courser, MD  loperamide (IMODIUM) 2 MG capsule Take 1 capsule (2 mg total) by mouth See admin instructions. With onset of loose stool,  take 32m followed by 237mevery 2 hours until 12 hours have passed without loose bowel movement. Maximum: 16 mg/day 06/05/19   YuEarlie ServerMD  metFORMIN (GLUCOPHAGE-XR) 500 MG 24 hr tablet Take 3 pills by mouth daily for 4-5 days, then increase to 4 pills daily (if tolerated) Patient taking differently: Take 2,000 mg by mouth daily with breakfast.  08/15/18   [name], MeSatira AnisMD  Multiple Vitamin (MULTIVITAMIN) tablet Take 1 tablet by mouth daily.    [provider]  Omega-3 Fatty Acids (FISH OIL) 1200 MG CAPS Take 1,200 mg by mouth daily.     [provider]  Omeprazole (PRILOSEC PO) Take 20 mg by mouth daily as needed (heart burn).     [provider]  OZEMPIC, 0.25 OR 0.5 MG/DOSE, 2 MG/1.5ML SOPN once a week.  06/13/19   [provider]  pioglitazone (ACTOS) 45 MG tablet TAKE 1 TABLET(45 MG) BY MOUTH DAILY Patient taking differently: Take 45 mg by mouth daily.  08/01/18   LaArnetha CourserMD  prochlorperazine (COMPAZINE) 10 MG tablet Take 1 tablet (10 mg total) by mouth every 6 (six) hours as needed for nausea or vomiting. 07/17/19   JeBlake Divine MD  rosuvastatin (CRESTOR) 10 MG tablet Take 1 tablet (10 mg total) by mouth at bedtime. 09/24/18   Poulose, ElBethel BornNP    Family History  Problem Relation Age of Onset  . Heart disease Father   . Hypertension Father   . COPD Father   . Colon cancer Maternal Grandmother   . Heart attack Paternal Grandfather      Social History   Tobacco Use  . Smoking status: Former Smoker    Types: Cigarettes    Quit date: 06/15/2013    Years since quitting: 6.2  . Smokeless tobacco: Never Used  Substance Use Topics  . Alcohol use: No    Alcohol/week: 0.0 standard drinks  . Drug use: No    Allergies as of 09/26/2019 - Review Complete 09/25/2019  Allergen Reaction Noted  . Glucotrol [glipizide] Other (See Comments) 10/22/2014    Review of Systems:    All systems reviewed and negative except where noted in HPI. General Appearance:    Alert, cooperative, no distress, appears stated age  Head:    Normocephalic, without obvious abnormality, atraumatic  Eyes:    PERRL, conjunctiva/corneas clear,  Ears:    Grossly normal hearing    Neurologic:   Grossly appears normal     Observations/Objective:  Labs: CBC    Component Value Date/Time   WBC 6.2 09/25/2019 0812   RBC 5.11 09/25/2019 0812   HGB 15.4 09/25/2019 0812   HCT 45.8 09/25/2019 0812   PLT 209 09/25/2019 0812   MCV 89.6 09/25/2019 0812   MCH 30.1 09/25/2019 0812   MCHC 33.6 09/25/2019 0812   RDW 13.3 09/25/2019 0812   LYMPHSABS 1.9 09/25/2019 0812   MONOABS 0.6 09/25/2019 0812   EOSABS 0.4 09/25/2019 0812   BASOSABS 0.0 09/25/2019 0812   CMP     Component Value Date/Time   NA 135 09/25/2019 0812   NA 136 03/02/2015 0909   NA 132 (L) 07/31/2013 1302   K 4.3 09/25/2019 0812   K 4.2 07/31/2013 1302   CL 99 09/25/2019 0812   CL 100 07/31/2013 1302   CO2 26 09/25/2019 0812   CO2 26 07/31/2013 1302   GLUCOSE 130 (H) 09/25/2019 0812   GLUCOSE 246 (H) 07/31/2013 1302   BUN 24 (H)  09/25/2019 0812   BUN 18  03/02/2015 0909   BUN 18 07/31/2013 1302   CREATININE 1.15 09/25/2019 0812   CREATININE 1.04 08/05/2018 0841   CALCIUM 9.6 09/25/2019 0812   CALCIUM 8.9 07/31/2013 1302   PROT 7.5 09/25/2019 0812   PROT 6.6 11/02/2014 0825   PROT 7.1 02/01/2013 0826   ALBUMIN 4.1 09/25/2019 0812   ALBUMIN 4.4 11/02/2014 0825   ALBUMIN 3.7 02/01/2013 0826   AST 216 (H) 09/25/2019 0812   AST 16 02/01/2013 0826   ALT 466 (H) 09/25/2019 0812   ALT 25 02/01/2013 0826   ALKPHOS 62 09/25/2019 0812   ALKPHOS 96 02/01/2013 0826   BILITOT 1.1 09/25/2019 0812   BILITOT 0.4 11/02/2014 0825   BILITOT 0.4 02/01/2013 0826   GFRNONAA >60 09/25/2019 0812   GFRNONAA 82 08/05/2018 0841   GFRAA >60 09/25/2019 0812   GFRAA 95 08/05/2018 0841    Imaging Studies: CT Chest W Contrast  Result Date: 09/12/2019 CLINICAL DATA:  Clear cell renal carcinoma. Pulmonary metastasis. Bladder mass. Patient status post RIGHT nephrectomy. EXAM: CT CHEST, ABDOMEN, AND PELVIS WITH CONTRAST TECHNIQUE: Multidetector CT imaging of the chest, abdomen and pelvis was performed following the standard protocol during bolus administration of intravenous contrast. CONTRAST:  117m OMNIPAQUE IOHEXOL 300 MG/ML  SOLN COMPARISON:  Chest CT 05/28/2019, CT abdomen pelvis 04/30/2019 FINDINGS: CT CHEST FINDINGS Cardiovascular: No significant vascular findings. Normal heart size. No pericardial effusion. Mediastinum/Nodes: No axillary supraclavicular adenopathy. No mediastinal hilar adenopathy no pericardial effusion. Esophagus normal. Lungs/Pleura: Decrease in size of pulmonary nodules. For example: LEFT upper lobe nodule measuring 2 mm (image 72/series 3) decreased from 8 mm. Rounded nodule in the superior segment of the LEFT lower lobe previously measuring 6 mm has resolved. RIGHT middle lobe nodule measuring 3 mm (image 94/3) decreased from 10 mm. RIGHT lower lobe nodule resolved. No new pulmonary nodules. Musculoskeletal: No aggressive osseous lesion. CT  ABDOMEN AND PELVIS FINDINGS Hepatobiliary: No focal hepatic lesion. No biliary ductal dilatation. Gallbladder is normal. Common bile duct is normal. Pancreas: Pancreas is normal. No ductal dilatation. No pancreatic inflammation. Spleen: Normal spleen Adrenals/urinary tract: RIGHT adrenal gland normal. Small focus of postsurgical tissue in the RIGHT nephrectomy bed (image 71/2) is unchanged and favored benign postsurgical granulation/scarring. No new nodularity. LEFT adrenal gland normal. High-density cyst in the cortex of the LEFT kidney measures 25 mm (image 34/2) is unchanged from 25 mm. This lesion previously characterized on CT 06/21/2018 as nonenhancing. Adjacent simple fluid attenuation cyst measuring 16 mm (image 76/2) is unchanged. No new lesion in the LEFT kidney. LEFT ureter normal. No enhancing lesion in the bladder. Interval resection of RIGHT bladder wall tumor. Stomach/Bowel: Stomach, small bowel, appendix, and cecum are normal. The colon and rectosigmoid colon are normal. Vascular/Lymphatic: Abdominal aorta is normal caliber with atherosclerotic calcification. There is no retroperitoneal or periportal lymphadenopathy. No pelvic lymphadenopathy. Reproductive: Prostate normal Other: No free fluid. Musculoskeletal: No aggressive osseous lesion IMPRESSION: Chest Impression: 1. Bilateral pulmonary nodules decreased significantly size. Several nodules have resolved completely. 2. No new pulmonary nodules. Abdomen / Pelvis Impression: 1. Bosniak 1 and Bosniak 2 benign cysts of the LEFT kidney. No interval change. 2. Stable RIGHT nephrectomy bed with mild scarring. 3. Interval resection of RIGHT bladder wall tumor. No enhancing lesion the bladder. 4. No evidence of metastatic disease. Electronically Signed   By: SSuzy BouchardM.D.   On: 09/12/2019 09:50   CT Abdomen Pelvis W Contrast  Result Date: 09/12/2019 CLINICAL DATA:  Clear cell renal carcinoma. Pulmonary metastasis. Bladder mass. Patient status  post RIGHT nephrectomy. EXAM: CT CHEST, ABDOMEN, AND PELVIS WITH CONTRAST TECHNIQUE: Multidetector CT imaging of the chest, abdomen and pelvis was performed following the standard protocol during bolus administration of intravenous contrast. CONTRAST:  166m OMNIPAQUE IOHEXOL 300 MG/ML  SOLN COMPARISON:  Chest CT 05/28/2019, CT abdomen pelvis 04/30/2019 FINDINGS: CT CHEST FINDINGS Cardiovascular: No significant vascular findings. Normal heart size. No pericardial effusion. Mediastinum/Nodes: No axillary supraclavicular adenopathy. No mediastinal hilar adenopathy no pericardial effusion. Esophagus normal. Lungs/Pleura: Decrease in size of pulmonary nodules. For example: LEFT upper lobe nodule measuring 2 mm (image 72/series 3) decreased from 8 mm. Rounded nodule in the superior segment of the LEFT lower lobe previously measuring 6 mm has resolved. RIGHT middle lobe nodule measuring 3 mm (image 94/3) decreased from 10 mm. RIGHT lower lobe nodule resolved. No new pulmonary nodules. Musculoskeletal: No aggressive osseous lesion. CT ABDOMEN AND PELVIS FINDINGS Hepatobiliary: No focal hepatic lesion. No biliary ductal dilatation. Gallbladder is normal. Common bile duct is normal. Pancreas: Pancreas is normal. No ductal dilatation. No pancreatic inflammation. Spleen: Normal spleen Adrenals/urinary tract: RIGHT adrenal gland normal. Small focus of postsurgical tissue in the RIGHT nephrectomy bed (image 71/2) is unchanged and favored benign postsurgical granulation/scarring. No new nodularity. LEFT adrenal gland normal. High-density cyst in the cortex of the LEFT kidney measures 25 mm (image 34/2) is unchanged from 25 mm. This lesion previously characterized on CT 06/21/2018 as nonenhancing. Adjacent simple fluid attenuation cyst measuring 16 mm (image 76/2) is unchanged. No new lesion in the LEFT kidney. LEFT ureter normal. No enhancing lesion in the bladder. Interval resection of RIGHT bladder wall tumor. Stomach/Bowel:  Stomach, small bowel, appendix, and cecum are normal. The colon and rectosigmoid colon are normal. Vascular/Lymphatic: Abdominal aorta is normal caliber with atherosclerotic calcification. There is no retroperitoneal or periportal lymphadenopathy. No pelvic lymphadenopathy. Reproductive: Prostate normal Other: No free fluid. Musculoskeletal: No aggressive osseous lesion IMPRESSION: Chest Impression: 1. Bilateral pulmonary nodules decreased significantly size. Several nodules have resolved completely. 2. No new pulmonary nodules. Abdomen / Pelvis Impression: 1. Bosniak 1 and Bosniak 2 benign cysts of the LEFT kidney. No interval change. 2. Stable RIGHT nephrectomy bed with mild scarring. 3. Interval resection of RIGHT bladder wall tumor. No enhancing lesion the bladder. 4. No evidence of metastatic disease. Electronically Signed   By: SSuzy BouchardM.D.   On: 09/12/2019 09:50    Assessment and Plan:   Matthew Woodard is a 54y.o. y/o male has been referred for  abnormal LFT's . On treatment for RIBBwith axitinib that has been discontinued. 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 Check RUQ USG and doppler 2. Telephone call in 7-10 days to discuss results 3. Since GGT is normal - likely no active liver inflammation .In large clinical trials of axitinib, elevations in serum aminotransferase levels were common, occurring in up to 25% of patients. Values greater than 5 times the upper limit of normal (ULN), however, were uncommon, occurring in 1% to 2% of recipients. Furthermore, no instances of clinically apparent liver injury from axitinib were reported in prelicensure studies or during the more wide scale use since its approval. Serum aminotransferase elevations above 5 times the upper limit of normal (if confirmed) should lead to dose reduction or temporary cessation.  4. Since licensure, there have been no published case reports of hepatotoxicity due to semaglutide and the product  label does  not list liver injury as an adverse event. Thus, liver injury due to semaglutide must be rare, if it occurs at all. 5. If liver work up is negative then will closely monitor LFT's , if does not decline then will need liver bx.  6. I have spoken with Dr Tasia Catchings about the patient. Multiple labs were ordered by my office yesterday after discussion to rule out autoimmune hepatitis and viral hepatitis that are in process.     I discussed the assessment and treatment plan with the patient. The patient was provided an opportunity to ask questions and all were answered. The patient agreed with the plan and demonstrated an understanding of the instructions.   The patient was advised to call back or seek an in-person evaluation if the symptoms worsen or if the condition fails to improve as anticipated.  I provided 25  minutes of face-to-face time during this encounter.   Dr Matthew Bellows MD,MRCP [facility]) Gastroenterology/Hepatology Pager: [phone]   Speech recognition software was used to dictate the above note.

## 2019-09-27 LAB — IMMUNOGLOBULINS A/E/G/M, SERUM
IgA: 146 mg/dL (ref 90–386)
IgE (Immunoglobulin E), Serum: 732 IU/mL — ABNORMAL HIGH (ref 6–495)
IgG (Immunoglobin G), Serum: 715 mg/dL (ref 603–1613)
IgM (Immunoglobulin M), Srm: 31 mg/dL (ref 20–172)

## 2019-10-01 ENCOUNTER — Telehealth: Payer: Self-pay

## 2019-10-01 ENCOUNTER — Other Ambulatory Visit: Payer: Self-pay

## 2019-10-01 DIAGNOSIS — R7401 Elevation of levels of liver transaminase levels: Secondary | ICD-10-CM

## 2019-10-01 NOTE — Telephone Encounter (Signed)
Contacted pt and notifed him to continue to hold [place]. Pt accepts lab appt for Friday at 9:15.

## 2019-10-03 ENCOUNTER — Ambulatory Visit
Admission: RE | Admit: 2019-10-03 | Discharge: 2019-10-03 | Disposition: A | Discharge status: Home / Self Care | Payer: BC Managed Care – PPO | Source: Ambulatory Visit | Attending: Gastroenterology | Admitting: Gastroenterology

## 2019-10-03 ENCOUNTER — Inpatient Hospital Stay: Payer: BC Managed Care – PPO

## 2019-10-03 ENCOUNTER — Other Ambulatory Visit: Payer: Self-pay

## 2019-10-03 DIAGNOSIS — R945 Abnormal results of liver function studies: Secondary | ICD-10-CM

## 2019-10-03 DIAGNOSIS — C641 Malignant neoplasm of right kidney, except renal pelvis: Secondary | ICD-10-CM

## 2019-10-03 DIAGNOSIS — C649 Malignant neoplasm of unspecified kidney, except renal pelvis: Secondary | ICD-10-CM | POA: Diagnosis not present

## 2019-10-03 DIAGNOSIS — R7989 Other specified abnormal findings of blood chemistry: Secondary | ICD-10-CM

## 2019-10-03 DIAGNOSIS — R7401 Elevation of levels of liver transaminase levels: Secondary | ICD-10-CM

## 2019-10-03 LAB — CBC WITH DIFFERENTIAL/PLATELET
Abs Immature Granulocytes: 0.02 10*3/uL (ref 0.00–0.07)
Basophils Absolute: 0 10*3/uL (ref 0.0–0.1)
Basophils Relative: 0 %
Eosinophils Absolute: 0.1 10*3/uL (ref 0.0–0.5)
Eosinophils Relative: 2 %
HCT: 41.2 % (ref 39.0–52.0)
Hemoglobin: 13.8 g/dL (ref 13.0–17.0)
Immature Granulocytes: 0 %
Lymphocytes Relative: 27 %
Lymphs Abs: 1.9 10*3/uL (ref 0.7–4.0)
MCH: 30.1 pg (ref 26.0–34.0)
MCHC: 33.5 g/dL (ref 30.0–36.0)
MCV: 89.8 fL (ref 80.0–100.0)
Monocytes Absolute: 0.8 10*3/uL (ref 0.1–1.0)
Monocytes Relative: 11 %
Neutro Abs: 4.3 10*3/uL (ref 1.7–7.7)
Neutrophils Relative %: 60 %
Platelets: 229 10*3/uL (ref 150–400)
RBC: 4.59 MIL/uL (ref 4.22–5.81)
RDW: 13.7 % (ref 11.5–15.5)
WBC: 7.1 10*3/uL (ref 4.0–10.5)
nRBC: 0 % (ref 0.0–0.2)

## 2019-10-03 LAB — COMPREHENSIVE METABOLIC PANEL
ALT: 480 U/L — ABNORMAL HIGH (ref 0–44)
AST: 217 U/L — ABNORMAL HIGH (ref 15–41)
Albumin: 4 g/dL (ref 3.5–5.0)
Alkaline Phosphatase: 60 U/L (ref 38–126)
Anion gap: 7 (ref 5–15)
BUN: 32 mg/dL — ABNORMAL HIGH (ref 6–20)
CO2: 25 mmol/L (ref 22–32)
Calcium: 9.1 mg/dL (ref 8.9–10.3)
Chloride: 105 mmol/L (ref 98–111)
Creatinine, Ser: 1.16 mg/dL (ref 0.61–1.24)
GFR calc Af Amer: >60 mL/min (ref >60)
GFR calc non Af Amer: >60 mL/min (ref >60)
Glucose, Bld: 107 mg/dL — ABNORMAL HIGH (ref 70–99)
Potassium: 4.4 mmol/L (ref 3.5–5.1)
Sodium: 137 mmol/L (ref 135–145)
Total Bilirubin: 0.8 mg/dL (ref 0.3–1.2)
Total Protein: 6.9 g/dL (ref 6.5–8.1)

## 2019-10-06 ENCOUNTER — Encounter: Payer: Self-pay | Admitting: Oncology

## 2019-10-06 ENCOUNTER — Other Ambulatory Visit: Payer: Self-pay

## 2019-10-06 ENCOUNTER — Encounter: Payer: Self-pay | Admitting: Gastroenterology

## 2019-10-06 DIAGNOSIS — R945 Abnormal results of liver function studies: Secondary | ICD-10-CM

## 2019-10-06 DIAGNOSIS — R7989 Other specified abnormal findings of blood chemistry: Secondary | ICD-10-CM

## 2019-10-08 LAB — IGE: IgE (Immunoglobulin E), Serum: 709 IU/mL — ABNORMAL HIGH (ref 6–495)

## 2019-10-09 ENCOUNTER — Other Ambulatory Visit: Payer: BC Managed Care – PPO

## 2019-10-09 ENCOUNTER — Ambulatory Visit: Payer: BC Managed Care – PPO | Admitting: Oncology

## 2019-10-09 ENCOUNTER — Ambulatory Visit: Payer: BC Managed Care – PPO

## 2019-10-22 LAB — HEPATIC FUNCTION PANEL
ALT: 311 IU/L — ABNORMAL HIGH (ref 0–44)
AST: 133 IU/L — ABNORMAL HIGH (ref 0–40)
Albumin: 4.3 g/dL (ref 3.8–4.9)
Alkaline Phosphatase: 69 IU/L (ref 48–121)
Bilirubin Total: 0.6 mg/dL (ref 0.0–1.2)
Bilirubin, Direct: 0.18 mg/dL (ref 0.00–0.40)
Total Protein: 6.5 g/dL (ref 6.0–8.5)

## 2019-10-22 LAB — GAMMA GT: GGT: 82 IU/L — ABNORMAL HIGH (ref 0–65)

## 2019-10-22 NOTE — Progress Notes (Signed)
[place]   Inform patient that the liver tests are improving.  Recheck in 4 weeks  C/c Dr Levora Dredge

## 2019-10-23 ENCOUNTER — Telehealth: Payer: Self-pay

## 2019-10-23 NOTE — Telephone Encounter (Signed)
-----   Message from Jonathon Bellows, MD sent at 10/22/2019  8:17 AM EDT ----- Matthew Woodard   Inform patient that the liver tests are improving.  Recheck in 4 weeks  C/c Dr Levora Dredge

## 2019-10-29 ENCOUNTER — Inpatient Hospital Stay: Payer: BC Managed Care – PPO | Attending: Oncology | Admitting: Oncology

## 2019-10-29 ENCOUNTER — Inpatient Hospital Stay: Payer: BC Managed Care – PPO

## 2019-10-29 ENCOUNTER — Encounter: Payer: Self-pay | Admitting: Oncology

## 2019-10-29 ENCOUNTER — Other Ambulatory Visit: Payer: Self-pay

## 2019-10-29 VITALS — BP 118/79 | HR 98 | Temp 96.4°F | Resp 18 | Wt 220.2 lb

## 2019-10-29 DIAGNOSIS — Z7984 Long term (current) use of oral hypoglycemic drugs: Secondary | ICD-10-CM | POA: Diagnosis not present

## 2019-10-29 DIAGNOSIS — E785 Hyperlipidemia, unspecified: Secondary | ICD-10-CM | POA: Insufficient documentation

## 2019-10-29 DIAGNOSIS — R7401 Elevation of levels of liver transaminase levels: Secondary | ICD-10-CM | POA: Diagnosis not present

## 2019-10-29 DIAGNOSIS — Z905 Acquired absence of kidney: Secondary | ICD-10-CM | POA: Diagnosis not present

## 2019-10-29 DIAGNOSIS — I10 Essential (primary) hypertension: Secondary | ICD-10-CM | POA: Diagnosis not present

## 2019-10-29 DIAGNOSIS — C641 Malignant neoplasm of right kidney, except renal pelvis: Secondary | ICD-10-CM

## 2019-10-29 DIAGNOSIS — E119 Type 2 diabetes mellitus without complications: Secondary | ICD-10-CM | POA: Insufficient documentation

## 2019-10-29 DIAGNOSIS — K219 Gastro-esophageal reflux disease without esophagitis: Secondary | ICD-10-CM | POA: Diagnosis not present

## 2019-10-29 DIAGNOSIS — C649 Malignant neoplasm of unspecified kidney, except renal pelvis: Secondary | ICD-10-CM | POA: Insufficient documentation

## 2019-10-29 DIAGNOSIS — Z87891 Personal history of nicotine dependence: Secondary | ICD-10-CM | POA: Insufficient documentation

## 2019-10-29 DIAGNOSIS — R519 Headache, unspecified: Secondary | ICD-10-CM | POA: Insufficient documentation

## 2019-10-29 DIAGNOSIS — Z7982 Long term (current) use of aspirin: Secondary | ICD-10-CM | POA: Insufficient documentation

## 2019-10-29 DIAGNOSIS — R110 Nausea: Secondary | ICD-10-CM | POA: Insufficient documentation

## 2019-10-29 DIAGNOSIS — Z79899 Other long term (current) drug therapy: Secondary | ICD-10-CM | POA: Insufficient documentation

## 2019-10-29 LAB — HEPATIC FUNCTION PANEL
ALT: 166 U/L — ABNORMAL HIGH (ref 0–44)
AST: 80 U/L — ABNORMAL HIGH (ref 15–41)
Albumin: 4 g/dL (ref 3.5–5.0)
Alkaline Phosphatase: 55 U/L (ref 38–126)
Bilirubin, Direct: 0.2 mg/dL (ref 0.0–0.2)
Indirect Bilirubin: 0.7 mg/dL (ref 0.3–0.9)
Total Bilirubin: 0.9 mg/dL (ref 0.3–1.2)
Total Protein: 7 g/dL (ref 6.5–8.1)

## 2019-10-29 NOTE — Progress Notes (Signed)
Patient denies new problems/concerns today.   °

## 2019-10-29 NOTE — Progress Notes (Signed)
Hematology/Oncology follow up note [name] Telephone:([phone] Fax:(336) [phone]   Patient Care Team: Matthew Lamb, NP as PCP - General [name], [name], MD as PCP - Family Medicine (Family Medicine) Matthew Lye, MD as Consulting Physician (General Surgery) [name], [name], MD as Attending Physician (Family Medicine) [name], [name], MD as Consulting Physician (Dermatology)  REFERRING PROVIDER: [name], NP  CHIEF COMPLAINTS/REASON FOR VISIT:  Evaluation of kidney cancer  HISTORY OF PRESENTING ILLNESS:   Matthew Woodard is a  54 y.o.  male with PMH listed below was seen in consultation at the request of  Matthew Lamb, NP  for evaluation of kidney cancer Patient's cancer history dated back to February 2020 when he developed gross hematuria with small clots and right-sided flank pain.  Patient had a CT urogram done which showed right 4 cm central enhancing renal mass with invasion into the collecting system.  X-ray of chest was performed to see complete staging which showed no concerning findings. 06/28/2018 patient underwent a cystoscopy, bladder biopsy and a right retrograde pyelogram with intraoperative interpretation, right diagnostic ureteroscopy, right renal pelvis biopsy, right ureteral stent placement.  biopsy showed atypical cell clusters, with extensive crush artifact, nondiagnostic.   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  Surgical margins negative for tumor.pT3a,Nx Patient has been on surveillance after surgery. 10/28/2018 CT abdomen pelvis with contrast showed minimal fluid and or postoperative changes within the right renal fossa.  No evidence of metastatic disease or recurrence.  Patient has left kidney lesion previously characterized as  nonenhancing cyst by multi phasic contrast-enhanced CT. Patient reports an episode of gross hematuria in early December 2020, patient is due for 38-month surveillance CT scan. 04/30/2019 CT abdomen pelvis with contrast showed high attenuation mass at the right uretero vesicle junction, with extension into the bladder.  Bilateral pulmonary nodules, most indicated for metastatic disease.   Patient underwent cystoscopy and transurethral resection of irregular bladder tumor 3 cm.  Mass appeared to emanate from the right ureteral orifice.  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 Patient was referred to me for further work-up and management.  #Staging chest CT with contrast showed numerous bilateral pulmonary nodules.  Compatible with metastatic disease. Bone scan showed no osseous metastatic disease.   #History of tobacco abuse, former smoker.  Quit in 2015.  He denies any hemoptysis, chest pain, shortness of breath today. Patient reports that hematuria has completely resolved.  Denies any pain today. He is accompanied by his wife.  #History of cutaneous psoriasis, mostly on his right hand, currently on weekly methotrexate 12.5 mg with good symptom control. #NGS: [facility] PD L1 TPS 1%  # patient was referred to ENT for evaluation of headache and sinus pressure.  He had a ENT evaluation and feels that patient does not have sinusitis.  Matthew Woodard recommended MRI brain to rule out PRES syndrome.  Axitinib has been held MRI brain was done and was negative.   INTERVAL HISTORY Matthew Woodard is a 54 y.o. male who has above history reviewed by me today presents for follow up visit for management of metastatic RCC,  Problems and complaints are listed below: Patient has been off axitinib and Keytruda since the beginning of May due to transaminitis. Patient was seen and evaluated by gastroenterology Dr. Vicente Woodard. Work-up was negative.  Ultrasound liver vascular  Doppler is negative. Recommend observation. 10/21/2019, transaminitis trended down. Patient reports nausea occasionally and he takes antiemetics with improve of his symptoms. His blood pressure is 118/79. Otherwise he has no new complaints.    Review of Systems  Constitutional: Negative for appetite change, chills, fatigue, fever and unexpected weight change.  HENT:   Negative for hearing loss and voice change.   Eyes: Negative for eye problems and icterus.  Respiratory: Negative for chest tightness, cough and shortness of breath.   Cardiovascular: Negative for chest pain and leg swelling.  Gastrointestinal: Negative for abdominal distention, abdominal pain, blood in stool and nausea.  Endocrine: Negative for hot flashes.  Genitourinary: Negative for difficulty urinating, dysuria and frequency.   Musculoskeletal: Negative for arthralgias.  Skin: Negative for itching and rash.  Neurological: Negative for extremity weakness, headaches, light-headedness and numbness.  Hematological: Negative for adenopathy. Does not bruise/bleed easily.  Psychiatric/Behavioral: Negative for confusion.    MEDICAL HISTORY:  Past Medical History:  Diagnosis Date  . Arthritis    hands, left knee  . Cancer ([place])   . Diabetes mellitus without complication ([place])   . Eczema 01/08/2017  . GERD (gastroesophageal reflux disease)   . History of kidney cancer   . Hyperlipidemia LDL goal <100 11/02/2014  . Hypertension   . Kidney stone    YEAR H/O HEMATURIA  . Psoriasis   . Wears dentures    full upper, partial lower.  Has, does not wear    SURGICAL HISTORY: Past Surgical History:  Procedure Laterality Date  . COLONOSCOPY WITH PROPOFOL N/A 02/23/2017   Procedure: COLONOSCOPY WITH PROPOFOL;  Surgeon: [name], MD;  Location: [place];  Service: Endoscopy;  Laterality: N/A;  Diabetic - oral meds  . CYST EXCISION  Aug. 2016   on back  . CYSTOSCOPY W/ RETROGRADES Right 06/28/2018   Procedure:  CYSTOSCOPY WITH RETROGRADE PYELOGRAM;  Surgeon: Billey Co, MD;  Location: [facility];  Service: Urology;  Laterality: Right;  . CYSTOSCOPY W/ URETERAL STENT REMOVAL Right 07/22/2018   Procedure: CYSTOSCOPY WITH STENT REMOVAL;  Surgeon: Billey Co, MD;  Location: [facility];  Service: Urology;  Laterality: Right;  . CYSTOSCOPY WITH BIOPSY Right 06/28/2018   Procedure: CYSTOSCOPY WITH RENAL BIOPSY;  Surgeon: Billey Co, MD;  Location: [facility];  Service: Urology;  Laterality: Right;  . CYSTOSCOPY WITH URETEROSCOPY AND STENT PLACEMENT Right 06/28/2018   Procedure: CYSTOSCOPY WITH URETEROSCOPY AND STENT PLACEMENT;  Surgeon: Billey Co, MD;  Location: [facility];  Service: Urology;  Laterality: Right;  . FULGURATION OF BLADDER TUMOR N/A 06/28/2018   Procedure: CYSTOSCOPY BLADDER BIOPSY, FULGERATION OF BLADDER;  Surgeon: Billey Co, MD;  Location: [facility];  Service: Urology;  Laterality: N/A;  . KNEE SURGERY Left   . LAPAROSCOPIC NEPHRECTOMY, HAND ASSISTED Right 07/22/2018   Procedure: HAND ASSISTED LAPAROSCOPIC NEPHRECTOMY;  Surgeon: Billey Co, MD;  Location: [facility];  Service: Urology;  Laterality: Right;  . NASAL SINUS SURGERY  03/15  . POLYPECTOMY N/A 02/23/2017   Procedure: POLYPECTOMY;  Surgeon: [name], MD;  Location: [place];  Service: Endoscopy;  Laterality: N/A;  . tooth abstraction    . TRANSURETHRAL RESECTION OF BLADDER TUMOR N/A 05/12/2019   Procedure: TRANSURETHRAL RESECTION OF BLADDER TUMOR (TURBT);  Surgeon: Billey Co, MD;  Location: [facility];  Service: Urology;  Laterality: N/A;  . VARICOCELE EXCISION      SOCIAL HISTORY: Social History   Socioeconomic History  .  Marital status: Married    Spouse name: Not on file  . Number of children: Not on file  . Years of education: Not on file  . Highest education level: Not on file  Occupational History  . Not on file  Tobacco Use  . Smoking status: Former Smoker    Types:  Cigarettes    Quit date: 06/15/2013    Years since quitting: 6.3  . Smokeless tobacco: Never Used  Vaping Use  . Vaping Use: Never used  Substance and Sexual Activity  . Alcohol use: No    Alcohol/week: 0.0 standard drinks  . Drug use: No  . Sexual activity: Yes  Other Topics Concern  . Not on file  Social History Narrative  . Not on file   Social Determinants of Health   Financial Resource Strain:   . Difficulty of Paying Living Expenses:   Food Insecurity:   . Worried About Charity fundraiser in the Last Year:   . Arboriculturist in the Last Year:   Transportation Needs:   . Film/video editor (Medical):   Marland Kitchen Lack of Transportation (Non-Medical):   Physical Activity:   . Days of Exercise per Week:   . Minutes of Exercise per Session:   Stress:   . Feeling of Stress :   Social Connections:   . Frequency of Communication with Friends and Family:   . Frequency of Social Gatherings with Friends and Family:   . Attends Religious Services:   . Active Member of Clubs or Organizations:   . Attends Archivist Meetings:   Marland Kitchen Marital Status:   Intimate Partner Violence:   . Fear of Current or Ex-Partner:   . Emotionally Abused:   Marland Kitchen Physically Abused:   . Sexually Abused:     FAMILY HISTORY: Family History  Problem Relation Age of Onset  . Heart disease Father   . Hypertension Father   . COPD Father   . Colon cancer Maternal Grandmother   . Heart attack Paternal Grandfather     ALLERGIES:  is allergic to glucotrol [glipizide].  MEDICATIONS:  Current Outpatient Medications  Medication Sig Dispense Refill  . amLODipine (NORVASC) 2.5 MG tablet Take 1 tablet (2.5 mg total) by mouth daily. 30 tablet 3  . aspirin EC 81 MG tablet Take 81 mg by mouth daily.    . [place]-Glucosamine-Vit D (OSTEO BI-FLEX ONE PER DAY) TABS Take 1 tablet by mouth daily.    . diclofenac Sodium (VOLTAREN) 1 % GEL Apply 2 g topically 4 (four) times daily. 100 g 1  . Glucose Blood  (ACCU-CHEK AVIVA PLUS VI) by In Vitro route.    Marland Kitchen JARDIANCE 10 MG TABS tablet TAKE 1 TABLET BY MOUTH DAILY (Patient taking differently: Take 10 mg by mouth daily. ) 30 tablet 2  . lisinopril (PRINIVIL,ZESTRIL) 10 MG tablet TAKE 1 TABLET(10 MG) BY MOUTH DAILY (Patient taking differently: Take 10 mg by mouth daily. ) 90 tablet 1  . metFORMIN (GLUCOPHAGE-XR) 500 MG 24 hr tablet Take 3 pills by mouth daily for 4-5 days, then increase to 4 pills daily (if tolerated) (Patient taking differently: Take 2,000 mg by mouth daily with breakfast. ) 360 tablet 0  . Multiple Vitamin (MULTIVITAMIN) tablet Take 1 tablet by mouth daily.    . Omega-3 Fatty Acids (FISH OIL) 1200 MG CAPS Take 1,200 mg by mouth daily.     . Omeprazole (PRILOSEC PO) Take 20 mg by mouth daily as needed (heart burn).     Marland Kitchen  OZEMPIC, 0.25 OR 0.5 MG/DOSE, 2 MG/1.5ML SOPN once a week.     . pioglitazone (ACTOS) 45 MG tablet TAKE 1 TABLET(45 MG) BY MOUTH DAILY (Patient taking differently: Take 45 mg by mouth daily. ) 90 tablet 1  . rosuvastatin (CRESTOR) 10 MG tablet Take 1 tablet (10 mg total) by mouth at bedtime. 90 tablet 1  . clobetasol cream (TEMOVATE) 1.54 % Apply 1 application topically 2 (two) times daily. To affected skin; too strong for face, groin, underarms (Patient not taking: Reported on 07/17/2019) 30 g 2  . INLYTA 5 MG tablet TAKE ONE TABLET BY MOUTH TWICE A DAY (12 HOURS APART) WITH A FULL GLASS OF WATER. MAY TAKE WITH OR WITHOUT FOOD. AVOID GRAPEFRUIT PRODUCTS. (Patient not taking: Reported on 09/25/2019) 60 tablet 0  . loperamide (IMODIUM) 2 MG capsule Take 1 capsule (2 mg total) by mouth See admin instructions. With onset of loose stool, take 4mg  followed by 2mg  every 2 hours until 12 hours have passed without loose bowel movement. Maximum: 16 mg/day (Patient not taking: Reported on 10/29/2019) 60 capsule 0  . prochlorperazine (COMPAZINE) 10 MG tablet Take 1 tablet (10 mg total) by mouth every 6 (six) hours as needed for nausea or  vomiting. (Patient not taking: Reported on 10/29/2019) 15 tablet 0   No current facility-administered medications for this visit.     PHYSICAL EXAMINATION: ECOG PERFORMANCE STATUS: 0 - Asymptomatic Vitals:   10/29/19 1045  BP: 118/79  Pulse: 98  Resp: 18  Temp: (!) 96.4 F (35.8 C)   Filed Weights   10/29/19 1045  Weight: 220 lb 3.2 oz (99.9 kg)    Physical Exam Constitutional:      General: He is not in acute distress. HENT:     Head: Normocephalic and atraumatic.  Eyes:     General: No scleral icterus.    Pupils: Pupils are equal, round, and reactive to light.  Cardiovascular:     Rate and Rhythm: Normal rate and regular rhythm.     Heart sounds: Normal heart sounds.  Pulmonary:     Effort: Pulmonary effort is normal. No respiratory distress.     Breath sounds: No wheezing.  Abdominal:     General: Bowel sounds are normal. There is no distension.     Palpations: Abdomen is soft. There is no mass.     Tenderness: There is no abdominal tenderness.  Musculoskeletal:        General: No deformity. Normal range of motion.     Cervical back: Normal range of motion and neck supple.  Skin:    General: Skin is warm and dry.     Findings: No erythema or rash.  Neurological:     Mental Status: He is alert and oriented to person, place, and time. Mental status is at baseline.     Cranial Nerves: No cranial nerve deficit.     Coordination: Coordination normal.  Psychiatric:        Mood and Affect: Mood normal.     LABORATORY DATA:  I have reviewed the data as listed Lab Results  Component Value Date   WBC 7.1 10/03/2019   HGB 13.8 10/03/2019   HCT 41.2 10/03/2019   MCV 89.8 10/03/2019   PLT 229 10/03/2019   Recent Labs    09/18/19 0841 09/18/19 0841 09/22/19 1046 09/22/19 1046 09/25/19 0812 09/25/19 0812 10/03/19 0853 10/21/19 0922 10/29/19 1118  NA 136  --   --   --  135  --  137  --   --   K 4.2  --   --   --  4.3  --  4.4  --   --   CL 99  --   --    --  99  --  105  --   --   CO2 25  --   --   --  26  --  25  --   --   GLUCOSE 101*  --   --   --  130*  --  107*  --   --   BUN 24*  --   --   --  24*  --  32*  --   --   CREATININE 1.27*  --   --   --  1.15  --  1.16  --   --   CALCIUM 9.5  --   --   --  9.6  --  9.1  --   --   GFRNONAA >60  --   --   --  >60  --  >60  --   --   GFRAA >60  --   --   --  >60  --  >60  --   --   PROT 7.5   < > 6.5   < > 7.5   < > 6.9 6.5 7.0  ALBUMIN 4.4   < > 3.7   < > 4.1   < > 4.0 4.3 4.0  AST 192*   < > 180*   < > 216*   < > 217* 133* 80*  ALT 297*   < > 360*   < > 466*   < > 480* 311* 166*  ALKPHOS 69   < > 57   < > 62   < > 60 69 55  BILITOT 1.2   < > 0.9   < > 1.1   < > 0.8 0.6 0.9  BILIDIR  --   --  0.2  --   --   --   --  0.18 0.2  IBILI  --   --  0.7  --   --   --   --   --  0.7   < > = values in this interval not displayed.   Iron/TIBC/Ferritin/ %Sat    Component Value Date/Time   IRON 118 09/25/2019 1115   TIBC 454 (H) 09/25/2019 1115   FERRITIN 236 09/25/2019 1115   IRONPCTSAT 26 09/25/2019 1115      RADIOGRAPHIC STUDIES: I have personally reviewed the radiological images as listed and agreed with the findings in the report.  CT Chest W Contrast  Result Date: 09/12/2019 CLINICAL DATA:  Clear cell renal carcinoma. Pulmonary metastasis. Bladder mass. Patient status post RIGHT nephrectomy. EXAM: CT CHEST, ABDOMEN, AND PELVIS WITH CONTRAST TECHNIQUE: Multidetector CT imaging of the chest, abdomen and pelvis was performed following the standard protocol during bolus administration of intravenous contrast. CONTRAST:  138mL OMNIPAQUE IOHEXOL 300 MG/ML  SOLN COMPARISON:  Chest CT 05/28/2019, CT abdomen pelvis 04/30/2019 FINDINGS: CT CHEST FINDINGS Cardiovascular: No significant vascular findings. Normal heart size. No pericardial effusion. Mediastinum/Nodes: No axillary supraclavicular adenopathy. No mediastinal hilar adenopathy no pericardial effusion. Esophagus normal. Lungs/Pleura: Decrease in  size of pulmonary nodules. For example: LEFT upper lobe nodule measuring 2 mm (image 72/series 3) decreased from 8 mm. Rounded nodule in the superior segment of the LEFT lower lobe previously measuring 6 mm has resolved. RIGHT middle lobe nodule  measuring 3 mm (image 94/3) decreased from 10 mm. RIGHT lower lobe nodule resolved. No new pulmonary nodules. Musculoskeletal: No aggressive osseous lesion. CT ABDOMEN AND PELVIS FINDINGS Hepatobiliary: No focal hepatic lesion. No biliary ductal dilatation. Gallbladder is normal. Common bile duct is normal. Pancreas: Pancreas is normal. No ductal dilatation. No pancreatic inflammation. Spleen: Normal spleen Adrenals/urinary tract: RIGHT adrenal gland normal. Small focus of postsurgical tissue in the RIGHT nephrectomy bed (image 71/2) is unchanged and favored benign postsurgical granulation/scarring. No new nodularity. LEFT adrenal gland normal. High-density cyst in the cortex of the LEFT kidney measures 25 mm (image 34/2) is unchanged from 25 mm. This lesion previously characterized on CT 06/21/2018 as nonenhancing. Adjacent simple fluid attenuation cyst measuring 16 mm (image 76/2) is unchanged. No new lesion in the LEFT kidney. LEFT ureter normal. No enhancing lesion in the bladder. Interval resection of RIGHT bladder wall tumor. Stomach/Bowel: Stomach, small bowel, appendix, and cecum are normal. The colon and rectosigmoid colon are normal. Vascular/Lymphatic: Abdominal aorta is normal caliber with atherosclerotic calcification. There is no retroperitoneal or periportal lymphadenopathy. No pelvic lymphadenopathy. Reproductive: Prostate normal Other: No free fluid. Musculoskeletal: No aggressive osseous lesion IMPRESSION: Chest Impression: 1. Bilateral pulmonary nodules decreased significantly size. Several nodules have resolved completely. 2. No new pulmonary nodules. Abdomen / Pelvis Impression: 1. Bosniak 1 and Bosniak 2 benign cysts of the LEFT kidney. No interval  change. 2. Stable RIGHT nephrectomy bed with mild scarring. 3. Interval resection of RIGHT bladder wall tumor. No enhancing lesion the bladder. 4. No evidence of metastatic disease. Electronically Signed   By: Suzy Bouchard M.D.   On: 09/12/2019 09:50   CT Abdomen Pelvis W Contrast  Result Date: 09/12/2019 CLINICAL DATA:  Clear cell renal carcinoma. Pulmonary metastasis. Bladder mass. Patient status post RIGHT nephrectomy. EXAM: CT CHEST, ABDOMEN, AND PELVIS WITH CONTRAST TECHNIQUE: Multidetector CT imaging of the chest, abdomen and pelvis was performed following the standard protocol during bolus administration of intravenous contrast. CONTRAST:  140mL OMNIPAQUE IOHEXOL 300 MG/ML  SOLN COMPARISON:  Chest CT 05/28/2019, CT abdomen pelvis 04/30/2019 FINDINGS: CT CHEST FINDINGS Cardiovascular: No significant vascular findings. Normal heart size. No pericardial effusion. Mediastinum/Nodes: No axillary supraclavicular adenopathy. No mediastinal hilar adenopathy no pericardial effusion. Esophagus normal. Lungs/Pleura: Decrease in size of pulmonary nodules. For example: LEFT upper lobe nodule measuring 2 mm (image 72/series 3) decreased from 8 mm. Rounded nodule in the superior segment of the LEFT lower lobe previously measuring 6 mm has resolved. RIGHT middle lobe nodule measuring 3 mm (image 94/3) decreased from 10 mm. RIGHT lower lobe nodule resolved. No new pulmonary nodules. Musculoskeletal: No aggressive osseous lesion. CT ABDOMEN AND PELVIS FINDINGS Hepatobiliary: No focal hepatic lesion. No biliary ductal dilatation. Gallbladder is normal. Common bile duct is normal. Pancreas: Pancreas is normal. No ductal dilatation. No pancreatic inflammation. Spleen: Normal spleen Adrenals/urinary tract: RIGHT adrenal gland normal. Small focus of postsurgical tissue in the RIGHT nephrectomy bed (image 71/2) is unchanged and favored benign postsurgical granulation/scarring. No new nodularity. LEFT adrenal gland normal.  High-density cyst in the cortex of the LEFT kidney measures 25 mm (image 34/2) is unchanged from 25 mm. This lesion previously characterized on CT 06/21/2018 as nonenhancing. Adjacent simple fluid attenuation cyst measuring 16 mm (image 76/2) is unchanged. No new lesion in the LEFT kidney. LEFT ureter normal. No enhancing lesion in the bladder. Interval resection of RIGHT bladder wall tumor. Stomach/Bowel: Stomach, small bowel, appendix, and cecum are normal. The colon and rectosigmoid colon are normal. Vascular/Lymphatic:  Abdominal aorta is normal caliber with atherosclerotic calcification. There is no retroperitoneal or periportal lymphadenopathy. No pelvic lymphadenopathy. Reproductive: Prostate normal Other: No free fluid. Musculoskeletal: No aggressive osseous lesion IMPRESSION: Chest Impression: 1. Bilateral pulmonary nodules decreased significantly size. Several nodules have resolved completely. 2. No new pulmonary nodules. Abdomen / Pelvis Impression: 1. Bosniak 1 and Bosniak 2 benign cysts of the LEFT kidney. No interval change. 2. Stable RIGHT nephrectomy bed with mild scarring. 3. Interval resection of RIGHT bladder wall tumor. No enhancing lesion the bladder. 4. No evidence of metastatic disease. Electronically Signed   By: Suzy Bouchard M.D.   On: 09/12/2019 09:50   US LIVER DOPPLER  Result Date: 10/03/2019 CLINICAL DATA:  Abnormal LFTs. EXAM: LIMITED ABDOMINAL ULTRASOUND US HEPATIC DOPPLER ARTERIAL/VENOUS ULTRASOUND COMPARISON:  CT 09/12/2019 FINDINGS: Gallbladder: Physiologically distended without stones, wall thickening, or pericholecystic fluid. Sonographer reports no sonographic Murphy's sign. Common bile duct:  Normal in caliber, 2.50mm diameter. Liver: Homogeneous in echotexture without focal lesion or intrahepatic bile duct dilatation. Portal Vein 1.2 cm diameter. No occlusion or thrombus. Velocities (all hepatopetal): Main:  41-49 cm/sec Right: 26 cm/sec Left:  24 cm/sec Hepatic Vein  Velocities (all hepatofugal): Right:  22 cm/sec Middle:  25 cm/sec Left:  12 cm/sec Hepatic Artery Velocity: 79 cm/sec IVC:  Patent.  Velocity 27 cm/sec Spleen:  No focal lesion, 5.3 x 9.6 x 3.5 cm (93 cc). Splenic Vein: No occlusion or thrombus.  Velocity: 18 cm/sec Varices: None seen Ascites: Absent IMPRESSION: 1. Unremarkable hepatic vascular Doppler evaluation. 2. Normal gallbladder. Electronically Signed   By: Lucrezia Europe M.D.   On: 10/03/2019 11:16   US Abdomen Limited RUQ  Result Date: 10/03/2019 CLINICAL DATA:  Abnormal LFTs. EXAM: LIMITED ABDOMINAL ULTRASOUND US HEPATIC DOPPLER ARTERIAL/VENOUS ULTRASOUND COMPARISON:  CT 09/12/2019 FINDINGS: Gallbladder: Physiologically distended without stones, wall thickening, or pericholecystic fluid. Sonographer reports no sonographic Murphy's sign. Common bile duct:  Normal in caliber, 2.64mm diameter. Liver: Homogeneous in echotexture without focal lesion or intrahepatic bile duct dilatation. Portal Vein 1.2 cm diameter. No occlusion or thrombus. Velocities (all hepatopetal): Main:  41-49 cm/sec Right: 26 cm/sec Left:  24 cm/sec Hepatic Vein Velocities (all hepatofugal): Right:  22 cm/sec Middle:  25 cm/sec Left:  12 cm/sec Hepatic Artery Velocity: 79 cm/sec IVC:  Patent.  Velocity 27 cm/sec Spleen:  No focal lesion, 5.3 x 9.6 x 3.5 cm (93 cc). Splenic Vein: No occlusion or thrombus.  Velocity: 18 cm/sec Varices: None seen Ascites: Absent IMPRESSION: 1. Unremarkable hepatic vascular Doppler evaluation. 2. Normal gallbladder. Electronically Signed   By: Lucrezia Europe M.D.   On: 10/03/2019 11:16      ASSESSMENT & PLAN:  1. Clear cell carcinoma of right kidney ([place])   2. 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 Axitinib Keytruda have been on hold due to liver toxicities -transaminitis. Patient has had a gastroenterology work-up. Transaminitis trending down. Recommend to repeat LFT today. Discussed with  patient about patient plan. Recommend monitor LFT and when LFT normalized, consider to resume Keytruda therapy first. He agrees with the plan.  Nausea, continue antiemetics. Hypertension, blood pressure is well controlled currently. Today's blood pressure is 118/79. Blood pressure regimen was adjusted when he was on axitinib. Amlodipine 2.5 mg were added. Given that his blood pressure is normal and on the low end, I recommend patient to discontinue amlodipine for now continue monitor his blood pressure.  We spent sufficient time to discuss many aspect  of care, questions were answered to patient's satisfaction. All questions were answered. The patient knows to call the clinic with any problems questions or concerns.  Return of visit: Repeat LFT 1 week. Future treatment date to be determined.  Earlie Server, MD, PhD Hematology Oncology [facility] [facility] Pager- [phone] 10/29/2019

## 2019-10-30 ENCOUNTER — Telehealth: Payer: Self-pay

## 2019-10-30 NOTE — Telephone Encounter (Signed)
Done. Pt is aware.

## 2019-10-30 NOTE — Telephone Encounter (Signed)
Mychart message sent to pt letting him know of liver enzyme levels.

## 2019-10-30 NOTE — Telephone Encounter (Signed)
-----   Message from Earlie Server, MD sent at 10/29/2019  5:09 PM EDT ----- Please let patient know that his liver enzyme levels continue to improve. Recommend repeat LFT in 1 week. Please arrange. Future plan to be determined.

## 2019-11-04 ENCOUNTER — Other Ambulatory Visit: Payer: Self-pay

## 2019-11-04 ENCOUNTER — Inpatient Hospital Stay: Payer: BC Managed Care – PPO

## 2019-11-04 DIAGNOSIS — Z5111 Encounter for antineoplastic chemotherapy: Secondary | ICD-10-CM

## 2019-11-04 DIAGNOSIS — C641 Malignant neoplasm of right kidney, except renal pelvis: Secondary | ICD-10-CM

## 2019-11-04 DIAGNOSIS — C649 Malignant neoplasm of unspecified kidney, except renal pelvis: Secondary | ICD-10-CM | POA: Diagnosis not present

## 2019-11-04 DIAGNOSIS — Z5112 Encounter for antineoplastic immunotherapy: Secondary | ICD-10-CM

## 2019-11-04 DIAGNOSIS — R7401 Elevation of levels of liver transaminase levels: Secondary | ICD-10-CM

## 2019-11-04 LAB — CBC WITH DIFFERENTIAL/PLATELET
Abs Immature Granulocytes: 0.03 10*3/uL (ref 0.00–0.07)
Basophils Absolute: 0 10*3/uL (ref 0.0–0.1)
Basophils Relative: 1 %
Eosinophils Absolute: 0.2 10*3/uL (ref 0.0–0.5)
Eosinophils Relative: 3 %
HCT: 45.3 % (ref 39.0–52.0)
Hemoglobin: 14.8 g/dL (ref 13.0–17.0)
Immature Granulocytes: 0 %
Lymphocytes Relative: 28 %
Lymphs Abs: 1.9 10*3/uL (ref 0.7–4.0)
MCH: 30 pg (ref 26.0–34.0)
MCHC: 32.7 g/dL (ref 30.0–36.0)
MCV: 91.9 fL (ref 80.0–100.0)
Monocytes Absolute: 0.5 10*3/uL (ref 0.1–1.0)
Monocytes Relative: 8 %
Neutro Abs: 4.2 10*3/uL (ref 1.7–7.7)
Neutrophils Relative %: 60 %
Platelets: 224 10*3/uL (ref 150–400)
RBC: 4.93 MIL/uL (ref 4.22–5.81)
RDW: 14.2 % (ref 11.5–15.5)
WBC: 6.9 10*3/uL (ref 4.0–10.5)
nRBC: 0 % (ref 0.0–0.2)

## 2019-11-04 LAB — COMPREHENSIVE METABOLIC PANEL
ALT: 104 U/L — ABNORMAL HIGH (ref 0–44)
AST: 49 U/L — ABNORMAL HIGH (ref 15–41)
Albumin: 4.3 g/dL (ref 3.5–5.0)
Alkaline Phosphatase: 59 U/L (ref 38–126)
Anion gap: 10 (ref 5–15)
BUN: 21 mg/dL — ABNORMAL HIGH (ref 6–20)
CO2: 26 mmol/L (ref 22–32)
Calcium: 9.4 mg/dL (ref 8.9–10.3)
Chloride: 103 mmol/L (ref 98–111)
Creatinine, Ser: 1.29 mg/dL — ABNORMAL HIGH (ref 0.61–1.24)
GFR calc Af Amer: >60 mL/min (ref >60)
GFR calc non Af Amer: >60 mL/min (ref >60)
Glucose, Bld: 133 mg/dL — ABNORMAL HIGH (ref 70–99)
Potassium: 4.7 mmol/L (ref 3.5–5.1)
Sodium: 139 mmol/L (ref 135–145)
Total Bilirubin: 0.9 mg/dL (ref 0.3–1.2)
Total Protein: 7.5 g/dL (ref 6.5–8.1)

## 2019-11-04 LAB — PROTEIN, URINE, RANDOM: Total Protein, Urine: 10 mg/dL

## 2019-11-06 ENCOUNTER — Telehealth: Payer: Self-pay

## 2019-11-06 ENCOUNTER — Other Ambulatory Visit: Payer: Self-pay

## 2019-11-06 DIAGNOSIS — C641 Malignant neoplasm of right kidney, except renal pelvis: Secondary | ICD-10-CM

## 2019-11-06 DIAGNOSIS — R7401 Elevation of levels of liver transaminase levels: Secondary | ICD-10-CM

## 2019-11-06 NOTE — Telephone Encounter (Signed)
-----   Message from Earlie Server, MD sent at 11/06/2019  2:36 PM EDT ----- Please arrange patient to repeat LFT in 1 week.  Please order.  Please ask lab not to pull orders for CBC or protein urine thank you

## 2019-11-06 NOTE — Telephone Encounter (Signed)
Done. Pt has been scheduled asw requested.. Pt is aware of his scheduled 11/13/19 lab (LFT only) appt @ 10:45

## 2019-11-13 ENCOUNTER — Other Ambulatory Visit: Payer: Self-pay

## 2019-11-13 ENCOUNTER — Telehealth: Payer: Self-pay

## 2019-11-13 ENCOUNTER — Inpatient Hospital Stay: Payer: BC Managed Care – PPO | Attending: Oncology

## 2019-11-13 DIAGNOSIS — R7401 Elevation of levels of liver transaminase levels: Secondary | ICD-10-CM | POA: Diagnosis not present

## 2019-11-13 DIAGNOSIS — Z791 Long term (current) use of non-steroidal anti-inflammatories (NSAID): Secondary | ICD-10-CM | POA: Diagnosis not present

## 2019-11-13 DIAGNOSIS — C7800 Secondary malignant neoplasm of unspecified lung: Secondary | ICD-10-CM | POA: Diagnosis not present

## 2019-11-13 DIAGNOSIS — Z79899 Other long term (current) drug therapy: Secondary | ICD-10-CM | POA: Diagnosis not present

## 2019-11-13 DIAGNOSIS — K219 Gastro-esophageal reflux disease without esophagitis: Secondary | ICD-10-CM | POA: Insufficient documentation

## 2019-11-13 DIAGNOSIS — Z7982 Long term (current) use of aspirin: Secondary | ICD-10-CM | POA: Diagnosis not present

## 2019-11-13 DIAGNOSIS — L409 Psoriasis, unspecified: Secondary | ICD-10-CM | POA: Insufficient documentation

## 2019-11-13 DIAGNOSIS — Z905 Acquired absence of kidney: Secondary | ICD-10-CM | POA: Insufficient documentation

## 2019-11-13 DIAGNOSIS — C7911 Secondary malignant neoplasm of bladder: Secondary | ICD-10-CM | POA: Diagnosis not present

## 2019-11-13 DIAGNOSIS — Z87891 Personal history of nicotine dependence: Secondary | ICD-10-CM | POA: Insufficient documentation

## 2019-11-13 DIAGNOSIS — I10 Essential (primary) hypertension: Secondary | ICD-10-CM | POA: Diagnosis not present

## 2019-11-13 DIAGNOSIS — Z5112 Encounter for antineoplastic immunotherapy: Secondary | ICD-10-CM | POA: Insufficient documentation

## 2019-11-13 DIAGNOSIS — Z7984 Long term (current) use of oral hypoglycemic drugs: Secondary | ICD-10-CM | POA: Insufficient documentation

## 2019-11-13 DIAGNOSIS — E119 Type 2 diabetes mellitus without complications: Secondary | ICD-10-CM | POA: Diagnosis not present

## 2019-11-13 DIAGNOSIS — E785 Hyperlipidemia, unspecified: Secondary | ICD-10-CM | POA: Diagnosis not present

## 2019-11-13 DIAGNOSIS — C641 Malignant neoplasm of right kidney, except renal pelvis: Secondary | ICD-10-CM | POA: Diagnosis present

## 2019-11-13 LAB — HEPATIC FUNCTION PANEL
ALT: 46 U/L — ABNORMAL HIGH (ref 0–44)
AST: 31 U/L (ref 15–41)
Albumin: 4 g/dL (ref 3.5–5.0)
Alkaline Phosphatase: 49 U/L (ref 38–126)
Bilirubin, Direct: 0.2 mg/dL (ref 0.0–0.2)
Indirect Bilirubin: 0.7 mg/dL (ref 0.3–0.9)
Total Bilirubin: 0.9 mg/dL (ref 0.3–1.2)
Total Protein: 7 g/dL (ref 6.5–8.1)

## 2019-11-13 NOTE — Telephone Encounter (Signed)
-----   Message from Earlie Server, MD sent at 11/13/2019  3:58 PM EDT ----- Please let patient know that liver function has returned to close to his baseline. 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 Assuming no infusion next week, Please schedule patient to have lab [facility] 11/24/2019.

## 2019-11-13 NOTE — Telephone Encounter (Signed)
Patient has been notified and will see appts on Mychart.

## 2019-11-13 NOTE — Telephone Encounter (Signed)
Please schedule as requested and I will contact pt will appt details. Thanks

## 2019-11-13 NOTE — Telephone Encounter (Signed)
Done  Appts has been sched as requested.

## 2019-11-23 ENCOUNTER — Encounter: Payer: Self-pay | Admitting: Oncology

## 2019-11-24 ENCOUNTER — Inpatient Hospital Stay: Payer: BC Managed Care – PPO

## 2019-11-24 ENCOUNTER — Other Ambulatory Visit: Payer: Self-pay

## 2019-11-24 ENCOUNTER — Encounter: Payer: Self-pay | Admitting: Oncology

## 2019-11-24 ENCOUNTER — Inpatient Hospital Stay (HOSPITAL_BASED_OUTPATIENT_CLINIC_OR_DEPARTMENT_OTHER): Payer: BC Managed Care – PPO | Admitting: Oncology

## 2019-11-24 VITALS — BP 133/80 | HR 80 | Temp 96.9°F | Resp 18 | Wt 217.9 lb

## 2019-11-24 DIAGNOSIS — C641 Malignant neoplasm of right kidney, except renal pelvis: Secondary | ICD-10-CM | POA: Diagnosis not present

## 2019-11-24 DIAGNOSIS — Z5111 Encounter for antineoplastic chemotherapy: Secondary | ICD-10-CM

## 2019-11-24 DIAGNOSIS — R7401 Elevation of levels of liver transaminase levels: Secondary | ICD-10-CM

## 2019-11-24 DIAGNOSIS — Z5112 Encounter for antineoplastic immunotherapy: Secondary | ICD-10-CM | POA: Diagnosis not present

## 2019-11-24 LAB — CBC WITH DIFFERENTIAL/PLATELET
Abs Immature Granulocytes: 0.02 10*3/uL (ref 0.00–0.07)
Basophils Absolute: 0 10*3/uL (ref 0.0–0.1)
Basophils Relative: 0 %
Eosinophils Absolute: 0.3 10*3/uL (ref 0.0–0.5)
Eosinophils Relative: 4 %
HCT: 42.9 % (ref 39.0–52.0)
Hemoglobin: 14.5 g/dL (ref 13.0–17.0)
Immature Granulocytes: 0 %
Lymphocytes Relative: 29 %
Lymphs Abs: 2 10*3/uL (ref 0.7–4.0)
MCH: 30.4 pg (ref 26.0–34.0)
MCHC: 33.8 g/dL (ref 30.0–36.0)
MCV: 89.9 fL (ref 80.0–100.0)
Monocytes Absolute: 0.6 10*3/uL (ref 0.1–1.0)
Monocytes Relative: 8 %
Neutro Abs: 4 10*3/uL (ref 1.7–7.7)
Neutrophils Relative %: 59 %
Platelets: 192 10*3/uL (ref 150–400)
RBC: 4.77 MIL/uL (ref 4.22–5.81)
RDW: 13.4 % (ref 11.5–15.5)
WBC: 7 10*3/uL (ref 4.0–10.5)
nRBC: 0 % (ref 0.0–0.2)

## 2019-11-24 LAB — COMPREHENSIVE METABOLIC PANEL
ALT: 27 U/L (ref 0–44)
AST: 24 U/L (ref 15–41)
Albumin: 4.2 g/dL (ref 3.5–5.0)
Alkaline Phosphatase: 52 U/L (ref 38–126)
Anion gap: 8 (ref 5–15)
BUN: 21 mg/dL — ABNORMAL HIGH (ref 6–20)
CO2: 26 mmol/L (ref 22–32)
Calcium: 9.2 mg/dL (ref 8.9–10.3)
Chloride: 103 mmol/L (ref 98–111)
Creatinine, Ser: 1.14 mg/dL (ref 0.61–1.24)
GFR calc Af Amer: >60 mL/min (ref >60)
GFR calc non Af Amer: >60 mL/min (ref >60)
Glucose, Bld: 102 mg/dL — ABNORMAL HIGH (ref 70–99)
Potassium: 4.4 mmol/L (ref 3.5–5.1)
Sodium: 137 mmol/L (ref 135–145)
Total Bilirubin: 0.8 mg/dL (ref 0.3–1.2)
Total Protein: 7.2 g/dL (ref 6.5–8.1)

## 2019-11-24 LAB — PROTEIN, URINE, RANDOM: Total Protein, Urine: 14 mg/dL

## 2019-11-24 MED ORDER — HEPARIN SOD (PORK) LOCK FLUSH 100 UNIT/ML IV SOLN
INTRAVENOUS | Status: AC
  Filled 2019-11-24: qty 5

## 2019-11-24 MED ORDER — SODIUM CHLORIDE 0.9 % IV SOLN
Freq: Once | INTRAVENOUS | Status: AC
  Filled 2019-11-24: qty 250

## 2019-11-24 MED ORDER — SODIUM CHLORIDE 0.9 % IV SOLN
200.0000 mg | Freq: Once | INTRAVENOUS | Status: AC
  Administered 2019-11-24: 200 mg via INTRAVENOUS
  Filled 2019-11-24: qty 8

## 2019-11-24 NOTE — Progress Notes (Signed)
Hematology/Oncology follow up note [facility] Telephone:([phone] Fax:([phone]   Patient Care Team: [name], NP as PCP - General [name], [name], MD as PCP - Family Medicine (Family Medicine) [name], MD as Consulting Physician (General Surgery) [name], [name], MD as Attending Physician (Family Medicine) [name], [name], MD as Consulting Physician (Dermatology)  REFERRING PROVIDER: [name], NP  CHIEF COMPLAINTS/REASON FOR VISIT:  Follow-up of kidney cancer  HISTORY OF PRESENTING ILLNESS:   Matthew Woodard is a  54 y.o.  male with PMH listed below was seen in consultation at the request of  [name], NP  for evaluation of kidney cancer Patient's cancer history dated back to February 2020 when he developed gross hematuria with small clots and right-sided flank pain.  Patient had a CT urogram done which showed right 4 cm central enhancing renal mass with invasion into the collecting system.  X-ray of chest was performed to see complete staging which showed no concerning findings. 06/28/2018 patient underwent a cystoscopy, bladder biopsy and a right retrograde pyelogram with intraoperative interpretation, right diagnostic ureteroscopy, right renal pelvis biopsy, right ureteral stent placement.  biopsy showed atypical cell clusters, with extensive crush artifact, nondiagnostic.   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  Surgical margins negative for tumor.pT3a,Nx Patient has been on surveillance after surgery. 10/28/2018 CT abdomen pelvis with contrast showed minimal fluid and or postoperative changes within the right renal fossa.  No evidence of metastatic disease or recurrence.  Patient has left kidney lesion previously characterized as  nonenhancing cyst by multi phasic contrast-enhanced CT. Patient reports an episode of gross hematuria in early December 2020, patient is due for 63-month surveillance CT scan. 04/30/2019 CT abdomen pelvis with contrast showed high attenuation mass at the right uretero vesicle junction, with extension into the bladder.  Bilateral pulmonary nodules, most indicated for metastatic disease.   Patient underwent cystoscopy and transurethral resection of irregular bladder tumor 3 cm.  Mass appeared to emanate from the right ureteral orifice.  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 Patient was referred to me for further work-up and management.  #Staging chest CT with contrast showed numerous bilateral pulmonary nodules.  Compatible with metastatic disease. Bone scan showed no osseous metastatic disease.   #History of tobacco abuse, former smoker.  Quit in 2015.  He denies any hemoptysis, chest pain, shortness of breath today. Patient reports that hematuria has completely resolved.  Denies any pain today. He is accompanied by his wife.  #History of cutaneous psoriasis, mostly on his right hand, currently on weekly methotrexate 12.5 mg with good symptom control. #NGS: [facility] PD L1 TPS 1%  # patient was referred to ENT for evaluation of headache and sinus pressure.  He had a ENT evaluation and feels that patient does not have sinusitis.  [name] recommended MRI brain to rule out PRES syndrome.  Axitinib has been held MRI brain was done and was negative.   #  off axitinib and Keytruda since the beginning of May due to transaminitis. Patient was seen and evaluated by gastroenterology [name]. Work-up was negative. Ultrasound liver vascular Doppler is negative. Recommend observation.  INTERVAL HISTORY Matthew Woodard is a 55 y.o. male who has above history reviewed by me today presents for follow up visit for management of metastatic  RCC,  Problems  and complaints are listed below: Patient has no new complaints today..    Review of Systems  Constitutional: Negative for appetite change, chills, fatigue, fever and unexpected weight change.  HENT:   Negative for hearing loss and voice change.   Eyes: Negative for eye problems and icterus.  Respiratory: Negative for chest tightness, cough and shortness of breath.   Cardiovascular: Negative for chest pain and leg swelling.  Gastrointestinal: Negative for abdominal distention, abdominal pain, blood in stool and nausea.  Endocrine: Negative for hot flashes.  Genitourinary: Negative for difficulty urinating, dysuria and frequency.   Musculoskeletal: Negative for arthralgias.  Skin: Negative for itching and rash.  Neurological: Negative for extremity weakness, headaches, light-headedness and numbness.  Hematological: Negative for adenopathy. Does not bruise/bleed easily.  Psychiatric/Behavioral: Negative for confusion.    MEDICAL HISTORY:  Past Medical History:  Diagnosis Date  . Arthritis    hands, left knee  . Cancer ([place])   . Diabetes mellitus without complication ([place])   . Eczema 01/08/2017  . GERD (gastroesophageal reflux disease)   . History of kidney cancer   . Hyperlipidemia LDL goal <100 11/02/2014  . Hypertension   . Kidney stone    YEAR H/O HEMATURIA  . Psoriasis   . Wears dentures    full upper, partial lower.  Has, does not wear    SURGICAL HISTORY: Past Surgical History:  Procedure Laterality Date  . COLONOSCOPY WITH PROPOFOL N/A 02/23/2017   Procedure: COLONOSCOPY WITH PROPOFOL;  Surgeon: [name], MD;  Location: [place];  Service: Endoscopy;  Laterality: N/A;  Diabetic - oral meds  . CYST EXCISION  Aug. 2016   on back  . CYSTOSCOPY W/ RETROGRADES Right 06/28/2018   Procedure: CYSTOSCOPY WITH RETROGRADE PYELOGRAM;  Surgeon: Billey Co, MD;  Location: [facility];  Service: Urology;  Laterality: Right;  . CYSTOSCOPY W/ URETERAL STENT REMOVAL  Right 07/22/2018   Procedure: CYSTOSCOPY WITH STENT REMOVAL;  Surgeon: Billey Co, MD;  Location: [facility];  Service: Urology;  Laterality: Right;  . CYSTOSCOPY WITH BIOPSY Right 06/28/2018   Procedure: CYSTOSCOPY WITH RENAL BIOPSY;  Surgeon: Billey Co, MD;  Location: [facility];  Service: Urology;  Laterality: Right;  . CYSTOSCOPY WITH URETEROSCOPY AND STENT PLACEMENT Right 06/28/2018   Procedure: CYSTOSCOPY WITH URETEROSCOPY AND STENT PLACEMENT;  Surgeon: Billey Co, MD;  Location: [facility];  Service: Urology;  Laterality: Right;  . FULGURATION OF BLADDER TUMOR N/A 06/28/2018   Procedure: CYSTOSCOPY BLADDER BIOPSY, FULGERATION OF BLADDER;  Surgeon: Billey Co, MD;  Location: [facility];  Service: Urology;  Laterality: N/A;  . KNEE SURGERY Left   . LAPAROSCOPIC NEPHRECTOMY, HAND ASSISTED Right 07/22/2018   Procedure: HAND ASSISTED LAPAROSCOPIC NEPHRECTOMY;  Surgeon: Billey Co, MD;  Location: [facility];  Service: Urology;  Laterality: Right;  . NASAL SINUS SURGERY  03/15  . POLYPECTOMY N/A 02/23/2017   Procedure: POLYPECTOMY;  Surgeon: [name], MD;  Location: [place];  Service: Endoscopy;  Laterality: N/A;  . tooth abstraction    . TRANSURETHRAL RESECTION OF BLADDER TUMOR N/A 05/12/2019   Procedure: TRANSURETHRAL RESECTION OF BLADDER TUMOR (TURBT);  Surgeon: Billey Co, MD;  Location: [facility];  Service: Urology;  Laterality: N/A;  . VARICOCELE EXCISION      SOCIAL HISTORY: Social History   Socioeconomic History  . Marital status: Married    Spouse name: Not on file  . Number of children: Not on file  .  Years of education: Not on file  . Highest education level: Not on file  Occupational History  . Not on file  Tobacco Use  . Smoking status: Former Smoker    Types: Cigarettes    Quit date: 06/15/2013    Years since quitting: 6.4  . Smokeless tobacco: Never Used  Vaping Use  . Vaping Use: Never used  Substance and Sexual Activity  .  Alcohol use: No    Alcohol/week: 0.0 standard drinks  . Drug use: No  . Sexual activity: Yes  Other Topics Concern  . Not on file  Social History Narrative  . Not on file   Social Determinants of Health   Financial Resource Strain:   . Difficulty of Paying Living Expenses:   Food Insecurity:   . Worried About Charity fundraiser in the Last Year:   . Arboriculturist in the Last Year:   Transportation Needs:   . Film/video editor (Medical):   Marland Kitchen Lack of Transportation (Non-Medical):   Physical Activity:   . Days of Exercise per Week:   . Minutes of Exercise per Session:   Stress:   . Feeling of Stress :   Social Connections:   . Frequency of Communication with Friends and Family:   . Frequency of Social Gatherings with Friends and Family:   . Attends Religious Services:   . Active Member of Clubs or Organizations:   . Attends Archivist Meetings:   Marland Kitchen Marital Status:   Intimate Partner Violence:   . Fear of Current or Ex-Partner:   . Emotionally Abused:   Marland Kitchen Physically Abused:   . Sexually Abused:     FAMILY HISTORY: Family History  Problem Relation Age of Onset  . Heart disease Father   . Hypertension Father   . COPD Father   . Colon cancer Maternal Grandmother   . Heart attack Paternal Grandfather     ALLERGIES:  is allergic to glucotrol [glipizide].  MEDICATIONS:  Current Outpatient Medications  Medication Sig Dispense Refill  . aspirin EC 81 MG tablet Take 81 mg by mouth daily.    . [place]-Glucosamine-Vit D (OSTEO BI-FLEX ONE PER DAY) TABS Take 1 tablet by mouth daily.    . Glucose Blood (ACCU-CHEK AVIVA PLUS VI) by In Vitro route.    Marland Kitchen JARDIANCE 10 MG TABS tablet TAKE 1 TABLET BY MOUTH DAILY (Patient taking differently: Take 10 mg by mouth daily. ) 30 tablet 2  . lisinopril (PRINIVIL,ZESTRIL) 10 MG tablet TAKE 1 TABLET(10 MG) BY MOUTH DAILY (Patient taking differently: Take 10 mg by mouth daily. ) 90 tablet 1  . loperamide (IMODIUM) 2 MG  capsule Take 1 capsule (2 mg total) by mouth See admin instructions. With onset of loose stool, take 4mg  followed by 2mg  every 2 hours until 12 hours have passed without loose bowel movement. Maximum: 16 mg/day 60 capsule 0  . metFORMIN (GLUCOPHAGE-XR) 500 MG 24 hr tablet Take 3 pills by mouth daily for 4-5 days, then increase to 4 pills daily (if tolerated) (Patient taking differently: Take 2,000 mg by mouth daily with breakfast. ) 360 tablet 0  . Multiple Vitamin (MULTIVITAMIN) tablet Take 1 tablet by mouth daily.    . Omega-3 Fatty Acids (FISH OIL) 1200 MG CAPS Take 1,200 mg by mouth daily.     . Omeprazole (PRILOSEC PO) Take 20 mg by mouth daily as needed (heart burn).     Marland Kitchen OZEMPIC, 0.25 OR 0.5 MG/DOSE, 2 MG/1.5ML SOPN  once a week.     . pioglitazone (ACTOS) 45 MG tablet TAKE 1 TABLET(45 MG) BY MOUTH DAILY (Patient taking differently: Take 22.5 mg by mouth daily. ) 90 tablet 1  . prochlorperazine (COMPAZINE) 10 MG tablet Take 1 tablet (10 mg total) by mouth every 6 (six) hours as needed for nausea or vomiting. 15 tablet 0  . rosuvastatin (CRESTOR) 10 MG tablet Take 1 tablet (10 mg total) by mouth at bedtime. 90 tablet 1  . amLODipine (NORVASC) 2.5 MG tablet Take 1 tablet (2.5 mg total) by mouth daily. (Patient not taking: Reported on 11/24/2019) 30 tablet 3  . clobetasol cream (TEMOVATE) 1.44 % Apply 1 application topically 2 (two) times daily. To affected skin; too strong for face, groin, underarms (Patient not taking: Reported on 07/17/2019) 30 g 2  . diclofenac Sodium (VOLTAREN) 1 % GEL Apply 2 g topically 4 (four) times daily. (Patient not taking: Reported on 11/24/2019) 100 g 1  . INLYTA 5 MG tablet TAKE ONE TABLET BY MOUTH TWICE A DAY (12 HOURS APART) WITH A FULL GLASS OF WATER. MAY TAKE WITH OR WITHOUT FOOD. AVOID GRAPEFRUIT PRODUCTS. (Patient not taking: Reported on 09/25/2019) 60 tablet 0   No current facility-administered medications for this visit.     PHYSICAL EXAMINATION: ECOG  PERFORMANCE STATUS: 0 - Asymptomatic Vitals:   11/24/19 0954  BP: 133/80  Pulse: 80  Resp: 18  Temp: (!) 96.9 F (36.1 C)   Filed Weights   11/24/19 0954  Weight: 217 lb 14.4 oz (98.8 kg)    Physical Exam Constitutional:      General: He is not in acute distress. HENT:     Head: Normocephalic and atraumatic.  Eyes:     General: No scleral icterus.    Pupils: Pupils are equal, round, and reactive to light.  Cardiovascular:     Rate and Rhythm: Normal rate and regular rhythm.     Heart sounds: Normal heart sounds.  Pulmonary:     Effort: Pulmonary effort is normal. No respiratory distress.     Breath sounds: No wheezing.  Abdominal:     General: Bowel sounds are normal. There is no distension.     Palpations: Abdomen is soft. There is no mass.     Tenderness: There is no abdominal tenderness.  Musculoskeletal:        General: No deformity. Normal range of motion.     Cervical back: Normal range of motion and neck supple.  Skin:    General: Skin is warm and dry.     Findings: No erythema or rash.  Neurological:     Mental Status: He is alert and oriented to person, place, and time. Mental status is at baseline.     Cranial Nerves: No cranial nerve deficit.     Coordination: Coordination normal.  Psychiatric:        Mood and Affect: Mood normal.     LABORATORY DATA:  I have reviewed the data as listed Lab Results  Component Value Date   WBC 7.0 11/24/2019   HGB 14.5 11/24/2019   HCT 42.9 11/24/2019   MCV 89.9 11/24/2019   PLT 192 11/24/2019   Recent Labs    09/22/19 1046 09/25/19 0812 10/03/19 0853 10/03/19 0853 10/21/19 0922 10/29/19 1118 10/29/19 1118 11/04/19 1035 11/13/19 1037 11/24/19 0920  NA  --    < > 137  --   --   --   --  139  --  137  K  --    < >  4.4  --   --   --   --  4.7  --  4.4  CL  --    < > 105  --   --   --   --  103  --  103  CO2  --    < > 25  --   --   --   --  26  --  26  GLUCOSE  --    < > 107*  --   --   --   --  133*  --   102*  BUN  --    < > 32*  --   --   --   --  21*  --  21*  CREATININE  --    < > 1.16  --   --   --   --  1.29*  --  1.14  CALCIUM  --    < > 9.1  --   --   --   --  9.4  --  9.2  GFRNONAA  --    < > >60  --   --   --   --  >60  --  >60  GFRAA  --    < > >60  --   --   --   --  >60  --  >60  PROT 6.5   < > 6.9   < > 6.5 7.0   < > 7.5 7.0 7.2  ALBUMIN 3.7   < > 4.0   < > 4.3 4.0   < > 4.3 4.0 4.2  AST 180*   < > 217*   < > 133* 80*   < > 49* 31 24  ALT 360*   < > 480*   < > 311* 166*   < > 104* 46* 27  ALKPHOS 57   < > 60   < > 69 55   < > 59 49 52  BILITOT 0.9   < > 0.8   < > 0.6 0.9   < > 0.9 0.9 0.8  BILIDIR 0.2  --   --   --  0.18 0.2  --   --  0.2  --   IBILI 0.7  --   --   --   --  0.7  --   --  0.7  --    < > = values in this interval not displayed.   Iron/TIBC/Ferritin/ %Sat    Component Value Date/Time   IRON 118 09/25/2019 1115   TIBC 454 (H) 09/25/2019 1115   FERRITIN 236 09/25/2019 1115   IRONPCTSAT 26 09/25/2019 1115      RADIOGRAPHIC STUDIES: I have personally reviewed the radiological images as listed and agreed with the findings in the report.  CT Chest W Contrast  Result Date: 09/12/2019 CLINICAL DATA:  Clear cell renal carcinoma. Pulmonary metastasis. Bladder mass. Patient status post RIGHT nephrectomy. EXAM: CT CHEST, ABDOMEN, AND PELVIS WITH CONTRAST TECHNIQUE: Multidetector CT imaging of the chest, abdomen and pelvis was performed following the standard protocol during bolus administration of intravenous contrast. CONTRAST:  188mL OMNIPAQUE IOHEXOL 300 MG/ML  SOLN COMPARISON:  Chest CT 05/28/2019, CT abdomen pelvis 04/30/2019 FINDINGS: CT CHEST FINDINGS Cardiovascular: No significant vascular findings. Normal heart size. No pericardial effusion. Mediastinum/Nodes: No axillary supraclavicular adenopathy. No mediastinal hilar adenopathy no pericardial effusion. Esophagus normal. Lungs/Pleura: Decrease in size of pulmonary nodules. For example: LEFT upper lobe nodule  measuring 2 mm (  image 72/series 3) decreased from 8 mm. Rounded nodule in the superior segment of the LEFT lower lobe previously measuring 6 mm has resolved. RIGHT middle lobe nodule measuring 3 mm (image 94/3) decreased from 10 mm. RIGHT lower lobe nodule resolved. No new pulmonary nodules. Musculoskeletal: No aggressive osseous lesion. CT ABDOMEN AND PELVIS FINDINGS Hepatobiliary: No focal hepatic lesion. No biliary ductal dilatation. Gallbladder is normal. Common bile duct is normal. Pancreas: Pancreas is normal. No ductal dilatation. No pancreatic inflammation. Spleen: Normal spleen Adrenals/urinary tract: RIGHT adrenal gland normal. Small focus of postsurgical tissue in the RIGHT nephrectomy bed (image 71/2) is unchanged and favored benign postsurgical granulation/scarring. No new nodularity. LEFT adrenal gland normal. High-density cyst in the cortex of the LEFT kidney measures 25 mm (image 34/2) is unchanged from 25 mm. This lesion previously characterized on CT 06/21/2018 as nonenhancing. Adjacent simple fluid attenuation cyst measuring 16 mm (image 76/2) is unchanged. No new lesion in the LEFT kidney. LEFT ureter normal. No enhancing lesion in the bladder. Interval resection of RIGHT bladder wall tumor. Stomach/Bowel: Stomach, small bowel, appendix, and cecum are normal. The colon and rectosigmoid colon are normal. Vascular/Lymphatic: Abdominal aorta is normal caliber with atherosclerotic calcification. There is no retroperitoneal or periportal lymphadenopathy. No pelvic lymphadenopathy. Reproductive: Prostate normal Other: No free fluid. Musculoskeletal: No aggressive osseous lesion IMPRESSION: Chest Impression: 1. Bilateral pulmonary nodules decreased significantly size. Several nodules have resolved completely. 2. No new pulmonary nodules. Abdomen / Pelvis Impression: 1. Bosniak 1 and Bosniak 2 benign cysts of the LEFT kidney. No interval change. 2. Stable RIGHT nephrectomy bed with mild scarring. 3.  Interval resection of RIGHT bladder wall tumor. No enhancing lesion the bladder. 4. No evidence of metastatic disease. Electronically Signed   By: Suzy Bouchard M.D.   On: 09/12/2019 09:50   CT Abdomen Pelvis W Contrast  Result Date: 09/12/2019 CLINICAL DATA:  Clear cell renal carcinoma. Pulmonary metastasis. Bladder mass. Patient status post RIGHT nephrectomy. EXAM: CT CHEST, ABDOMEN, AND PELVIS WITH CONTRAST TECHNIQUE: Multidetector CT imaging of the chest, abdomen and pelvis was performed following the standard protocol during bolus administration of intravenous contrast. CONTRAST:  119mL OMNIPAQUE IOHEXOL 300 MG/ML  SOLN COMPARISON:  Chest CT 05/28/2019, CT abdomen pelvis 04/30/2019 FINDINGS: CT CHEST FINDINGS Cardiovascular: No significant vascular findings. Normal heart size. No pericardial effusion. Mediastinum/Nodes: No axillary supraclavicular adenopathy. No mediastinal hilar adenopathy no pericardial effusion. Esophagus normal. Lungs/Pleura: Decrease in size of pulmonary nodules. For example: LEFT upper lobe nodule measuring 2 mm (image 72/series 3) decreased from 8 mm. Rounded nodule in the superior segment of the LEFT lower lobe previously measuring 6 mm has resolved. RIGHT middle lobe nodule measuring 3 mm (image 94/3) decreased from 10 mm. RIGHT lower lobe nodule resolved. No new pulmonary nodules. Musculoskeletal: No aggressive osseous lesion. CT ABDOMEN AND PELVIS FINDINGS Hepatobiliary: No focal hepatic lesion. No biliary ductal dilatation. Gallbladder is normal. Common bile duct is normal. Pancreas: Pancreas is normal. No ductal dilatation. No pancreatic inflammation. Spleen: Normal spleen Adrenals/urinary tract: RIGHT adrenal gland normal. Small focus of postsurgical tissue in the RIGHT nephrectomy bed (image 71/2) is unchanged and favored benign postsurgical granulation/scarring. No new nodularity. LEFT adrenal gland normal. High-density cyst in the cortex of the LEFT kidney measures 25 mm  (image 34/2) is unchanged from 25 mm. This lesion previously characterized on CT 06/21/2018 as nonenhancing. Adjacent simple fluid attenuation cyst measuring 16 mm (image 76/2) is unchanged. No new lesion in the LEFT kidney. LEFT ureter normal. No enhancing  lesion in the bladder. Interval resection of RIGHT bladder wall tumor. Stomach/Bowel: Stomach, small bowel, appendix, and cecum are normal. The colon and rectosigmoid colon are normal. Vascular/Lymphatic: Abdominal aorta is normal caliber with atherosclerotic calcification. There is no retroperitoneal or periportal lymphadenopathy. No pelvic lymphadenopathy. Reproductive: Prostate normal Other: No free fluid. Musculoskeletal: No aggressive osseous lesion IMPRESSION: Chest Impression: 1. Bilateral pulmonary nodules decreased significantly size. Several nodules have resolved completely. 2. No new pulmonary nodules. Abdomen / Pelvis Impression: 1. Bosniak 1 and Bosniak 2 benign cysts of the LEFT kidney. No interval change. 2. Stable RIGHT nephrectomy bed with mild scarring. 3. Interval resection of RIGHT bladder wall tumor. No enhancing lesion the bladder. 4. No evidence of metastatic disease. Electronically Signed   By: Suzy Bouchard M.D.   On: 09/12/2019 09:50   US LIVER DOPPLER  Result Date: 10/03/2019 CLINICAL DATA:  Abnormal LFTs. EXAM: LIMITED ABDOMINAL ULTRASOUND US HEPATIC DOPPLER ARTERIAL/VENOUS ULTRASOUND COMPARISON:  CT 09/12/2019 FINDINGS: Gallbladder: Physiologically distended without stones, wall thickening, or pericholecystic fluid. Sonographer reports no sonographic Murphy's sign. Common bile duct:  Normal in caliber, 2.34mm diameter. Liver: Homogeneous in echotexture without focal lesion or intrahepatic bile duct dilatation. Portal Vein 1.2 cm diameter. No occlusion or thrombus. Velocities (all hepatopetal): Main:  41-49 cm/sec Right: 26 cm/sec Left:  24 cm/sec Hepatic Vein Velocities (all hepatofugal): Right:  22 cm/sec Middle:  25 cm/sec  Left:  12 cm/sec Hepatic Artery Velocity: 79 cm/sec IVC:  Patent.  Velocity 27 cm/sec Spleen:  No focal lesion, 5.3 x 9.6 x 3.5 cm (93 cc). Splenic Vein: No occlusion or thrombus.  Velocity: 18 cm/sec Varices: None seen Ascites: Absent IMPRESSION: 1. Unremarkable hepatic vascular Doppler evaluation. 2. Normal gallbladder. Electronically Signed   By: Lucrezia Europe M.D.   On: 10/03/2019 11:16   US Abdomen Limited RUQ  Result Date: 10/03/2019 CLINICAL DATA:  Abnormal LFTs. EXAM: LIMITED ABDOMINAL ULTRASOUND US HEPATIC DOPPLER ARTERIAL/VENOUS ULTRASOUND COMPARISON:  CT 09/12/2019 FINDINGS: Gallbladder: Physiologically distended without stones, wall thickening, or pericholecystic fluid. Sonographer reports no sonographic Murphy's sign. Common bile duct:  Normal in caliber, 2.6mm diameter. Liver: Homogeneous in echotexture without focal lesion or intrahepatic bile duct dilatation. Portal Vein 1.2 cm diameter. No occlusion or thrombus. Velocities (all hepatopetal): Main:  41-49 cm/sec Right: 26 cm/sec Left:  24 cm/sec Hepatic Vein Velocities (all hepatofugal): Right:  22 cm/sec Middle:  25 cm/sec Left:  12 cm/sec Hepatic Artery Velocity: 79 cm/sec IVC:  Patent.  Velocity 27 cm/sec Spleen:  No focal lesion, 5.3 x 9.6 x 3.5 cm (93 cc). Splenic Vein: No occlusion or thrombus.  Velocity: 18 cm/sec Varices: None seen Ascites: Absent IMPRESSION: 1. Unremarkable hepatic vascular Doppler evaluation. 2. Normal gallbladder. Electronically Signed   By: Lucrezia Europe M.D.   On: 10/03/2019 11:16      ASSESSMENT & PLAN:  1. Clear cell carcinoma of right kidney ([place])   2. Transaminitis   3. 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 Axitinib Keytruda have been on hold due to liver toxicities -transaminitis. Labs reviewed and discussed with patient. Transaminitis has completely resolved and normalized. Recommend patient to resume Keytruda  treatments today.  Monitor LFT in 10 days.  #Transaminitis, likely immunotherapy/axitinib induced. Counts has normalized.  Resume Keytruda treatment today.  Monitor counts.  Hypertension, blood pressure is well controlled currently.   We spent sufficient time to discuss many aspect of care, questions were answered to patient's satisfaction. All  questions were answered. The patient knows to call the clinic with any problems questions or concerns.  Return of visit: Repeat LFT 10 days.  Follow-up in 3 weeks.  Earlie Server, MD, PhD Hematology Oncology [facility] Pager- [phone] 11/24/2019

## 2019-11-24 NOTE — Progress Notes (Signed)
Pt here for follow up. No new concerns voiced.   

## 2019-12-04 ENCOUNTER — Inpatient Hospital Stay: Payer: BC Managed Care – PPO

## 2019-12-04 ENCOUNTER — Other Ambulatory Visit: Payer: Self-pay

## 2019-12-04 DIAGNOSIS — C641 Malignant neoplasm of right kidney, except renal pelvis: Secondary | ICD-10-CM

## 2019-12-04 DIAGNOSIS — Z5112 Encounter for antineoplastic immunotherapy: Secondary | ICD-10-CM | POA: Diagnosis not present

## 2019-12-04 DIAGNOSIS — R7401 Elevation of levels of liver transaminase levels: Secondary | ICD-10-CM

## 2019-12-04 LAB — HEPATIC FUNCTION PANEL
ALT: 24 U/L (ref 0–44)
AST: 28 U/L (ref 15–41)
Albumin: 4.3 g/dL (ref 3.5–5.0)
Alkaline Phosphatase: 44 U/L (ref 38–126)
Bilirubin, Direct: 0.1 mg/dL (ref 0.0–0.2)
Indirect Bilirubin: 0.9 mg/dL (ref 0.3–0.9)
Total Bilirubin: 1 mg/dL (ref 0.3–1.2)
Total Protein: 7.1 g/dL (ref 6.5–8.1)

## 2019-12-15 ENCOUNTER — Inpatient Hospital Stay (HOSPITAL_BASED_OUTPATIENT_CLINIC_OR_DEPARTMENT_OTHER): Payer: BC Managed Care – PPO | Admitting: Oncology

## 2019-12-15 ENCOUNTER — Inpatient Hospital Stay: Payer: BC Managed Care – PPO

## 2019-12-15 ENCOUNTER — Other Ambulatory Visit: Payer: Self-pay

## 2019-12-15 ENCOUNTER — Inpatient Hospital Stay: Payer: BC Managed Care – PPO | Attending: Oncology

## 2019-12-15 ENCOUNTER — Encounter: Payer: Self-pay | Admitting: Oncology

## 2019-12-15 VITALS — BP 141/91 | HR 83 | Temp 96.6°F | Resp 18 | Wt 214.8 lb

## 2019-12-15 DIAGNOSIS — C641 Malignant neoplasm of right kidney, except renal pelvis: Secondary | ICD-10-CM | POA: Insufficient documentation

## 2019-12-15 DIAGNOSIS — Z5112 Encounter for antineoplastic immunotherapy: Secondary | ICD-10-CM

## 2019-12-15 DIAGNOSIS — E785 Hyperlipidemia, unspecified: Secondary | ICD-10-CM | POA: Diagnosis not present

## 2019-12-15 DIAGNOSIS — R7401 Elevation of levels of liver transaminase levels: Secondary | ICD-10-CM

## 2019-12-15 DIAGNOSIS — Z87891 Personal history of nicotine dependence: Secondary | ICD-10-CM | POA: Diagnosis not present

## 2019-12-15 DIAGNOSIS — Z79899 Other long term (current) drug therapy: Secondary | ICD-10-CM | POA: Insufficient documentation

## 2019-12-15 DIAGNOSIS — I10 Essential (primary) hypertension: Secondary | ICD-10-CM | POA: Insufficient documentation

## 2019-12-15 DIAGNOSIS — K219 Gastro-esophageal reflux disease without esophagitis: Secondary | ICD-10-CM | POA: Diagnosis not present

## 2019-12-15 DIAGNOSIS — E119 Type 2 diabetes mellitus without complications: Secondary | ICD-10-CM | POA: Insufficient documentation

## 2019-12-15 LAB — COMPREHENSIVE METABOLIC PANEL
ALT: 24 U/L (ref 0–44)
AST: 23 U/L (ref 15–41)
Albumin: 4.4 g/dL (ref 3.5–5.0)
Alkaline Phosphatase: 52 U/L (ref 38–126)
Anion gap: 8 (ref 5–15)
BUN: 23 mg/dL — ABNORMAL HIGH (ref 6–20)
CO2: 27 mmol/L (ref 22–32)
Calcium: 9.3 mg/dL (ref 8.9–10.3)
Chloride: 102 mmol/L (ref 98–111)
Creatinine, Ser: 1.1 mg/dL (ref 0.61–1.24)
GFR calc Af Amer: >60 mL/min (ref >60)
GFR calc non Af Amer: >60 mL/min (ref >60)
Glucose, Bld: 109 mg/dL — ABNORMAL HIGH (ref 70–99)
Potassium: 4.2 mmol/L (ref 3.5–5.1)
Sodium: 137 mmol/L (ref 135–145)
Total Bilirubin: 0.8 mg/dL (ref 0.3–1.2)
Total Protein: 7.1 g/dL (ref 6.5–8.1)

## 2019-12-15 LAB — CBC WITH DIFFERENTIAL/PLATELET
Abs Immature Granulocytes: 0.04 10*3/uL (ref 0.00–0.07)
Basophils Absolute: 0 10*3/uL (ref 0.0–0.1)
Basophils Relative: 1 %
Eosinophils Absolute: 0.2 10*3/uL (ref 0.0–0.5)
Eosinophils Relative: 3 %
HCT: 44.3 % (ref 39.0–52.0)
Hemoglobin: 14.8 g/dL (ref 13.0–17.0)
Immature Granulocytes: 1 %
Lymphocytes Relative: 29 %
Lymphs Abs: 2.3 10*3/uL (ref 0.7–4.0)
MCH: 30.6 pg (ref 26.0–34.0)
MCHC: 33.4 g/dL (ref 30.0–36.0)
MCV: 91.5 fL (ref 80.0–100.0)
Monocytes Absolute: 0.6 10*3/uL (ref 0.1–1.0)
Monocytes Relative: 7 %
Neutro Abs: 4.6 10*3/uL (ref 1.7–7.7)
Neutrophils Relative %: 59 %
Platelets: 214 10*3/uL (ref 150–400)
RBC: 4.84 MIL/uL (ref 4.22–5.81)
RDW: 12.7 % (ref 11.5–15.5)
WBC: 7.7 10*3/uL (ref 4.0–10.5)
nRBC: 0 % (ref 0.0–0.2)

## 2019-12-15 LAB — TSH: TSH: 0.872 u[IU]/mL (ref 0.350–4.500)

## 2019-12-15 MED ORDER — AXITINIB 1 MG PO TABS: 3.0000 mg | ORAL_TABLET | Freq: Two times a day (BID) | ORAL | 0 refills | Status: DC

## 2019-12-15 MED ORDER — SODIUM CHLORIDE 0.9 % IV SOLN
Freq: Once | INTRAVENOUS | Status: AC
  Filled 2019-12-15: qty 250

## 2019-12-15 MED ORDER — SODIUM CHLORIDE 0.9 % IV SOLN
200.0000 mg | Freq: Once | INTRAVENOUS | Status: AC
  Administered 2019-12-15: 200 mg via INTRAVENOUS
  Filled 2019-12-15: qty 8

## 2019-12-15 NOTE — Progress Notes (Signed)
Hematology/Oncology follow up note [facility] Telephone:([phone] [phone]   Patient Care Team: [name], NP as PCP - General [name], [name], MD as PCP - Family Medicine (Family Medicine) [name], MD as Consulting Physician (General Surgery) [name], [name], MD as Attending Physician (Family Medicine) [name], [name], MD as Consulting Physician (Dermatology)  REFERRING PROVIDER: [name], NP  CHIEF COMPLAINTS/REASON FOR VISIT:  Follow-up of kidney cancer  HISTORY OF PRESENTING ILLNESS:   Matthew Woodard is a  54 y.o.  male with PMH listed below was seen in consultation at the request of  [name], NP  for evaluation of kidney cancer Patient's cancer history dated back to February 2020 when he developed gross hematuria with small clots and right-sided flank pain.  Patient had a CT urogram done which showed right 4 cm central enhancing renal mass with invasion into the collecting system.  X-ray of chest was performed to see complete staging which showed no concerning findings. 06/28/2018 patient underwent a cystoscopy, bladder biopsy and a right retrograde pyelogram with intraoperative interpretation, right diagnostic ureteroscopy, right renal pelvis biopsy, right ureteral stent placement.  biopsy showed atypical cell clusters, with extensive crush artifact, nondiagnostic.   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  Surgical margins negative for tumor.pT3a,Nx Patient has been on surveillance after surgery. 10/28/2018 CT abdomen pelvis with contrast showed minimal fluid and or postoperative changes within the right renal fossa.  No evidence of metastatic disease or recurrence.  Patient has left kidney lesion previously characterized as  nonenhancing cyst by multi phasic contrast-enhanced CT. Patient reports an episode of gross hematuria in early December 2020, patient is due for 63-month surveillance CT scan. 04/30/2019 CT abdomen pelvis with contrast showed high attenuation mass at the right uretero vesicle junction, with extension into the bladder.  Bilateral pulmonary nodules, most indicated for metastatic disease.   Patient underwent cystoscopy and transurethral resection of irregular bladder tumor 3 cm.  Mass appeared to emanate from the right ureteral orifice.  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 Patient was referred to me for further work-up and management.  #Staging chest CT with contrast showed numerous bilateral pulmonary nodules.  Compatible with metastatic disease. Bone scan showed no osseous metastatic disease.   #History of tobacco abuse, former smoker.  Quit in 2015.  He denies any hemoptysis, chest pain, shortness of breath today. Patient reports that hematuria has completely resolved.  Denies any pain today. He is accompanied by his wife.  #History of cutaneous psoriasis, mostly on his right hand, currently on weekly methotrexate 12.5 mg with good symptom control. #NGS: [facility] PD L1 TPS 1%  # patient was referred to ENT for evaluation of headache and sinus pressure.  He had a ENT evaluation and feels that patient does not have sinusitis.  [name] recommended MRI brain to rule out PRES syndrome.  Axitinib has been held MRI brain was done and was negative.   #  off axitinib and Keytruda since the beginning of May due to transaminitis. Patient was seen and evaluated by gastroenterology [name]. Work-up was negative. Ultrasound liver vascular Doppler is negative. Recommend observation.  INTERVAL HISTORY Matthew Woodard is a 55 y.o. male who has above history reviewed by me today presents for follow up visit for management of metastatic  RCC. Problems  and complaints are listed below: Patient has no new complaints.  Denies any blood in his urine.  Denies any pain, nausea, vomiting.    Review of Systems  Constitutional: Negative for appetite change, chills, fatigue, fever and unexpected weight change.  HENT:   Negative for hearing loss and voice change.   Eyes: Negative for eye problems and icterus.  Respiratory: Negative for chest tightness, cough and shortness of breath.   Cardiovascular: Negative for chest pain and leg swelling.  Gastrointestinal: Negative for abdominal distention, abdominal pain, blood in stool and nausea.  Endocrine: Negative for hot flashes.  Genitourinary: Negative for difficulty urinating, dysuria and frequency.   Musculoskeletal: Negative for arthralgias.  Skin: Negative for itching and rash.  Neurological: Negative for extremity weakness, headaches, light-headedness and numbness.  Hematological: Negative for adenopathy. Does not bruise/bleed easily.  Psychiatric/Behavioral: Negative for confusion.    MEDICAL HISTORY:  Past Medical History:  Diagnosis Date  . Arthritis    hands, left knee  . Cancer ([place])   . Diabetes mellitus without complication ([place])   . Eczema 01/08/2017  . GERD (gastroesophageal reflux disease)   . History of kidney cancer   . Hyperlipidemia LDL goal <100 11/02/2014  . Hypertension   . Kidney stone    YEAR H/O HEMATURIA  . Psoriasis   . Wears dentures    full upper, partial lower.  Has, does not wear    SURGICAL HISTORY: Past Surgical History:  Procedure Laterality Date  . COLONOSCOPY WITH PROPOFOL N/A 02/23/2017   Procedure: COLONOSCOPY WITH PROPOFOL;  Surgeon: [name], MD;  Location: [place];  Service: Endoscopy;  Laterality: N/A;  Diabetic - oral meds  . CYST EXCISION  Aug. 2016   on back  . CYSTOSCOPY W/ RETROGRADES Right 06/28/2018   Procedure: CYSTOSCOPY WITH RETROGRADE PYELOGRAM;  Surgeon: Billey Co, MD;  Location: [facility];  Service: Urology;   Laterality: Right;  . CYSTOSCOPY W/ URETERAL STENT REMOVAL Right 07/22/2018   Procedure: CYSTOSCOPY WITH STENT REMOVAL;  Surgeon: Billey Co, MD;  Location: [facility];  Service: Urology;  Laterality: Right;  . CYSTOSCOPY WITH BIOPSY Right 06/28/2018   Procedure: CYSTOSCOPY WITH RENAL BIOPSY;  Surgeon: Billey Co, MD;  Location: [facility];  Service: Urology;  Laterality: Right;  . CYSTOSCOPY WITH URETEROSCOPY AND STENT PLACEMENT Right 06/28/2018   Procedure: CYSTOSCOPY WITH URETEROSCOPY AND STENT PLACEMENT;  Surgeon: Billey Co, MD;  Location: [facility];  Service: Urology;  Laterality: Right;  . FULGURATION OF BLADDER TUMOR N/A 06/28/2018   Procedure: CYSTOSCOPY BLADDER BIOPSY, FULGERATION OF BLADDER;  Surgeon: Billey Co, MD;  Location: [facility];  Service: Urology;  Laterality: N/A;  . KNEE SURGERY Left   . LAPAROSCOPIC NEPHRECTOMY, HAND ASSISTED Right 07/22/2018   Procedure: HAND ASSISTED LAPAROSCOPIC NEPHRECTOMY;  Surgeon: Billey Co, MD;  Location: [facility];  Service: Urology;  Laterality: Right;  . NASAL SINUS SURGERY  03/15  . POLYPECTOMY N/A 02/23/2017   Procedure: POLYPECTOMY;  Surgeon: [name], MD;  Location: [place];  Service: Endoscopy;  Laterality: N/A;  . tooth abstraction    . TRANSURETHRAL RESECTION OF BLADDER TUMOR N/A 05/12/2019   Procedure: TRANSURETHRAL RESECTION OF BLADDER TUMOR (TURBT);  Surgeon: Billey Co, MD;  Location: [facility];  Service: Urology;  Laterality: N/A;  . VARICOCELE EXCISION      SOCIAL HISTORY: Social History   Socioeconomic History  . Marital status: Married    Spouse name: Not on  file  . Number of children: Not on file  . Years of education: Not on file  . Highest education level: Not on file  Occupational History  . Not on file  Tobacco Use  . Smoking status: Former Smoker    Types: Cigarettes    Quit date: 06/15/2013    Years since quitting: 6.5  . Smokeless tobacco: Never Used  Vaping Use  .  Vaping Use: Never used  Substance and Sexual Activity  . Alcohol use: No    Alcohol/week: 0.0 standard drinks  . Drug use: No  . Sexual activity: Yes  Other Topics Concern  . Not on file  Social History Narrative  . Not on file   Social Determinants of Health   Financial Resource Strain:   . Difficulty of Paying Living Expenses:   Food Insecurity:   . Worried About Charity fundraiser in the Last Year:   . Arboriculturist in the Last Year:   Transportation Needs:   . Film/video editor (Medical):   Marland Kitchen Lack of Transportation (Non-Medical):   Physical Activity:   . Days of Exercise per Week:   . Minutes of Exercise per Session:   Stress:   . Feeling of Stress :   Social Connections:   . Frequency of Communication with Friends and Family:   . Frequency of Social Gatherings with Friends and Family:   . Attends Religious Services:   . Active Member of Clubs or Organizations:   . Attends Archivist Meetings:   Marland Kitchen Marital Status:   Intimate Partner Violence:   . Fear of Current or Ex-Partner:   . Emotionally Abused:   Marland Kitchen Physically Abused:   . Sexually Abused:     FAMILY HISTORY: Family History  Problem Relation Age of Onset  . Heart disease Father   . Hypertension Father   . COPD Father   . Colon cancer Maternal Grandmother   . Heart attack Paternal Grandfather     ALLERGIES:  is allergic to glucotrol [glipizide].  MEDICATIONS:  Current Outpatient Medications  Medication Sig Dispense Refill  . amLODipine (NORVASC) 2.5 MG tablet Take 1 tablet (2.5 mg total) by mouth daily. (Patient not taking: Reported on 11/24/2019) 30 tablet 3  . aspirin EC 81 MG tablet Take 81 mg by mouth daily.    . [place]-Glucosamine-Vit D (OSTEO BI-FLEX ONE PER DAY) TABS Take 1 tablet by mouth daily.    . clobetasol cream (TEMOVATE) 5.63 % Apply 1 application topically 2 (two) times daily. To affected skin; too strong for face, groin, underarms (Patient not taking: Reported on  07/17/2019) 30 g 2  . diclofenac Sodium (VOLTAREN) 1 % GEL Apply 2 g topically 4 (four) times daily. (Patient not taking: Reported on 11/24/2019) 100 g 1  . Glucose Blood (ACCU-CHEK AVIVA PLUS VI) by In Vitro route.    . INLYTA 5 MG tablet TAKE ONE TABLET BY MOUTH TWICE A DAY (12 HOURS APART) WITH A FULL GLASS OF WATER. MAY TAKE WITH OR WITHOUT FOOD. AVOID GRAPEFRUIT PRODUCTS. (Patient not taking: Reported on 09/25/2019) 60 tablet 0  . JARDIANCE 10 MG TABS tablet TAKE 1 TABLET BY MOUTH DAILY (Patient taking differently: Take 10 mg by mouth daily. ) 30 tablet 2  . lisinopril (PRINIVIL,ZESTRIL) 10 MG tablet TAKE 1 TABLET(10 MG) BY MOUTH DAILY (Patient taking differently: Take 10 mg by mouth daily. ) 90 tablet 1  . loperamide (IMODIUM) 2 MG capsule Take 1 capsule (2 mg total)  by mouth See admin instructions. With onset of loose stool, take 4mg  followed by 2mg  every 2 hours until 12 hours have passed without loose bowel movement. Maximum: 16 mg/day 60 capsule 0  . metFORMIN (GLUCOPHAGE-XR) 500 MG 24 hr tablet Take 3 pills by mouth daily for 4-5 days, then increase to 4 pills daily (if tolerated) (Patient taking differently: Take 2,000 mg by mouth daily with breakfast. ) 360 tablet 0  . Multiple Vitamin (MULTIVITAMIN) tablet Take 1 tablet by mouth daily.    . Omega-3 Fatty Acids (FISH OIL) 1200 MG CAPS Take 1,200 mg by mouth daily.     . Omeprazole (PRILOSEC PO) Take 20 mg by mouth daily as needed (heart burn).     Marland Kitchen OZEMPIC, 0.25 OR 0.5 MG/DOSE, 2 MG/1.5ML SOPN once a week.     . pioglitazone (ACTOS) 45 MG tablet TAKE 1 TABLET(45 MG) BY MOUTH DAILY (Patient taking differently: Take 22.5 mg by mouth daily. ) 90 tablet 1  . prochlorperazine (COMPAZINE) 10 MG tablet Take 1 tablet (10 mg total) by mouth every 6 (six) hours as needed for nausea or vomiting. 15 tablet 0  . rosuvastatin (CRESTOR) 10 MG tablet Take 1 tablet (10 mg total) by mouth at bedtime. 90 tablet 1   No current facility-administered medications  for this visit.     PHYSICAL EXAMINATION: ECOG PERFORMANCE STATUS: 0 - Asymptomatic Vitals:   12/15/19 0851  BP: (!) 141/91  Pulse: 83  Resp: 18  Temp: (!) 96.6 F (35.9 C)   There were no vitals filed for this visit.  Physical Exam Constitutional:      General: He is not in acute distress. HENT:     Head: Normocephalic and atraumatic.  Eyes:     General: No scleral icterus.    Pupils: Pupils are equal, round, and reactive to light.  Cardiovascular:     Rate and Rhythm: Normal rate and regular rhythm.     Heart sounds: Normal heart sounds.  Pulmonary:     Effort: Pulmonary effort is normal. No respiratory distress.     Breath sounds: No wheezing.  Abdominal:     General: Bowel sounds are normal. There is no distension.     Palpations: Abdomen is soft. There is no mass.     Tenderness: There is no abdominal tenderness.  Musculoskeletal:        General: No deformity. Normal range of motion.     Cervical back: Normal range of motion and neck supple.  Skin:    General: Skin is warm and dry.     Findings: No erythema or rash.  Neurological:     Mental Status: He is alert and oriented to person, place, and time. Mental status is at baseline.     Cranial Nerves: No cranial nerve deficit.     Coordination: Coordination normal.  Psychiatric:        Mood and Affect: Mood normal.     LABORATORY DATA:  I have reviewed the data as listed Lab Results  Component Value Date   WBC 7.0 11/24/2019   HGB 14.5 11/24/2019   HCT 42.9 11/24/2019   MCV 89.9 11/24/2019   PLT 192 11/24/2019   Recent Labs    10/03/19 0853 10/21/19 0922 10/29/19 1118 10/29/19 1118 11/04/19 1035 11/04/19 1035 11/13/19 1037 11/24/19 0920 12/04/19 1117  NA 137  --   --   --  139  --   --  137  --   K 4.4  --   --   --  4.7  --   --  4.4  --   CL 105  --   --   --  103  --   --  103  --   CO2 25  --   --   --  26  --   --  26  --   GLUCOSE 107*  --   --   --  133*  --   --  102*  --   BUN  32*  --   --   --  21*  --   --  21*  --   CREATININE 1.16  --   --   --  1.29*  --   --  1.14  --   CALCIUM 9.1  --   --   --  9.4  --   --  9.2  --   GFRNONAA >60  --   --   --  >60  --   --  >60  --   GFRAA >60  --   --   --  >60  --   --  >60  --   PROT 6.9   < > 7.0   < > 7.5   < > 7.0 7.2 7.1  ALBUMIN 4.0   < > 4.0   < > 4.3   < > 4.0 4.2 4.3  AST 217*   < > 80*   < > 49*   < > 31 24 28   ALT 480*   < > 166*   < > 104*   < > 46* 27 24  ALKPHOS 60   < > 55   < > 59   < > 49 52 44  BILITOT 0.8   < > 0.9   < > 0.9   < > 0.9 0.8 1.0  BILIDIR  --    < > 0.2  --   --   --  0.2  --  0.1  IBILI  --   --  0.7  --   --   --  0.7  --  0.9   < > = values in this interval not displayed.   Iron/TIBC/Ferritin/ %Sat    Component Value Date/Time   IRON 118 09/25/2019 1115   TIBC 454 (H) 09/25/2019 1115   FERRITIN 236 09/25/2019 1115   IRONPCTSAT 26 09/25/2019 1115      RADIOGRAPHIC STUDIES: I have personally reviewed the radiological images as listed and agreed with the findings in the report.  US LIVER DOPPLER  Result Date: 10/03/2019 CLINICAL DATA:  Abnormal LFTs. EXAM: LIMITED ABDOMINAL ULTRASOUND US HEPATIC DOPPLER ARTERIAL/VENOUS ULTRASOUND COMPARISON:  CT 09/12/2019 FINDINGS: Gallbladder: Physiologically distended without stones, wall thickening, or pericholecystic fluid. Sonographer reports no sonographic Murphy's sign. Common bile duct:  Normal in caliber, 2.36mm diameter. Liver: Homogeneous in echotexture without focal lesion or intrahepatic bile duct dilatation. Portal Vein 1.2 cm diameter. No occlusion or thrombus. Velocities (all hepatopetal): Main:  41-49 cm/sec Right: 26 cm/sec Left:  24 cm/sec Hepatic Vein Velocities (all hepatofugal): Right:  22 cm/sec Middle:  25 cm/sec Left:  12 cm/sec Hepatic Artery Velocity: 79 cm/sec IVC:  Patent.  Velocity 27 cm/sec Spleen:  No focal lesion, 5.3 x 9.6 x 3.5 cm (93 cc). Splenic Vein: No occlusion or thrombus.  Velocity: 18 cm/sec Varices: None  seen Ascites: Absent IMPRESSION: 1. Unremarkable hepatic vascular Doppler evaluation. 2. Normal gallbladder. Electronically Signed   By: Lucrezia Europe M.D.   On:  10/03/2019 11:16   US Abdomen Limited RUQ  Result Date: 10/03/2019 CLINICAL DATA:  Abnormal LFTs. EXAM: LIMITED ABDOMINAL ULTRASOUND US HEPATIC DOPPLER ARTERIAL/VENOUS ULTRASOUND COMPARISON:  CT 09/12/2019 FINDINGS: Gallbladder: Physiologically distended without stones, wall thickening, or pericholecystic fluid. Sonographer reports no sonographic Murphy's sign. Common bile duct:  Normal in caliber, 2.22mm diameter. Liver: Homogeneous in echotexture without focal lesion or intrahepatic bile duct dilatation. Portal Vein 1.2 cm diameter. No occlusion or thrombus. Velocities (all hepatopetal): Main:  41-49 cm/sec Right: 26 cm/sec Left:  24 cm/sec Hepatic Vein Velocities (all hepatofugal): Right:  22 cm/sec Middle:  25 cm/sec Left:  12 cm/sec Hepatic Artery Velocity: 79 cm/sec IVC:  Patent.  Velocity 27 cm/sec Spleen:  No focal lesion, 5.3 x 9.6 x 3.5 cm (93 cc). Splenic Vein: No occlusion or thrombus.  Velocity: 18 cm/sec Varices: None seen Ascites: Absent IMPRESSION: 1. Unremarkable hepatic vascular Doppler evaluation. 2. Normal gallbladder. Electronically Signed   By: Lucrezia Europe M.D.   On: 10/03/2019 11:16      ASSESSMENT & PLAN:  1. Clear cell carcinoma of right kidney ([place])   2. Encounter for antineoplastic immunotherapy   3. 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 Axitinib has been on hold due to liver toxicities -transaminitis. Labs are reviewed and discussed with patient.  Transaminitis has completely resolved and normalized. Proceed with Keytruda treatment today. Monitor LFT in 10 days. Blood pressure is stable. If his liver function remains stable, plan to start axitinib at a lower dose of 3 mg twice daily for the next visit. We spent sufficient time to discuss many aspect of  care, questions were answered to patient's satisfaction. All questions were answered. The patient knows to call the clinic with any problems questions or concerns.  Return of visit: Repeat LFT 10 days.  Follow-up in 3 weeks.  Earlie Server, MD, PhD Hematology Oncology [facility] Pager- [phone] 12/15/2019

## 2019-12-15 NOTE — Progress Notes (Signed)
Patient here for follow up. No new concerns voiced.  °

## 2019-12-16 ENCOUNTER — Other Ambulatory Visit: Payer: Self-pay | Admitting: Pharmacist

## 2019-12-16 DIAGNOSIS — C641 Malignant neoplasm of right kidney, except renal pelvis: Secondary | ICD-10-CM

## 2019-12-16 MED ORDER — AXITINIB 1 MG PO TABS: 3.0000 mg | ORAL_TABLET | Freq: Two times a day (BID) | ORAL | 0 refills | Status: DC

## 2019-12-21 ENCOUNTER — Encounter: Payer: Self-pay | Admitting: Oncology

## 2019-12-22 ENCOUNTER — Other Ambulatory Visit: Payer: Self-pay

## 2019-12-22 DIAGNOSIS — R739 Hyperglycemia, unspecified: Secondary | ICD-10-CM

## 2019-12-22 NOTE — Telephone Encounter (Signed)
Order entered.  Will send results to :  Judieth Keens, MD-endocrinologist  [phone]  (phone) [phone])      Danae Orleans, MD-PCP   [phone]  (phone) [phone] (Fax)

## 2019-12-25 ENCOUNTER — Inpatient Hospital Stay: Payer: BC Managed Care – PPO

## 2019-12-25 ENCOUNTER — Other Ambulatory Visit: Payer: Self-pay

## 2019-12-25 DIAGNOSIS — C641 Malignant neoplasm of right kidney, except renal pelvis: Secondary | ICD-10-CM

## 2019-12-25 DIAGNOSIS — Z5112 Encounter for antineoplastic immunotherapy: Secondary | ICD-10-CM | POA: Diagnosis not present

## 2019-12-25 DIAGNOSIS — R739 Hyperglycemia, unspecified: Secondary | ICD-10-CM

## 2019-12-25 LAB — COMPREHENSIVE METABOLIC PANEL
ALT: 22 U/L (ref 0–44)
AST: 24 U/L (ref 15–41)
Albumin: 4 g/dL (ref 3.5–5.0)
Alkaline Phosphatase: 46 U/L (ref 38–126)
Anion gap: 6 (ref 5–15)
BUN: 24 mg/dL — ABNORMAL HIGH (ref 6–20)
CO2: 24 mmol/L (ref 22–32)
Calcium: 8.8 mg/dL — ABNORMAL LOW (ref 8.9–10.3)
Chloride: 104 mmol/L (ref 98–111)
Creatinine, Ser: 1.04 mg/dL (ref 0.61–1.24)
GFR calc Af Amer: >60 mL/min (ref >60)
GFR calc non Af Amer: >60 mL/min (ref >60)
Glucose, Bld: 98 mg/dL (ref 70–99)
Potassium: 4.7 mmol/L (ref 3.5–5.1)
Sodium: 134 mmol/L — ABNORMAL LOW (ref 135–145)
Total Bilirubin: 0.8 mg/dL (ref 0.3–1.2)
Total Protein: 6.8 g/dL (ref 6.5–8.1)

## 2019-12-25 LAB — CBC WITH DIFFERENTIAL/PLATELET
Abs Immature Granulocytes: 0.01 10*3/uL (ref 0.00–0.07)
Basophils Absolute: 0 10*3/uL (ref 0.0–0.1)
Basophils Relative: 1 %
Eosinophils Absolute: 0.3 10*3/uL (ref 0.0–0.5)
Eosinophils Relative: 3 %
HCT: 39.5 % (ref 39.0–52.0)
Hemoglobin: 13.5 g/dL (ref 13.0–17.0)
Immature Granulocytes: 0 %
Lymphocytes Relative: 32 %
Lymphs Abs: 2.6 10*3/uL (ref 0.7–4.0)
MCH: 30.6 pg (ref 26.0–34.0)
MCHC: 34.2 g/dL (ref 30.0–36.0)
MCV: 89.6 fL (ref 80.0–100.0)
Monocytes Absolute: 0.6 10*3/uL (ref 0.1–1.0)
Monocytes Relative: 8 %
Neutro Abs: 4.6 10*3/uL (ref 1.7–7.7)
Neutrophils Relative %: 56 %
Platelets: 188 10*3/uL (ref 150–400)
RBC: 4.41 MIL/uL (ref 4.22–5.81)
RDW: 13.1 % (ref 11.5–15.5)
WBC: 8.2 10*3/uL (ref 4.0–10.5)
nRBC: 0 % (ref 0.0–0.2)

## 2019-12-25 LAB — HEMOGLOBIN A1C
Hgb A1c MFr Bld: 6.2 % — ABNORMAL HIGH (ref 4.8–5.6)
Mean Plasma Glucose: 131.24 mg/dL

## 2019-12-25 LAB — HEPATIC FUNCTION PANEL
ALT: 21 U/L (ref 0–44)
AST: 25 U/L (ref 15–41)
Albumin: 4.1 g/dL (ref 3.5–5.0)
Alkaline Phosphatase: 44 U/L (ref 38–126)
Bilirubin, Direct: 0.1 mg/dL (ref 0.0–0.2)
Indirect Bilirubin: 0.6 mg/dL (ref 0.3–0.9)
Total Bilirubin: 0.7 mg/dL (ref 0.3–1.2)
Total Protein: 6.6 g/dL (ref 6.5–8.1)

## 2019-12-25 NOTE — Telephone Encounter (Signed)
A1C results have been faxed.

## 2020-01-01 ENCOUNTER — Other Ambulatory Visit: Payer: Self-pay | Admitting: Oncology

## 2020-01-01 DIAGNOSIS — C641 Malignant neoplasm of right kidney, except renal pelvis: Secondary | ICD-10-CM

## 2020-01-01 NOTE — Telephone Encounter (Signed)
CBC with Differential Order: [id number] Status:  Final result Visible to patient:  Yes (seen) Next appt:  01/05/2020 at 08:45 AM in Oncology ([facility]) Dx:  Clear cell carcinoma of right kidney ...  0 Result Notes  Ref Range & Units 7 d ago 2 wk ago  WBC 4.0 - 10.5 K/uL 8.2  7.7   RBC 4.22 - 5.81 MIL/uL 4.41  4.84   Hemoglobin 13.0 - 17.0 g/dL 13.5  14.8   HCT 39 - 52 % 39.5  44.3   MCV 80.0 - 100.0 fL 89.6  91.5   MCH 26.0 - 34.0 pg 30.6  30.6   MCHC 30.0 - 36.0 g/dL 34.2  33.4   RDW 11.5 - 15.5 % 13.1  12.7   Platelets 150 - 400 K/uL 188  214   nRBC 0.0 - 0.2 % 0.0  0.0   Neutrophils Relative % % 56  59   Neutro Abs 1.7 - 7.7 K/uL 4.6  4.6   Lymphocytes Relative % 32  29   Lymphs Abs 0.7 - 4.0 K/uL 2.6  2.3   Monocytes Relative % 8  7   Monocytes Absolute 0 - 1 K/uL 0.6  0.6   Eosinophils Relative % 3  3   Eosinophils Absolute 0 - 0 K/uL 0.3  0.2   Basophils Relative % 1  1   Basophils Absolute 0 - 0 K/uL 0.0  0.0   Immature Granulocytes % 0  1   Abs Immature Granulocytes 0.00 - 0.07 K/uL 0.01  0.04 CM   Comment: Performed at [facility], [address] [place], [place] [address]  Resulting Agency  [facility] LAB [facility] LAB      Specimen Collected: 12/25/19 10:31 Last Resulted: 12/25/19 10:46     Lab Flowsheet   Order Details   View Encounter   Lab and Collection Details   Routing   Result History     CM=Additional comments    Result Care Coordination  Patient Communication  Add Comments Seen Back to Top          Contains abnormal dataComprehensive metabolic panel  Status:  Final result Visible to patient:  Yes (seen) Next appt:  01/05/2020 at 08:45 AM in Oncology ([facility]) Dx:  Clear cell carcinoma of right kidney ... Order: [id number]  0 Result Notes   (important suggestion) Newer results are available. Click to view them now.   Ref Range & Units 7 d ago 2 wk ago  Sodium 135 - 145 mmol/L 134Low  137   Potassium 3.5 - 5.1  mmol/L 4.7  4.2   Chloride 98 - 111 mmol/L 104  102   CO2 22 - 32 mmol/L 24  27   Glucose, Bld 70 - 99 mg/dL 98  109High CM   Comment: Glucose reference range applies only to samples taken after fasting for at least 8 hours.  BUN 6 - 20 mg/dL 24High  23High   Creatinine, Ser 0.61 - 1.24 mg/dL 1.04  1.10   Calcium 8.9 - 10.3 mg/dL 8.8Low  9.3   Total Protein 6.5 - 8.1 g/dL 6.8  7.1   Albumin 3.5 - 5.0 g/dL 4.0  4.4   AST 15 - 41 U/L 24  23   ALT 0 - 44 U/L 22  24   Alkaline Phosphatase 38 - 126 U/L 46  52   Total Bilirubin 0.3 - 1.2 mg/dL 0.8  0.8   GFR calc non Af Amer >60  mL/min >60  >60   GFR calc Af Amer >60 mL/min >60  >60   Anion gap 5 - 15 6  8  CM   Comment: Performed at [facility], [address] [place], [place] [address]  [place]  [facility] CLIN LAB [facility] CL[facility] LAB      Specimen Collected: 12/25/19 10:31 Last Resulted: 12/25/19 10:56

## 2020-01-05 ENCOUNTER — Inpatient Hospital Stay: Payer: BC Managed Care – PPO

## 2020-01-05 ENCOUNTER — Encounter: Payer: Self-pay | Admitting: Oncology

## 2020-01-05 ENCOUNTER — Other Ambulatory Visit: Payer: Self-pay

## 2020-01-05 ENCOUNTER — Inpatient Hospital Stay (HOSPITAL_BASED_OUTPATIENT_CLINIC_OR_DEPARTMENT_OTHER): Payer: BC Managed Care – PPO | Admitting: Oncology

## 2020-01-05 VITALS — BP 122/78 | HR 80 | Temp 97.8°F | Resp 20 | Wt 213.7 lb

## 2020-01-05 VITALS — BP 123/78 | HR 74 | Resp 18

## 2020-01-05 DIAGNOSIS — Z5112 Encounter for antineoplastic immunotherapy: Secondary | ICD-10-CM

## 2020-01-05 DIAGNOSIS — Z5111 Encounter for antineoplastic chemotherapy: Secondary | ICD-10-CM | POA: Diagnosis not present

## 2020-01-05 DIAGNOSIS — C641 Malignant neoplasm of right kidney, except renal pelvis: Secondary | ICD-10-CM

## 2020-01-05 LAB — CBC WITH DIFFERENTIAL/PLATELET
Abs Immature Granulocytes: 0.05 10*3/uL (ref 0.00–0.07)
Basophils Absolute: 0 10*3/uL (ref 0.0–0.1)
Basophils Relative: 0 %
Eosinophils Absolute: 0.4 10*3/uL (ref 0.0–0.5)
Eosinophils Relative: 4 %
HCT: 41.6 % (ref 39.0–52.0)
Hemoglobin: 14.1 g/dL (ref 13.0–17.0)
Immature Granulocytes: 1 %
Lymphocytes Relative: 30 %
Lymphs Abs: 2.9 10*3/uL (ref 0.7–4.0)
MCH: 30.9 pg (ref 26.0–34.0)
MCHC: 33.9 g/dL (ref 30.0–36.0)
MCV: 91.2 fL (ref 80.0–100.0)
Monocytes Absolute: 0.8 10*3/uL (ref 0.1–1.0)
Monocytes Relative: 8 %
Neutro Abs: 5.3 10*3/uL (ref 1.7–7.7)
Neutrophils Relative %: 57 %
Platelets: 192 10*3/uL (ref 150–400)
RBC: 4.56 MIL/uL (ref 4.22–5.81)
RDW: 13 % (ref 11.5–15.5)
WBC: 9.5 10*3/uL (ref 4.0–10.5)
nRBC: 0 % (ref 0.0–0.2)

## 2020-01-05 LAB — COMPREHENSIVE METABOLIC PANEL
ALT: 21 U/L (ref 0–44)
AST: 23 U/L (ref 15–41)
Albumin: 4.2 g/dL (ref 3.5–5.0)
Alkaline Phosphatase: 50 U/L (ref 38–126)
Anion gap: 6 (ref 5–15)
BUN: 26 mg/dL — ABNORMAL HIGH (ref 6–20)
CO2: 27 mmol/L (ref 22–32)
Calcium: 9.1 mg/dL (ref 8.9–10.3)
Chloride: 101 mmol/L (ref 98–111)
Creatinine, Ser: 1.2 mg/dL (ref 0.61–1.24)
GFR calc Af Amer: >60 mL/min (ref >60)
GFR calc non Af Amer: >60 mL/min (ref >60)
Glucose, Bld: 94 mg/dL (ref 70–99)
Potassium: 4.4 mmol/L (ref 3.5–5.1)
Sodium: 134 mmol/L — ABNORMAL LOW (ref 135–145)
Total Bilirubin: 0.7 mg/dL (ref 0.3–1.2)
Total Protein: 6.9 g/dL (ref 6.5–8.1)

## 2020-01-05 MED ORDER — SODIUM CHLORIDE 0.9 % IV SOLN
200.0000 mg | Freq: Once | INTRAVENOUS | Status: AC
  Administered 2020-01-05: 200 mg via INTRAVENOUS
  Filled 2020-01-05: qty 8

## 2020-01-05 MED ORDER — SODIUM CHLORIDE 0.9 % IV SOLN
Freq: Once | INTRAVENOUS | Status: AC
  Filled 2020-01-05: qty 250

## 2020-01-05 NOTE — Progress Notes (Signed)
Hematology/Oncology follow up note [facility] Telephone:([phone] Fax:([phone]   Patient Care Team: [name], NP as PCP - General [name], [name], MD as PCP - Family Medicine (Family Medicine) [name], MD as Consulting Physician (General Surgery) [name], [name], MD as Attending Physician (Family Medicine) [name], [name], MD as Consulting Physician (Dermatology)  REFERRING PROVIDER: [name], NP  CHIEF COMPLAINTS/REASON FOR VISIT:  Follow-up of kidney cancer  HISTORY OF PRESENTING ILLNESS:   Matthew Woodard is a  54 y.o.  male with PMH listed below was seen in consultation at the request of  [name], NP  for evaluation of kidney cancer Patient's cancer history dated back to February 2020 when he developed gross hematuria with small clots and right-sided flank pain.  Patient had a CT urogram done which showed right 4 cm central enhancing renal mass with invasion into the collecting system.  X-ray of chest was performed to see complete staging which showed no concerning findings. 06/28/2018 patient underwent a cystoscopy, bladder biopsy and a right retrograde pyelogram with intraoperative interpretation, right diagnostic ureteroscopy, right renal pelvis biopsy, right ureteral stent placement.  biopsy showed atypical cell clusters, with extensive crush artifact, nondiagnostic.   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  Surgical margins negative for tumor.pT3a,Nx Patient has been on surveillance after surgery. 10/28/2018 CT abdomen pelvis with contrast showed minimal fluid and or postoperative changes within the right renal fossa.  No evidence of metastatic disease or recurrence.  Patient has left kidney lesion previously characterized as  nonenhancing cyst by multi phasic contrast-enhanced CT. Patient reports an episode of gross hematuria in early December 2020, patient is due for 63-month surveillance CT scan. 04/30/2019 CT abdomen pelvis with contrast showed high attenuation mass at the right uretero vesicle junction, with extension into the bladder.  Bilateral pulmonary nodules, most indicated for metastatic disease.   Patient underwent cystoscopy and transurethral resection of irregular bladder tumor 3 cm.  Mass appeared to emanate from the right ureteral orifice.  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 Patient was referred to me for further work-up and management.  #Staging chest CT with contrast showed numerous bilateral pulmonary nodules.  Compatible with metastatic disease. Bone scan showed no osseous metastatic disease.   #History of tobacco abuse, former smoker.  Quit in 2015.  He denies any hemoptysis, chest pain, shortness of breath today. Patient reports that hematuria has completely resolved.  Denies any pain today. He is accompanied by his wife.  #History of cutaneous psoriasis, mostly on his right hand, currently on weekly methotrexate 12.5 mg with good symptom control. #NGS: [facility] PD L1 TPS 1%  # patient was referred to ENT for evaluation of headache and sinus pressure.  He had a ENT evaluation and feels that patient does not have sinusitis.  [name] recommended MRI brain to rule out PRES syndrome.  Axitinib has been held MRI brain was done and was negative.   #  off axitinib and Keytruda since the beginning of May due to transaminitis. Patient was seen and evaluated by gastroenterology [name]. Work-up was negative. Ultrasound liver vascular Doppler is negative. Recommend observation.  INTERVAL HISTORY Matthew Woodard is a 55 y.o. male who has above history reviewed by me today presents for follow up visit for management of metastatic  RCC. Problems  and complaints are listed below: Patient has no new concerns.  He feels well.    Review of Systems  Constitutional: Negative for appetite change, chills, fatigue, fever and unexpected weight change.  HENT:   Negative for hearing loss and voice change.   Eyes: Negative for eye problems and icterus.  Respiratory: Negative for chest tightness, cough and shortness of breath.   Cardiovascular: Negative for chest pain and leg swelling.  Gastrointestinal: Negative for abdominal distention, abdominal pain, blood in stool and nausea.  Endocrine: Negative for hot flashes.  Genitourinary: Negative for difficulty urinating, dysuria and frequency.   Musculoskeletal: Negative for arthralgias.  Skin: Negative for itching and rash.  Neurological: Negative for extremity weakness, headaches, light-headedness and numbness.  Hematological: Negative for adenopathy. Does not bruise/bleed easily.  Psychiatric/Behavioral: Negative for confusion.    MEDICAL HISTORY:  Past Medical History:  Diagnosis Date  . Arthritis    hands, left knee  . Cancer ([place])   . Diabetes mellitus without complication ([place])   . Eczema 01/08/2017  . GERD (gastroesophageal reflux disease)   . History of kidney cancer   . Hyperlipidemia LDL goal <100 11/02/2014  . Hypertension   . Kidney stone    YEAR H/O HEMATURIA  . Psoriasis   . Wears dentures    full upper, partial lower.  Has, does not wear    SURGICAL HISTORY: Past Surgical History:  Procedure Laterality Date  . COLONOSCOPY WITH PROPOFOL N/A 02/23/2017   Procedure: COLONOSCOPY WITH PROPOFOL;  Surgeon: [name], MD;  Location: [place];  Service: Endoscopy;  Laterality: N/A;  Diabetic - oral meds  . CYST EXCISION  Aug. 2016   on back  . CYSTOSCOPY W/ RETROGRADES Right 06/28/2018   Procedure: CYSTOSCOPY WITH RETROGRADE PYELOGRAM;  Surgeon: Billey Co, MD;  Location: [facility];  Service: Urology;  Laterality: Right;  . CYSTOSCOPY W/ URETERAL STENT  REMOVAL Right 07/22/2018   Procedure: CYSTOSCOPY WITH STENT REMOVAL;  Surgeon: Billey Co, MD;  Location: [facility];  Service: Urology;  Laterality: Right;  . CYSTOSCOPY WITH BIOPSY Right 06/28/2018   Procedure: CYSTOSCOPY WITH RENAL BIOPSY;  Surgeon: Billey Co, MD;  Location: [facility];  Service: Urology;  Laterality: Right;  . CYSTOSCOPY WITH URETEROSCOPY AND STENT PLACEMENT Right 06/28/2018   Procedure: CYSTOSCOPY WITH URETEROSCOPY AND STENT PLACEMENT;  Surgeon: Billey Co, MD;  Location: [facility];  Service: Urology;  Laterality: Right;  . FULGURATION OF BLADDER TUMOR N/A 06/28/2018   Procedure: CYSTOSCOPY BLADDER BIOPSY, FULGERATION OF BLADDER;  Surgeon: Billey Co, MD;  Location: [facility];  Service: Urology;  Laterality: N/A;  . KNEE SURGERY Left   . LAPAROSCOPIC NEPHRECTOMY, HAND ASSISTED Right 07/22/2018   Procedure: HAND ASSISTED LAPAROSCOPIC NEPHRECTOMY;  Surgeon: Billey Co, MD;  Location: [facility];  Service: Urology;  Laterality: Right;  . NASAL SINUS SURGERY  03/15  . POLYPECTOMY N/A 02/23/2017   Procedure: POLYPECTOMY;  Surgeon: [name], MD;  Location: [place];  Service: Endoscopy;  Laterality: N/A;  . tooth abstraction    . TRANSURETHRAL RESECTION OF BLADDER TUMOR N/A 05/12/2019   Procedure: TRANSURETHRAL RESECTION OF BLADDER TUMOR (TURBT);  Surgeon: Billey Co, MD;  Location: [facility];  Service: Urology;  Laterality: N/A;  . VARICOCELE EXCISION      SOCIAL HISTORY: Social History   Socioeconomic History  . Marital status: Married    Spouse name: Not on file  . Number of children: Not on file  .  Years of education: Not on file  . Highest education level: Not on file  Occupational History  . Not on file  Tobacco Use  . Smoking status: Former Smoker    Types: Cigarettes    Quit date: 06/15/2013    Years since quitting: 6.5  . Smokeless tobacco: Never Used  Vaping Use  . Vaping Use: Never used  Substance and Sexual Activity    . Alcohol use: No    Alcohol/week: 0.0 standard drinks  . Drug use: No  . Sexual activity: Yes  Other Topics Concern  . Not on file  Social History Narrative  . Not on file   Social Determinants of Health   Financial Resource Strain:   . Difficulty of Paying Living Expenses: Not on file  Food Insecurity:   . Worried About Charity fundraiser in the Last Year: Not on file  . Ran Out of Food in the Last Year: Not on file  Transportation Needs:   . Lack of Transportation (Medical): Not on file  . Lack of Transportation (Non-Medical): Not on file  Physical Activity:   . Days of Exercise per Week: Not on file  . Minutes of Exercise per Session: Not on file  Stress:   . Feeling of Stress : Not on file  Social Connections:   . Frequency of Communication with Friends and Family: Not on file  . Frequency of Social Gatherings with Friends and Family: Not on file  . Attends Religious Services: Not on file  . Active Member of Clubs or Organizations: Not on file  . Attends Archivist Meetings: Not on file  . Marital Status: Not on file  Intimate Partner Violence:   . Fear of Current or Ex-Partner: Not on file  . Emotionally Abused: Not on file  . Physically Abused: Not on file  . Sexually Abused: Not on file    FAMILY HISTORY: Family History  Problem Relation Age of Onset  . Heart disease Father   . Hypertension Father   . COPD Father   . Colon cancer Maternal Grandmother   . Heart attack Paternal Grandfather     ALLERGIES:  is allergic to glucotrol [glipizide].  MEDICATIONS:  Current Outpatient Medications  Medication Sig Dispense Refill  . amLODipine (NORVASC) 2.5 MG tablet Take 1 tablet (2.5 mg total) by mouth daily. (Patient not taking: Reported on 11/24/2019) 30 tablet 3  . aspirin EC 81 MG tablet Take 81 mg by mouth daily.    . [place]-Glucosamine-Vit D (OSTEO BI-FLEX ONE PER DAY) TABS Take 1 tablet by mouth daily.    . clobetasol cream (TEMOVATE) 9.38 %  Apply 1 application topically 2 (two) times daily. To affected skin; too strong for face, groin, underarms (Patient not taking: Reported on 07/17/2019) 30 g 2  . diclofenac Sodium (VOLTAREN) 1 % GEL Apply 2 g topically 4 (four) times daily. (Patient not taking: Reported on 11/24/2019) 100 g 1  . Glucose Blood (ACCU-CHEK AVIVA PLUS VI) by In Vitro route.    . INLYTA 1 MG tablet TAKE 3 TABLETS BY MOUTH TWICE DAILY WITH A FULL GLASS OF WATER. AVOID GRAPEFRUIT PRODUCTS. DO NOT START UNTIL 01/05/20. 180 tablet 0  . JARDIANCE 10 MG TABS tablet TAKE 1 TABLET BY MOUTH DAILY (Patient taking differently: Take 10 mg by mouth daily. ) 30 tablet 2  . lisinopril (PRINIVIL,ZESTRIL) 10 MG tablet TAKE 1 TABLET(10 MG) BY MOUTH DAILY (Patient taking differently: Take 10 mg by mouth daily. ) 90  tablet 1  . loperamide (IMODIUM) 2 MG capsule Take 1 capsule (2 mg total) by mouth See admin instructions. With onset of loose stool, take 4mg  followed by 2mg  every 2 hours until 12 hours have passed without loose bowel movement. Maximum: 16 mg/day (Patient not taking: Reported on 12/15/2019) 60 capsule 0  . metFORMIN (GLUCOPHAGE-XR) 500 MG 24 hr tablet Take 3 pills by mouth daily for 4-5 days, then increase to 4 pills daily (if tolerated) (Patient taking differently: Take 2,000 mg by mouth daily with breakfast. ) 360 tablet 0  . Multiple Vitamin (MULTIVITAMIN) tablet Take 1 tablet by mouth daily.    . Omega-3 Fatty Acids (FISH OIL) 1200 MG CAPS Take 1,200 mg by mouth daily.     . Omeprazole (PRILOSEC PO) Take 20 mg by mouth daily as needed (heart burn).     Marland Kitchen OZEMPIC, 0.25 OR 0.5 MG/DOSE, 2 MG/1.5ML SOPN once a week.     . pioglitazone (ACTOS) 45 MG tablet TAKE 1 TABLET(45 MG) BY MOUTH DAILY (Patient taking differently: Take 22.5 mg by mouth daily. ) 90 tablet 1  . prochlorperazine (COMPAZINE) 10 MG tablet Take 1 tablet (10 mg total) by mouth every 6 (six) hours as needed for nausea or vomiting. 15 tablet 0  . rosuvastatin (CRESTOR) 10  MG tablet Take 1 tablet (10 mg total) by mouth at bedtime. 90 tablet 1   No current facility-administered medications for this visit.     PHYSICAL EXAMINATION: ECOG PERFORMANCE STATUS: 0 - Asymptomatic Vitals:   01/05/20 0917  BP: 122/78  Pulse: 80  Resp: 20  Temp: 97.8 F (36.6 C)  SpO2: 98%   Filed Weights   01/05/20 0917  Weight: 213 lb 11.2 oz (96.9 kg)    Physical Exam Constitutional:      General: He is not in acute distress. HENT:     Head: Normocephalic and atraumatic.  Eyes:     General: No scleral icterus.    Pupils: Pupils are equal, round, and reactive to light.  Cardiovascular:     Rate and Rhythm: Normal rate and regular rhythm.     Heart sounds: Normal heart sounds.  Pulmonary:     Effort: Pulmonary effort is normal. No respiratory distress.     Breath sounds: No wheezing.  Abdominal:     General: Bowel sounds are normal. There is no distension.     Palpations: Abdomen is soft. There is no mass.     Tenderness: There is no abdominal tenderness.  Musculoskeletal:        General: No deformity. Normal range of motion.     Cervical back: Normal range of motion and neck supple.  Skin:    General: Skin is warm and dry.     Findings: No erythema or rash.  Neurological:     Mental Status: He is alert and oriented to person, place, and time. Mental status is at baseline.     Cranial Nerves: No cranial nerve deficit.     Coordination: Coordination normal.  Psychiatric:        Mood and Affect: Mood normal.     LABORATORY DATA:  I have reviewed the data as listed Lab Results  Component Value Date   WBC 8.2 12/25/2019   HGB 13.5 12/25/2019   HCT 39.5 12/25/2019   MCV 89.6 12/25/2019   PLT 188 12/25/2019   Recent Labs    11/13/19 1037 11/13/19 1037 11/24/19 0920 11/24/19 0920 12/04/19 1117 12/04/19 1117 12/15/19 0812 12/25/19 1031  12/25/19 1125  NA  --   --  137  --   --   --  137 134*  --   K  --   --  4.4  --   --   --  4.2 4.7  --   CL   --   --  103  --   --   --  102 104  --   CO2  --   --  26  --   --   --  27 24  --   GLUCOSE  --   --  102*  --   --   --  109* 98  --   BUN  --   --  21*  --   --   --  23* 24*  --   CREATININE  --   --  1.14  --   --   --  1.10 1.04  --   CALCIUM  --   --  9.2  --   --   --  9.3 8.8*  --   GFRNONAA  --   --  >60  --   --   --  >60 >60  --   GFRAA  --   --  >60  --   --   --  >60 >60  --   PROT 7.0   < > 7.2   < > 7.1   < > 7.1 6.8 6.6  ALBUMIN 4.0   < > 4.2   < > 4.3   < > 4.4 4.0 4.1  AST 31   < > 24   < > 28   < > 23 24 25   ALT 46*   < > 27   < > 24   < > 24 22 21   ALKPHOS 49   < > 52   < > 44   < > 52 46 44  BILITOT 0.9   < > 0.8   < > 1.0   < > 0.8 0.8 0.7  BILIDIR 0.2  --   --   --  0.1  --   --   --  0.1  IBILI 0.7  --   --   --  0.9  --   --   --  0.6   < > = values in this interval not displayed.   Iron/TIBC/Ferritin/ %Sat    Component Value Date/Time   IRON 118 09/25/2019 1115   TIBC 454 (H) 09/25/2019 1115   FERRITIN 236 09/25/2019 1115   IRONPCTSAT 26 09/25/2019 1115      RADIOGRAPHIC STUDIES: I have personally reviewed the radiological images as listed and agreed with the findings in the report.  No results found.    ASSESSMENT & PLAN:  1. Clear cell carcinoma of right kidney ([place])   2. Encounter for antineoplastic immunotherapy   3. 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 Axitinib has been on hold due to liver toxicities -transaminitis. Labs are reviewed and discussed with patient.  Transaminitis has completely resolved and normalized. No increase after reinitiated Keytruda. Continue to proceed Keytruda today. Discussed with patient I recommend patient to resume all axitinib at a lower dose 3 mg twice daily starting today. LFT weekly to monitor transaminitis. Patient to follow-up in 3 weeks for lab, MD and Keytruda.  #Patient is not vaccinated for COVID-19.  I discussed  with him and  recommend COVID-19 vaccination.  We spent sufficient time to discuss many aspect of care, questions were answered to patient's satisfaction. All questions were answered. The patient knows to call the clinic with any problems questions or concerns.  Earlie Server, MD, PhD Hematology Oncology [facility] at [facility] Pager- [phone] 01/05/2020

## 2020-01-12 ENCOUNTER — Other Ambulatory Visit: Payer: Self-pay

## 2020-01-12 ENCOUNTER — Inpatient Hospital Stay: Payer: BC Managed Care – PPO

## 2020-01-12 DIAGNOSIS — C641 Malignant neoplasm of right kidney, except renal pelvis: Secondary | ICD-10-CM

## 2020-01-12 DIAGNOSIS — Z5112 Encounter for antineoplastic immunotherapy: Secondary | ICD-10-CM | POA: Diagnosis not present

## 2020-01-12 LAB — HEPATIC FUNCTION PANEL
ALT: 45 U/L — ABNORMAL HIGH (ref 0–44)
AST: 30 U/L (ref 15–41)
Albumin: 4 g/dL (ref 3.5–5.0)
Alkaline Phosphatase: 60 U/L (ref 38–126)
Bilirubin, Direct: 0.2 mg/dL (ref 0.0–0.2)
Indirect Bilirubin: 0.5 mg/dL (ref 0.3–0.9)
Total Bilirubin: 0.7 mg/dL (ref 0.3–1.2)
Total Protein: 6.8 g/dL (ref 6.5–8.1)

## 2020-01-20 ENCOUNTER — Inpatient Hospital Stay: Payer: BC Managed Care – PPO | Attending: Oncology

## 2020-01-20 ENCOUNTER — Other Ambulatory Visit: Payer: Self-pay

## 2020-01-20 DIAGNOSIS — Z79899 Other long term (current) drug therapy: Secondary | ICD-10-CM | POA: Diagnosis not present

## 2020-01-20 DIAGNOSIS — Z905 Acquired absence of kidney: Secondary | ICD-10-CM | POA: Diagnosis not present

## 2020-01-20 DIAGNOSIS — C7801 Secondary malignant neoplasm of right lung: Secondary | ICD-10-CM | POA: Insufficient documentation

## 2020-01-20 DIAGNOSIS — L409 Psoriasis, unspecified: Secondary | ICD-10-CM | POA: Diagnosis not present

## 2020-01-20 DIAGNOSIS — C7802 Secondary malignant neoplasm of left lung: Secondary | ICD-10-CM | POA: Insufficient documentation

## 2020-01-20 DIAGNOSIS — Z87891 Personal history of nicotine dependence: Secondary | ICD-10-CM | POA: Insufficient documentation

## 2020-01-20 DIAGNOSIS — C641 Malignant neoplasm of right kidney, except renal pelvis: Secondary | ICD-10-CM | POA: Insufficient documentation

## 2020-01-20 LAB — HEPATIC FUNCTION PANEL
ALT: 238 U/L — ABNORMAL HIGH (ref 0–44)
AST: 148 U/L — ABNORMAL HIGH (ref 15–41)
Albumin: 4.3 g/dL (ref 3.5–5.0)
Alkaline Phosphatase: 71 U/L (ref 38–126)
Bilirubin, Direct: 0.2 mg/dL (ref 0.0–0.2)
Indirect Bilirubin: 1 mg/dL — ABNORMAL HIGH (ref 0.3–0.9)
Total Bilirubin: 1.2 mg/dL (ref 0.3–1.2)
Total Protein: 7.2 g/dL (ref 6.5–8.1)

## 2020-01-21 ENCOUNTER — Telehealth: Payer: Self-pay

## 2020-01-21 NOTE — Telephone Encounter (Signed)
Matthew Woodard, RPH-CPP notified and she will notify [facility] that medication is being stopped.

## 2020-01-21 NOTE — Telephone Encounter (Signed)
-----   Message from Earlie Server, MD sent at 01/20/2020  7:21 PM EDT ----- I have called pt and advised him to stop axitinib. Please discontinue his supply.  Follow up as planned and he is made aware.

## 2020-01-26 ENCOUNTER — Inpatient Hospital Stay (HOSPITAL_BASED_OUTPATIENT_CLINIC_OR_DEPARTMENT_OTHER): Payer: BC Managed Care – PPO | Admitting: Oncology

## 2020-01-26 ENCOUNTER — Inpatient Hospital Stay: Payer: BC Managed Care – PPO

## 2020-01-26 ENCOUNTER — Encounter: Payer: Self-pay | Admitting: Oncology

## 2020-01-26 ENCOUNTER — Other Ambulatory Visit: Payer: Self-pay

## 2020-01-26 VITALS — BP 123/78 | HR 85 | Temp 96.0°F | Resp 18 | Wt 211.0 lb

## 2020-01-26 DIAGNOSIS — R7401 Elevation of levels of liver transaminase levels: Secondary | ICD-10-CM

## 2020-01-26 DIAGNOSIS — C641 Malignant neoplasm of right kidney, except renal pelvis: Secondary | ICD-10-CM

## 2020-01-26 DIAGNOSIS — Z5112 Encounter for antineoplastic immunotherapy: Secondary | ICD-10-CM

## 2020-01-26 LAB — CBC WITH DIFFERENTIAL/PLATELET
Abs Immature Granulocytes: 0.05 10*3/uL (ref 0.00–0.07)
Basophils Absolute: 0.1 10*3/uL (ref 0.0–0.1)
Basophils Relative: 1 %
Eosinophils Absolute: 0.4 10*3/uL (ref 0.0–0.5)
Eosinophils Relative: 5 %
HCT: 41.1 % (ref 39.0–52.0)
Hemoglobin: 13.9 g/dL (ref 13.0–17.0)
Immature Granulocytes: 1 %
Lymphocytes Relative: 29 %
Lymphs Abs: 2.4 10*3/uL (ref 0.7–4.0)
MCH: 30.6 pg (ref 26.0–34.0)
MCHC: 33.8 g/dL (ref 30.0–36.0)
MCV: 90.5 fL (ref 80.0–100.0)
Monocytes Absolute: 0.8 10*3/uL (ref 0.1–1.0)
Monocytes Relative: 10 %
Neutro Abs: 4.4 10*3/uL (ref 1.7–7.7)
Neutrophils Relative %: 54 %
Platelets: 191 10*3/uL (ref 150–400)
RBC: 4.54 MIL/uL (ref 4.22–5.81)
RDW: 13.3 % (ref 11.5–15.5)
WBC: 8.1 10*3/uL (ref 4.0–10.5)
nRBC: 0 % (ref 0.0–0.2)

## 2020-01-26 LAB — COMPREHENSIVE METABOLIC PANEL
ALT: 336 U/L — ABNORMAL HIGH (ref 0–44)
AST: 183 U/L — ABNORMAL HIGH (ref 15–41)
Albumin: 3.9 g/dL (ref 3.5–5.0)
Alkaline Phosphatase: 60 U/L (ref 38–126)
Anion gap: 9 (ref 5–15)
BUN: 23 mg/dL — ABNORMAL HIGH (ref 6–20)
CO2: 24 mmol/L (ref 22–32)
Calcium: 9.2 mg/dL (ref 8.9–10.3)
Chloride: 102 mmol/L (ref 98–111)
Creatinine, Ser: 1.1 mg/dL (ref 0.61–1.24)
GFR calc Af Amer: >60 mL/min (ref >60)
GFR calc non Af Amer: >60 mL/min (ref >60)
Glucose, Bld: 96 mg/dL (ref 70–99)
Potassium: 4.6 mmol/L (ref 3.5–5.1)
Sodium: 135 mmol/L (ref 135–145)
Total Bilirubin: 0.9 mg/dL (ref 0.3–1.2)
Total Protein: 6.6 g/dL (ref 6.5–8.1)

## 2020-01-26 LAB — TSH: TSH: 0.702 u[IU]/mL (ref 0.350–4.500)

## 2020-01-26 NOTE — Progress Notes (Signed)
Patient here for his Keytruda infusions. Offers no complaints today.

## 2020-01-26 NOTE — Progress Notes (Signed)
Hematology/Oncology follow up note [facility] Telephone:([phone] Fax:([phone]   Patient Care Team: [name], NP as PCP - General [name], [name], MD as PCP - Family Medicine (Family Medicine) [name], MD as Consulting Physician (General Surgery) [name], [name], MD as Attending Physician (Family Medicine) [name], [name], MD as Consulting Physician (Dermatology)  REFERRING PROVIDER: [name], NP  CHIEF COMPLAINTS/REASON FOR VISIT:  Follow-up of kidney cancer  HISTORY OF PRESENTING ILLNESS:   Matthew Woodard is a  54 y.o.  male with PMH listed below was seen in consultation at the request of  [name], NP  for evaluation of kidney cancer Patient's cancer history dated back to February 2020 when he developed gross hematuria with small clots and right-sided flank pain.  Patient had a CT urogram done which showed right 4 cm central enhancing renal mass with invasion into the collecting system.  X-ray of chest was performed to see complete staging which showed no concerning findings. 06/28/2018 patient underwent a cystoscopy, bladder biopsy and a right retrograde pyelogram with intraoperative interpretation, right diagnostic ureteroscopy, right renal pelvis biopsy, right ureteral stent placement.  biopsy showed atypical cell clusters, with extensive crush artifact, nondiagnostic.   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  Surgical margins negative for tumor.pT3a,Nx Patient has been on surveillance after surgery. 10/28/2018 CT abdomen pelvis with contrast showed minimal fluid and or postoperative changes within the right renal fossa.  No evidence of metastatic disease or recurrence.  Patient has left kidney lesion previously characterized as  nonenhancing cyst by multi phasic contrast-enhanced CT. Patient reports an episode of gross hematuria in early December 2020, patient is due for 63-month surveillance CT scan. 04/30/2019 CT abdomen pelvis with contrast showed high attenuation mass at the right uretero vesicle junction, with extension into the bladder.  Bilateral pulmonary nodules, most indicated for metastatic disease.   Patient underwent cystoscopy and transurethral resection of irregular bladder tumor 3 cm.  Mass appeared to emanate from the right ureteral orifice.  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 Patient was referred to me for further work-up and management.  #Staging chest CT with contrast showed numerous bilateral pulmonary nodules.  Compatible with metastatic disease. Bone scan showed no osseous metastatic disease.   #History of tobacco abuse, former smoker.  Quit in 2015.  He denies any hemoptysis, chest pain, shortness of breath today. Patient reports that hematuria has completely resolved.  Denies any pain today. He is accompanied by his wife.  #History of cutaneous psoriasis, mostly on his right hand, currently on weekly methotrexate 12.5 mg with good symptom control. #NGS: [facility] PD L1 TPS 1%  # patient was referred to ENT for evaluation of headache and sinus pressure.  He had a ENT evaluation and feels that patient does not have sinusitis.  [name] recommended MRI brain to rule out PRES syndrome.  Axitinib has been held MRI brain was done and was negative.   #  off axitinib and Keytruda since the beginning of May due to transaminitis. Patient was seen and evaluated by gastroenterology [name]. Work-up was negative. Ultrasound liver vascular Doppler is negative. Recommend observation.  INTERVAL HISTORY Matthew Woodard is a 55 y.o. male who has above history reviewed by me today presents for follow up visit for management of metastatic  RCC. Problems  and complaints are listed below: During the interval, patient has had weekly liver function test done which showed transaminitis.  Patient was advised to stop axitinib.  He has no new concerns today.  No nausea vomiting diarrhea.    Review of Systems  Constitutional: Negative for appetite change, chills, fatigue, fever and unexpected weight change.  HENT:   Negative for hearing loss and voice change.   Eyes: Negative for eye problems and icterus.  Respiratory: Negative for chest tightness, cough and shortness of breath.   Cardiovascular: Negative for chest pain and leg swelling.  Gastrointestinal: Negative for abdominal distention, abdominal pain, blood in stool and nausea.  Endocrine: Negative for hot flashes.  Genitourinary: Negative for difficulty urinating, dysuria and frequency.   Musculoskeletal: Negative for arthralgias.  Skin: Negative for itching and rash.  Neurological: Negative for extremity weakness, headaches, light-headedness and numbness.  Hematological: Negative for adenopathy. Does not bruise/bleed easily.  Psychiatric/Behavioral: Negative for confusion.    MEDICAL HISTORY:  Past Medical History:  Diagnosis Date  . Arthritis    hands, left knee  . Cancer ([place])   . Diabetes mellitus without complication ([place])   . Eczema 01/08/2017  . GERD (gastroesophageal reflux disease)   . History of kidney cancer   . Hyperlipidemia LDL goal <100 11/02/2014  . Hypertension   . Kidney stone    YEAR H/O HEMATURIA  . Psoriasis   . Wears dentures    full upper, partial lower.  Has, does not wear    SURGICAL HISTORY: Past Surgical History:  Procedure Laterality Date  . COLONOSCOPY WITH PROPOFOL N/A 02/23/2017   Procedure: COLONOSCOPY WITH PROPOFOL;  Surgeon: [name], MD;  Location: [place];  Service: Endoscopy;  Laterality: N/A;  Diabetic - oral meds  . CYST EXCISION  Aug. 2016   on back  . CYSTOSCOPY W/ RETROGRADES Right 06/28/2018   Procedure: CYSTOSCOPY  WITH RETROGRADE PYELOGRAM;  Surgeon: Billey Co, MD;  Location: [facility];  Service: Urology;  Laterality: Right;  . CYSTOSCOPY W/ URETERAL STENT REMOVAL Right 07/22/2018   Procedure: CYSTOSCOPY WITH STENT REMOVAL;  Surgeon: Billey Co, MD;  Location: [facility];  Service: Urology;  Laterality: Right;  . CYSTOSCOPY WITH BIOPSY Right 06/28/2018   Procedure: CYSTOSCOPY WITH RENAL BIOPSY;  Surgeon: Billey Co, MD;  Location: [facility];  Service: Urology;  Laterality: Right;  . CYSTOSCOPY WITH URETEROSCOPY AND STENT PLACEMENT Right 06/28/2018   Procedure: CYSTOSCOPY WITH URETEROSCOPY AND STENT PLACEMENT;  Surgeon: Billey Co, MD;  Location: [facility];  Service: Urology;  Laterality: Right;  . FULGURATION OF BLADDER TUMOR N/A 06/28/2018   Procedure: CYSTOSCOPY BLADDER BIOPSY, FULGERATION OF BLADDER;  Surgeon: Billey Co, MD;  Location: [facility];  Service: Urology;  Laterality: N/A;  . KNEE SURGERY Left   . LAPAROSCOPIC NEPHRECTOMY, HAND ASSISTED Right 07/22/2018   Procedure: HAND ASSISTED LAPAROSCOPIC NEPHRECTOMY;  Surgeon: Billey Co, MD;  Location: [facility];  Service: Urology;  Laterality: Right;  . NASAL SINUS SURGERY  03/15  . POLYPECTOMY N/A 02/23/2017   Procedure: POLYPECTOMY;  Surgeon: [name], MD;  Location: [place];  Service: Endoscopy;  Laterality: N/A;  . tooth abstraction    . TRANSURETHRAL RESECTION OF BLADDER TUMOR N/A 05/12/2019   Procedure: TRANSURETHRAL RESECTION OF BLADDER TUMOR (TURBT);  Surgeon: Billey Co, MD;  Location: [facility];  Service: Urology;  Laterality: N/A;  . VARICOCELE EXCISION      SOCIAL HISTORY: Social History  Socioeconomic History  . Marital status: Married    Spouse name: Not on file  . Number of children: Not on file  . Years of education: Not on file  . Highest education level: Not on file  Occupational History  . Not on file  Tobacco Use  . Smoking status: Former Smoker    Types: Cigarettes    Quit  date: 06/15/2013    Years since quitting: 6.6  . Smokeless tobacco: Never Used  Vaping Use  . Vaping Use: Never used  Substance and Sexual Activity  . Alcohol use: No    Alcohol/week: 0.0 standard drinks  . Drug use: No  . Sexual activity: Yes  Other Topics Concern  . Not on file  Social History Narrative  . Not on file   Social Determinants of Health   Financial Resource Strain:   . Difficulty of Paying Living Expenses: Not on file  Food Insecurity:   . Worried About Charity fundraiser in the Last Year: Not on file  . Ran Out of Food in the Last Year: Not on file  Transportation Needs:   . Lack of Transportation (Medical): Not on file  . Lack of Transportation (Non-Medical): Not on file  Physical Activity:   . Days of Exercise per Week: Not on file  . Minutes of Exercise per Session: Not on file  Stress:   . Feeling of Stress : Not on file  Social Connections:   . Frequency of Communication with Friends and Family: Not on file  . Frequency of Social Gatherings with Friends and Family: Not on file  . Attends Religious Services: Not on file  . Active Member of Clubs or Organizations: Not on file  . Attends Archivist Meetings: Not on file  . Marital Status: Not on file  Intimate Partner Violence:   . Fear of Current or Ex-Partner: Not on file  . Emotionally Abused: Not on file  . Physically Abused: Not on file  . Sexually Abused: Not on file    FAMILY HISTORY: Family History  Problem Relation Age of Onset  . Heart disease Father   . Hypertension Father   . COPD Father   . Colon cancer Maternal Grandmother   . Heart attack Paternal Grandfather     ALLERGIES:  is allergic to glucotrol [glipizide].  MEDICATIONS:  Current Outpatient Medications  Medication Sig Dispense Refill  . aspirin EC 81 MG tablet Take 81 mg by mouth daily.    . [place]-Glucosamine-Vit D (OSTEO BI-FLEX ONE PER DAY) TABS Take 1 tablet by mouth daily.    . Glucose Blood (ACCU-CHEK  AVIVA PLUS VI) by In Vitro route.    Marland Kitchen JARDIANCE 10 MG TABS tablet TAKE 1 TABLET BY MOUTH DAILY (Patient taking differently: Take 10 mg by mouth daily. ) 30 tablet 2  . lisinopril (PRINIVIL,ZESTRIL) 10 MG tablet TAKE 1 TABLET(10 MG) BY MOUTH DAILY (Patient taking differently: Take 10 mg by mouth daily. ) 90 tablet 1  . metFORMIN (GLUCOPHAGE-XR) 500 MG 24 hr tablet Take 3 pills by mouth daily for 4-5 days, then increase to 4 pills daily (if tolerated) (Patient taking differently: Take 2,000 mg by mouth daily with breakfast. ) 360 tablet 0  . Multiple Vitamin (MULTIVITAMIN) tablet Take 1 tablet by mouth daily.    . Omega-3 Fatty Acids (FISH OIL) 1200 MG CAPS Take 1,200 mg by mouth daily.     . Omeprazole (PRILOSEC PO) Take 20 mg by mouth daily as needed (  heart burn).     Marland Kitchen OZEMPIC, 0.25 OR 0.5 MG/DOSE, 2 MG/1.5ML SOPN once a week.     . pioglitazone (ACTOS) 45 MG tablet TAKE 1 TABLET(45 MG) BY MOUTH DAILY (Patient taking differently: Take 22.5 mg by mouth daily. ) 90 tablet 1  . prochlorperazine (COMPAZINE) 10 MG tablet Take 1 tablet (10 mg total) by mouth every 6 (six) hours as needed for nausea or vomiting. 15 tablet 0  . rosuvastatin (CRESTOR) 10 MG tablet Take 1 tablet (10 mg total) by mouth at bedtime. 90 tablet 1  . amLODipine (NORVASC) 2.5 MG tablet Take 1 tablet (2.5 mg total) by mouth daily. (Patient not taking: Reported on 11/24/2019) 30 tablet 3  . clobetasol cream (TEMOVATE) 6.29 % Apply 1 application topically 2 (two) times daily. To affected skin; too strong for face, groin, underarms (Patient not taking: Reported on 07/17/2019) 30 g 2  . diclofenac Sodium (VOLTAREN) 1 % GEL Apply 2 g topically 4 (four) times daily. (Patient not taking: Reported on 11/24/2019) 100 g 1  . loperamide (IMODIUM) 2 MG capsule Take 1 capsule (2 mg total) by mouth See admin instructions. With onset of loose stool, take 4mg  followed by 2mg  every 2 hours until 12 hours have passed without loose bowel movement. Maximum:  16 mg/day (Patient not taking: Reported on 12/15/2019) 60 capsule 0   No current facility-administered medications for this visit.     PHYSICAL EXAMINATION: ECOG PERFORMANCE STATUS: 0 - Asymptomatic Vitals:   01/26/20 0910  BP: 123/78  Pulse: 85  Resp: 18  Temp: (!) 96 F (35.6 C)   Filed Weights   01/26/20 0910  Weight: 211 lb (95.7 kg)    Physical Exam Constitutional:      General: He is not in acute distress. HENT:     Head: Normocephalic and atraumatic.  Eyes:     General: No scleral icterus.    Pupils: Pupils are equal, round, and reactive to light.  Cardiovascular:     Rate and Rhythm: Normal rate and regular rhythm.     Heart sounds: Normal heart sounds.  Pulmonary:     Effort: Pulmonary effort is normal. No respiratory distress.     Breath sounds: No wheezing.  Abdominal:     General: Bowel sounds are normal. There is no distension.     Palpations: Abdomen is soft. There is no mass.     Tenderness: There is no abdominal tenderness.  Musculoskeletal:        General: No deformity. Normal range of motion.     Cervical back: Normal range of motion and neck supple.  Skin:    General: Skin is warm and dry.     Findings: No erythema or rash.  Neurological:     Mental Status: He is alert and oriented to person, place, and time. Mental status is at baseline.     Cranial Nerves: No cranial nerve deficit.     Coordination: Coordination normal.  Psychiatric:        Mood and Affect: Mood normal.     LABORATORY DATA:  I have reviewed the data as listed Lab Results  Component Value Date   WBC 8.1 01/26/2020   HGB 13.9 01/26/2020   HCT 41.1 01/26/2020   MCV 90.5 01/26/2020   PLT 191 01/26/2020   Recent Labs    12/04/19 1117 12/25/19 1031 12/25/19 1125 12/25/19 1125 01/05/20 0840 01/05/20 0840 01/12/20 1105 01/20/20 1109 01/26/20 0829  NA  --  134*  --   --  134*  --   --   --  135  K  --  4.7  --   --  4.4  --   --   --  4.6  CL  --  104  --   --   101  --   --   --  102  CO2  --  24  --   --  27  --   --   --  24  GLUCOSE  --  98  --   --  94  --   --   --  96  BUN  --  24*  --   --  26*  --   --   --  23*  CREATININE  --  1.04  --   --  1.20  --   --   --  1.10  CALCIUM  --  8.8*  --   --  9.1  --   --   --  9.2  GFRNONAA  --  >60  --   --  >60  --   --   --  >60  GFRAA  --  >60  --   --  >60  --   --   --  >60  PROT   < > 6.8 6.6   < > 6.9   < > 6.8 7.2 6.6  ALBUMIN   < > 4.0 4.1   < > 4.2   < > 4.0 4.3 3.9  AST   < > 24 25   < > 23   < > 30 148* 183*  ALT   < > 22 21   < > 21   < > 45* 238* 336*  ALKPHOS   < > 46 44   < > 50   < > 60 71 60  BILITOT   < > 0.8 0.7   < > 0.7   < > 0.7 1.2 0.9  BILIDIR   < >  --  0.1  --   --   --  0.2 0.2  --   IBILI   < >  --  0.6  --   --   --  0.5 1.0*  --    < > = values in this interval not displayed.   Iron/TIBC/Ferritin/ %Sat    Component Value Date/Time   IRON 118 09/25/2019 1115   TIBC 454 (H) 09/25/2019 1115   FERRITIN 236 09/25/2019 1115   IRONPCTSAT 26 09/25/2019 1115      RADIOGRAPHIC STUDIES: I have personally reviewed the radiological images as listed and agreed with the findings in the report.  No results found.    ASSESSMENT & PLAN:  1. Clear cell carcinoma of right kidney ([place])   2. 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 Axitinib was held last week due to transaminitis. Labs are reviewed and discussed with patient. Further worsening of transaminitis.  Continue to hold off Keytruda today.  Discontinue axitinib.  I will obtain CT chest abdomen pelvis to evaluate treatment response. Continue monitor liver function.  We spent sufficient time to discuss many aspect of care, questions were answered to patient's satisfaction. All questions were answered. The patient knows to call the clinic with any problems questions or concerns.  Earlie Server, MD, PhD Hematology Oncology [facility] at [facility] Pager- [phone] 01/26/2020

## 2020-01-29 ENCOUNTER — Ambulatory Visit
Admission: RE | Admit: 2020-01-29 | Discharge: 2020-01-29 | Disposition: A | Discharge status: Home / Self Care | Payer: BC Managed Care – PPO | Source: Ambulatory Visit | Attending: Oncology | Admitting: Oncology

## 2020-01-29 ENCOUNTER — Other Ambulatory Visit: Payer: Self-pay

## 2020-01-29 DIAGNOSIS — C641 Malignant neoplasm of right kidney, except renal pelvis: Secondary | ICD-10-CM | POA: Insufficient documentation

## 2020-01-29 MED ORDER — IOHEXOL 300 MG/ML  SOLN
100.0000 mL | Freq: Once | INTRAMUSCULAR | Status: AC | PRN
  Administered 2020-01-29: 100 mL via INTRAVENOUS

## 2020-02-10 ENCOUNTER — Encounter: Payer: Self-pay | Admitting: Oncology

## 2020-02-11 ENCOUNTER — Other Ambulatory Visit: Payer: Self-pay

## 2020-02-11 DIAGNOSIS — C641 Malignant neoplasm of right kidney, except renal pelvis: Secondary | ICD-10-CM

## 2020-02-11 NOTE — Telephone Encounter (Signed)
Done.. Pt will RTC on 02/12/20 @ 11:00 for a repeat lab draw.

## 2020-02-12 ENCOUNTER — Other Ambulatory Visit: Payer: Self-pay

## 2020-02-12 ENCOUNTER — Telehealth: Payer: Self-pay

## 2020-02-12 ENCOUNTER — Inpatient Hospital Stay: Payer: BC Managed Care – PPO

## 2020-02-12 DIAGNOSIS — C641 Malignant neoplasm of right kidney, except renal pelvis: Secondary | ICD-10-CM

## 2020-02-12 DIAGNOSIS — R7401 Elevation of levels of liver transaminase levels: Secondary | ICD-10-CM

## 2020-02-12 LAB — HEPATIC FUNCTION PANEL
ALT: 266 U/L — ABNORMAL HIGH (ref 0–44)
AST: 130 U/L — ABNORMAL HIGH (ref 15–41)
Albumin: 4.1 g/dL (ref 3.5–5.0)
Alkaline Phosphatase: 66 U/L (ref 38–126)
Bilirubin, Direct: 0.3 mg/dL — ABNORMAL HIGH (ref 0.0–0.2)
Indirect Bilirubin: 0.9 mg/dL (ref 0.3–0.9)
Total Bilirubin: 1.2 mg/dL (ref 0.3–1.2)
Total Protein: 6.7 g/dL (ref 6.5–8.1)

## 2020-02-12 NOTE — Telephone Encounter (Signed)
Done. Labs has been sched as requested

## 2020-02-12 NOTE — Telephone Encounter (Signed)
MyChart message sent to patient informing him of results.

## 2020-02-12 NOTE — Telephone Encounter (Signed)
-----   Message from Earlie Server, MD sent at 02/12/2020 12:33 PM EDT ----- Please arrange him to do weekly LFT x 2. Thanks. Let him know that result is better but I will wait until it is further better to resume treatment.

## 2020-02-16 ENCOUNTER — Telehealth: Payer: Self-pay | Admitting: *Deleted

## 2020-02-16 ENCOUNTER — Encounter: Payer: Self-pay | Admitting: Oncology

## 2020-02-16 NOTE — Telephone Encounter (Signed)
NCM Desiree with [employer] called stating to please call her for any needs with this patient.

## 2020-02-19 ENCOUNTER — Other Ambulatory Visit: Payer: Self-pay

## 2020-02-19 ENCOUNTER — Inpatient Hospital Stay: Payer: BC Managed Care – PPO | Attending: Oncology

## 2020-02-19 DIAGNOSIS — C641 Malignant neoplasm of right kidney, except renal pelvis: Secondary | ICD-10-CM

## 2020-02-19 LAB — HEPATIC FUNCTION PANEL
ALT: 203 U/L — ABNORMAL HIGH (ref 0–44)
AST: 99 U/L — ABNORMAL HIGH (ref 15–41)
Albumin: 4 g/dL (ref 3.5–5.0)
Alkaline Phosphatase: 64 U/L (ref 38–126)
Bilirubin, Direct: 0.2 mg/dL (ref 0.0–0.2)
Indirect Bilirubin: 0.9 mg/dL (ref 0.3–0.9)
Total Bilirubin: 1.1 mg/dL (ref 0.3–1.2)
Total Protein: 6.8 g/dL (ref 6.5–8.1)

## 2020-02-26 ENCOUNTER — Other Ambulatory Visit: Payer: BC Managed Care – PPO

## 2020-02-26 ENCOUNTER — Other Ambulatory Visit: Payer: Self-pay

## 2020-02-26 DIAGNOSIS — C641 Malignant neoplasm of right kidney, except renal pelvis: Secondary | ICD-10-CM

## 2020-02-27 ENCOUNTER — Inpatient Hospital Stay: Payer: BC Managed Care – PPO

## 2020-02-27 ENCOUNTER — Other Ambulatory Visit: Payer: Self-pay

## 2020-02-27 DIAGNOSIS — C641 Malignant neoplasm of right kidney, except renal pelvis: Secondary | ICD-10-CM

## 2020-02-27 LAB — HEPATIC FUNCTION PANEL
ALT: 127 U/L — ABNORMAL HIGH (ref 0–44)
AST: 67 U/L — ABNORMAL HIGH (ref 15–41)
Albumin: 4.2 g/dL (ref 3.5–5.0)
Alkaline Phosphatase: 57 U/L (ref 38–126)
Bilirubin, Direct: 0.2 mg/dL (ref 0.0–0.2)
Indirect Bilirubin: 0.8 mg/dL (ref 0.3–0.9)
Total Bilirubin: 1 mg/dL (ref 0.3–1.2)
Total Protein: 6.9 g/dL (ref 6.5–8.1)

## 2020-03-03 ENCOUNTER — Telehealth: Payer: Self-pay

## 2020-03-03 NOTE — Telephone Encounter (Signed)
-----   Message from Earlie Server, MD sent at 03/03/2020  8:14 AM EDT ----- Please arrange him to have lab md [place] in 1 week or so.  Thanks.

## 2020-03-03 NOTE — Telephone Encounter (Signed)
Done.. Pt is aware of his sched. 11/2 appt for lab/MD/keytruda

## 2020-03-03 NOTE — Telephone Encounter (Signed)
Please schedule and inform pt with appt details.

## 2020-03-16 ENCOUNTER — Inpatient Hospital Stay: Payer: BC Managed Care – PPO | Attending: Oncology

## 2020-03-16 ENCOUNTER — Other Ambulatory Visit: Payer: Self-pay

## 2020-03-16 ENCOUNTER — Encounter: Payer: Self-pay | Admitting: Oncology

## 2020-03-16 ENCOUNTER — Inpatient Hospital Stay (HOSPITAL_BASED_OUTPATIENT_CLINIC_OR_DEPARTMENT_OTHER): Payer: BC Managed Care – PPO | Admitting: Oncology

## 2020-03-16 ENCOUNTER — Inpatient Hospital Stay: Payer: BC Managed Care – PPO

## 2020-03-16 VITALS — BP 129/84 | HR 84 | Temp 96.6°F | Resp 16 | Wt 208.8 lb

## 2020-03-16 DIAGNOSIS — Z7984 Long term (current) use of oral hypoglycemic drugs: Secondary | ICD-10-CM | POA: Insufficient documentation

## 2020-03-16 DIAGNOSIS — E041 Nontoxic single thyroid nodule: Secondary | ICD-10-CM | POA: Diagnosis not present

## 2020-03-16 DIAGNOSIS — E785 Hyperlipidemia, unspecified: Secondary | ICD-10-CM | POA: Diagnosis not present

## 2020-03-16 DIAGNOSIS — C7800 Secondary malignant neoplasm of unspecified lung: Secondary | ICD-10-CM | POA: Diagnosis not present

## 2020-03-16 DIAGNOSIS — Z5112 Encounter for antineoplastic immunotherapy: Secondary | ICD-10-CM

## 2020-03-16 DIAGNOSIS — I10 Essential (primary) hypertension: Secondary | ICD-10-CM | POA: Diagnosis not present

## 2020-03-16 DIAGNOSIS — Z5111 Encounter for antineoplastic chemotherapy: Secondary | ICD-10-CM | POA: Insufficient documentation

## 2020-03-16 DIAGNOSIS — Z7982 Long term (current) use of aspirin: Secondary | ICD-10-CM | POA: Diagnosis not present

## 2020-03-16 DIAGNOSIS — Z87891 Personal history of nicotine dependence: Secondary | ICD-10-CM | POA: Insufficient documentation

## 2020-03-16 DIAGNOSIS — C641 Malignant neoplasm of right kidney, except renal pelvis: Secondary | ICD-10-CM | POA: Diagnosis not present

## 2020-03-16 DIAGNOSIS — K219 Gastro-esophageal reflux disease without esophagitis: Secondary | ICD-10-CM | POA: Diagnosis not present

## 2020-03-16 DIAGNOSIS — Z79899 Other long term (current) drug therapy: Secondary | ICD-10-CM | POA: Insufficient documentation

## 2020-03-16 DIAGNOSIS — Z791 Long term (current) use of non-steroidal anti-inflammatories (NSAID): Secondary | ICD-10-CM | POA: Diagnosis not present

## 2020-03-16 DIAGNOSIS — C7911 Secondary malignant neoplasm of bladder: Secondary | ICD-10-CM | POA: Diagnosis not present

## 2020-03-16 DIAGNOSIS — Z905 Acquired absence of kidney: Secondary | ICD-10-CM | POA: Diagnosis not present

## 2020-03-16 DIAGNOSIS — L409 Psoriasis, unspecified: Secondary | ICD-10-CM | POA: Insufficient documentation

## 2020-03-16 DIAGNOSIS — E119 Type 2 diabetes mellitus without complications: Secondary | ICD-10-CM | POA: Diagnosis not present

## 2020-03-16 DIAGNOSIS — K5730 Diverticulosis of large intestine without perforation or abscess without bleeding: Secondary | ICD-10-CM | POA: Insufficient documentation

## 2020-03-16 DIAGNOSIS — R7401 Elevation of levels of liver transaminase levels: Secondary | ICD-10-CM | POA: Insufficient documentation

## 2020-03-16 LAB — CBC WITH DIFFERENTIAL/PLATELET
Abs Immature Granulocytes: 0.04 10*3/uL (ref 0.00–0.07)
Basophils Absolute: 0 10*3/uL (ref 0.0–0.1)
Basophils Relative: 1 %
Eosinophils Absolute: 0.4 10*3/uL (ref 0.0–0.5)
Eosinophils Relative: 5 %
HCT: 44.7 % (ref 39.0–52.0)
Hemoglobin: 15.1 g/dL (ref 13.0–17.0)
Immature Granulocytes: 1 %
Lymphocytes Relative: 30 %
Lymphs Abs: 2.3 10*3/uL (ref 0.7–4.0)
MCH: 30.6 pg (ref 26.0–34.0)
MCHC: 33.8 g/dL (ref 30.0–36.0)
MCV: 90.7 fL (ref 80.0–100.0)
Monocytes Absolute: 0.7 10*3/uL (ref 0.1–1.0)
Monocytes Relative: 9 %
Neutro Abs: 4.1 10*3/uL (ref 1.7–7.7)
Neutrophils Relative %: 54 %
Platelets: 218 10*3/uL (ref 150–400)
RBC: 4.93 MIL/uL (ref 4.22–5.81)
RDW: 12.1 % (ref 11.5–15.5)
WBC: 7.4 10*3/uL (ref 4.0–10.5)
nRBC: 0 % (ref 0.0–0.2)

## 2020-03-16 LAB — COMPREHENSIVE METABOLIC PANEL
ALT: 48 U/L — ABNORMAL HIGH (ref 0–44)
AST: 31 U/L (ref 15–41)
Albumin: 4.3 g/dL (ref 3.5–5.0)
Alkaline Phosphatase: 56 U/L (ref 38–126)
Anion gap: 6 (ref 5–15)
BUN: 20 mg/dL (ref 6–20)
CO2: 28 mmol/L (ref 22–32)
Calcium: 9.8 mg/dL (ref 8.9–10.3)
Chloride: 103 mmol/L (ref 98–111)
Creatinine, Ser: 1.15 mg/dL (ref 0.61–1.24)
GFR, Estimated: >60 mL/min (ref >60)
Glucose, Bld: 121 mg/dL — ABNORMAL HIGH (ref 70–99)
Potassium: 4.8 mmol/L (ref 3.5–5.1)
Sodium: 137 mmol/L (ref 135–145)
Total Bilirubin: 0.9 mg/dL (ref 0.3–1.2)
Total Protein: 7.3 g/dL (ref 6.5–8.1)

## 2020-03-16 LAB — TSH: TSH: 0.73 u[IU]/mL (ref 0.350–4.500)

## 2020-03-16 MED ORDER — SODIUM CHLORIDE 0.9 % IV SOLN
Freq: Once | INTRAVENOUS | Status: AC
  Filled 2020-03-16: qty 250

## 2020-03-16 MED ORDER — SODIUM CHLORIDE 0.9 % IV SOLN
200.0000 mg | Freq: Once | INTRAVENOUS | Status: AC
  Administered 2020-03-16: 200 mg via INTRAVENOUS
  Filled 2020-03-16: qty 8

## 2020-03-16 NOTE — Progress Notes (Signed)
Pt tolerated infusion well. No s/s of distress or reaction noted. Pt stable at discharge.  

## 2020-03-16 NOTE — Progress Notes (Signed)
Hematology/Oncology follow up note [facility] Telephone:([phone] Fax:([phone]   Patient Care Team: Matthew Woodard as PCP - General [name], [name], MD as PCP - Family Medicine (Family Medicine) Matthew Lye, MD as Consulting Physician (General Surgery) [name], [name], MD as Attending Physician (Family Medicine) [name], [name], MD as Consulting Physician (Dermatology)  REFERRING PROVIDER: [name], Woodard  CHIEF COMPLAINTS/REASON FOR VISIT:  Follow-up of kidney cancer  HISTORY OF PRESENTING ILLNESS:   Matthew Woodard is a  54 y.o.  male with PMH listed below was seen in consultation at the request of  Matthew Woodard  for evaluation of kidney cancer Patient's cancer history dated back to February 2020 when he developed gross hematuria with small clots and right-sided flank pain.  Patient had a CT urogram done which showed right 4 cm central enhancing renal mass with invasion into the collecting system.  X-ray of chest was performed to see complete staging which showed no concerning findings. 06/28/2018 patient underwent a cystoscopy, bladder biopsy and a right retrograde pyelogram with intraoperative interpretation, right diagnostic ureteroscopy, right renal pelvis biopsy, right ureteral stent placement.  biopsy showed atypical cell clusters, with extensive crush artifact, nondiagnostic.   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  Surgical margins negative for tumor.pT3a,Nx Patient has been on surveillance after surgery. 10/28/2018 CT abdomen pelvis with contrast showed minimal fluid and or postoperative changes within the right renal fossa.  No evidence of metastatic disease or recurrence.  Patient has left kidney lesion previously characterized as  nonenhancing cyst by multi phasic contrast-enhanced CT. Patient reports an episode of gross hematuria in early December 2020, patient is due for 63-month surveillance CT scan. 04/30/2019 CT abdomen pelvis with contrast showed high attenuation mass at the right uretero vesicle junction, with extension into the bladder.  Bilateral pulmonary nodules, most indicated for metastatic disease.   Patient underwent cystoscopy and transurethral resection of irregular bladder tumor 3 cm.  Mass appeared to emanate from the right ureteral orifice.  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 Patient was referred to me for further work-up and management.  #Staging chest CT with contrast showed numerous bilateral pulmonary nodules.  Compatible with metastatic disease. Bone scan showed no osseous metastatic disease.   #History of tobacco abuse, former smoker.  Quit in 2015.  He denies any hemoptysis, chest pain, shortness of breath today. Patient reports that hematuria has completely resolved.  Denies any pain today. He is accompanied by his wife.  #History of cutaneous psoriasis, mostly on his right hand, currently on weekly methotrexate 12.5 mg with good symptom control. #NGS: [facility] PD L1 TPS 1%  # patient was referred to ENT for evaluation of headache and sinus pressure.  He had a ENT evaluation and feels that patient does not have sinusitis.  [name] recommended MRI brain to rule out PRES syndrome.  Axitinib has been held MRI brain was done and was negative.   #  off axitinib and Keytruda since the beginning of May due to transaminitis. Patient was seen and evaluated by gastroenterology [name]. Work-up was negative. Ultrasound liver vascular Doppler is negative. Recommend observation.  INTERVAL HISTORY Matthew Woodard is a 55 y.o. male who has above history reviewed by me today presents for follow up visit for management of metastatic  RCC. Problems  and complaints are listed below: NO new complaints. No nausea vomiting diarrhea. No hematuria   Review of Systems  Constitutional: Negative for appetite change, chills, fatigue, fever and unexpected weight change.  HENT:   Negative for hearing loss and voice change.   Eyes: Negative for eye problems and icterus.  Respiratory: Negative for chest tightness, cough and shortness of breath.   Cardiovascular: Negative for chest pain and leg swelling.  Gastrointestinal: Negative for abdominal distention, abdominal pain, blood in stool and nausea.  Endocrine: Negative for hot flashes.  Genitourinary: Negative for difficulty urinating, dysuria and frequency.   Musculoskeletal: Negative for arthralgias.  Skin: Negative for itching and rash.  Neurological: Negative for extremity weakness, headaches, light-headedness and numbness.  Hematological: Negative for adenopathy. Does not bruise/bleed easily.  Psychiatric/Behavioral: Negative for confusion.    MEDICAL HISTORY:  Past Medical History:  Diagnosis Date  . Arthritis    hands, left knee  . Cancer ([place])   . Diabetes mellitus without complication ([place])   . Eczema 01/08/2017  . GERD (gastroesophageal reflux disease)   . History of kidney cancer   . Hyperlipidemia LDL goal <100 11/02/2014  . Hypertension   . Kidney stone    YEAR H/O HEMATURIA  . Psoriasis   . Wears dentures    full upper, partial lower.  Has, does not wear    SURGICAL HISTORY: Past Surgical History:  Procedure Laterality Date  . COLONOSCOPY WITH PROPOFOL N/A 02/23/2017   Procedure: COLONOSCOPY WITH PROPOFOL;  Surgeon: [name], MD;  Location: [place];  Service: Endoscopy;  Laterality: N/A;  Diabetic - oral meds  . CYST EXCISION  Aug. 2016   on back  . CYSTOSCOPY W/ RETROGRADES Right 06/28/2018   Procedure: CYSTOSCOPY WITH RETROGRADE PYELOGRAM;  Surgeon: Billey Co, MD;  Location: [facility];  Service: Urology;  Laterality: Right;  . CYSTOSCOPY W/  URETERAL STENT REMOVAL Right 07/22/2018   Procedure: CYSTOSCOPY WITH STENT REMOVAL;  Surgeon: Billey Co, MD;  Location: [facility];  Service: Urology;  Laterality: Right;  . CYSTOSCOPY WITH BIOPSY Right 06/28/2018   Procedure: CYSTOSCOPY WITH RENAL BIOPSY;  Surgeon: Billey Co, MD;  Location: [facility];  Service: Urology;  Laterality: Right;  . CYSTOSCOPY WITH URETEROSCOPY AND STENT PLACEMENT Right 06/28/2018   Procedure: CYSTOSCOPY WITH URETEROSCOPY AND STENT PLACEMENT;  Surgeon: Billey Co, MD;  Location: [facility];  Service: Urology;  Laterality: Right;  . FULGURATION OF BLADDER TUMOR N/A 06/28/2018   Procedure: CYSTOSCOPY BLADDER BIOPSY, FULGERATION OF BLADDER;  Surgeon: Billey Co, MD;  Location: [facility];  Service: Urology;  Laterality: N/A;  . KNEE SURGERY Left   . LAPAROSCOPIC NEPHRECTOMY, HAND ASSISTED Right 07/22/2018   Procedure: HAND ASSISTED LAPAROSCOPIC NEPHRECTOMY;  Surgeon: Billey Co, MD;  Location: [facility];  Service: Urology;  Laterality: Right;  . NASAL SINUS SURGERY  03/15  . POLYPECTOMY N/A 02/23/2017   Procedure: POLYPECTOMY;  Surgeon: [name], MD;  Location: [place];  Service: Endoscopy;  Laterality: N/A;  . tooth abstraction    . TRANSURETHRAL RESECTION OF BLADDER TUMOR N/A 05/12/2019   Procedure: TRANSURETHRAL RESECTION OF BLADDER TUMOR (TURBT);  Surgeon: Billey Co, MD;  Location: [facility];  Service: Urology;  Laterality: N/A;  . VARICOCELE EXCISION      SOCIAL HISTORY: Social History   Socioeconomic History  . Marital status: Married    Spouse name: Not on file  . Number of children: Not on file  .  Years of education: Not on file  . Highest education level: Not on file  Occupational History  . Not on file  Tobacco Use  . Smoking status: Former Smoker    Types: Cigarettes    Quit date: 06/15/2013    Years since quitting: 6.7  . Smokeless tobacco: Never Used  Vaping Use  . Vaping Use: Never used  Substance and  Sexual Activity  . Alcohol use: No    Alcohol/week: 0.0 standard drinks  . Drug use: No  . Sexual activity: Yes  Other Topics Concern  . Not on file  Social History Narrative  . Not on file   Social Determinants of Health   Financial Resource Strain:   . Difficulty of Paying Living Expenses: Not on file  Food Insecurity:   . Worried About Charity fundraiser in the Last Year: Not on file  . Ran Out of Food in the Last Year: Not on file  Transportation Needs:   . Lack of Transportation (Medical): Not on file  . Lack of Transportation (Non-Medical): Not on file  Physical Activity:   . Days of Exercise per Week: Not on file  . Minutes of Exercise per Session: Not on file  Stress:   . Feeling of Stress : Not on file  Social Connections:   . Frequency of Communication with Friends and Family: Not on file  . Frequency of Social Gatherings with Friends and Family: Not on file  . Attends Religious Services: Not on file  . Active Member of Clubs or Organizations: Not on file  . Attends Archivist Meetings: Not on file  . Marital Status: Not on file  Intimate Partner Violence:   . Fear of Current or Ex-Partner: Not on file  . Emotionally Abused: Not on file  . Physically Abused: Not on file  . Sexually Abused: Not on file    FAMILY HISTORY: Family History  Problem Relation Age of Onset  . Heart disease Father   . Hypertension Father   . COPD Father   . Colon cancer Maternal Grandmother   . Heart attack Paternal Grandfather     ALLERGIES:  is allergic to glucotrol [glipizide].  MEDICATIONS:  Current Outpatient Medications  Medication Sig Dispense Refill  . aspirin EC 81 MG tablet Take 81 mg by mouth daily.    . [place]-Glucosamine-Vit D (OSTEO BI-FLEX ONE PER DAY) TABS Take 1 tablet by mouth daily.    . Glucose Blood (ACCU-CHEK AVIVA PLUS VI) by In Vitro route.    Marland Kitchen JARDIANCE 10 MG TABS tablet TAKE 1 TABLET BY MOUTH DAILY (Patient taking differently: Take 10  mg by mouth daily. ) 30 tablet 2  . lisinopril (PRINIVIL,ZESTRIL) 10 MG tablet TAKE 1 TABLET(10 MG) BY MOUTH DAILY (Patient taking differently: Take 10 mg by mouth daily. ) 90 tablet 1  . metFORMIN (GLUCOPHAGE-XR) 500 MG 24 hr tablet Take 3 pills by mouth daily for 4-5 days, then increase to 4 pills daily (if tolerated) (Patient taking differently: Take 2,000 mg by mouth daily with breakfast. ) 360 tablet 0  . Multiple Vitamin (MULTIVITAMIN) tablet Take 1 tablet by mouth daily.    . Omeprazole (PRILOSEC PO) Take 20 mg by mouth daily as needed (heart burn).     Marland Kitchen OZEMPIC, 0.25 OR 0.5 MG/DOSE, 2 MG/1.5ML SOPN once a week.     . pioglitazone (ACTOS) 45 MG tablet TAKE 1 TABLET(45 MG) BY MOUTH DAILY (Patient taking differently: Take 22.5 mg by mouth daily. )  90 tablet 1  . rosuvastatin (CRESTOR) 10 MG tablet Take 1 tablet (10 mg total) by mouth at bedtime. 90 tablet 1  . amLODipine (NORVASC) 2.5 MG tablet Take 1 tablet (2.5 mg total) by mouth daily. (Patient not taking: Reported on 11/24/2019) 30 tablet 3  . clobetasol cream (TEMOVATE) 0.73 % Apply 1 application topically 2 (two) times daily. To affected skin; too strong for face, groin, underarms (Patient not taking: Reported on 07/17/2019) 30 g 2  . diclofenac Sodium (VOLTAREN) 1 % GEL Apply 2 g topically 4 (four) times daily. (Patient not taking: Reported on 11/24/2019) 100 g 1  . loperamide (IMODIUM) 2 MG capsule Take 1 capsule (2 mg total) by mouth See admin instructions. With onset of loose stool, take 4mg  followed by 2mg  every 2 hours until 12 hours have passed without loose bowel movement. Maximum: 16 mg/day (Patient not taking: Reported on 12/15/2019) 60 capsule 0  . Omega-3 Fatty Acids (FISH OIL) 1200 MG CAPS Take 1,200 mg by mouth daily.  (Patient not taking: Reported on 03/16/2020)    . prochlorperazine (COMPAZINE) 10 MG tablet Take 1 tablet (10 mg total) by mouth every 6 (six) hours as needed for nausea or vomiting. (Patient not taking: Reported on  03/16/2020) 15 tablet 0   No current facility-administered medications for this visit.     PHYSICAL EXAMINATION: ECOG PERFORMANCE STATUS: 0 - Asymptomatic Vitals:   03/16/20 0904  BP: 129/84  Pulse: 84  Resp: 16  Temp: (!) 96.6 F (35.9 C)   Filed Weights   03/16/20 0904  Weight: 208 lb 12.8 oz (94.7 kg)    Physical Exam Constitutional:      General: He is not in acute distress. HENT:     Head: Normocephalic and atraumatic.  Eyes:     General: No scleral icterus.    Pupils: Pupils are equal, round, and reactive to light.  Cardiovascular:     Rate and Rhythm: Normal rate and regular rhythm.     Heart sounds: Normal heart sounds.  Pulmonary:     Effort: Pulmonary effort is normal. No respiratory distress.     Breath sounds: No wheezing.  Abdominal:     General: Bowel sounds are normal. There is no distension.     Palpations: Abdomen is soft. There is no mass.     Tenderness: There is no abdominal tenderness.  Musculoskeletal:        General: No deformity. Normal range of motion.     Cervical back: Normal range of motion and neck supple.  Skin:    General: Skin is warm and dry.     Findings: No erythema or rash.  Neurological:     Mental Status: He is alert and oriented to person, place, and time. Mental status is at baseline.     Cranial Nerves: No cranial nerve deficit.     Coordination: Coordination normal.  Psychiatric:        Mood and Affect: Mood normal.     LABORATORY DATA:  I have reviewed the data as listed Lab Results  Component Value Date   WBC 7.4 03/16/2020   HGB 15.1 03/16/2020   HCT 44.7 03/16/2020   MCV 90.7 03/16/2020   PLT 218 03/16/2020   Recent Labs    12/25/19 1031 12/25/19 1125 01/05/20 0840 01/12/20 1105 01/20/20 1109 01/26/20 0829 02/12/20 1051 02/12/20 1051 02/19/20 1115 02/27/20 1055 03/16/20 0830  NA 134*   < > 134*  --   --  135  --   --   --   --  137  K 4.7   < > 4.4  --   --  4.6  --   --   --   --  4.8  CL 104    < > 101  --   --  102  --   --   --   --  103  CO2 24   < > 27  --   --  24  --   --   --   --  28  GLUCOSE 98   < > 94  --   --  96  --   --   --   --  121*  BUN 24*   < > 26*  --   --  23*  --   --   --   --  20  CREATININE 1.04   < > 1.20  --   --  1.10  --   --   --   --  1.15  CALCIUM 8.8*   < > 9.1  --   --  9.2  --   --   --   --  9.8  GFRNONAA >60   < > >60  --   --  >60  --   --   --   --  >60  GFRAA >60  --  >60  --   --  >60  --   --   --   --   --   PROT 6.8   < > 6.9   < >   < > 6.6 6.7   < > 6.8 6.9 7.3  ALBUMIN 4.0   < > 4.2   < >   < > 3.9 4.1   < > 4.0 4.2 4.3  AST 24   < > 23   < >   < > 183* 130*   < > 99* 67* 31  ALT 22   < > 21   < >   < > 336* 266*   < > 203* 127* 48*  ALKPHOS 46   < > 50   < >   < > 60 66   < > 64 57 56  BILITOT 0.8   < > 0.7   < >   < > 0.9 1.2   < > 1.1 1.0 0.9  BILIDIR  --    < >  --    < >   < >  --  0.3*  --  0.2 0.2  --   IBILI  --    < >  --    < >   < >  --  0.9  --  0.9 0.8  --    < > = values in this interval not displayed.   Iron/TIBC/Ferritin/ %Sat    Component Value Date/Time   IRON 118 09/25/2019 1115   TIBC 454 (H) 09/25/2019 1115   FERRITIN 236 09/25/2019 1115   IRONPCTSAT 26 09/25/2019 1115      RADIOGRAPHIC STUDIES: I have personally reviewed the radiological images as listed and agreed with the findings in the report.  CT CHEST ABDOMEN PELVIS W CONTRAST  Result Date: 01/29/2020 CLINICAL DATA:  Right nephrectomy 07/22/2018 for renal cell carcinoma. Subsequent bladder and pulmonary metastases treated with medical therapy. Restaging. EXAM: CT CHEST, ABDOMEN, AND PELVIS WITH CONTRAST TECHNIQUE: Multidetector CT imaging of the chest, abdomen and pelvis was performed following the standard protocol during bolus administration of intravenous contrast.  CONTRAST:  136mL OMNIPAQUE IOHEXOL 300 MG/ML  SOLN COMPARISON:  09/12/2019 CT chest, abdomen and pelvis. FINDINGS: CT CHEST FINDINGS Cardiovascular: Normal heart size. No significant  pericardial effusion/thickening. Left anterior descending coronary atherosclerosis. Atherosclerotic nonaneurysmal thoracic aorta. Normal caliber pulmonary arteries. No central pulmonary emboli. Mediastinum/Nodes: Subcentimeter hypodense right thyroid nodule is stable. Unremarkable esophagus. No pathologically enlarged axillary, mediastinal or hilar lymph nodes. Lungs/Pleura: No pneumothorax. No pleural effusion. No acute consolidative airspace disease or lung masses. Solid posterior left upper lobe 4 mm pulmonary nodule (series 3/image 48), stable. No new significant pulmonary nodules. Musculoskeletal:  No aggressive appearing focal osseous lesions. CT ABDOMEN PELVIS FINDINGS Hepatobiliary: Normal liver with no liver mass. Normal gallbladder with no radiopaque cholelithiasis. No biliary ductal dilatation. Pancreas: Normal, with no mass or duct dilation. Spleen: Normal size. No mass. Adrenals/Urinary Tract: Normal adrenals. Stable postsurgical changes from right total nephrectomy with no discrete mass or fluid collection in the right nephrectomy bed. No left hydronephrosis. Hypodense 2.4 cm posterior interpolar left renal cortical lesion (series 2/image 73), stable, previously characterized as nonenhancing on 06/21/2018 CT. Simple 1.7 cm posterior interpolar left renal cyst. No suspicious left renal masses. Normal bladder with no recurrent bladder mass. Stomach/Bowel: Normal non-distended stomach. Normal caliber small bowel with no small bowel wall thickening. Normal appendix. Minimal sigmoid diverticulosis with no large bowel wall thickening or significant pericolonic fat stranding. Vascular/Lymphatic: Atherosclerotic nonaneurysmal abdominal aorta. Patent portal, splenic, hepatic and left renal veins. No pathologically enlarged lymph nodes in the abdomen or pelvis. Reproductive: Mild prostatomegaly. Other: No pneumoperitoneum, ascites or focal fluid collection. Musculoskeletal: No aggressive appearing focal osseous  lesions. Mild lumbar spondylosis. IMPRESSION: 1. No evidence of local tumor recurrence in the right nephrectomy bed. 2. No evidence of new or progressive metastatic disease in the chest, abdomen or pelvis. Solitary 4 mm left upper lobe pulmonary nodule is stable. 3. Aortic Atherosclerosis (ICD10-I70.0). Electronically Signed   By: Ilona Sorrel M.D.   On: 01/29/2020 11:12      ASSESSMENT & PLAN:  1. Clear cell carcinoma of right kidney ([place])   2. Encounter for antineoplastic immunotherapy   3. 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 Cannot tolerate Axitinib despite dose reduction due to transaminitis.  Axitinib will be discontinued.  Labs are reviewed and discussed with patient. Continue Keytruda, plan to continue Keytruda as single agent for maintenance.   Transaminitis,  Trending down. Acceptable to proceed with treatment.   We spent sufficient time to discuss many aspect of care, questions were answered to patient's satisfaction. All questions were answered. The patient knows to call the clinic with any problems questions or concerns.  Earlie Server, MD, PhD Hematology Oncology [facility] at [facility] Pager- [phone] 03/16/2020

## 2020-03-16 NOTE — Progress Notes (Signed)
Patient denies new problems/concerns today.   °

## 2020-04-01 ENCOUNTER — Encounter: Payer: Self-pay | Admitting: Oncology

## 2020-04-02 ENCOUNTER — Inpatient Hospital Stay (HOSPITAL_BASED_OUTPATIENT_CLINIC_OR_DEPARTMENT_OTHER): Payer: BC Managed Care – PPO | Admitting: Oncology

## 2020-04-02 ENCOUNTER — Encounter: Payer: Self-pay | Admitting: Oncology

## 2020-04-02 ENCOUNTER — Inpatient Hospital Stay: Payer: BC Managed Care – PPO

## 2020-04-02 ENCOUNTER — Other Ambulatory Visit: Payer: Self-pay

## 2020-04-02 VITALS — BP 121/81 | HR 85 | Temp 96.5°F | Resp 18 | Wt 207.8 lb

## 2020-04-02 DIAGNOSIS — Z5112 Encounter for antineoplastic immunotherapy: Secondary | ICD-10-CM

## 2020-04-02 DIAGNOSIS — C641 Malignant neoplasm of right kidney, except renal pelvis: Secondary | ICD-10-CM

## 2020-04-02 LAB — COMPREHENSIVE METABOLIC PANEL
ALT: 31 U/L (ref 0–44)
AST: 29 U/L (ref 15–41)
Albumin: 4 g/dL (ref 3.5–5.0)
Alkaline Phosphatase: 59 U/L (ref 38–126)
Anion gap: 9 (ref 5–15)
BUN: 23 mg/dL — ABNORMAL HIGH (ref 6–20)
CO2: 23 mmol/L (ref 22–32)
Calcium: 9.3 mg/dL (ref 8.9–10.3)
Chloride: 105 mmol/L (ref 98–111)
Creatinine, Ser: 0.99 mg/dL (ref 0.61–1.24)
GFR, Estimated: >60 mL/min (ref >60)
Glucose, Bld: 103 mg/dL — ABNORMAL HIGH (ref 70–99)
Potassium: 4.2 mmol/L (ref 3.5–5.1)
Sodium: 137 mmol/L (ref 135–145)
Total Bilirubin: 0.8 mg/dL (ref 0.3–1.2)
Total Protein: 6.9 g/dL (ref 6.5–8.1)

## 2020-04-02 LAB — CBC WITH DIFFERENTIAL/PLATELET
Abs Immature Granulocytes: 0.03 10*3/uL (ref 0.00–0.07)
Basophils Absolute: 0 10*3/uL (ref 0.0–0.1)
Basophils Relative: 1 %
Eosinophils Absolute: 0.3 10*3/uL (ref 0.0–0.5)
Eosinophils Relative: 4 %
HCT: 42.9 % (ref 39.0–52.0)
Hemoglobin: 14.4 g/dL (ref 13.0–17.0)
Immature Granulocytes: 0 %
Lymphocytes Relative: 31 %
Lymphs Abs: 2.4 10*3/uL (ref 0.7–4.0)
MCH: 30.4 pg (ref 26.0–34.0)
MCHC: 33.6 g/dL (ref 30.0–36.0)
MCV: 90.5 fL (ref 80.0–100.0)
Monocytes Absolute: 0.6 10*3/uL (ref 0.1–1.0)
Monocytes Relative: 8 %
Neutro Abs: 4.3 10*3/uL (ref 1.7–7.7)
Neutrophils Relative %: 56 %
Platelets: 205 10*3/uL (ref 150–400)
RBC: 4.74 MIL/uL (ref 4.22–5.81)
RDW: 12 % (ref 11.5–15.5)
WBC: 7.7 10*3/uL (ref 4.0–10.5)
nRBC: 0 % (ref 0.0–0.2)

## 2020-04-02 LAB — TSH: TSH: 1.241 u[IU]/mL (ref 0.350–4.500)

## 2020-04-02 NOTE — Progress Notes (Signed)
Hematology/Oncology follow up note [facility] [phone] [phone]   Patient Care Team: [name], NP as PCP - General [name], [name], MD as PCP - Family Medicine (Family Medicine) [name], MD as Consulting Physician (General Surgery) [name], [name], MD as Attending Physician (Family Medicine) [name], [name], MD as Consulting Physician (Dermatology)  REFERRING PROVIDER: [name], NP  CHIEF COMPLAINTS/REASON FOR VISIT:  Follow-up of kidney cancer  HISTORY OF PRESENTING ILLNESS:   Matthew Woodard is a  54 y.o.  male with PMH listed below was seen in consultation at the request of  [name], NP  for evaluation of kidney cancer Patient's cancer history dated back to February 2020 when he developed gross hematuria with small clots and right-sided flank pain.  Patient had a CT urogram done which showed right 4 cm central enhancing renal mass with invasion into the collecting system.  X-ray of chest was performed to see complete staging which showed no concerning findings. 06/28/2018 patient underwent a cystoscopy, bladder biopsy and a right retrograde pyelogram with intraoperative interpretation, right diagnostic ureteroscopy, right renal pelvis biopsy, right ureteral stent placement.  biopsy showed atypical cell clusters, with extensive crush artifact, nondiagnostic.   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  Surgical margins negative for tumor.pT3a,Nx Patient has been on surveillance after surgery. 10/28/2018 CT abdomen pelvis with contrast showed minimal fluid and or postoperative changes within the right renal fossa.  No evidence of metastatic disease or recurrence.  Patient has left kidney lesion previously characterized as  nonenhancing cyst by multi phasic contrast-enhanced CT. Patient reports an episode of gross hematuria in early December 2020, patient is due for 2-month surveillance CT scan. 04/30/2019 CT abdomen pelvis with contrast showed high attenuation mass at the right uretero vesicle junction, with extension into the bladder.  Bilateral pulmonary nodules, most indicated for metastatic disease.   Patient underwent cystoscopy and transurethral resection of irregular bladder tumor 3 cm.  Mass appeared to emanate from the right ureteral orifice.  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 Patient was referred to me for further work-up and management.  #Staging chest CT with contrast showed numerous bilateral pulmonary nodules.  Compatible with metastatic disease. Bone scan showed no osseous metastatic disease.   #History of tobacco abuse, former smoker.  Quit in 2015.  He denies any hemoptysis, chest pain, shortness of breath today. Patient reports that hematuria has completely resolved.  Denies any pain today. He is accompanied by his wife.  #History of cutaneous psoriasis, mostly on his right hand, currently on weekly methotrexate 12.5 mg with good symptom control. #NGS: [facility] PD L1 TPS 1%  # patient was referred to ENT for evaluation of headache and sinus pressure.  He had a ENT evaluation and feels that patient does not have sinusitis.  [name] recommended MRI brain to rule out PRES syndrome.  Axitinib has been held MRI brain was done and was negative.   #09/2019 off axitinib and Keytruda since the beginning of May due to transaminitis. Patient was seen and evaluated by gastroenterology [name]. Work-up was negative. Ultrasound liver vascular Doppler is negative. Recommend observation. #11/24/2019, resumed on Keytruda every 3 weeks #01/05/2020, axitinib was resumed 3 mg #01/20/2020, axitinib was discontinued due to recurrence of transaminitis .   Beryle Flock was temporarily held. #03/16/2020,  resumed on Keytruda every 3 weeks. INTERVAL HISTORY Matthew Woodard is a 54 y.o. male who has above history reviewed by me today presents for follow up visit for management of metastatic RCC. Problems and complaints are listed below: Patient has no new complaints.  Denies any cough, chest pain, shortness of breath nausea vomiting diarrhea.  Denies hematuria   Review of Systems  Constitutional: Negative for appetite change, chills, fatigue, fever and unexpected weight change.  HENT:   Negative for hearing loss and voice change.   Eyes: Negative for eye problems and icterus.  Respiratory: Negative for chest tightness, cough and shortness of breath.   Cardiovascular: Negative for chest pain and leg swelling.  Gastrointestinal: Negative for abdominal distention, abdominal pain, blood in stool and nausea.  Endocrine: Negative for hot flashes.  Genitourinary: Negative for difficulty urinating, dysuria and frequency.   Musculoskeletal: Negative for arthralgias.  Skin: Negative for itching and rash.  Neurological: Negative for extremity weakness, headaches, light-headedness and numbness.  Hematological: Negative for adenopathy. Does not bruise/bleed easily.  Psychiatric/Behavioral: Negative for confusion.    MEDICAL HISTORY:  Past Medical History:  Diagnosis Date  . Arthritis    hands, left knee  . Cancer ([place])   . Diabetes mellitus without complication ([place])   . Eczema 01/08/2017  . GERD (gastroesophageal reflux disease)   . History of kidney cancer   . Hyperlipidemia LDL goal <100 11/02/2014  . Hypertension   . Kidney stone    YEAR H/O HEMATURIA  . Psoriasis   . Wears dentures    full upper, partial lower.  Has, does not wear    SURGICAL HISTORY: Past Surgical History:  Procedure Laterality Date  . COLONOSCOPY WITH PROPOFOL N/A 02/23/2017   Procedure: COLONOSCOPY WITH PROPOFOL;  Surgeon: [name], MD;  Location: [place];  Service: Endoscopy;  Laterality: N/A;  Diabetic - oral meds  . CYST EXCISION  Aug. 2016   on back  . CYSTOSCOPY W/ RETROGRADES Right 06/28/2018   Procedure: CYSTOSCOPY WITH RETROGRADE PYELOGRAM;  Surgeon: Billey Co, MD;  Location: [facility];  Service: Urology;  Laterality: Right;  . CYSTOSCOPY W/ URETERAL STENT REMOVAL Right 07/22/2018   Procedure: CYSTOSCOPY WITH STENT REMOVAL;  Surgeon: Billey Co, MD;  Location: [facility];  Service: Urology;  Laterality: Right;  . CYSTOSCOPY WITH BIOPSY Right 06/28/2018   Procedure: CYSTOSCOPY WITH RENAL BIOPSY;  Surgeon: Billey Co, MD;  Location: [facility];  Service: Urology;  Laterality: Right;  . CYSTOSCOPY WITH URETEROSCOPY AND STENT PLACEMENT Right 06/28/2018   Procedure: CYSTOSCOPY WITH URETEROSCOPY AND STENT PLACEMENT;  Surgeon: Billey Co, MD;  Location: [facility];  Service: Urology;  Laterality: Right;  . FULGURATION OF BLADDER TUMOR N/A 06/28/2018   Procedure: CYSTOSCOPY BLADDER BIOPSY, FULGERATION OF BLADDER;  Surgeon: Billey Co, MD;  Location: [facility];  Service: Urology;  Laterality: N/A;  . KNEE SURGERY Left   . LAPAROSCOPIC NEPHRECTOMY, HAND ASSISTED Right 07/22/2018   Procedure: HAND ASSISTED LAPAROSCOPIC NEPHRECTOMY;  Surgeon: Billey Co, MD;  Location: [facility];  Service: Urology;  Laterality: Right;  . NASAL SINUS SURGERY  03/15  . POLYPECTOMY N/A 02/23/2017   Procedure: POLYPECTOMY;  Surgeon: [name], MD;  Location: [place];  Service: Endoscopy;  Laterality: N/A;  . tooth abstraction    . TRANSURETHRAL RESECTION OF BLADDER TUMOR N/A 05/12/2019   Procedure: TRANSURETHRAL RESECTION OF BLADDER TUMOR (TURBT);  Surgeon: Billey Co, MD;  Location: [facility];  Service: Urology;  Laterality: N/A;  . VARICOCELE EXCISION      SOCIAL HISTORY: Social History   Socioeconomic History  . Marital status: Married    Spouse name: Not on file  . Number of children: Not on file  . Years of  education: Not on file  . Highest education level: Not on file  Occupational History  . Not on file  Tobacco Use  . Smoking status: Former Smoker    Types: Cigarettes    Quit date: 06/15/2013    Years since quitting: 6.8  . Smokeless tobacco: Never Used  Vaping Use  . Vaping Use: Never used  Substance and Sexual Activity  . Alcohol use: No    Alcohol/week: 0.0 standard drinks  . Drug use: No  . Sexual activity: Yes  Other Topics Concern  . Not on file  Social History Narrative  . Not on file   Social Determinants of Health   Financial Resource Strain:   . Difficulty of Paying Living Expenses: Not on file  Food Insecurity:   . Worried About Charity fundraiser in the Last Year: Not on file  . Ran Out of Food in the Last Year: Not on file  Transportation Needs:   . Lack of Transportation (Medical): Not on file  . Lack of Transportation (Non-Medical): Not on file  Physical Activity:   . Days of Exercise per Week: Not on file  . Minutes of Exercise per Session: Not on file  Stress:   . Feeling of Stress : Not on file  Social Connections:   . Frequency of Communication with Friends and Family: Not on file  . Frequency of Social Gatherings with Friends and Family: Not on file  . Attends Religious Services: Not on file  . Active Member of Clubs or Organizations: Not on file  . Attends Archivist Meetings: Not on file  . Marital Status: Not on file  Intimate Partner Violence:   . Fear of Current or Ex-Partner: Not on file  . Emotionally Abused: Not on file  . Physically Abused: Not on file  . Sexually Abused: Not on file    FAMILY HISTORY: Family History  Problem Relation Age of Onset  . Heart disease Father   . Hypertension Father   . COPD Father   . Colon cancer Maternal Grandmother   . Heart attack Paternal Grandfather     ALLERGIES:  is allergic to glucotrol [glipizide].  MEDICATIONS:  Current Outpatient Medications  Medication Sig Dispense Refill   . aspirin EC 81 MG tablet Take 81 mg by mouth daily.    . [place]-Glucosamine-Vit D (OSTEO BI-FLEX ONE PER DAY) TABS Take 1 tablet by mouth daily.    . Glucose Blood (ACCU-CHEK AVIVA PLUS VI) by In Vitro route.    Marland Kitchen JARDIANCE 10 MG TABS tablet TAKE 1 TABLET BY MOUTH DAILY (Patient taking differently: Take 10 mg by mouth daily. ) 30 tablet 2  . lisinopril (PRINIVIL,ZESTRIL) 10 MG tablet TAKE 1 TABLET(10 MG) BY MOUTH DAILY (Patient taking differently: Take 10 mg by mouth daily. ) 90 tablet 1  . metFORMIN (GLUCOPHAGE-XR) 500 MG 24 hr tablet Take 3 pills by mouth daily for 4-5 days, then increase to 4 pills daily (if tolerated) (Patient taking differently: Take 2,000 mg by mouth daily with breakfast. ) 360 tablet 0  . Multiple Vitamin (MULTIVITAMIN) tablet Take 1 tablet by mouth daily.    . Omega-3 Fatty Acids (FISH OIL) 1200 MG CAPS Take 1,200  mg by mouth daily.     . Omeprazole (PRILOSEC PO) Take 20 mg by mouth daily as needed (heart burn).     Marland Kitchen OZEMPIC, 0.25 OR 0.5 MG/DOSE, 2 MG/1.5ML SOPN once a week.     . rosuvastatin (CRESTOR) 10 MG tablet Take 1 tablet (10 mg total) by mouth at bedtime. 90 tablet 1  . amLODipine (NORVASC) 2.5 MG tablet Take 1 tablet (2.5 mg total) by mouth daily. (Patient not taking: Reported on 11/24/2019) 30 tablet 3  . clobetasol cream (TEMOVATE) 4.40 % Apply 1 application topically 2 (two) times daily. To affected skin; too strong for face, groin, underarms (Patient not taking: Reported on 07/17/2019) 30 g 2  . diclofenac Sodium (VOLTAREN) 1 % GEL Apply 2 g topically 4 (four) times daily. (Patient not taking: Reported on 11/24/2019) 100 g 1  . loperamide (IMODIUM) 2 MG capsule Take 1 capsule (2 mg total) by mouth See admin instructions. With onset of loose stool, take 4mg  followed by 2mg  every 2 hours until 12 hours have passed without loose bowel movement. Maximum: 16 mg/day (Patient not taking: Reported on 12/15/2019) 60 capsule 0  . prochlorperazine (COMPAZINE) 10 MG tablet  Take 1 tablet (10 mg total) by mouth every 6 (six) hours as needed for nausea or vomiting. (Patient not taking: Reported on 04/02/2020) 15 tablet 0   No current facility-administered medications for this visit.     PHYSICAL EXAMINATION: ECOG PERFORMANCE STATUS: 0 - Asymptomatic Vitals:   04/02/20 0848  BP: 121/81  Pulse: 85  Resp: 18  Temp: (!) 96.5 F (35.8 C)   Filed Weights   04/02/20 0848  Weight: 207 lb 12.8 oz (94.3 kg)    Physical Exam Constitutional:      General: He is not in acute distress. HENT:     Head: Normocephalic and atraumatic.  Eyes:     General: No scleral icterus.    Pupils: Pupils are equal, round, and reactive to light.  Cardiovascular:     Rate and Rhythm: Normal rate and regular rhythm.     Heart sounds: Normal heart sounds.  Pulmonary:     Effort: Pulmonary effort is normal. No respiratory distress.     Breath sounds: No wheezing.  Abdominal:     General: Bowel sounds are normal. There is no distension.     Palpations: Abdomen is soft. There is no mass.     Tenderness: There is no abdominal tenderness.  Musculoskeletal:        General: No deformity. Normal range of motion.     Cervical back: Normal range of motion and neck supple.  Skin:    General: Skin is warm and dry.     Findings: No erythema or rash.  Neurological:     Mental Status: He is alert and oriented to person, place, and time. Mental status is at baseline.     Cranial Nerves: No cranial nerve deficit.     Coordination: Coordination normal.  Psychiatric:        Mood and Affect: Mood normal.     LABORATORY DATA:  I have reviewed the data as listed Lab Results  Component Value Date   WBC 7.7 04/02/2020   HGB 14.4 04/02/2020   HCT 42.9 04/02/2020   MCV 90.5 04/02/2020   PLT 205 04/02/2020   Recent Labs    12/25/19 1031 12/25/19 1125 01/05/20 0840 01/12/20 1105 01/20/20 1109 01/26/20 0829 02/12/20 1051 02/12/20 1051 02/19/20 1115 02/19/20 1115 02/27/20 1055  03/16/20 0830  04/02/20 0818  NA 134*   < > 134*  --   --  135  --   --   --   --   --  137 137  K 4.7   < > 4.4  --   --  4.6  --   --   --   --   --  4.8 4.2  CL 104   < > 101  --   --  102  --   --   --   --   --  103 105  CO2 24   < > 27  --   --  24  --   --   --   --   --  28 23  GLUCOSE 98   < > 94  --   --  96  --   --   --   --   --  121* 103*  BUN 24*   < > 26*  --   --  23*  --   --   --   --   --  20 23*  CREATININE 1.04   < > 1.20  --   --  1.10  --   --   --   --   --  1.15 0.99  CALCIUM 8.8*   < > 9.1  --   --  9.2  --   --   --   --   --  9.8 9.3  GFRNONAA >60   < > >60  --   --  >60  --   --   --   --   --  >60 >60  GFRAA >60  --  >60  --   --  >60  --   --   --   --   --   --   --   PROT 6.8   < > 6.9   < >   < > 6.6 6.7   < > 6.8   < > 6.9 7.3 6.9  ALBUMIN 4.0   < > 4.2   < >   < > 3.9 4.1   < > 4.0   < > 4.2 4.3 4.0  AST 24   < > 23   < >   < > 183* 130*   < > 99*   < > 67* 31 29  ALT 22   < > 21   < >   < > 336* 266*   < > 203*   < > 127* 48* 31  ALKPHOS 46   < > 50   < >   < > 60 66   < > 64   < > 57 56 59  BILITOT 0.8   < > 0.7   < >   < > 0.9 1.2   < > 1.1   < > 1.0 0.9 0.8  BILIDIR  --    < >  --    < >   < >  --  0.3*  --  0.2  --  0.2  --   --   IBILI  --    < >  --    < >   < >  --  0.9  --  0.9  --  0.8  --   --    < > = values in this interval not displayed.   Iron/TIBC/Ferritin/ %Sat    Component  Value Date/Time   IRON 118 09/25/2019 1115   TIBC 454 (H) 09/25/2019 1115   FERRITIN 236 09/25/2019 1115   IRONPCTSAT 26 09/25/2019 1115      RADIOGRAPHIC STUDIES: I have personally reviewed the radiological images as listed and agreed with the findings in the report.  CT CHEST ABDOMEN PELVIS W CONTRAST  Result Date: 01/29/2020 CLINICAL DATA:  Right nephrectomy 07/22/2018 for renal cell carcinoma. Subsequent bladder and pulmonary metastases treated with medical therapy. Restaging. EXAM: CT CHEST, ABDOMEN, AND PELVIS WITH CONTRAST TECHNIQUE: Multidetector CT  imaging of the chest, abdomen and pelvis was performed following the standard protocol during bolus administration of intravenous contrast. CONTRAST:  132mL OMNIPAQUE IOHEXOL 300 MG/ML  SOLN COMPARISON:  09/12/2019 CT chest, abdomen and pelvis. FINDINGS: CT CHEST FINDINGS Cardiovascular: Normal heart size. No significant pericardial effusion/thickening. Left anterior descending coronary atherosclerosis. Atherosclerotic nonaneurysmal thoracic aorta. Normal caliber pulmonary arteries. No central pulmonary emboli. Mediastinum/Nodes: Subcentimeter hypodense right thyroid nodule is stable. Unremarkable esophagus. No pathologically enlarged axillary, mediastinal or hilar lymph nodes. Lungs/Pleura: No pneumothorax. No pleural effusion. No acute consolidative airspace disease or lung masses. Solid posterior left upper lobe 4 mm pulmonary nodule (series 3/image 48), stable. No new significant pulmonary nodules. Musculoskeletal:  No aggressive appearing focal osseous lesions. CT ABDOMEN PELVIS FINDINGS Hepatobiliary: Normal liver with no liver mass. Normal gallbladder with no radiopaque cholelithiasis. No biliary ductal dilatation. Pancreas: Normal, with no mass or duct dilation. Spleen: Normal size. No mass. Adrenals/Urinary Tract: Normal adrenals. Stable postsurgical changes from right total nephrectomy with no discrete mass or fluid collection in the right nephrectomy bed. No left hydronephrosis. Hypodense 2.4 cm posterior interpolar left renal cortical lesion (series 2/image 73), stable, previously characterized as nonenhancing on 06/21/2018 CT. Simple 1.7 cm posterior interpolar left renal cyst. No suspicious left renal masses. Normal bladder with no recurrent bladder mass. Stomach/Bowel: Normal non-distended stomach. Normal caliber small bowel with no small bowel wall thickening. Normal appendix. Minimal sigmoid diverticulosis with no large bowel wall thickening or significant pericolonic fat stranding.  Vascular/Lymphatic: Atherosclerotic nonaneurysmal abdominal aorta. Patent portal, splenic, hepatic and left renal veins. No pathologically enlarged lymph nodes in the abdomen or pelvis. Reproductive: Mild prostatomegaly. Other: No pneumoperitoneum, ascites or focal fluid collection. Musculoskeletal: No aggressive appearing focal osseous lesions. Mild lumbar spondylosis. IMPRESSION: 1. No evidence of local tumor recurrence in the right nephrectomy bed. 2. No evidence of new or progressive metastatic disease in the chest, abdomen or pelvis. Solitary 4 mm left upper lobe pulmonary nodule is stable. 3. Aortic Atherosclerosis (ICD10-I70.0). Electronically Signed   By: Ilona Sorrel M.D.   On: 01/29/2020 11:12      ASSESSMENT & PLAN:  1. Clear cell carcinoma of right kidney ([place])   2. 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 Labs are reviewed and discussed with patient. Continue Keytruda maintenance every 3 weeks. I will obtain CT chest abdomen pelvis surveillance scan in December.  Transaminitis, completely resolved.  We spent sufficient time to discuss many aspect of care, questions were answered to patient's satisfaction. All questions were answered. The patient knows to call the clinic with any problems questions or concerns.  Earlie Server, MD, PhD Hematology Oncology [facility] at [facility] Pager- [phone] 04/02/2020

## 2020-04-02 NOTE — Progress Notes (Signed)
Pt here for follow up. No new concerns voiced.   

## 2020-04-05 NOTE — Telephone Encounter (Signed)
Dr. Tasia Catchings, the form he is needing filled out is for a biometric wellness screening and is asking for his cholesterol levels from this year.  We have not collected this.  Do you want to order or should he contact his PCP for an annual preventive care visit?

## 2020-04-06 ENCOUNTER — Inpatient Hospital Stay: Payer: BC Managed Care – PPO

## 2020-04-06 VITALS — BP 140/84 | HR 90 | Temp 96.7°F | Resp 18

## 2020-04-06 DIAGNOSIS — C641 Malignant neoplasm of right kidney, except renal pelvis: Secondary | ICD-10-CM | POA: Diagnosis not present

## 2020-04-06 MED ORDER — SODIUM CHLORIDE 0.9 % IV SOLN
200.0000 mg | Freq: Once | INTRAVENOUS | Status: AC
  Administered 2020-04-06: 200 mg via INTRAVENOUS
  Filled 2020-04-06: qty 8

## 2020-04-06 MED ORDER — SODIUM CHLORIDE 0.9 % IV SOLN
Freq: Once | INTRAVENOUS | Status: AC
  Filled 2020-04-06: qty 250

## 2020-04-26 ENCOUNTER — Telehealth: Payer: Self-pay | Admitting: Pharmacy Technician

## 2020-04-27 ENCOUNTER — Encounter: Payer: Self-pay | Admitting: Oncology

## 2020-04-27 ENCOUNTER — Inpatient Hospital Stay: Payer: BC Managed Care – PPO

## 2020-04-27 ENCOUNTER — Inpatient Hospital Stay (HOSPITAL_BASED_OUTPATIENT_CLINIC_OR_DEPARTMENT_OTHER): Payer: BC Managed Care – PPO | Admitting: Oncology

## 2020-04-27 ENCOUNTER — Other Ambulatory Visit: Payer: Self-pay

## 2020-04-27 ENCOUNTER — Inpatient Hospital Stay: Payer: BC Managed Care – PPO | Attending: Oncology

## 2020-04-27 VITALS — BP 124/75 | HR 83 | Temp 97.4°F | Resp 18 | Wt 204.1 lb

## 2020-04-27 DIAGNOSIS — Z87442 Personal history of urinary calculi: Secondary | ICD-10-CM | POA: Insufficient documentation

## 2020-04-27 DIAGNOSIS — Z905 Acquired absence of kidney: Secondary | ICD-10-CM | POA: Insufficient documentation

## 2020-04-27 DIAGNOSIS — E119 Type 2 diabetes mellitus without complications: Secondary | ICD-10-CM | POA: Insufficient documentation

## 2020-04-27 DIAGNOSIS — N400 Enlarged prostate without lower urinary tract symptoms: Secondary | ICD-10-CM | POA: Diagnosis not present

## 2020-04-27 DIAGNOSIS — E785 Hyperlipidemia, unspecified: Secondary | ICD-10-CM | POA: Diagnosis not present

## 2020-04-27 DIAGNOSIS — C641 Malignant neoplasm of right kidney, except renal pelvis: Secondary | ICD-10-CM

## 2020-04-27 DIAGNOSIS — K219 Gastro-esophageal reflux disease without esophagitis: Secondary | ICD-10-CM | POA: Diagnosis not present

## 2020-04-27 DIAGNOSIS — Z7984 Long term (current) use of oral hypoglycemic drugs: Secondary | ICD-10-CM | POA: Diagnosis not present

## 2020-04-27 DIAGNOSIS — I10 Essential (primary) hypertension: Secondary | ICD-10-CM | POA: Insufficient documentation

## 2020-04-27 DIAGNOSIS — Z5112 Encounter for antineoplastic immunotherapy: Secondary | ICD-10-CM

## 2020-04-27 DIAGNOSIS — Z800 Family history of malignant neoplasm of digestive organs: Secondary | ICD-10-CM | POA: Diagnosis not present

## 2020-04-27 DIAGNOSIS — C7800 Secondary malignant neoplasm of unspecified lung: Secondary | ICD-10-CM | POA: Diagnosis not present

## 2020-04-27 DIAGNOSIS — Z7982 Long term (current) use of aspirin: Secondary | ICD-10-CM | POA: Insufficient documentation

## 2020-04-27 DIAGNOSIS — E041 Nontoxic single thyroid nodule: Secondary | ICD-10-CM | POA: Insufficient documentation

## 2020-04-27 DIAGNOSIS — Z87891 Personal history of nicotine dependence: Secondary | ICD-10-CM | POA: Insufficient documentation

## 2020-04-27 DIAGNOSIS — Z8249 Family history of ischemic heart disease and other diseases of the circulatory system: Secondary | ICD-10-CM | POA: Diagnosis not present

## 2020-04-27 DIAGNOSIS — Z79899 Other long term (current) drug therapy: Secondary | ICD-10-CM | POA: Insufficient documentation

## 2020-04-27 LAB — COMPREHENSIVE METABOLIC PANEL
ALT: 29 U/L (ref 0–44)
AST: 29 U/L (ref 15–41)
Albumin: 4.2 g/dL (ref 3.5–5.0)
Alkaline Phosphatase: 54 U/L (ref 38–126)
Anion gap: 8 (ref 5–15)
BUN: 22 mg/dL — ABNORMAL HIGH (ref 6–20)
CO2: 27 mmol/L (ref 22–32)
Calcium: 9.3 mg/dL (ref 8.9–10.3)
Chloride: 101 mmol/L (ref 98–111)
Creatinine, Ser: 0.99 mg/dL (ref 0.61–1.24)
GFR, Estimated: >60 mL/min (ref >60)
Glucose, Bld: 107 mg/dL — ABNORMAL HIGH (ref 70–99)
Potassium: 4.4 mmol/L (ref 3.5–5.1)
Sodium: 136 mmol/L (ref 135–145)
Total Bilirubin: 0.8 mg/dL (ref 0.3–1.2)
Total Protein: 7 g/dL (ref 6.5–8.1)

## 2020-04-27 LAB — CBC WITH DIFFERENTIAL/PLATELET
Abs Immature Granulocytes: 0.04 10*3/uL (ref 0.00–0.07)
Basophils Absolute: 0 10*3/uL (ref 0.0–0.1)
Basophils Relative: 1 %
Eosinophils Absolute: 0.2 10*3/uL (ref 0.0–0.5)
Eosinophils Relative: 3 %
HCT: 44 % (ref 39.0–52.0)
Hemoglobin: 14.8 g/dL (ref 13.0–17.0)
Immature Granulocytes: 1 %
Lymphocytes Relative: 28 %
Lymphs Abs: 2.1 10*3/uL (ref 0.7–4.0)
MCH: 30.5 pg (ref 26.0–34.0)
MCHC: 33.6 g/dL (ref 30.0–36.0)
MCV: 90.5 fL (ref 80.0–100.0)
Monocytes Absolute: 0.6 10*3/uL (ref 0.1–1.0)
Monocytes Relative: 8 %
Neutro Abs: 4.7 10*3/uL (ref 1.7–7.7)
Neutrophils Relative %: 59 %
Platelets: 190 10*3/uL (ref 150–400)
RBC: 4.86 MIL/uL (ref 4.22–5.81)
RDW: 12.4 % (ref 11.5–15.5)
WBC: 7.7 10*3/uL (ref 4.0–10.5)
nRBC: 0 % (ref 0.0–0.2)

## 2020-04-27 LAB — TSH: TSH: 0.711 u[IU]/mL (ref 0.350–4.500)

## 2020-04-27 MED ORDER — SODIUM CHLORIDE 0.9 % IV SOLN
200.0000 mg | Freq: Once | INTRAVENOUS | Status: AC
  Administered 2020-04-27: 200 mg via INTRAVENOUS
  Filled 2020-04-27: qty 8

## 2020-04-27 MED ORDER — SODIUM CHLORIDE 0.9% FLUSH
10.0000 mL | INTRAVENOUS | Status: DC | PRN
  Filled 2020-04-27: qty 10

## 2020-04-27 MED ORDER — SODIUM CHLORIDE 0.9 % IV SOLN
Freq: Once | INTRAVENOUS | Status: AC
  Filled 2020-04-27: qty 250

## 2020-04-27 NOTE — Progress Notes (Signed)
Hematology/Oncology follow up note [facility] [phone] [phone]   Patient Care Team: [name], NP as PCP - General [name], [name], MD as PCP - Family Medicine (Family Medicine) [name], MD as Consulting Physician (General Surgery) [name], [name], MD as Attending Physician (Family Medicine) [name], [name], MD as Consulting Physician (Dermatology)  REFERRING PROVIDER: [name], NP  CHIEF COMPLAINTS/REASON FOR VISIT:  Follow-up of kidney cancer  HISTORY OF PRESENTING ILLNESS:   Matthew Woodard is a  54 y.o.  male with PMH listed below was seen in consultation at the request of  [name], NP  for evaluation of kidney cancer Patient's cancer history dated back to February 2020 when he developed gross hematuria with small clots and right-sided flank pain.  Patient had a CT urogram done which showed right 4 cm central enhancing renal mass with invasion into the collecting system.  X-ray of chest was performed to see complete staging which showed no concerning findings. 06/28/2018 patient underwent a cystoscopy, bladder biopsy and a right retrograde pyelogram with intraoperative interpretation, right diagnostic ureteroscopy, right renal pelvis biopsy, right ureteral stent placement.  biopsy showed atypical cell clusters, with extensive crush artifact, nondiagnostic.   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  Surgical margins negative for tumor.pT3a,Nx Patient has been on surveillance after surgery. 10/28/2018 CT abdomen pelvis with contrast showed minimal fluid and or postoperative changes within the right renal fossa.  No evidence of metastatic disease or recurrence.  Patient has left kidney lesion previously characterized as  nonenhancing cyst by multi phasic contrast-enhanced CT. Patient reports an episode of gross hematuria in early December 2020, patient is due for 2-month surveillance CT scan. 04/30/2019 CT abdomen pelvis with contrast showed high attenuation mass at the right uretero vesicle junction, with extension into the bladder.  Bilateral pulmonary nodules, most indicated for metastatic disease.   Patient underwent cystoscopy and transurethral resection of irregular bladder tumor 3 cm.  Mass appeared to emanate from the right ureteral orifice.  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 Patient was referred to me for further work-up and management.  #Staging chest CT with contrast showed numerous bilateral pulmonary nodules.  Compatible with metastatic disease. Bone scan showed no osseous metastatic disease.   #History of tobacco abuse, former smoker.  Quit in 2015.  He denies any hemoptysis, chest pain, shortness of breath today. Patient reports that hematuria has completely resolved.  Denies any pain today. He is accompanied by his wife.  #History of cutaneous psoriasis, mostly on his right hand, currently on weekly methotrexate 12.5 mg with good symptom control. #NGS: [facility] PD L1 TPS 1%  # patient was referred to ENT for evaluation of headache and sinus pressure.  He had a ENT evaluation and feels that patient does not have sinusitis.  [name] recommended MRI brain to rule out PRES syndrome.  Axitinib has been held MRI brain was done and was negative.   #09/2019 off axitinib and Keytruda since the beginning of May due to transaminitis. Patient was seen and evaluated by gastroenterology [name]. Work-up was negative. Ultrasound liver vascular Doppler is negative. Recommend observation. #11/24/2019, resumed on Keytruda every 3 weeks #01/05/2020, axitinib was resumed 3 mg #01/20/2020, axitinib was discontinued due to recurrence of transaminitis .   Beryle Flock was temporarily held. #03/16/2020,  resumed on Keytruda every 3 weeks. INTERVAL HISTORY Matthew Woodard is a 54 y.o. male who has above history reviewed by me today presents for follow up visit for management of metastatic RCC. Problems and complaints are listed below: Patient tolerates well.  No nausea vomiting diarrhea, shortness of breath, skin rash.   Review of Systems  Constitutional: Negative for appetite change, chills, fatigue, fever and unexpected weight change.  HENT:   Negative for hearing loss and voice change.   Eyes: Negative for eye problems and icterus.  Respiratory: Negative for chest tightness, cough and shortness of breath.   Cardiovascular: Negative for chest pain and leg swelling.  Gastrointestinal: Negative for abdominal distention, abdominal pain, blood in stool and nausea.  Endocrine: Negative for hot flashes.  Genitourinary: Negative for difficulty urinating, dysuria and frequency.   Musculoskeletal: Negative for arthralgias.  Skin: Negative for itching and rash.  Neurological: Negative for extremity weakness, headaches, light-headedness and numbness.  Hematological: Negative for adenopathy. Does not bruise/bleed easily.  Psychiatric/Behavioral: Negative for confusion.    MEDICAL HISTORY:  Past Medical History:  Diagnosis Date  . Arthritis    hands, left knee  . Cancer ([place])   . Diabetes mellitus without complication ([place])   . Eczema 01/08/2017  . GERD (gastroesophageal reflux disease)   . History of kidney cancer   . Hyperlipidemia LDL goal <100 11/02/2014  . Hypertension   . Kidney stone    YEAR H/O HEMATURIA  . Psoriasis   . Wears dentures    full upper, partial lower.  Has, does not wear    SURGICAL HISTORY: Past Surgical History:  Procedure Laterality Date  . COLONOSCOPY WITH PROPOFOL N/A 02/23/2017   Procedure: COLONOSCOPY WITH PROPOFOL;  Surgeon: [name], MD;  Location: [place];  Service: Endoscopy;  Laterality:  N/A;  Diabetic - oral meds  . CYST EXCISION  Aug. 2016   on back  . CYSTOSCOPY W/ RETROGRADES Right 06/28/2018   Procedure: CYSTOSCOPY WITH RETROGRADE PYELOGRAM;  Surgeon: Billey Co, MD;  Location: [facility];  Service: Urology;  Laterality: Right;  . CYSTOSCOPY W/ URETERAL STENT REMOVAL Right 07/22/2018   Procedure: CYSTOSCOPY WITH STENT REMOVAL;  Surgeon: Billey Co, MD;  Location: [facility];  Service: Urology;  Laterality: Right;  . CYSTOSCOPY WITH BIOPSY Right 06/28/2018   Procedure: CYSTOSCOPY WITH RENAL BIOPSY;  Surgeon: Billey Co, MD;  Location: [facility];  Service: Urology;  Laterality: Right;  . CYSTOSCOPY WITH URETEROSCOPY AND STENT PLACEMENT Right 06/28/2018   Procedure: CYSTOSCOPY WITH URETEROSCOPY AND STENT PLACEMENT;  Surgeon: Billey Co, MD;  Location: [facility];  Service: Urology;  Laterality: Right;  . FULGURATION OF BLADDER TUMOR N/A 06/28/2018   Procedure: CYSTOSCOPY BLADDER BIOPSY, FULGERATION OF BLADDER;  Surgeon: Billey Co, MD;  Location: [facility];  Service: Urology;  Laterality: N/A;  . KNEE SURGERY Left   . LAPAROSCOPIC NEPHRECTOMY, HAND ASSISTED Right 07/22/2018   Procedure: HAND ASSISTED LAPAROSCOPIC NEPHRECTOMY;  Surgeon: Billey Co, MD;  Location: [facility];  Service: Urology;  Laterality: Right;  . NASAL SINUS SURGERY  03/15  . POLYPECTOMY N/A 02/23/2017   Procedure: POLYPECTOMY;  Surgeon: [name], MD;  Location: [facility];  Service: Endoscopy;  Laterality: N/A;  . tooth abstraction    . TRANSURETHRAL RESECTION OF BLADDER TUMOR N/A 05/12/2019   Procedure: TRANSURETHRAL RESECTION OF BLADDER TUMOR (TURBT);  Surgeon: Billey Co, MD;  Location: [facility];  Service: Urology;  Laterality: N/A;  .  VARICOCELE EXCISION      SOCIAL HISTORY: Social History   Socioeconomic History  . Marital status: Married    Spouse name: Not on file  . Number of children: Not on file  . Years of education: Not on file  . Highest  education level: Not on file  Occupational History  . Not on file  Tobacco Use  . Smoking status: Former Smoker    Types: Cigarettes    Quit date: 06/15/2013    Years since quitting: 6.8  . Smokeless tobacco: Never Used  Vaping Use  . Vaping Use: Never used  Substance and Sexual Activity  . Alcohol use: No    Alcohol/week: 0.0 standard drinks  . Drug use: No  . Sexual activity: Yes  Other Topics Concern  . Not on file  Social History Narrative  . Not on file   Social Determinants of Health   Financial Resource Strain: Not on file  Food Insecurity: Not on file  Transportation Needs: Not on file  Physical Activity: Not on file  Stress: Not on file  Social Connections: Not on file  Intimate Partner Violence: Not on file    FAMILY HISTORY: Family History  Problem Relation Age of Onset  . Heart disease Father   . Hypertension Father   . COPD Father   . Colon cancer Maternal Grandmother   . Heart attack Paternal Grandfather     ALLERGIES:  is allergic to glucotrol [glipizide].  MEDICATIONS:  Current Outpatient Medications  Medication Sig Dispense Refill  . aspirin EC 81 MG tablet Take 81 mg by mouth daily.    . [place]-Glucosamine-Vit D (OSTEO BI-FLEX ONE PER DAY) TABS Take 1 tablet by mouth daily.    . Glucose Blood (ACCU-CHEK AVIVA PLUS VI) by In Vitro route.    Marland Kitchen JARDIANCE 10 MG TABS tablet TAKE 1 TABLET BY MOUTH DAILY (Patient taking differently: Take 10 mg by mouth daily.) 30 tablet 2  . lisinopril (PRINIVIL,ZESTRIL) 10 MG tablet TAKE 1 TABLET(10 MG) BY MOUTH DAILY (Patient taking differently: Take 10 mg by mouth daily.) 90 tablet 1  . metFORMIN (GLUCOPHAGE-XR) 500 MG 24 hr tablet Take 3 pills by mouth daily for 4-5 days, then increase to 4 pills daily (if tolerated) (Patient taking differently: Take 2,000 mg by mouth daily with breakfast.) 360 tablet 0  . Multiple Vitamin (MULTIVITAMIN) tablet Take 1 tablet by mouth daily.    . Omega-3 Fatty Acids (FISH OIL) 1200  MG CAPS Take 1,200 mg by mouth daily.     . Omeprazole (PRILOSEC PO) Take 20 mg by mouth daily as needed (heart burn).     Marland Kitchen OZEMPIC, 0.25 OR 0.5 MG/DOSE, 2 MG/1.5ML SOPN once a week.     . rosuvastatin (CRESTOR) 10 MG tablet Take 1 tablet (10 mg total) by mouth at bedtime. 90 tablet 1  . amLODipine (NORVASC) 2.5 MG tablet Take 1 tablet (2.5 mg total) by mouth daily. (Patient not taking: No sig reported) 30 tablet 3  . clobetasol cream (TEMOVATE) 4.19 % Apply 1 application topically 2 (two) times daily. To affected skin; too strong for face, groin, underarms (Patient not taking: No sig reported) 30 g 2  . diclofenac Sodium (VOLTAREN) 1 % GEL Apply 2 g topically 4 (four) times daily. (Patient not taking: No sig reported) 100 g 1  . loperamide (IMODIUM) 2 MG capsule Take 1 capsule (2 mg total) by mouth See admin instructions. With onset of loose stool, take 4mg  followed by 2mg  every  2 hours until 12 hours have passed without loose bowel movement. Maximum: 16 mg/day (Patient not taking: No sig reported) 60 capsule 0  . prochlorperazine (COMPAZINE) 10 MG tablet Take 1 tablet (10 mg total) by mouth every 6 (six) hours as needed for nausea or vomiting. (Patient not taking: No sig reported) 15 tablet 0   No current facility-administered medications for this visit.   Facility-Administered Medications Ordered in Other Visits  Medication Dose Route Frequency Provider Last Rate Last Admin  . sodium chloride flush (NS) 0.9 % injection 10 mL  10 mL Intracatheter PRN Earlie Server, MD         PHYSICAL EXAMINATION: ECOG PERFORMANCE STATUS: 0 - Asymptomatic Vitals:   04/27/20 0935  BP: 124/75  Pulse: 83  Resp: 18  Temp: (!) 97.4 F (36.3 C)   Filed Weights   04/27/20 0935  Weight: 204 lb 1.6 oz (92.6 kg)    Physical Exam Constitutional:      General: He is not in acute distress. HENT:     Head: Normocephalic and atraumatic.  Eyes:     General: No scleral icterus.    Pupils: Pupils are equal,  round, and reactive to light.  Cardiovascular:     Rate and Rhythm: Normal rate and regular rhythm.     Heart sounds: Normal heart sounds.  Pulmonary:     Effort: Pulmonary effort is normal. No respiratory distress.     Breath sounds: No wheezing.  Abdominal:     General: Bowel sounds are normal. There is no distension.     Palpations: Abdomen is soft. There is no mass.     Tenderness: There is no abdominal tenderness.  Musculoskeletal:        General: No deformity. Normal range of motion.     Cervical back: Normal range of motion and neck supple.  Skin:    General: Skin is warm and dry.     Findings: No erythema or rash.  Neurological:     Mental Status: He is alert and oriented to person, place, and time. Mental status is at baseline.     Cranial Nerves: No cranial nerve deficit.     Coordination: Coordination normal.  Psychiatric:        Mood and Affect: Mood normal.     LABORATORY DATA:  I have reviewed the data as listed Lab Results  Component Value Date   WBC 7.7 04/27/2020   HGB 14.8 04/27/2020   HCT 44.0 04/27/2020   MCV 90.5 04/27/2020   PLT 190 04/27/2020   Recent Labs    12/25/19 1031 12/25/19 1125 01/05/20 0840 01/12/20 1105 01/26/20 0829 02/12/20 1051 02/19/20 1115 02/27/20 1055 03/16/20 0830 04/02/20 0818 04/27/20 0854  NA 134*  --  134*  --  135  --   --   --  137 137 136  K 4.7  --  4.4  --  4.6  --   --   --  4.8 4.2 4.4  CL 104  --  101  --  102  --   --   --  103 105 101  CO2 24  --  27  --  24  --   --   --  28 23 27   GLUCOSE 98  --  94  --  96  --   --   --  121* 103* 107*  BUN 24*  --  26*  --  23*  --   --   --  20 23* 22*  CREATININE 1.04  --  1.20  --  1.10  --   --   --  1.15 0.99 0.99  CALCIUM 8.8*  --  9.1  --  9.2  --   --   --  9.8 9.3 9.3  GFRNONAA >60  --  >60  --  >60  --   --   --  >60 >60 >60  GFRAA >60  --  >60  --  >60  --   --   --   --   --   --   PROT 6.8   < > 6.9   < > 6.6 6.7 6.8 6.9 7.3 6.9 7.0  ALBUMIN 4.0   < >  4.2   < > 3.9 4.1 4.0 4.2 4.3 4.0 4.2  AST 24   < > 23   < > 183* 130* 99* 67* 31 29 29   ALT 22   < > 21   < > 336* 266* 203* 127* 48* 31 29  ALKPHOS 46   < > 50   < > 60 66 64 57 56 59 54  BILITOT 0.8   < > 0.7   < > 0.9 1.2 1.1 1.0 0.9 0.8 0.8  BILIDIR  --    < >  --    < >  --  0.3* 0.2 0.2  --   --   --   IBILI  --    < >  --    < >  --  0.9 0.9 0.8  --   --   --    < > = values in this interval not displayed.   Iron/TIBC/Ferritin/ %Sat    Component Value Date/Time   IRON 118 09/25/2019 1115   TIBC 454 (H) 09/25/2019 1115   FERRITIN 236 09/25/2019 1115   IRONPCTSAT 26 09/25/2019 1115      RADIOGRAPHIC STUDIES: I have personally reviewed the radiological images as listed and agreed with the findings in the report.  CT CHEST ABDOMEN PELVIS W CONTRAST  Result Date: 01/29/2020 CLINICAL DATA:  Right nephrectomy 07/22/2018 for renal cell carcinoma. Subsequent bladder and pulmonary metastases treated with medical therapy. Restaging. EXAM: CT CHEST, ABDOMEN, AND PELVIS WITH CONTRAST TECHNIQUE: Multidetector CT imaging of the chest, abdomen and pelvis was performed following the standard protocol during bolus administration of intravenous contrast. CONTRAST:  136mL OMNIPAQUE IOHEXOL 300 MG/ML  SOLN COMPARISON:  09/12/2019 CT chest, abdomen and pelvis. FINDINGS: CT CHEST FINDINGS Cardiovascular: Normal heart size. No significant pericardial effusion/thickening. Left anterior descending coronary atherosclerosis. Atherosclerotic nonaneurysmal thoracic aorta. Normal caliber pulmonary arteries. No central pulmonary emboli. Mediastinum/Nodes: Subcentimeter hypodense right thyroid nodule is stable. Unremarkable esophagus. No pathologically enlarged axillary, mediastinal or hilar lymph nodes. Lungs/Pleura: No pneumothorax. No pleural effusion. No acute consolidative airspace disease or lung masses. Solid posterior left upper lobe 4 mm pulmonary nodule (series 3/image 48), stable. No new significant  pulmonary nodules. Musculoskeletal:  No aggressive appearing focal osseous lesions. CT ABDOMEN PELVIS FINDINGS Hepatobiliary: Normal liver with no liver mass. Normal gallbladder with no radiopaque cholelithiasis. No biliary ductal dilatation. Pancreas: Normal, with no mass or duct dilation. Spleen: Normal size. No mass. Adrenals/Urinary Tract: Normal adrenals. Stable postsurgical changes from right total nephrectomy with no discrete mass or fluid collection in the right nephrectomy bed. No left hydronephrosis. Hypodense 2.4 cm posterior interpolar left renal cortical lesion (series 2/image 73), stable, previously characterized as nonenhancing on 06/21/2018 CT. Simple 1.7 cm posterior interpolar  left renal cyst. No suspicious left renal masses. Normal bladder with no recurrent bladder mass. Stomach/Bowel: Normal non-distended stomach. Normal caliber small bowel with no small bowel wall thickening. Normal appendix. Minimal sigmoid diverticulosis with no large bowel wall thickening or significant pericolonic fat stranding. Vascular/Lymphatic: Atherosclerotic nonaneurysmal abdominal aorta. Patent portal, splenic, hepatic and left renal veins. No pathologically enlarged lymph nodes in the abdomen or pelvis. Reproductive: Mild prostatomegaly. Other: No pneumoperitoneum, ascites or focal fluid collection. Musculoskeletal: No aggressive appearing focal osseous lesions. Mild lumbar spondylosis. IMPRESSION: 1. No evidence of local tumor recurrence in the right nephrectomy bed. 2. No evidence of new or progressive metastatic disease in the chest, abdomen or pelvis. Solitary 4 mm left upper lobe pulmonary nodule is stable. 3. Aortic Atherosclerosis (ICD10-I70.0). Electronically Signed   By: Ilona Sorrel M.D.   On: 01/29/2020 11:12      ASSESSMENT & PLAN:  1. Clear cell carcinoma of right kidney ([place])   2. 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 Labs are reviewed and discussed with patient To continue Keytruda maintenance every 3 weeks .  CT chest abdomen pelvis surveillance scan in December .  We spent sufficient time to discuss many aspect of care, questions were answered to patient's satisfaction. All questions were answered. The patient knows to call the clinic with any problems questions or concerns.  Earlie Server, MD, PhD Hematology Oncology [facility] Pager- [phone] 04/27/2020

## 2020-04-27 NOTE — Progress Notes (Signed)
Patient denies new problems/concerns today.   °

## 2020-04-27 NOTE — Telephone Encounter (Signed)
Oral Oncology Patient Advocate Encounter  Received fax notification that patients Inly[employer] authorization was expiring.  Completed paper questionnaire and faxed back to [facility] on 04/26/20.  Received PA approval on 04/26/20.    PA# [id number] Matthew Woodard is approved from 04/26/20-04/26/21.  [facility] Patient [place] Phone [phone] Fax [phone] 04/27/2020 11:12 AM

## 2020-04-27 NOTE — Progress Notes (Signed)
Keytruda well tolerated. Discharged home in stable condition. 

## 2020-05-11 ENCOUNTER — Ambulatory Visit
Admission: RE | Admit: 2020-05-11 | Discharge: 2020-05-11 | Disposition: A | Discharge status: Home / Self Care | Payer: BC Managed Care – PPO | Source: Ambulatory Visit | Attending: Oncology | Admitting: Oncology

## 2020-05-11 ENCOUNTER — Other Ambulatory Visit: Payer: Self-pay

## 2020-05-11 DIAGNOSIS — C641 Malignant neoplasm of right kidney, except renal pelvis: Secondary | ICD-10-CM | POA: Diagnosis not present

## 2020-05-11 MED ORDER — IOHEXOL 300 MG/ML  SOLN
100.0000 mL | Freq: Once | INTRAMUSCULAR | Status: AC | PRN
  Administered 2020-05-11: 100 mL via INTRAVENOUS

## 2020-05-17 ENCOUNTER — Encounter: Payer: Self-pay | Admitting: Oncology

## 2020-05-18 ENCOUNTER — Inpatient Hospital Stay: Payer: BC Managed Care – PPO

## 2020-05-18 ENCOUNTER — Inpatient Hospital Stay: Payer: BC Managed Care – PPO | Admitting: Oncology

## 2020-05-19 ENCOUNTER — Encounter: Payer: Self-pay | Admitting: Oncology

## 2020-05-24 ENCOUNTER — Other Ambulatory Visit: Payer: Self-pay

## 2020-05-24 DIAGNOSIS — C641 Malignant neoplasm of right kidney, except renal pelvis: Secondary | ICD-10-CM

## 2020-05-25 ENCOUNTER — Inpatient Hospital Stay: Payer: BC Managed Care – PPO

## 2020-05-25 ENCOUNTER — Encounter: Payer: Self-pay | Admitting: Oncology

## 2020-05-25 ENCOUNTER — Inpatient Hospital Stay: Payer: BC Managed Care – PPO | Attending: Oncology

## 2020-05-25 ENCOUNTER — Inpatient Hospital Stay (HOSPITAL_BASED_OUTPATIENT_CLINIC_OR_DEPARTMENT_OTHER): Payer: BC Managed Care – PPO | Admitting: Oncology

## 2020-05-25 VITALS — BP 127/91 | HR 90 | Temp 96.1°F | Resp 18 | Wt 202.9 lb

## 2020-05-25 DIAGNOSIS — Z7982 Long term (current) use of aspirin: Secondary | ICD-10-CM | POA: Insufficient documentation

## 2020-05-25 DIAGNOSIS — Z905 Acquired absence of kidney: Secondary | ICD-10-CM | POA: Diagnosis not present

## 2020-05-25 DIAGNOSIS — R519 Headache, unspecified: Secondary | ICD-10-CM | POA: Insufficient documentation

## 2020-05-25 DIAGNOSIS — Z87891 Personal history of nicotine dependence: Secondary | ICD-10-CM | POA: Diagnosis not present

## 2020-05-25 DIAGNOSIS — Z20822 Contact with and (suspected) exposure to covid-19: Secondary | ICD-10-CM | POA: Insufficient documentation

## 2020-05-25 DIAGNOSIS — Z5112 Encounter for antineoplastic immunotherapy: Secondary | ICD-10-CM

## 2020-05-25 DIAGNOSIS — E119 Type 2 diabetes mellitus without complications: Secondary | ICD-10-CM | POA: Insufficient documentation

## 2020-05-25 DIAGNOSIS — C641 Malignant neoplasm of right kidney, except renal pelvis: Secondary | ICD-10-CM

## 2020-05-25 DIAGNOSIS — I10 Essential (primary) hypertension: Secondary | ICD-10-CM | POA: Insufficient documentation

## 2020-05-25 DIAGNOSIS — E785 Hyperlipidemia, unspecified: Secondary | ICD-10-CM | POA: Diagnosis not present

## 2020-05-25 DIAGNOSIS — K219 Gastro-esophageal reflux disease without esophagitis: Secondary | ICD-10-CM | POA: Insufficient documentation

## 2020-05-25 DIAGNOSIS — Z79899 Other long term (current) drug therapy: Secondary | ICD-10-CM | POA: Diagnosis not present

## 2020-05-25 DIAGNOSIS — Z7984 Long term (current) use of oral hypoglycemic drugs: Secondary | ICD-10-CM | POA: Insufficient documentation

## 2020-05-25 LAB — CBC WITH DIFFERENTIAL/PLATELET
Abs Immature Granulocytes: 0.07 10*3/uL (ref 0.00–0.07)
Basophils Absolute: 0.1 10*3/uL (ref 0.0–0.1)
Basophils Relative: 1 %
Eosinophils Absolute: 0.2 10*3/uL (ref 0.0–0.5)
Eosinophils Relative: 2 %
HCT: 42.6 % (ref 39.0–52.0)
Hemoglobin: 14.7 g/dL (ref 13.0–17.0)
Immature Granulocytes: 1 %
Lymphocytes Relative: 26 %
Lymphs Abs: 2.3 10*3/uL (ref 0.7–4.0)
MCH: 30.8 pg (ref 26.0–34.0)
MCHC: 34.5 g/dL (ref 30.0–36.0)
MCV: 89.1 fL (ref 80.0–100.0)
Monocytes Absolute: 0.8 10*3/uL (ref 0.1–1.0)
Monocytes Relative: 9 %
Neutro Abs: 5.5 10*3/uL (ref 1.7–7.7)
Neutrophils Relative %: 61 %
Platelets: 228 10*3/uL (ref 150–400)
RBC: 4.78 MIL/uL (ref 4.22–5.81)
RDW: 12.1 % (ref 11.5–15.5)
WBC: 8.9 10*3/uL (ref 4.0–10.5)
nRBC: 0 % (ref 0.0–0.2)

## 2020-05-25 LAB — COMPREHENSIVE METABOLIC PANEL
ALT: 25 U/L (ref 0–44)
AST: 24 U/L (ref 15–41)
Albumin: 4.1 g/dL (ref 3.5–5.0)
Alkaline Phosphatase: 60 U/L (ref 38–126)
Anion gap: 6 (ref 5–15)
BUN: 19 mg/dL (ref 6–20)
CO2: 28 mmol/L (ref 22–32)
Calcium: 9 mg/dL (ref 8.9–10.3)
Chloride: 104 mmol/L (ref 98–111)
Creatinine, Ser: 1.07 mg/dL (ref 0.61–1.24)
GFR, Estimated: >60 mL/min (ref >60)
Glucose, Bld: 142 mg/dL — ABNORMAL HIGH (ref 70–99)
Potassium: 4.5 mmol/L (ref 3.5–5.1)
Sodium: 138 mmol/L (ref 135–145)
Total Bilirubin: 0.5 mg/dL (ref 0.3–1.2)
Total Protein: 6.9 g/dL (ref 6.5–8.1)

## 2020-05-25 LAB — TSH: TSH: 0.653 u[IU]/mL (ref 0.350–4.500)

## 2020-05-25 MED ORDER — SODIUM CHLORIDE 0.9 % IV SOLN
200.0000 mg | Freq: Once | INTRAVENOUS | Status: AC
  Administered 2020-05-25: 200 mg via INTRAVENOUS
  Filled 2020-05-25: qty 8

## 2020-05-25 MED ORDER — SODIUM CHLORIDE 0.9 % IV SOLN
Freq: Once | INTRAVENOUS | Status: AC
  Filled 2020-05-25: qty 250

## 2020-05-25 NOTE — Progress Notes (Signed)
Hematology/Oncology follow up note [facility] [phone] [phone]   Patient Care Team: [name], NP as PCP - General [name], [name], MD as PCP - Family Medicine (Family Medicine) [name], MD as Consulting Physician (General Surgery) [name], [name], MD as Attending Physician (Family Medicine) [name], [name], MD as Consulting Physician (Dermatology)  REFERRING PROVIDER: [name], NP  CHIEF COMPLAINTS/REASON FOR VISIT:  Follow-up of kidney cancer  HISTORY OF PRESENTING ILLNESS:   Matthew Woodard is a  55 y.o.  male with PMH listed below was seen in consultation at the request of  [name], NP  for evaluation of kidney cancer Patient's cancer history dated back to February 2020 when he developed gross hematuria with small clots and right-sided flank pain.  Patient had a CT urogram done which showed right 4 cm central enhancing renal mass with invasion into the collecting system.  X-ray of chest was performed to see complete staging which showed no concerning findings. 06/28/2018 patient underwent a cystoscopy, bladder biopsy and a right retrograde pyelogram with intraoperative interpretation, right diagnostic ureteroscopy, right renal pelvis biopsy, right ureteral stent placement.  biopsy showed atypical cell clusters, with extensive crush artifact, nondiagnostic.   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  Surgical margins negative for tumor.pT3a,Nx Patient has been on surveillance after surgery. 10/28/2018 CT abdomen pelvis with contrast showed minimal fluid and or postoperative changes within the right renal fossa.  No evidence of metastatic disease or recurrence.  Patient has left kidney lesion previously characterized as  nonenhancing cyst by multi phasic contrast-enhanced CT. Patient reports an episode of gross hematuria in early December 2020, patient is due for 2-month surveillance CT scan. 04/30/2019 CT abdomen pelvis with contrast showed high attenuation mass at the right uretero vesicle junction, with extension into the bladder.  Bilateral pulmonary nodules, most indicated for metastatic disease.   Patient underwent cystoscopy and transurethral resection of irregular bladder tumor 3 cm.  Mass appeared to emanate from the right ureteral orifice.  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 Patient was referred to me for further work-up and management.  #Staging chest CT with contrast showed numerous bilateral pulmonary nodules.  Compatible with metastatic disease. Bone scan showed no osseous metastatic disease.   #History of tobacco abuse, former smoker.  Quit in 2015.  He denies any hemoptysis, chest pain, shortness of breath today. Patient reports that hematuria has completely resolved.  Denies any pain today. He is accompanied by his wife.  #History of cutaneous psoriasis, mostly on his right hand, currently on weekly methotrexate 12.5 mg with good symptom control. #NGS: [facility] PD L1 TPS 1%  # patient was referred to ENT for evaluation of headache and sinus pressure.  He had a ENT evaluation and feels that patient does not have sinusitis.  [name] recommended MRI brain to rule out PRES syndrome.  Axitinib has been held MRI brain was done and was negative.   #09/2019 off axitinib and Keytruda since the beginning of May due to transaminitis. Patient was seen and evaluated by gastroenterology [name]. Work-up was negative. Ultrasound liver vascular Doppler is negative. Recommend observation. #11/24/2019, resumed on Keytruda every 3 weeks #01/05/2020, axitinib was resumed 3 mg #01/20/2020, axitinib was discontinued due to recurrence of transaminitis .   Beryle Flock was temporarily held. #03/16/2020,  resumed on Keytruda every 3 weeks. INTERVAL HISTORY Matthew Woodard is a 55 y.o. male who has above history reviewed by me today presents for follow up visit for management of metastatic RCC. Problems and complaints are listed below:  Patient has no new complaints.  He reported being feverish for a few days during Christmas time.  His family members were tested positive for COVID.  He was never tested.  He reports that all his symptoms has recovered. Today he denies any shortness of breath, cough, sore throat, fever, chills, skin rash, diarrhea, nausea vomiting.  During interval patient had surveillance CT scan done.  Review of Systems  Constitutional: Negative for appetite change, chills, fatigue, fever and unexpected weight change.  HENT:   Negative for hearing loss and voice change.   Eyes: Negative for eye problems and icterus.  Respiratory: Negative for chest tightness, cough and shortness of breath.   Cardiovascular: Negative for chest pain and leg swelling.  Gastrointestinal: Negative for abdominal distention, abdominal pain, blood in stool and nausea.  Endocrine: Negative for hot flashes.  Genitourinary: Negative for difficulty urinating, dysuria and frequency.   Musculoskeletal: Negative for arthralgias.  Skin: Negative for itching and rash.  Neurological: Negative for extremity weakness, headaches, light-headedness and numbness.  Hematological: Negative for adenopathy. Does not bruise/bleed easily.  Psychiatric/Behavioral: Negative for confusion.    MEDICAL HISTORY:  Past Medical History:  Diagnosis Date  . Arthritis    hands, left knee  . Cancer ([place])   . Diabetes mellitus without complication ([place])   . Eczema 01/08/2017  . GERD (gastroesophageal reflux disease)   . History of kidney cancer   . Hyperlipidemia LDL goal <100 11/02/2014  . Hypertension   . Kidney stone    YEAR H/O HEMATURIA  . Psoriasis   . Wears dentures     full upper, partial lower.  Has, does not wear    SURGICAL HISTORY: Past Surgical History:  Procedure Laterality Date  . COLONOSCOPY WITH PROPOFOL N/A 02/23/2017   Procedure: COLONOSCOPY WITH PROPOFOL;  Surgeon: [name], MD;  Location: [place];  Service: Endoscopy;  Laterality: N/A;  Diabetic - oral meds  . CYST EXCISION  Aug. 2016   on back  . CYSTOSCOPY W/ RETROGRADES Right 06/28/2018   Procedure: CYSTOSCOPY WITH RETROGRADE PYELOGRAM;  Surgeon: Billey Co, MD;  Location: [facility];  Service: Urology;  Laterality: Right;  . CYSTOSCOPY W/ URETERAL STENT REMOVAL Right 07/22/2018   Procedure: CYSTOSCOPY WITH STENT REMOVAL;  Surgeon: Billey Co, MD;  Location: [facility];  Service: Urology;  Laterality: Right;  . CYSTOSCOPY WITH BIOPSY Right 06/28/2018   Procedure: CYSTOSCOPY WITH RENAL BIOPSY;  Surgeon: Billey Co, MD;  Location: [facility];  Service: Urology;  Laterality: Right;  . CYSTOSCOPY WITH URETEROSCOPY AND STENT PLACEMENT Right 06/28/2018   Procedure: CYSTOSCOPY WITH URETEROSCOPY AND STENT PLACEMENT;  Surgeon: Billey Co, MD;  Location: [facility];  Service: Urology;  Laterality: Right;  . FULGURATION OF BLADDER TUMOR N/A 06/28/2018   Procedure: CYSTOSCOPY BLADDER BIOPSY, FULGERATION OF BLADDER;  Surgeon: Billey Co, MD;  Location: [facility];  Service: Urology;  Laterality: N/A;  . KNEE SURGERY Left   . LAPAROSCOPIC NEPHRECTOMY, HAND ASSISTED Right 07/22/2018   Procedure: HAND ASSISTED LAPAROSCOPIC NEPHRECTOMY;  Surgeon: Billey Co, MD;  Location: [facility];  Service: Urology;  Laterality: Right;  . NASAL SINUS SURGERY  03/15  . POLYPECTOMY N/A 02/23/2017   Procedure: POLYPECTOMY;  Surgeon: [name], MD;  Location: [place];  Service: Endoscopy;  Laterality: N/A;  . tooth abstraction    . TRANSURETHRAL RESECTION OF BLADDER TUMOR N/A 05/12/2019   Procedure: TRANSURETHRAL RESECTION OF BLADDER TUMOR (TURBT);  Surgeon: Billey Co, MD;  Location: [facility];  Service: Urology;  Laterality: N/A;  . VARICOCELE EXCISION      SOCIAL HISTORY: Social History   Socioeconomic History  . Marital status: Married    Spouse name: Not on file  . Number of children: Not on file  . Years of education: Not on file  . Highest education level: Not on file  Occupational History  . Not on file  Tobacco Use  . Smoking status: Former Smoker    Types: Cigarettes    Quit date: 06/15/2013    Years since quitting: 6.9  . Smokeless tobacco: Never Used  Vaping Use  . Vaping Use: Never used  Substance and Sexual Activity  . Alcohol use: No    Alcohol/week: 0.0 standard drinks  . Drug use: No  . Sexual activity: Yes  Other Topics Concern  . Not on file  Social History Narrative  . Not on file   Social Determinants of Health   Financial Resource Strain: Not on file  Food Insecurity: Not on file  Transportation Needs: Not on file  Physical Activity: Not on file  Stress: Not on file  Social Connections: Not on file  Intimate Partner Violence: Not on file    FAMILY HISTORY: Family History  Problem Relation Age of Onset  . Heart disease Father   . Hypertension Father   . COPD Father   . Colon cancer Maternal Grandmother   . Heart attack Paternal Grandfather     ALLERGIES:  is allergic to glucotrol [glipizide].  MEDICATIONS:  Current Outpatient Medications  Medication Sig Dispense Refill  . Glucose Blood (ACCU-CHEK AVIVA PLUS VI) by In Vitro route.    Marland Kitchen JARDIANCE 10 MG TABS tablet TAKE 1 TABLET BY MOUTH DAILY (Patient taking differently: Take 10 mg by mouth daily.) 30 tablet 2  . lisinopril (PRINIVIL,ZESTRIL) 10 MG tablet TAKE 1 TABLET(10 MG) BY MOUTH DAILY (Patient taking differently: Take 10 mg by mouth daily.) 90 tablet 1  . metFORMIN (GLUCOPHAGE-XR) 500 MG 24 hr tablet Take 3 pills by mouth daily for 4-5 days, then increase to 4 pills daily (if tolerated) (Patient taking differently: Take 2,000 mg by mouth daily  with breakfast.) 360 tablet 0  . Multiple Vitamin (MULTIVITAMIN) tablet Take 1 tablet by mouth daily.    . Omeprazole (PRILOSEC PO) Take 20 mg by mouth daily as needed (heart burn).     Marland Kitchen OZEMPIC, 0.25 OR 0.5 MG/DOSE, 2 MG/1.5ML SOPN once a week.     . rosuvastatin (CRESTOR) 10 MG tablet Take 1 tablet (10 mg total) by mouth at bedtime. 90 tablet 1  . amLODipine (NORVASC) 2.5 MG tablet Take 1 tablet (2.5 mg total) by mouth daily. (Patient not taking: No sig reported) 30 tablet 3  . aspirin EC 81 MG tablet Take 81 mg by mouth daily. (Patient not taking: Reported on 05/25/2020)    . [place]-Glucosamine-Vit D (OSTEO BI-FLEX ONE PER DAY) TABS Take 1 tablet by mouth daily. (Patient not taking: Reported on 05/25/2020)    . clobetasol cream (TEMOVATE) [id number] % Apply 1 application topically 2 (two) times daily. To affected skin; too strong for face, groin, underarms (Patient not taking: No sig reported) 30 g 2  . diclofenac Sodium (VOLTAREN) 1 % GEL Apply  2 g topically 4 (four) times daily. (Patient not taking: No sig reported) 100 g 1  . loperamide (IMODIUM) 2 MG capsule Take 1 capsule (2 mg total) by mouth See admin instructions. With onset of loose stool, take 4mg  followed by 2mg  every 2 hours until 12 hours have passed without loose bowel movement. Maximum: 16 mg/day (Patient not taking: No sig reported) 60 capsule 0  . Omega-3 Fatty Acids (FISH OIL) 1200 MG CAPS Take 1,200 mg by mouth daily.  (Patient not taking: Reported on 05/25/2020)    . prochlorperazine (COMPAZINE) 10 MG tablet Take 1 tablet (10 mg total) by mouth every 6 (six) hours as needed for nausea or vomiting. (Patient not taking: No sig reported) 15 tablet 0   No current facility-administered medications for this visit.     PHYSICAL EXAMINATION: ECOG PERFORMANCE STATUS: 0 - Asymptomatic Vitals:   05/25/20 0924  BP: (!) 127/91  Pulse: 90  Resp: 18  Temp: (!) 96.1 F (35.6 C)  SpO2: 100%   Filed Weights   05/25/20 0924  Weight: 202  lb 14.4 oz (92 kg)    Physical Exam Constitutional:      General: He is not in acute distress. HENT:     Head: Normocephalic and atraumatic.  Eyes:     General: No scleral icterus.    Pupils: Pupils are equal, round, and reactive to light.  Cardiovascular:     Rate and Rhythm: Normal rate and regular rhythm.     Heart sounds: Normal heart sounds.  Pulmonary:     Effort: Pulmonary effort is normal. No respiratory distress.     Breath sounds: No wheezing.  Abdominal:     General: Bowel sounds are normal. There is no distension.     Palpations: Abdomen is soft. There is no mass.     Tenderness: There is no abdominal tenderness.  Musculoskeletal:        General: No deformity. Normal range of motion.     Cervical back: Normal range of motion and neck supple.  Skin:    General: Skin is warm and dry.     Findings: No erythema or rash.  Neurological:     Mental Status: He is alert and oriented to person, place, and time. Mental status is at baseline.     Cranial Nerves: No cranial nerve deficit.     Coordination: Coordination normal.  Psychiatric:        Mood and Affect: Mood normal.     LABORATORY DATA:  I have reviewed the data as listed Lab Results  Component Value Date   WBC 8.9 05/25/2020   HGB 14.7 05/25/2020   HCT 42.6 05/25/2020   MCV 89.1 05/25/2020   PLT 228 05/25/2020   Recent Labs    12/25/19 1031 12/25/19 1125 01/05/20 0840 01/12/20 1105 01/26/20 0829 02/12/20 1051 02/19/20 1115 02/27/20 1055 03/16/20 0830 04/02/20 0818 04/27/20 0854 05/25/20 0850  NA 134*  --  134*  --  135  --   --   --    < > 137 136 138  K 4.7  --  4.4  --  4.6  --   --   --    < > 4.2 4.4 4.5  CL 104  --  101  --  102  --   --   --    < > 105 101 104  CO2 24  --  27  --  24  --   --   --    < >  23 27 28   GLUCOSE 98  --  94  --  96  --   --   --    < > 103* 107* 142*  BUN 24*  --  26*  --  23*  --   --   --    < > 23* 22* 19  CREATININE 1.04  --  1.20  --  1.10  --   --   --     < > 0.99 0.99 1.07  CALCIUM 8.8*  --  9.1  --  9.2  --   --   --    < > 9.3 9.3 9.0  GFRNONAA >60  --  >60  --  >60  --   --   --    < > >60 >60 >60  GFRAA >60  --  >60  --  >60  --   --   --   --   --   --   --   PROT 6.8   < > 6.9   < > 6.6 6.7 6.8 6.9   < > 6.9 7.0 6.9  ALBUMIN 4.0   < > 4.2   < > 3.9 4.1 4.0 4.2   < > 4.0 4.2 4.1  AST 24   < > 23   < > 183* 130* 99* 67*   < > 29 29 24   ALT 22   < > 21   < > 336* 266* 203* 127*   < > 31 29 25   ALKPHOS 46   < > 50   < > 60 66 64 57   < > 59 54 60  BILITOT 0.8   < > 0.7   < > 0.9 1.2 1.1 1.0   < > 0.8 0.8 0.5  BILIDIR  --    < >  --    < >  --  0.3* 0.2 0.2  --   --   --   --   IBILI  --    < >  --    < >  --  0.9 0.9 0.8  --   --   --   --    < > = values in this interval not displayed.   Iron/TIBC/Ferritin/ %Sat    Component Value Date/Time   IRON 118 09/25/2019 1115   TIBC 454 (H) 09/25/2019 1115   FERRITIN 236 09/25/2019 1115   IRONPCTSAT 26 09/25/2019 1115      RADIOGRAPHIC STUDIES: I have personally reviewed the radiological images as listed and agreed with the findings in the report.  CT CHEST ABDOMEN PELVIS W CONTRAST  Result Date: 05/11/2020 CLINICAL DATA:  Restaging metastatic renal cell carcinoma. EXAM: CT CHEST, ABDOMEN, AND PELVIS WITH CONTRAST TECHNIQUE: Multidetector CT imaging of the chest, abdomen and pelvis was performed following the standard protocol during bolus administration of intravenous contrast. CONTRAST:  155mL OMNIPAQUE IOHEXOL 300 MG/ML  SOLN COMPARISON:  01/29/2019 FINDINGS: CT CHEST FINDINGS Cardiovascular: The heart size appears within normal limits. No pericardial effusion identified. Mild aortic atherosclerosis and coronary artery calcifications. Mediastinum/Nodes: Normal appearance of the thyroid gland. The trachea appears patent and is midline. Normal appearance of the esophagus. No enlarged axillary, supraclavicular, mediastinal or hilar lymph nodes. Lungs/Pleura: No pleural effusion. Scattered  small lung nodules appear similar to the previous exam. Index nodule within the posterolateral left upper lobe measures 4 mm and is stable from the previous study. Additional small millimetric lung nodules are also unchanged.  No new suspicious lung nodules. Musculoskeletal: No chest wall mass or suspicious bone lesions identified. CT ABDOMEN PELVIS FINDINGS Hepatobiliary: No focal liver abnormality is seen. No gallstones, gallbladder wall thickening, or biliary dilatation. Pancreas: Unremarkable. No pancreatic ductal dilatation or surrounding inflammatory changes. Spleen: Normal in size without focal abnormality. Adrenals/Urinary Tract: Normal adrenal glands. Status post right nephrectomy. No signs of recurrence of tumor within the nephrectomy bed. Intermediate to high attenuating lesion arising from the posterior cortex of the left mid kidney measures 2.3 x 2.1 cm on today's exam, image 16/9. on 06/21/2018 this measured 2.4 x 1.9 cm. Simple appearing cyst within posterior cortex of the right mid kidney is unchanged measuring 1.5 cm, image 76/2. The bladder appears unremarkable. Stomach/Bowel: Stomach is normal. No dilated loops of large or small bowel. No bowel wall thickening, inflammation or distension. Vascular/Lymphatic: Aortic atherosclerosis. No enlarged abdominal or pelvic lymph nodes. Reproductive: Prostate is unremarkable. Other: No free fluid or fluid collections. Musculoskeletal: No acute or significant osseous findings. IMPRESSION: 1. Stable exam.  No signs of recurrent or metastatic disease. 2. Tiny nonspecific lung nodules are unchanged from previous exam 3.  Aortic Atherosclerosis (ICD10-I70.0). Electronically Signed   By: Kerby Moors M.D.   On: 05/11/2020 15:30      ASSESSMENT & PLAN:  1. Clear cell carcinoma of right kidney ([place])   2. 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 Surveillance CT chest  abdomen pelvis images were independently reviewed by me and discussed with patient No evidence of disease recurrence or metastatic disease. Small lung nodules are stable Labs were reviewed and discussed with the patient. Proceed with Keytruda treatment today. .  Follow-up in 3 weeks We spent sufficient time to discuss many aspect of care, questions were answered to patient's satisfaction. All questions were answered. The patient knows to call the clinic with any problems questions or concerns.  Earlie Server, MD, PhD Hematology Oncology [facility] Pager- [phone] 05/25/2020

## 2020-05-25 NOTE — Progress Notes (Signed)
Patient tolerated infusion well. Discharged home.  

## 2020-05-25 NOTE — Progress Notes (Signed)
Pt in for follow up, denies any concerns today. 

## 2020-05-28 ENCOUNTER — Encounter: Payer: Self-pay | Admitting: Oncology

## 2020-06-15 ENCOUNTER — Inpatient Hospital Stay (HOSPITAL_BASED_OUTPATIENT_CLINIC_OR_DEPARTMENT_OTHER): Payer: BC Managed Care – PPO | Admitting: Oncology

## 2020-06-15 ENCOUNTER — Encounter: Payer: Self-pay | Admitting: Oncology

## 2020-06-15 ENCOUNTER — Inpatient Hospital Stay: Payer: BC Managed Care – PPO | Attending: Oncology

## 2020-06-15 ENCOUNTER — Inpatient Hospital Stay: Payer: BC Managed Care – PPO

## 2020-06-15 VITALS — BP 138/95 | HR 80 | Temp 96.0°F | Resp 18 | Wt 208.8 lb

## 2020-06-15 VITALS — BP 145/88 | HR 74 | Resp 18

## 2020-06-15 DIAGNOSIS — K439 Ventral hernia without obstruction or gangrene: Secondary | ICD-10-CM

## 2020-06-15 DIAGNOSIS — Z5112 Encounter for antineoplastic immunotherapy: Secondary | ICD-10-CM | POA: Diagnosis not present

## 2020-06-15 DIAGNOSIS — Z7984 Long term (current) use of oral hypoglycemic drugs: Secondary | ICD-10-CM | POA: Diagnosis not present

## 2020-06-15 DIAGNOSIS — E119 Type 2 diabetes mellitus without complications: Secondary | ICD-10-CM | POA: Insufficient documentation

## 2020-06-15 DIAGNOSIS — I10 Essential (primary) hypertension: Secondary | ICD-10-CM | POA: Diagnosis not present

## 2020-06-15 DIAGNOSIS — Z79899 Other long term (current) drug therapy: Secondary | ICD-10-CM | POA: Diagnosis not present

## 2020-06-15 DIAGNOSIS — C641 Malignant neoplasm of right kidney, except renal pelvis: Secondary | ICD-10-CM

## 2020-06-15 DIAGNOSIS — M25561 Pain in right knee: Secondary | ICD-10-CM | POA: Insufficient documentation

## 2020-06-15 DIAGNOSIS — R519 Headache, unspecified: Secondary | ICD-10-CM | POA: Insufficient documentation

## 2020-06-15 DIAGNOSIS — K219 Gastro-esophageal reflux disease without esophagitis: Secondary | ICD-10-CM | POA: Insufficient documentation

## 2020-06-15 DIAGNOSIS — C7802 Secondary malignant neoplasm of left lung: Secondary | ICD-10-CM | POA: Insufficient documentation

## 2020-06-15 DIAGNOSIS — C7911 Secondary malignant neoplasm of bladder: Secondary | ICD-10-CM | POA: Diagnosis not present

## 2020-06-15 DIAGNOSIS — Z905 Acquired absence of kidney: Secondary | ICD-10-CM | POA: Insufficient documentation

## 2020-06-15 DIAGNOSIS — M25562 Pain in left knee: Secondary | ICD-10-CM | POA: Insufficient documentation

## 2020-06-15 DIAGNOSIS — C7801 Secondary malignant neoplasm of right lung: Secondary | ICD-10-CM | POA: Insufficient documentation

## 2020-06-15 DIAGNOSIS — Z87891 Personal history of nicotine dependence: Secondary | ICD-10-CM | POA: Diagnosis not present

## 2020-06-15 DIAGNOSIS — Z7982 Long term (current) use of aspirin: Secondary | ICD-10-CM | POA: Insufficient documentation

## 2020-06-15 DIAGNOSIS — E785 Hyperlipidemia, unspecified: Secondary | ICD-10-CM | POA: Insufficient documentation

## 2020-06-15 LAB — CBC WITH DIFFERENTIAL/PLATELET
Abs Immature Granulocytes: 0.07 10*3/uL (ref 0.00–0.07)
Basophils Absolute: 0 10*3/uL (ref 0.0–0.1)
Basophils Relative: 1 %
Eosinophils Absolute: 0.2 10*3/uL (ref 0.0–0.5)
Eosinophils Relative: 3 %
HCT: 44.8 % (ref 39.0–52.0)
Hemoglobin: 15 g/dL (ref 13.0–17.0)
Immature Granulocytes: 1 %
Lymphocytes Relative: 34 %
Lymphs Abs: 2.5 10*3/uL (ref 0.7–4.0)
MCH: 30.1 pg (ref 26.0–34.0)
MCHC: 33.5 g/dL (ref 30.0–36.0)
MCV: 90 fL (ref 80.0–100.0)
Monocytes Absolute: 0.7 10*3/uL (ref 0.1–1.0)
Monocytes Relative: 10 %
Neutro Abs: 3.8 10*3/uL (ref 1.7–7.7)
Neutrophils Relative %: 51 %
Platelets: 195 10*3/uL (ref 150–400)
RBC: 4.98 MIL/uL (ref 4.22–5.81)
RDW: 12.5 % (ref 11.5–15.5)
WBC: 7.4 10*3/uL (ref 4.0–10.5)
nRBC: 0 % (ref 0.0–0.2)

## 2020-06-15 LAB — COMPREHENSIVE METABOLIC PANEL
ALT: 23 U/L (ref 0–44)
AST: 21 U/L (ref 15–41)
Albumin: 4.3 g/dL (ref 3.5–5.0)
Alkaline Phosphatase: 55 U/L (ref 38–126)
Anion gap: 7 (ref 5–15)
BUN: 20 mg/dL (ref 6–20)
CO2: 29 mmol/L (ref 22–32)
Calcium: 9.4 mg/dL (ref 8.9–10.3)
Chloride: 100 mmol/L (ref 98–111)
Creatinine, Ser: 1.03 mg/dL (ref 0.61–1.24)
GFR, Estimated: >60 mL/min (ref >60)
Glucose, Bld: 129 mg/dL — ABNORMAL HIGH (ref 70–99)
Potassium: 4.4 mmol/L (ref 3.5–5.1)
Sodium: 136 mmol/L (ref 135–145)
Total Bilirubin: 0.7 mg/dL (ref 0.3–1.2)
Total Protein: 7.2 g/dL (ref 6.5–8.1)

## 2020-06-15 MED ORDER — SODIUM CHLORIDE 0.9 % IV SOLN
Freq: Once | INTRAVENOUS | Status: AC
  Filled 2020-06-15: qty 250

## 2020-06-15 MED ORDER — SODIUM CHLORIDE 0.9 % IV SOLN
200.0000 mg | Freq: Once | INTRAVENOUS | Status: AC
  Administered 2020-06-15: 200 mg via INTRAVENOUS
  Filled 2020-06-15: qty 8

## 2020-06-15 NOTE — Progress Notes (Signed)
1203- Patient tolerated treatment well. Patient stable and discharged to home at this time.

## 2020-06-15 NOTE — Progress Notes (Signed)
Hematology/Oncology follow up note [facility] [phone] [phone]   Patient Care Team: [name], NP as PCP - General [name], [name], MD as PCP - Family Medicine (Family Medicine) [name], MD as Consulting Physician (General Surgery) [name], [name], MD as Attending Physician (Family Medicine) [name], [name], MD as Consulting Physician (Dermatology)  REFERRING PROVIDER: [name], NP  CHIEF COMPLAINTS/REASON FOR VISIT:  Follow-up of kidney cancer  HISTORY OF PRESENTING ILLNESS:   Matthew Woodard is a  55 y.o.  male with PMH listed below was seen in consultation at the request of  [name], NP  for evaluation of kidney cancer Patient's cancer history dated back to February 2020 when he developed gross hematuria with small clots and right-sided flank pain.  Patient had a CT urogram done which showed right 4 cm central enhancing renal mass with invasion into the collecting system.  X-ray of chest was performed to see complete staging which showed no concerning findings. 06/28/2018 patient underwent a cystoscopy, bladder biopsy and a right retrograde pyelogram with intraoperative interpretation, right diagnostic ureteroscopy, right renal pelvis biopsy, right ureteral stent placement.  biopsy showed atypical cell clusters, with extensive crush artifact, nondiagnostic.   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  Surgical margins negative for tumor.pT3a,Nx Patient has been on surveillance after surgery. 10/28/2018 CT abdomen pelvis with contrast showed minimal fluid and or postoperative changes within the right renal fossa.  No evidence of metastatic disease or recurrence.  Patient has left kidney lesion previously characterized as  nonenhancing cyst by multi phasic contrast-enhanced CT. Patient reports an episode of gross hematuria in early December 2020, patient is due for 2-month surveillance CT scan. 04/30/2019 CT abdomen pelvis with contrast showed high attenuation mass at the right uretero vesicle junction, with extension into the bladder.  Bilateral pulmonary nodules, most indicated for metastatic disease.   Patient underwent cystoscopy and transurethral resection of irregular bladder tumor 3 cm.  Mass appeared to emanate from the right ureteral orifice.  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 Patient was referred to me for further work-up and management.  #Staging chest CT with contrast showed numerous bilateral pulmonary nodules.  Compatible with metastatic disease. Bone scan showed no osseous metastatic disease.   #History of tobacco abuse, former smoker.  Quit in 2015.  He denies any hemoptysis, chest pain, shortness of breath today. Patient reports that hematuria has completely resolved.  Denies any pain today. He is accompanied by his wife.  #History of cutaneous psoriasis, mostly on his right hand, currently on weekly methotrexate 12.5 mg with good symptom control. #NGS: [facility] PD L1 TPS 1%  # patient was referred to ENT for evaluation of headache and sinus pressure.  He had a ENT evaluation and feels that patient does not have sinusitis.  [name] recommended MRI brain to rule out PRES syndrome.  Axitinib has been held MRI brain was done and was negative.   #09/2019 off axitinib and Keytruda since the beginning of May due to transaminitis. Patient was seen and evaluated by gastroenterology [name]. Work-up was negative. Ultrasound liver vascular Doppler is negative. Recommend observation. #11/24/2019, resumed on Keytruda every 3 weeks #01/05/2020, axitinib was resumed 3 mg #01/20/2020, axitinib was discontinued due to recurrence of transaminitis .   Beryle Flock was temporarily held. #03/16/2020,  resumed on Keytruda every 3 weeks. INTERVAL HISTORY Matthew Woodard is a 55 y.o. male who has above history reviewed by me today presents for follow up visit for management of metastatic RCC. Problems and complaints are listed below:  Patient was accompanied by his wife.  Reports feeling well. Patient showed me an area on his right side of abdomen wall which is bulging when he stands.  No abdominal pain. Patient has chronic bilateral knee pain due to degenerative disease.   Review of Systems  Constitutional: Negative for appetite change, chills, fatigue, fever and unexpected weight change.  HENT:   Negative for hearing loss and voice change.   Eyes: Negative for eye problems and icterus.  Respiratory: Negative for chest tightness, cough and shortness of breath.   Cardiovascular: Negative for chest pain and leg swelling.  Gastrointestinal: Negative for abdominal distention, abdominal pain, blood in stool and nausea.  Endocrine: Negative for hot flashes.  Genitourinary: Negative for difficulty urinating, dysuria and frequency.   Musculoskeletal: Negative for arthralgias.  Skin: Negative for itching and rash.  Neurological: Negative for extremity weakness, headaches, light-headedness and numbness.  Hematological: Negative for adenopathy. Does not bruise/bleed easily.  Psychiatric/Behavioral: Negative for confusion.    MEDICAL HISTORY:  Past Medical History:  Diagnosis Date  . Arthritis    hands, left knee  . Cancer ([place])   . Diabetes mellitus without complication ([place])   . Eczema 01/08/2017  . GERD (gastroesophageal reflux disease)   . History of kidney cancer   . Hyperlipidemia LDL goal <100 11/02/2014  . Hypertension   . Kidney stone    YEAR H/O HEMATURIA  . Psoriasis   . Wears dentures    full upper, partial lower.  Has, does not wear    SURGICAL HISTORY: Past Surgical History:  Procedure Laterality Date  . COLONOSCOPY WITH  PROPOFOL N/A 02/23/2017   Procedure: COLONOSCOPY WITH PROPOFOL;  Surgeon: [name], MD;  Location: [facility];  Service: Endoscopy;  Laterality: N/A;  Diabetic - oral meds  . CYST EXCISION  Aug. 2016   on back  . CYSTOSCOPY W/ RETROGRADES Right 06/28/2018   Procedure: CYSTOSCOPY WITH RETROGRADE PYELOGRAM;  Surgeon: Billey Co, MD;  Location: [facility];  Service: Urology;  Laterality: Right;  . CYSTOSCOPY W/ URETERAL STENT REMOVAL Right 07/22/2018   Procedure: CYSTOSCOPY WITH STENT REMOVAL;  Surgeon: Billey Co, MD;  Location: [facility];  Service: Urology;  Laterality: Right;  . CYSTOSCOPY WITH BIOPSY Right 06/28/2018   Procedure: CYSTOSCOPY WITH RENAL BIOPSY;  Surgeon: Billey Co, MD;  Location: [facility];  Service: Urology;  Laterality: Right;  . CYSTOSCOPY WITH URETEROSCOPY AND STENT PLACEMENT Right 06/28/2018   Procedure: CYSTOSCOPY WITH URETEROSCOPY AND STENT PLACEMENT;  Surgeon: Billey Co, MD;  Location: [facility];  Service: Urology;  Laterality: Right;  . FULGURATION OF BLADDER TUMOR N/A 06/28/2018   Procedure: CYSTOSCOPY BLADDER BIOPSY, FULGERATION OF BLADDER;  Surgeon: Billey Co, MD;  Location: [facility];  Service: Urology;  Laterality: N/A;  . KNEE SURGERY Left   . LAPAROSCOPIC NEPHRECTOMY, HAND ASSISTED Right 07/22/2018   Procedure: HAND ASSISTED LAPAROSCOPIC NEPHRECTOMY;  Surgeon: Billey Co, MD;  Location: [facility];  Service: Urology;  Laterality: Right;  . NASAL SINUS SURGERY  03/15  . POLYPECTOMY N/A 02/23/2017   Procedure: POLYPECTOMY;  Surgeon: [name], MD;  Location: [facility];  Service: Endoscopy;  Laterality: N/A;  . tooth abstraction    . TRANSURETHRAL RESECTION OF BLADDER  TUMOR N/A 05/12/2019   Procedure: TRANSURETHRAL RESECTION OF BLADDER TUMOR (TURBT);  Surgeon: Billey Co, MD;  Location: [facility];  Service: Urology;  Laterality: N/A;  . VARICOCELE EXCISION      SOCIAL HISTORY: Social History    Socioeconomic History  . Marital status: Married    Spouse name: Not on file  . Number of children: Not on file  . Years of education: Not on file  . Highest education level: Not on file  Occupational History  . Not on file  Tobacco Use  . Smoking status: Former Smoker    Types: Cigarettes    Quit date: 06/15/2013    Years since quitting: 7.0  . Smokeless tobacco: Never Used  Vaping Use  . Vaping Use: Never used  Substance and Sexual Activity  . Alcohol use: No    Alcohol/week: 0.0 standard drinks  . Drug use: No  . Sexual activity: Yes  Other Topics Concern  . Not on file  Social History Narrative  . Not on file   Social Determinants of Health   Financial Resource Strain: Not on file  Food Insecurity: Not on file  Transportation Needs: Not on file  Physical Activity: Not on file  Stress: Not on file  Social Connections: Not on file  Intimate Partner Violence: Not on file    FAMILY HISTORY: Family History  Problem Relation Age of Onset  . Heart disease Father   . Hypertension Father   . COPD Father   . Colon cancer Maternal Grandmother   . Heart attack Paternal Grandfather     ALLERGIES:  is allergic to glucotrol [glipizide].  MEDICATIONS:  Current Outpatient Medications  Medication Sig Dispense Refill  . amLODipine (NORVASC) 2.5 MG tablet Take 1 tablet (2.5 mg total) by mouth daily. (Patient not taking: No sig reported) 30 tablet 3  . aspirin EC 81 MG tablet Take 81 mg by mouth daily. (Patient not taking: Reported on 05/25/2020)    . [place]-Glucosamine-Vit D (OSTEO BI-FLEX ONE PER DAY) TABS Take 1 tablet by mouth daily. (Patient not taking: Reported on 05/25/2020)    . clobetasol cream (TEMOVATE) [id number] % Apply 1 application topically 2 (two) times daily. To affected skin; too strong for face, groin, underarms (Patient not taking: No sig reported) 30 g 2  . diclofenac Sodium (VOLTAREN) 1 % GEL Apply 2 g topically 4 (four) times daily. (Patient not taking: No  sig reported) 100 g 1  . Glucose Blood (ACCU-CHEK AVIVA PLUS VI) by In Vitro route.    Marland Kitchen JARDIANCE 10 MG TABS tablet TAKE 1 TABLET BY MOUTH DAILY (Patient taking differently: Take 10 mg by mouth daily.) 30 tablet 2  . lisinopril (PRINIVIL,ZESTRIL) 10 MG tablet TAKE 1 TABLET(10 MG) BY MOUTH DAILY (Patient taking differently: Take 10 mg by mouth daily.) 90 tablet 1  . loperamide (IMODIUM) 2 MG capsule Take 1 capsule (2 mg total) by mouth See admin instructions. With onset of loose stool, take 4mg  followed by 2mg  every 2 hours until 12 hours have passed without loose bowel movement. Maximum: 16 mg/day (Patient not taking: No sig reported) 60 capsule 0  . metFORMIN (GLUCOPHAGE-XR) 500 MG 24 hr tablet Take 3 pills by mouth daily for 4-5 days, then increase to 4 pills daily (if tolerated) (Patient taking differently: Take 2,000 mg by mouth daily with breakfast.) 360 tablet 0  . Multiple Vitamin (MULTIVITAMIN) tablet Take 1 tablet by mouth daily.    . Omega-3 Fatty Acids (FISH OIL) 1200  MG CAPS Take 1,200 mg by mouth daily.  (Patient not taking: Reported on 05/25/2020)    . Omeprazole (PRILOSEC PO) Take 20 mg by mouth daily as needed (heart burn).     Marland Kitchen OZEMPIC, 0.25 OR 0.5 MG/DOSE, 2 MG/1.5ML SOPN once a week.     . prochlorperazine (COMPAZINE) 10 MG tablet Take 1 tablet (10 mg total) by mouth every 6 (six) hours as needed for nausea or vomiting. (Patient not taking: No sig reported) 15 tablet 0  . rosuvastatin (CRESTOR) 10 MG tablet Take 1 tablet (10 mg total) by mouth at bedtime. 90 tablet 1   No current facility-administered medications for this visit.     PHYSICAL EXAMINATION: ECOG PERFORMANCE STATUS: 0 - Asymptomatic Vitals:   06/15/20 0856  BP: (!) 138/95  Pulse: 80  Resp: 18  Temp: (!) 96 F (35.6 C)   Filed Weights   06/15/20 0856  Weight: 208 lb 12.8 oz (94.7 kg)    Physical Exam Constitutional:      General: He is not in acute distress. HENT:     Head: Normocephalic and  atraumatic.  Eyes:     General: No scleral icterus.    Pupils: Pupils are equal, round, and reactive to light.  Cardiovascular:     Rate and Rhythm: Normal rate and regular rhythm.     Heart sounds: Normal heart sounds.  Pulmonary:     Effort: Pulmonary effort is normal. No respiratory distress.     Breath sounds: No wheezing.  Abdominal:     General: Bowel sounds are normal. There is no distension.     Palpations: Abdomen is soft. There is no mass.     Tenderness: There is no abdominal tenderness.     Comments: Right anterior abdominal wall bulging, worse with Valsalva and standing position.  Musculoskeletal:        General: No deformity. Normal range of motion.     Cervical back: Normal range of motion and neck supple.  Skin:    General: Skin is warm and dry.     Findings: No erythema or rash.  Neurological:     Mental Status: He is alert and oriented to person, place, and time. Mental status is at baseline.     Cranial Nerves: No cranial nerve deficit.     Coordination: Coordination normal.  Psychiatric:        Mood and Affect: Mood normal.     LABORATORY DATA:  I have reviewed the data as listed Lab Results  Component Value Date   WBC 8.9 05/25/2020   HGB 14.7 05/25/2020   HCT 42.6 05/25/2020   MCV 89.1 05/25/2020   PLT 228 05/25/2020   Recent Labs    12/25/19 1031 12/25/19 1125 01/05/20 0840 01/12/20 1105 01/26/20 0829 02/12/20 1051 02/19/20 1115 02/27/20 1055 03/16/20 0830 04/02/20 0818 04/27/20 0854 05/25/20 0850  NA 134*  --  134*  --  135  --   --   --    < > 137 136 138  K 4.7  --  4.4  --  4.6  --   --   --    < > 4.2 4.4 4.5  CL 104  --  101  --  102  --   --   --    < > 105 101 104  CO2 24  --  27  --  24  --   --   --    < > 23 27 28  GLUCOSE 98  --  94  --  96  --   --   --    < > 103* 107* 142*  BUN 24*  --  26*  --  23*  --   --   --    < > 23* 22* 19  CREATININE 1.04  --  1.20  --  1.10  --   --   --    < > 0.99 0.99 1.07  CALCIUM 8.8*   --  9.1  --  9.2  --   --   --    < > 9.3 9.3 9.0  GFRNONAA >60  --  >60  --  >60  --   --   --    < > >60 >60 >60  GFRAA >60  --  >60  --  >60  --   --   --   --   --   --   --   PROT 6.8   < > 6.9   < > 6.6 6.7 6.8 6.9   < > 6.9 7.0 6.9  ALBUMIN 4.0   < > 4.2   < > 3.9 4.1 4.0 4.2   < > 4.0 4.2 4.1  AST 24   < > 23   < > 183* 130* 99* 67*   < > 29 29 24   ALT 22   < > 21   < > 336* 266* 203* 127*   < > 31 29 25   ALKPHOS 46   < > 50   < > 60 66 64 57   < > 59 54 60  BILITOT 0.8   < > 0.7   < > 0.9 1.2 1.1 1.0   < > 0.8 0.8 0.5  BILIDIR  --    < >  --    < >  --  0.3* 0.2 0.2  --   --   --   --   IBILI  --    < >  --    < >  --  0.9 0.9 0.8  --   --   --   --    < > = values in this interval not displayed.   Iron/TIBC/Ferritin/ %Sat    Component Value Date/Time   IRON 118 09/25/2019 1115   TIBC 454 (H) 09/25/2019 1115   FERRITIN 236 09/25/2019 1115   IRONPCTSAT 26 09/25/2019 1115      RADIOGRAPHIC STUDIES: I have personally reviewed the radiological images as listed and agreed with the findings in the report.  CT CHEST ABDOMEN PELVIS W CONTRAST  Result Date: 05/11/2020 CLINICAL DATA:  Restaging metastatic renal cell carcinoma. EXAM: CT CHEST, ABDOMEN, AND PELVIS WITH CONTRAST TECHNIQUE: Multidetector CT imaging of the chest, abdomen and pelvis was performed following the standard protocol during bolus administration of intravenous contrast. CONTRAST:  121mL OMNIPAQUE IOHEXOL 300 MG/ML  SOLN COMPARISON:  01/29/2019 FINDINGS: CT CHEST FINDINGS Cardiovascular: The heart size appears within normal limits. No pericardial effusion identified. Mild aortic atherosclerosis and coronary artery calcifications. Mediastinum/Nodes: Normal appearance of the thyroid gland. The trachea appears patent and is midline. Normal appearance of the esophagus. No enlarged axillary, supraclavicular, mediastinal or hilar lymph nodes. Lungs/Pleura: No pleural effusion. Scattered small lung nodules appear similar to  the previous exam. Index nodule within the posterolateral left upper lobe measures 4 mm and is stable from the previous study. Additional small millimetric lung nodules are also unchanged. No new suspicious lung  nodules. Musculoskeletal: No chest wall mass or suspicious bone lesions identified. CT ABDOMEN PELVIS FINDINGS Hepatobiliary: No focal liver abnormality is seen. No gallstones, gallbladder wall thickening, or biliary dilatation. Pancreas: Unremarkable. No pancreatic ductal dilatation or surrounding inflammatory changes. Spleen: Normal in size without focal abnormality. Adrenals/Urinary Tract: Normal adrenal glands. Status post right nephrectomy. No signs of recurrence of tumor within the nephrectomy bed. Intermediate to high attenuating lesion arising from the posterior cortex of the left mid kidney measures 2.3 x 2.1 cm on today's exam, image 16/9. on 06/21/2018 this measured 2.4 x 1.9 cm. Simple appearing cyst within posterior cortex of the right mid kidney is unchanged measuring 1.5 cm, image 76/2. The bladder appears unremarkable. Stomach/Bowel: Stomach is normal. No dilated loops of large or small bowel. No bowel wall thickening, inflammation or distension. Vascular/Lymphatic: Aortic atherosclerosis. No enlarged abdominal or pelvic lymph nodes. Reproductive: Prostate is unremarkable. Other: No free fluid or fluid collections. Musculoskeletal: No acute or significant osseous findings. IMPRESSION: 1. Stable exam.  No signs of recurrent or metastatic disease. 2. Tiny nonspecific lung nodules are unchanged from previous exam 3.  Aortic Atherosclerosis (ICD10-I70.0). Electronically Signed   By: Kerby Moors M.D.   On: 05/11/2020 15:30      ASSESSMENT & PLAN:  1. Clear cell carcinoma of right kidney ([place])   2. Encounter for antineoplastic immunotherapy   3. 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 Labs are reviewed and discussed  patient Counts acceptable to proceed with Keytruda maintenance today Follow-up in 3 weeks  Abdominal wall bulging, likely abdominal wall hernia. I refer patient to establish care with surgery for an evaluation.  Small nodules are stable on recent surveillance CT scan.  Attention on follow-up . Follow-up in 3 weeks We spent sufficient time to discuss many aspect of care, questions were answered to patient's satisfaction. All questions were answered. The patient knows to call the clinic with any problems questions or concerns.  Earlie Server, MD, PhD Hematology Oncology [facility] Pager- [phone] 06/15/2020

## 2020-06-15 NOTE — Progress Notes (Signed)
Patient here for follow up. Pt reports "odd shape " right flank and would like MD to assess.

## 2020-07-06 ENCOUNTER — Encounter: Payer: Self-pay | Admitting: Oncology

## 2020-07-06 ENCOUNTER — Inpatient Hospital Stay: Payer: BC Managed Care – PPO

## 2020-07-06 ENCOUNTER — Inpatient Hospital Stay (HOSPITAL_BASED_OUTPATIENT_CLINIC_OR_DEPARTMENT_OTHER): Payer: BC Managed Care – PPO | Admitting: Oncology

## 2020-07-06 VITALS — BP 137/91 | HR 85 | Temp 97.4°F | Resp 18 | Wt 209.3 lb

## 2020-07-06 DIAGNOSIS — C641 Malignant neoplasm of right kidney, except renal pelvis: Secondary | ICD-10-CM

## 2020-07-06 DIAGNOSIS — M170 Bilateral primary osteoarthritis of knee: Secondary | ICD-10-CM

## 2020-07-06 DIAGNOSIS — Z5112 Encounter for antineoplastic immunotherapy: Secondary | ICD-10-CM | POA: Diagnosis not present

## 2020-07-06 DIAGNOSIS — K439 Ventral hernia without obstruction or gangrene: Secondary | ICD-10-CM | POA: Diagnosis not present

## 2020-07-06 LAB — CBC WITH DIFFERENTIAL/PLATELET
Abs Immature Granulocytes: 0.05 10*3/uL (ref 0.00–0.07)
Basophils Absolute: 0.1 10*3/uL (ref 0.0–0.1)
Basophils Relative: 1 %
Eosinophils Absolute: 0.2 10*3/uL (ref 0.0–0.5)
Eosinophils Relative: 3 %
HCT: 45.5 % (ref 39.0–52.0)
Hemoglobin: 15.2 g/dL (ref 13.0–17.0)
Immature Granulocytes: 1 %
Lymphocytes Relative: 23 %
Lymphs Abs: 1.8 10*3/uL (ref 0.7–4.0)
MCH: 30.8 pg (ref 26.0–34.0)
MCHC: 33.4 g/dL (ref 30.0–36.0)
MCV: 92.1 fL (ref 80.0–100.0)
Monocytes Absolute: 0.6 10*3/uL (ref 0.1–1.0)
Monocytes Relative: 8 %
Neutro Abs: 5.1 10*3/uL (ref 1.7–7.7)
Neutrophils Relative %: 64 %
Platelets: 204 10*3/uL (ref 150–400)
RBC: 4.94 MIL/uL (ref 4.22–5.81)
RDW: 12.4 % (ref 11.5–15.5)
WBC: 7.8 10*3/uL (ref 4.0–10.5)
nRBC: 0 % (ref 0.0–0.2)

## 2020-07-06 LAB — COMPREHENSIVE METABOLIC PANEL
ALT: 23 U/L (ref 0–44)
AST: 23 U/L (ref 15–41)
Albumin: 4.3 g/dL (ref 3.5–5.0)
Alkaline Phosphatase: 58 U/L (ref 38–126)
Anion gap: 10 (ref 5–15)
BUN: 21 mg/dL — ABNORMAL HIGH (ref 6–20)
CO2: 23 mmol/L (ref 22–32)
Calcium: 9.3 mg/dL (ref 8.9–10.3)
Chloride: 103 mmol/L (ref 98–111)
Creatinine, Ser: 1.13 mg/dL (ref 0.61–1.24)
GFR, Estimated: >60 mL/min (ref >60)
Glucose, Bld: 127 mg/dL — ABNORMAL HIGH (ref 70–99)
Potassium: 4.6 mmol/L (ref 3.5–5.1)
Sodium: 136 mmol/L (ref 135–145)
Total Bilirubin: 0.9 mg/dL (ref 0.3–1.2)
Total Protein: 7.1 g/dL (ref 6.5–8.1)

## 2020-07-06 LAB — TSH: TSH: 0.449 u[IU]/mL (ref 0.350–4.500)

## 2020-07-06 MED ORDER — SODIUM CHLORIDE 0.9 % IV SOLN
200.0000 mg | Freq: Once | INTRAVENOUS | Status: AC
  Administered 2020-07-06: 200 mg via INTRAVENOUS
  Filled 2020-07-06: qty 8

## 2020-07-06 MED ORDER — SODIUM CHLORIDE 0.9 % IV SOLN
Freq: Once | INTRAVENOUS | Status: AC
  Filled 2020-07-06: qty 250

## 2020-07-06 NOTE — Progress Notes (Signed)
Hematology/Oncology follow up note [facility] [phone] [phone]   Patient Care Team: [name], NP as PCP - General [name], [name], MD as PCP - Family Medicine (Family Medicine) [name], MD as Consulting Physician (General Surgery) [name], [name], MD as Attending Physician (Family Medicine) [name], [name], MD as Consulting Physician (Dermatology)  REFERRING PROVIDER: [name], NP  CHIEF COMPLAINTS/REASON FOR VISIT:  Follow-up of kidney cancer  HISTORY OF PRESENTING ILLNESS:   Matthew Woodard is a  55 y.o.  male with PMH listed below was seen in consultation at the request of  [name], NP  for evaluation of kidney cancer Patient's cancer history dated back to February 2020 when he developed gross hematuria with small clots and right-sided flank pain.  Patient had a CT urogram done which showed right 4 cm central enhancing renal mass with invasion into the collecting system.  X-ray of chest was performed to see complete staging which showed no concerning findings. 06/28/2018 patient underwent a cystoscopy, bladder biopsy and a right retrograde pyelogram with intraoperative interpretation, right diagnostic ureteroscopy, right renal pelvis biopsy, right ureteral stent placement.  biopsy showed atypical cell clusters, with extensive crush artifact, nondiagnostic.   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  Surgical margins negative for tumor.pT3a,Nx Patient has been on surveillance after surgery. 10/28/2018 CT abdomen pelvis with contrast showed minimal fluid and or postoperative changes within the right renal fossa.  No evidence of metastatic disease or recurrence.  Patient has left kidney lesion previously characterized as  nonenhancing cyst by multi phasic contrast-enhanced CT. Patient reports an episode of gross hematuria in early December 2020, patient is due for 2-month surveillance CT scan. 04/30/2019 CT abdomen pelvis with contrast showed high attenuation mass at the right uretero vesicle junction, with extension into the bladder.  Bilateral pulmonary nodules, most indicated for metastatic disease.   Patient underwent cystoscopy and transurethral resection of irregular bladder tumor 3 cm.  Mass appeared to emanate from the right ureteral orifice.  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 Patient was referred to me for further work-up and management.  #Staging chest CT with contrast showed numerous bilateral pulmonary nodules.  Compatible with metastatic disease. Bone scan showed no osseous metastatic disease.   #History of tobacco abuse, former smoker.  Quit in 2015.  He denies any hemoptysis, chest pain, shortness of breath today. Patient reports that hematuria has completely resolved.  Denies any pain today. He is accompanied by his wife.  #History of cutaneous psoriasis, mostly on his right hand, currently on weekly methotrexate 12.5 mg with good symptom control. #NGS: [facility] PD L1 TPS 1%  # patient was referred to ENT for evaluation of headache and sinus pressure.  He had a ENT evaluation and feels that patient does not have sinusitis.  [name] recommended MRI brain to rule out PRES syndrome.  Axitinib has been held MRI brain was done and was negative.   #09/2019 off axitinib and Keytruda since the beginning of May due to transaminitis. Patient was seen and evaluated by gastroenterology [name]. Work-up was negative. Ultrasound liver vascular Doppler is negative. Recommend observation. #11/24/2019, resumed on Keytruda every 3 weeks #01/05/2020, axitinib was resumed 3 mg #01/20/2020, axitinib was discontinued due to recurrence of transaminitis .   Beryle Flock was temporarily held. #03/16/2020,  resumed on Keytruda every 3 weeks. INTERVAL HISTORY Matthew Woodard Woodard is a 55 y.o. male who has above history reviewed by me today presents for follow up visit for management of metastatic RCC. Problems and complaints are listed below:  Patient is doing well.  No new complaints.  He has chronic bilateral knee pain due to degenerative disease. There is plan for him to have elective knee replacement in March 2022   Review of Systems  Constitutional: Negative for appetite change, chills, fatigue, fever and unexpected weight change.  HENT:   Negative for hearing loss and voice change.   Eyes: Negative for eye problems and icterus.  Respiratory: Negative for chest tightness, cough and shortness of breath.   Cardiovascular: Negative for chest pain and leg swelling.  Gastrointestinal: Negative for abdominal distention, abdominal pain, blood in stool and nausea.  Endocrine: Negative for hot flashes.  Genitourinary: Negative for difficulty urinating, dysuria and frequency.   Musculoskeletal: Positive for arthralgias.  Skin: Negative for itching and rash.  Neurological: Negative for extremity weakness, headaches, light-headedness and numbness.  Hematological: Negative for adenopathy. Does not bruise/bleed easily.  Psychiatric/Behavioral: Negative for confusion.    MEDICAL HISTORY:  Past Medical History:  Diagnosis Date  . Arthritis    hands, left knee  . Cancer ([place])   . Diabetes mellitus without complication ([place])   . Eczema 01/08/2017  . GERD (gastroesophageal reflux disease)   . History of kidney cancer   . Hyperlipidemia LDL goal <100 11/02/2014  . Hypertension   . Kidney stone    YEAR H/O HEMATURIA  . Psoriasis   . Wears dentures    full upper, partial lower.  Has, does not wear    SURGICAL HISTORY: Past Surgical History:  Procedure Laterality Date  . COLONOSCOPY WITH PROPOFOL N/A 02/23/2017   Procedure: COLONOSCOPY WITH PROPOFOL;   Surgeon: [name], MD;  Location: [place];  Service: Endoscopy;  Laterality: N/A;  Diabetic - oral meds  . CYST EXCISION  Aug. 2016   on back  . CYSTOSCOPY W/ RETROGRADES Right 06/28/2018   Procedure: CYSTOSCOPY WITH RETROGRADE PYELOGRAM;  Surgeon: Billey Co, MD;  Location: [facility];  Service: Urology;  Laterality: Right;  . CYSTOSCOPY W/ URETERAL STENT REMOVAL Right 07/22/2018   Procedure: CYSTOSCOPY WITH STENT REMOVAL;  Surgeon: Billey Co, MD;  Location: [facility];  Service: Urology;  Laterality: Right;  . CYSTOSCOPY WITH BIOPSY Right 06/28/2018   Procedure: CYSTOSCOPY WITH RENAL BIOPSY;  Surgeon: Billey Co, MD;  Location: [facility];  Service: Urology;  Laterality: Right;  . CYSTOSCOPY WITH URETEROSCOPY AND STENT PLACEMENT Right 06/28/2018   Procedure: CYSTOSCOPY WITH URETEROSCOPY AND STENT PLACEMENT;  Surgeon: Billey Co, MD;  Location: [facility];  Service: Urology;  Laterality: Right;  . FULGURATION OF BLADDER TUMOR N/A 06/28/2018   Procedure: CYSTOSCOPY BLADDER BIOPSY, FULGERATION OF BLADDER;  Surgeon: Billey Co, MD;  Location: [facility];  Service: Urology;  Laterality: N/A;  . KNEE SURGERY Left   . LAPAROSCOPIC NEPHRECTOMY, HAND ASSISTED Right 07/22/2018   Procedure: HAND ASSISTED LAPAROSCOPIC NEPHRECTOMY;  Surgeon: Billey Co, MD;  Location: [facility];  Service: Urology;  Laterality: Right;  . NASAL SINUS SURGERY  03/15  . POLYPECTOMY N/A 02/23/2017   Procedure: POLYPECTOMY;  Surgeon: [name], MD;  Location: [place];  Service: Endoscopy;  Laterality: N/A;  . tooth abstraction    . TRANSURETHRAL RESECTION OF BLADDER TUMOR N/A 05/12/2019   Procedure: TRANSURETHRAL RESECTION OF BLADDER  TUMOR (TURBT);  Surgeon: Billey Co, MD;  Location: [facility];  Service: Urology;  Laterality: N/A;  . VARICOCELE EXCISION      SOCIAL HISTORY: Social History   Socioeconomic History  . Marital status: Married    Spouse name: Not on file   . Number of children: Not on file  . Years of education: Not on file  . Highest education level: Not on file  Occupational History  . Not on file  Tobacco Use  . Smoking status: Former Smoker    Types: Cigarettes    Quit date: 06/15/2013    Years since quitting: 7.0  . Smokeless tobacco: Never Used  Vaping Use  . Vaping Use: Never used  Substance and Sexual Activity  . Alcohol use: No    Alcohol/week: 0.0 standard drinks  . Drug use: No  . Sexual activity: Yes  Other Topics Concern  . Not on file  Social History Narrative  . Not on file   Social Determinants of Health   Financial Resource Strain: Not on file  Food Insecurity: Not on file  Transportation Needs: Not on file  Physical Activity: Not on file  Stress: Not on file  Social Connections: Not on file  Intimate Partner Violence: Not on file    FAMILY HISTORY: Family History  Problem Relation Age of Onset  . Heart disease Father   . Hypertension Father   . COPD Father   . Colon cancer Maternal Grandmother   . Heart attack Paternal Grandfather     ALLERGIES:  is allergic to glucotrol [glipizide].  MEDICATIONS:  Current Outpatient Medications  Medication Sig Dispense Refill  . clobetasol cream (TEMOVATE) 3.87 % Apply 1 application topically 2 (two) times daily. To affected skin; too strong for face, groin, underarms 30 g 2  . Glucose Blood (ACCU-CHEK AVIVA PLUS VI) by In Vitro route.    Marland Kitchen JARDIANCE 10 MG TABS tablet TAKE 1 TABLET BY MOUTH DAILY (Patient taking differently: Take 10 mg by mouth daily.) 30 tablet 2  . lisinopril (PRINIVIL,ZESTRIL) 10 MG tablet TAKE 1 TABLET(10 MG) BY MOUTH DAILY (Patient taking differently: Take 10 mg by mouth daily.) 90 tablet 1  . metFORMIN (GLUCOPHAGE-XR) 500 MG 24 hr tablet Take 3 pills by mouth daily for 4-5 days, then increase to 4 pills daily (if tolerated) (Patient taking differently: Take 2,000 mg by mouth daily with breakfast.) 360 tablet 0  . Multiple Vitamin  (MULTIVITAMIN) tablet Take 1 tablet by mouth daily.    . Omeprazole (PRILOSEC PO) Take 20 mg by mouth daily as needed (heart burn).     Marland Kitchen OZEMPIC, 0.25 OR 0.5 MG/DOSE, 2 MG/1.5ML SOPN once a week.     . rosuvastatin (CRESTOR) 10 MG tablet Take 1 tablet (10 mg total) by mouth at bedtime. 90 tablet 1  . amLODipine (NORVASC) 2.5 MG tablet Take 1 tablet (2.5 mg total) by mouth daily. (Patient not taking: No sig reported) 30 tablet 3  . aspirin EC 81 MG tablet Take 81 mg by mouth daily. (Patient not taking: No sig reported)    . [place]-Glucosamine-Vit D (OSTEO BI-FLEX ONE PER DAY) TABS Take 1 tablet by mouth daily. (Patient not taking: No sig reported)    . diclofenac Sodium (VOLTAREN) 1 % GEL Apply 2 g topically 4 (four) times daily. (Patient not taking: No sig reported) 100 g 1  . loperamide (IMODIUM) 2 MG capsule Take 1 capsule (2 mg total) by mouth See admin instructions. With onset of loose  stool, take 4mg  followed by 2mg  every 2 hours until 12 hours have passed without loose bowel movement. Maximum: 16 mg/day (Patient not taking: No sig reported) 60 capsule 0  . Omega-3 Fatty Acids (FISH OIL) 1200 MG CAPS Take 1,200 mg by mouth daily.  (Patient not taking: No sig reported)    . prochlorperazine (COMPAZINE) 10 MG tablet Take 1 tablet (10 mg total) by mouth every 6 (six) hours as needed for nausea or vomiting. (Patient not taking: No sig reported) 15 tablet 0   No current facility-administered medications for this visit.     PHYSICAL EXAMINATION: ECOG PERFORMANCE STATUS: 0 - Asymptomatic Vitals:   07/06/20 0852  BP: (!) 137/91  Pulse: 85  Resp: 18  Temp: (!) 97.4 F (36.3 C)   Filed Weights   07/06/20 0852  Weight: 209 lb 4.8 oz (94.9 kg)    Physical Exam Constitutional:      General: He is not in acute distress. HENT:     Head: Normocephalic and atraumatic.  Eyes:     General: No scleral icterus.    Pupils: Pupils are equal, round, and reactive to light.  Cardiovascular:      Rate and Rhythm: Normal rate and regular rhythm.     Heart sounds: Normal heart sounds.  Pulmonary:     Effort: Pulmonary effort is normal. No respiratory distress.     Breath sounds: No wheezing.  Abdominal:     General: Bowel sounds are normal. There is no distension.     Palpations: Abdomen is soft. There is no mass.     Tenderness: There is no abdominal tenderness.     Comments: Right anterior abdominal wall bulging, worse with Valsalva and standing position.  Musculoskeletal:        General: No deformity. Normal range of motion.     Cervical back: Normal range of motion and neck supple.  Skin:    General: Skin is warm and dry.     Findings: No erythema or rash.  Neurological:     Mental Status: He is alert and oriented to person, place, and time. Mental status is at baseline.     Cranial Nerves: No cranial nerve deficit.     Coordination: Coordination normal.  Psychiatric:        Mood and Affect: Mood normal.     LABORATORY DATA:  I have reviewed the data as listed Lab Results  Component Value Date   WBC 7.8 07/06/2020   HGB 15.2 07/06/2020   HCT 45.5 07/06/2020   MCV 92.1 07/06/2020   PLT 204 07/06/2020   Recent Labs    12/25/19 1031 12/25/19 1125 01/05/20 0840 01/12/20 1105 01/26/20 0829 02/12/20 1051 02/19/20 1115 02/27/20 1055 03/16/20 0830 05/25/20 0850 06/15/20 0833 07/06/20 0836  NA 134*  --  134*  --  135  --   --   --    < > 138 136 136  K 4.7  --  4.4  --  4.6  --   --   --    < > 4.5 4.4 4.6  CL 104  --  101  --  102  --   --   --    < > 104 100 103  CO2 24  --  27  --  24  --   --   --    < > 28 29 23   GLUCOSE 98  --  94  --  96  --   --   --    < >  142* 129* 127*  BUN 24*  --  26*  --  23*  --   --   --    < > 19 20 21*  CREATININE 1.04  --  1.20  --  1.10  --   --   --    < > 1.07 1.03 1.13  CALCIUM 8.8*  --  9.1  --  9.2  --   --   --    < > 9.0 9.4 9.3  GFRNONAA >60  --  >60  --  >60  --   --   --    < > >60 >60 >60  GFRAA >60  --  >60   --  >60  --   --   --   --   --   --   --   PROT 6.8   < > 6.9   < > 6.6 6.7 6.8 6.9   < > 6.9 7.2 7.1  ALBUMIN 4.0   < > 4.2   < > 3.9 4.1 4.0 4.2   < > 4.1 4.3 4.3  AST 24   < > 23   < > 183* 130* 99* 67*   < > 24 21 23   ALT 22   < > 21   < > 336* 266* 203* 127*   < > 25 23 23   ALKPHOS 46   < > 50   < > 60 66 64 57   < > 60 55 58  BILITOT 0.8   < > 0.7   < > 0.9 1.2 1.1 1.0   < > 0.5 0.7 0.9  BILIDIR  --    < >  --    < >  --  0.3* 0.2 0.2  --   --   --   --   IBILI  --    < >  --    < >  --  0.9 0.9 0.8  --   --   --   --    < > = values in this interval not displayed.   Iron/TIBC/Ferritin/ %Sat    Component Value Date/Time   IRON 118 09/25/2019 1115   TIBC 454 (H) 09/25/2019 1115   FERRITIN 236 09/25/2019 1115   IRONPCTSAT 26 09/25/2019 1115      RADIOGRAPHIC STUDIES: I have personally reviewed the radiological images as listed and agreed with the findings in the report.  CT CHEST ABDOMEN PELVIS W CONTRAST  Result Date: 05/11/2020 CLINICAL DATA:  Restaging metastatic renal cell carcinoma. EXAM: CT CHEST, ABDOMEN, AND PELVIS WITH CONTRAST TECHNIQUE: Multidetector CT imaging of the chest, abdomen and pelvis was performed following the standard protocol during bolus administration of intravenous contrast. CONTRAST:  116mL OMNIPAQUE IOHEXOL 300 MG/ML  SOLN COMPARISON:  01/29/2019 FINDINGS: CT CHEST FINDINGS Cardiovascular: The heart size appears within normal limits. No pericardial effusion identified. Mild aortic atherosclerosis and coronary artery calcifications. Mediastinum/Nodes: Normal appearance of the thyroid gland. The trachea appears patent and is midline. Normal appearance of the esophagus. No enlarged axillary, supraclavicular, mediastinal or hilar lymph nodes. Lungs/Pleura: No pleural effusion. Scattered small lung nodules appear similar to the previous exam. Index nodule within the posterolateral left upper lobe measures 4 mm and is stable from the previous study. Additional  small millimetric lung nodules are also unchanged. No new suspicious lung nodules. Musculoskeletal: No chest wall mass or suspicious bone lesions identified. CT ABDOMEN PELVIS FINDINGS Hepatobiliary: No focal liver abnormality is seen. No  gallstones, gallbladder wall thickening, or biliary dilatation. Pancreas: Unremarkable. No pancreatic ductal dilatation or surrounding inflammatory changes. Spleen: Normal in size without focal abnormality. Adrenals/Urinary Tract: Normal adrenal glands. Status post right nephrectomy. No signs of recurrence of tumor within the nephrectomy bed. Intermediate to high attenuating lesion arising from the posterior cortex of the left mid kidney measures 2.3 x 2.1 cm on today's exam, image 16/9. on 06/21/2018 this measured 2.4 x 1.9 cm. Simple appearing cyst within posterior cortex of the right mid kidney is unchanged measuring 1.5 cm, image 76/2. The bladder appears unremarkable. Stomach/Bowel: Stomach is normal. No dilated loops of large or small bowel. No bowel wall thickening, inflammation or distension. Vascular/Lymphatic: Aortic atherosclerosis. No enlarged abdominal or pelvic lymph nodes. Reproductive: Prostate is unremarkable. Other: No free fluid or fluid collections. Musculoskeletal: No acute or significant osseous findings. IMPRESSION: 1. Stable exam.  No signs of recurrent or metastatic disease. 2. Tiny nonspecific lung nodules are unchanged from previous exam 3.  Aortic Atherosclerosis (ICD10-I70.0). Electronically Signed   By: Kerby Moors M.D.   On: 05/11/2020 15:30      ASSESSMENT & PLAN:  1. Clear cell carcinoma of right kidney ([place])   2. Encounter for antineoplastic immunotherapy   3. Abdominal wall hernia   4. 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 Labs are reviewed and discussed with patient. Counts acceptable to proceed with Keytruda maintenance  today Patient will follow up in 3 weeks and proceed with next treatment. Then he will proceed with elective left knee replacement.  Abdominal wall bulging, likely abdominal wall hernia. Pending surgery evaluation.  Small nodules are stable on recent surveillance CT scan.  Attention on follow-up, plan to repeat CT scan after next Keytruda treatments. . Follow-up in 3 weeks We spent sufficient time to discuss many aspect of care, questions were answered to patient's satisfaction. All questions were answered. The patient knows to call the clinic with any problems questions or concerns.  Earlie Server, MD, PhD Hematology Oncology [facility] [employer] Pager- [phone] 07/06/2020

## 2020-07-06 NOTE — Progress Notes (Signed)
Patient here for follow up. Pt reports that he is scheduled for left knee surgery on 3/17.

## 2020-07-12 ENCOUNTER — Ambulatory Visit (INDEPENDENT_AMBULATORY_CARE_PROVIDER_SITE_OTHER): Payer: BC Managed Care – PPO | Admitting: Surgery

## 2020-07-12 ENCOUNTER — Other Ambulatory Visit: Payer: Self-pay

## 2020-07-12 ENCOUNTER — Encounter: Payer: Self-pay | Admitting: Surgery

## 2020-07-12 VITALS — BP 136/89 | HR 101 | Temp 98.8°F | Ht 72.0 in | Wt 209.8 lb

## 2020-07-12 DIAGNOSIS — K432 Incisional hernia without obstruction or gangrene: Secondary | ICD-10-CM

## 2020-07-12 NOTE — Patient Instructions (Signed)
Please see your appointment listed below.   Hernia, Adult     A hernia happens when tissue inside your body pushes out through a weak spot in your belly muscles (abdominal wall). This makes a round lump (bulge). The lump may be:  In a scar from surgery that was done in your belly (incisional hernia).  Near your belly button (umbilical hernia).  In your groin (inguinal hernia). Your groin is the area where your leg meets your lower belly (abdomen). This kind of hernia could also be: ? In your scrotum, if you are male. ? In folds of skin around your vagina, if you are male.  In your upper thigh (femoral hernia).  Inside your belly (hiatal hernia). This happens when your stomach slides above the muscle between your belly and your chest (diaphragm). If your hernia is small and it does not cause pain, you may not need treatment. If your hernia is large or it causes pain, you may need surgery. Follow these instructions at home: Activity  Avoid stretching or overusing (straining) the muscles near your hernia. Straining can happen when you: ? Lift something heavy. ? Poop (have a bowel movement).  Do not lift anything that is heavier than 10 lb (4.5 kg), or the limit that you are told, until your doctor says that it is safe.  Use the strength of your legs when you lift something heavy. Do not use only your back muscles to lift. General instructions  Do these things if told by your doctor so you do not have trouble pooping (constipation): ? Drink enough fluid to keep your pee (urine) pale yellow. ? Eat foods that are high in fiber. These include fresh fruits and vegetables, whole grains, and beans. ? Limit foods that are high in fat and processed sugars. These include foods that are fried or sweet. ? Take medicine for trouble pooping.  When you cough, try to cough gently.  You may try to push your hernia in by very gently pressing on it when you are lying down. Do not try to force  the bulge back in if it will not push in easily.  If you are overweight, work with your doctor to lose weight safely.  Do not use any products that have nicotine or tobacco in them. These include cigarettes and e-cigarettes. If you need help quitting, ask your doctor.  If you will be having surgery (hernia repair), watch your hernia for changes in shape, size, or color. Tell your doctor if you see any changes.  Take over-the-counter and prescription medicines only as told by your doctor.  Keep all follow-up visits as told by your doctor. Contact a doctor if:  You get new pain, swelling, or redness near your hernia.  You poop fewer times in a week than normal.  You have trouble pooping.  You have poop (stool) that is more dry than normal.  You have poop that is harder or larger than normal. Get help right away if:  You have a fever.  You have belly pain that gets worse.  You feel sick to your stomach (nauseous).  You throw up (vomit).  Your hernia cannot be pushed in by very gently pressing on it when you are lying down. Do not try to force the bulge back in if it will not push in easily.  Your hernia: ? Changes in shape or size. ? Changes color. ? Feels hard or it hurts when you touch it. These symptoms may represent a  serious problem that is an emergency. Do not wait to see if the symptoms will go away. Get medical help right away. Call your local emergency services (911 in the [place] Summary  A hernia happens when tissue inside your body pushes out through a weak spot in the belly muscles. This creates a bulge.  If your hernia is small and it does not hurt, you may not need treatment. If your hernia is large or it hurts, you may need surgery.  If you will be having surgery, watch your hernia for changes in shape, size, or color. Tell your doctor about any changes. This information is not intended to replace advice given to you by your health care provider. Make sure  you discuss any questions you have with your health care provider. Document Revised: 08/22/2018 Document Reviewed: 01/31/2017 Elsevier Patient Education  [employer].

## 2020-07-16 ENCOUNTER — Encounter: Payer: Self-pay | Admitting: Surgery

## 2020-07-16 NOTE — Progress Notes (Signed)
Patient ID: Matthew Woodard, male   DOB: 08-03-1965, 55 y.o.   MRN: [id number]  HPI Matthew Woodard is a 55 y.o. male 46-year-old male seen in consultation at the request of Dr. Tasia Catchings for a right incisional hernia.  He did have a history of renal cell carcinoma and is status post hand-assisted right nephrectomy performed Dr. Diamantina Providence 2020.  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  He has been started on Keytruda which she has tolerated well.  He does have significant bilateral knee pain and is scheduled to have elective knee replacement on the left side within a few weeks. He reports a bulge in the right lower quadrant that has been present for the last year or so.  Some discomfort and the bulge seems to be larger when lifting heavy objects.  He denies any fevers any chills no evidence of incarceration or strangulation. He is able to perform more than 4 METS of activity without any shortness of breath or chest pain.  He did have a CT of the chest abdomen pelvis that I personally reviewed showing evidence of bilateral pulmonary nodules and evidence of right lower quadrant incisional hernia. CBC and CMP were completely normal.  HPI  Past Medical History:  Diagnosis Date  . Arthritis    hands, left knee  . Cancer ([place])   . Diabetes mellitus without complication ([place])   . Eczema 01/08/2017  . GERD (gastroesophageal reflux disease)   . History of kidney cancer   . Hyperlipidemia LDL goal <100 11/02/2014  . Hypertension   . Kidney stone    YEAR H/O HEMATURIA  . Psoriasis   . Wears dentures    full upper, partial lower.  Has, does not wear    Past Surgical History:  Procedure Laterality Date  . COLONOSCOPY WITH PROPOFOL N/A 02/23/2017   Procedure: COLONOSCOPY WITH PROPOFOL;  Surgeon: [name], MD;  Location: [facility];  Service: Endoscopy;  Laterality: N/A;  Diabetic - oral  meds  . CYST EXCISION  Aug. 2016   on back  . CYSTOSCOPY W/ RETROGRADES Right 06/28/2018   Procedure: CYSTOSCOPY WITH RETROGRADE PYELOGRAM;  Surgeon: Billey Co, MD;  Location: [facility];  Service: Urology;  Laterality: Right;  . CYSTOSCOPY W/ URETERAL STENT REMOVAL Right 07/22/2018   Procedure: CYSTOSCOPY WITH STENT REMOVAL;  Surgeon: Billey Co, MD;  Location: [facility];  Service: Urology;  Laterality: Right;  . CYSTOSCOPY WITH BIOPSY Right 06/28/2018   Procedure: CYSTOSCOPY WITH RENAL BIOPSY;  Surgeon: Billey Co, MD;  Location: [facility];  Service: Urology;  Laterality: Right;  . CYSTOSCOPY WITH URETEROSCOPY AND STENT PLACEMENT Right 06/28/2018   Procedure: CYSTOSCOPY WITH URETEROSCOPY AND STENT PLACEMENT;  Surgeon: Billey Co, MD;  Location: [facility];  Service: Urology;  Laterality: Right;  . FULGURATION OF BLADDER TUMOR N/A 06/28/2018   Procedure: CYSTOSCOPY BLADDER BIOPSY, FULGERATION OF BLADDER;  Surgeon: Billey Co, MD;  Location: [facility];  Service: Urology;  Laterality: N/A;  . KNEE SURGERY Left   . LAPAROSCOPIC NEPHRECTOMY, HAND ASSISTED Right 07/22/2018   Procedure: HAND ASSISTED LAPAROSCOPIC NEPHRECTOMY;  Surgeon: Billey Co, MD;  Location: [facility];  Service: Urology;  Laterality: Right;  . NASAL SINUS SURGERY  03/15  . POLYPECTOMY N/A 02/23/2017   Procedure: POLYPECTOMY;  Surgeon: [name], MD;  Location: [place] [facility];  Service: Endoscopy;  Laterality: N/A;  .  tooth abstraction    . TRANSURETHRAL RESECTION OF BLADDER TUMOR N/A 05/12/2019   Procedure: TRANSURETHRAL RESECTION OF BLADDER TUMOR (TURBT);  Surgeon: Billey Co, MD;  Location: [facility];  Service: Urology;  Laterality: N/A;  . VARICOCELE EXCISION      Family History  Problem Relation Age of Onset  . Heart disease Father   . Hypertension Father   . COPD Father   . Colon cancer Maternal Grandmother   . Heart attack Paternal Grandfather     Social History Social History    Tobacco Use  . Smoking status: Former Smoker    Types: Cigarettes    Quit date: 06/15/2013    Years since quitting: 7.0  . Smokeless tobacco: Never Used  Vaping Use  . Vaping Use: Never used  Substance Use Topics  . Alcohol use: No    Alcohol/week: 0.0 standard drinks  . Drug use: No    Allergies  Allergen Reactions  . Glucotrol [Glipizide] Other (See Comments)    Glucose level spiked then dropped    Current Outpatient Medications  Medication Sig Dispense Refill  . clobetasol cream (TEMOVATE) 6.65 % Apply 1 application topically 2 (two) times daily. To affected skin; too strong for face, groin, underarms 30 g 2  . Glucose Blood (ACCU-CHEK AVIVA PLUS VI) by In Vitro route.    Marland Kitchen JARDIANCE 10 MG TABS tablet TAKE 1 TABLET BY MOUTH DAILY (Patient taking differently: Take 10 mg by mouth daily.) 30 tablet 2  . lisinopril (PRINIVIL,ZESTRIL) 10 MG tablet TAKE 1 TABLET(10 MG) BY MOUTH DAILY (Patient taking differently: Take 10 mg by mouth daily.) 90 tablet 1  . loperamide (IMODIUM) 2 MG capsule Take 1 capsule (2 mg total) by mouth See admin instructions. With onset of loose stool, take 4mg  followed by 2mg  every 2 hours until 12 hours have passed without loose bowel movement. Maximum: 16 mg/day 60 capsule 0  . metFORMIN (GLUCOPHAGE-XR) 500 MG 24 hr tablet Take 3 pills by mouth daily for 4-5 days, then increase to 4 pills daily (if tolerated) (Patient taking differently: Take 2,000 mg by mouth daily with breakfast.) 360 tablet 0  . Multiple Vitamin (MULTIVITAMIN) tablet Take 1 tablet by mouth daily.    . Omega-3 Fatty Acids (FISH OIL) 1200 MG CAPS Take 1,200 mg by mouth daily.    . Omeprazole (PRILOSEC PO) Take 20 mg by mouth daily as needed (heart burn).     Marland Kitchen OZEMPIC, 0.25 OR 0.5 MG/DOSE, 2 MG/1.5ML SOPN once a week.     . prochlorperazine (COMPAZINE) 10 MG tablet Take 1 tablet (10 mg total) by mouth every 6 (six) hours as needed for nausea or vomiting. 15 tablet 0  . rosuvastatin (CRESTOR)  10 MG tablet Take 1 tablet (10 mg total) by mouth at bedtime. 90 tablet 1   No current facility-administered medications for this visit.     Review of Systems Full ROS  was asked and was negative except for the information on the HPI  Physical Exam Blood pressure 136/89, pulse (!) 101, temperature 98.8 F (37.1 C), height 6' (1.829 m), weight 209 lb 12.8 oz (95.2 kg), SpO2 97 %. CONSTITUTIONAL: NAD EYES: Pupils are equal, round, and reactive to light, Sclera are non-icteric. EARS, NOSE, MOUTH AND THROAT: Wearing a mask. Hearing is intact to voice. LYMPH NODES:  Lymph nodes in the neck are normal. RESPIRATORY:  Lungs are clear. There is normal respiratory effort, with equal breath sounds bilaterally, and without pathologic use of accessory  muscles. CARDIOVASCULAR: Heart is regular without murmurs, gallops, or rubs. GI: The abdomen is soft, nontender, and nondistended. There are no palpable masses. There is no hepatosplenomegaly. There are normal bowel sounds in all quadrants. Evidence of RLQ incisional hernia, next to prior nephrectomy site, reducible. GU: Rectal deferred.   MUSCULOSKELETAL: Normal muscle strength and tone. No cyanosis or edema.   SKIN: Turgor is good and there are no pathologic skin lesions or ulcers. NEUROLOGIC: Motor and sensation is grossly normal. Cranial nerves are grossly intact. PSYCH:  Oriented to person, place and time. Affect is normal.  Data Reviewed  I have personally reviewed the patient's imaging, laboratory findings and medical records.    Assessment/Plan Incisional hernia that is reducible at this time.  I do recommend repair at some point in time.  The patient does have significant bilateral knee disease and is due for replacement.  Given that the most pressing issues at this time seem to be the joint replacement I would like to wait until he has bilateral joint replacement and examined him once again.  If he has recovered well I do recommend repair of  his ventral hernia.  I do think that he will be a good candidate for robotic approach.  Discussed with the patient in detail and he understands.  A copy of this report was sent to the referring provider. no need for emergent surgical intervention at this time  Time spent with the patient was 60 minutes, with more than 50% of the time spent in face-to-face education, counseling and care coordination.     Caroleen Hamman, MD FACS General Surgeon 07/16/2020, 2:43 PM

## 2020-07-21 ENCOUNTER — Telehealth: Payer: Self-pay | Admitting: *Deleted

## 2020-07-21 NOTE — Telephone Encounter (Signed)
Care Manager from [facility] called to give contact information should any assistance be needed for this patient's plan of care.

## 2020-07-27 ENCOUNTER — Encounter: Payer: Self-pay | Admitting: Oncology

## 2020-07-27 ENCOUNTER — Inpatient Hospital Stay: Payer: BC Managed Care – PPO | Attending: Oncology

## 2020-07-27 ENCOUNTER — Inpatient Hospital Stay: Payer: BC Managed Care – PPO

## 2020-07-27 ENCOUNTER — Inpatient Hospital Stay (HOSPITAL_BASED_OUTPATIENT_CLINIC_OR_DEPARTMENT_OTHER): Payer: BC Managed Care – PPO | Admitting: Oncology

## 2020-07-27 VITALS — BP 145/84 | HR 82 | Temp 96.7°F | Resp 18 | Wt 210.5 lb

## 2020-07-27 DIAGNOSIS — Z7984 Long term (current) use of oral hypoglycemic drugs: Secondary | ICD-10-CM | POA: Insufficient documentation

## 2020-07-27 DIAGNOSIS — L408 Other psoriasis: Secondary | ICD-10-CM | POA: Diagnosis not present

## 2020-07-27 DIAGNOSIS — E119 Type 2 diabetes mellitus without complications: Secondary | ICD-10-CM | POA: Insufficient documentation

## 2020-07-27 DIAGNOSIS — I10 Essential (primary) hypertension: Secondary | ICD-10-CM | POA: Insufficient documentation

## 2020-07-27 DIAGNOSIS — Z5112 Encounter for antineoplastic immunotherapy: Secondary | ICD-10-CM | POA: Diagnosis not present

## 2020-07-27 DIAGNOSIS — C641 Malignant neoplasm of right kidney, except renal pelvis: Secondary | ICD-10-CM | POA: Diagnosis not present

## 2020-07-27 DIAGNOSIS — K219 Gastro-esophageal reflux disease without esophagitis: Secondary | ICD-10-CM | POA: Diagnosis not present

## 2020-07-27 DIAGNOSIS — Z87891 Personal history of nicotine dependence: Secondary | ICD-10-CM | POA: Insufficient documentation

## 2020-07-27 DIAGNOSIS — C7911 Secondary malignant neoplasm of bladder: Secondary | ICD-10-CM | POA: Diagnosis not present

## 2020-07-27 DIAGNOSIS — Z79899 Other long term (current) drug therapy: Secondary | ICD-10-CM | POA: Diagnosis not present

## 2020-07-27 DIAGNOSIS — E785 Hyperlipidemia, unspecified: Secondary | ICD-10-CM | POA: Diagnosis not present

## 2020-07-27 DIAGNOSIS — C7800 Secondary malignant neoplasm of unspecified lung: Secondary | ICD-10-CM | POA: Diagnosis not present

## 2020-07-27 DIAGNOSIS — M170 Bilateral primary osteoarthritis of knee: Secondary | ICD-10-CM | POA: Insufficient documentation

## 2020-07-27 DIAGNOSIS — Z905 Acquired absence of kidney: Secondary | ICD-10-CM | POA: Insufficient documentation

## 2020-07-27 LAB — CBC WITH DIFFERENTIAL/PLATELET
Abs Immature Granulocytes: 0.05 10*3/uL (ref 0.00–0.07)
Basophils Absolute: 0.1 10*3/uL (ref 0.0–0.1)
Basophils Relative: 1 %
Eosinophils Absolute: 0.2 10*3/uL (ref 0.0–0.5)
Eosinophils Relative: 3 %
HCT: 44.2 % (ref 39.0–52.0)
Hemoglobin: 14.7 g/dL (ref 13.0–17.0)
Immature Granulocytes: 1 %
Lymphocytes Relative: 32 %
Lymphs Abs: 2.3 10*3/uL (ref 0.7–4.0)
MCH: 30.4 pg (ref 26.0–34.0)
MCHC: 33.3 g/dL (ref 30.0–36.0)
MCV: 91.5 fL (ref 80.0–100.0)
Monocytes Absolute: 0.7 10*3/uL (ref 0.1–1.0)
Monocytes Relative: 10 %
Neutro Abs: 3.9 10*3/uL (ref 1.7–7.7)
Neutrophils Relative %: 53 %
Platelets: 228 10*3/uL (ref 150–400)
RBC: 4.83 MIL/uL (ref 4.22–5.81)
RDW: 12.1 % (ref 11.5–15.5)
WBC: 7.2 10*3/uL (ref 4.0–10.5)
nRBC: 0 % (ref 0.0–0.2)

## 2020-07-27 LAB — COMPREHENSIVE METABOLIC PANEL
ALT: 19 U/L (ref 0–44)
AST: 19 U/L (ref 15–41)
Albumin: 4.3 g/dL (ref 3.5–5.0)
Alkaline Phosphatase: 67 U/L (ref 38–126)
Anion gap: 9 (ref 5–15)
BUN: 22 mg/dL — ABNORMAL HIGH (ref 6–20)
CO2: 26 mmol/L (ref 22–32)
Calcium: 9.5 mg/dL (ref 8.9–10.3)
Chloride: 102 mmol/L (ref 98–111)
Creatinine, Ser: 1.23 mg/dL (ref 0.61–1.24)
GFR, Estimated: >60 mL/min (ref >60)
Glucose, Bld: 128 mg/dL — ABNORMAL HIGH (ref 70–99)
Potassium: 4.3 mmol/L (ref 3.5–5.1)
Sodium: 137 mmol/L (ref 135–145)
Total Bilirubin: 0.7 mg/dL (ref 0.3–1.2)
Total Protein: 7.3 g/dL (ref 6.5–8.1)

## 2020-07-27 MED ORDER — SODIUM CHLORIDE 0.9 % IV SOLN
200.0000 mg | Freq: Once | INTRAVENOUS | Status: AC
  Administered 2020-07-27: 200 mg via INTRAVENOUS
  Filled 2020-07-27: qty 8

## 2020-07-27 MED ORDER — SODIUM CHLORIDE 0.9 % IV SOLN
Freq: Once | INTRAVENOUS | Status: AC
Start: 2020-07-27 — End: 2020-07-27
  Filled 2020-07-27: qty 250

## 2020-07-27 NOTE — Progress Notes (Signed)
Patient here for follow up. Pt is scheduled to have Left knee replacement surgery this week.

## 2020-07-27 NOTE — Progress Notes (Signed)
Hematology/Oncology follow up note [facility] [phone] [phone]   Patient Care Team: [name], NP as PCP - General [name], [name], MD as PCP - Family Medicine (Family Medicine) [name], MD as Consulting Physician (General Surgery) [name], [name], MD as Attending Physician (Family Medicine) [name], [name], MD as Consulting Physician (Dermatology)  REFERRING PROVIDER: [name], NP  CHIEF COMPLAINTS/REASON FOR VISIT:  Follow-up of kidney cancer  HISTORY OF PRESENTING ILLNESS:   Matthew Woodard is a  55 y.o.  male with PMH listed below was seen in consultation at the request of  [name], NP  for evaluation of kidney cancer Patient's cancer history dated back to February 2020 when he developed gross hematuria with small clots and right-sided flank pain.  Patient had a CT urogram done which showed right 4 cm central enhancing renal mass with invasion into the collecting system.  X-ray of chest was performed to see complete staging which showed no concerning findings. 06/28/2018 patient underwent a cystoscopy, bladder biopsy and a right retrograde pyelogram with intraoperative interpretation, right diagnostic ureteroscopy, right renal pelvis biopsy, right ureteral stent placement.  biopsy showed atypical cell clusters, with extensive crush artifact, nondiagnostic.   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  Surgical margins negative for tumor.pT3a,Nx Patient has been on surveillance after surgery. 10/28/2018 CT abdomen pelvis with contrast showed minimal fluid and or postoperative changes within the right renal fossa.  No evidence of metastatic disease or recurrence.  Patient has left kidney lesion previously characterized as  nonenhancing cyst by multi phasic contrast-enhanced CT. Patient reports an episode of gross hematuria in early December 2020, patient is due for 2-month surveillance CT scan. 04/30/2019 CT abdomen pelvis with contrast showed high attenuation mass at the right uretero vesicle junction, with extension into the bladder.  Bilateral pulmonary nodules, most indicated for metastatic disease.   Patient underwent cystoscopy and transurethral resection of irregular bladder tumor 3 cm.  Mass appeared to emanate from the right ureteral orifice.  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 Patient was referred to me for further work-up and management.  #Staging chest CT with contrast showed numerous bilateral pulmonary nodules.  Compatible with metastatic disease. Bone scan showed no osseous metastatic disease.   #History of tobacco abuse, former smoker.  Quit in 2015.  He denies any hemoptysis, chest pain, shortness of breath today. Patient reports that hematuria has completely resolved.  Denies any pain today. He is accompanied by his wife.  #History of cutaneous psoriasis, mostly on his right hand, currently on weekly methotrexate 12.5 mg with good symptom control. #NGS: [facility] PD L1 TPS 1%  # patient was referred to ENT for evaluation of headache and sinus pressure.  He had a ENT evaluation and feels that patient does not have sinusitis.  [name] recommended MRI brain to rule out PRES syndrome.  Axitinib has been held MRI brain was done and was negative.   #09/2019 off axitinib and Keytruda since the beginning of May due to transaminitis. Patient was seen and evaluated by gastroenterology [name]. Work-up was negative. Ultrasound liver vascular Doppler is negative. Recommend observation. #11/24/2019, resumed on Keytruda every 3 weeks #01/05/2020, axitinib was resumed 3 mg #01/20/2020, axitinib was discontinued due to recurrence of transaminitis .   Beryle Flock was temporarily held. #03/16/2020,  resumed on Keytruda every 3 weeks. INTERVAL HISTORY Matthew Woodard is a 55 y.o. male who has above history reviewed by me today presents for follow up visit for management of metastatic RCC. Problems and complaints are listed below:  Patient has no new complaints.  He has chronic bilateral knee pain due to degenerative disease. There is plan for him to have elective knee replacement in March 2022   Review of Systems  Constitutional: Negative for appetite change, chills, fatigue, fever and unexpected weight change.  HENT:   Negative for hearing loss and voice change.   Eyes: Negative for eye problems and icterus.  Respiratory: Negative for chest tightness, cough and shortness of breath.   Cardiovascular: Negative for chest pain and leg swelling.  Gastrointestinal: Negative for abdominal distention, abdominal pain, blood in stool and nausea.  Endocrine: Negative for hot flashes.  Genitourinary: Negative for difficulty urinating, dysuria and frequency.   Musculoskeletal: Positive for arthralgias.  Skin: Negative for itching and rash.  Neurological: Negative for extremity weakness, headaches, light-headedness and numbness.  Hematological: Negative for adenopathy. Does not bruise/bleed easily.  Psychiatric/Behavioral: Negative for confusion.    MEDICAL HISTORY:  Past Medical History:  Diagnosis Date  . Arthritis    hands, left knee  . Cancer ([place])   . Diabetes mellitus without complication ([place])   . Eczema 01/08/2017  . GERD (gastroesophageal reflux disease)   . History of kidney cancer   . Hyperlipidemia LDL goal <100 11/02/2014  . Hypertension   . Kidney stone    YEAR H/O HEMATURIA  . Psoriasis   . Wears dentures    full upper, partial lower.  Has, does not wear    SURGICAL HISTORY: Past Surgical History:  Procedure Laterality Date  . COLONOSCOPY WITH PROPOFOL N/A 02/23/2017   Procedure: COLONOSCOPY WITH PROPOFOL;  Surgeon:  [name], MD;  Location: [place];  Service: Endoscopy;  Laterality: N/A;  Diabetic - oral meds  . CYST EXCISION  Aug. 2016   on back  . CYSTOSCOPY W/ RETROGRADES Right 06/28/2018   Procedure: CYSTOSCOPY WITH RETROGRADE PYELOGRAM;  Surgeon: Billey Co, MD;  Location: [facility];  Service: Urology;  Laterality: Right;  . CYSTOSCOPY W/ URETERAL STENT REMOVAL Right 07/22/2018   Procedure: CYSTOSCOPY WITH STENT REMOVAL;  Surgeon: Billey Co, MD;  Location: [facility];  Service: Urology;  Laterality: Right;  . CYSTOSCOPY WITH BIOPSY Right 06/28/2018   Procedure: CYSTOSCOPY WITH RENAL BIOPSY;  Surgeon: Billey Co, MD;  Location: [facility];  Service: Urology;  Laterality: Right;  . CYSTOSCOPY WITH URETEROSCOPY AND STENT PLACEMENT Right 06/28/2018   Procedure: CYSTOSCOPY WITH URETEROSCOPY AND STENT PLACEMENT;  Surgeon: Billey Co, MD;  Location: [facility];  Service: Urology;  Laterality: Right;  . FULGURATION OF BLADDER TUMOR N/A 06/28/2018   Procedure: CYSTOSCOPY BLADDER BIOPSY, FULGERATION OF BLADDER;  Surgeon: Billey Co, MD;  Location: [facility];  Service: Urology;  Laterality: N/A;  . KNEE SURGERY Left   . LAPAROSCOPIC NEPHRECTOMY, HAND ASSISTED Right 07/22/2018   Procedure: HAND ASSISTED LAPAROSCOPIC NEPHRECTOMY;  Surgeon: Billey Co, MD;  Location: [facility];  Service: Urology;  Laterality: Right;  . NASAL SINUS SURGERY  03/15  . POLYPECTOMY N/A 02/23/2017   Procedure: POLYPECTOMY;  Surgeon: [name], MD;  Location: [place];  Service: Endoscopy;  Laterality: N/A;  . tooth abstraction    . TRANSURETHRAL RESECTION OF BLADDER TUMOR N/A 05/12/2019   Procedure: TRANSURETHRAL RESECTION OF BLADDER TUMOR (TURBT);  Surgeon: Billey Co, MD;  Location: [facility];  Service: Urology;  Laterality: N/A;  . VARICOCELE EXCISION      SOCIAL HISTORY: Social History   Socioeconomic History  . Marital status: Married    Spouse name: Not on file  .  Number of children: Not on file  . Years of education: Not on file  . Highest education level: Not on file  Occupational History  . Not on file  Tobacco Use  . Smoking status: Former Smoker    Types: Cigarettes    Quit date: 06/15/2013    Years since quitting: 7.1  . Smokeless tobacco: Never Used  Vaping Use  . Vaping Use: Never used  Substance and Sexual Activity  . Alcohol use: No    Alcohol/week: 0.0 standard drinks  . Drug use: No  . Sexual activity: Yes  Other Topics Concern  . Not on file  Social History Narrative  . Not on file   Social Determinants of Health   Financial Resource Strain: Not on file  Food Insecurity: Not on file  Transportation Needs: Not on file  Physical Activity: Not on file  Stress: Not on file  Social Connections: Not on file  Intimate Partner Violence: Not on file    FAMILY HISTORY: Family History  Problem Relation Age of Onset  . Heart disease Father   . Hypertension Father   . COPD Father   . Colon cancer Maternal Grandmother   . Heart attack Paternal Grandfather     ALLERGIES:  is allergic to glucotrol [glipizide].  MEDICATIONS:  Current Outpatient Medications  Medication Sig Dispense Refill  . clobetasol cream (TEMOVATE) 3.53 % Apply 1 application topically 2 (two) times daily. To affected skin; too strong for face, groin, underarms 30 g 2  . Glucose Blood (ACCU-CHEK AVIVA PLUS VI) by In Vitro route.    Marland Kitchen JARDIANCE 10 MG TABS tablet TAKE 1 TABLET BY MOUTH DAILY (Patient taking differently: Take 10 mg by mouth daily.) 30 tablet 2  . lisinopril (PRINIVIL,ZESTRIL) 10 MG tablet TAKE 1 TABLET(10 MG) BY MOUTH DAILY (Patient taking differently: Take 10 mg by mouth daily.) 90 tablet 1  . loperamide (IMODIUM) 2 MG capsule Take 1 capsule (2 mg total) by mouth See admin instructions. With onset of loose stool, take 4mg  followed by 2mg  every 2 hours until 12 hours have passed without loose bowel movement. Maximum: 16 mg/day 60 capsule 0  .  metFORMIN (GLUCOPHAGE-XR) 500 MG 24 hr tablet Take 3 pills by mouth daily for 4-5 days, then increase to 4 pills daily (if tolerated) (Patient taking differently: Take 2,000 mg by mouth daily with breakfast.) 360 tablet 0  . Multiple Vitamin (MULTIVITAMIN) tablet Take 1 tablet by mouth daily.    . Omeprazole (PRILOSEC PO) Take 20 mg by mouth daily as needed (heart burn).     Marland Kitchen OZEMPIC, 0.25 OR 0.5 MG/DOSE, 2 MG/1.5ML SOPN once a week.     . prochlorperazine (COMPAZINE) 10 MG tablet Take 1 tablet (10 mg total) by mouth every 6 (six) hours as needed for nausea or vomiting. 15 tablet 0  . rosuvastatin (CRESTOR) 10 MG tablet Take 1 tablet (10 mg total) by mouth at bedtime. 90 tablet 1   No current facility-administered medications for this visit.     PHYSICAL EXAMINATION: ECOG PERFORMANCE STATUS: 0 - Asymptomatic Vitals:   07/27/20 0905  BP: (!) 145/84  Pulse: 82  Resp: 18  Temp: (!) 96.7 F (35.9 C)   Filed  Weights   07/27/20 0905  Weight: 210 lb 8 oz (95.5 kg)    Physical Exam Constitutional:      General: He is not in acute distress. HENT:     Head: Normocephalic and atraumatic.  Eyes:     General: No scleral icterus.    Pupils: Pupils are equal, round, and reactive to light.  Cardiovascular:     Rate and Rhythm: Normal rate and regular rhythm.     Heart sounds: Normal heart sounds.  Pulmonary:     Effort: Pulmonary effort is normal. No respiratory distress.     Breath sounds: No wheezing.  Abdominal:     General: Bowel sounds are normal. There is no distension.     Palpations: Abdomen is soft. There is no mass.     Tenderness: There is no abdominal tenderness.     Comments: Right anterior abdominal wall bulging, worse with Valsalva and standing position.  Musculoskeletal:        General: No deformity. Normal range of motion.     Cervical back: Normal range of motion and neck supple.  Skin:    General: Skin is warm and dry.     Findings: No erythema or rash.   Neurological:     Mental Status: He is alert and oriented to person, place, and time. Mental status is at baseline.     Cranial Nerves: No cranial nerve deficit.     Coordination: Coordination normal.  Psychiatric:        Mood and Affect: Mood normal.     LABORATORY DATA:  I have reviewed the data as listed Lab Results  Component Value Date   WBC 7.2 07/27/2020   HGB 14.7 07/27/2020   HCT 44.2 07/27/2020   MCV 91.5 07/27/2020   PLT 228 07/27/2020   Recent Labs    12/25/19 1031 12/25/19 1125 01/05/20 0840 01/12/20 1105 01/26/20 0829 02/12/20 1051 02/19/20 1115 02/27/20 1055 03/16/20 0830 06/15/20 0833 07/06/20 0836 07/27/20 0827  NA 134*  --  134*  --  135  --   --   --    < > 136 136 137  K 4.7  --  4.4  --  4.6  --   --   --    < > 4.4 4.6 4.3  CL 104  --  101  --  102  --   --   --    < > 100 103 102  CO2 24  --  27  --  24  --   --   --    < > 29 23 26   GLUCOSE 98  --  94  --  96  --   --   --    < > 129* 127* 128*  BUN 24*  --  26*  --  23*  --   --   --    < > 20 21* 22*  CREATININE 1.04  --  1.20  --  1.10  --   --   --    < > 1.03 1.13 1.23  CALCIUM 8.8*  --  9.1  --  9.2  --   --   --    < > 9.4 9.3 9.5  GFRNONAA >60  --  >60  --  >60  --   --   --    < > >60 >60 >60  GFRAA >60  --  >60  --  >60  --   --   --   --   --   --   --  PROT 6.8   < > 6.9   < > 6.6 6.7 6.8 6.9   < > 7.2 7.1 7.3  ALBUMIN 4.0   < > 4.2   < > 3.9 4.1 4.0 4.2   < > 4.3 4.3 4.3  AST 24   < > 23   < > 183* 130* 99* 67*   < > 21 23 19   ALT 22   < > 21   < > 336* 266* 203* 127*   < > 23 23 19   ALKPHOS 46   < > 50   < > 60 66 64 57   < > 55 58 67  BILITOT 0.8   < > 0.7   < > 0.9 1.2 1.1 1.0   < > 0.7 0.9 0.7  BILIDIR  --    < >  --    < >  --  0.3* 0.2 0.2  --   --   --   --   IBILI  --    < >  --    < >  --  0.9 0.9 0.8  --   --   --   --    < > = values in this interval not displayed.   Iron/TIBC/Ferritin/ %Sat    Component Value Date/Time   IRON 118 09/25/2019 1115   TIBC 454  (H) 09/25/2019 1115   FERRITIN 236 09/25/2019 1115   IRONPCTSAT 26 09/25/2019 1115      RADIOGRAPHIC STUDIES: I have personally reviewed the radiological images as listed and agreed with the findings in the report.  CT CHEST ABDOMEN PELVIS W CONTRAST  Result Date: 05/11/2020 CLINICAL DATA:  Restaging metastatic renal cell carcinoma. EXAM: CT CHEST, ABDOMEN, AND PELVIS WITH CONTRAST TECHNIQUE: Multidetector CT imaging of the chest, abdomen and pelvis was performed following the standard protocol during bolus administration of intravenous contrast. CONTRAST:  112mL OMNIPAQUE IOHEXOL 300 MG/ML  SOLN COMPARISON:  01/29/2019 FINDINGS: CT CHEST FINDINGS Cardiovascular: The heart size appears within normal limits. No pericardial effusion identified. Mild aortic atherosclerosis and coronary artery calcifications. Mediastinum/Nodes: Normal appearance of the thyroid gland. The trachea appears patent and is midline. Normal appearance of the esophagus. No enlarged axillary, supraclavicular, mediastinal or hilar lymph nodes. Lungs/Pleura: No pleural effusion. Scattered small lung nodules appear similar to the previous exam. Index nodule within the posterolateral left upper lobe measures 4 mm and is stable from the previous study. Additional small millimetric lung nodules are also unchanged. No new suspicious lung nodules. Musculoskeletal: No chest wall mass or suspicious bone lesions identified. CT ABDOMEN PELVIS FINDINGS Hepatobiliary: No focal liver abnormality is seen. No gallstones, gallbladder wall thickening, or biliary dilatation. Pancreas: Unremarkable. No pancreatic ductal dilatation or surrounding inflammatory changes. Spleen: Normal in size without focal abnormality. Adrenals/Urinary Tract: Normal adrenal glands. Status post right nephrectomy. No signs of recurrence of tumor within the nephrectomy bed. Intermediate to high attenuating lesion arising from the posterior cortex of the left mid kidney measures  2.3 x 2.1 cm on today's exam, image 16/9. on 06/21/2018 this measured 2.4 x 1.9 cm. Simple appearing cyst within posterior cortex of the right mid kidney is unchanged measuring 1.5 cm, image 76/2. The bladder appears unremarkable. Stomach/Bowel: Stomach is normal. No dilated loops of large or small bowel. No bowel wall thickening, inflammation or distension. Vascular/Lymphatic: Aortic atherosclerosis. No enlarged abdominal or pelvic lymph nodes. Reproductive: Prostate is unremarkable. Other: No free fluid or fluid collections. Musculoskeletal: No acute or significant osseous  findings. IMPRESSION: 1. Stable exam.  No signs of recurrent or metastatic disease. 2. Tiny nonspecific lung nodules are unchanged from previous exam 3.  Aortic Atherosclerosis (ICD10-I70.0). Electronically Signed   By: Kerby Moors M.D.   On: 05/11/2020 15:30      ASSESSMENT & PLAN:  1. Clear cell carcinoma of right kidney ([place])   2. Encounter for antineoplastic immunotherapy   3. 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 Labs reviewed and discussed with patient Counts acceptable to proceed with Keytruda maintenance today He will follow-up with me in 3 weeks for the next cycle of treatment.  If he needs more time to recover, it is reasonable to postpone the next treatment for a week or 2.  Discussed with patient and he will call and update me if needed.  Plan to obtain next surveillance CT scan without contrast at the end of April.  Consider noncontrast study given that his creatinine is slowly trending up.  Small nodules are stable on recent surveillance CT scan.  Attention on follow-up,  . Follow-up in 3 weeks We spent sufficient time to discuss many aspect of care, questions were answered to patient's satisfaction. All questions were answered. The patient knows to call the clinic with any problems questions or  concerns.  Earlie Server, MD, PhD Hematology Oncology [facility] at [facility] Pager- [phone] 07/27/2020

## 2020-08-03 DIAGNOSIS — Z96652 Presence of left artificial knee joint: Secondary | ICD-10-CM | POA: Insufficient documentation

## 2020-08-12 ENCOUNTER — Encounter: Payer: Self-pay | Admitting: Oncology

## 2020-08-17 ENCOUNTER — Inpatient Hospital Stay: Payer: BC Managed Care – PPO

## 2020-08-17 ENCOUNTER — Inpatient Hospital Stay: Payer: BC Managed Care – PPO | Admitting: Oncology

## 2020-08-24 ENCOUNTER — Inpatient Hospital Stay (HOSPITAL_BASED_OUTPATIENT_CLINIC_OR_DEPARTMENT_OTHER): Payer: BC Managed Care – PPO | Admitting: Oncology

## 2020-08-24 ENCOUNTER — Inpatient Hospital Stay: Payer: BC Managed Care – PPO

## 2020-08-24 ENCOUNTER — Inpatient Hospital Stay: Payer: BC Managed Care – PPO | Attending: Oncology

## 2020-08-24 ENCOUNTER — Encounter: Payer: Self-pay | Admitting: Oncology

## 2020-08-24 VITALS — BP 111/66 | HR 80 | Resp 16

## 2020-08-24 VITALS — BP 127/82 | HR 112 | Temp 97.0°F | Resp 20 | Wt 195.2 lb

## 2020-08-24 DIAGNOSIS — K219 Gastro-esophageal reflux disease without esophagitis: Secondary | ICD-10-CM | POA: Diagnosis not present

## 2020-08-24 DIAGNOSIS — C641 Malignant neoplasm of right kidney, except renal pelvis: Secondary | ICD-10-CM | POA: Insufficient documentation

## 2020-08-24 DIAGNOSIS — Z87891 Personal history of nicotine dependence: Secondary | ICD-10-CM | POA: Diagnosis not present

## 2020-08-24 DIAGNOSIS — C7911 Secondary malignant neoplasm of bladder: Secondary | ICD-10-CM | POA: Insufficient documentation

## 2020-08-24 DIAGNOSIS — R000 Tachycardia, unspecified: Secondary | ICD-10-CM | POA: Diagnosis not present

## 2020-08-24 DIAGNOSIS — Z7984 Long term (current) use of oral hypoglycemic drugs: Secondary | ICD-10-CM | POA: Insufficient documentation

## 2020-08-24 DIAGNOSIS — Z79899 Other long term (current) drug therapy: Secondary | ICD-10-CM | POA: Insufficient documentation

## 2020-08-24 DIAGNOSIS — C7800 Secondary malignant neoplasm of unspecified lung: Secondary | ICD-10-CM | POA: Diagnosis present

## 2020-08-24 DIAGNOSIS — E119 Type 2 diabetes mellitus without complications: Secondary | ICD-10-CM | POA: Insufficient documentation

## 2020-08-24 DIAGNOSIS — Z96652 Presence of left artificial knee joint: Secondary | ICD-10-CM

## 2020-08-24 DIAGNOSIS — Z905 Acquired absence of kidney: Secondary | ICD-10-CM | POA: Insufficient documentation

## 2020-08-24 DIAGNOSIS — L408 Other psoriasis: Secondary | ICD-10-CM | POA: Diagnosis not present

## 2020-08-24 DIAGNOSIS — Z5112 Encounter for antineoplastic immunotherapy: Secondary | ICD-10-CM | POA: Insufficient documentation

## 2020-08-24 DIAGNOSIS — Z7982 Long term (current) use of aspirin: Secondary | ICD-10-CM | POA: Insufficient documentation

## 2020-08-24 DIAGNOSIS — I10 Essential (primary) hypertension: Secondary | ICD-10-CM | POA: Insufficient documentation

## 2020-08-24 LAB — CBC WITH DIFFERENTIAL/PLATELET
Abs Immature Granulocytes: 0.1 10*3/uL — ABNORMAL HIGH (ref 0.00–0.07)
Basophils Absolute: 0.1 10*3/uL (ref 0.0–0.1)
Basophils Relative: 0 %
Eosinophils Absolute: 0.2 10*3/uL (ref 0.0–0.5)
Eosinophils Relative: 2 %
HCT: 41.6 % (ref 39.0–52.0)
Hemoglobin: 13.7 g/dL (ref 13.0–17.0)
Immature Granulocytes: 1 %
Lymphocytes Relative: 17 %
Lymphs Abs: 2 10*3/uL (ref 0.7–4.0)
MCH: 30.2 pg (ref 26.0–34.0)
MCHC: 32.9 g/dL (ref 30.0–36.0)
MCV: 91.8 fL (ref 80.0–100.0)
Monocytes Absolute: 0.7 10*3/uL (ref 0.1–1.0)
Monocytes Relative: 6 %
Neutro Abs: 8.7 10*3/uL — ABNORMAL HIGH (ref 1.7–7.7)
Neutrophils Relative %: 74 %
Platelets: 440 10*3/uL — ABNORMAL HIGH (ref 150–400)
RBC: 4.53 MIL/uL (ref 4.22–5.81)
RDW: 12.1 % (ref 11.5–15.5)
WBC: 11.8 10*3/uL — ABNORMAL HIGH (ref 4.0–10.5)
nRBC: 0 % (ref 0.0–0.2)

## 2020-08-24 LAB — COMPREHENSIVE METABOLIC PANEL
ALT: 22 U/L (ref 0–44)
AST: 24 U/L (ref 15–41)
Albumin: 4.3 g/dL (ref 3.5–5.0)
Alkaline Phosphatase: 95 U/L (ref 38–126)
Anion gap: 10 (ref 5–15)
BUN: 24 mg/dL — ABNORMAL HIGH (ref 6–20)
CO2: 25 mmol/L (ref 22–32)
Calcium: 10 mg/dL (ref 8.9–10.3)
Chloride: 100 mmol/L (ref 98–111)
Creatinine, Ser: 1.01 mg/dL (ref 0.61–1.24)
GFR, Estimated: >60 mL/min (ref >60)
Glucose, Bld: 151 mg/dL — ABNORMAL HIGH (ref 70–99)
Potassium: 4.9 mmol/L (ref 3.5–5.1)
Sodium: 135 mmol/L (ref 135–145)
Total Bilirubin: 0.6 mg/dL (ref 0.3–1.2)
Total Protein: 7.9 g/dL (ref 6.5–8.1)

## 2020-08-24 LAB — TSH: TSH: 0.793 u[IU]/mL (ref 0.350–4.500)

## 2020-08-24 MED ORDER — SODIUM CHLORIDE 0.9 % IV SOLN
Freq: Once | INTRAVENOUS | Status: AC
Start: 2020-08-24 — End: 2020-08-24
  Filled 2020-08-24: qty 250

## 2020-08-24 MED ORDER — SODIUM CHLORIDE 0.9 % IV SOLN
200.0000 mg | Freq: Once | INTRAVENOUS | Status: AC
  Administered 2020-08-24: 200 mg via INTRAVENOUS
  Filled 2020-08-24: qty 8

## 2020-08-24 NOTE — Progress Notes (Signed)
HR decreased to 103. Per Dr. Tasia Catchings, proceed if HR <110

## 2020-08-24 NOTE — Progress Notes (Signed)
Pt does not have any new concerns at this time.

## 2020-08-24 NOTE — Progress Notes (Signed)
HR 103 ok to proceed per md

## 2020-08-24 NOTE — Progress Notes (Signed)
Hematology/Oncology follow up note [facility] [phone] [phone]   Patient Care Team: [name], NP as PCP - General [name], [name], MD as PCP - Family Medicine (Family Medicine) [name], MD as Consulting Physician (General Surgery) [name], [name], MD as Attending Physician (Family Medicine) [name], [name], MD as Consulting Physician (Dermatology)  REFERRING PROVIDER: [name], NP  CHIEF COMPLAINTS/REASON FOR VISIT:  Follow-up of kidney cancer  HISTORY OF PRESENTING ILLNESS:   Matthew Woodard is a  55 y.o.  male with PMH listed below was seen in consultation at the request of  [name], NP  for evaluation of kidney cancer Patient's cancer history dated back to February 2020 when he developed gross hematuria with small clots and right-sided flank pain.  Patient had a CT urogram done which showed right 4 cm central enhancing renal mass with invasion into the collecting system.  X-ray of chest was performed to see complete staging which showed no concerning findings. 06/28/2018 patient underwent a cystoscopy, bladder biopsy and a right retrograde pyelogram with intraoperative interpretation, right diagnostic ureteroscopy, right renal pelvis biopsy, right ureteral stent placement.  biopsy showed atypical cell clusters, with extensive crush artifact, nondiagnostic.   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  Surgical margins negative for tumor.pT3a,Nx Patient has been on surveillance after surgery. 10/28/2018 CT abdomen pelvis with contrast showed minimal fluid and or postoperative changes within the right renal fossa.  No evidence of metastatic disease or recurrence.  Patient has left kidney lesion previously characterized as  nonenhancing cyst by multi phasic contrast-enhanced CT. Patient reports an episode of gross hematuria in early December 2020, patient is due for 2-month surveillance CT scan. 04/30/2019 CT abdomen pelvis with contrast showed high attenuation mass at the right uretero vesicle junction, with extension into the bladder.  Bilateral pulmonary nodules, most indicated for metastatic disease.   Patient underwent cystoscopy and transurethral resection of irregular bladder tumor 3 cm.  Mass appeared to emanate from the right ureteral orifice.  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 Patient was referred to me for further work-up and management.  #Staging chest CT with contrast showed numerous bilateral pulmonary nodules.  Compatible with metastatic disease. Bone scan showed no osseous metastatic disease.   #History of tobacco abuse, former smoker.  Quit in 2015.  He denies any hemoptysis, chest pain, shortness of breath today. Patient reports that hematuria has completely resolved.  Denies any pain today. He is accompanied by his wife.  #History of cutaneous psoriasis, mostly on his right hand, currently on weekly methotrexate 12.5 mg with good symptom control. #NGS: [facility] PD L1 TPS 1%  # patient was referred to ENT for evaluation of headache and sinus pressure.  He had a ENT evaluation and feels that patient does not have sinusitis.  [name] recommended MRI brain to rule out PRES syndrome.  Axitinib has been held MRI brain was done and was negative.   #09/2019 off axitinib and Keytruda since the beginning of May due to transaminitis. Patient was seen and evaluated by gastroenterology [name]. Work-up was negative. Ultrasound liver vascular Doppler is negative. Recommend observation. #11/24/2019, resumed on Keytruda every 3 weeks #01/05/2020, axitinib was resumed 3 mg #01/20/2020, axitinib was discontinued due to recurrence of transaminitis .   Beryle Flock was temporarily held. #03/16/2020,  resumed on Keytruda every 3 weeks.  INTERVAL HISTORY Matthew Woodard Woodard is a 55 y.o. male who has above history reviewed by me today presents for follow up visit for management of metastatic RCC. Problems and complaints are listed below:  Status post left knee replacement.  Patient reports pain of left lower extremity since the surgery.  He takes pain medication with symptom relief.  Today he has not taking his pain meds yet.  No other new complaints. No fever, chills,  Review of Systems  Constitutional: Negative for appetite change, chills, fatigue, fever and unexpected weight change.  HENT:   Negative for hearing loss and voice change.   Eyes: Negative for eye problems and icterus.  Respiratory: Negative for chest tightness, cough and shortness of breath.   Cardiovascular: Negative for chest pain and leg swelling.  Gastrointestinal: Negative for abdominal distention, abdominal pain, blood in stool and nausea.  Endocrine: Negative for hot flashes.  Genitourinary: Negative for difficulty urinating, dysuria and frequency.   Musculoskeletal: Positive for arthralgias.  Skin: Negative for itching and rash.  Neurological: Negative for extremity weakness, headaches, light-headedness and numbness.  Hematological: Negative for adenopathy. Does not bruise/bleed easily.  Psychiatric/Behavioral: Negative for confusion.    MEDICAL HISTORY:  Past Medical History:  Diagnosis Date  . Arthritis    hands, left knee  . Cancer ([place])   . Diabetes mellitus without complication ([place])   . Eczema 01/08/2017  . GERD (gastroesophageal reflux disease)   . History of kidney cancer   . Hyperlipidemia LDL goal <100 11/02/2014  . Hypertension   . Kidney stone    YEAR H/O HEMATURIA  . Psoriasis   . Wears dentures    full upper, partial lower.  Has, does not wear    SURGICAL HISTORY: Past Surgical History:  Procedure Laterality Date  . COLONOSCOPY WITH PROPOFOL  N/A 02/23/2017   Procedure: COLONOSCOPY WITH PROPOFOL;  Surgeon: [name], MD;  Location: [place];  Service: Endoscopy;  Laterality: N/A;  Diabetic - oral meds  . CYST EXCISION  Aug. 2016   on back  . CYSTOSCOPY W/ RETROGRADES Right 06/28/2018   Procedure: CYSTOSCOPY WITH RETROGRADE PYELOGRAM;  Surgeon: Billey Co, MD;  Location: [facility];  Service: Urology;  Laterality: Right;  . CYSTOSCOPY W/ URETERAL STENT REMOVAL Right 07/22/2018   Procedure: CYSTOSCOPY WITH STENT REMOVAL;  Surgeon: Billey Co, MD;  Location: [facility];  Service: Urology;  Laterality: Right;  . CYSTOSCOPY WITH BIOPSY Right 06/28/2018   Procedure: CYSTOSCOPY WITH RENAL BIOPSY;  Surgeon: Billey Co, MD;  Location: [facility];  Service: Urology;  Laterality: Right;  . CYSTOSCOPY WITH URETEROSCOPY AND STENT PLACEMENT Right 06/28/2018   Procedure: CYSTOSCOPY WITH URETEROSCOPY AND STENT PLACEMENT;  Surgeon: Billey Co, MD;  Location: [facility];  Service: Urology;  Laterality: Right;  . FULGURATION OF BLADDER TUMOR N/A 06/28/2018   Procedure: CYSTOSCOPY BLADDER BIOPSY, FULGERATION OF BLADDER;  Surgeon: Billey Co, MD;  Location: [facility];  Service: Urology;  Laterality: N/A;  . KNEE SURGERY Left   . LAPAROSCOPIC NEPHRECTOMY, HAND ASSISTED Right 07/22/2018   Procedure: HAND ASSISTED LAPAROSCOPIC NEPHRECTOMY;  Surgeon: Billey Co, MD;  Location: [facility];  Service: Urology;  Laterality: Right;  . NASAL SINUS SURGERY  03/15  . POLYPECTOMY N/A 02/23/2017   Procedure: POLYPECTOMY;  Surgeon: [name], MD;  Location: [place];  Service: Endoscopy;  Laterality: N/A;  . tooth abstraction    . TRANSURETHRAL RESECTION OF BLADDER  TUMOR N/A 05/12/2019   Procedure: TRANSURETHRAL RESECTION OF BLADDER TUMOR (TURBT);  Surgeon: Billey Co, MD;  Location: [facility];  Service: Urology;  Laterality: N/A;  . VARICOCELE EXCISION      SOCIAL HISTORY: Social History   Socioeconomic History   . Marital status: Married    Spouse name: Not on file  . Number of children: Not on file  . Years of education: Not on file  . Highest education level: Not on file  Occupational History  . Not on file  Tobacco Use  . Smoking status: Former Smoker    Types: Cigarettes    Quit date: 06/15/2013    Years since quitting: 7.1  . Smokeless tobacco: Never Used  Vaping Use  . Vaping Use: Never used  Substance and Sexual Activity  . Alcohol use: No    Alcohol/week: 0.0 standard drinks  . Drug use: No  . Sexual activity: Yes  Other Topics Concern  . Not on file  Social History Narrative  . Not on file   Social Determinants of Health   Financial Resource Strain: Not on file  Food Insecurity: Not on file  Transportation Needs: Not on file  Physical Activity: Not on file  Stress: Not on file  Social Connections: Not on file  Intimate Partner Violence: Not on file    FAMILY HISTORY: Family History  Problem Relation Age of Onset  . Heart disease Father   . Hypertension Father   . COPD Father   . Colon cancer Maternal Grandmother   . Heart attack Paternal Grandfather     ALLERGIES:  is allergic to glucotrol [glipizide].  MEDICATIONS:  Current Outpatient Medications  Medication Sig Dispense Refill  . aspirin 81 MG chewable tablet CHEW AND SWALLOW 1 TABLET BY MOUTH TWICE DAILY    . Glucose Blood (ACCU-CHEK AVIVA PLUS VI) by In Vitro route.    Marland Kitchen JARDIANCE 10 MG TABS tablet TAKE 1 TABLET BY MOUTH DAILY (Patient taking differently: Take 10 mg by mouth daily.) 30 tablet 2  . lisinopril (PRINIVIL,ZESTRIL) 10 MG tablet TAKE 1 TABLET(10 MG) BY MOUTH DAILY (Patient taking differently: Take 10 mg by mouth daily.) 90 tablet 1  . loperamide (IMODIUM) 2 MG capsule Take 1 capsule (2 mg total) by mouth See admin instructions. With onset of loose stool, take 4mg  followed by 2mg  every 2 hours until 12 hours have passed without loose bowel movement. Maximum: 16 mg/day 60 capsule 0  . metFORMIN  (GLUCOPHAGE-XR) 500 MG 24 hr tablet Take 3 pills by mouth daily for 4-5 days, then increase to 4 pills daily (if tolerated) (Patient taking differently: Take 2,000 mg by mouth daily with breakfast.) 360 tablet 0  . Multiple Vitamin (MULTIVITAMIN) tablet Take 1 tablet by mouth daily.    . Omeprazole (PRILOSEC PO) Take 20 mg by mouth daily as needed (heart burn).     . Oxycodone HCl 10 MG TABS TAKE 1 TABLET BY MOUTH EVERY 4 HOURS    . OZEMPIC, 0.25 OR 0.5 MG/DOSE, 2 MG/1.5ML SOPN once a week.     . rosuvastatin (CRESTOR) 10 MG tablet Take 1 tablet (10 mg total) by mouth at bedtime. 90 tablet 1  . clobetasol cream (TEMOVATE) 1.24 % Apply 1 application topically 2 (two) times daily. To affected skin; too strong for face, groin, underarms (Patient not taking: Reported on 08/24/2020) 30 g 2  . prochlorperazine (COMPAZINE) 10 MG tablet Take 1 tablet (10 mg total) by mouth every 6 (six) hours as  needed for nausea or vomiting. (Patient not taking: Reported on 08/24/2020) 15 tablet 0   No current facility-administered medications for this visit.     PHYSICAL EXAMINATION: ECOG PERFORMANCE STATUS: 1 - Symptomatic but completely ambulatory Vitals:   08/24/20 0846 08/24/20 0851  BP: 127/82   Pulse: (!) 118 (!) 112  Resp: 20   Temp: (!) 97 F (36.1 C)   SpO2: 97%    Filed Weights   08/24/20 0846  Weight: 195 lb 3.2 oz (88.5 kg)    Physical Exam Constitutional:      General: He is not in acute distress.    Comments: Patient walks with a walker  HENT:     Head: Normocephalic and atraumatic.  Eyes:     General: No scleral icterus.    Pupils: Pupils are equal, round, and reactive to light.  Cardiovascular:     Rate and Rhythm: Normal rate and regular rhythm.     Heart sounds: Normal heart sounds.  Pulmonary:     Effort: Pulmonary effort is normal. No respiratory distress.     Breath sounds: No wheezing.  Abdominal:     General: Bowel sounds are normal. There is no distension.     Palpations:  Abdomen is soft. There is no mass.     Tenderness: There is no abdominal tenderness.     Comments: Right anterior abdominal wall bulging, worse with Valsalva and standing position.  Musculoskeletal:        General: No deformity. Normal range of motion.     Cervical back: Normal range of motion and neck supple.  Skin:    General: Skin is warm and dry.     Findings: No erythema or rash.  Neurological:     Mental Status: He is alert and oriented to person, place, and time. Mental status is at baseline.     Cranial Nerves: No cranial nerve deficit.     Coordination: Coordination normal.  Psychiatric:        Mood and Affect: Mood normal.     LABORATORY DATA:  I have reviewed the data as listed Lab Results  Component Value Date   WBC 11.8 (H) 08/24/2020   HGB 13.7 08/24/2020   HCT 41.6 08/24/2020   MCV 91.8 08/24/2020   PLT 440 (H) 08/24/2020   Recent Labs    12/25/19 1031 12/25/19 1125 01/05/20 0840 01/12/20 1105 01/26/20 0829 02/12/20 1051 02/19/20 1115 02/27/20 1055 03/16/20 0830 07/06/20 0836 07/27/20 0827 08/24/20 0831  NA 134*  --  134*  --  135  --   --   --    < > 136 137 135  K 4.7  --  4.4  --  4.6  --   --   --    < > 4.6 4.3 4.9  CL 104  --  101  --  102  --   --   --    < > 103 102 100  CO2 24  --  27  --  24  --   --   --    < > 23 26 25   GLUCOSE 98  --  94  --  96  --   --   --    < > 127* 128* 151*  BUN 24*  --  26*  --  23*  --   --   --    < > 21* 22* 24*  CREATININE 1.04  --  1.20  --  1.10  --   --   --    < >  1.13 1.23 1.01  CALCIUM 8.8*  --  9.1  --  9.2  --   --   --    < > 9.3 9.5 10.0  GFRNONAA >60  --  >60  --  >60  --   --   --    < > >60 >60 >60  GFRAA >60  --  >60  --  >60  --   --   --   --   --   --   --   PROT 6.8   < > 6.9   < > 6.6 6.7 6.8 6.9   < > 7.1 7.3 7.9  ALBUMIN 4.0   < > 4.2   < > 3.9 4.1 4.0 4.2   < > 4.3 4.3 4.3  AST 24   < > 23   < > 183* 130* 99* 67*   < > 23 19 24   ALT 22   < > 21   < > 336* 266* 203* 127*   < > 23 19 22    ALKPHOS 46   < > 50   < > 60 66 64 57   < > 58 67 95  BILITOT 0.8   < > 0.7   < > 0.9 1.2 1.1 1.0   < > 0.9 0.7 0.6  BILIDIR  --    < >  --    < >  --  0.3* 0.2 0.2  --   --   --   --   IBILI  --    < >  --    < >  --  0.9 0.9 0.8  --   --   --   --    < > = values in this interval not displayed.   Iron/TIBC/Ferritin/ %Sat    Component Value Date/Time   IRON 118 09/25/2019 1115   TIBC 454 (H) 09/25/2019 1115   FERRITIN 236 09/25/2019 1115   IRONPCTSAT 26 09/25/2019 1115      RADIOGRAPHIC STUDIES: I have personally reviewed the radiological images as listed and agreed with the findings in the report.  No results found.    ASSESSMENT & PLAN:  1. Clear cell carcinoma of right kidney ([place])   2. Encounter for antineoplastic immunotherapy   3. History of left knee replacement   4. 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 Labs are reviewed and discussed with patient Acceptable to proceed with Keytruda maintenance today.  Plan to obtain next surveillance CT scan without contrast at the end of April.  Consider noncontrast study given that his creatinine is slowly trending up.  Patient prefers to obtain the scan after his next treatment.  We will discuss in the next visit.  Small nodules are stable on recent surveillance CT scan.  Attention on follow-up,  Recent history of knee replacement.  Follow-up with orthopedic surgeon.  Patient is on aspirin for DVT prophylaxis. Tachycardia, likely due to the pain.  He denies any shortness of breath or chest discomfort.  No lower extremity swelling.  Monitor symptoms . Follow-up in 3 weeks We spent sufficient time to discuss many aspect of care, questions were answered to patient's satisfaction. All questions were answered. The patient knows to call the clinic with any problems questions or concerns.  Earlie Server, MD, PhD Hematology Oncology [facility] Pager- [phone] 08/24/2020

## 2020-09-14 ENCOUNTER — Encounter: Payer: Self-pay | Admitting: Oncology

## 2020-09-14 ENCOUNTER — Inpatient Hospital Stay: Payer: BC Managed Care – PPO

## 2020-09-14 ENCOUNTER — Inpatient Hospital Stay: Payer: BC Managed Care – PPO | Attending: Oncology

## 2020-09-14 ENCOUNTER — Inpatient Hospital Stay (HOSPITAL_BASED_OUTPATIENT_CLINIC_OR_DEPARTMENT_OTHER): Payer: BC Managed Care – PPO | Admitting: Oncology

## 2020-09-14 VITALS — BP 135/82 | HR 103 | Temp 96.5°F | Resp 18 | Wt 195.9 lb

## 2020-09-14 DIAGNOSIS — Z96652 Presence of left artificial knee joint: Secondary | ICD-10-CM | POA: Diagnosis not present

## 2020-09-14 DIAGNOSIS — Z87891 Personal history of nicotine dependence: Secondary | ICD-10-CM | POA: Insufficient documentation

## 2020-09-14 DIAGNOSIS — Z7984 Long term (current) use of oral hypoglycemic drugs: Secondary | ICD-10-CM | POA: Insufficient documentation

## 2020-09-14 DIAGNOSIS — E119 Type 2 diabetes mellitus without complications: Secondary | ICD-10-CM | POA: Diagnosis not present

## 2020-09-14 DIAGNOSIS — C7800 Secondary malignant neoplasm of unspecified lung: Secondary | ICD-10-CM | POA: Insufficient documentation

## 2020-09-14 DIAGNOSIS — Z905 Acquired absence of kidney: Secondary | ICD-10-CM | POA: Insufficient documentation

## 2020-09-14 DIAGNOSIS — I10 Essential (primary) hypertension: Secondary | ICD-10-CM | POA: Insufficient documentation

## 2020-09-14 DIAGNOSIS — Z79899 Other long term (current) drug therapy: Secondary | ICD-10-CM | POA: Diagnosis not present

## 2020-09-14 DIAGNOSIS — E785 Hyperlipidemia, unspecified: Secondary | ICD-10-CM | POA: Insufficient documentation

## 2020-09-14 DIAGNOSIS — Z5112 Encounter for antineoplastic immunotherapy: Secondary | ICD-10-CM | POA: Diagnosis not present

## 2020-09-14 DIAGNOSIS — M1990 Unspecified osteoarthritis, unspecified site: Secondary | ICD-10-CM | POA: Insufficient documentation

## 2020-09-14 DIAGNOSIS — C641 Malignant neoplasm of right kidney, except renal pelvis: Secondary | ICD-10-CM

## 2020-09-14 LAB — CBC WITH DIFFERENTIAL/PLATELET
Abs Immature Granulocytes: 0.04 10*3/uL (ref 0.00–0.07)
Basophils Absolute: 0 10*3/uL (ref 0.0–0.1)
Basophils Relative: 1 %
Eosinophils Absolute: 0.3 10*3/uL (ref 0.0–0.5)
Eosinophils Relative: 3 %
HCT: 42.3 % (ref 39.0–52.0)
Hemoglobin: 13.8 g/dL (ref 13.0–17.0)
Immature Granulocytes: 1 %
Lymphocytes Relative: 25 %
Lymphs Abs: 2 10*3/uL (ref 0.7–4.0)
MCH: 29.4 pg (ref 26.0–34.0)
MCHC: 32.6 g/dL (ref 30.0–36.0)
MCV: 90.2 fL (ref 80.0–100.0)
Monocytes Absolute: 0.5 10*3/uL (ref 0.1–1.0)
Monocytes Relative: 7 %
Neutro Abs: 5.2 10*3/uL (ref 1.7–7.7)
Neutrophils Relative %: 63 %
Platelets: 319 10*3/uL (ref 150–400)
RBC: 4.69 MIL/uL (ref 4.22–5.81)
RDW: 12.2 % (ref 11.5–15.5)
WBC: 8.1 10*3/uL (ref 4.0–10.5)
nRBC: 0 % (ref 0.0–0.2)

## 2020-09-14 LAB — COMPREHENSIVE METABOLIC PANEL
ALT: 20 U/L (ref 0–44)
AST: 21 U/L (ref 15–41)
Albumin: 4.1 g/dL (ref 3.5–5.0)
Alkaline Phosphatase: 90 U/L (ref 38–126)
Anion gap: 12 (ref 5–15)
BUN: 21 mg/dL — ABNORMAL HIGH (ref 6–20)
CO2: 25 mmol/L (ref 22–32)
Calcium: 9.7 mg/dL (ref 8.9–10.3)
Chloride: 98 mmol/L (ref 98–111)
Creatinine, Ser: 0.96 mg/dL (ref 0.61–1.24)
GFR, Estimated: >60 mL/min (ref >60)
Glucose, Bld: 155 mg/dL — ABNORMAL HIGH (ref 70–99)
Potassium: 4.3 mmol/L (ref 3.5–5.1)
Sodium: 135 mmol/L (ref 135–145)
Total Bilirubin: 0.6 mg/dL (ref 0.3–1.2)
Total Protein: 8.1 g/dL (ref 6.5–8.1)

## 2020-09-14 MED ORDER — SODIUM CHLORIDE 0.9 % IV SOLN
200.0000 mg | Freq: Once | INTRAVENOUS | Status: AC
  Administered 2020-09-14: 200 mg via INTRAVENOUS
  Filled 2020-09-14: qty 8

## 2020-09-14 MED ORDER — SODIUM CHLORIDE 0.9 % IV SOLN
Freq: Once | INTRAVENOUS | Status: AC
  Filled 2020-09-14: qty 250

## 2020-09-14 NOTE — Progress Notes (Signed)
Pt here for follow up. No new concerns voiced.   

## 2020-09-14 NOTE — Patient Instructions (Signed)
[facility]  Discharge Instructions: Thank you for choosing [facility] to provide your oncology and hematology care.  If you have a lab appointment with the Cancer Center, please go directly to the Cancer Center and check in at the registration area.  Wear comfortable clothing and clothing appropriate for easy access to any Portacath or PICC line.   We strive to give you quality time with your provider. You may need to reschedule your appointment if you arrive late (15 or more minutes).  Arriving late affects you and other patients whose appointments are after yours.  Also, if you miss three or more appointments without notifying the office, you may be dismissed from the clinic at the provider's discretion.      For prescription refill requests, have your pharmacy contact our office and allow 72 hours for refills to be completed.    Today you received the following chemotherapy and/or immunotherapy agents Keytruda Pembrolizumab injection What is this medicine? PEMBROLIZUMAB (pem broe liz ue mab) is a monoclonal antibody. It is used to treat certain types of cancer. This medicine may be used for other purposes; ask your health care provider or pharmacist if you have questions. COMMON BRAND NAME(S): Keytruda What should I tell my health care provider before I take this medicine? They need to know if you have any of these conditions:  autoimmune diseases like Crohn's disease, ulcerative colitis, or lupus  have had or planning to have an allogeneic stem cell transplant (uses someone else's stem cells)  history of organ transplant  history of chest radiation  nervous system problems like myasthenia gravis or Guillain-Barre syndrome  an unusual or allergic reaction to pembrolizumab, other medicines, foods, dyes, or preservatives  pregnant or trying to get pregnant  breast-feeding How should I use this medicine? This medicine is for  infusion into a vein. It is given by a health care professional in a hospital or clinic setting. A special MedGuide will be given to you before each treatment. Be sure to read this information carefully each time. Talk to your pediatrician regarding the use of this medicine in children. While this drug may be prescribed for children as young as 6 months for selected conditions, precautions do apply. Overdosage: If you think you have taken too much of this medicine contact a poison control center or emergency room at once. NOTE: This medicine is only for you. Do not share this medicine with others. What if I miss a dose? It is important not to miss your dose. Call your doctor or health care professional if you are unable to keep an appointment. What may interact with this medicine? Interactions have not been studied. This list may not describe all possible interactions. Give your health care provider a list of all the medicines, herbs, non-prescription drugs, or dietary supplements you use. Also tell them if you smoke, drink alcohol, or use illegal drugs. Some items may interact with your medicine. What should I watch for while using this medicine? Your condition will be monitored carefully while you are receiving this medicine. You may need blood work done while you are taking this medicine. Do not become pregnant while taking this medicine or for 4 months after stopping it. Women should inform their doctor if they wish to become pregnant or think they might be pregnant. There is a potential for serious side effects to an unborn child. Talk to your health care professional or pharmacist for more information. Do not   breast-feed an infant while taking this medicine or for 4 months after the last dose. What side effects may I notice from receiving this medicine? Side effects that you should report to your doctor or health care professional as soon as possible:  allergic reactions like skin rash,  itching or hives, swelling of the face, lips, or tongue  bloody or black, tarry  breathing problems  changes in vision  chest pain  chills  confusion  constipation  cough  diarrhea  dizziness or feeling faint or lightheaded  fast or irregular heartbeat  fever  flushing  joint pain  low blood counts - this medicine may decrease the number of white blood cells, red blood cells and platelets. You may be at increased risk for infections and bleeding.  muscle pain  muscle weakness  pain, tingling, numbness in the hands or feet  persistent headache  redness, blistering, peeling or loosening of the skin, including inside the mouth  signs and symptoms of high blood sugar such as dizziness; dry mouth; dry skin; fruity breath; nausea; stomach pain; increased hunger or thirst; increased urination  signs and symptoms of kidney injury like trouble passing urine or change in the amount of urine  signs and symptoms of liver injury like dark urine, light-colored stools, loss of appetite, nausea, right upper belly pain, yellowing of the eyes or skin  sweating  swollen lymph nodes  weight loss Side effects that usually do not require medical attention (report to your doctor or health care professional if they continue or are bothersome):  decreased appetite  hair loss  tiredness This list may not describe all possible side effects. Call your doctor for medical advice about side effects. You may report side effects to FDA at 1-800-FDA-1088. Where should I keep my medicine? This drug is given in a hospital or clinic and will not be stored at home. NOTE: This sheet is a summary. It may not cover all possible information. If you have questions about this medicine, talk to your doctor, pharmacist, or health care provider.  2021 Elsevier/Gold Standard (2019-04-02 21:44:53)       To help prevent nausea and vomiting after your treatment, we encourage you to take your nausea  medication as directed.  BELOW ARE SYMPTOMS THAT SHOULD BE REPORTED IMMEDIATELY: . *FEVER GREATER THAN 100.4 F (38 C) OR HIGHER . *CHILLS OR SWEATING . *NAUSEA AND VOMITING THAT IS NOT CONTROLLED WITH YOUR NAUSEA MEDICATION . *UNUSUAL SHORTNESS OF BREATH . *UNUSUAL BRUISING OR BLEEDING . *URINARY PROBLEMS (pain or burning when urinating, or frequent urination) . *BOWEL PROBLEMS (unusual diarrhea, constipation, pain near the anus) . TENDERNESS IN MOUTH AND THROAT WITH OR WITHOUT PRESENCE OF ULCERS (sore throat, sores in mouth, or a toothache) . UNUSUAL RASH, SWELLING OR PAIN  . UNUSUAL VAGINAL DISCHARGE OR ITCHING   Items with * indicate a potential emergency and should be followed up as soon as possible or go to the Emergency Department if any problems should occur.  Please show the CHEMOTHERAPY ALERT CARD or IMMUNOTHERAPY ALERT CARD at check-in to the Emergency Department and triage nurse.  Should you have questions after your visit or need to cancel or reschedule your appointment, please contact [facility] [place] [facility]  [phone] and follow the prompts.  Office hours are 8:00 a.m. to 4:30 p.m. Monday - Friday. Please note that voicemails left after 4:00 p.m. may not be returned until the following business day.  We are closed weekends and major holidays. You   have access to a nurse at all times for urgent questions. Please call the main number to the clinic [phone] and follow the prompts.  For any non-urgent questions, you may also contact your provider using MyChart. We now offer e-Visits for anyone 18 and older to request care online for non-urgent symptoms. For details visit [email or web address].   Also download the MyChart app! Go to the app store, search "MyChart", open the app, select , and log in with your MyChart username and password.  Due to Covid, a mask is required upon entering the hospital/clinic. If you do not have a mask, one  will be given to you upon arrival. For doctor visits, patients may have 1 support person aged 18 or older with them. For treatment visits, patients cannot have anyone with them due to current Covid guidelines and our immunocompromised population.  

## 2020-09-14 NOTE — Progress Notes (Signed)
HR 103. Per Benjamine Mola, RN per Dr. Tasia Catchings, okay to proceed with treatment.

## 2020-09-14 NOTE — Progress Notes (Signed)
Hematology/Oncology follow up note [facility] [phone] [phone]   Patient Care Team: [name], NP as PCP - General [name], [name], MD as PCP - Family Medicine (Family Medicine) [name], MD as Consulting Physician (General Surgery) [name], [name], MD as Attending Physician (Family Medicine) [name], [name], MD as Consulting Physician (Dermatology)  REFERRING PROVIDER: [name], NP  CHIEF COMPLAINTS/REASON FOR VISIT:  Follow-up of kidney cancer  HISTORY OF PRESENTING ILLNESS:   Matthew Woodard is a  55 y.o.  male with PMH listed below was seen in consultation at the request of  [name], NP  for evaluation of kidney cancer Patient's cancer history dated back to February 2020 when he developed gross hematuria with small clots and right-sided flank pain.  Patient had a CT urogram done which showed right 4 cm central enhancing renal mass with invasion into the collecting system.  X-ray of chest was performed to see complete staging which showed no concerning findings. 06/28/2018 patient underwent a cystoscopy, bladder biopsy and a right retrograde pyelogram with intraoperative interpretation, right diagnostic ureteroscopy, right renal pelvis biopsy, right ureteral stent placement.  biopsy showed atypical cell clusters, with extensive crush artifact, nondiagnostic.   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  Surgical margins negative for tumor.pT3a,Nx Patient has been on surveillance after surgery. 10/28/2018 CT abdomen pelvis with contrast showed minimal fluid and or postoperative changes within the right renal fossa.  No evidence of metastatic disease or recurrence.  Patient has left kidney lesion previously characterized as  nonenhancing cyst by multi phasic contrast-enhanced CT. Patient reports an episode of gross hematuria in early December 2020, patient is due for 2-month surveillance CT scan. 04/30/2019 CT abdomen pelvis with contrast showed high attenuation mass at the right uretero vesicle junction, with extension into the bladder.  Bilateral pulmonary nodules, most indicated for metastatic disease.   Patient underwent cystoscopy and transurethral resection of irregular bladder tumor 3 cm.  Mass appeared to emanate from the right ureteral orifice.  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 Patient was referred to me for further work-up and management.  #Staging chest CT with contrast showed numerous bilateral pulmonary nodules.  Compatible with metastatic disease. Bone scan showed no osseous metastatic disease.   #History of tobacco abuse, former smoker.  Quit in 2015.  He denies any hemoptysis, chest pain, shortness of breath today. Patient reports that hematuria has completely resolved.  Denies any pain today. He is accompanied by his wife.  #History of cutaneous psoriasis, mostly on his right hand, currently on weekly methotrexate 12.5 mg with good symptom control. #NGS: [employer] [facility] PD L1 TPS 1%  # patient was referred to ENT for evaluation of headache and sinus pressure.  He had a ENT evaluation and feels that patient does not have sinusitis.  [name] recommended MRI brain to rule out PRES syndrome.  Axitinib has been held MRI brain was done and was negative.   #09/2019 off axitinib and Keytruda since the beginning of May due to transaminitis. Patient was seen and evaluated by gastroenterology [name]. Work-up was negative. Ultrasound liver vascular Doppler is negative. Recommend observation. #11/24/2019, resumed on Keytruda every 3 weeks #01/05/2020, axitinib was resumed 3 mg #01/20/2020, axitinib was discontinued due to recurrence of transaminitis .   Beryle Flock was temporarily held. #03/16/2020,  resumed on Keytruda every 3 weeks.  INTERVAL HISTORY Matthew Woodard is a 55 y.o. male who has above history reviewed by me today presents for follow up visit for management of metastatic RCC. Problems and complaints are listed below:  Status post left knee replacement.  Patient continues to have some left knee pain for which he uses pain medication. Otherwise no new complaints.   Review of Systems  Constitutional: Negative for appetite change, chills, fatigue, fever and unexpected weight change.  HENT:   Negative for hearing loss and voice change.   Eyes: Negative for eye problems and icterus.  Respiratory: Negative for chest tightness, cough and shortness of breath.   Cardiovascular: Negative for chest pain and leg swelling.  Gastrointestinal: Negative for abdominal distention, abdominal pain, blood in stool and nausea.  Endocrine: Negative for hot flashes.  Genitourinary: Negative for difficulty urinating, dysuria and frequency.   Musculoskeletal: Positive for arthralgias.  Skin: Negative for itching and rash.  Neurological: Negative for extremity weakness, headaches, light-headedness and numbness.  Hematological: Negative for adenopathy. Does not bruise/bleed easily.  Psychiatric/Behavioral: Negative for confusion.    MEDICAL HISTORY:  Past Medical History:  Diagnosis Date  . Arthritis    hands, left knee  . Cancer ([place])   . Diabetes mellitus without complication ([place])   . Eczema 01/08/2017  . GERD (gastroesophageal reflux disease)   . History of kidney cancer   . Hyperlipidemia LDL goal <100 11/02/2014  . Hypertension   . Kidney stone    YEAR H/O HEMATURIA  . Psoriasis   . Wears dentures    full upper, partial lower.  Has, does not wear    SURGICAL HISTORY: Past Surgical History:  Procedure Laterality Date  . COLONOSCOPY WITH PROPOFOL N/A 02/23/2017   Procedure: COLONOSCOPY WITH PROPOFOL;  Surgeon: [name], MD;   Location: [place];  Service: Endoscopy;  Laterality: N/A;  Diabetic - oral meds  . CYST EXCISION  Aug. 2016   on back  . CYSTOSCOPY W/ RETROGRADES Right 06/28/2018   Procedure: CYSTOSCOPY WITH RETROGRADE PYELOGRAM;  Surgeon: Billey Co, MD;  Location: [facility];  Service: Urology;  Laterality: Right;  . CYSTOSCOPY W/ URETERAL STENT REMOVAL Right 07/22/2018   Procedure: CYSTOSCOPY WITH STENT REMOVAL;  Surgeon: Billey Co, MD;  Location: [facility];  Service: Urology;  Laterality: Right;  . CYSTOSCOPY WITH BIOPSY Right 06/28/2018   Procedure: CYSTOSCOPY WITH RENAL BIOPSY;  Surgeon: Billey Co, MD;  Location: [facility];  Service: Urology;  Laterality: Right;  . CYSTOSCOPY WITH URETEROSCOPY AND STENT PLACEMENT Right 06/28/2018   Procedure: CYSTOSCOPY WITH URETEROSCOPY AND STENT PLACEMENT;  Surgeon: Billey Co, MD;  Location: [facility];  Service: Urology;  Laterality: Right;  . FULGURATION OF BLADDER TUMOR N/A 06/28/2018   Procedure: CYSTOSCOPY BLADDER BIOPSY, FULGERATION OF BLADDER;  Surgeon: Billey Co, MD;  Location: [facility];  Service: Urology;  Laterality: N/A;  . KNEE SURGERY Left   . LAPAROSCOPIC NEPHRECTOMY, HAND ASSISTED Right 07/22/2018   Procedure: HAND ASSISTED LAPAROSCOPIC NEPHRECTOMY;  Surgeon: Billey Co, MD;  Location: [facility];  Service: Urology;  Laterality: Right;  . NASAL SINUS SURGERY  03/15  . POLYPECTOMY N/A 02/23/2017   Procedure: POLYPECTOMY;  Surgeon: [name], MD;  Location: [place];  Service: Endoscopy;  Laterality: N/A;  . tooth abstraction    . TRANSURETHRAL RESECTION OF BLADDER TUMOR N/A 05/12/2019   Procedure: TRANSURETHRAL RESECTION OF BLADDER TUMOR (TURBT);  Surgeon: Billey Co,  MD;  Location: [facility];  Service: Urology;  Laterality: N/A;  . VARICOCELE EXCISION      SOCIAL HISTORY: Social History   Socioeconomic History  . Marital status: Married    Spouse name: Not on file  . Number of children: Not  on file  . Years of education: Not on file  . Highest education level: Not on file  Occupational History  . Not on file  Tobacco Use  . Smoking status: Former Smoker    Types: Cigarettes    Quit date: 06/15/2013    Years since quitting: 7.2  . Smokeless tobacco: Never Used  Vaping Use  . Vaping Use: Never used  Substance and Sexual Activity  . Alcohol use: No    Alcohol/week: 0.0 standard drinks  . Drug use: No  . Sexual activity: Yes  Other Topics Concern  . Not on file  Social History Narrative  . Not on file   Social Determinants of Health   Financial Resource Strain: Not on file  Food Insecurity: Not on file  Transportation Needs: Not on file  Physical Activity: Not on file  Stress: Not on file  Social Connections: Not on file  Intimate Partner Violence: Not on file    FAMILY HISTORY: Family History  Problem Relation Age of Onset  . Heart disease Father   . Hypertension Father   . COPD Father   . Colon cancer Maternal Grandmother   . Heart attack Paternal Grandfather     ALLERGIES:  is allergic to glucotrol [glipizide].  MEDICATIONS:  Current Outpatient Medications  Medication Sig Dispense Refill  . aspirin 81 MG chewable tablet CHEW AND SWALLOW 1 TABLET BY MOUTH TWICE DAILY    . clobetasol cream (TEMOVATE) 4.40 % Apply 1 application topically 2 (two) times daily. To affected skin; too strong for face, groin, underarms 30 g 2  . Glucose Blood (ACCU-CHEK AVIVA PLUS VI) by In Vitro route.    Marland Kitchen JARDIANCE 10 MG TABS tablet TAKE 1 TABLET BY MOUTH DAILY (Patient taking differently: Take 10 mg by mouth daily.) 30 tablet 2  . lisinopril (PRINIVIL,ZESTRIL) 10 MG tablet TAKE 1 TABLET(10 MG) BY MOUTH DAILY (Patient taking differently: Take 10 mg by mouth daily.) 90 tablet 1  . loperamide (IMODIUM) 2 MG capsule Take 1 capsule (2 mg total) by mouth See admin instructions. With onset of loose stool, take 4mg  followed by 2mg  every 2 hours until 12 hours have passed without  loose bowel movement. Maximum: 16 mg/day 60 capsule 0  . metFORMIN (GLUCOPHAGE-XR) 500 MG 24 hr tablet Take 3 pills by mouth daily for 4-5 days, then increase to 4 pills daily (if tolerated) (Patient taking differently: Take 2,000 mg by mouth daily with breakfast.) 360 tablet 0  . Multiple Vitamin (MULTIVITAMIN) tablet Take 1 tablet by mouth daily.    . Omeprazole (PRILOSEC PO) Take 20 mg by mouth daily as needed (heart burn).     . Oxycodone HCl 10 MG TABS TAKE 1 TABLET BY MOUTH EVERY 4 HOURS    . OZEMPIC, 0.25 OR 0.5 MG/DOSE, 2 MG/1.5ML SOPN once a week.     . prochlorperazine (COMPAZINE) 10 MG tablet Take 1 tablet (10 mg total) by mouth every 6 (six) hours as needed for nausea or vomiting. 15 tablet 0  . rosuvastatin (CRESTOR) 10 MG tablet Take 1 tablet (10 mg total) by mouth at bedtime. 90 tablet 1   No current facility-administered medications for this visit.     PHYSICAL EXAMINATION:  ECOG PERFORMANCE STATUS: 1 - Symptomatic but completely ambulatory Vitals:   09/14/20 0853  BP: 135/82  Pulse: (!) 103  Resp: 18  Temp: (!) 96.5 F (35.8 C)  SpO2: 100%   Filed Weights   09/14/20 0853  Weight: 195 lb 14.4 oz (88.9 kg)    Physical Exam Constitutional:      General: He is not in acute distress.    Comments: Patient walks with a walker  HENT:     Head: Normocephalic and atraumatic.  Eyes:     General: No scleral icterus.    Pupils: Pupils are equal, round, and reactive to light.  Cardiovascular:     Rate and Rhythm: Normal rate and regular rhythm.     Heart sounds: Normal heart sounds.  Pulmonary:     Effort: Pulmonary effort is normal. No respiratory distress.     Breath sounds: No wheezing.  Abdominal:     General: Bowel sounds are normal. There is no distension.     Palpations: Abdomen is soft. There is no mass.     Tenderness: There is no abdominal tenderness.     Comments: Right anterior abdominal wall bulging, worse with Valsalva and standing position.   Musculoskeletal:        General: No deformity. Normal range of motion.     Cervical back: Normal range of motion and neck supple.     Comments: Left knee status post knee replacement.  Some residual swelling  Skin:    General: Skin is warm and dry.     Findings: No erythema or rash.  Neurological:     Mental Status: He is alert and oriented to person, place, and time. Mental status is at baseline.     Cranial Nerves: No cranial nerve deficit.     Coordination: Coordination normal.  Psychiatric:        Mood and Affect: Mood normal.     LABORATORY DATA:  I have reviewed the data as listed Lab Results  Component Value Date   WBC 8.1 09/14/2020   HGB 13.8 09/14/2020   HCT 42.3 09/14/2020   MCV 90.2 09/14/2020   PLT 319 09/14/2020   Recent Labs    12/25/19 1031 12/25/19 1125 01/05/20 0840 01/12/20 1105 01/26/20 0829 02/12/20 1051 02/19/20 1115 02/27/20 1055 03/16/20 0830 07/27/20 0827 08/24/20 0831 09/14/20 0838  NA 134*  --  134*  --  135  --   --   --    < > 137 135 135  K 4.7  --  4.4  --  4.6  --   --   --    < > 4.3 4.9 4.3  CL 104  --  101  --  102  --   --   --    < > 102 100 98  CO2 24  --  27  --  24  --   --   --    < > 26 25 25   GLUCOSE 98  --  94  --  96  --   --   --    < > 128* 151* 155*  BUN 24*  --  26*  --  23*  --   --   --    < > 22* 24* 21*  CREATININE 1.04  --  1.20  --  1.10  --   --   --    < > 1.23 1.01 0.96  CALCIUM 8.8*  --  9.1  --  9.2  --   --   --    < > 9.5 10.0 9.7  GFRNONAA >60  --  >60  --  >60  --   --   --    < > >60 >60 >60  GFRAA >60  --  >60  --  >60  --   --   --   --   --   --   --   PROT 6.8   < > 6.9   < > 6.6 6.7 6.8 6.9   < > 7.3 7.9 8.1  ALBUMIN 4.0   < > 4.2   < > 3.9 4.1 4.0 4.2   < > 4.3 4.3 4.1  AST 24   < > 23   < > 183* 130* 99* 67*   < > 19 24 21   ALT 22   < > 21   < > 336* 266* 203* 127*   < > 19 22 20   ALKPHOS 46   < > 50   < > 60 66 64 57   < > 67 95 90  BILITOT 0.8   < > 0.7   < > 0.9 1.2 1.1 1.0   < > 0.7  0.6 0.6  BILIDIR  --    < >  --    < >  --  0.3* 0.2 0.2  --   --   --   --   IBILI  --    < >  --    < >  --  0.9 0.9 0.8  --   --   --   --    < > = values in this interval not displayed.   Iron/TIBC/Ferritin/ %Sat    Component Value Date/Time   IRON 118 09/25/2019 1115   TIBC 454 (H) 09/25/2019 1115   FERRITIN 236 09/25/2019 1115   IRONPCTSAT 26 09/25/2019 1115      RADIOGRAPHIC STUDIES: I have personally reviewed the radiological images as listed and agreed with the findings in the report.  No results found.    ASSESSMENT & PLAN:  1. Clear cell carcinoma of right kidney ([place])   2. Encounter for antineoplastic immunotherapy   3. 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 Labs are reviewed and discussed with the patient counts are acceptable to proceed with Keytruda maintenance today Obtain CT chest abdomen pelvis surveillance scan prior to next visit. Small nodules are stable on recent surveillance CT scan.  Attention on follow-up,   Recent history of knee replacement.  Follow-up with orthopedic surgeon.  Patient is on aspirin for DVT prophylaxis. . Follow-up in 3 weeks We spent sufficient time to discuss many aspect of care, questions were answered to patient's satisfaction. All questions were answered. The patient knows to call the clinic with any problems questions or concerns.  Earlie Server, MD, PhD Hematology Oncology [facility] at [facility] Pager- [phone] 09/14/2020

## 2020-09-28 ENCOUNTER — Ambulatory Visit: Payer: BC Managed Care – PPO

## 2020-09-29 ENCOUNTER — Ambulatory Visit
Admission: RE | Admit: 2020-09-29 | Discharge: 2020-09-29 | Disposition: A | Discharge status: Home / Self Care | Payer: BC Managed Care – PPO | Source: Ambulatory Visit | Attending: Oncology | Admitting: Oncology

## 2020-09-29 ENCOUNTER — Other Ambulatory Visit: Payer: Self-pay

## 2020-09-29 DIAGNOSIS — C641 Malignant neoplasm of right kidney, except renal pelvis: Secondary | ICD-10-CM | POA: Insufficient documentation

## 2020-10-05 ENCOUNTER — Encounter: Payer: Self-pay | Admitting: Oncology

## 2020-10-05 ENCOUNTER — Inpatient Hospital Stay: Payer: BC Managed Care – PPO

## 2020-10-05 ENCOUNTER — Inpatient Hospital Stay (HOSPITAL_BASED_OUTPATIENT_CLINIC_OR_DEPARTMENT_OTHER): Payer: BC Managed Care – PPO | Admitting: Oncology

## 2020-10-05 VITALS — BP 112/72 | HR 94 | Temp 96.4°F | Resp 18 | Wt 201.9 lb

## 2020-10-05 DIAGNOSIS — C641 Malignant neoplasm of right kidney, except renal pelvis: Secondary | ICD-10-CM

## 2020-10-05 DIAGNOSIS — C7800 Secondary malignant neoplasm of unspecified lung: Secondary | ICD-10-CM

## 2020-10-05 DIAGNOSIS — Z5112 Encounter for antineoplastic immunotherapy: Secondary | ICD-10-CM

## 2020-10-05 LAB — COMPREHENSIVE METABOLIC PANEL
ALT: 21 U/L (ref 0–44)
AST: 20 U/L (ref 15–41)
Albumin: 4.2 g/dL (ref 3.5–5.0)
Alkaline Phosphatase: 76 U/L (ref 38–126)
Anion gap: 12 (ref 5–15)
BUN: 24 mg/dL — ABNORMAL HIGH (ref 6–20)
CO2: 26 mmol/L (ref 22–32)
Calcium: 9.7 mg/dL (ref 8.9–10.3)
Chloride: 98 mmol/L (ref 98–111)
Creatinine, Ser: 1.1 mg/dL (ref 0.61–1.24)
GFR, Estimated: >60 mL/min (ref >60)
Glucose, Bld: 148 mg/dL — ABNORMAL HIGH (ref 70–99)
Potassium: 4.5 mmol/L (ref 3.5–5.1)
Sodium: 136 mmol/L (ref 135–145)
Total Bilirubin: 0.8 mg/dL (ref 0.3–1.2)
Total Protein: 7.6 g/dL (ref 6.5–8.1)

## 2020-10-05 LAB — CBC WITH DIFFERENTIAL/PLATELET
Abs Immature Granulocytes: 0.04 10*3/uL (ref 0.00–0.07)
Basophils Absolute: 0 10*3/uL (ref 0.0–0.1)
Basophils Relative: 0 %
Eosinophils Absolute: 0.4 10*3/uL (ref 0.0–0.5)
Eosinophils Relative: 6 %
HCT: 43.2 % (ref 39.0–52.0)
Hemoglobin: 14.3 g/dL (ref 13.0–17.0)
Immature Granulocytes: 1 %
Lymphocytes Relative: 31 %
Lymphs Abs: 2.3 10*3/uL (ref 0.7–4.0)
MCH: 29.2 pg (ref 26.0–34.0)
MCHC: 33.1 g/dL (ref 30.0–36.0)
MCV: 88.2 fL (ref 80.0–100.0)
Monocytes Absolute: 0.6 10*3/uL (ref 0.1–1.0)
Monocytes Relative: 9 %
Neutro Abs: 3.9 10*3/uL (ref 1.7–7.7)
Neutrophils Relative %: 53 %
Platelets: 255 10*3/uL (ref 150–400)
RBC: 4.9 MIL/uL (ref 4.22–5.81)
RDW: 12.7 % (ref 11.5–15.5)
WBC: 7.3 10*3/uL (ref 4.0–10.5)
nRBC: 0 % (ref 0.0–0.2)

## 2020-10-05 LAB — TSH: TSH: 1.227 u[IU]/mL (ref 0.350–4.500)

## 2020-10-05 MED ORDER — SODIUM CHLORIDE 0.9 % IV SOLN
Freq: Once | INTRAVENOUS | Status: AC
  Filled 2020-10-05: qty 250

## 2020-10-05 MED ORDER — SODIUM CHLORIDE 0.9 % IV SOLN
200.0000 mg | Freq: Once | INTRAVENOUS | Status: AC
  Administered 2020-10-05: 200 mg via INTRAVENOUS
  Filled 2020-10-05: qty 8

## 2020-10-05 MED ORDER — TRAZODONE HCL 50 MG PO TABS: 50.0000 mg | ORAL_TABLET | Freq: Every day | ORAL | 0 refills | Status: DC

## 2020-10-05 NOTE — Patient Instructions (Signed)
[facility]    Discharge Instructions: Thank you for choosing [facility] to provide your oncology and hematology care.  If you have a lab appointment with the Cancer Center, please go directly to the Cancer Center and check in at the registration area.  Wear comfortable clothing and clothing appropriate for easy access to any Portacath or PICC line.   We strive to give you quality time with your provider. You may need to reschedule your appointment if you arrive late (15 or more minutes).  Arriving late affects you and other patients whose appointments are after yours.  Also, if you miss three or more appointments without notifying the office, you may be dismissed from the clinic at the provider's discretion.      For prescription refill requests, have your pharmacy contact our office and allow 72 hours for refills to be completed.    Today you received the following chemotherapy and/or immunotherapy agents - keytruda      To help prevent nausea and vomiting after your treatment, we encourage you to take your nausea medication as directed.  BELOW ARE SYMPTOMS THAT SHOULD BE REPORTED IMMEDIATELY: . *FEVER GREATER THAN 100.4 F (38 C) OR HIGHER . *CHILLS OR SWEATING . *NAUSEA AND VOMITING THAT IS NOT CONTROLLED WITH YOUR NAUSEA MEDICATION . *UNUSUAL SHORTNESS OF BREATH . *UNUSUAL BRUISING OR BLEEDING . *URINARY PROBLEMS (pain or burning when urinating, or frequent urination) . *BOWEL PROBLEMS (unusual diarrhea, constipation, pain near the anus) . TENDERNESS IN MOUTH AND THROAT WITH OR WITHOUT PRESENCE OF ULCERS (sore throat, sores in mouth, or a toothache) . UNUSUAL RASH, SWELLING OR PAIN  . UNUSUAL VAGINAL DISCHARGE OR ITCHING   Items with * indicate a potential emergency and should be followed up as soon as possible or go to the Emergency Department if any problems should occur.  Please show the CHEMOTHERAPY ALERT CARD or IMMUNOTHERAPY  ALERT CARD at check-in to the Emergency Department and triage nurse.  Should you have questions after your visit or need to cancel or reschedule your appointment, please contact [facility] [place] [facility]  [phone] and follow the prompts.  Office hours are 8:00 a.m. to 4:30 p.m. Monday - Friday. Please note that voicemails left after 4:00 p.m. may not be returned until the following business day.  We are closed weekends and major holidays. You have access to a nurse at all times for urgent questions. Please call the main number to the clinic [phone] and follow the prompts.  For any non-urgent questions, you may also contact your provider using MyChart. We now offer e-Visits for anyone 18 and older to request care online for non-urgent symptoms. For details visit [email or web address].   Also download the MyChart app! Go to the app store, search "MyChart", open the app, select [facility], and log in with your MyChart username and password.  Due to Covid, a mask is required upon entering the hospital/clinic. If you do not have a mask, one will be given to you upon arrival. For doctor visits, patients may have 1 support person aged 18 or older with them. For treatment visits, patients cannot have anyone with them due to current Covid guidelines and our immunocompromised population.   Pembrolizumab injection What is this medicine? PEMBROLIZUMAB (pem broe liz ue mab) is a monoclonal antibody. It is used to treat certain types of cancer. This medicine may be used for other purposes; ask your health care provider or pharmacist if you   have questions. COMMON BRAND NAME(S): Keytruda What should I tell my health care provider before I take this medicine? They need to know if you have any of these conditions:  autoimmune diseases like Crohn's disease, ulcerative colitis, or lupus  have had or planning to have an allogeneic stem cell transplant (uses someone else's stem  cells)  history of organ transplant  history of chest radiation  nervous system problems like myasthenia gravis or Guillain-Barre syndrome  an unusual or allergic reaction to pembrolizumab, other medicines, foods, dyes, or preservatives  pregnant or trying to get pregnant  breast-feeding How should I use this medicine? This medicine is for infusion into a vein. It is given by a health care professional in a hospital or clinic setting. A special MedGuide will be given to you before each treatment. Be sure to read this information carefully each time. Talk to your pediatrician regarding the use of this medicine in children. While this drug may be prescribed for children as young as 6 months for selected conditions, precautions do apply. Overdosage: If you think you have taken too much of this medicine contact a poison control center or emergency room at once. NOTE: This medicine is only for you. Do not share this medicine with others. What if I miss a dose? It is important not to miss your dose. Call your doctor or health care professional if you are unable to keep an appointment. What may interact with this medicine? Interactions have not been studied. This list may not describe all possible interactions. Give your health care provider a list of all the medicines, herbs, non-prescription drugs, or dietary supplements you use. Also tell them if you smoke, drink alcohol, or use illegal drugs. Some items may interact with your medicine. What should I watch for while using this medicine? Your condition will be monitored carefully while you are receiving this medicine. You may need blood work done while you are taking this medicine. Do not become pregnant while taking this medicine or for 4 months after stopping it. Women should inform their doctor if they wish to become pregnant or think they might be pregnant. There is a potential for serious side effects to an unborn child. Talk to your  health care professional or pharmacist for more information. Do not breast-feed an infant while taking this medicine or for 4 months after the last dose. What side effects may I notice from receiving this medicine? Side effects that you should report to your doctor or health care professional as soon as possible:  allergic reactions like skin rash, itching or hives, swelling of the face, lips, or tongue  bloody or black, tarry  breathing problems  changes in vision  chest pain  chills  confusion  constipation  cough  diarrhea  dizziness or feeling faint or lightheaded  fast or irregular heartbeat  fever  flushing  joint pain  low blood counts - this medicine may decrease the number of white blood cells, red blood cells and platelets. You may be at increased risk for infections and bleeding.  muscle pain  muscle weakness  pain, tingling, numbness in the hands or feet  persistent headache  redness, blistering, peeling or loosening of the skin, including inside the mouth  signs and symptoms of high blood sugar such as dizziness; dry mouth; dry skin; fruity breath; nausea; stomach pain; increased hunger or thirst; increased urination  signs and symptoms of kidney injury like trouble passing urine or change in the amount of urine    signs and symptoms of liver injury like dark urine, light-colored stools, loss of appetite, nausea, right upper belly pain, yellowing of the eyes or skin  sweating  swollen lymph nodes  weight loss Side effects that usually do not require medical attention (report to your doctor or health care professional if they continue or are bothersome):  decreased appetite  hair loss  tiredness This list may not describe all possible side effects. Call your doctor for medical advice about side effects. You may report side effects to FDA at 1-800-FDA-1088. Where should I keep my medicine? This drug is given in a hospital or clinic and will  not be stored at home. NOTE: This sheet is a summary. It may not cover all possible information. If you have questions about this medicine, talk to your doctor, pharmacist, or health care provider.  2021 Elsevier/Gold Standard (2019-04-02 21:44:53)  

## 2020-10-05 NOTE — Progress Notes (Signed)
Patient here for follow up. Pt reports having a hard time sleeping.

## 2020-10-05 NOTE — Progress Notes (Signed)
Hematology/Oncology follow up note [facility] Telephone:([phone] Fax:(336) [phone]   Patient Care Team: Danae Orleans, MD as PCP - General (Internal Medicine) [name], [name], MD as PCP - Family Medicine (Family Medicine) [name], MD as Consulting Physician (General Surgery) [name], [name], MD as Attending Physician (Family Medicine) [name], [name], MD as Consulting Physician (Dermatology)  REFERRING PROVIDER: [name], NP  CHIEF COMPLAINTS/REASON FOR VISIT:  Follow-up of kidney cancer  HISTORY OF PRESENTING ILLNESS:   Matthew Woodard is a  55 y.o.  male with PMH listed below was seen in consultation at the request of  [name], NP  for evaluation of kidney cancer Patient's cancer history dated back to February 2020 when he developed gross hematuria with small clots and right-sided flank pain.  Patient had a CT urogram done which showed right 4 cm central enhancing renal mass with invasion into the collecting system.  X-ray of chest was performed to see complete staging which showed no concerning findings. 06/28/2018 patient underwent a cystoscopy, bladder biopsy and a right retrograde pyelogram with intraoperative interpretation, right diagnostic ureteroscopy, right renal pelvis biopsy, right ureteral stent placement.  biopsy showed atypical cell clusters, with extensive crush artifact, nondiagnostic.   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  Surgical margins negative for tumor.pT3a,Nx Patient has been on surveillance after surgery. 10/28/2018 CT abdomen pelvis with contrast showed minimal fluid and or postoperative changes within the right renal fossa.  No evidence of metastatic disease or recurrence.  Patient has left kidney lesion previously  characterized as nonenhancing cyst by multi phasic contrast-enhanced CT. Patient reports an episode of gross hematuria in early December 2020, patient is due for 65-month surveillance CT scan. 04/30/2019 CT abdomen pelvis with contrast showed high attenuation mass at the right uretero vesicle junction, with extension into the bladder.  Bilateral pulmonary nodules, most indicated for metastatic disease.   Patient underwent cystoscopy and transurethral resection of irregular bladder tumor 3 cm.  Mass appeared to emanate from the right ureteral orifice.  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 Patient was referred to me for further work-up and management.  #Staging chest CT with contrast showed numerous bilateral pulmonary nodules.  Compatible with metastatic disease. Bone scan showed no osseous metastatic disease.   #History of tobacco abuse, former smoker.  Quit in 2015.  He denies any hemoptysis, chest pain, shortness of breath today. Patient reports that hematuria has completely resolved.  Denies any pain today. He is accompanied by his wife.  #History of cutaneous psoriasis, mostly on his right hand, currently on weekly methotrexate 12.5 mg with good symptom control. #NGS: [facility] PD L1 TPS 1%  # patient was referred to ENT for evaluation of headache and sinus pressure.  He had a ENT evaluation and feels that patient does not have sinusitis.  [name] recommended MRI brain to rule out PRES syndrome.  Axitinib has been held MRI brain was done and was negative.   #09/2019 off axitinib and Keytruda since the beginning of May due to transaminitis. Patient was seen and evaluated by gastroenterology [name]. Work-up was negative. Ultrasound liver vascular Doppler is negative. Recommend observation. #11/24/2019, resumed on Keytruda every 3 weeks #01/05/2020, axitinib was resumed 3 mg #01/20/2020, axitinib was discontinued due to recurrence of  transaminitis  Beryle Flock was temporarily held. #  03/16/2020, resumed on Keytruda every 3 weeks.  INTERVAL HISTORY Matthew Woodard is a 55 y.o. male who has above history reviewed by me today presents for follow up visit for management of metastatic RCC. Problems and complaints are listed below:  Status post left knee replacement.  Continues to have some discomfort around his knee.  Voltaren cream has helped the swelling and the pain.  He walks with a walker.  Pain has improved, with current pain regimen.  Reports insomnia which is a chronic issue for him, recently exacerbated due to the surgery.   Review of Systems  Constitutional: Negative for appetite change, chills, fatigue, fever and unexpected weight change.  HENT:   Negative for hearing loss and voice change.   Eyes: Negative for eye problems and icterus.  Respiratory: Negative for chest tightness, cough and shortness of breath.   Cardiovascular: Negative for chest pain and leg swelling.  Gastrointestinal: Negative for abdominal distention, abdominal pain, blood in stool and nausea.  Endocrine: Negative for hot flashes.  Genitourinary: Negative for difficulty urinating, dysuria and frequency.   Musculoskeletal: Positive for arthralgias.  Skin: Negative for itching and rash.  Neurological: Negative for extremity weakness, headaches, light-headedness and numbness.  Hematological: Negative for adenopathy. Does not bruise/bleed easily.  Psychiatric/Behavioral: Negative for confusion.    MEDICAL HISTORY:  Past Medical History:  Diagnosis Date  . Arthritis    hands, left knee  . Cancer ([place])   . Diabetes mellitus without complication ([place])   . Eczema 01/08/2017  . GERD (gastroesophageal reflux disease)   . History of kidney cancer   . Hyperlipidemia LDL goal <100 11/02/2014  . Hypertension   . Kidney stone    YEAR H/O HEMATURIA  . Psoriasis   . Wears dentures    full upper, partial lower.  Has, does not wear    SURGICAL  HISTORY: Past Surgical History:  Procedure Laterality Date  . COLONOSCOPY WITH PROPOFOL N/A 02/23/2017   Procedure: COLONOSCOPY WITH PROPOFOL;  Surgeon: [name], MD;  Location: [place];  Service: Endoscopy;  Laterality: N/A;  Diabetic - oral meds  . CYST EXCISION  Aug. 2016   on back  . CYSTOSCOPY W/ RETROGRADES Right 06/28/2018   Procedure: CYSTOSCOPY WITH RETROGRADE PYELOGRAM;  Surgeon: Billey Co, MD;  Location: [facility];  Service: Urology;  Laterality: Right;  . CYSTOSCOPY W/ URETERAL STENT REMOVAL Right 07/22/2018   Procedure: CYSTOSCOPY WITH STENT REMOVAL;  Surgeon: Billey Co, MD;  Location: [facility];  Service: Urology;  Laterality: Right;  . CYSTOSCOPY WITH BIOPSY Right 06/28/2018   Procedure: CYSTOSCOPY WITH RENAL BIOPSY;  Surgeon: Billey Co, MD;  Location: [facility];  Service: Urology;  Laterality: Right;  . CYSTOSCOPY WITH URETEROSCOPY AND STENT PLACEMENT Right 06/28/2018   Procedure: CYSTOSCOPY WITH URETEROSCOPY AND STENT PLACEMENT;  Surgeon: Billey Co, MD;  Location: [facility];  Service: Urology;  Laterality: Right;  . FULGURATION OF BLADDER TUMOR N/A 06/28/2018   Procedure: CYSTOSCOPY BLADDER BIOPSY, FULGERATION OF BLADDER;  Surgeon: Billey Co, MD;  Location: [facility];  Service: Urology;  Laterality: N/A;  . KNEE SURGERY Left   . LAPAROSCOPIC NEPHRECTOMY, HAND ASSISTED Right 07/22/2018   Procedure: HAND ASSISTED LAPAROSCOPIC NEPHRECTOMY;  Surgeon: Billey Co, MD;  Location: [facility];  Service: Urology;  Laterality: Right;  . NASAL SINUS SURGERY  03/15  . POLYPECTOMY N/A 02/23/2017   Procedure: POLYPECTOMY;  Surgeon: [name], MD;  Location: [place];  Service: Endoscopy;  Laterality: N/A;  . tooth abstraction    . TRANSURETHRAL RESECTION OF BLADDER TUMOR N/A 05/12/2019   Procedure: TRANSURETHRAL RESECTION OF BLADDER TUMOR (TURBT);  Surgeon: Billey Co, MD;  Location: [facility];  Service: Urology;  Laterality:  N/A;  . VARICOCELE EXCISION      SOCIAL HISTORY: Social History   Socioeconomic History  . Marital status: Married    Spouse name: Not on file  . Number of children: Not on file  . Years of education: Not on file  . Highest education level: Not on file  Occupational History  . Not on file  Tobacco Use  . Smoking status: Former Smoker    Types: Cigarettes    Quit date: 06/15/2013    Years since quitting: 7.3  . Smokeless tobacco: Never Used  Vaping Use  . Vaping Use: Never used  Substance and Sexual Activity  . Alcohol use: No    Alcohol/week: 0.0 standard drinks  . Drug use: No  . Sexual activity: Yes  Other Topics Concern  . Not on file  Social History Narrative  . Not on file   Social Determinants of Health   Financial Resource Strain: Not on file  Food Insecurity: Not on file  Transportation Needs: Not on file  Physical Activity: Not on file  Stress: Not on file  Social Connections: Not on file  Intimate Partner Violence: Not on file    FAMILY HISTORY: Family History  Problem Relation Age of Onset  . Heart disease Father   . Hypertension Father   . COPD Father   . Colon cancer Maternal Grandmother   . Heart attack Paternal Grandfather     ALLERGIES:  is allergic to glucotrol [glipizide].  MEDICATIONS:  Current Outpatient Medications  Medication Sig Dispense Refill  . clobetasol cream (TEMOVATE) 4.66 % Apply 1 application topically 2 (two) times daily. To affected skin; too strong for face, groin, underarms 30 g 2  . Glucose Blood (ACCU-CHEK AVIVA PLUS VI) by In Vitro route.    Marland Kitchen JARDIANCE 10 MG TABS tablet TAKE 1 TABLET BY MOUTH DAILY (Patient taking differently: Take 10 mg by mouth daily.) 30 tablet 2  . lisinopril (PRINIVIL,ZESTRIL) 10 MG tablet TAKE 1 TABLET(10 MG) BY MOUTH DAILY (Patient taking differently: Take 10 mg by mouth daily.) 90 tablet 1  . loperamide (IMODIUM) 2 MG capsule Take 1 capsule (2 mg total) by mouth See admin instructions. With  onset of loose stool, take 4mg  followed by 2mg  every 2 hours until 12 hours have passed without loose bowel movement. Maximum: 16 mg/day 60 capsule 0  . metFORMIN (GLUCOPHAGE-XR) 500 MG 24 hr tablet Take 3 pills by mouth daily for 4-5 days, then increase to 4 pills daily (if tolerated) (Patient taking differently: Take 2,000 mg by mouth daily with breakfast.) 360 tablet 0  . Multiple Vitamin (MULTIVITAMIN) tablet Take 1 tablet by mouth daily.    . Omeprazole (PRILOSEC PO) Take 20 mg by mouth daily as needed (heart burn).     . Oxycodone HCl 10 MG TABS TAKE 1 TABLET BY MOUTH EVERY 4 HOURS    . OZEMPIC, 0.25 OR 0.5 MG/DOSE, 2 MG/1.5ML SOPN once a week.     . prochlorperazine (COMPAZINE) 10 MG tablet Take 1 tablet (10 mg total) by mouth every 6 (six) hours as needed for nausea or vomiting. 15 tablet 0  . rosuvastatin (CRESTOR) 10 MG tablet Take 1 tablet (10 mg total) by mouth at bedtime. 90 tablet 1  .  traZODone (DESYREL) 50 MG tablet Take 1 tablet (50 mg total) by mouth at bedtime. 30 tablet 0  . aspirin 81 MG chewable tablet CHEW AND SWALLOW 1 TABLET BY MOUTH TWICE DAILY (Patient not taking: Reported on 10/05/2020)     No current facility-administered medications for this visit.     PHYSICAL EXAMINATION: ECOG PERFORMANCE STATUS: 1 - Symptomatic but completely ambulatory Vitals:   10/05/20 0852  BP: 112/72  Pulse: 94  Resp: 18  Temp: (!) 96.4 F (35.8 C)   Filed Weights   10/05/20 0852  Weight: 201 lb 14.4 oz (91.6 kg)    Physical Exam Constitutional:      General: He is not in acute distress.    Comments: Patient walks with a walker  HENT:     Head: Normocephalic and atraumatic.  Eyes:     General: No scleral icterus.    Pupils: Pupils are equal, round, and reactive to light.  Cardiovascular:     Rate and Rhythm: Normal rate and regular rhythm.     Heart sounds: Normal heart sounds.  Pulmonary:     Effort: Pulmonary effort is normal. No respiratory distress.     Breath  sounds: No wheezing.  Abdominal:     General: Bowel sounds are normal. There is no distension.     Palpations: Abdomen is soft. There is no mass.     Tenderness: There is no abdominal tenderness.     Comments: Right anterior abdominal wall bulging, worse with Valsalva and standing position.  Musculoskeletal:        General: No deformity. Normal range of motion.     Cervical back: Normal range of motion and neck supple.     Comments: Left knee status post knee replacement.  Some residual swelling  Skin:    General: Skin is warm and dry.     Findings: No erythema or rash.  Neurological:     Mental Status: He is alert and oriented to person, place, and time. Mental status is at baseline.     Cranial Nerves: No cranial nerve deficit.     Coordination: Coordination normal.  Psychiatric:        Mood and Affect: Mood normal.     LABORATORY DATA:  I have reviewed the data as listed Lab Results  Component Value Date   WBC 7.3 10/05/2020   HGB 14.3 10/05/2020   HCT 43.2 10/05/2020   MCV 88.2 10/05/2020   PLT 255 10/05/2020   Recent Labs    12/25/19 1031 12/25/19 1125 01/05/20 0840 01/12/20 1105 01/26/20 0829 02/12/20 1051 02/19/20 1115 02/27/20 1055 03/16/20 0830 08/24/20 0831 09/14/20 0838 10/05/20 0836  NA 134*  --  134*  --  135  --   --   --    < > 135 135 136  K 4.7  --  4.4  --  4.6  --   --   --    < > 4.9 4.3 4.5  CL 104  --  101  --  102  --   --   --    < > 100 98 98  CO2 24  --  27  --  24  --   --   --    < > 25 25 26   GLUCOSE 98  --  94  --  96  --   --   --    < > 151* 155* 148*  BUN 24*  --  26*  --  23*  --   --   --    < > 24* 21* 24*  CREATININE 1.04  --  1.20  --  1.10  --   --   --    < > 1.01 0.96 1.10  CALCIUM 8.8*  --  9.1  --  9.2  --   --   --    < > 10.0 9.7 9.7  GFRNONAA >60  --  >60  --  >60  --   --   --    < > >60 >60 >60  GFRAA >60  --  >60  --  >60  --   --   --   --   --   --   --   PROT 6.8   < > 6.9   < > 6.6 6.7 6.8 6.9   < > 7.9 8.1  7.6  ALBUMIN 4.0   < > 4.2   < > 3.9 4.1 4.0 4.2   < > 4.3 4.1 4.2  AST 24   < > 23   < > 183* 130* 99* 67*   < > 24 21 20   ALT 22   < > 21   < > 336* 266* 203* 127*   < > 22 20 21   ALKPHOS 46   < > 50   < > 60 66 64 57   < > 95 90 76  BILITOT 0.8   < > 0.7   < > 0.9 1.2 1.1 1.0   < > 0.6 0.6 0.8  BILIDIR  --    < >  --    < >  --  0.3* 0.2 0.2  --   --   --   --   IBILI  --    < >  --    < >  --  0.9 0.9 0.8  --   --   --   --    < > = values in this interval not displayed.   Iron/TIBC/Ferritin/ %Sat    Component Value Date/Time   IRON 118 09/25/2019 1115   TIBC 454 (H) 09/25/2019 1115   FERRITIN 236 09/25/2019 1115   IRONPCTSAT 26 09/25/2019 1115      RADIOGRAPHIC STUDIES: I have personally reviewed the radiological images as listed and agreed with the findings in the report.  CT CHEST ABDOMEN PELVIS WO CONTRAST  Result Date: 09/30/2020 CLINICAL DATA:  Restaging metastatic renal cell carcinoma. EXAM: CT CHEST, ABDOMEN AND PELVIS WITHOUT CONTRAST TECHNIQUE: Multidetector CT imaging of the chest, abdomen and pelvis was performed following the standard protocol without IV contrast. COMPARISON:  Multiple priors including CT chest abdomen pelvis May 11, 2020 FINDINGS: CT CHEST FINDINGS Cardiovascular: Aortic atherosclerosis without aneurysmal dilation. Coronary artery calcifications. Normal size heart. No significant pericardial effusion/thickening. Mediastinum/Nodes: Thyroid unremarkable. No pathologically enlarged mediastinal, hilar or axillary lymph nodes visualized within the limitations of noncontrast examination. Trachea is unremarkable. Lungs/Pleura: No significant change in size of the scattered small pulmonary nodules. Index nodule within the posterolateral left upper lobe on image 46/3 measures 4 mm, unchanged. No new suspicious pulmonary nodule. Scattered areas of subsegmental atelectasis in the left lung base lingula and right middle lobe appears similar prior. No pleural  effusion. No pneumothorax. Musculoskeletal: No chest wall mass or suspicious bone lesions identified. CT ABDOMEN PELVIS FINDINGS Hepatobiliary: Hepatic parenchyma is grossly unremarkable in noncontrast appearance. Gallbladder is grossly unremarkable. No biliary ductal dilation. Pancreas: Unremarkable. No pancreatic ductal dilatation  or surrounding inflammatory changes. Spleen: Normal in size without focal abnormality. Adrenals/Urinary Tract: Bilateral adrenal glands unremarkable. Prior right nephrectomy without new suspicious soft tissue nodularity in the nephrectomy bed, to suggest local recurrence. Left kidney demonstrates no hydronephrosis or renal stones. Hypodense 1.2 cm cortical lesion in the interpolar region of the left kidney on image 72/2, consistent with a cyst. Stomach/Bowel: Stomach is grossly unremarkable. Enteric contrast traverses the terminal ileum. No pathologic dilation of small bowel. Appendix is grossly unremarkable. No suspicious colonic wall thickening or masslike lesions. Vascular/Lymphatic: Aortic atherosclerosis without aneurysmal dilation. No pathologically enlarged abdominal or pelvic lymph nodes. Reproductive: Prostate is unremarkable. Other: No abdominopelvic ascites. Musculoskeletal: No acute or significant osseous findings. IMPRESSION: 1. Stable examination without evidence of local recurrence or new/progressive metastatic disease in the chest abdomen or pelvis. 2. Tiny nonspecific pulmonary nodules are unchanged from prior examination. No new suspicious pulmonary nodule. 3. Aortic atherosclerosis. Aortic Atherosclerosis (ICD10-I70.0). Electronically Signed   By: Dahlia Bailiff MD   On: 09/30/2020 19:14      ASSESSMENT & PLAN:  1. Clear cell carcinoma of right kidney ([place])   2. 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 Labs reviewed and discussed with patient 09/29/2020 CT scan showed no  evidence of local recurrence or new/progressive disease in the chest abdomen or pelvis.  Tiny nonspecific lung nodules are unchanged.  No new lung nodules. Proceed with Keytruda maintenance.  Recent history of knee replacement.  Follow-up with orthopedic surgeon.  Recommend patient to continue aspirin 81 mg for DVT prophylaxis.  Follow-up in 3 weeks We spent sufficient time to discuss many aspect of care, questions were answered to patient's satisfaction. All questions were answered. The patient knows to call the clinic with any problems questions or concerns.  Earlie Server, MD, PhD Hematology Oncology [facility] at [facility] Pager- [phone] 10/05/2020

## 2020-10-08 ENCOUNTER — Encounter: Payer: Self-pay | Admitting: Oncology

## 2020-10-13 ENCOUNTER — Ambulatory Visit: Payer: BC Managed Care – PPO | Admitting: Surgery

## 2020-10-13 IMAGING — CR DG CHEST 2V
1 series · 2 of 2 positions shown · non-contrast
Comparison: 05/21/2005

CLINICAL DATA: Right renal mass. Preop. Hypertension, diabetes,
prior smoker.

EXAM:
CHEST - 2 VIEW

[Series 1: w chest pa · 0.14mm/px · 2 of 2 slices shown]
[im 1/2]
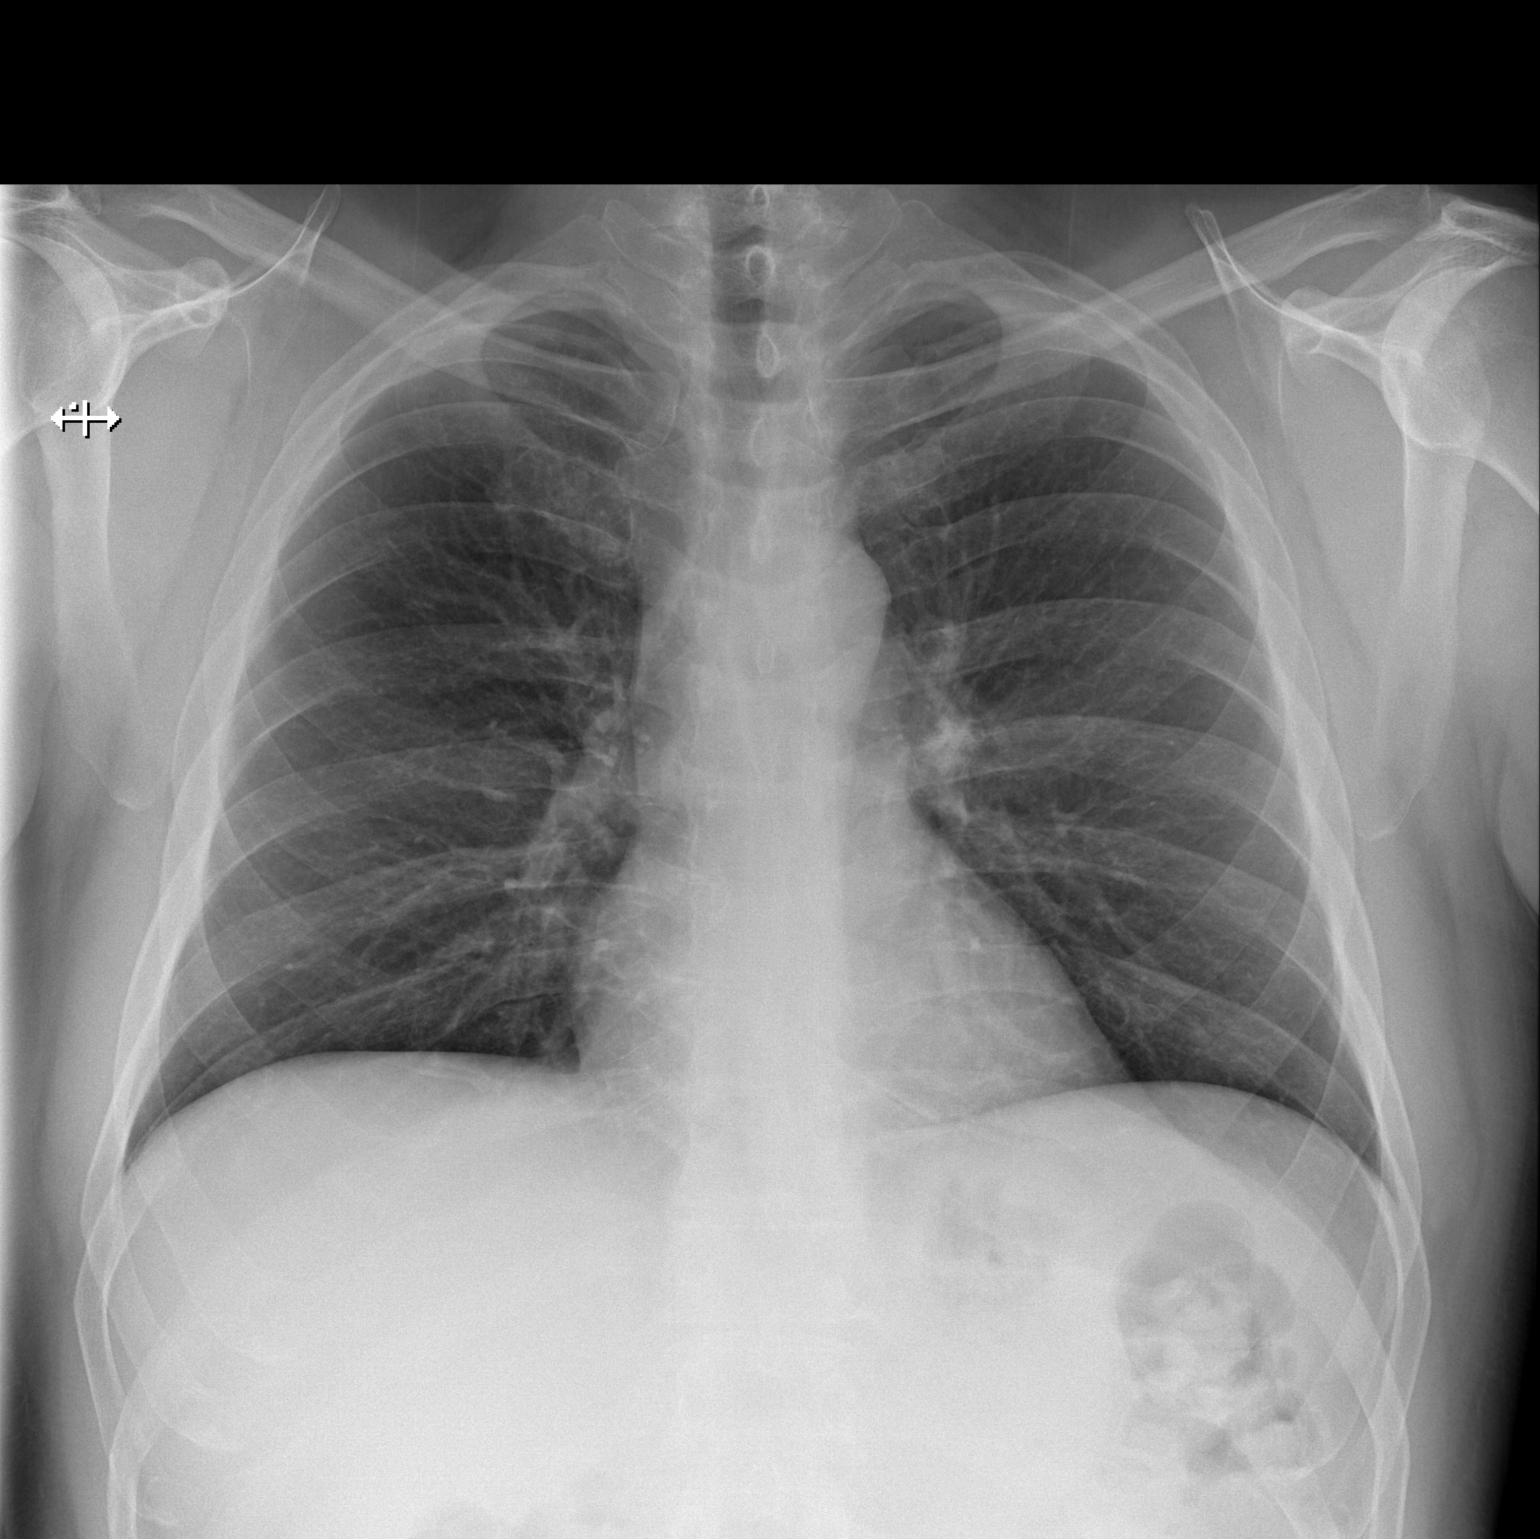
[im 2/2]
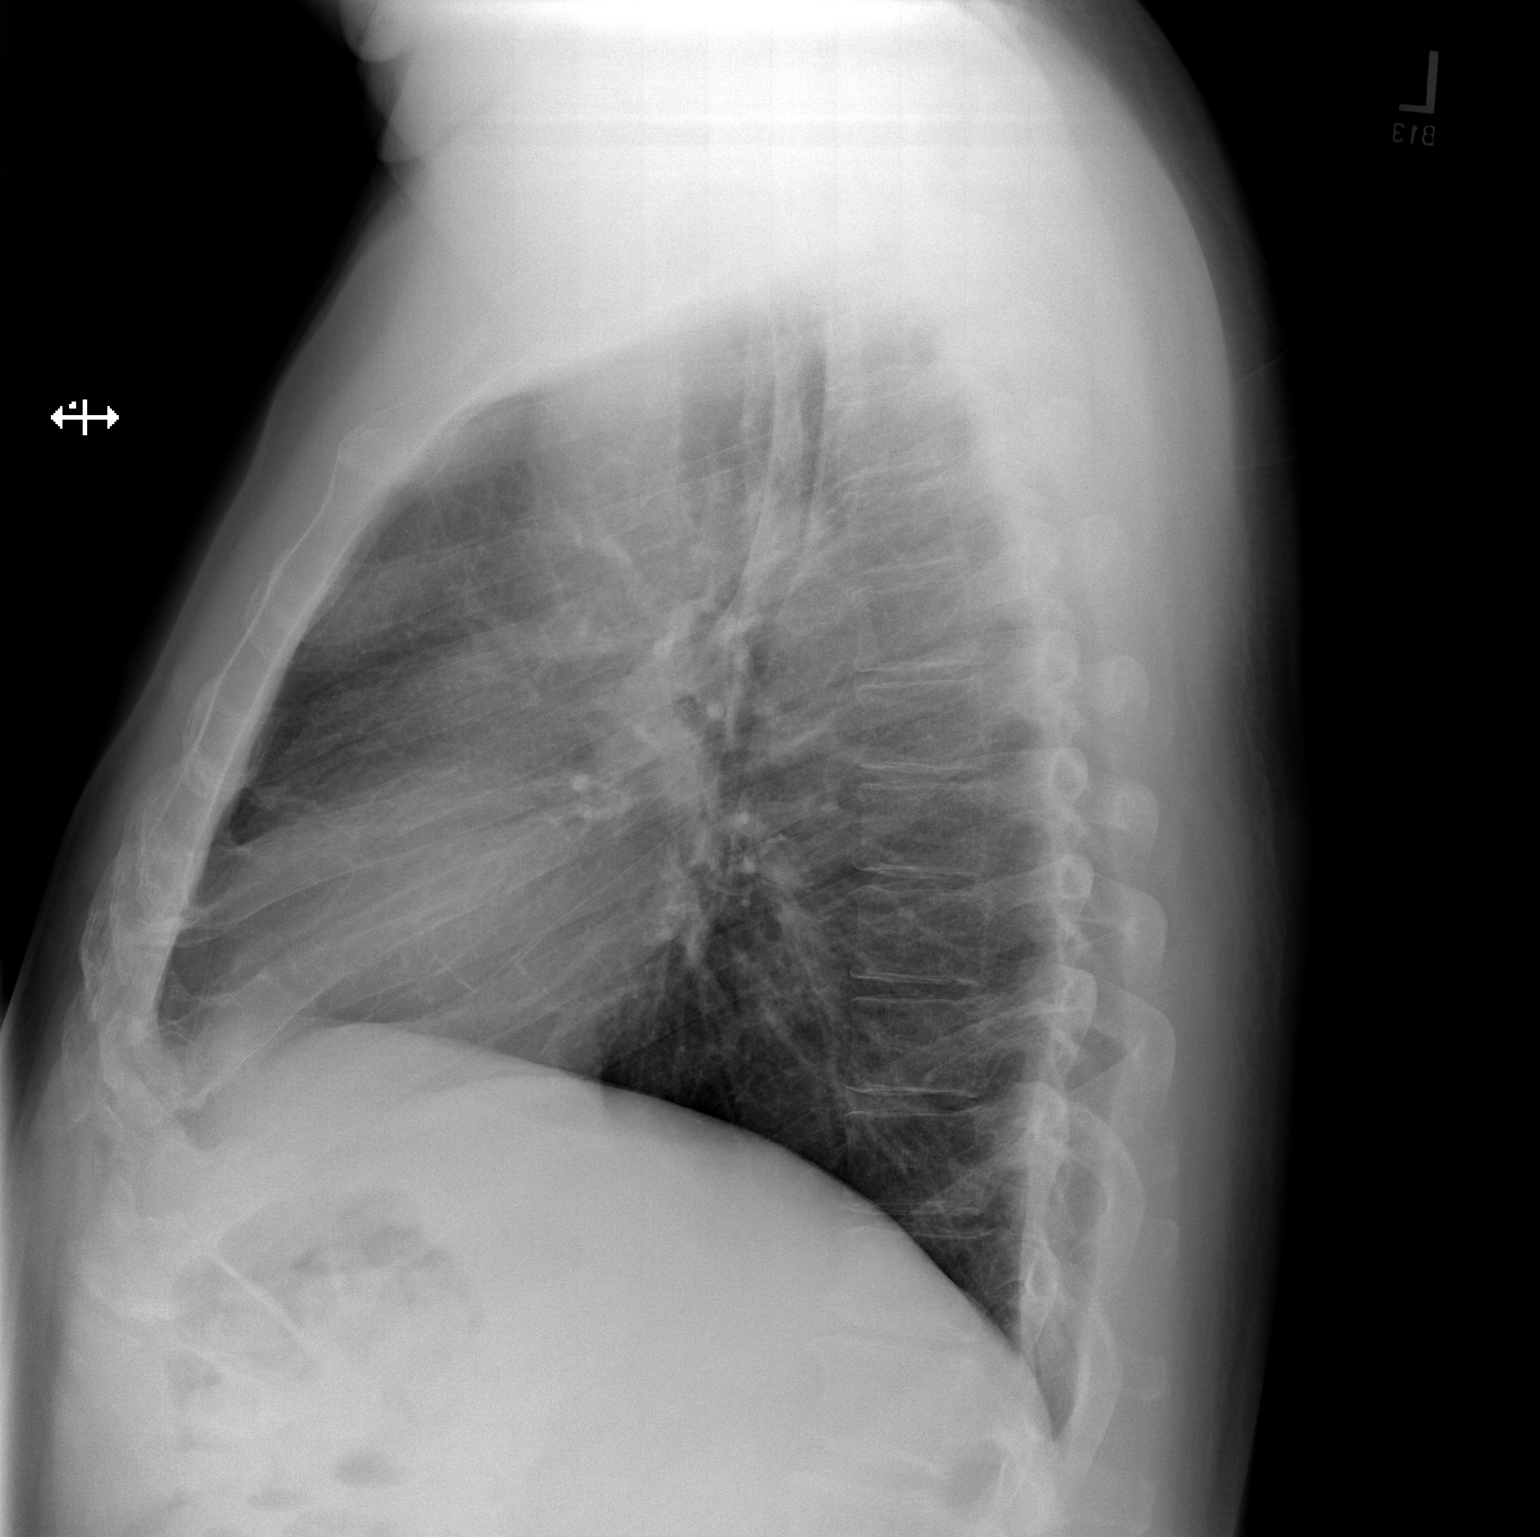

[2 of 2 positions shown; findings below may reference images not displayed]

FINDINGS: Heart and mediastinal contours are within normal limits. No focal
opacities or effusions. No acute bony abnormality.
IMPRESSION: No active cardiopulmonary disease.

## 2020-10-13 IMAGING — CT CT ABD-PEL WO/W CM
3 of 12 series · 11 of 46 positions shown, 17 images · IV contrast (iopamidol)
Comparison: None.

CLINICAL DATA: Hematuria.

EXAM:
CT ABDOMEN AND PELVIS WITHOUT AND WITH CONTRAST
TECHNIQUE: Multidetector CT imaging of the abdomen and pelvis was performed
following the standard protocol before and following the bolus
administration of intravenous contrast.
CONTRAST:  125mL U24BH2-PBB IOPAMIDOL (U24BH2-PBB) INJECTION 61%

[Series 2: without pre · axial · non-contrast · 0.83mm/px · z∈[-1604,-1214]mm · 7 of 105 slices shown, 12 images]
[im 14/105  soft-tissue]
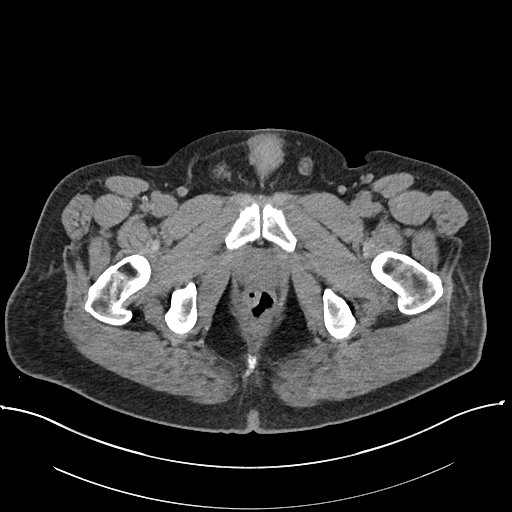
[im 14/105  bone]
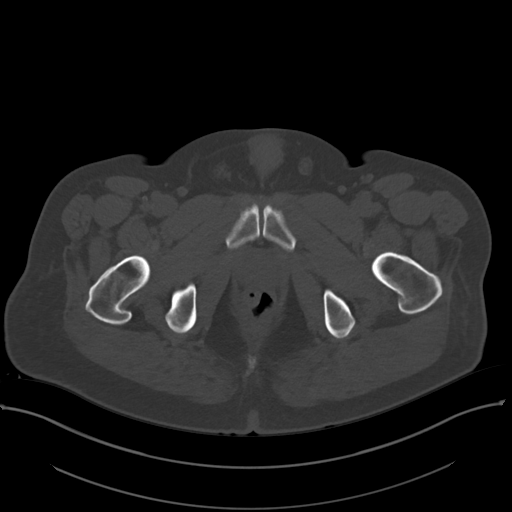
[im 27/105  soft-tissue]
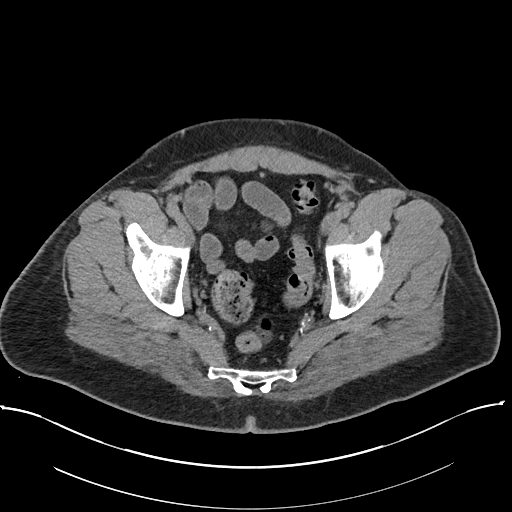
[im 40/105  soft-tissue]
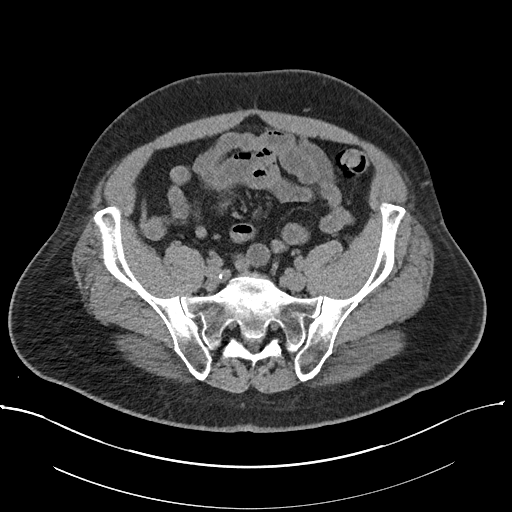
[im 53/105  soft-tissue]
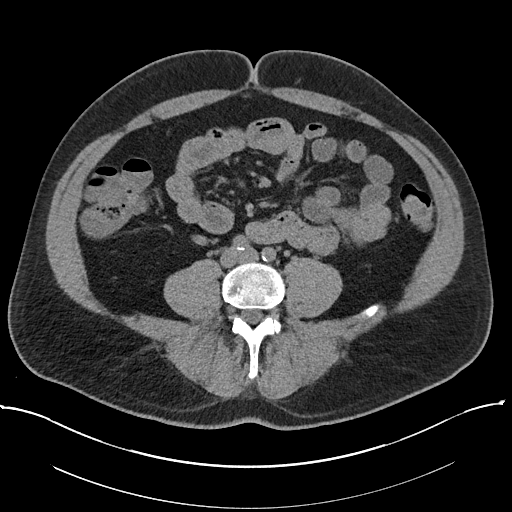
[im 53/105  lung]
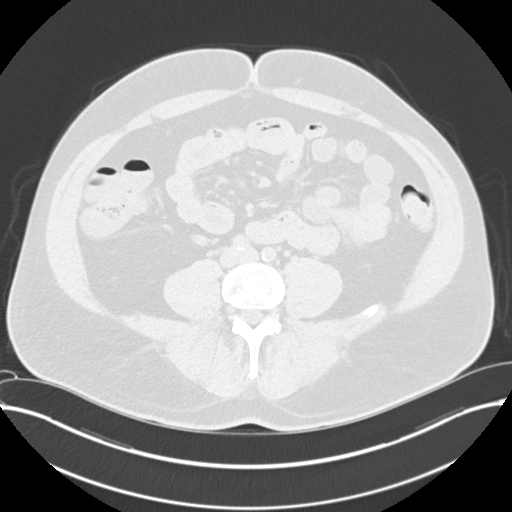
[im 66/105  soft-tissue]
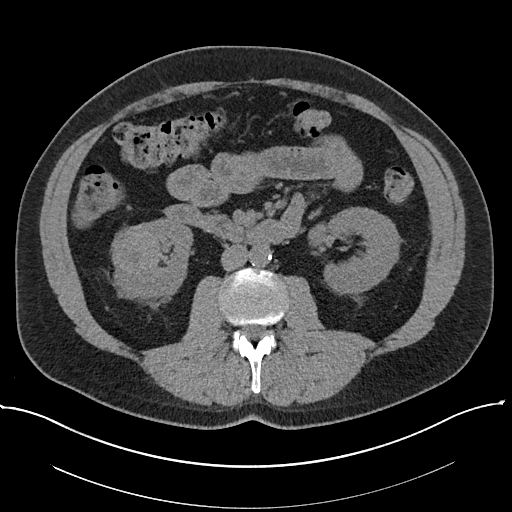
[im 66/105  lung]
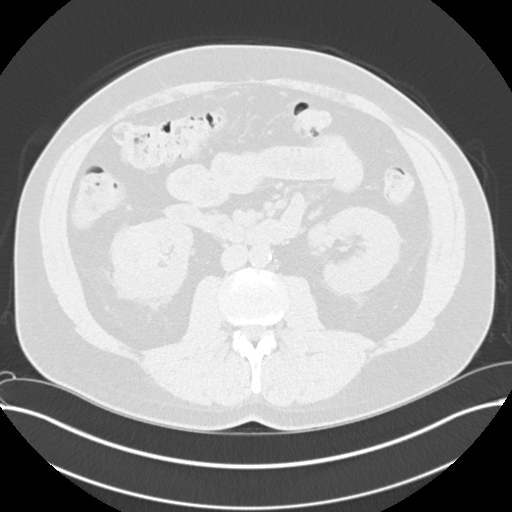
[im 79/105  soft-tissue]
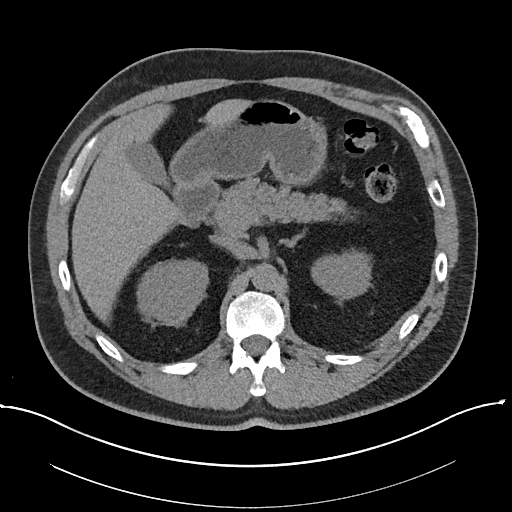
[im 79/105  lung]
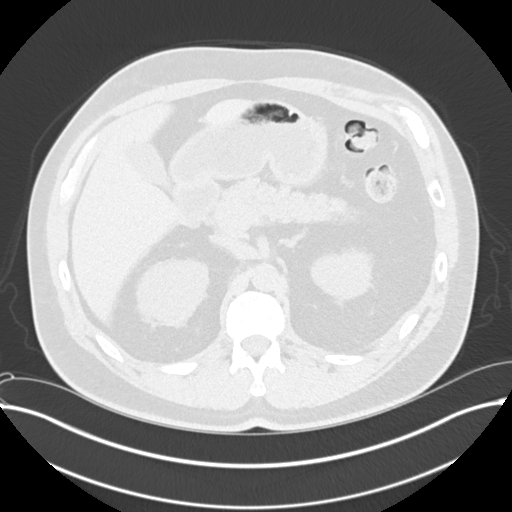
[im 92/105  soft-tissue]
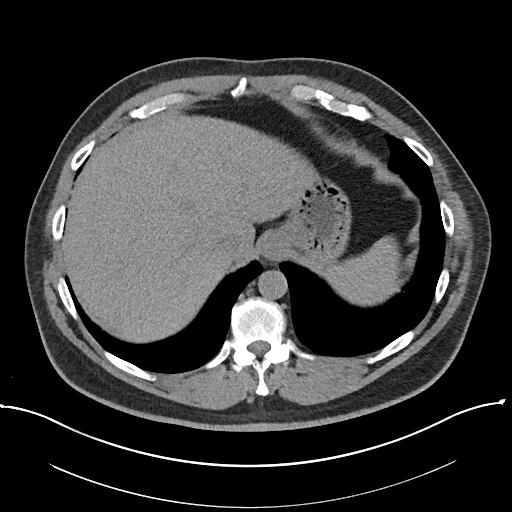
[im 92/105  lung]
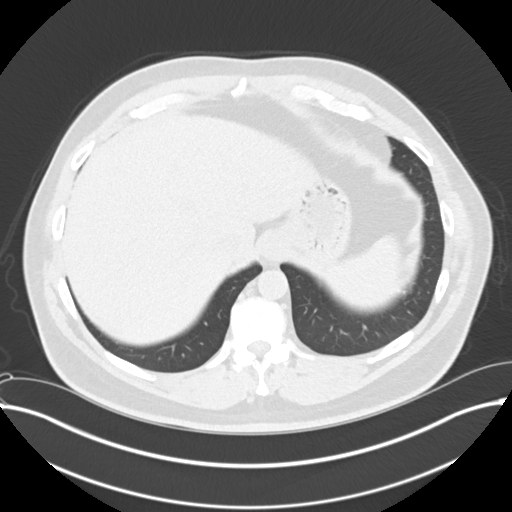

[Series 5: cor without without pre · coronal · non-contrast · 0.83mm/px · 2 of 171 slices shown, 3 images]
[im 57/171  soft-tissue]
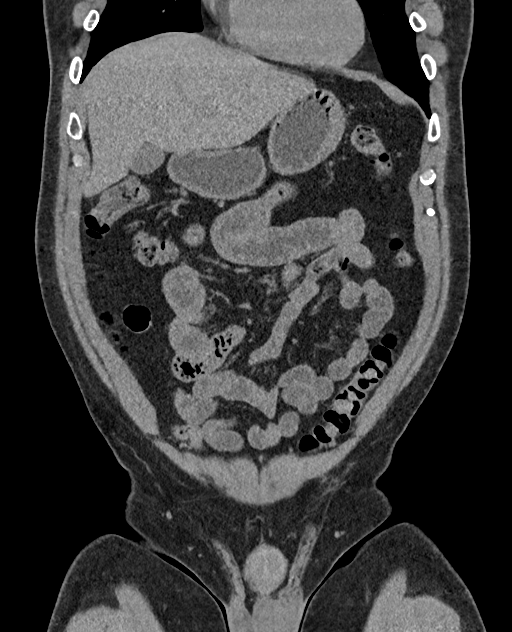
[im 57/171  bone]
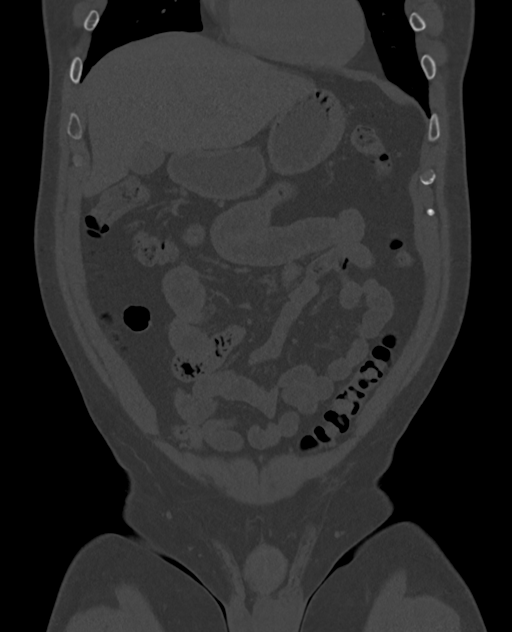
[im 114/171  soft-tissue]
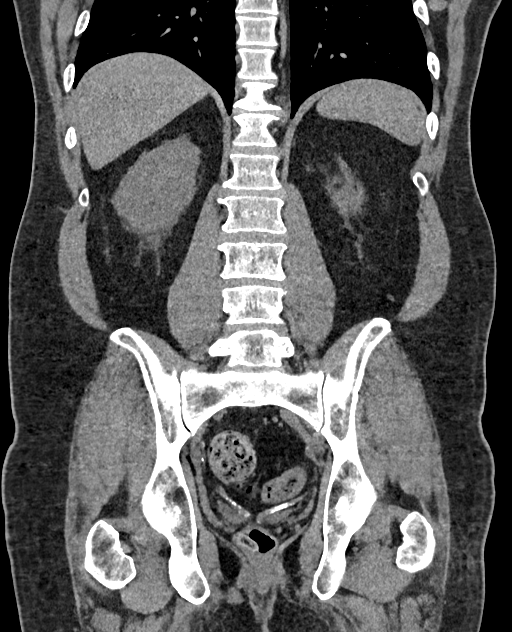

[Series 9: axial with hematuria with · axial · 0.83mm/px · z∈[-1599,-1524]mm · 2 of 105 slices shown]
[im 15/105  soft-tissue]
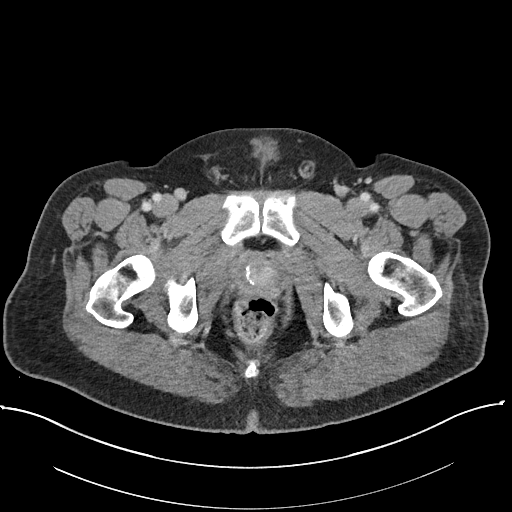
[im 30/105  soft-tissue]
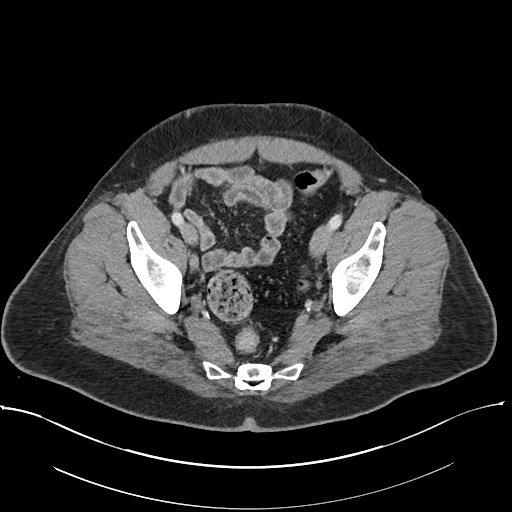

[11 of 46 positions shown; findings below may reference images not displayed]

FINDINGS: Lower chest: No acute abnormality.

Hepatobiliary: No focal liver abnormality is seen. No gallstones,
gallbladder wall thickening, or biliary dilatation.

Pancreas: Unremarkable. No pancreatic ductal dilatation or
surrounding inflammatory changes.

Spleen: Normal in size without focal abnormality.

Adrenals/Urinary Tract: Normal appearance of the adrenal glands.
Punctate stone within the lower pole of left kidney measures 2 mm,
image 44/2. Within the anterolateral cortex of the inferior pole of
right kidney there is a solid enhancing mass measuring 4.2 by 4.0 by
4.3 cm, image 42/17. Extension into the inferior pole sinus with
involvement of the lower pole calices identified. The right renal
vein and IVC appear patent. Arising from the posterior cortex of the
upper pole of left kidney is a hyperdense lesion measuring 2.0 by
2.0 by 1.8 cm. Precontrast Hounsfield units equal 64.86.
Postcontrast Hounsfield units equal 70. Adjacent cortical based
hypodense structure within the right kidney measures 1.0 by 1.5 by
1.1 cm. No definitive enhancement identified within this structure.

There is no hydronephrosis identified bilaterally. No hydroureter.
Urinary bladder appears normal.

Stomach/Bowel: Stomach is normal. The small bowel loops have a
normal course and caliber. The appendix is visualized and appears
normal. Distal colonic diverticulosis noted without acute
inflammation.

Vascular/Lymphatic: Aortic atherosclerosis. No abdominal adenopathy.
No pelvic or inguinal adenopathy.

Reproductive: Nodular appearing prostate gland with mass effect upon
the bladder base noted. This measures 4.4 x 4.3 by 4.6 cm (volume =
46 cm^3).

Other: No free fluid or fluid collections identified.

Musculoskeletal: No acute or significant osseous findings.
IMPRESSION: 1. Solid enhancing mass involving the inferior pole of right kidney
measures 4.3 cm in maximum dimension. Findings consistent with renal
cell carcinoma. There is extension into the inferior pole sinus with
involvement of the lower pole calices of the right kidney. No
evidence for renal vein invasion or abdominal adenopathy. No
evidence for distant metastasis within the abdomen or pelvis.
2. Bosniak category 1 and 2 cysts noted within the upper pole of
left kidney.
3.  Aortic Atherosclerosis ([id number]).
4. Prostate gland enlargement.
5. Tiny punctate stone noted within the inferior pole collecting
system of left kidney.
6. These results will be called to the ordering clinician or
representative by the Radiologist Assistant, and communication
documented in the PACS or zVision Dashboard.

## 2020-10-14 ENCOUNTER — Encounter: Payer: Self-pay | Admitting: Oncology

## 2020-10-18 ENCOUNTER — Ambulatory Visit (INDEPENDENT_AMBULATORY_CARE_PROVIDER_SITE_OTHER): Payer: BC Managed Care – PPO | Admitting: Surgery

## 2020-10-18 ENCOUNTER — Other Ambulatory Visit: Payer: Self-pay

## 2020-10-18 ENCOUNTER — Encounter: Payer: Self-pay | Admitting: Surgery

## 2020-10-18 VITALS — BP 110/77 | HR 103 | Temp 98.7°F | Ht 72.0 in | Wt 206.4 lb

## 2020-10-18 DIAGNOSIS — K432 Incisional hernia without obstruction or gangrene: Secondary | ICD-10-CM

## 2020-10-18 NOTE — Patient Instructions (Signed)
Our surgery scheduler will call to schedule your surgery within 24-48 hours. Please have the [facility] surgery sheet available when speaking with her.   Please hold your Aspirin 3-5 days prior to your surgery.  Hernia, Adult     A hernia happens when tissue inside your body pushes out through a weak spot in your belly muscles (abdominal wall). This makes a round lump (bulge). The lump may be:  In a scar from surgery that was done in your belly (incisional hernia).  Near your belly button (umbilical hernia).  In your groin (inguinal hernia). Your groin is the area where your leg meets your lower belly (abdomen). This kind of hernia could also be: ? In your scrotum, if you are male. ? In folds of skin around your vagina, if you are male.  In your upper thigh (femoral hernia).  Inside your belly (hiatal hernia). This happens when your stomach slides above the muscle between your belly and your chest (diaphragm). If your hernia is small and it does not cause pain, you may not need treatment. If your hernia is large or it causes pain, you may need surgery. Follow these instructions at home: Activity  Avoid stretching or overusing (straining) the muscles near your hernia. Straining can happen when you: ? Lift something heavy. ? Poop (have a bowel movement).  Do not lift anything that is heavier than 10 lb (4.5 kg), or the limit that you are told, until your doctor says that it is safe.  Use the strength of your legs when you lift something heavy. Do not use only your back muscles to lift. General instructions  Do these things if told by your doctor so you do not have trouble pooping (constipation): ? Drink enough fluid to keep your pee (urine) pale yellow. ? Eat foods that are high in fiber. These include fresh fruits and vegetables, whole grains, and beans. ? Limit foods that are high in fat and processed sugars. These include foods that are fried or sweet. ? Take medicine for trouble  pooping.  When you cough, try to cough gently.  You may try to push your hernia in by very gently pressing on it when you are lying down. Do not try to force the bulge back in if it will not push in easily.  If you are overweight, work with your doctor to lose weight safely.  Do not use any products that have nicotine or tobacco in them. These include cigarettes and e-cigarettes. If you need help quitting, ask your doctor.  If you will be having surgery (hernia repair), watch your hernia for changes in shape, size, or color. Tell your doctor if you see any changes.  Take over-the-counter and prescription medicines only as told by your doctor.  Keep all follow-up visits as told by your doctor. Contact a doctor if:  You get new pain, swelling, or redness near your hernia.  You poop fewer times in a week than normal.  You have trouble pooping.  You have poop (stool) that is more dry than normal.  You have poop that is harder or larger than normal. Get help right away if:  You have a fever.  You have belly pain that gets worse.  You feel sick to your stomach (nauseous).  You throw up (vomit).  Your hernia cannot be pushed in by very gently pressing on it when you are lying down. Do not try to force the bulge back in if it will not push in  easily.  Your hernia: ? Changes in shape or size. ? Changes color. ? Feels hard or it hurts when you touch it. These symptoms may represent a serious problem that is an emergency. Do not wait to see if the symptoms will go away. Get medical help right away. Call your local emergency services (911 in the [place] Summary  A hernia happens when tissue inside your body pushes out through a weak spot in the belly muscles. This creates a bulge.  If your hernia is small and it does not hurt, you may not need treatment. If your hernia is large or it hurts, you may need surgery.  If you will be having surgery, watch your hernia for changes in  shape, size, or color. Tell your doctor about any changes. This information is not intended to replace advice given to you by your health care provider. Make sure you discuss any questions you have with your health care provider. Document Revised: 08/22/2018 Document Reviewed: 01/31/2017 Elsevier Patient Education  [employer].

## 2020-10-18 NOTE — Progress Notes (Signed)
Outpatient Surgical Follow Up  10/18/2020  Matthew Woodard is an 55 y.o. male.   Chief Complaint  Patient presents with  . Follow-up    Abdominal wall hernia    HPI: Matthew Woodard is a 55 y.o. male following for right incisional hernia.  He did have a history of renal cell carcinoma and is status post hand-assisted right nephrectomy performed Dr. Diamantina [place] 2020.  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  He has been started on Keytruda which she has tolerated well.  He does have significant bilateral knee pain and is scheduled to have elective knee replacement on the left side within a few weeks. He reports a bulge in the right lower quadrant that has been present for the last year or so.  Some discomfort and the bulge seems to be larger when lifting heavy objects.  He denies any fevers any chills no evidence of incarceration or strangulation. He is able to perform more than 4 METS of activity without any shortness of breath or chest pain.  He did have a CT of the chest abdomen pelvis that I personally reviewed showing evidence of bilateral pulmonary nodules and evidence of right lower quadrant incisional hernia. He also had a left knee replacement and did very well.  Now he is doing physical therapy and he is walking.  He is ready to have his hernia fixed     Past Medical History:  Diagnosis Date  . Arthritis    hands, left knee  . Cancer ([place])   . Diabetes mellitus without complication ([place])   . Eczema 01/08/2017  . GERD (gastroesophageal reflux disease)   . History of kidney cancer   . Hyperlipidemia LDL goal <100 11/02/2014  . Hypertension   . Kidney stone    YEAR H/O HEMATURIA  . Psoriasis   . Wears dentures    full upper, partial lower.  Has, does not wear    Past Surgical History:  Procedure Laterality Date  . COLONOSCOPY WITH PROPOFOL N/A 02/23/2017   Procedure:  COLONOSCOPY WITH PROPOFOL;  Surgeon: [name], MD;  Location: [facility];  Service: Endoscopy;  Laterality: N/A;  Diabetic - oral meds  . CYST EXCISION  Aug. 2016   on back  . CYSTOSCOPY W/ RETROGRADES Right 06/28/2018   Procedure: CYSTOSCOPY WITH RETROGRADE PYELOGRAM;  Surgeon: Billey Co, MD;  Location: [facility];  Service: Urology;  Laterality: Right;  . CYSTOSCOPY W/ URETERAL STENT REMOVAL Right 07/22/2018   Procedure: CYSTOSCOPY WITH STENT REMOVAL;  Surgeon: Billey Co, MD;  Location: [facility];  Service: Urology;  Laterality: Right;  . CYSTOSCOPY WITH BIOPSY Right 06/28/2018   Procedure: CYSTOSCOPY WITH RENAL BIOPSY;  Surgeon: Billey Co, MD;  Location: [facility];  Service: Urology;  Laterality: Right;  . CYSTOSCOPY WITH URETEROSCOPY AND STENT PLACEMENT Right 06/28/2018   Procedure: CYSTOSCOPY WITH URETEROSCOPY AND STENT PLACEMENT;  Surgeon: Billey Co, MD;  Location: [facility];  Service: Urology;  Laterality: Right;  . FULGURATION OF BLADDER TUMOR N/A 06/28/2018   Procedure: CYSTOSCOPY BLADDER BIOPSY, FULGERATION OF BLADDER;  Surgeon: Billey Co, MD;  Location: [facility];  Service: Urology;  Laterality: N/A;  . KNEE SURGERY Left   . LAPAROSCOPIC NEPHRECTOMY, HAND ASSISTED Right 07/22/2018   Procedure: HAND ASSISTED LAPAROSCOPIC NEPHRECTOMY;  Surgeon: Billey Co, MD;  Location: [facility];  Service: Urology;  Laterality: Right;  .  NASAL SINUS SURGERY  03/15  . POLYPECTOMY N/A 02/23/2017   Procedure: POLYPECTOMY;  Surgeon: [name], MD;  Location: [facility];  Service: Endoscopy;  Laterality: N/A;  . tooth abstraction    . TRANSURETHRAL RESECTION OF BLADDER TUMOR N/A 05/12/2019   Procedure: TRANSURETHRAL RESECTION OF BLADDER TUMOR (TURBT);  Surgeon: Billey Co, MD;  Location: [facility];  Service: Urology;  Laterality: N/A;  . VARICOCELE EXCISION      Family History  Problem Relation Age of Onset  . Heart disease Father   .  Hypertension Father   . COPD Father   . Colon cancer Maternal Grandmother   . Heart attack Paternal Grandfather     Social History:  reports that he quit smoking about 7 years ago. His smoking use included cigarettes. He has never used smokeless tobacco. He reports that he does not drink alcohol and does not use drugs.  Allergies:  Allergies  Allergen Reactions  . Glucotrol [Glipizide] Other (See Comments)    Glucose level spiked then dropped    Medications reviewed.    ROS Full ROS performed and is otherwise negative other than what is stated in HPI   BP 110/77   Pulse (!) 103   Temp 98.7 F (37.1 C) (Oral)   Ht 6' (1.829 m)   Wt 206 lb 6.4 oz (93.6 kg)   SpO2 97%   BMI 27.99 kg/m   Physical Exam Vitals and nursing note reviewed. Exam conducted with a chaperone present.  Constitutional:      General: He is not in acute distress.    Appearance: Normal appearance. He is normal weight. He is not toxic-appearing.  Eyes:     General: No scleral icterus.       Right eye: No discharge.        Left eye: No discharge.  Cardiovascular:     Rate and Rhythm: Normal rate and regular rhythm.     Heart sounds: No murmur heard.   Pulmonary:     Effort: Pulmonary effort is normal. No respiratory distress.     Breath sounds: Normal breath sounds. No stridor. No wheezing or rhonchi.  Abdominal:     General: Abdomen is flat. There is no distension.     Palpations: Abdomen is soft. There is no mass.     Tenderness: There is no abdominal tenderness. There is no guarding or rebound.     Hernia: A hernia is present.     Comments: 6 cm incisional hernia located in the right lower quadrant.  Reducible.  No peritonitis  Musculoskeletal:        General: No swelling. Normal range of motion.     Cervical back: Normal range of motion and neck supple.  Skin:    General: Skin is warm and dry.     Capillary Refill: Capillary refill takes less than 2 seconds.  Neurological:     General:  No focal deficit present.     Mental Status: He is alert and oriented to person, place, and time.  Psychiatric:        Mood and Affect: Mood normal.        Behavior: Behavior normal.        Thought Content: Thought content normal.        Judgment: Judgment normal.        Assessment/Plan: 55 year old male with mildly symptomatic incisional hernia.  Definitely recommend repair. I do think that he will be a good candidate for robotic approach.  Procedure discussed with patient in detail.  Risks, benefits and possible complications including but not limited to: Bleeding, infection, recurrence, chronic pain, bowel injuries.  He understands and wished to proceed Greater than 50% of the 42 minutes  visit was spent in counseling/coordination of care   Caroleen Hamman, MD [facility] Surgeon

## 2020-10-19 ENCOUNTER — Telehealth: Payer: Self-pay | Admitting: Surgery

## 2020-10-19 NOTE — Telephone Encounter (Signed)
Patient has been advised of Pre-Admission date/time, COVID Testing date and Surgery date.  Surgery Date: 11/18/20 Preadmission Testing Date: 11/11/20 (phone 8a-1p) Covid Testing Date: Not needed.    Patient has been made aware to call [phone], between 1-3:00pm the day before surgery, to find out what time to arrive for surgery.

## 2020-10-26 ENCOUNTER — Encounter: Payer: Self-pay | Admitting: Oncology

## 2020-10-26 ENCOUNTER — Inpatient Hospital Stay: Payer: BC Managed Care – PPO | Attending: Oncology

## 2020-10-26 ENCOUNTER — Inpatient Hospital Stay (HOSPITAL_BASED_OUTPATIENT_CLINIC_OR_DEPARTMENT_OTHER): Payer: BC Managed Care – PPO | Admitting: Oncology

## 2020-10-26 ENCOUNTER — Inpatient Hospital Stay: Payer: BC Managed Care – PPO

## 2020-10-26 VITALS — BP 128/87 | HR 88 | Temp 96.2°F | Resp 16 | Wt 209.8 lb

## 2020-10-26 DIAGNOSIS — E119 Type 2 diabetes mellitus without complications: Secondary | ICD-10-CM | POA: Diagnosis not present

## 2020-10-26 DIAGNOSIS — E785 Hyperlipidemia, unspecified: Secondary | ICD-10-CM | POA: Diagnosis not present

## 2020-10-26 DIAGNOSIS — Z905 Acquired absence of kidney: Secondary | ICD-10-CM | POA: Diagnosis not present

## 2020-10-26 DIAGNOSIS — Z5112 Encounter for antineoplastic immunotherapy: Secondary | ICD-10-CM

## 2020-10-26 DIAGNOSIS — Z87891 Personal history of nicotine dependence: Secondary | ICD-10-CM | POA: Insufficient documentation

## 2020-10-26 DIAGNOSIS — C7911 Secondary malignant neoplasm of bladder: Secondary | ICD-10-CM | POA: Diagnosis not present

## 2020-10-26 DIAGNOSIS — K439 Ventral hernia without obstruction or gangrene: Secondary | ICD-10-CM | POA: Diagnosis not present

## 2020-10-26 DIAGNOSIS — C641 Malignant neoplasm of right kidney, except renal pelvis: Secondary | ICD-10-CM

## 2020-10-26 DIAGNOSIS — Z79899 Other long term (current) drug therapy: Secondary | ICD-10-CM | POA: Diagnosis not present

## 2020-10-26 DIAGNOSIS — Z96652 Presence of left artificial knee joint: Secondary | ICD-10-CM | POA: Diagnosis not present

## 2020-10-26 DIAGNOSIS — I10 Essential (primary) hypertension: Secondary | ICD-10-CM | POA: Insufficient documentation

## 2020-10-26 DIAGNOSIS — Z7984 Long term (current) use of oral hypoglycemic drugs: Secondary | ICD-10-CM | POA: Insufficient documentation

## 2020-10-26 LAB — CBC WITH DIFFERENTIAL/PLATELET
Abs Immature Granulocytes: 0.05 10*3/uL (ref 0.00–0.07)
Basophils Absolute: 0.1 10*3/uL (ref 0.0–0.1)
Basophils Relative: 1 %
Eosinophils Absolute: 0.6 10*3/uL — ABNORMAL HIGH (ref 0.0–0.5)
Eosinophils Relative: 7 %
HCT: 41.4 % (ref 39.0–52.0)
Hemoglobin: 13.6 g/dL (ref 13.0–17.0)
Immature Granulocytes: 1 %
Lymphocytes Relative: 32 %
Lymphs Abs: 2.5 10*3/uL (ref 0.7–4.0)
MCH: 29.2 pg (ref 26.0–34.0)
MCHC: 32.9 g/dL (ref 30.0–36.0)
MCV: 88.8 fL (ref 80.0–100.0)
Monocytes Absolute: 0.6 10*3/uL (ref 0.1–1.0)
Monocytes Relative: 8 %
Neutro Abs: 4.1 10*3/uL (ref 1.7–7.7)
Neutrophils Relative %: 51 %
Platelets: 228 10*3/uL (ref 150–400)
RBC: 4.66 MIL/uL (ref 4.22–5.81)
RDW: 13 % (ref 11.5–15.5)
WBC: 7.8 10*3/uL (ref 4.0–10.5)
nRBC: 0 % (ref 0.0–0.2)

## 2020-10-26 LAB — COMPREHENSIVE METABOLIC PANEL
ALT: 22 U/L (ref 0–44)
AST: 22 U/L (ref 15–41)
Albumin: 4.1 g/dL (ref 3.5–5.0)
Alkaline Phosphatase: 73 U/L (ref 38–126)
Anion gap: 8 (ref 5–15)
BUN: 24 mg/dL — ABNORMAL HIGH (ref 6–20)
CO2: 28 mmol/L (ref 22–32)
Calcium: 9.5 mg/dL (ref 8.9–10.3)
Chloride: 100 mmol/L (ref 98–111)
Creatinine, Ser: 0.95 mg/dL (ref 0.61–1.24)
GFR, Estimated: >60 mL/min (ref >60)
Glucose, Bld: 135 mg/dL — ABNORMAL HIGH (ref 70–99)
Potassium: 4.4 mmol/L (ref 3.5–5.1)
Sodium: 136 mmol/L (ref 135–145)
Total Bilirubin: 0.5 mg/dL (ref 0.3–1.2)
Total Protein: 7.3 g/dL (ref 6.5–8.1)

## 2020-10-26 MED ORDER — SODIUM CHLORIDE 0.9 % IV SOLN
200.0000 mg | Freq: Once | INTRAVENOUS | Status: AC
  Administered 2020-10-26: 200 mg via INTRAVENOUS
  Filled 2020-10-26: qty 8

## 2020-10-26 MED ORDER — SODIUM CHLORIDE 0.9 % IV SOLN
Freq: Once | INTRAVENOUS | Status: AC
  Filled 2020-10-26: qty 250

## 2020-10-26 NOTE — Progress Notes (Signed)
Patient denies new problems/concerns today.   °

## 2020-10-26 NOTE — Progress Notes (Signed)
Hematology/Oncology follow up note [facility] Telephone:([phone]) [phone] Fax:(336) [phone]   Patient Care Team: Danae Orleans, MD as PCP - General (Internal Medicine) [name], [name], MD as PCP - Family Medicine (Family Medicine) [name], MD as Consulting Physician (General Surgery) [name], [name], MD as Attending Physician (Family Medicine) [name], [name], MD as Consulting Physician (Dermatology)  REFERRING PROVIDER: Danae Orleans, MD  CHIEF COMPLAINTS/REASON FOR VISIT:  Follow-up of kidney cancer  HISTORY OF PRESENTING ILLNESS:   Matthew Woodard is a  55 y.o.  male with PMH listed below was seen in consultation at the request of  Danae Orleans, MD  for evaluation of kidney cancer Patient's cancer history dated back to February 2020 when he developed gross hematuria with small clots and right-sided flank pain.  Patient had a CT urogram done which showed right 4 cm central enhancing renal mass with invasion into the collecting system.  X-ray of chest was performed to see complete staging which showed no concerning findings. 06/28/2018 patient underwent a cystoscopy, bladder biopsy and a right retrograde pyelogram with intraoperative interpretation, right diagnostic ureteroscopy, right renal pelvis biopsy, right ureteral stent placement.  biopsy showed atypical cell clusters, with extensive crush artifact, nondiagnostic.   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  Surgical margins negative for tumor.pT3a,Nx Patient has been on surveillance after surgery. 10/28/2018 CT abdomen pelvis with contrast showed minimal fluid and or postoperative changes within the right renal fossa.  No evidence of metastatic disease or recurrence.  Patient has left kidney lesion previously  characterized as nonenhancing cyst by multi phasic contrast-enhanced CT. Patient reports an episode of gross hematuria in early December 2020, patient is due for 54-month surveillance CT scan. 04/30/2019 CT abdomen pelvis with contrast showed high attenuation mass at the right uretero vesicle junction, with extension into the bladder.  Bilateral pulmonary nodules, most indicated for metastatic disease.   Patient underwent cystoscopy and transurethral resection of irregular bladder tumor 3 cm.  Mass appeared to emanate from the right ureteral orifice.  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 Patient was referred to me for further work-up and management.  #Staging chest CT with contrast showed numerous bilateral pulmonary nodules.  Compatible with metastatic disease. Bone scan showed no osseous metastatic disease.   #History of tobacco abuse, former smoker.  Quit in 2015.  He denies any hemoptysis, chest pain, shortness of breath today. Patient reports that hematuria has completely resolved.  Denies any pain today. He is accompanied by his wife.  #History of cutaneous psoriasis, mostly on his right hand, currently on weekly methotrexate 12.5 mg with good symptom control. #NGS: [facility] PD L1 TPS 1%  # patient was referred to ENT for evaluation of headache and sinus pressure.  He had a ENT evaluation and feels that patient does not have sinusitis.  [name] recommended MRI brain to rule out PRES syndrome.  Axitinib has been held MRI brain was done and was negative.   #09/2019 off axitinib and Keytruda since the beginning of May due to transaminitis. Patient was seen and evaluated by gastroenterology [name]. Work-up was negative. Ultrasound liver vascular Doppler is negative. Recommend observation. #11/24/2019, resumed on Keytruda every 3 weeks #01/05/2020, axitinib was resumed 3 mg #01/20/2020, axitinib was discontinued due to recurrence of  transaminitis  Beryle Flock was temporarily held. #  03/16/2020, resumed on Keytruda every 3 weeks. 09/29/2020 CT scan showed no evidence of local recurrence or new/progressive disease in the chest abdomen or pelvis.  Tiny nonspecific lung nodules are unchanged.  No new lung nodules.  INTERVAL HISTORY Matthew Woodard is a 55 y.o. male who has above history reviewed by me today presents for follow up visit for management of metastatic RCC. Problems and complaints are listed below:  Status post left knee replacement.  Knee pain has improved.  Voltaren cream has helped the swelling and the pain.   No new complaints.  He is accompanied by wife today. Stepfather passed away unexpectedly this past weekend.  Review of Systems  Constitutional:  Negative for appetite change, chills, fatigue, fever and unexpected weight change.  HENT:   Negative for hearing loss and voice change.   Eyes:  Negative for eye problems and icterus.  Respiratory:  Negative for chest tightness, cough and shortness of breath.   Cardiovascular:  Negative for chest pain and leg swelling.  Gastrointestinal:  Negative for abdominal distention, abdominal pain, blood in stool and nausea.  Endocrine: Negative for hot flashes.  Genitourinary:  Negative for difficulty urinating, dysuria and frequency.   Musculoskeletal:  Positive for arthralgias.  Skin:  Negative for itching and rash.  Neurological:  Negative for extremity weakness, headaches, light-headedness and numbness.  Hematological:  Negative for adenopathy. Does not bruise/bleed easily.  Psychiatric/Behavioral:  Negative for confusion.    MEDICAL HISTORY:  Past Medical History:  Diagnosis Date   Arthritis    hands, left knee   Cancer ([place])    Diabetes mellitus without complication ([place])    Eczema 01/08/2017   GERD (gastroesophageal reflux disease)    History of kidney cancer    Hyperlipidemia LDL goal <100 11/02/2014   Hypertension    Kidney stone    YEAR H/O HEMATURIA    Psoriasis    Wears dentures    full upper, partial lower.  Has, does not wear    SURGICAL HISTORY: Past Surgical History:  Procedure Laterality Date   COLONOSCOPY WITH PROPOFOL N/A 02/23/2017   Procedure: COLONOSCOPY WITH PROPOFOL;  Surgeon: [name], MD;  Location: [facility];  Service: Endoscopy;  Laterality: N/A;  Diabetic - oral meds   CYST EXCISION  Aug. 2016   on back   CYSTOSCOPY W/ RETROGRADES Right 06/28/2018   Procedure: CYSTOSCOPY WITH RETROGRADE PYELOGRAM;  Surgeon: Billey Co, MD;  Location: [facility];  Service: Urology;  Laterality: Right;   CYSTOSCOPY W/ URETERAL STENT REMOVAL Right 07/22/2018   Procedure: CYSTOSCOPY WITH STENT REMOVAL;  Surgeon: Billey Co, MD;  Location: [facility];  Service: Urology;  Laterality: Right;   CYSTOSCOPY WITH BIOPSY Right 06/28/2018   Procedure: CYSTOSCOPY WITH RENAL BIOPSY;  Surgeon: Billey Co, MD;  Location: [facility];  Service: Urology;  Laterality: Right;   CYSTOSCOPY WITH URETEROSCOPY AND STENT PLACEMENT Right 06/28/2018   Procedure: CYSTOSCOPY WITH URETEROSCOPY AND STENT PLACEMENT;  Surgeon: Billey Co, MD;  Location: [facility];  Service: Urology;  Laterality: Right;   FULGURATION OF BLADDER TUMOR N/A 06/28/2018   Procedure: CYSTOSCOPY BLADDER BIOPSY, FULGERATION OF BLADDER;  Surgeon: Billey Co, MD;  Location: [facility];  Service: Urology;  Laterality: N/A;   KNEE SURGERY Left    LAPAROSCOPIC NEPHRECTOMY, HAND ASSISTED Right 07/22/2018   Procedure: HAND ASSISTED LAPAROSCOPIC NEPHRECTOMY;  Surgeon: Billey Co, MD;  Location: [facility];  Service: Urology;  Laterality: Right;   NASAL SINUS SURGERY  03/15   POLYPECTOMY N/A 02/23/2017   Procedure: POLYPECTOMY;  Surgeon: [name], MD;  Location: [facility];  Service: Endoscopy;  Laterality: N/A;   tooth abstraction     TRANSURETHRAL RESECTION OF BLADDER TUMOR N/A 05/12/2019   Procedure: TRANSURETHRAL RESECTION OF BLADDER TUMOR (TURBT);   Surgeon: Billey Co, MD;  Location: [facility];  Service: Urology;  Laterality: N/A;   VARICOCELE EXCISION      SOCIAL HISTORY: Social History   Socioeconomic History   Marital status: Married    Spouse name: Not on file   Number of children: Not on file   Years of education: Not on file   Highest education level: Not on file  Occupational History   Not on file  Tobacco Use   Smoking status: Former    Pack years: 0.00    Types: Cigarettes    Quit date: 06/15/2013    Years since quitting: 7.3   Smokeless tobacco: Never  Vaping Use   Vaping Use: Never used  Substance and Sexual Activity   Alcohol use: No    Alcohol/week: 0.0 standard drinks   Drug use: No   Sexual activity: Yes  Other Topics Concern   Not on file  Social History Narrative   Not on file   Social Determinants of Health   Financial Resource Strain: Not on file  Food Insecurity: Not on file  Transportation Needs: Not on file  Physical Activity: Not on file  Stress: Not on file  Social Connections: Not on file  Intimate Partner Violence: Not on file    FAMILY HISTORY: Family History  Problem Relation Age of Onset   Heart disease Father    Hypertension Father    COPD Father    Colon cancer Maternal Grandmother    Heart attack Paternal Grandfather     ALLERGIES:  is allergic to glucotrol [glipizide].  MEDICATIONS:  Current Outpatient Medications  Medication Sig Dispense Refill   aspirin 81 MG chewable tablet      clobetasol cream (TEMOVATE) 8.11 % Apply 1 application topically 2 (two) times daily. To affected skin; too strong for face, groin, underarms 30 g 2   cyclobenzaprine (FLEXERIL) 10 MG tablet Take 10 mg by mouth 3 (three) times daily as needed for muscle spasms.     Glucose Blood (ACCU-CHEK AVIVA PLUS VI) by In Vitro route.     JARDIANCE 10 MG TABS tablet TAKE 1 TABLET BY MOUTH DAILY (Patient taking differently: Take 10 mg by mouth daily.) 30 tablet 2   lisinopril (PRINIVIL,ZESTRIL)  10 MG tablet TAKE 1 TABLET(10 MG) BY MOUTH DAILY (Patient taking differently: Take 10 mg by mouth daily.) 90 tablet 1   loperamide (IMODIUM) 2 MG capsule Take 1 capsule (2 mg total) by mouth See admin instructions. With onset of loose stool, take 4mg  followed by 2mg  every 2 hours until 12 hours have passed without loose bowel movement. Maximum: 16 mg/day 60 capsule 0   metFORMIN (GLUCOPHAGE-XR) 500 MG 24 hr tablet Take 3 pills by mouth daily for 4-5 days, then increase to 4 pills daily (if tolerated) (Patient taking differently: Take 2,000 mg by mouth daily with breakfast.) 360 tablet 0   Multiple Vitamin (MULTIVITAMIN) tablet Take 1 tablet by mouth daily.     Omeprazole (PRILOSEC PO) Take 20 mg by mouth daily as needed (heart burn).      OZEMPIC, 0.25 OR 0.5 MG/DOSE, 2 MG/1.5ML SOPN once a week.      prochlorperazine (COMPAZINE) 10 MG  tablet Take 1 tablet (10 mg total) by mouth every 6 (six) hours as needed for nausea or vomiting. 15 tablet 0   rosuvastatin (CRESTOR) 10 MG tablet Take 1 tablet (10 mg total) by mouth at bedtime. 90 tablet 1   traZODone (DESYREL) 50 MG tablet Take 1 tablet (50 mg total) by mouth at bedtime. 30 tablet 0   No current facility-administered medications for this visit.     PHYSICAL EXAMINATION: ECOG PERFORMANCE STATUS: 1 - Symptomatic but completely ambulatory Vitals:   10/26/20 0921  BP: 128/87  Pulse: 88  Resp: 16  Temp: (!) 96.2 F (35.7 C)   Filed Weights   10/26/20 0921  Weight: 209 lb 12.8 oz (95.2 kg)    Physical Exam Constitutional:      General: He is not in acute distress.    Comments: Patient walks with a walker  HENT:     Head: Normocephalic and atraumatic.  Eyes:     General: No scleral icterus.    Pupils: Pupils are equal, round, and reactive to light.  Cardiovascular:     Rate and Rhythm: Normal rate and regular rhythm.     Heart sounds: Normal heart sounds.  Pulmonary:     Effort: Pulmonary effort is normal. No respiratory  distress.     Breath sounds: No wheezing.  Abdominal:     General: Bowel sounds are normal. There is no distension.     Palpations: Abdomen is soft. There is no mass.     Tenderness: There is no abdominal tenderness.     Comments: Right anterior abdominal wall bulging, worse with Valsalva and standing position.  Musculoskeletal:        General: No deformity. Normal range of motion.     Cervical back: Normal range of motion and neck supple.     Comments: Left knee status post knee replacement.  Some residual swelling  Skin:    General: Skin is warm and dry.     Findings: No erythema or rash.  Neurological:     Mental Status: He is alert and oriented to person, place, and time. Mental status is at baseline.     Cranial Nerves: No cranial nerve deficit.     Coordination: Coordination normal.  Psychiatric:        Mood and Affect: Mood normal.    LABORATORY DATA:  I have reviewed the data as listed Lab Results  Component Value Date   WBC 7.8 10/26/2020   HGB 13.6 10/26/2020   HCT 41.4 10/26/2020   MCV 88.8 10/26/2020   PLT 228 10/26/2020   Recent Labs    12/25/19 1031 12/25/19 1125 01/05/20 0840 01/12/20 1105 01/26/20 0829 02/12/20 1051 02/19/20 1115 02/27/20 1055 03/16/20 0830 09/14/20 0838 10/05/20 0836 10/26/20 0859  NA 134*  --  134*  --  135  --   --   --    < > 135 136 136  K 4.7  --  4.4  --  4.6  --   --   --    < > 4.3 4.5 4.4  CL 104  --  101  --  102  --   --   --    < > 98 98 100  CO2 24  --  27  --  24  --   --   --    < > 25 26 28   GLUCOSE 98  --  94  --  96  --   --   --    < >  155* 148* 135*  BUN 24*  --  26*  --  23*  --   --   --    < > 21* 24* 24*  CREATININE 1.04  --  1.20  --  1.10  --   --   --    < > 0.96 1.10 0.95  CALCIUM 8.8*  --  9.1  --  9.2  --   --   --    < > 9.7 9.7 9.5  GFRNONAA >60  --  >60  --  >60  --   --   --    < > >60 >60 >60  GFRAA >60  --  >60  --  >60  --   --   --   --   --   --   --   PROT 6.8   < > 6.9   < > 6.6 6.7 6.8  6.9   < > 8.1 7.6 7.3  ALBUMIN 4.0   < > 4.2   < > 3.9 4.1 4.0 4.2   < > 4.1 4.2 4.1  AST 24   < > 23   < > 183* 130* 99* 67*   < > 21 20 22   ALT 22   < > 21   < > 336* 266* 203* 127*   < > 20 21 22   ALKPHOS 46   < > 50   < > 60 66 64 57   < > 90 76 73  BILITOT 0.8   < > 0.7   < > 0.9 1.2 1.1 1.0   < > 0.6 0.8 0.5  BILIDIR  --    < >  --    < >  --  0.3* 0.2 0.2  --   --   --   --   IBILI  --    < >  --    < >  --  0.9 0.9 0.8  --   --   --   --    < > = values in this interval not displayed.    Iron/TIBC/Ferritin/ %Sat    Component Value Date/Time   IRON 118 09/25/2019 1115   TIBC 454 (H) 09/25/2019 1115   FERRITIN 236 09/25/2019 1115   IRONPCTSAT 26 09/25/2019 1115      RADIOGRAPHIC STUDIES: I have personally reviewed the radiological images as listed and agreed with the findings in the report.  CT CHEST ABDOMEN PELVIS WO CONTRAST  Result Date: 09/30/2020 CLINICAL DATA:  Restaging metastatic renal cell carcinoma. EXAM: CT CHEST, ABDOMEN AND PELVIS WITHOUT CONTRAST TECHNIQUE: Multidetector CT imaging of the chest, abdomen and pelvis was performed following the standard protocol without IV contrast. COMPARISON:  Multiple priors including CT chest abdomen pelvis May 11, 2020 FINDINGS: CT CHEST FINDINGS Cardiovascular: Aortic atherosclerosis without aneurysmal dilation. Coronary artery calcifications. Normal size heart. No significant pericardial effusion/thickening. Mediastinum/Nodes: Thyroid unremarkable. No pathologically enlarged mediastinal, hilar or axillary lymph nodes visualized within the limitations of noncontrast examination. Trachea is unremarkable. Lungs/Pleura: No significant change in size of the scattered small pulmonary nodules. Index nodule within the posterolateral left upper lobe on image 46/3 measures 4 mm, unchanged. No new suspicious pulmonary nodule. Scattered areas of subsegmental atelectasis in the left lung base lingula and right middle lobe appears similar  prior. No pleural effusion. No pneumothorax. Musculoskeletal: No chest wall mass or suspicious bone lesions identified. CT ABDOMEN PELVIS FINDINGS Hepatobiliary: Hepatic parenchyma is grossly unremarkable in noncontrast appearance.  Gallbladder is grossly unremarkable. No biliary ductal dilation. Pancreas: Unremarkable. No pancreatic ductal dilatation or surrounding inflammatory changes. Spleen: Normal in size without focal abnormality. Adrenals/Urinary Tract: Bilateral adrenal glands unremarkable. Prior right nephrectomy without new suspicious soft tissue nodularity in the nephrectomy bed, to suggest local recurrence. Left kidney demonstrates no hydronephrosis or renal stones. Hypodense 1.2 cm cortical lesion in the interpolar region of the left kidney on image 72/2, consistent with a cyst. Stomach/Bowel: Stomach is grossly unremarkable. Enteric contrast traverses the terminal ileum. No pathologic dilation of small bowel. Appendix is grossly unremarkable. No suspicious colonic wall thickening or masslike lesions. Vascular/Lymphatic: Aortic atherosclerosis without aneurysmal dilation. No pathologically enlarged abdominal or pelvic lymph nodes. Reproductive: Prostate is unremarkable. Other: No abdominopelvic ascites. Musculoskeletal: No acute or significant osseous findings. IMPRESSION: 1. Stable examination without evidence of local recurrence or new/progressive metastatic disease in the chest abdomen or pelvis. 2. Tiny nonspecific pulmonary nodules are unchanged from prior examination. No new suspicious pulmonary nodule. 3. Aortic atherosclerosis. Aortic Atherosclerosis (ICD10-I70.0). Electronically Signed   By: Dahlia Bailiff MD   On: 09/30/2020 19:14       ASSESSMENT & PLAN:  1. Clear cell carcinoma of right kidney ([place])   2. Encounter for antineoplastic immunotherapy   3. History of left knee replacement   4. 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 Labs reviewed and discussed with patient.  Proceed with Keytruda maintenance. Next CT surveillance will be obtained in August 2022  Recent history of knee replacement.  Follow-up with orthopedic surgeon.  Continue aspirin 81 mg for DVT prophylaxis. Abdominal wall hernia, patient planned to have elective hernia surgery in summer  Follow-up in 3 weeks We spent sufficient time to discuss many aspect of care, questions were answered to patient's satisfaction. All questions were answered. The patient knows to call the clinic with any problems questions or concerns.  Earlie Server, MD, PhD Hematology Oncology [facility] at [facility] Pager- [phone] 10/26/2020

## 2020-10-26 NOTE — Patient Instructions (Signed)
[facility] [place] [facility]  Discharge Instructions: Thank you for choosing [facility] to provide your oncology and hematology care.  If you have a lab appointment with the Cancer Center, please go directly to the Cancer Center and check in at the registration area.  Wear comfortable clothing and clothing appropriate for easy access to any Portacath or PICC line.   We strive to give you quality time with your provider. You may need to reschedule your appointment if you arrive late (15 or more minutes).  Arriving late affects you and other patients whose appointments are after yours.  Also, if you miss three or more appointments without notifying the office, you may be dismissed from the clinic at the provider's discretion.      For prescription refill requests, have your pharmacy contact our office and allow 72 hours for refills to be completed.    Today you received the following chemotherapy and/or immunotherapy agents keytruda  Pembrolizumab injection What is this medication? PEMBROLIZUMAB (pem broe liz ue mab) is a monoclonal antibody. It is used totreat certain types of cancer. This medicine may be used for other purposes; ask your health care provider orpharmacist if you have questions. COMMON BRAND NAME(S): Keytruda What should I tell my care team before I take this medication? They need to know if you have any of these conditions: autoimmune diseases like Crohn's disease, ulcerative colitis, or lupus have had or planning to have an allogeneic stem cell transplant (uses someone else's stem cells) history of organ transplant history of chest radiation nervous system problems like myasthenia gravis or Guillain-Barre syndrome an unusual or allergic reaction to pembrolizumab, other medicines, foods, dyes, or preservatives pregnant or trying to get pregnant breast-feeding How should I use this medication? This medicine is for infusion into a vein. It  is given by a health careprofessional in a hospital or clinic setting. A special MedGuide will be given to you before each treatment. Be sure to readthis information carefully each time. Talk to your pediatrician regarding the use of this medicine in children. While this drug may be prescribed for children as young as 6 months for selectedconditions, precautions do apply. Overdosage: If you think you have taken too much of this medicine contact apoison control center or emergency room at once. NOTE: This medicine is only for you. Do not share this medicine with others. What if I miss a dose? It is important not to miss your dose. Call your doctor or health careprofessional if you are unable to keep an appointment. What may interact with this medication? Interactions have not been studied. This list may not describe all possible interactions. Give your health care provider a list of all the medicines, herbs, non-prescription drugs, or dietary supplements you use. Also tell them if you smoke, drink alcohol, or use illegaldrugs. Some items may interact with your medicine. What should I watch for while using this medication? Your condition will be monitored carefully while you are receiving thismedicine. You may need blood work done while you are taking this medicine. Do not become pregnant while taking this medicine or for 4 months after stopping it. Women should inform their doctor if they wish to become pregnant or think they might be pregnant. There is a potential for serious side effects to an unborn child. Talk to your health care professional or pharmacist for more information. Do not breast-feed an infant while taking this medicine orfor 4 months after the last dose. What side effects   may I notice from receiving this medication? Side effects that you should report to your doctor or health care professionalas soon as possible: allergic reactions like skin rash, itching or hives, swelling of the  face, lips, or tongue bloody or black, tarry breathing problems changes in vision chest pain chills confusion constipation cough diarrhea dizziness or feeling faint or lightheaded fast or irregular heartbeat fever flushing joint pain low blood counts - this medicine may decrease the number of white blood cells, red blood cells and platelets. You may be at increased risk for infections and bleeding. muscle pain muscle weakness pain, tingling, numbness in the hands or feet persistent headache redness, blistering, peeling or loosening of the skin, including inside the mouth signs and symptoms of high blood sugar such as dizziness; dry mouth; dry skin; fruity breath; nausea; stomach pain; increased hunger or thirst; increased urination signs and symptoms of kidney injury like trouble passing urine or change in the amount of urine signs and symptoms of liver injury like dark urine, light-colored stools, loss of appetite, nausea, right upper belly pain, yellowing of the eyes or skin sweating swollen lymph nodes weight loss Side effects that usually do not require medical attention (report to yourdoctor or health care professional if they continue or are bothersome): decreased appetite hair loss tiredness This list may not describe all possible side effects. Call your doctor for medical advice about side effects. You may report side effects to FDA at1-800-FDA-1088. Where should I keep my medication? This drug is given in a hospital or clinic and will not be stored at home. NOTE: This sheet is a summary. It may not cover all possible information. If you have questions about this medicine, talk to your doctor, pharmacist, orhealth care provider.  2022 Elsevier/Gold Standard (2019-04-02 21:44:53)    To help prevent nausea and vomiting after your treatment, we encourage you to take your nausea medication as directed.  BELOW ARE SYMPTOMS THAT SHOULD BE REPORTED IMMEDIATELY: *FEVER  GREATER THAN 100.4 F (38 C) OR HIGHER *CHILLS OR SWEATING *NAUSEA AND VOMITING THAT IS NOT CONTROLLED WITH YOUR NAUSEA MEDICATION *UNUSUAL SHORTNESS OF BREATH *UNUSUAL BRUISING OR BLEEDING *URINARY PROBLEMS (pain or burning when urinating, or frequent urination) *BOWEL PROBLEMS (unusual diarrhea, constipation, pain near the anus) TENDERNESS IN MOUTH AND THROAT WITH OR WITHOUT PRESENCE OF ULCERS (sore throat, sores in mouth, or a toothache) UNUSUAL RASH, SWELLING OR PAIN  UNUSUAL VAGINAL DISCHARGE OR ITCHING   Items with * indicate a potential emergency and should be followed up as soon as possible or go to the Emergency Department if any problems should occur.  Please show the CHEMOTHERAPY ALERT CARD or IMMUNOTHERAPY ALERT CARD at check-in to the Emergency Department and triage nurse.  Should you have questions after your visit or need to cancel or reschedule your appointment, please contact [facility] [place] [facility]  [phone] and follow the prompts.  Office hours are 8:00 a.m. to 4:30 p.m. Monday - Friday. Please note that voicemails left after 4:00 p.m. may not be returned until the following business day.  We are closed weekends and major holidays. You have access to a nurse at all times for urgent questions. Please call the main number to the clinic [phone] and follow the prompts.  For any non-urgent questions, you may also contact your provider using MyChart. We now offer e-Visits for anyone 18 and older to request care online for non-urgent symptoms. For details visit [email or web address].   Also download the MyChart app! Go   to the app store, search "MyChart", open the app, select [facility], and log in with your MyChart username and password.  Due to Covid, a mask is required upon entering the hospital/clinic. If you do not have a mask, one will be given to you upon arrival. For doctor visits, patients may have 1 support person aged 18 or older with  them. For treatment visits, patients cannot have anyone with them due to current Covid guidelines and our immunocompromised population.  

## 2020-11-11 ENCOUNTER — Other Ambulatory Visit: Payer: Self-pay

## 2020-11-11 ENCOUNTER — Other Ambulatory Visit
Admission: RE | Admit: 2020-11-11 | Discharge: 2020-11-11 | Disposition: A | Discharge status: Home / Self Care | Payer: BC Managed Care – PPO | Source: Ambulatory Visit | Attending: Surgery | Admitting: Surgery

## 2020-11-11 NOTE — Patient Instructions (Addendum)
Your procedure is scheduled on: 11/18/20 Report to the Registration Desk on the 1st floor of the [facility]. To find out your arrival time, please call [phone] between 1PM - 3PM on: 11/17/20 Report to [facility] 11/16/20 at 1145 am for Ekg and Bag.  REMEMBER: Instructions that are not followed completely may result in serious medical risk, up to and including death; or upon the discretion of your surgeon and anesthesiologist your surgery may need to be rescheduled.  Do not eat food after midnight the night before surgery.  No gum chewing, lozengers or hard candies.  You may however, drink CLEAR liquids up to 2 hours before you are scheduled to arrive for your surgery. Do not drink anything within 2 hours of your scheduled arrival time. Type 1 and Type 2 diabetics should only drink water.  In addition, your doctor has ordered for you to drink the provided  Gatorade G2 Drinking this carbohydrate drink up to two hours before surgery helps to reduce insulin resistance and improve patient outcomes. Please complete drinking 2 hours prior to scheduled arrival time.  TAKE THESE MEDICATIONS THE MORNING OF SURGERY WITH A SIP OF WATER:  - omeprazole (PRILOSEC) 20 MG capsule, take one the night before and one on the morning of surgery - helps to prevent nausea after surgery.   - Stop taking JARDIANCE 10 MG TABS tablet beginning 11/15/20, may resume the day after surgery.  - Stop Metformin  2 days prior to surgery. Do not take 07/05, 07/06 and do not take the day of surgery, may resume day after surgery..  Follow recommendations from Cardiologist, Pulmonologist or PCP regarding stopping Aspirin, Coumadin, Plavix, Eliquis, Pradaxa, or Pletal. Stop taking Aspirin 81 mg beginning 07/02, may resume the day after surgery.  One week prior to surgery: Stop Anti-inflammatories (NSAIDS) such as Advil, Aleve, Ibuprofen, Motrin, Naproxen, Naprosyn and Aspirin based products such as Excedrin, Goodys  Powder, BC Powder.  Stop ANY OVER THE COUNTER supplements until after surgery. Beginning 11/11/20 stop your multivitamin, may resume after your surgery.  You may take Tylenol if needed for pain up until the day of surgery.  No Alcohol for 24 hours before or after surgery.  No Smoking including e-cigarettes for 24 hours prior to surgery.  No chewable tobacco products for at least 6 hours prior to surgery.  No nicotine patches on the day of surgery.  Do not use any "recreational" drugs for at least a week prior to your surgery.  Please be advised that the combination of cocaine and anesthesia may have negative outcomes, up to and including death. If you test positive for cocaine, your surgery will be cancelled.  On the morning of surgery brush your teeth with toothpaste and water, you may rinse your mouth with mouthwash if you wish. Do not swallow any toothpaste or mouthwash.  Do not wear jewelry, make-up, hairpins, clips or nail polish.  Do not wear lotions, powders, or perfumes.   Do not shave body from the neck down 48 hours prior to surgery just in case you cut yourself which could leave a site for infection.  Also, freshly shaved skin may become irritated if using the CHG soap.  Contact lenses, hearing aids and dentures may not be worn into surgery.  Do not bring valuables to the hospital. [facility] is not responsible for any missing/lost belongings or valuables.   Use CHG Soap or wipes as directed on instruction sheet.  Notify your doctor if there is any change  in your medical condition (cold, fever, infection).  Wear comfortable clothing (specific to your surgery type) to the hospital.  After surgery, you can help prevent lung complications by doing breathing exercises.  Take deep breaths and cough every 1-2 hours. Your doctor may order a device called an Incentive Spirometer to help you take deep breaths. When coughing or sneezing, hold a pillow firmly against your  incision with both hands. This is called "splinting." Doing this helps protect your incision. It also decreases belly discomfort.  If you are being admitted to the hospital overnight, leave your suitcase in the car. After surgery it may be brought to your room.  If you are being discharged the day of surgery, you will not be allowed to drive home. You will need a responsible adult (18 years or older) to drive you home and stay with you that night.   If you are taking public transportation, you will need to have a responsible adult (18 years or older) with you. Please confirm with your physician that it is acceptable to use public transportation.   Please call the [facility] at [phone] if you have any questions about these instructions.  Surgery Visitation Policy:  Patients undergoing a surgery or procedure may have one family member or support person with them as long as that person is not COVID-19 positive or experiencing its symptoms.  That person may remain in the waiting area during the procedure.  Inpatient Visitation:    Visiting hours are 7 a.m. to 8 p.m. Inpatients will be allowed two visitors daily. The visitors may change each day during the patient's stay. No visitors under the age of 45. Any visitor under the age of 8 must be accompanied by an adult. The visitor must pass COVID-19 screenings, use hand sanitizer when entering and exiting the patient's room and wear a mask at all times, including in the patient's room. Patients must also wear a mask when staff or their visitor are in the room. Masking is required regardless of vaccination status.

## 2020-11-11 NOTE — Addendum Note (Signed)
Addended by: Caroleen Hamman F on: 11/11/2020 10:47 AM   Modules accepted: Orders, SmartSet

## 2020-11-12 DIAGNOSIS — I10 Essential (primary) hypertension: Secondary | ICD-10-CM | POA: Insufficient documentation

## 2020-11-12 DIAGNOSIS — Z96653 Presence of artificial knee joint, bilateral: Secondary | ICD-10-CM | POA: Insufficient documentation

## 2020-11-12 DIAGNOSIS — C641 Malignant neoplasm of right kidney, except renal pelvis: Secondary | ICD-10-CM | POA: Insufficient documentation

## 2020-11-12 DIAGNOSIS — K219 Gastro-esophageal reflux disease without esophagitis: Secondary | ICD-10-CM | POA: Insufficient documentation

## 2020-11-12 DIAGNOSIS — E785 Hyperlipidemia, unspecified: Secondary | ICD-10-CM | POA: Insufficient documentation

## 2020-11-12 DIAGNOSIS — K439 Ventral hernia without obstruction or gangrene: Secondary | ICD-10-CM | POA: Insufficient documentation

## 2020-11-12 DIAGNOSIS — C7911 Secondary malignant neoplasm of bladder: Secondary | ICD-10-CM | POA: Insufficient documentation

## 2020-11-12 DIAGNOSIS — Z87891 Personal history of nicotine dependence: Secondary | ICD-10-CM | POA: Insufficient documentation

## 2020-11-12 DIAGNOSIS — Z905 Acquired absence of kidney: Secondary | ICD-10-CM | POA: Insufficient documentation

## 2020-11-12 DIAGNOSIS — E119 Type 2 diabetes mellitus without complications: Secondary | ICD-10-CM | POA: Insufficient documentation

## 2020-11-12 DIAGNOSIS — L409 Psoriasis, unspecified: Secondary | ICD-10-CM | POA: Insufficient documentation

## 2020-11-12 DIAGNOSIS — Z7984 Long term (current) use of oral hypoglycemic drugs: Secondary | ICD-10-CM | POA: Insufficient documentation

## 2020-11-12 DIAGNOSIS — C7800 Secondary malignant neoplasm of unspecified lung: Secondary | ICD-10-CM | POA: Insufficient documentation

## 2020-11-12 DIAGNOSIS — Z5112 Encounter for antineoplastic immunotherapy: Secondary | ICD-10-CM | POA: Insufficient documentation

## 2020-11-12 DIAGNOSIS — Z79899 Other long term (current) drug therapy: Secondary | ICD-10-CM | POA: Insufficient documentation

## 2020-11-16 ENCOUNTER — Inpatient Hospital Stay: Payer: BC Managed Care – PPO | Attending: Oncology

## 2020-11-16 ENCOUNTER — Other Ambulatory Visit: Payer: Self-pay

## 2020-11-16 ENCOUNTER — Inpatient Hospital Stay (HOSPITAL_BASED_OUTPATIENT_CLINIC_OR_DEPARTMENT_OTHER): Payer: BC Managed Care – PPO | Admitting: Oncology

## 2020-11-16 ENCOUNTER — Encounter: Payer: Self-pay | Admitting: Oncology

## 2020-11-16 ENCOUNTER — Inpatient Hospital Stay: Payer: BC Managed Care – PPO

## 2020-11-16 ENCOUNTER — Other Ambulatory Visit
Admission: RE | Admit: 2020-11-16 | Discharge: 2020-11-16 | Disposition: A | Discharge status: Home / Self Care | Payer: BC Managed Care – PPO | Source: Ambulatory Visit | Attending: Surgery | Admitting: Surgery

## 2020-11-16 VITALS — BP 133/88 | HR 89 | Temp 96.5°F | Resp 18 | Wt 210.6 lb

## 2020-11-16 DIAGNOSIS — C641 Malignant neoplasm of right kidney, except renal pelvis: Secondary | ICD-10-CM

## 2020-11-16 DIAGNOSIS — L409 Psoriasis, unspecified: Secondary | ICD-10-CM | POA: Diagnosis not present

## 2020-11-16 DIAGNOSIS — C7911 Secondary malignant neoplasm of bladder: Secondary | ICD-10-CM | POA: Diagnosis not present

## 2020-11-16 DIAGNOSIS — Z5112 Encounter for antineoplastic immunotherapy: Secondary | ICD-10-CM

## 2020-11-16 DIAGNOSIS — Z79899 Other long term (current) drug therapy: Secondary | ICD-10-CM | POA: Diagnosis not present

## 2020-11-16 DIAGNOSIS — K439 Ventral hernia without obstruction or gangrene: Secondary | ICD-10-CM | POA: Diagnosis not present

## 2020-11-16 DIAGNOSIS — Z87891 Personal history of nicotine dependence: Secondary | ICD-10-CM | POA: Diagnosis not present

## 2020-11-16 DIAGNOSIS — Z96653 Presence of artificial knee joint, bilateral: Secondary | ICD-10-CM | POA: Diagnosis not present

## 2020-11-16 DIAGNOSIS — Z85528 Personal history of other malignant neoplasm of kidney: Secondary | ICD-10-CM | POA: Diagnosis not present

## 2020-11-16 DIAGNOSIS — Z96652 Presence of left artificial knee joint: Secondary | ICD-10-CM | POA: Diagnosis not present

## 2020-11-16 DIAGNOSIS — E785 Hyperlipidemia, unspecified: Secondary | ICD-10-CM | POA: Diagnosis not present

## 2020-11-16 DIAGNOSIS — Z905 Acquired absence of kidney: Secondary | ICD-10-CM | POA: Diagnosis not present

## 2020-11-16 DIAGNOSIS — C7800 Secondary malignant neoplasm of unspecified lung: Secondary | ICD-10-CM | POA: Diagnosis not present

## 2020-11-16 DIAGNOSIS — K219 Gastro-esophageal reflux disease without esophagitis: Secondary | ICD-10-CM | POA: Diagnosis not present

## 2020-11-16 DIAGNOSIS — E119 Type 2 diabetes mellitus without complications: Secondary | ICD-10-CM | POA: Diagnosis not present

## 2020-11-16 DIAGNOSIS — Z01812 Encounter for preprocedural laboratory examination: Secondary | ICD-10-CM | POA: Insufficient documentation

## 2020-11-16 DIAGNOSIS — K432 Incisional hernia without obstruction or gangrene: Secondary | ICD-10-CM | POA: Diagnosis not present

## 2020-11-16 DIAGNOSIS — Z7984 Long term (current) use of oral hypoglycemic drugs: Secondary | ICD-10-CM | POA: Diagnosis not present

## 2020-11-16 DIAGNOSIS — I10 Essential (primary) hypertension: Secondary | ICD-10-CM | POA: Diagnosis not present

## 2020-11-16 LAB — CBC WITH DIFFERENTIAL/PLATELET
Abs Immature Granulocytes: 0.07 10*3/uL (ref 0.00–0.07)
Basophils Absolute: 0.1 10*3/uL (ref 0.0–0.1)
Basophils Relative: 1 %
Eosinophils Absolute: 0.6 10*3/uL — ABNORMAL HIGH (ref 0.0–0.5)
Eosinophils Relative: 6 %
HCT: 43.8 % (ref 39.0–52.0)
Hemoglobin: 14.2 g/dL (ref 13.0–17.0)
Immature Granulocytes: 1 %
Lymphocytes Relative: 28 %
Lymphs Abs: 2.5 10*3/uL (ref 0.7–4.0)
MCH: 28.7 pg (ref 26.0–34.0)
MCHC: 32.4 g/dL (ref 30.0–36.0)
MCV: 88.5 fL (ref 80.0–100.0)
Monocytes Absolute: 0.5 10*3/uL (ref 0.1–1.0)
Monocytes Relative: 6 %
Neutro Abs: 5.1 10*3/uL (ref 1.7–7.7)
Neutrophils Relative %: 58 %
Platelets: 246 10*3/uL (ref 150–400)
RBC: 4.95 MIL/uL (ref 4.22–5.81)
RDW: 12.8 % (ref 11.5–15.5)
WBC: 8.8 10*3/uL (ref 4.0–10.5)
nRBC: 0 % (ref 0.0–0.2)

## 2020-11-16 LAB — COMPREHENSIVE METABOLIC PANEL
ALT: 18 U/L (ref 0–44)
AST: 21 U/L (ref 15–41)
Albumin: 4.1 g/dL (ref 3.5–5.0)
Alkaline Phosphatase: 66 U/L (ref 38–126)
Anion gap: 6 (ref 5–15)
BUN: 21 mg/dL — ABNORMAL HIGH (ref 6–20)
CO2: 27 mmol/L (ref 22–32)
Calcium: 9.2 mg/dL (ref 8.9–10.3)
Chloride: 101 mmol/L (ref 98–111)
Creatinine, Ser: 0.86 mg/dL (ref 0.61–1.24)
GFR, Estimated: >60 mL/min (ref >60)
Glucose, Bld: 129 mg/dL — ABNORMAL HIGH (ref 70–99)
Potassium: 4.3 mmol/L (ref 3.5–5.1)
Sodium: 134 mmol/L — ABNORMAL LOW (ref 135–145)
Total Bilirubin: 0.6 mg/dL (ref 0.3–1.2)
Total Protein: 7.1 g/dL (ref 6.5–8.1)

## 2020-11-16 LAB — TSH: TSH: 1.326 u[IU]/mL (ref 0.350–4.500)

## 2020-11-16 MED ORDER — SODIUM CHLORIDE 0.9 % IV SOLN
Freq: Once | INTRAVENOUS | Status: AC
  Filled 2020-11-16: qty 250

## 2020-11-16 MED ORDER — SODIUM CHLORIDE 0.9 % IV SOLN
200.0000 mg | Freq: Once | INTRAVENOUS | Status: AC
  Administered 2020-11-16: 200 mg via INTRAVENOUS
  Filled 2020-11-16: qty 8

## 2020-11-16 NOTE — Progress Notes (Signed)
Patient denies new problems/concerns today.   °

## 2020-11-16 NOTE — Patient Instructions (Signed)
[facility] [place] [facility]  Discharge Instructions: Thank you for choosing [facility] to provide your oncology and hematology care.  If you have a lab appointment with the Cancer Center, please go directly to the Cancer Center and check in at the registration area.  Wear comfortable clothing and clothing appropriate for easy access to any Portacath or PICC line.   We strive to give you quality time with your provider. You may need to reschedule your appointment if you arrive late (15 or more minutes).  Arriving late affects you and other patients whose appointments are after yours.  Also, if you miss three or more appointments without notifying the office, you may be dismissed from the clinic at the provider's discretion.      For prescription refill requests, have your pharmacy contact our office and allow 72 hours for refills to be completed.    Today you received the following chemotherapy and/or immunotherapy agents : Keytruda    To help prevent nausea and vomiting after your treatment, we encourage you to take your nausea medication as directed.  BELOW ARE SYMPTOMS THAT SHOULD BE REPORTED IMMEDIATELY: *FEVER GREATER THAN 100.4 F (38 C) OR HIGHER *CHILLS OR SWEATING *NAUSEA AND VOMITING THAT IS NOT CONTROLLED WITH YOUR NAUSEA MEDICATION *UNUSUAL SHORTNESS OF BREATH *UNUSUAL BRUISING OR BLEEDING *URINARY PROBLEMS (pain or burning when urinating, or frequent urination) *BOWEL PROBLEMS (unusual diarrhea, constipation, pain near the anus) TENDERNESS IN MOUTH AND THROAT WITH OR WITHOUT PRESENCE OF ULCERS (sore throat, sores in mouth, or a toothache) UNUSUAL RASH, SWELLING OR PAIN  UNUSUAL VAGINAL DISCHARGE OR ITCHING   Items with * indicate a potential emergency and should be followed up as soon as possible or go to the Emergency Department if any problems should occur.  Please show the CHEMOTHERAPY ALERT CARD or IMMUNOTHERAPY ALERT CARD at check-in  to the Emergency Department and triage nurse.  Should you have questions after your visit or need to cancel or reschedule your appointment, please contact [facility] [place] [facility]  [phone] and follow the prompts.  Office hours are 8:00 a.m. to 4:30 p.m. Monday - Friday. Please note that voicemails left after 4:00 p.m. may not be returned until the following business day.  We are closed weekends and major holidays. You have access to a nurse at all times for urgent questions. Please call the main number to the clinic [phone] and follow the prompts.  For any non-urgent questions, you may also contact your provider using MyChart. We now offer e-Visits for anyone 18 and older to request care online for non-urgent symptoms. For details visit [email or web address].   Also download the MyChart app! Go to the app store, search "MyChart", open the app, select , and log in with your MyChart username and password.  Due to Covid, a mask is required upon entering the hospital/clinic. If you do not have a mask, one will be given to you upon arrival. For doctor visits, patients may have 1 support person aged 18 or older with them. For treatment visits, patients cannot have anyone with them due to current Covid guidelines and our immunocompromised population.  

## 2020-11-16 NOTE — Progress Notes (Signed)
Hematology/Oncology follow up note [facility] Telephone:([phone]) [phone] Fax:(336) [phone]   Patient Care Team: Danae Orleans, MD as PCP - General (Internal Medicine) [name], [name], MD as PCP - Family Medicine (Family Medicine) [name], MD as Consulting Physician (General Surgery) [name], [name], MD as Attending Physician (Family Medicine) [name], [name], MD as Consulting Physician (Dermatology)  REFERRING PROVIDER: Danae Orleans, MD  CHIEF COMPLAINTS/REASON FOR VISIT:  Follow-up of kidney cancer  HISTORY OF PRESENTING ILLNESS:   Matthew Woodard is a  55 y.o.  male with PMH listed below was seen in consultation at the request of  Danae Orleans, MD  for evaluation of kidney cancer Patient's cancer history dated back to February 2020 when he developed gross hematuria with small clots and right-sided flank pain.  Patient had a CT urogram done which showed right 4 cm central enhancing renal mass with invasion into the collecting system.  X-ray of chest was performed to see complete staging which showed no concerning findings. 06/28/2018 patient underwent a cystoscopy, bladder biopsy and a right retrograde pyelogram with intraoperative interpretation, right diagnostic ureteroscopy, right renal pelvis biopsy, right ureteral stent placement.  biopsy showed atypical cell clusters, with extensive crush artifact, nondiagnostic.   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  Surgical margins negative for tumor.pT3a,Nx Patient has been on surveillance after surgery. 10/28/2018 CT abdomen pelvis with contrast showed minimal fluid and or postoperative changes within the right renal fossa.  No evidence of metastatic disease or recurrence.  Patient has left kidney lesion previously  characterized as nonenhancing cyst by multi phasic contrast-enhanced CT. Patient reports an episode of gross hematuria in early December 2020, patient is due for 54-month surveillance CT scan. 04/30/2019 CT abdomen pelvis with contrast showed high attenuation mass at the right uretero vesicle junction, with extension into the bladder.  Bilateral pulmonary nodules, most indicated for metastatic disease.   Patient underwent cystoscopy and transurethral resection of irregular bladder tumor 3 cm.  Mass appeared to emanate from the right ureteral orifice.  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 Patient was referred to me for further work-up and management.  #Staging chest CT with contrast showed numerous bilateral pulmonary nodules.  Compatible with metastatic disease. Bone scan showed no osseous metastatic disease.   #History of tobacco abuse, former smoker.  Quit in 2015.  He denies any hemoptysis, chest pain, shortness of breath today. Patient reports that hematuria has completely resolved.  Denies any pain today. He is accompanied by his wife.  #History of cutaneous psoriasis, mostly on his right hand, currently on weekly methotrexate 12.5 mg with good symptom control. #NGS: [facility] PD L1 TPS 1%  # patient was referred to ENT for evaluation of headache and sinus pressure.  He had a ENT evaluation and feels that patient does not have sinusitis.  [name] recommended MRI brain to rule out PRES syndrome.  Axitinib has been held MRI brain was done and was negative.   #09/2019 off axitinib and Keytruda since the beginning of May due to transaminitis. Patient was seen and evaluated by gastroenterology [name]. Work-up was negative. Ultrasound liver vascular Doppler is negative. Recommend observation. #11/24/2019, resumed on Keytruda every 3 weeks #01/05/2020, axitinib was resumed 3 mg #01/20/2020, axitinib was discontinued due to recurrence of  transaminitis  Beryle Flock was temporarily held. #  03/16/2020, resumed on Keytruda every 3 weeks. 09/29/2020 CT scan showed no evidence of local recurrence or new/progressive disease in the chest abdomen or pelvis.  Tiny nonspecific lung nodules are unchanged.  No new lung nodules. May 2022, left knee replacement  INTERVAL HISTORY Matthew Woodard is a 55 y.o. male who has above history reviewed by me today presents for follow up visit for management of metastatic RCC. Patient reports feeling well.  He lost his stepfather about 3 weeks ago. He recently has experienced 2 episodes of nausea and vomiting.  One episode was after he ate at [employer].  Another episode after a breakfast.  Currently no nausea vomiting.   Review of Systems  Constitutional:  Negative for appetite change, chills, fatigue, fever and unexpected weight change.  HENT:   Negative for hearing loss and voice change.   Eyes:  Negative for eye problems and icterus.  Respiratory:  Negative for chest tightness, cough and shortness of breath.   Cardiovascular:  Negative for chest pain and leg swelling.  Gastrointestinal:  Negative for abdominal distention, abdominal pain, blood in stool and nausea.  Endocrine: Negative for hot flashes.  Genitourinary:  Negative for difficulty urinating, dysuria and frequency.   Musculoskeletal:  Positive for arthralgias.  Skin:  Negative for itching and rash.  Neurological:  Negative for extremity weakness, headaches, light-headedness and numbness.  Hematological:  Negative for adenopathy. Does not bruise/bleed easily.  Psychiatric/Behavioral:  Negative for confusion.    MEDICAL HISTORY:  Past Medical History:  Diagnosis Date   Arthritis    hands, left knee   Cancer ([place])    Diabetes mellitus without complication ([place])    Eczema 01/08/2017   GERD (gastroesophageal reflux disease)    History of kidney cancer    Hyperlipidemia LDL goal <100 11/02/2014   Hypertension    Kidney stone     YEAR H/O HEMATURIA   Psoriasis    Wears dentures    full upper, partial lower.  Has, does not wear    SURGICAL HISTORY: Past Surgical History:  Procedure Laterality Date   COLONOSCOPY WITH PROPOFOL N/A 02/23/2017   Procedure: COLONOSCOPY WITH PROPOFOL;  Surgeon: [name], MD;  Location: [place];  Service: Endoscopy;  Laterality: N/A;  Diabetic - oral meds   CYST EXCISION  Aug. 2016   on back   CYSTOSCOPY W/ RETROGRADES Right 06/28/2018   Procedure: CYSTOSCOPY WITH RETROGRADE PYELOGRAM;  Surgeon: Billey Co, MD;  Location: [facility];  Service: Urology;  Laterality: Right;   CYSTOSCOPY W/ URETERAL STENT REMOVAL Right 07/22/2018   Procedure: CYSTOSCOPY WITH STENT REMOVAL;  Surgeon: Billey Co, MD;  Location: [facility];  Service: Urology;  Laterality: Right;   CYSTOSCOPY WITH BIOPSY Right 06/28/2018   Procedure: CYSTOSCOPY WITH RENAL BIOPSY;  Surgeon: Billey Co, MD;  Location: [facility];  Service: Urology;  Laterality: Right;   CYSTOSCOPY WITH URETEROSCOPY AND STENT PLACEMENT Right 06/28/2018   Procedure: CYSTOSCOPY WITH URETEROSCOPY AND STENT PLACEMENT;  Surgeon: Billey Co, MD;  Location: [facility];  Service: Urology;  Laterality: Right;   FULGURATION OF BLADDER TUMOR N/A 06/28/2018   Procedure: CYSTOSCOPY BLADDER BIOPSY, FULGERATION OF BLADDER;  Surgeon: Billey Co, MD;  Location: [facility];  Service: Urology;  Laterality: N/A;   KNEE SURGERY Left    LAPAROSCOPIC NEPHRECTOMY, HAND ASSISTED Right 07/22/2018   Procedure: HAND ASSISTED LAPAROSCOPIC NEPHRECTOMY;  Surgeon: Billey Co, MD;  Location: [facility];  Service: Urology;  Laterality: Right;  NASAL SINUS SURGERY  03/15   POLYPECTOMY N/A 02/23/2017   Procedure: POLYPECTOMY;  Surgeon: [name], MD;  Location: [place];  Service: Endoscopy;  Laterality: N/A;   tooth abstraction     TRANSURETHRAL RESECTION OF BLADDER TUMOR N/A 05/12/2019   Procedure: TRANSURETHRAL RESECTION OF  BLADDER TUMOR (TURBT);  Surgeon: Billey Co, MD;  Location: [facility];  Service: Urology;  Laterality: N/A;   VARICOCELE EXCISION      SOCIAL HISTORY: Social History   Socioeconomic History   Marital status: Married    Spouse name: Alma Friendly   Number of children: Not on file   Years of education: Not on file   Highest education level: Not on file  Occupational History   Not on file  Tobacco Use   Smoking status: Former    Pack years: 0.00    Types: Cigarettes    Quit date: 06/15/2013    Years since quitting: 7.4   Smokeless tobacco: Never  Vaping Use   Vaping Use: Never used  Substance and Sexual Activity   Alcohol use: No    Alcohol/week: 0.0 standard drinks   Drug use: No   Sexual activity: Yes  Other Topics Concern   Not on file  Social History Narrative   Lives with wife, sister in law   Social Determinants of Health   Financial Resource Strain: Not on file  Food Insecurity: Not on file  Transportation Needs: Not on file  Physical Activity: Not on file  Stress: Not on file  Social Connections: Not on file  Intimate Partner Violence: Not on file    FAMILY HISTORY: Family History  Problem Relation Age of Onset   Heart disease Father    Hypertension Father    COPD Father    Colon cancer Maternal Grandmother    Heart attack Paternal Grandfather     ALLERGIES:  is allergic to glucotrol [glipizide].  MEDICATIONS:  Current Outpatient Medications  Medication Sig Dispense Refill   aspirin 81 MG EC tablet Take 81 mg by mouth daily with supper.     clobetasol ointment (TEMOVATE) 2.69 % Apply 1 application topically 2 (two) times daily as needed for rash.     cyclobenzaprine (FLEXERIL) 10 MG tablet Take 10 mg by mouth 3 (three) times daily as needed for muscle spasms.     Glucose Blood (ACCU-CHEK AVIVA PLUS VI) by In Vitro route.     JARDIANCE 10 MG TABS tablet TAKE 1 TABLET BY MOUTH DAILY (Patient taking differently: Take 10 mg by mouth daily with supper.) 30  tablet 2   lisinopril (PRINIVIL,ZESTRIL) 10 MG tablet TAKE 1 TABLET(10 MG) BY MOUTH DAILY (Patient taking differently: Take 10 mg by mouth daily with supper.) 90 tablet 1   metFORMIN (GLUCOPHAGE-XR) 500 MG 24 hr tablet Take 3 pills by mouth daily for 4-5 days, then increase to 4 pills daily (if tolerated) (Patient taking differently: Take 1,000 mg by mouth daily with supper.) 360 tablet 0   Multiple Vitamin (MULTIVITAMIN) tablet Take 1 tablet by mouth daily with supper.     omeprazole (PRILOSEC) 20 MG capsule Take 20 mg by mouth daily as needed (he[place]).     oxyCODONE (OXY IR/ROXICODONE) 5 MG immediate release tablet Take 5 mg by mouth every 6 (six) hours as needed for severe pain.     OZEMPIC, 0.25 OR 0.5 MG/DOSE, 2 MG/1.5ML SOPN Inject 1 mg into the skin every Sunday.     prochlorperazine (COMPAZINE) 10 MG tablet Take 1 tablet (  10 mg total) by mouth every 6 (six) hours as needed for nausea or vomiting. 15 tablet 0   rosuvastatin (CRESTOR) 20 MG tablet Take 20 mg by mouth daily with supper.     traZODone (DESYREL) 50 MG tablet Take 1 tablet (50 mg total) by mouth at bedtime. (Patient taking differently: Take 50 mg by mouth at bedtime as needed for sleep.) 30 tablet 0   No current facility-administered medications for this visit.     PHYSICAL EXAMINATION: ECOG PERFORMANCE STATUS: 1 - Symptomatic but completely ambulatory Vitals:   11/16/20 0856  BP: 133/88  Pulse: 89  Resp: 18  Temp: (!) 96.5 F (35.8 C)   Filed Weights   11/16/20 0856  Weight: 210 lb 9.6 oz (95.5 kg)    Physical Exam Constitutional:      General: He is not in acute distress.    Comments: Patient walks with a walker  HENT:     Head: Normocephalic and atraumatic.  Eyes:     General: No scleral icterus.    Pupils: Pupils are equal, round, and reactive to light.  Cardiovascular:     Rate and Rhythm: Normal rate and regular rhythm.     Heart sounds: Normal heart sounds.  Pulmonary:     Effort: Pulmonary  effort is normal. No respiratory distress.     Breath sounds: No wheezing.  Abdominal:     General: Bowel sounds are normal. There is no distension.     Palpations: Abdomen is soft. There is no mass.     Tenderness: There is no abdominal tenderness.     Comments: Right anterior abdominal wall bulging, worse with Valsalva and standing position.  Musculoskeletal:        General: No deformity. Normal range of motion.     Cervical back: Normal range of motion and neck supple.     Comments: Left knee status post knee replacement.   Skin:    General: Skin is warm and dry.     Findings: No erythema or rash.  Neurological:     Mental Status: He is alert and oriented to person, place, and time. Mental status is at baseline.     Cranial Nerves: No cranial nerve deficit.     Coordination: Coordination normal.  Psychiatric:        Mood and Affect: Mood normal.    LABORATORY DATA:  I have reviewed the data as listed Lab Results  Component Value Date   WBC 8.8 11/16/2020   HGB 14.2 11/16/2020   HCT 43.8 11/16/2020   MCV 88.5 11/16/2020   PLT 246 11/16/2020   Recent Labs    12/25/19 1031 12/25/19 1125 01/05/20 0840 01/12/20 1105 01/26/20 0829 02/12/20 1051 02/19/20 1115 02/27/20 1055 03/16/20 0830 10/05/20 0836 10/26/20 0859 11/16/20 0828  NA 134*  --  134*  --  135  --   --   --    < > 136 136 134*  K 4.7  --  4.4  --  4.6  --   --   --    < > 4.5 4.4 4.3  CL 104  --  101  --  102  --   --   --    < > 98 100 101  CO2 24  --  27  --  24  --   --   --    < > 26 28 27   GLUCOSE 98  --  94  --  96  --   --   --    < >  148* 135* 129*  BUN 24*  --  26*  --  23*  --   --   --    < > 24* 24* 21*  CREATININE 1.04  --  1.20  --  1.10  --   --   --    < > 1.10 0.95 0.86  CALCIUM 8.8*  --  9.1  --  9.2  --   --   --    < > 9.7 9.5 9.2  GFRNONAA >60  --  >60  --  >60  --   --   --    < > >60 >60 >60  GFRAA >60  --  >60  --  >60  --   --   --   --   --   --   --   PROT 6.8   < > 6.9   < >  6.6 6.7 6.8 6.9   < > 7.6 7.3 7.1  ALBUMIN 4.0   < > 4.2   < > 3.9 4.1 4.0 4.2   < > 4.2 4.1 4.1  AST 24   < > 23   < > 183* 130* 99* 67*   < > 20 22 21   ALT 22   < > 21   < > 336* 266* 203* 127*   < > 21 22 18   ALKPHOS 46   < > 50   < > 60 66 64 57   < > 76 73 66  BILITOT 0.8   < > 0.7   < > 0.9 1.2 1.1 1.0   < > 0.8 0.5 0.6  BILIDIR  --    < >  --    < >  --  0.3* 0.2 0.2  --   --   --   --   IBILI  --    < >  --    < >  --  0.9 0.9 0.8  --   --   --   --    < > = values in this interval not displayed.    Iron/TIBC/Ferritin/ %Sat    Component Value Date/Time   IRON 118 09/25/2019 1115   TIBC 454 (H) 09/25/2019 1115   FERRITIN 236 09/25/2019 1115   IRONPCTSAT 26 09/25/2019 1115      RADIOGRAPHIC STUDIES: I have personally reviewed the radiological images as listed and agreed with the findings in the report.  CT CHEST ABDOMEN PELVIS WO CONTRAST  Result Date: 09/30/2020 CLINICAL DATA:  Restaging metastatic renal cell carcinoma. EXAM: CT CHEST, ABDOMEN AND PELVIS WITHOUT CONTRAST TECHNIQUE: Multidetector CT imaging of the chest, abdomen and pelvis was performed following the standard protocol without IV contrast. COMPARISON:  Multiple priors including CT chest abdomen pelvis May 11, 2020 FINDINGS: CT CHEST FINDINGS Cardiovascular: Aortic atherosclerosis without aneurysmal dilation. Coronary artery calcifications. Normal size heart. No significant pericardial effusion/thickening. Mediastinum/Nodes: Thyroid unremarkable. No pathologically enlarged mediastinal, hilar or axillary lymph nodes visualized within the limitations of noncontrast examination. Trachea is unremarkable. Lungs/Pleura: No significant change in size of the scattered small pulmonary nodules. Index nodule within the posterolateral left upper lobe on image 46/3 measures 4 mm, unchanged. No new suspicious pulmonary nodule. Scattered areas of subsegmental atelectasis in the left lung base lingula and right middle lobe appears  similar prior. No pleural effusion. No pneumothorax. Musculoskeletal: No chest wall mass or suspicious bone lesions identified. CT ABDOMEN PELVIS FINDINGS Hepatobiliary: Hepatic parenchyma is grossly unremarkable in noncontrast appearance.  Gallbladder is grossly unremarkable. No biliary ductal dilation. Pancreas: Unremarkable. No pancreatic ductal dilatation or surrounding inflammatory changes. Spleen: Normal in size without focal abnormality. Adrenals/Urinary Tract: Bilateral adrenal glands unremarkable. Prior right nephrectomy without new suspicious soft tissue nodularity in the nephrectomy bed, to suggest local recurrence. Left kidney demonstrates no hydronephrosis or renal stones. Hypodense 1.2 cm cortical lesion in the interpolar region of the left kidney on image 72/2, consistent with a cyst. Stomach/Bowel: Stomach is grossly unremarkable. Enteric contrast traverses the terminal ileum. No pathologic dilation of small bowel. Appendix is grossly unremarkable. No suspicious colonic wall thickening or masslike lesions. Vascular/Lymphatic: Aortic atherosclerosis without aneurysmal dilation. No pathologically enlarged abdominal or pelvic lymph nodes. Reproductive: Prostate is unremarkable. Other: No abdominopelvic ascites. Musculoskeletal: No acute or significant osseous findings. IMPRESSION: 1. Stable examination without evidence of local recurrence or new/progressive metastatic disease in the chest abdomen or pelvis. 2. Tiny nonspecific pulmonary nodules are unchanged from prior examination. No new suspicious pulmonary nodule. 3. Aortic atherosclerosis. Aortic Atherosclerosis (ICD10-I70.0). Electronically Signed   By: Dahlia Bailiff MD   On: 09/30/2020 19:14       ASSESSMENT & PLAN:  1. Clear cell carcinoma of right kidney ([place])   2. Encounter for antineoplastic immunotherapy   3. 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 Labs  reviewed and discussed with patient.  Proceed with Keytruda treatment. Next CT surveillance will be obtained in August 2022  Recent history of knee replacement.  Follow-up with orthopedic surgeon.  Continue aspirin 81 mg for DVT prophylaxis. Abdominal wall hernia, patient planned to have elective hernia surgery later this week.  Follow-up in 3 weeks We spent sufficient time to discuss many aspect of care, questions were answered to patient's satisfaction. All questions were answered. The patient knows to call the clinic with any problems questions or concerns.  Earlie Server, MD, PhD Hematology Oncology [facility] Pager- [phone] 11/16/2020

## 2020-11-18 ENCOUNTER — Ambulatory Visit
Admission: RE | Admit: 2020-11-18 | Discharge: 2020-11-18 | Disposition: A | Discharge status: Home / Self Care | Payer: BC Managed Care – PPO | Attending: Surgery | Admitting: Surgery

## 2020-11-18 ENCOUNTER — Encounter: Payer: Self-pay | Admitting: Surgery

## 2020-11-18 ENCOUNTER — Ambulatory Visit: Payer: BC Managed Care – PPO | Admitting: Certified Registered"

## 2020-11-18 ENCOUNTER — Encounter
Admission: RE | Disposition: A | Discharge status: Home / Self Care | Payer: Self-pay | Source: Home / Self Care | Attending: Surgery

## 2020-11-18 ENCOUNTER — Other Ambulatory Visit: Payer: Self-pay

## 2020-11-18 DIAGNOSIS — K432 Incisional hernia without obstruction or gangrene: Secondary | ICD-10-CM | POA: Diagnosis not present

## 2020-11-18 DIAGNOSIS — Z85528 Personal history of other malignant neoplasm of kidney: Secondary | ICD-10-CM | POA: Insufficient documentation

## 2020-11-18 DIAGNOSIS — Z96653 Presence of artificial knee joint, bilateral: Secondary | ICD-10-CM | POA: Insufficient documentation

## 2020-11-18 DIAGNOSIS — Z87891 Personal history of nicotine dependence: Secondary | ICD-10-CM | POA: Insufficient documentation

## 2020-11-18 DIAGNOSIS — Z905 Acquired absence of kidney: Secondary | ICD-10-CM | POA: Insufficient documentation

## 2020-11-18 HISTORY — PX: XI ROBOTIC ASSISTED VENTRAL HERNIA: SHX6789

## 2020-11-18 LAB — GLUCOSE, CAPILLARY
Glucose-Capillary: 148 mg/dL — ABNORMAL HIGH (ref 70–99)
Glucose-Capillary: 253 mg/dL — ABNORMAL HIGH (ref 70–99)

## 2020-11-18 SURGERY — REPAIR, HERNIA, VENTRAL, ROBOT-ASSISTED
Anesthesia: General

## 2020-11-18 MED ORDER — DEXAMETHASONE SODIUM PHOSPHATE 10 MG/ML IJ SOLN
INTRAMUSCULAR | Status: DC | PRN
  Administered 2020-11-18: 5 mg via INTRAVENOUS

## 2020-11-18 MED ORDER — BUPIVACAINE-EPINEPHRINE 0.25% -1:200000 IJ SOLN
INTRAMUSCULAR | Status: DC | PRN
  Administered 2020-11-18: 30 mL

## 2020-11-18 MED ORDER — BUPIVACAINE LIPOSOME 1.3 % IJ SUSP
INTRAMUSCULAR | Status: DC | PRN
  Administered 2020-11-18: 20 mL

## 2020-11-18 MED ORDER — GABAPENTIN 300 MG PO CAPS
ORAL_CAPSULE | ORAL | Status: AC
  Filled 2020-11-18: qty 1

## 2020-11-18 MED ORDER — ACETAMINOPHEN 500 MG PO TABS
ORAL_TABLET | ORAL | Status: AC
  Filled 2020-11-18: qty 2

## 2020-11-18 MED ORDER — SUGAMMADEX SODIUM 200 MG/2ML IV SOLN
INTRAVENOUS | Status: DC | PRN
  Administered 2020-11-18: 200 mg via INTRAVENOUS

## 2020-11-18 MED ORDER — LIDOCAINE HCL (CARDIAC) PF 100 MG/5ML IV SOSY
PREFILLED_SYRINGE | INTRAVENOUS | Status: DC | PRN
  Administered 2020-11-18: 50 mg via INTRAVENOUS
  Administered 2020-11-18: 10 mg via INTRAVENOUS
  Administered 2020-11-18: 20 mg via INTRAVENOUS

## 2020-11-18 MED ORDER — DEXMEDETOMIDINE (PRECEDEX) IN NS 20 MCG/5ML (4 MCG/ML) IV SYRINGE
PREFILLED_SYRINGE | INTRAVENOUS | Status: DC | PRN
  Administered 2020-11-18 (×2): 8 ug via INTRAVENOUS

## 2020-11-18 MED ORDER — FENTANYL CITRATE (PF) 100 MCG/2ML IJ SOLN
INTRAMUSCULAR | Status: AC
  Filled 2020-11-18: qty 2

## 2020-11-18 MED ORDER — ACETAMINOPHEN 10 MG/ML IV SOLN
INTRAVENOUS | Status: AC
  Filled 2020-11-18: qty 100

## 2020-11-18 MED ORDER — FENTANYL CITRATE (PF) 100 MCG/2ML IJ SOLN
25.0000 ug | INTRAMUSCULAR | Status: DC | PRN
  Administered 2020-11-18 (×2): 50 ug via INTRAVENOUS
  Administered 2020-11-18: 25 ug via INTRAVENOUS

## 2020-11-18 MED ORDER — CHLORHEXIDINE GLUCONATE 0.12 % MT SOLN
OROMUCOSAL | Status: AC
  Filled 2020-11-18: qty 15

## 2020-11-18 MED ORDER — PROPOFOL 10 MG/ML IV BOLUS
INTRAVENOUS | Status: DC | PRN
  Administered 2020-11-18: 150 mg via INTRAVENOUS
  Administered 2020-11-18: 50 mg via INTRAVENOUS

## 2020-11-18 MED ORDER — ACETAMINOPHEN 500 MG PO TABS
1000.0000 mg | ORAL_TABLET | ORAL | Status: AC
  Administered 2020-11-18: 1000 mg via ORAL

## 2020-11-18 MED ORDER — ROCURONIUM BROMIDE 100 MG/10ML IV SOLN
INTRAVENOUS | Status: DC | PRN
  Administered 2020-11-18 (×2): 50 mg via INTRAVENOUS

## 2020-11-18 MED ORDER — KETAMINE HCL 10 MG/ML IJ SOLN
INTRAMUSCULAR | Status: DC | PRN
  Administered 2020-11-18: 20 mg via INTRAVENOUS
  Administered 2020-11-18: 10 mg via INTRAVENOUS

## 2020-11-18 MED ORDER — FENTANYL CITRATE (PF) 100 MCG/2ML IJ SOLN
INTRAMUSCULAR | Status: AC
  Administered 2020-11-18: 25 ug via INTRAVENOUS
  Filled 2020-11-18: qty 2

## 2020-11-18 MED ORDER — OXYCODONE HCL 5 MG PO TABS: 5.0000 mg | ORAL_TABLET | Freq: Once | ORAL | Status: AC | PRN

## 2020-11-18 MED ORDER — CHLORHEXIDINE GLUCONATE CLOTH 2 % EX PADS: 6.0000 | MEDICATED_PAD | Freq: Once | CUTANEOUS | Status: DC

## 2020-11-18 MED ORDER — CEFAZOLIN SODIUM-DEXTROSE 2-4 GM/100ML-% IV SOLN
INTRAVENOUS | Status: AC
  Filled 2020-11-18: qty 100

## 2020-11-18 MED ORDER — SEVOFLURANE IN SOLN
RESPIRATORY_TRACT | Status: AC
  Filled 2020-11-18: qty 500

## 2020-11-18 MED ORDER — CELECOXIB 200 MG PO CAPS
200.0000 mg | ORAL_CAPSULE | ORAL | Status: AC
Start: 2020-11-18 — End: 2020-11-18
  Administered 2020-11-18: 200 mg via ORAL

## 2020-11-18 MED ORDER — BUPIVACAINE LIPOSOME 1.3 % IJ SUSP
INTRAMUSCULAR | Status: AC
  Filled 2020-11-18: qty 20

## 2020-11-18 MED ORDER — HYDROCODONE-ACETAMINOPHEN 5-325 MG PO TABS: 1.0000 | ORAL_TABLET | ORAL | 0 refills | Status: DC | PRN

## 2020-11-18 MED ORDER — ORAL CARE MOUTH RINSE: 15.0000 mL | Freq: Once | OROMUCOSAL | Status: AC

## 2020-11-18 MED ORDER — BUPIVACAINE-EPINEPHRINE (PF) 0.25% -1:200000 IJ SOLN
INTRAMUSCULAR | Status: AC
  Filled 2020-11-18: qty 30

## 2020-11-18 MED ORDER — ONDANSETRON HCL 4 MG/2ML IJ SOLN
INTRAMUSCULAR | Status: DC | PRN
  Administered 2020-11-18: 4 mg via INTRAVENOUS

## 2020-11-18 MED ORDER — SODIUM CHLORIDE 0.9 % IV SOLN: INTRAVENOUS | Status: DC

## 2020-11-18 MED ORDER — OXYCODONE HCL 5 MG/5ML PO SOLN: 5.0000 mg | Freq: Once | ORAL | Status: AC | PRN

## 2020-11-18 MED ORDER — OXYCODONE HCL 5 MG PO TABS
ORAL_TABLET | ORAL | Status: AC
  Administered 2020-11-18: 5 mg via ORAL
  Filled 2020-11-18: qty 1

## 2020-11-18 MED ORDER — KETAMINE HCL 50 MG/5ML IJ SOSY
PREFILLED_SYRINGE | INTRAMUSCULAR | Status: AC
  Filled 2020-11-18: qty 5

## 2020-11-18 MED ORDER — GABAPENTIN 300 MG PO CAPS
300.0000 mg | ORAL_CAPSULE | ORAL | Status: AC
  Administered 2020-11-18: 300 mg via ORAL

## 2020-11-18 MED ORDER — MIDAZOLAM HCL 2 MG/2ML IJ SOLN
INTRAMUSCULAR | Status: AC
  Filled 2020-11-18: qty 2

## 2020-11-18 MED ORDER — MIDAZOLAM HCL 2 MG/2ML IJ SOLN
INTRAMUSCULAR | Status: DC | PRN
  Administered 2020-11-18: 2 mg via INTRAVENOUS

## 2020-11-18 MED ORDER — PROPOFOL 10 MG/ML IV BOLUS
INTRAVENOUS | Status: AC
  Filled 2020-11-18: qty 20

## 2020-11-18 MED ORDER — CELECOXIB 200 MG PO CAPS
ORAL_CAPSULE | ORAL | Status: AC
  Filled 2020-11-18: qty 1

## 2020-11-18 MED ORDER — CHLORHEXIDINE GLUCONATE 0.12 % MT SOLN
15.0000 mL | Freq: Once | OROMUCOSAL | Status: AC
  Administered 2020-11-18: 15 mL via OROMUCOSAL

## 2020-11-18 MED ORDER — CEFAZOLIN SODIUM-DEXTROSE 2-4 GM/100ML-% IV SOLN
2.0000 g | INTRAVENOUS | Status: AC
  Administered 2020-11-18: 2 g via INTRAVENOUS

## 2020-11-18 MED ORDER — FENTANYL CITRATE (PF) 100 MCG/2ML IJ SOLN
INTRAMUSCULAR | Status: DC | PRN
  Administered 2020-11-18: 100 ug via INTRAVENOUS

## 2020-11-18 SURGICAL SUPPLY — 57 items
ADH SKN CLS APL DERMABOND .7 (GAUZE/BANDAGES/DRESSINGS) ×1
APL PRP STRL LF DISP 70% ISPRP (MISCELLANEOUS) ×1
CANISTER SUCT 1200ML W/VALVE (MISCELLANEOUS) ×1 IMPLANT
CANNULA REDUC XI 12-8 STAPL (CANNULA) ×2
CANNULA REDUCER 12-8 DVNC XI (CANNULA) ×1 IMPLANT
CHLORAPREP W/TINT 26 (MISCELLANEOUS) ×2 IMPLANT
COVER TIP SHEARS 8 DVNC (MISCELLANEOUS) ×1 IMPLANT
COVER TIP SHEARS 8MM DA VINCI (MISCELLANEOUS) ×2
DEFOGGER SCOPE WARMER CLEARIFY (MISCELLANEOUS) ×2 IMPLANT
DERMABOND ADVANCED (GAUZE/BANDAGES/DRESSINGS) ×1
DERMABOND ADVANCED .7 DNX12 (GAUZE/BANDAGES/DRESSINGS) ×1 IMPLANT
DRAPE 3/4 80X56 (DRAPES) ×2 IMPLANT
DRAPE ARM DVNC X/XI (DISPOSABLE) ×4 IMPLANT
DRAPE COLUMN DVNC XI (DISPOSABLE) ×1 IMPLANT
DRAPE DA VINCI XI ARM (DISPOSABLE) ×8
DRAPE DA VINCI XI COLUMN (DISPOSABLE) ×2
ELECT CAUTERY BLADE 6.4 (BLADE) ×2 IMPLANT
ELECT REM PT RETURN 9FT ADLT (ELECTROSURGICAL) ×2
ELECTRODE REM PT RTRN 9FT ADLT (ELECTROSURGICAL) ×1 IMPLANT
GAUZE 4X4 16PLY ~~LOC~~+RFID DBL (SPONGE) ×2 IMPLANT
GLOVE SURG ENC MOIS LTX SZ7 (GLOVE) ×4 IMPLANT
GOWN STRL REUS W/ TWL LRG LVL3 (GOWN DISPOSABLE) ×3 IMPLANT
GOWN STRL REUS W/TWL LRG LVL3 (GOWN DISPOSABLE) ×8
GRASPER SUT TROCAR 14GX15 (MISCELLANEOUS) ×2 IMPLANT
IRRIGATION STRYKERFLOW (MISCELLANEOUS) IMPLANT
IRRIGATOR STRYKERFLOW (MISCELLANEOUS)
IV NS 1000ML (IV SOLUTION)
IV NS 1000ML BAXH (IV SOLUTION) IMPLANT
KIT PINK PAD W/HEAD ARE REST (MISCELLANEOUS) ×2
KIT PINK PAD W/HEAD ARM REST (MISCELLANEOUS) ×1 IMPLANT
LABEL OR SOLS (LABEL) ×2 IMPLANT
MANIFOLD NEPTUNE II (INSTRUMENTS) ×2 IMPLANT
MESH VENTRALIGHT ST 4X6IN (Mesh General) ×1 IMPLANT
NDL INSUFFLATION 14GA 120MM (NEEDLE) IMPLANT
NEEDLE HYPO 22GX1.5 SAFETY (NEEDLE) ×2 IMPLANT
NEEDLE INSUFFLATION 14GA 120MM (NEEDLE) ×2 IMPLANT
OBTURATOR OPTICAL STANDARD 8MM (TROCAR) ×2
OBTURATOR OPTICAL STND 8 DVNC (TROCAR) ×1
OBTURATOR OPTICALSTD 8 DVNC (TROCAR) ×1 IMPLANT
PACK LAP CHOLECYSTECTOMY (MISCELLANEOUS) ×2 IMPLANT
PENCIL ELECTRO HAND CTR (MISCELLANEOUS) ×2 IMPLANT
SEAL CANN UNIV 5-8 DVNC XI (MISCELLANEOUS) ×3 IMPLANT
SEAL XI 5MM-8MM UNIVERSAL (MISCELLANEOUS) ×6
SET TUBE SMOKE EVAC HIGH FLOW (TUBING) ×2 IMPLANT
SOLUTION ELECTROLUBE (MISCELLANEOUS) ×2 IMPLANT
SPONGE T-LAP 18X18 ~~LOC~~+RFID (SPONGE) ×2 IMPLANT
STAPLER CANNULA SEAL DVNC XI (STAPLE) ×1 IMPLANT
STAPLER CANNULA SEAL XI (STAPLE) ×2
SUT MNCRL 4-0 (SUTURE) ×2
SUT MNCRL 4-0 27XMFL (SUTURE) ×1
SUT STRATAFIX PDS 30 CT-1 (SUTURE) ×2 IMPLANT
SUT VICRYL 0 AB UR-6 (SUTURE) ×4 IMPLANT
SUT VLOC 90 2/L VL 12 GS22 (SUTURE) ×6 IMPLANT
SUTURE MNCRL 4-0 27XMF (SUTURE) ×1 IMPLANT
SYR 20ML LL LF (SYRINGE) ×2 IMPLANT
TAPE TRANSPORE STRL 2 31045 (GAUZE/BANDAGES/DRESSINGS) ×2 IMPLANT
TROCAR BALLN GELPORT 12X130M (ENDOMECHANICALS) ×2 IMPLANT

## 2020-11-18 NOTE — Op Note (Signed)
Robotic assisted laparoscopic Incisional hernia Repair IPOM using  Oval ventralight BARD mesh 4x6 inches   Pre-operative Diagnosis: Incisional hernia   Post-operative Diagnosis: same   Surgeon: Caroleen Hamman, MD FACS   Anesthesia: Gen. with endotracheal tube     Findings: 5 length transversely x 3 cms incisional hernia Right lower quadrant.   Estimated Blood Loss: 5cc         Complications: none             Procedure Details  The patient was seen again in the Holding Room. The benefits, complications, treatment options, and expected outcomes were discussed with the patient. The risks of bleeding, infection, recurrence of symptoms, failure to resolve symptoms, bowel injury, mesh placement, mesh infection, any of which could require further surgery were reviewed with the patient. The likelihood of improving the patient's symptoms with return to their baseline status is good.  The patient and/or family concurred with the proposed plan, giving informed consent.  The patient was taken to Operating Room, identified and the procedure verified.  A Time Out was held and the above information confirmed.   Prior to the induction of general anesthesia, antibiotic prophylaxis was administered. VTE prophylaxis was in place. General endotracheal anesthesia was then administered and tolerated well. After the induction, the abdomen was prepped with Chloraprep and draped in the sterile fashion. The patient was positioned in the supine position.   I used a left upper quadrant incision and performed an optiview to entered the abdominal cavity under direct visualization. Pneumoperitoneum obtained w/o hemodynamic changes. No evidence of bowel injuries observed.   2 additional 8 mm ports were placed under direct visualization.  I visualized the hernia and there was an incisional hernia measuring 5 cm cm.  At this time I went ahead and inserted the mesh.   The robot was brought to the surgical field and docked in  the standard fashion.  We made sure that all instrumentation was kept under direct vision at all times and there was no collision between the arms.  I scrubbed out and went to the console. There was omentum within the hernia defect and this was taken down with scissors in the standard fashion.   Using a 0 stratafix suture we closed the ventral defect primarily.  The PMI was used to pierce the defect in the center.  Using the mesh and the echo location device we were able to bring the mesh towards the abdominal wall. Falciform was taken down with electrocautery to allow adequate mesh placement.   The mesh was secured circumferentially to the abdominal wall using 2 OV lock in the standard fashion.   The mesh layed really nicely against the  abdominal wall. A second look laparoscopy revealed no evidence of intra-abdominal injury.    All the needles and foreign objects were removed under direct visualization.  The instruments were removed and the robot was undocked.  I scrubbed back in, The laparoscopic ports were removed under direct visualization and the pneumoperitoneum was deflated.   Incisions were closed with  4-0 Monocryl .  Dermabond was used to coat the skin. Liposomal Marcaine  was used to inject all the incision sites. Patient tolerated procedure well and there were no immediate complications. Needle and laparotomy counts were correct    Caroleen Hamman, MD, FACS

## 2020-11-18 NOTE — Anesthesia Procedure Notes (Signed)
Procedure Name: Intubation Date/Time: 11/18/2020 7:54 AM Performed by: Biagio Borg, CRNA Pre-anesthesia Checklist: Patient identified, Emergency Drugs available, Suction available and Patient being monitored Patient Re-evaluated:Patient Re-evaluated prior to induction Oxygen Delivery Method: Circle system utilized Preoxygenation: Pre-oxygenation with 100% oxygen Induction Type: IV induction Ventilation: Mask ventilation without difficulty Laryngoscope Size: McGraph and 4 Grade View: Grade I Tube type: Oral Number of attempts: 1 Airway Equipment and Method: Stylet Placement Confirmation: ETT inserted through vocal cords under direct vision, positive ETCO2 and breath sounds checked- equal and bilateral Secured at: 22 cm Tube secured with: Tape Dental Injury: Teeth and Oropharynx as per pre-operative assessment

## 2020-11-18 NOTE — Discharge Instructions (Signed)
AMBULATORY SURGERY  DISCHARGE INSTRUCTIONS   The drugs that you were given will stay in your system until tomorrow so for the next 24 hours you should not:  Drive an automobile Make any legal decisions Drink any alcoholic beverage   You may resume regular meals tomorrow.  Today it is better to start with liquids and gradually work up to solid foods.  You may eat anything you prefer, but it is better to start with liquids, then soup and crackers, and gradually work up to solid foods.   Please notify your doctor immediately if you have any unusual bleeding, trouble breathing, redness and pain at the surgery site, drainage, fever, or pain not relieved by medication.    Your post-operative visit with Dr.                                       is: Date:                        Time:    Please call to schedule your post-operative visit.  Additional Instructions:   PLEASE LEAVE GREEN ARMBAND ON FOR 4 DAYS

## 2020-11-18 NOTE — Anesthesia Preprocedure Evaluation (Signed)
Anesthesia Evaluation  Patient identified by MRN, date of birth, ID band Patient awake    Reviewed: Allergy & Precautions, NPO status , Patient's Chart, lab work & pertinent test results  History of Anesthesia Complications Negative for: history of anesthetic complications  Airway Mallampati: III  TM Distance: >3 FB Neck ROM: full    Dental  (+) Chipped, Poor Dentition, Missing   Pulmonary former smoker,    Pulmonary exam normal        Cardiovascular Exercise Tolerance: Good hypertension, (-) angina(-) Past MI and (-) DOE Normal cardiovascular exam     Neuro/Psych  Headaches, negative psych ROS   GI/Hepatic Neg liver ROS, GERD  Medicated and Controlled,  Endo/Other  diabetes, Type 2, Insulin Dependent  Renal/GU Renal disease     Musculoskeletal  (+) Arthritis ,   Abdominal   Peds  Hematology negative hematology ROS (+)   Anesthesia Other Findings Past Medical History: No date: Arthritis     Comment:  hands, left knee No date: Cancer ([place]) No date: Diabetes mellitus without complication ([place]) 01/06/2352: Eczema No date: GERD (gastroesophageal reflux disease) No date: History of kidney cancer 11/02/2014: Hyperlipidemia LDL goal <100 No date: Hypertension No date: Kidney stone     Comment:  YEAR H/O HEMATURIA No date: Psoriasis No date: Wears dentures     Comment:  full upper, partial lower.  Has, does not wear  Past Surgical History: 02/23/2017: COLONOSCOPY WITH PROPOFOL; N/A     Comment:  Procedure: COLONOSCOPY WITH PROPOFOL;  Surgeon: [name], MD;  Location: [place];  Service:               Endoscopy;  Laterality: N/A;  Diabetic - oral meds Aug. 2016: CYST EXCISION     Comment:  on back 06/28/2018: CYSTOSCOPY W/ RETROGRADES; Right     Comment:  Procedure: CYSTOSCOPY WITH RETROGRADE PYELOGRAM;                Surgeon: Billey Co, MD;  Location: [facility];                 Service: Urology;  Laterality: Right; 07/22/2018: CYSTOSCOPY W/ URETERAL STENT REMOVAL; Right     Comment:  Procedure: CYSTOSCOPY WITH STENT REMOVAL;  Surgeon:               Billey Co, MD;  Location: [facility];  Service:               Urology;  Laterality: Right; 06/28/2018: CYSTOSCOPY WITH BIOPSY; Right     Comment:  Procedure: CYSTOSCOPY WITH RENAL BIOPSY;  Surgeon:               Billey Co, MD;  Location: [facility];  Service:               Urology;  Laterality: Right; 06/28/2018: CYSTOSCOPY WITH URETEROSCOPY AND STENT PLACEMENT; Right     Comment:  Procedure: CYSTOSCOPY WITH URETEROSCOPY AND STENT               PLACEMENT;  Surgeon: Billey Co, MD;  Location:               [facility];  Service: Urology;  Laterality: Right; 06/28/2018: FULGURATION OF BLADDER TUMOR; N/A     Comment:  Procedure: CYSTOSCOPY BLADDER BIOPSY, FULGERATION OF  BLADDER;  Surgeon: Billey Co, MD;  Location: [facility];  Service: Urology;  Laterality: N/A; No date: KNEE SURGERY; Left 07/22/2018: LAPAROSCOPIC NEPHRECTOMY, HAND ASSISTED; Right     Comment:  Procedure: HAND ASSISTED LAPAROSCOPIC NEPHRECTOMY;                Surgeon: Billey Co, MD;  Location: [facility];                Service: Urology;  Laterality: Right; 03/15: NASAL SINUS SURGERY 02/23/2017: POLYPECTOMY; N/A     Comment:  Procedure: POLYPECTOMY;  Surgeon: [name], MD;                Location: [place];  Service: Endoscopy;                Laterality: N/A; No date: tooth abstraction 05/12/2019: TRANSURETHRAL RESECTION OF BLADDER TUMOR; N/A     Comment:  Procedure: TRANSURETHRAL RESECTION OF BLADDER TUMOR               (TURBT);  Surgeon: Billey Co, MD;  Location: [facility];  Service: Urology;  Laterality: N/A; No date: VARICOCELE EXCISION  BMI    Body Mass Index: 28.48 kg/m      Reproductive/Obstetrics negative OB ROS                              Anesthesia Physical Anesthesia Plan  ASA: 3  Anesthesia Plan: General ETT   Post-op Pain Management:    Induction: Intravenous  PONV Risk Score and Plan: Ondansetron, Dexamethasone, Midazolam and Treatment may vary due to age or medical condition  Airway Management Planned: Oral ETT  Additional Equipment:   Intra-op Plan:   Post-operative Plan: Extubation in OR  Informed Consent: I have reviewed the patients History and Physical, chart, labs and discussed the procedure including the risks, benefits and alternatives for the proposed anesthesia with the patient or authorized representative who has indicated his/her understanding and acceptance.     Dental Advisory Given  Plan Discussed with: Anesthesiologist, CRNA and Surgeon  Anesthesia Plan Comments: (Patient consented for risks of anesthesia including but not limited to:  - adverse reactions to medications - damage to eyes, teeth, lips or other oral mucosa - nerve damage due to positioning  - sore throat or hoarseness - Damage to heart, brain, nerves, lungs, other parts of body or loss of life  Patient voiced understanding.)        Anesthesia Quick Evaluation

## 2020-11-18 NOTE — Anesthesia Postprocedure Evaluation (Signed)
Anesthesia Post Note  Patient: Matthew Woodard  Procedure(s) Performed: XI ROBOTIC ASSISTED VENTRAL HERNIA, incisional  Anesthesia Type: General Anesthetic complications: no   No notable events documented.   Last Vitals:  Vitals:   11/18/20 1103 11/18/20 1139  BP: (!) 166/79 137/64  Pulse: 76 80  Resp: 15 14  Temp: (!) 36.2 C (!) 36.1 C  SpO2: 98% 97%    Last Pain:  Vitals:   11/18/20 1139  TempSrc:   PainSc: 4                  Precious Haws Delenn Ahn

## 2020-11-18 NOTE — H&P (Signed)
Chief Complaint  Patient presents with   Follow-up      Abdominal wall hernia      HPI: Matthew Woodard is a 55 y.o. male following for right incisional hernia.  He did have a history of renal cell carcinoma and is status post hand-assisted right nephrectomy performed Dr. Diamantina Providence 2020.  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  He has been started on Keytruda which she has tolerated well.  He does have significant bilateral knee pain and is scheduled to have elective knee replacement on the left side within a few weeks. He reports a bulge in the right lower quadrant that has been present for the last year or so.  Some discomfort and the bulge seems to be larger when lifting heavy objects.  He denies any fevers any chills no evidence of incarceration or strangulation. He is able to perform more than 4 METS of activity without any shortness of breath or chest pain.  He did have a CT of the chest abdomen pelvis that I personally reviewed showing evidence of bilateral pulmonary nodules and evidence of right lower quadrant incisional hernia. He also had a left knee replacement and did very well.  Now he is doing physical therapy and he is walking.  He is ready to have his hernia fixed             Past Medical History:  Diagnosis Date   Arthritis      hands, left knee   Cancer ([place])     Diabetes mellitus without complication ([place])     Eczema 01/08/2017   GERD (gastroesophageal reflux disease)     History of kidney cancer     Hyperlipidemia LDL goal <100 11/02/2014   Hypertension     Kidney stone      YEAR H/O HEMATURIA   Psoriasis     Wears dentures      full upper, partial lower.  Has, does not wear           Past Surgical History:  Procedure Laterality Date   COLONOSCOPY WITH PROPOFOL N/A 02/23/2017    Procedure: COLONOSCOPY WITH PROPOFOL;  Surgeon: [name], MD;  Location:  [place];  Service: Endoscopy;  Laterality: N/A;  Diabetic - oral meds   CYST EXCISION   Aug. 2016    on back   CYSTOSCOPY W/ RETROGRADES Right 06/28/2018    Procedure: CYSTOSCOPY WITH RETROGRADE PYELOGRAM;  Surgeon: Billey Co, MD;  Location: [facility];  Service: Urology;  Laterality: Right;   CYSTOSCOPY W/ URETERAL STENT REMOVAL Right 07/22/2018    Procedure: CYSTOSCOPY WITH STENT REMOVAL;  Surgeon: Billey Co, MD;  Location: [facility];  Service: Urology;  Laterality: Right;   CYSTOSCOPY WITH BIOPSY Right 06/28/2018    Procedure: CYSTOSCOPY WITH RENAL BIOPSY;  Surgeon: Billey Co, MD;  Location: [facility];  Service: Urology;  Laterality: Right;   CYSTOSCOPY WITH URETEROSCOPY AND STENT PLACEMENT Right 06/28/2018    Procedure: CYSTOSCOPY WITH URETEROSCOPY AND STENT PLACEMENT;  Surgeon: Billey Co, MD;  Location: [facility];  Service: Urology;  Laterality: Right;   FULGURATION OF BLADDER TUMOR N/A 06/28/2018    Procedure: CYSTOSCOPY BLADDER BIOPSY, FULGERATION OF BLADDER;  Surgeon: Billey Co, MD;  Location: [facility];  Service: Urology;  Laterality: N/A;   KNEE SURGERY Left     LAPAROSCOPIC NEPHRECTOMY, HAND ASSISTED Right 07/22/2018  Procedure: HAND ASSISTED LAPAROSCOPIC NEPHRECTOMY;  Surgeon: Billey Co, MD;  Location: [facility];  Service: Urology;  Laterality: Right;   NASAL SINUS SURGERY   03/15   POLYPECTOMY N/A 02/23/2017    Procedure: POLYPECTOMY;  Surgeon: [name], MD;  Location: [place];  Service: Endoscopy;  Laterality: N/A;   tooth abstraction       TRANSURETHRAL RESECTION OF BLADDER TUMOR N/A 05/12/2019    Procedure: TRANSURETHRAL RESECTION OF BLADDER TUMOR (TURBT);  Surgeon: Billey Co, MD;  Location: [facility];  Service: Urology;  Laterality: N/A;   VARICOCELE EXCISION               Family History  Problem Relation Age of Onset   Heart disease Father     Hypertension Father     COPD Father     Colon cancer Maternal  Grandmother     Heart attack Paternal Grandfather        Social History:  reports that he quit smoking about 7 years ago. His smoking use included cigarettes. He has never used smokeless tobacco. He reports that he does not drink alcohol and does not use drugs.   Allergies:       Allergies  Allergen Reactions   Glucotrol [Glipizide] Other (See Comments)      Glucose level spiked then dropped      Medications reviewed.       ROS Full ROS performed and is otherwise negative other than what is stated in HPI      Physical Exam Vitals and nursing note reviewed. Exam conducted with a chaperone present.  Constitutional:      General: He is not in acute distress.    Appearance: Normal appearance. He is normal weight. He is not toxic-appearing.  Eyes:    General: No scleral icterus.       Right eye: No discharge.        Left eye: No discharge.  Cardiovascular:    Rate and Rhythm: Normal rate and regular rhythm.     Heart sounds: No murmur heard.    Pulmonary:    Effort: Pulmonary effort is normal. No respiratory distress.     Breath sounds: Normal breath sounds. No stridor. No wheezing or rhonchi.  Abdominal:     General: Abdomen is flat. There is no distension.     Palpations: Abdomen is soft. There is no mass.     Tenderness: There is no abdominal tenderness. There is no guarding or rebound.     Hernia: A hernia is present.     Comments: 6 cm incisional hernia located in the right lower quadrant.  Reducible.  No peritonitis  Musculoskeletal:        General: No swelling. Normal range of motion.     Cervical back: Normal range of motion and neck supple. Skin:    General: Skin is warm and dry.     Capillary Refill: Capillary refill takes less than 2 seconds.  Neurological:    General: No focal deficit present.     Mental Status: He is alert and oriented to person, place, and time.  Psychiatric:        Mood and Affect: Mood normal.        Behavior: Behavior normal.         Thought Content: Thought content normal.        Judgment: Judgment normal.             Assessment/Plan: 55 year old male with mildly  symptomatic incisional hernia.  Definitely recommend repair. I do think that he will be a good candidate for robotic approach.  Procedure discussed with patient in detail.  Risks, benefits and possible complications including but not limited to: Bleeding, infection, recurrence, chronic pain, bowel injuries.  He understands and wished to proceed All questions were answered.     Caroleen Hamman, MD [facility] General Surgeon

## 2020-11-18 NOTE — Transfer of Care (Signed)
Immediate Anesthesia Transfer of Care Note  Patient: Matthew Woodard  Procedure(s) Performed: XI ROBOTIC ASSISTED VENTRAL HERNIA, incisional  Patient Location: PACU  Anesthesia Type:General  Level of Consciousness: drowsy  Airway & Oxygen Therapy: Patient Spontanous Breathing and Patient connected to face mask oxygen  Post-op Assessment: Report given to RN and Post -op Vital signs reviewed and stable  Post vital signs: Reviewed and stable  Last Vitals:  Vitals Value Taken Time  BP 148/75 11/18/20 0958  Temp 36.3 C 11/18/20 0958  Pulse 77 11/18/20 1000  Resp 19 11/18/20 1000  SpO2 100 % 11/18/20 1000  Vitals shown include unvalidated device data.  Last Pain:  Vitals:   11/18/20 0714  TempSrc: Tympanic  PainSc: 0-No pain      Patients Stated Pain Goal: 0 (16/07/37 1062)  Complications: No notable events documented.

## 2020-11-19 ENCOUNTER — Encounter: Payer: Self-pay | Admitting: Surgery

## 2020-12-07 ENCOUNTER — Inpatient Hospital Stay: Payer: BC Managed Care – PPO

## 2020-12-07 ENCOUNTER — Encounter: Payer: Self-pay | Admitting: Oncology

## 2020-12-07 ENCOUNTER — Inpatient Hospital Stay (HOSPITAL_BASED_OUTPATIENT_CLINIC_OR_DEPARTMENT_OTHER): Payer: BC Managed Care – PPO | Admitting: Oncology

## 2020-12-07 VITALS — BP 133/80 | HR 87 | Temp 97.8°F | Resp 16 | Ht 72.0 in | Wt 205.0 lb

## 2020-12-07 DIAGNOSIS — Z96652 Presence of left artificial knee joint: Secondary | ICD-10-CM | POA: Diagnosis not present

## 2020-12-07 DIAGNOSIS — Z5112 Encounter for antineoplastic immunotherapy: Secondary | ICD-10-CM

## 2020-12-07 DIAGNOSIS — K439 Ventral hernia without obstruction or gangrene: Secondary | ICD-10-CM

## 2020-12-07 DIAGNOSIS — C641 Malignant neoplasm of right kidney, except renal pelvis: Secondary | ICD-10-CM

## 2020-12-07 LAB — CBC WITH DIFFERENTIAL/PLATELET
Abs Immature Granulocytes: 0.03 10*3/uL (ref 0.00–0.07)
Basophils Absolute: 0.1 10*3/uL (ref 0.0–0.1)
Basophils Relative: 1 %
Eosinophils Absolute: 0.4 10*3/uL (ref 0.0–0.5)
Eosinophils Relative: 5 %
HCT: 43.2 % (ref 39.0–52.0)
Hemoglobin: 14.3 g/dL (ref 13.0–17.0)
Immature Granulocytes: 0 %
Lymphocytes Relative: 26 %
Lymphs Abs: 2.3 10*3/uL (ref 0.7–4.0)
MCH: 29.4 pg (ref 26.0–34.0)
MCHC: 33.1 g/dL (ref 30.0–36.0)
MCV: 88.7 fL (ref 80.0–100.0)
Monocytes Absolute: 0.6 10*3/uL (ref 0.1–1.0)
Monocytes Relative: 7 %
Neutro Abs: 5.5 10*3/uL (ref 1.7–7.7)
Neutrophils Relative %: 61 %
Platelets: 318 10*3/uL (ref 150–400)
RBC: 4.87 MIL/uL (ref 4.22–5.81)
RDW: 12.8 % (ref 11.5–15.5)
WBC: 8.9 10*3/uL (ref 4.0–10.5)
nRBC: 0 % (ref 0.0–0.2)

## 2020-12-07 LAB — COMPREHENSIVE METABOLIC PANEL
ALT: 18 U/L (ref 0–44)
AST: 22 U/L (ref 15–41)
Albumin: 4.3 g/dL (ref 3.5–5.0)
Alkaline Phosphatase: 76 U/L (ref 38–126)
Anion gap: 10 (ref 5–15)
BUN: 23 mg/dL — ABNORMAL HIGH (ref 6–20)
CO2: 26 mmol/L (ref 22–32)
Calcium: 9.8 mg/dL (ref 8.9–10.3)
Chloride: 101 mmol/L (ref 98–111)
Creatinine, Ser: 1.05 mg/dL (ref 0.61–1.24)
GFR, Estimated: >60 mL/min (ref >60)
Glucose, Bld: 159 mg/dL — ABNORMAL HIGH (ref 70–99)
Potassium: 4.5 mmol/L (ref 3.5–5.1)
Sodium: 137 mmol/L (ref 135–145)
Total Bilirubin: 0.5 mg/dL (ref 0.3–1.2)
Total Protein: 7.4 g/dL (ref 6.5–8.1)

## 2020-12-07 MED ORDER — SODIUM CHLORIDE 0.9 % IV SOLN
Freq: Once | INTRAVENOUS | Status: AC
  Filled 2020-12-07: qty 250

## 2020-12-07 MED ORDER — SODIUM CHLORIDE 0.9 % IV SOLN
200.0000 mg | Freq: Once | INTRAVENOUS | Status: AC
  Administered 2020-12-07: 200 mg via INTRAVENOUS
  Filled 2020-12-07: qty 8

## 2020-12-07 NOTE — Patient Instructions (Signed)
[facility]  Discharge Instructions: Thank you for choosing [facility] to provide your oncology and hematology care.  If you have a lab appointment with the Cancer Center, please go directly to the Cancer Center and check in at the registration area.  Wear comfortable clothing and clothing appropriate for easy access to any Portacath or PICC line.   We strive to give you quality time with your provider. You may need to reschedule your appointment if you arrive late (15 or more minutes).  Arriving late affects you and other patients whose appointments are after yours.  Also, if you miss three or more appointments without notifying the office, you may be dismissed from the clinic at the provider's discretion.      For prescription refill requests, have your pharmacy contact our office and allow 72 hours for refills to be completed.    Today you received the following chemotherapy and/or immunotherapy agents : Keytruda    To help prevent nausea and vomiting after your treatment, we encourage you to take your nausea medication as directed.  BELOW ARE SYMPTOMS THAT SHOULD BE REPORTED IMMEDIATELY: *FEVER GREATER THAN 100.4 F (38 C) OR HIGHER *CHILLS OR SWEATING *NAUSEA AND VOMITING THAT IS NOT CONTROLLED WITH YOUR NAUSEA MEDICATION *UNUSUAL SHORTNESS OF BREATH *UNUSUAL BRUISING OR BLEEDING *URINARY PROBLEMS (pain or burning when urinating, or frequent urination) *BOWEL PROBLEMS (unusual diarrhea, constipation, pain near the anus) TENDERNESS IN MOUTH AND THROAT WITH OR WITHOUT PRESENCE OF ULCERS (sore throat, sores in mouth, or a toothache) UNUSUAL RASH, SWELLING OR PAIN  UNUSUAL VAGINAL DISCHARGE OR ITCHING   Items with * indicate a potential emergency and should be followed up as soon as possible or go to the Emergency Department if any problems should occur.  Please show the CHEMOTHERAPY ALERT CARD or IMMUNOTHERAPY ALERT CARD at check-in  to the Emergency Department and triage nurse.  Should you have questions after your visit or need to cancel or reschedule your appointment, please contact [facility]  [phone] and follow the prompts.  Office hours are 8:00 a.m. to 4:30 p.m. Monday - Friday. Please note that voicemails left after 4:00 p.m. may not be returned until the following business day.  We are closed weekends and major holidays. You have access to a nurse at all times for urgent questions. Please call the main number to the clinic [phone] and follow the prompts.  For any non-urgent questions, you may also contact your provider using MyChart. We now offer e-Visits for anyone 18 and older to request care online for non-urgent symptoms. For details visit [email or web address].   Also download the MyChart app! Go to the app store, search "MyChart", open the app, select [facility], and log in with your MyChart username and password.  Due to Covid, a mask is required upon entering the hospital/clinic. If you do not have a mask, one will be given to you upon arrival. For doctor visits, patients may have 1 support person aged 18 or older with them. For treatment visits, patients cannot have anyone with them due to current Covid guidelines and our immunocompromised population.  

## 2020-12-07 NOTE — Progress Notes (Signed)
Hematology/Oncology follow up note [facility] Telephone:([phone]) [phone] Fax:(336) [phone]   Patient Care Team: Danae Orleans, MD as PCP - General (Internal Medicine) [name], [name], MD as PCP - Family Medicine (Family Medicine) [name], MD as Consulting Physician (General Surgery) [name], [name], MD as Attending Physician (Family Medicine) [name], [name], MD as Consulting Physician (Dermatology)  REFERRING PROVIDER: Danae Orleans, MD  CHIEF COMPLAINTS/REASON FOR VISIT:  Follow-up of kidney cancer  HISTORY OF PRESENTING ILLNESS:   Matthew Woodard is a  55 y.o.  male with PMH listed below was seen in consultation at the request of  Danae Orleans, MD  for evaluation of kidney cancer Patient's cancer history dated back to February 2020 when he developed gross hematuria with small clots and right-sided flank pain.  Patient had a CT urogram done which showed right 4 cm central enhancing renal mass with invasion into the collecting system.  X-ray of chest was performed to see complete staging which showed no concerning findings. 06/28/2018 patient underwent a cystoscopy, bladder biopsy and a right retrograde pyelogram with intraoperative interpretation, right diagnostic ureteroscopy, right renal pelvis biopsy, right ureteral stent placement.  biopsy showed atypical cell clusters, with extensive crush artifact, nondiagnostic.   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  Surgical margins negative for tumor.pT3a,Nx Patient has been on surveillance after surgery. 10/28/2018 CT abdomen pelvis with contrast showed minimal fluid and or postoperative changes within the right renal fossa.  No evidence of metastatic disease or recurrence.  Patient has left kidney lesion previously  characterized as nonenhancing cyst by multi phasic contrast-enhanced CT. Patient reports an episode of gross hematuria in early December 2020, patient is due for 54-month surveillance CT scan. 04/30/2019 CT abdomen pelvis with contrast showed high attenuation mass at the right uretero vesicle junction, with extension into the bladder.  Bilateral pulmonary nodules, most indicated for metastatic disease.   Patient underwent cystoscopy and transurethral resection of irregular bladder tumor 3 cm.  Mass appeared to emanate from the right ureteral orifice.  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 Patient was referred to me for further work-up and management.  #Staging chest CT with contrast showed numerous bilateral pulmonary nodules.  Compatible with metastatic disease. Bone scan showed no osseous metastatic disease.   #History of tobacco abuse, former smoker.  Quit in 2015.  He denies any hemoptysis, chest pain, shortness of breath today. Patient reports that hematuria has completely resolved.  Denies any pain today. He is accompanied by his wife.  #History of cutaneous psoriasis, mostly on his right hand, currently on weekly methotrexate 12.5 mg with good symptom control. #NGS: [facility] PD L1 TPS 1%  # patient was referred to ENT for evaluation of headache and sinus pressure.  He had a ENT evaluation and feels that patient does not have sinusitis.  [name] recommended MRI brain to rule out PRES syndrome.  Axitinib has been held MRI brain was done and was negative.   #09/2019 off axitinib and Keytruda since the beginning of May due to transaminitis. Patient was seen and evaluated by gastroenterology [name]. Work-up was negative. Ultrasound liver vascular Doppler is negative. Recommend observation. #11/24/2019, resumed on Keytruda every 3 weeks #01/05/2020, axitinib was resumed 3 mg #01/20/2020, axitinib was discontinued due to recurrence of  transaminitis  Beryle Flock was temporarily held. #  03/16/2020, resumed on Keytruda every 3 weeks. 09/29/2020 CT scan showed no evidence of local recurrence or new/progressive disease in the chest abdomen or pelvis.  Tiny nonspecific lung nodules are unchanged.  No new lung nodules. May 2022, left knee replacement  INTERVAL HISTORY Matthew Woodard is a 55 y.o. male who has above history reviewed by me today presents for follow up visit for management of metastatic RCC. Patient reports feeling well today with no new complaints. No nausea vomiting diarrhea, skin rash, chest pain or shortness of breath.  Patient has had abdominal hernia repair meant that and tolerated procedure well.  No concerns of the surgical sites. Patient planned to have right knee replacement this fall.  Left knee status post knee replacement, pain has improved.   Review of Systems  Constitutional:  Negative for appetite change, chills, fatigue, fever and unexpected weight change.  HENT:   Negative for hearing loss and voice change.   Eyes:  Negative for eye problems and icterus.  Respiratory:  Negative for chest tightness, cough and shortness of breath.   Cardiovascular:  Negative for chest pain and leg swelling.  Gastrointestinal:  Negative for abdominal distention, abdominal pain, blood in stool and nausea.  Endocrine: Negative for hot flashes.  Genitourinary:  Negative for difficulty urinating, dysuria and frequency.   Musculoskeletal:  Positive for arthralgias.  Skin:  Negative for itching and rash.  Neurological:  Negative for extremity weakness, headaches, light-headedness and numbness.  Hematological:  Negative for adenopathy. Does not bruise/bleed easily.  Psychiatric/Behavioral:  Negative for confusion.    MEDICAL HISTORY:  Past Medical History:  Diagnosis Date   Arthritis    hands, left knee   Cancer ([place])    Diabetes mellitus without complication ([place])    Eczema 01/08/2017   GERD (gastroesophageal reflux  disease)    History of kidney cancer    Hyperlipidemia LDL goal <100 11/02/2014   Hypertension    Kidney stone    YEAR H/O HEMATURIA   Psoriasis    Wears dentures    full upper, partial lower.  Has, does not wear    SURGICAL HISTORY: Past Surgical History:  Procedure Laterality Date   COLONOSCOPY WITH PROPOFOL N/A 02/23/2017   Procedure: COLONOSCOPY WITH PROPOFOL;  Surgeon: [name], MD;  Location: [place];  Service: Endoscopy;  Laterality: N/A;  Diabetic - oral meds   CYST EXCISION  Aug. 2016   on back   CYSTOSCOPY W/ RETROGRADES Right 06/28/2018   Procedure: CYSTOSCOPY WITH RETROGRADE PYELOGRAM;  Surgeon: Billey Co, MD;  Location: [facility];  Service: Urology;  Laterality: Right;   CYSTOSCOPY W/ URETERAL STENT REMOVAL Right 07/22/2018   Procedure: CYSTOSCOPY WITH STENT REMOVAL;  Surgeon: Billey Co, MD;  Location: [facility];  Service: Urology;  Laterality: Right;   CYSTOSCOPY WITH BIOPSY Right 06/28/2018   Procedure: CYSTOSCOPY WITH RENAL BIOPSY;  Surgeon: Billey Co, MD;  Location: [facility];  Service: Urology;  Laterality: Right;   CYSTOSCOPY WITH URETEROSCOPY AND STENT PLACEMENT Right 06/28/2018   Procedure: CYSTOSCOPY WITH URETEROSCOPY AND STENT PLACEMENT;  Surgeon: Billey Co, MD;  Location: [facility];  Service: Urology;  Laterality: Right;   FULGURATION OF BLADDER TUMOR N/A 06/28/2018   Procedure: CYSTOSCOPY BLADDER BIOPSY, FULGERATION OF BLADDER;  Surgeon: Billey Co, MD;  Location: [facility];  Service: Urology;  Laterality: N/A;   KNEE SURGERY Left    LAPAROSCOPIC NEPHRECTOMY, HAND ASSISTED Right 07/22/2018   Procedure: HAND ASSISTED LAPAROSCOPIC NEPHRECTOMY;  Surgeon: Billey Co, MD;  Location: [facility];  Service: Urology;  Laterality: Right;   NASAL SINUS SURGERY  03/15   POLYPECTOMY N/A 02/23/2017   Procedure: POLYPECTOMY;  Surgeon: [name], MD;  Location: [place];  Service: Endoscopy;  Laterality: N/A;   tooth  abstraction     TRANSURETHRAL RESECTION OF BLADDER TUMOR N/A 05/12/2019   Procedure: TRANSURETHRAL RESECTION OF BLADDER TUMOR (TURBT);  Surgeon: Billey Co, MD;  Location: [facility];  Service: Urology;  Laterality: N/A;   VARICOCELE EXCISION     XI ROBOTIC ASSISTED VENTRAL HERNIA N/A 11/18/2020   Procedure: XI ROBOTIC ASSISTED VENTRAL HERNIA, incisional;  Surgeon: Jules Husbands, MD;  Location: [facility];  Service: General;  Laterality: N/A;    SOCIAL HISTORY: Social History   Socioeconomic History   Marital status: Married    Spouse name: Alma Friendly   Number of children: Not on file   Years of education: Not on file   Highest education level: Not on file  Occupational History   Not on file  Tobacco Use   Smoking status: Former    Types: Cigarettes    Quit date: 06/15/2013    Years since quitting: 7.4   Smokeless tobacco: Never  Vaping Use   Vaping Use: Never used  Substance and Sexual Activity   Alcohol use: No    Alcohol/week: 0.0 standard drinks   Drug use: No   Sexual activity: Yes  Other Topics Concern   Not on file  Social History Narrative   Lives with wife, sister in law   Social Determinants of Health   Financial Resource Strain: Not on file  Food Insecurity: Not on file  Transportation Needs: Not on file  Physical Activity: Not on file  Stress: Not on file  Social Connections: Not on file  Intimate Partner Violence: Not on file    FAMILY HISTORY: Family History  Problem Relation Age of Onset   Heart disease Father    Hypertension Father    COPD Father    Colon cancer Maternal Grandmother    Heart attack Paternal Grandfather     ALLERGIES:  is allergic to glucotrol [glipizide].  MEDICATIONS:  Current Outpatient Medications  Medication Sig Dispense Refill   aspirin 81 MG EC tablet Take 81 mg by mouth daily with supper.     clobetasol ointment (TEMOVATE) [id number] % Apply 1 application topically 2 (two) times daily as needed for rash.     cyclobenzaprine  (FLEXERIL) 10 MG tablet Take 10 mg by mouth 3 (three) times daily as needed for muscle spasms.     Glucose Blood (ACCU-CHEK AVIVA PLUS VI) by In Vitro route.     HYDROcodone-acetaminophen (NORCO/VICODIN) 5-325 MG tablet Take 1-2 tablets by mouth every 4 (four) hours as needed for moderate pain. 20 tablet 0   JARDIANCE 10 MG TABS tablet TAKE 1 TABLET BY MOUTH DAILY (Patient taking differently: Take 10 mg by mouth daily with supper.) 30 tablet 2   lisinopril (PRINIVIL,ZESTRIL) 10 MG tablet TAKE 1 TABLET(10 MG) BY MOUTH DAILY (Patient taking differently: Take 10 mg by mouth daily with supper.) 90 tablet 1   metFORMIN (GLUCOPHAGE-XR) 500 MG 24 hr tablet Take 3 pills by mouth daily for 4-5 days, then increase to 4 pills daily (if tolerated) (Patient taking differently: Take 1,000 mg by mouth daily with supper.) 360 tablet 0   Multiple Vitamin (MULTIVITAMIN) tablet Take 1 tablet by mouth daily with supper.     omeprazole (PRILOSEC) 20  MG capsule Take 20 mg by mouth daily as needed ([place]).     oxyCODONE (OXY IR/ROXICODONE) 5 MG immediate release tablet Take 5 mg by mouth every 6 (six) hours as needed for severe pain.     OZEMPIC, 0.25 OR 0.5 MG/DOSE, 2 MG/1.5ML SOPN Inject 1 mg into the skin every Sunday.     prochlorperazine (COMPAZINE) 10 MG tablet Take 1 tablet (10 mg total) by mouth every 6 (six) hours as needed for nausea or vomiting. 15 tablet 0   rosuvastatin (CRESTOR) 20 MG tablet Take 20 mg by mouth daily with supper.     traZODone (DESYREL) 50 MG tablet Take 1 tablet (50 mg total) by mouth at bedtime. (Patient taking differently: Take 50 mg by mouth at bedtime as needed for sleep.) 30 tablet 0   No current facility-administered medications for this visit.     PHYSICAL EXAMINATION: ECOG PERFORMANCE STATUS: 1 - Symptomatic but completely ambulatory Vitals:   12/07/20 0832  BP: 133/80  Pulse: 87  Resp: 16  Temp: 97.8 F (36.6 C)  SpO2: 99%   Filed Weights   12/07/20 0832  Weight:  205 lb (93 kg)    Physical Exam Constitutional:      General: He is not in acute distress.    Comments: Patient walks with a walker  HENT:     Head: Normocephalic and atraumatic.  Eyes:     General: No scleral icterus.    Pupils: Pupils are equal, round, and reactive to light.  Cardiovascular:     Rate and Rhythm: Normal rate and regular rhythm.     Heart sounds: Normal heart sounds.  Pulmonary:     Effort: Pulmonary effort is normal. No respiratory distress.     Breath sounds: No wheezing.  Abdominal:     General: Bowel sounds are normal. There is no distension.     Palpations: Abdomen is soft. There is no mass.     Tenderness: There is no abdominal tenderness.     Comments: Hernia repairment surgery  Musculoskeletal:        General: No deformity. Normal range of motion.     Cervical back: Normal range of motion and neck supple.     Comments: Left knee status post knee replacement.   Skin:    General: Skin is warm and dry.     Findings: No erythema or rash.  Neurological:     Mental Status: He is alert and oriented to person, place, and time. Mental status is at baseline.     Cranial Nerves: No cranial nerve deficit.     Coordination: Coordination normal.  Psychiatric:        Mood and Affect: Mood normal.    LABORATORY DATA:  I have reviewed the data as listed Lab Results  Component Value Date   WBC 8.8 11/16/2020   HGB 14.2 11/16/2020   HCT 43.8 11/16/2020   MCV 88.5 11/16/2020   PLT 246 11/16/2020   Recent Labs    12/25/19 1031 12/25/19 1125 01/05/20 0840 01/12/20 1105 01/26/20 0829 02/12/20 1051 02/19/20 1115 02/27/20 1055 03/16/20 0830 10/05/20 0836 10/26/20 0859 11/16/20 0828  NA 134*  --  134*  --  135  --   --   --    < > 136 136 134*  K 4.7  --  4.4  --  4.6  --   --   --    < > 4.5 4.4 4.3  CL 104  --  101  --  102  --   --   --    < > 98 100 101  CO2 24  --  27  --  24  --   --   --    < > '26 28 27  '$ GLUCOSE 98  --  94  --  96  --   --   --     < > 148* 135* 129*  BUN 24*  --  26*  --  23*  --   --   --    < > 24* 24* 21*  CREATININE 1.04  --  1.20  --  1.10  --   --   --    < > 1.10 0.95 0.86  CALCIUM 8.8*  --  9.1  --  9.2  --   --   --    < > 9.7 9.5 9.2  GFRNONAA >60  --  >60  --  >60  --   --   --    < > >60 >60 >60  GFRAA >60  --  >60  --  >60  --   --   --   --   --   --   --   PROT 6.8   < > 6.9   < > 6.6 6.7 6.8 6.9   < > 7.6 7.3 7.1  ALBUMIN 4.0   < > 4.2   < > 3.9 4.1 4.0 4.2   < > 4.2 4.1 4.1  AST 24   < > 23   < > 183* 130* 99* 67*   < > '20 22 21  '$ ALT 22   < > 21   < > 336* 266* 203* 127*   < > '21 22 18  '$ ALKPHOS 46   < > 50   < > 60 66 64 57   < > 76 73 66  BILITOT 0.8   < > 0.7   < > 0.9 1.2 1.1 1.0   < > 0.8 0.5 0.6  BILIDIR  --    < >  --    < >  --  0.3* 0.2 0.2  --   --   --   --   IBILI  --    < >  --    < >  --  0.9 0.9 0.8  --   --   --   --    < > = values in this interval not displayed.    Iron/TIBC/Ferritin/ %Sat    Component Value Date/Time   IRON 118 09/25/2019 1115   TIBC 454 (H) 09/25/2019 1115   FERRITIN 236 09/25/2019 1115   IRONPCTSAT 26 09/25/2019 1115      RADIOGRAPHIC STUDIES: I have personally reviewed the radiological images as listed and agreed with the findings in the report.  CT CHEST ABDOMEN PELVIS WO CONTRAST  Result Date: 09/30/2020 CLINICAL DATA:  Restaging metastatic renal cell carcinoma. EXAM: CT CHEST, ABDOMEN AND PELVIS WITHOUT CONTRAST TECHNIQUE: Multidetector CT imaging of the chest, abdomen and pelvis was performed following the standard protocol without IV contrast. COMPARISON:  Multiple priors including CT chest abdomen pelvis May 11, 2020 FINDINGS: CT CHEST FINDINGS Cardiovascular: Aortic atherosclerosis without aneurysmal dilation. Coronary artery calcifications. Normal size heart. No significant pericardial effusion/thickening. Mediastinum/Nodes: Thyroid unremarkable. No pathologically enlarged mediastinal, hilar or axillary lymph nodes visualized within the  limitations of noncontrast examination. Trachea is unremarkable. Lungs/Pleura: No significant change in size of the scattered small  pulmonary nodules. Index nodule within the posterolateral left upper lobe on image 46/3 measures 4 mm, unchanged. No new suspicious pulmonary nodule. Scattered areas of subsegmental atelectasis in the left lung base lingula and right middle lobe appears similar prior. No pleural effusion. No pneumothorax. Musculoskeletal: No chest wall mass or suspicious bone lesions identified. CT ABDOMEN PELVIS FINDINGS Hepatobiliary: Hepatic parenchyma is grossly unremarkable in noncontrast appearance. Gallbladder is grossly unremarkable. No biliary ductal dilation. Pancreas: Unremarkable. No pancreatic ductal dilatation or surrounding inflammatory changes. Spleen: Normal in size without focal abnormality. Adrenals/Urinary Tract: Bilateral adrenal glands unremarkable. Prior right nephrectomy without new suspicious soft tissue nodularity in the nephrectomy bed, to suggest local recurrence. Left kidney demonstrates no hydronephrosis or renal stones. Hypodense 1.2 cm cortical lesion in the interpolar region of the left kidney on image 72/2, consistent with a cyst. Stomach/Bowel: Stomach is grossly unremarkable. Enteric contrast traverses the terminal ileum. No pathologic dilation of small bowel. Appendix is grossly unremarkable. No suspicious colonic wall thickening or masslike lesions. Vascular/Lymphatic: Aortic atherosclerosis without aneurysmal dilation. No pathologically enlarged abdominal or pelvic lymph nodes. Reproductive: Prostate is unremarkable. Other: No abdominopelvic ascites. Musculoskeletal: No acute or significant osseous findings. IMPRESSION: 1. Stable examination without evidence of local recurrence or new/progressive metastatic disease in the chest abdomen or pelvis. 2. Tiny nonspecific pulmonary nodules are unchanged from prior examination. No new suspicious pulmonary nodule. 3.  Aortic atherosclerosis. Aortic Atherosclerosis (ICD10-I70.0). Electronically Signed   By: Dahlia Bailiff MD   On: 09/30/2020 19:14       ASSESSMENT & PLAN:  1. Clear cell carcinoma of right kidney ([place])   2. Encounter for antineoplastic immunotherapy   3. History of left knee replacement   4. 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 Labs reviewed and discussed with patient.  Proceed with Keytruda treatment today Patient may have some focal inflammation status post abdominal hernia repair surgery.  I will postpone next CT surveillance to  September 2022  Recent history of knee replacement.  Follow-up with orthopedic surgeon.  Continue aspirin 81 mg for DVT prophylaxis. Abdominal wall hernia, status post a repair.  Follow-up in 3 weeks We spent sufficient time to discuss many aspect of care, questions were answered to patient's satisfaction. All questions were answered. The patient knows to call the clinic with any problems questions or concerns.  Earlie Server, MD, PhD Hematology Oncology [facility] Pager- [phone] 12/07/2020

## 2020-12-08 ENCOUNTER — Ambulatory Visit (INDEPENDENT_AMBULATORY_CARE_PROVIDER_SITE_OTHER): Payer: BC Managed Care – PPO | Admitting: Surgery

## 2020-12-08 ENCOUNTER — Other Ambulatory Visit: Payer: Self-pay

## 2020-12-08 VITALS — BP 142/92 | HR 112 | Temp 98.8°F | Ht 72.0 in | Wt 202.0 lb

## 2020-12-08 DIAGNOSIS — Z09 Encounter for follow-up examination after completed treatment for conditions other than malignant neoplasm: Secondary | ICD-10-CM

## 2020-12-08 NOTE — Patient Instructions (Signed)
Please call the office if you have any questions or concerns. 

## 2020-12-09 ENCOUNTER — Encounter: Payer: Self-pay | Admitting: Surgery

## 2020-12-09 NOTE — Progress Notes (Signed)
Matthew Woodard is a 55 year old male status post robotic ventral hernia repair 3 weeks ago.  Matthew Woodard is doing very well.  No fevers no chills currently denies any abdominal pain.  No complications.  Physical exam: No acute distress.  Awake and alert Abdomen: Soft nontender incisions healing well without evidence of infection.  There is no evidence of recurrent hernia  A/P doing very well No evidence of complications.  No heavy lifting  RTC as needed

## 2020-12-28 ENCOUNTER — Inpatient Hospital Stay: Payer: BC Managed Care – PPO | Attending: Oncology

## 2020-12-28 ENCOUNTER — Inpatient Hospital Stay: Payer: BC Managed Care – PPO

## 2020-12-28 ENCOUNTER — Encounter: Payer: Self-pay | Admitting: Oncology

## 2020-12-28 ENCOUNTER — Inpatient Hospital Stay (HOSPITAL_BASED_OUTPATIENT_CLINIC_OR_DEPARTMENT_OTHER): Payer: BC Managed Care – PPO | Admitting: Oncology

## 2020-12-28 VITALS — BP 141/83 | HR 89 | Temp 96.7°F | Resp 16 | Ht 72.0 in | Wt 208.8 lb

## 2020-12-28 DIAGNOSIS — Z5112 Encounter for antineoplastic immunotherapy: Secondary | ICD-10-CM | POA: Diagnosis present

## 2020-12-28 DIAGNOSIS — E119 Type 2 diabetes mellitus without complications: Secondary | ICD-10-CM | POA: Diagnosis not present

## 2020-12-28 DIAGNOSIS — C641 Malignant neoplasm of right kidney, except renal pelvis: Secondary | ICD-10-CM

## 2020-12-28 DIAGNOSIS — Z96653 Presence of artificial knee joint, bilateral: Secondary | ICD-10-CM | POA: Diagnosis not present

## 2020-12-28 DIAGNOSIS — E785 Hyperlipidemia, unspecified: Secondary | ICD-10-CM | POA: Diagnosis not present

## 2020-12-28 DIAGNOSIS — C7800 Secondary malignant neoplasm of unspecified lung: Secondary | ICD-10-CM | POA: Insufficient documentation

## 2020-12-28 DIAGNOSIS — L409 Psoriasis, unspecified: Secondary | ICD-10-CM | POA: Diagnosis not present

## 2020-12-28 DIAGNOSIS — Z87891 Personal history of nicotine dependence: Secondary | ICD-10-CM | POA: Insufficient documentation

## 2020-12-28 DIAGNOSIS — C7911 Secondary malignant neoplasm of bladder: Secondary | ICD-10-CM | POA: Insufficient documentation

## 2020-12-28 DIAGNOSIS — Z7984 Long term (current) use of oral hypoglycemic drugs: Secondary | ICD-10-CM | POA: Diagnosis not present

## 2020-12-28 DIAGNOSIS — K219 Gastro-esophageal reflux disease without esophagitis: Secondary | ICD-10-CM | POA: Diagnosis not present

## 2020-12-28 DIAGNOSIS — Z7982 Long term (current) use of aspirin: Secondary | ICD-10-CM | POA: Insufficient documentation

## 2020-12-28 DIAGNOSIS — I10 Essential (primary) hypertension: Secondary | ICD-10-CM | POA: Diagnosis not present

## 2020-12-28 DIAGNOSIS — R21 Rash and other nonspecific skin eruption: Secondary | ICD-10-CM

## 2020-12-28 DIAGNOSIS — Z79899 Other long term (current) drug therapy: Secondary | ICD-10-CM | POA: Diagnosis not present

## 2020-12-28 LAB — CBC WITH DIFFERENTIAL/PLATELET
Abs Immature Granulocytes: 0.04 10*3/uL (ref 0.00–0.07)
Basophils Absolute: 0 10*3/uL (ref 0.0–0.1)
Basophils Relative: 1 %
Eosinophils Absolute: 0.3 10*3/uL (ref 0.0–0.5)
Eosinophils Relative: 3 %
HCT: 43.9 % (ref 39.0–52.0)
Hemoglobin: 14.4 g/dL (ref 13.0–17.0)
Immature Granulocytes: 1 %
Lymphocytes Relative: 28 %
Lymphs Abs: 2.1 10*3/uL (ref 0.7–4.0)
MCH: 28.9 pg (ref 26.0–34.0)
MCHC: 32.8 g/dL (ref 30.0–36.0)
MCV: 88 fL (ref 80.0–100.0)
Monocytes Absolute: 0.7 10*3/uL (ref 0.1–1.0)
Monocytes Relative: 9 %
Neutro Abs: 4.4 10*3/uL (ref 1.7–7.7)
Neutrophils Relative %: 58 %
Platelets: 228 10*3/uL (ref 150–400)
RBC: 4.99 MIL/uL (ref 4.22–5.81)
RDW: 13 % (ref 11.5–15.5)
WBC: 7.5 10*3/uL (ref 4.0–10.5)
nRBC: 0 % (ref 0.0–0.2)

## 2020-12-28 LAB — COMPREHENSIVE METABOLIC PANEL
ALT: 20 U/L (ref 0–44)
AST: 24 U/L (ref 15–41)
Albumin: 4.3 g/dL (ref 3.5–5.0)
Alkaline Phosphatase: 73 U/L (ref 38–126)
Anion gap: 9 (ref 5–15)
BUN: 19 mg/dL (ref 6–20)
CO2: 25 mmol/L (ref 22–32)
Calcium: 9.4 mg/dL (ref 8.9–10.3)
Chloride: 101 mmol/L (ref 98–111)
Creatinine, Ser: 1.1 mg/dL (ref 0.61–1.24)
GFR, Estimated: >60 mL/min (ref >60)
Glucose, Bld: 156 mg/dL — ABNORMAL HIGH (ref 70–99)
Potassium: 4.3 mmol/L (ref 3.5–5.1)
Sodium: 135 mmol/L (ref 135–145)
Total Bilirubin: 0.6 mg/dL (ref 0.3–1.2)
Total Protein: 7.2 g/dL (ref 6.5–8.1)

## 2020-12-28 MED ORDER — SODIUM CHLORIDE 0.9 % IV SOLN
Freq: Once | INTRAVENOUS | Status: AC
  Filled 2020-12-28: qty 250

## 2020-12-28 MED ORDER — SODIUM CHLORIDE 0.9% FLUSH
10.0000 mL | INTRAVENOUS | Status: DC | PRN
  Filled 2020-12-28: qty 10

## 2020-12-28 MED ORDER — SODIUM CHLORIDE 0.9 % IV SOLN
200.0000 mg | Freq: Once | INTRAVENOUS | Status: AC
  Administered 2020-12-28: 200 mg via INTRAVENOUS
  Filled 2020-12-28: qty 8

## 2020-12-28 NOTE — Progress Notes (Signed)
Hematology/Oncology follow up note [facility] Telephone:([phone]) [phone] Fax:(336) [phone]   Patient Care Team: Danae Orleans, MD as PCP - General (Internal Medicine) [name], [name], MD as PCP - Family Medicine (Family Medicine) [name], MD as Consulting Physician (General Surgery) [name], [name], MD as Attending Physician (Family Medicine) [name], [name], MD as Consulting Physician (Dermatology)  REFERRING PROVIDER: Danae Orleans, MD  CHIEF COMPLAINTS/REASON FOR VISIT:  Follow-up of kidney cancer  HISTORY OF PRESENTING ILLNESS:   Matthew Woodard is a  55 y.o.  male with PMH listed below was seen in consultation at the request of  Danae Orleans, MD  for evaluation of kidney cancer Patient's cancer history dated back to February 2020 when he developed gross hematuria with small clots and right-sided flank pain.  Patient had a CT urogram done which showed right 4 cm central enhancing renal mass with invasion into the collecting system.  X-ray of chest was performed to see complete staging which showed no concerning findings. 06/28/2018 patient underwent a cystoscopy, bladder biopsy and a right retrograde pyelogram with intraoperative interpretation, right diagnostic ureteroscopy, right renal pelvis biopsy, right ureteral stent placement.  biopsy showed atypical cell clusters, with extensive crush artifact, nondiagnostic.   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  Surgical margins negative for tumor.pT3a,Nx Patient has been on surveillance after surgery. 10/28/2018 CT abdomen pelvis with contrast showed minimal fluid and or postoperative changes within the right renal fossa.  No evidence of metastatic disease or recurrence.  Patient has left kidney lesion previously  characterized as nonenhancing cyst by multi phasic contrast-enhanced CT. Patient reports an episode of gross hematuria in early December 2020, patient is due for 54-month surveillance CT scan. 04/30/2019 CT abdomen pelvis with contrast showed high attenuation mass at the right uretero vesicle junction, with extension into the bladder.  Bilateral pulmonary nodules, most indicated for metastatic disease.   Patient underwent cystoscopy and transurethral resection of irregular bladder tumor 3 cm.  Mass appeared to emanate from the right ureteral orifice.  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 Patient was referred to me for further work-up and management.  #Staging chest CT with contrast showed numerous bilateral pulmonary nodules.  Compatible with metastatic disease. Bone scan showed no osseous metastatic disease.   #History of tobacco abuse, former smoker.  Quit in 2015.  He denies any hemoptysis, chest pain, shortness of breath today. Patient reports that hematuria has completely resolved.  Denies any pain today. He is accompanied by his wife.  #History of cutaneous psoriasis, mostly on his right hand, currently on weekly methotrexate 12.5 mg with good symptom control. #NGS: [facility] PD L1 TPS 1%  # patient was referred to ENT for evaluation of headache and sinus pressure.  He had a ENT evaluation and feels that patient does not have sinusitis.  [name] recommended MRI brain to rule out PRES syndrome.  Axitinib has been held MRI brain was done and was negative.   #09/2019 off axitinib and Keytruda since the beginning of May due to transaminitis. Patient was seen and evaluated by gastroenterology [name]. Work-up was negative. Ultrasound liver vascular Doppler is negative. Recommend observation. #11/24/2019, resumed on Keytruda every 3 weeks #01/05/2020, axitinib was resumed 3 mg #01/20/2020, axitinib was discontinued due to recurrence of  transaminitis  Beryle Flock was temporarily held. #  03/16/2020, resumed on Keytruda every 3 weeks. 09/29/2020 CT scan showed no evidence of local recurrence or new/progressive disease in the chest abdomen or pelvis.  Tiny nonspecific lung nodules are unchanged.  No new lung nodules. May 2022, left knee replacement  INTERVAL HISTORY Matthew Woodard is a 55 y.o. male who has above history reviewed by me today presents for follow up visit for management of metastatic RCC. Denies nausea vomiting diarrhea. Patient has had intermittent rash on his thigh and trunk.  He uses hydrocortisone topical cream or Clobetasol ointment as needed topically.  Symptoms are controlled.  He applies Clobetasol ointment topically on his palms for psoriasis and his symptoms are stable. He plans to have right knee replacement in September 2022.   Review of Systems  Constitutional:  Negative for appetite change, chills, fatigue, fever and unexpected weight change.  HENT:   Negative for hearing loss and voice change.   Eyes:  Negative for eye problems and icterus.  Respiratory:  Negative for chest tightness, cough and shortness of breath.   Cardiovascular:  Negative for chest pain and leg swelling.  Gastrointestinal:  Negative for abdominal distention, abdominal pain, blood in stool and nausea.  Endocrine: Negative for hot flashes.  Genitourinary:  Negative for difficulty urinating, dysuria and frequency.   Musculoskeletal:  Positive for arthralgias.  Skin:  Positive for rash. Negative for itching.        maculopapular erythematous skin lesions on his thighs  Neurological:  Negative for extremity weakness, headaches, light-headedness and numbness.  Hematological:  Negative for adenopathy. Does not bruise/bleed easily.  Psychiatric/Behavioral:  Negative for confusion.    MEDICAL HISTORY:  Past Medical History:  Diagnosis Date   Arthritis    hands, left knee   Cancer ([place])    Diabetes mellitus without complication  ([place])    Eczema 01/08/2017   GERD (gastroesophageal reflux disease)    History of kidney cancer    Hyperlipidemia LDL goal <100 11/02/2014   Hypertension    Kidney stone    YEAR H/O HEMATURIA   Psoriasis    Wears dentures    full upper, partial lower.  Has, does not wear    SURGICAL HISTORY: Past Surgical History:  Procedure Laterality Date   COLONOSCOPY WITH PROPOFOL N/A 02/23/2017   Procedure: COLONOSCOPY WITH PROPOFOL;  Surgeon: [name], MD;  Location: [place];  Service: Endoscopy;  Laterality: N/A;  Diabetic - oral meds   CYST EXCISION  Aug. 2016   on back   CYSTOSCOPY W/ RETROGRADES Right 06/28/2018   Procedure: CYSTOSCOPY WITH RETROGRADE PYELOGRAM;  Surgeon: Billey Co, MD;  Location: [facility];  Service: Urology;  Laterality: Right;   CYSTOSCOPY W/ URETERAL STENT REMOVAL Right 07/22/2018   Procedure: CYSTOSCOPY WITH STENT REMOVAL;  Surgeon: Billey Co, MD;  Location: [facility];  Service: Urology;  Laterality: Right;   CYSTOSCOPY WITH BIOPSY Right 06/28/2018   Procedure: CYSTOSCOPY WITH RENAL BIOPSY;  Surgeon: Billey Co, MD;  Location: [facility];  Service: Urology;  Laterality: Right;   CYSTOSCOPY WITH URETEROSCOPY AND STENT PLACEMENT Right 06/28/2018   Procedure: CYSTOSCOPY WITH URETEROSCOPY AND STENT PLACEMENT;  Surgeon: Billey Co, MD;  Location: [facility];  Service: Urology;  Laterality: Right;   FULGURATION OF BLADDER TUMOR N/A 06/28/2018   Procedure: CYSTOSCOPY BLADDER BIOPSY, FULGERATION OF BLADDER;  Surgeon: Billey Co, MD;  Location: [facility];  Service: Urology;  Laterality: N/A;   KNEE SURGERY Left    LAPAROSCOPIC NEPHRECTOMY, HAND  ASSISTED Right 07/22/2018   Procedure: HAND ASSISTED LAPAROSCOPIC NEPHRECTOMY;  Surgeon: Billey Co, MD;  Location: [facility];  Service: Urology;  Laterality: Right;   NASAL SINUS SURGERY  03/15   POLYPECTOMY N/A 02/23/2017   Procedure: POLYPECTOMY;  Surgeon: [name], MD;  Location: [place];  Service: Endoscopy;  Laterality: N/A;   tooth abstraction     TRANSURETHRAL RESECTION OF BLADDER TUMOR N/A 05/12/2019   Procedure: TRANSURETHRAL RESECTION OF BLADDER TUMOR (TURBT);  Surgeon: Billey Co, MD;  Location: [facility];  Service: Urology;  Laterality: N/A;   VARICOCELE EXCISION     XI ROBOTIC ASSISTED VENTRAL HERNIA N/A 11/18/2020   Procedure: XI ROBOTIC ASSISTED VENTRAL HERNIA, incisional;  Surgeon: Jules Husbands, MD;  Location: [facility];  Service: General;  Laterality: N/A;    SOCIAL HISTORY: Social History   Socioeconomic History   Marital status: Married    Spouse name: Alma Friendly   Number of children: Not on file   Years of education: Not on file   Highest education level: Not on file  Occupational History   Not on file  Tobacco Use   Smoking status: Former    Types: Cigarettes    Quit date: 06/15/2013    Years since quitting: 7.5   Smokeless tobacco: Never  Vaping Use   Vaping Use: Never used  Substance and Sexual Activity   Alcohol use: No    Alcohol/week: 0.0 standard drinks   Drug use: No   Sexual activity: Yes  Other Topics Concern   Not on file  Social History Narrative   Lives with wife, sister in law   Social Determinants of Health   Financial Resource Strain: Not on file  Food Insecurity: Not on file  Transportation Needs: Not on file  Physical Activity: Not on file  Stress: Not on file  Social Connections: Not on file  Intimate Partner Violence: Not on file    FAMILY HISTORY: Family History  Problem Relation Age of Onset   Heart disease Father    Hypertension Father    COPD Father    Colon cancer Maternal Grandmother    Heart attack Paternal Grandfather     ALLERGIES:  is allergic to glucotrol [glipizide].  MEDICATIONS:  Current Outpatient Medications  Medication Sig Dispense Refill   aspirin 81 MG EC tablet Take 81 mg by mouth daily with supper.     clobetasol ointment (TEMOVATE) [id number] % Apply 1 application topically  2 (two) times daily as needed for rash.     cyclobenzaprine (FLEXERIL) 10 MG tablet Take 10 mg by mouth 3 (three) times daily as needed for muscle spasms.     Glucose Blood (ACCU-CHEK AVIVA PLUS VI) by In Vitro route.     HYDROcodone-acetaminophen (NORCO/VICODIN) 5-325 MG tablet Take 1-2 tablets by mouth every 4 (four) hours as needed for moderate pain. 20 tablet 0   JARDIANCE 10 MG TABS tablet TAKE 1 TABLET BY MOUTH DAILY (Patient taking differently: Take 10 mg by mouth daily with supper.) 30 tablet 2   lisinopril (PRINIVIL,ZESTRIL) 10 MG tablet TAKE 1 TABLET(10 MG) BY MOUTH DAILY (Patient taking differently: Take 10 mg by mouth daily with supper.) 90 tablet 1   metFORMIN (GLUCOPHAGE-XR) 500 MG 24 hr tablet Take 3 pills by mouth daily for 4-5 days, then increase to 4 pills daily (if tolerated) (Patient taking differently: Take 1,000 mg by mouth daily with supper.) 360 tablet 0   Multiple Vitamin (MULTIVITAMIN) tablet Take 1 tablet by  mouth daily with supper.     omeprazole (PRILOSEC) 20 MG capsule Take 20 mg by mouth daily as needed ([place]).     OZEMPIC, 0.25 OR 0.5 MG/DOSE, 2 MG/1.5ML SOPN Inject 1 mg into the skin every Sunday.     prochlorperazine (COMPAZINE) 10 MG tablet Take 1 tablet (10 mg total) by mouth every 6 (six) hours as needed for nausea or vomiting. 15 tablet 0   rosuvastatin (CRESTOR) 20 MG tablet Take 20 mg by mouth daily with supper.     traZODone (DESYREL) 50 MG tablet Take 1 tablet (50 mg total) by mouth at bedtime. (Patient taking differently: Take 50 mg by mouth at bedtime as needed for sleep.) 30 tablet 0   No current facility-administered medications for this visit.   Facility-Administered Medications Ordered in Other Visits  Medication Dose Route Frequency Provider Last Rate Last Admin   sodium chloride flush (NS) 0.9 % injection 10 mL  10 mL Intracatheter PRN Earlie Server, MD         PHYSICAL EXAMINATION: ECOG PERFORMANCE STATUS: 1 - Symptomatic but completely  ambulatory Vitals:   12/28/20 0856  BP: (!) 141/83  Pulse: 89  Resp: 16  Temp: (!) 96.7 F (35.9 C)  SpO2: 100%   Filed Weights   12/28/20 0856  Weight: 208 lb 12.8 oz (94.7 kg)    Physical Exam Constitutional:      General: He is not in acute distress.    Comments: Patient walks with a walker  HENT:     Head: Normocephalic and atraumatic.  Eyes:     General: No scleral icterus.    Pupils: Pupils are equal, round, and reactive to light.  Cardiovascular:     Rate and Rhythm: Normal rate and regular rhythm.     Heart sounds: Normal heart sounds.  Pulmonary:     Effort: Pulmonary effort is normal. No respiratory distress.     Breath sounds: No wheezing.  Abdominal:     General: Bowel sounds are normal. There is no distension.     Palpations: Abdomen is soft. There is no mass.     Tenderness: There is no abdominal tenderness.     Comments: Hernia repairment surgery  Musculoskeletal:        General: No deformity. Normal range of motion.     Cervical back: Normal range of motion and neck supple.     Comments: Left knee status post knee replacement.   Skin:    General: Skin is warm and dry.     Findings: No erythema or rash.  Neurological:     Mental Status: He is alert and oriented to person, place, and time. Mental status is at baseline.     Cranial Nerves: No cranial nerve deficit.     Coordination: Coordination normal.  Psychiatric:        Mood and Affect: Mood normal.    LABORATORY DATA:  I have reviewed the data as listed Lab Results  Component Value Date   WBC 7.5 12/28/2020   HGB 14.4 12/28/2020   HCT 43.9 12/28/2020   MCV 88.0 12/28/2020   PLT 228 12/28/2020   Recent Labs    01/05/20 0840 01/12/20 1105 01/26/20 0829 02/12/20 1051 02/19/20 1115 02/27/20 1055 03/16/20 0830 11/16/20 0828 12/07/20 0815 12/28/20 0821  NA 134*  --  135  --   --   --    < > 134* 137 135  K 4.4  --  4.6  --   --   --    < >  4.3 4.5 4.3  CL 101  --  102  --   --   --     < > 101 101 101  CO2 27  --  24  --   --   --    < > '27 26 25  '$ GLUCOSE 94  --  96  --   --   --    < > 129* 159* 156*  BUN 26*  --  23*  --   --   --    < > 21* 23* 19  CREATININE 1.20  --  1.10  --   --   --    < > 0.86 1.05 1.10  CALCIUM 9.1  --  9.2  --   --   --    < > 9.2 9.8 9.4  GFRNONAA >60  --  >60  --   --   --    < > >60 >60 >60  GFRAA >60  --  >60  --   --   --   --   --   --   --   PROT 6.9   < > 6.6 6.7 6.8 6.9   < > 7.1 7.4 7.2  ALBUMIN 4.2   < > 3.9 4.1 4.0 4.2   < > 4.1 4.3 4.3  AST 23   < > 183* 130* 99* 67*   < > '21 22 24  '$ ALT 21   < > 336* 266* 203* 127*   < > '18 18 20  '$ ALKPHOS 50   < > 60 66 64 57   < > 66 76 73  BILITOT 0.7   < > 0.9 1.2 1.1 1.0   < > 0.6 0.5 0.6  BILIDIR  --    < >  --  0.3* 0.2 0.2  --   --   --   --   IBILI  --    < >  --  0.9 0.9 0.8  --   --   --   --    < > = values in this interval not displayed.    Iron/TIBC/Ferritin/ %Sat    Component Value Date/Time   IRON 118 09/25/2019 1115   TIBC 454 (H) 09/25/2019 1115   FERRITIN 236 09/25/2019 1115   IRONPCTSAT 26 09/25/2019 1115      RADIOGRAPHIC STUDIES: I have personally reviewed the radiological images as listed and agreed with the findings in the report.  CT CHEST ABDOMEN PELVIS WO CONTRAST  Result Date: 09/30/2020 CLINICAL DATA:  Restaging metastatic renal cell carcinoma. EXAM: CT CHEST, ABDOMEN AND PELVIS WITHOUT CONTRAST TECHNIQUE: Multidetector CT imaging of the chest, abdomen and pelvis was performed following the standard protocol without IV contrast. COMPARISON:  Multiple priors including CT chest abdomen pelvis May 11, 2020 FINDINGS: CT CHEST FINDINGS Cardiovascular: Aortic atherosclerosis without aneurysmal dilation. Coronary artery calcifications. Normal size heart. No significant pericardial effusion/thickening. Mediastinum/Nodes: Thyroid unremarkable. No pathologically enlarged mediastinal, hilar or axillary lymph nodes visualized within the limitations of noncontrast  examination. Trachea is unremarkable. Lungs/Pleura: No significant change in size of the scattered small pulmonary nodules. Index nodule within the posterolateral left upper lobe on image 46/3 measures 4 mm, unchanged. No new suspicious pulmonary nodule. Scattered areas of subsegmental atelectasis in the left lung base lingula and right middle lobe appears similar prior. No pleural effusion. No pneumothorax. Musculoskeletal: No chest wall mass or suspicious bone lesions identified. CT ABDOMEN PELVIS FINDINGS Hepatobiliary: Hepatic parenchyma is  grossly unremarkable in noncontrast appearance. Gallbladder is grossly unremarkable. No biliary ductal dilation. Pancreas: Unremarkable. No pancreatic ductal dilatation or surrounding inflammatory changes. Spleen: Normal in size without focal abnormality. Adrenals/Urinary Tract: Bilateral adrenal glands unremarkable. Prior right nephrectomy without new suspicious soft tissue nodularity in the nephrectomy bed, to suggest local recurrence. Left kidney demonstrates no hydronephrosis or renal stones. Hypodense 1.2 cm cortical lesion in the interpolar region of the left kidney on image 72/2, consistent with a cyst. Stomach/Bowel: Stomach is grossly unremarkable. Enteric contrast traverses the terminal ileum. No pathologic dilation of small bowel. Appendix is grossly unremarkable. No suspicious colonic wall thickening or masslike lesions. Vascular/Lymphatic: Aortic atherosclerosis without aneurysmal dilation. No pathologically enlarged abdominal or pelvic lymph nodes. Reproductive: Prostate is unremarkable. Other: No abdominopelvic ascites. Musculoskeletal: No acute or significant osseous findings. IMPRESSION: 1. Stable examination without evidence of local recurrence or new/progressive metastatic disease in the chest abdomen or pelvis. 2. Tiny nonspecific pulmonary nodules are unchanged from prior examination. No new suspicious pulmonary nodule. 3. Aortic atherosclerosis. Aortic  Atherosclerosis (ICD10-I70.0). Electronically Signed   By: Dahlia Bailiff MD   On: 09/30/2020 19:14       ASSESSMENT & PLAN:  1. Clear cell carcinoma of right kidney ([place])   2. Encounter for antineoplastic immunotherapy   3. 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 Labs reviewed and discussed with patient Proceed with Keytruda treatment today. Obtain CT scan in September 2022  Recent history of knee replacement.  Follow-up with orthopedic surgeon.  Continue aspirin 81 mg for DVT prophylaxis.  Abdominal wall hernia, status post a repair. Skin rash, likely secondary to immunotherapy dermatitis.  Continue topical steroid cream. History of psoriasis, on topical steroid.  Symptoms are stable.  Continue monitor.  Follow-up in 3 weeks We spent sufficient time to discuss many aspect of care, questions were answered to patient's satisfaction. All questions were answered. The patient knows to call the clinic with any problems questions or concerns.  Earlie Server, MD, PhD Hematology Oncology [facility] at [facility] Pager- [phone] 12/28/2020

## 2020-12-28 NOTE — Patient Instructions (Signed)
[facility]  Discharge Instructions: Thank you for choosing [facility] to provide your oncology and hematology care.  If you have a lab appointment with the Cancer Center, please go directly to the Cancer Center and check in at the registration area.  Wear comfortable clothing and clothing appropriate for easy access to any Portacath or PICC line.   We strive to give you quality time with your provider. You may need to reschedule your appointment if you arrive late (15 or more minutes).  Arriving late affects you and other patients whose appointments are after yours.  Also, if you miss three or more appointments without notifying the office, you may be dismissed from the clinic at the provider's discretion.      For prescription refill requests, have your pharmacy contact our office and allow 72 hours for refills to be completed.    Today you received the following chemotherapy and/or immunotherapy agents - pembrolizumab      To help prevent nausea and vomiting after your treatment, we encourage you to take your nausea medication as directed.  BELOW ARE SYMPTOMS THAT SHOULD BE REPORTED IMMEDIATELY: *FEVER GREATER THAN 100.4 F (38 C) OR HIGHER *CHILLS OR SWEATING *NAUSEA AND VOMITING THAT IS NOT CONTROLLED WITH YOUR NAUSEA MEDICATION *UNUSUAL SHORTNESS OF BREATH *UNUSUAL BRUISING OR BLEEDING *URINARY PROBLEMS (pain or burning when urinating, or frequent urination) *BOWEL PROBLEMS (unusual diarrhea, constipation, pain near the anus) TENDERNESS IN MOUTH AND THROAT WITH OR WITHOUT PRESENCE OF ULCERS (sore throat, sores in mouth, or a toothache) UNUSUAL RASH, SWELLING OR PAIN  UNUSUAL VAGINAL DISCHARGE OR ITCHING   Items with * indicate a potential emergency and should be followed up as soon as possible or go to the Emergency Department if any problems should occur.  Please show the CHEMOTHERAPY ALERT CARD or IMMUNOTHERAPY ALERT CARD at  check-in to the Emergency Department and triage nurse.  Should you have questions after your visit or need to cancel or reschedule your appointment, please contact [facility]  [phone] and follow the prompts.  Office hours are 8:00 a.m. to 4:30 p.m. Monday - Friday. Please note that voicemails left after 4:00 p.m. may not be returned until the following business day.  We are closed weekends and major holidays. You have access to a nurse at all times for urgent questions. Please call the main number to the clinic [phone] and follow the prompts.  For any non-urgent questions, you may also contact your provider using MyChart. We now offer e-Visits for anyone 18 and older to request care online for non-urgent symptoms. For details visit [email or web address].   Also download the MyChart app! Go to the app store, search "MyChart", open the app, select [facility], and log in with your MyChart username and password.  Due to Covid, a mask is required upon entering the hospital/clinic. If you do not have a mask, one will be given to you upon arrival. For doctor visits, patients may have 1 support person aged 18 or older with them. For treatment visits, patients cannot have anyone with them due to current Covid guidelines and our immunocompromised population.   Pembrolizumab injection What is this medication? PEMBROLIZUMAB (pem broe liz ue mab) is a monoclonal antibody. It is used to treat certain types of cancer. This medicine may be used for other purposes; ask your health care provider or pharmacist if you have questions. COMMON BRAND NAME(S): Keytruda What should I tell my care   team before I take this medication? They need to know if you have any of these conditions: autoimmune diseases like Crohn's disease, ulcerative colitis, or lupus have had or planning to have an allogeneic stem cell transplant (uses someone else's stem cells) history of organ  transplant history of chest radiation nervous system problems like myasthenia gravis or Guillain-Barre syndrome an unusual or allergic reaction to pembrolizumab, other medicines, foods, dyes, or preservatives pregnant or trying to get pregnant breast-feeding How should I use this medication? This medicine is for infusion into a vein. It is given by a health care professional in a hospital or clinic setting. A special MedGuide will be given to you before each treatment. Be sure to read this information carefully each time. Talk to your pediatrician regarding the use of this medicine in children. While this drug may be prescribed for children as young as 6 months for selected conditions, precautions do apply. Overdosage: If you think you have taken too much of this medicine contact a poison control center or emergency room at once. NOTE: This medicine is only for you. Do not share this medicine with others. What if I miss a dose? It is important not to miss your dose. Call your doctor or health care professional if you are unable to keep an appointment. What may interact with this medication? Interactions have not been studied. This list may not describe all possible interactions. Give your health care provider a list of all the medicines, herbs, non-prescription drugs, or dietary supplements you use. Also tell them if you smoke, drink alcohol, or use illegal drugs. Some items may interact with your medicine. What should I watch for while using this medication? Your condition will be monitored carefully while you are receiving this medicine. You may need blood work done while you are taking this medicine. Do not become pregnant while taking this medicine or for 4 months after stopping it. Women should inform their doctor if they wish to become pregnant or think they might be pregnant. There is a potential for serious side effects to an unborn child. Talk to your health care professional or  pharmacist for more information. Do not breast-feed an infant while taking this medicine or for 4 months after the last dose. What side effects may I notice from receiving this medication? Side effects that you should report to your doctor or health care professional as soon as possible: allergic reactions like skin rash, itching or hives, swelling of the face, lips, or tongue bloody or black, tarry breathing problems changes in vision chest pain chills confusion constipation cough diarrhea dizziness or feeling faint or lightheaded fast or irregular heartbeat fever flushing joint pain low blood counts - this medicine may decrease the number of white blood cells, red blood cells and platelets. You may be at increased risk for infections and bleeding. muscle pain muscle weakness pain, tingling, numbness in the hands or feet persistent headache redness, blistering, peeling or loosening of the skin, including inside the mouth signs and symptoms of high blood sugar such as dizziness; dry mouth; dry skin; fruity breath; nausea; stomach pain; increased hunger or thirst; increased urination signs and symptoms of kidney injury like trouble passing urine or change in the amount of urine signs and symptoms of liver injury like dark urine, light-colored stools, loss of appetite, nausea, right upper belly pain, yellowing of the eyes or skin sweating swollen lymph nodes weight loss Side effects that usually do not require medical attention (report to your doctor   or health care professional if they continue or are bothersome): decreased appetite hair loss tiredness This list may not describe all possible side effects. Call your doctor for medical advice about side effects. You may report side effects to FDA at 1-800-FDA-1088. Where should I keep my medication? This drug is given in a hospital or clinic and will not be stored at home. NOTE: This sheet is a summary. It may not cover all possible  information. If you have questions about this medicine, talk to your doctor, pharmacist, or health care provider.  2022 [employer]/Gold Standard (2019-04-02 21:44:53)  

## 2021-01-14 ENCOUNTER — Ambulatory Visit
Admission: RE | Admit: 2021-01-14 | Discharge: 2021-01-14 | Disposition: A | Discharge status: Home / Self Care | Payer: BC Managed Care – PPO | Source: Ambulatory Visit | Attending: Oncology | Admitting: Oncology

## 2021-01-14 ENCOUNTER — Other Ambulatory Visit: Payer: Self-pay

## 2021-01-14 DIAGNOSIS — C641 Malignant neoplasm of right kidney, except renal pelvis: Secondary | ICD-10-CM | POA: Diagnosis not present

## 2021-01-18 ENCOUNTER — Inpatient Hospital Stay: Payer: BC Managed Care – PPO | Attending: Oncology

## 2021-01-18 ENCOUNTER — Inpatient Hospital Stay: Payer: BC Managed Care – PPO

## 2021-01-18 ENCOUNTER — Encounter: Payer: Self-pay | Admitting: Oncology

## 2021-01-18 ENCOUNTER — Inpatient Hospital Stay (HOSPITAL_BASED_OUTPATIENT_CLINIC_OR_DEPARTMENT_OTHER): Payer: BC Managed Care – PPO | Admitting: Oncology

## 2021-01-18 VITALS — BP 117/79 | HR 83 | Temp 98.2°F | Wt 212.1 lb

## 2021-01-18 DIAGNOSIS — E785 Hyperlipidemia, unspecified: Secondary | ICD-10-CM | POA: Insufficient documentation

## 2021-01-18 DIAGNOSIS — Z7984 Long term (current) use of oral hypoglycemic drugs: Secondary | ICD-10-CM | POA: Insufficient documentation

## 2021-01-18 DIAGNOSIS — C7800 Secondary malignant neoplasm of unspecified lung: Secondary | ICD-10-CM | POA: Diagnosis not present

## 2021-01-18 DIAGNOSIS — Z79899 Other long term (current) drug therapy: Secondary | ICD-10-CM | POA: Diagnosis not present

## 2021-01-18 DIAGNOSIS — Z5112 Encounter for antineoplastic immunotherapy: Secondary | ICD-10-CM

## 2021-01-18 DIAGNOSIS — L409 Psoriasis, unspecified: Secondary | ICD-10-CM | POA: Diagnosis not present

## 2021-01-18 DIAGNOSIS — Z87891 Personal history of nicotine dependence: Secondary | ICD-10-CM | POA: Insufficient documentation

## 2021-01-18 DIAGNOSIS — I10 Essential (primary) hypertension: Secondary | ICD-10-CM | POA: Diagnosis not present

## 2021-01-18 DIAGNOSIS — Z905 Acquired absence of kidney: Secondary | ICD-10-CM | POA: Insufficient documentation

## 2021-01-18 DIAGNOSIS — R21 Rash and other nonspecific skin eruption: Secondary | ICD-10-CM | POA: Diagnosis not present

## 2021-01-18 DIAGNOSIS — C641 Malignant neoplasm of right kidney, except renal pelvis: Secondary | ICD-10-CM | POA: Insufficient documentation

## 2021-01-18 DIAGNOSIS — E119 Type 2 diabetes mellitus without complications: Secondary | ICD-10-CM | POA: Diagnosis not present

## 2021-01-18 DIAGNOSIS — K219 Gastro-esophageal reflux disease without esophagitis: Secondary | ICD-10-CM | POA: Insufficient documentation

## 2021-01-18 LAB — CBC WITH DIFFERENTIAL/PLATELET
Abs Immature Granulocytes: 0.04 10*3/uL (ref 0.00–0.07)
Basophils Absolute: 0 10*3/uL (ref 0.0–0.1)
Basophils Relative: 1 %
Eosinophils Absolute: 0.3 10*3/uL (ref 0.0–0.5)
Eosinophils Relative: 4 %
HCT: 42.4 % (ref 39.0–52.0)
Hemoglobin: 14.2 g/dL (ref 13.0–17.0)
Immature Granulocytes: 1 %
Lymphocytes Relative: 29 %
Lymphs Abs: 2.1 10*3/uL (ref 0.7–4.0)
MCH: 29.9 pg (ref 26.0–34.0)
MCHC: 33.5 g/dL (ref 30.0–36.0)
MCV: 89.3 fL (ref 80.0–100.0)
Monocytes Absolute: 0.6 10*3/uL (ref 0.1–1.0)
Monocytes Relative: 8 %
Neutro Abs: 4.2 10*3/uL (ref 1.7–7.7)
Neutrophils Relative %: 57 %
Platelets: 225 10*3/uL (ref 150–400)
RBC: 4.75 MIL/uL (ref 4.22–5.81)
RDW: 12.9 % (ref 11.5–15.5)
WBC: 7.2 10*3/uL (ref 4.0–10.5)
nRBC: 0 % (ref 0.0–0.2)

## 2021-01-18 LAB — COMPREHENSIVE METABOLIC PANEL
ALT: 18 U/L (ref 0–44)
AST: 20 U/L (ref 15–41)
Albumin: 4.3 g/dL (ref 3.5–5.0)
Alkaline Phosphatase: 67 U/L (ref 38–126)
Anion gap: 8 (ref 5–15)
BUN: 25 mg/dL — ABNORMAL HIGH (ref 6–20)
CO2: 25 mmol/L (ref 22–32)
Calcium: 9.2 mg/dL (ref 8.9–10.3)
Chloride: 102 mmol/L (ref 98–111)
Creatinine, Ser: 1.06 mg/dL (ref 0.61–1.24)
GFR, Estimated: >60 mL/min (ref >60)
Glucose, Bld: 148 mg/dL — ABNORMAL HIGH (ref 70–99)
Potassium: 4.3 mmol/L (ref 3.5–5.1)
Sodium: 135 mmol/L (ref 135–145)
Total Bilirubin: 0.4 mg/dL (ref 0.3–1.2)
Total Protein: 7.5 g/dL (ref 6.5–8.1)

## 2021-01-18 MED ORDER — SODIUM CHLORIDE 0.9 % IV SOLN
200.0000 mg | Freq: Once | INTRAVENOUS | Status: AC
  Administered 2021-01-18: 200 mg via INTRAVENOUS
  Filled 2021-01-18: qty 8

## 2021-01-18 MED ORDER — SODIUM CHLORIDE 0.9 % IV SOLN
Freq: Once | INTRAVENOUS | Status: AC
  Filled 2021-01-18: qty 250

## 2021-01-18 NOTE — Progress Notes (Signed)
Patient here for follow up. No new concerns voiced.  °

## 2021-01-18 NOTE — Progress Notes (Signed)
Hematology/Oncology follow up note [facility] Telephone:([phone]) [phone] Fax:(336) [phone]   Patient Care Team: Danae Orleans, MD as PCP - General (Internal Medicine) [name], [name], MD as PCP - Family Medicine (Family Medicine) [name], MD as Consulting Physician (General Surgery) [name], [name], MD as Attending Physician (Family Medicine) [name], [name], MD as Consulting Physician (Dermatology)  REFERRING PROVIDER: Danae Orleans, MD  CHIEF COMPLAINTS/REASON FOR VISIT:  Follow-up of kidney cancer  HISTORY OF PRESENTING ILLNESS:   Matthew Woodard is a  55 y.o.  male with PMH listed below was seen in consultation at the request of  Danae Orleans, MD  for evaluation of kidney cancer Patient's cancer history dated back to February 2020 when he developed gross hematuria with small clots and right-sided flank pain.  Patient had a CT urogram done which showed right 4 cm central enhancing renal mass with invasion into the collecting system.  X-ray of chest was performed to see complete staging which showed no concerning findings. 06/28/2018 patient underwent a cystoscopy, bladder biopsy and a right retrograde pyelogram with intraoperative interpretation, right diagnostic ureteroscopy, right renal pelvis biopsy, right ureteral stent placement.  biopsy showed atypical cell clusters, with extensive crush artifact, nondiagnostic.   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  Surgical margins negative for tumor.pT3a,Nx Patient has been on surveillance after surgery. 10/28/2018 CT abdomen pelvis with contrast showed minimal fluid and or postoperative changes within the right renal fossa.  No evidence of metastatic disease or recurrence.  Patient has left kidney lesion previously  characterized as nonenhancing cyst by multi phasic contrast-enhanced CT. Patient reports an episode of gross hematuria in early December 2020, patient is due for 54-month surveillance CT scan. 04/30/2019 CT abdomen pelvis with contrast showed high attenuation mass at the right uretero vesicle junction, with extension into the bladder.  Bilateral pulmonary nodules, most indicated for metastatic disease.   Patient underwent cystoscopy and transurethral resection of irregular bladder tumor 3 cm.  Mass appeared to emanate from the right ureteral orifice.  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 Patient was referred to me for further work-up and management.  #Staging chest CT with contrast showed numerous bilateral pulmonary nodules.  Compatible with metastatic disease. Bone scan showed no osseous metastatic disease.   #History of tobacco abuse, former smoker.  Quit in 2015.  He denies any hemoptysis, chest pain, shortness of breath today. Patient reports that hematuria has completely resolved.  Denies any pain today. He is accompanied by his wife.  #History of cutaneous psoriasis, mostly on his right hand, currently on weekly methotrexate 12.5 mg with good symptom control. #NGS: [facility] PD L1 TPS 1%  # patient was referred to ENT for evaluation of headache and sinus pressure.  He had a ENT evaluation and feels that patient does not have sinusitis.  [name] recommended MRI brain to rule out PRES syndrome.  Axitinib has been held MRI brain was done and was negative.   #09/2019 off axitinib and Keytruda since the beginning of May due to transaminitis. Patient was seen and evaluated by gastroenterology [name]. Work-up was negative. Ultrasound liver vascular Doppler is negative. Recommend observation. #11/24/2019, resumed on Keytruda every 3 weeks #01/05/2020, axitinib was resumed 3 mg #01/20/2020, axitinib was discontinued due to recurrence of  transaminitis  Beryle Flock was temporarily held. #  03/16/2020, resumed on Keytruda every 3 weeks. 09/29/2020 CT scan showed no evidence of local recurrence or new/progressive disease in the chest abdomen or pelvis.  Tiny nonspecific lung nodules are unchanged.  No new lung nodules. May 2022, left knee replacement  INTERVAL HISTORY Matthew Woodard is a 55 y.o. male who has above history reviewed by me today presents for follow up visit for management of metastatic RCC. Denies nausea vomiting diarrhea.  He applies Clobetasol ointment topically on his palms for psoriasis and his symptoms are stable. He plans to have right knee replacement  next week.    Review of Systems  Constitutional:  Negative for appetite change, chills, fatigue, fever and unexpected weight change.  HENT:   Negative for hearing loss and voice change.   Eyes:  Negative for eye problems and icterus.  Respiratory:  Negative for chest tightness, cough and shortness of breath.   Cardiovascular:  Negative for chest pain and leg swelling.  Gastrointestinal:  Negative for abdominal distention, abdominal pain, blood in stool and nausea.  Endocrine: Negative for hot flashes.  Genitourinary:  Negative for difficulty urinating, dysuria and frequency.   Musculoskeletal:  Positive for arthralgias.  Skin:  Negative for itching and rash.  Neurological:  Negative for extremity weakness, headaches, light-headedness and numbness.  Hematological:  Negative for adenopathy. Does not bruise/bleed easily.  Psychiatric/Behavioral:  Negative for confusion.    MEDICAL HISTORY:  Past Medical History:  Diagnosis Date   Arthritis    hands, left knee   Cancer ([place])    Diabetes mellitus without complication ([place])    Eczema 01/08/2017   GERD (gastroesophageal reflux disease)    History of kidney cancer    Hyperlipidemia LDL goal <100 11/02/2014   Hypertension    Kidney stone    YEAR H/O HEMATURIA   Psoriasis    Wears dentures    full upper,  partial lower.  Has, does not wear    SURGICAL HISTORY: Past Surgical History:  Procedure Laterality Date   COLONOSCOPY WITH PROPOFOL N/A 02/23/2017   Procedure: COLONOSCOPY WITH PROPOFOL;  Surgeon: [name], MD;  Location: [facility];  Service: Endoscopy;  Laterality: N/A;  Diabetic - oral meds   CYST EXCISION  Aug. 2016   on back   CYSTOSCOPY W/ RETROGRADES Right 06/28/2018   Procedure: CYSTOSCOPY WITH RETROGRADE PYELOGRAM;  Surgeon: Billey Co, MD;  Location: [facility];  Service: Urology;  Laterality: Right;   CYSTOSCOPY W/ URETERAL STENT REMOVAL Right 07/22/2018   Procedure: CYSTOSCOPY WITH STENT REMOVAL;  Surgeon: Billey Co, MD;  Location: [facility];  Service: Urology;  Laterality: Right;   CYSTOSCOPY WITH BIOPSY Right 06/28/2018   Procedure: CYSTOSCOPY WITH RENAL BIOPSY;  Surgeon: Billey Co, MD;  Location: [facility];  Service: Urology;  Laterality: Right;   CYSTOSCOPY WITH URETEROSCOPY AND STENT PLACEMENT Right 06/28/2018   Procedure: CYSTOSCOPY WITH URETEROSCOPY AND STENT PLACEMENT;  Surgeon: Billey Co, MD;  Location: [facility];  Service: Urology;  Laterality: Right;   FULGURATION OF BLADDER TUMOR N/A 06/28/2018   Procedure: CYSTOSCOPY BLADDER BIOPSY, FULGERATION OF BLADDER;  Surgeon: Billey Co, MD;  Location: [facility];  Service: Urology;  Laterality: N/A;   KNEE SURGERY Left    LAPAROSCOPIC NEPHRECTOMY, HAND ASSISTED Right 07/22/2018   Procedure: HAND ASSISTED LAPAROSCOPIC NEPHRECTOMY;  Surgeon: Billey Co, MD;  Location: [facility];  Service: Urology;  Laterality: Right;   NASAL SINUS SURGERY  03/15   POLYPECTOMY N/A 02/23/2017  Procedure: POLYPECTOMY;  Surgeon: [name], MD;  Location: [facility];  Service: Endoscopy;  Laterality: N/A;   tooth abstraction     TRANSURETHRAL RESECTION OF BLADDER TUMOR N/A 05/12/2019   Procedure: TRANSURETHRAL RESECTION OF BLADDER TUMOR (TURBT);  Surgeon: Billey Co, MD;  Location: [facility];  Service: Urology;  Laterality: N/A;   VARICOCELE EXCISION     XI ROBOTIC ASSISTED VENTRAL HERNIA N/A 11/18/2020   Procedure: XI ROBOTIC ASSISTED VENTRAL HERNIA, incisional;  Surgeon: Jules Husbands, MD;  Location: [facility];  Service: General;  Laterality: N/A;    SOCIAL HISTORY: Social History   Socioeconomic History   Marital status: Married    Spouse name: Alma Friendly   Number of children: Not on file   Years of education: Not on file   Highest education level: Not on file  Occupational History   Not on file  Tobacco Use   Smoking status: Former    Types: Cigarettes    Quit date: 06/15/2013    Years since quitting: 7.6   Smokeless tobacco: Never  Vaping Use   Vaping Use: Never used  Substance and Sexual Activity   Alcohol use: No    Alcohol/week: 0.0 standard drinks   Drug use: No   Sexual activity: Yes  Other Topics Concern   Not on file  Social History Narrative   Lives with wife, sister in law   Social Determinants of Health   Financial Resource Strain: Not on file  Food Insecurity: Not on file  Transportation Needs: Not on file  Physical Activity: Not on file  Stress: Not on file  Social Connections: Not on file  Intimate Partner Violence: Not on file    FAMILY HISTORY: Family History  Problem Relation Age of Onset   Heart disease Father    Hypertension Father    COPD Father    Colon cancer Maternal Grandmother    Heart attack Paternal Grandfather     ALLERGIES:  is allergic to glucotrol [glipizide].  MEDICATIONS:  Current Outpatient Medications  Medication Sig Dispense Refill   aspirin 81 MG EC tablet Take 81 mg by mouth daily with supper.     clobetasol ointment (TEMOVATE) [id number] % Apply 1 application topically 2 (two) times daily as needed for rash.     cyclobenzaprine (FLEXERIL) 10 MG tablet Take 10 mg by mouth 3 (three) times daily as needed for muscle spasms.     Glucose Blood (ACCU-CHEK AVIVA PLUS VI) by In Vitro route.     JARDIANCE 10 MG TABS  tablet TAKE 1 TABLET BY MOUTH DAILY (Patient taking differently: Take 10 mg by mouth daily with supper.) 30 tablet 2   lisinopril (PRINIVIL,ZESTRIL) 10 MG tablet TAKE 1 TABLET(10 MG) BY MOUTH DAILY (Patient taking differently: Take 10 mg by mouth daily with supper.) 90 tablet 1   metFORMIN (GLUCOPHAGE-XR) 500 MG 24 hr tablet Take 3 pills by mouth daily for 4-5 days, then increase to 4 pills daily (if tolerated) (Patient taking differently: Take 1,000 mg by mouth daily with supper.) 360 tablet 0   Multiple Vitamin (MULTIVITAMIN) tablet Take 1 tablet by mouth daily with supper.     omeprazole (PRILOSEC) 20 MG capsule Take 20 mg by mouth daily as needed (hearburn).     OZEMPIC, 0.25 OR 0.5 MG/DOSE, 2 MG/1.5ML SOPN Inject 1 mg into the skin every Sunday.     rosuvastatin (CRESTOR) 20 MG tablet Take 20 mg by mouth daily with supper.     traZODone (  DESYREL) 50 MG tablet Take 1 tablet (50 mg total) by mouth at bedtime. (Patient taking differently: Take 50 mg by mouth at bedtime as needed for sleep.) 30 tablet 0   HYDROcodone-acetaminophen (NORCO/VICODIN) 5-325 MG tablet Take 1-2 tablets by mouth every 4 (four) hours as needed for moderate pain. (Patient not taking: Reported on 01/18/2021) 20 tablet 0   prochlorperazine (COMPAZINE) 10 MG tablet Take 1 tablet (10 mg total) by mouth every 6 (six) hours as needed for nausea or vomiting. (Patient not taking: Reported on 01/18/2021) 15 tablet 0   No current facility-administered medications for this visit.     PHYSICAL EXAMINATION: ECOG PERFORMANCE STATUS: 1 - Symptomatic but completely ambulatory Vitals:   01/18/21 0841  BP: 117/79  Pulse: 83  Temp: 98.2 F (36.8 C)  SpO2: 99%   Filed Weights   01/18/21 0841  Weight: 212 lb 1.6 oz (96.2 kg)    Physical Exam Constitutional:      General: He is not in acute distress.    Comments: Patient walks with a walker  HENT:     Head: Normocephalic and atraumatic.  Eyes:     General: No scleral icterus.     Pupils: Pupils are equal, round, and reactive to light.  Cardiovascular:     Rate and Rhythm: Normal rate and regular rhythm.     Heart sounds: Normal heart sounds.  Pulmonary:     Effort: Pulmonary effort is normal. No respiratory distress.     Breath sounds: No wheezing.  Abdominal:     General: Bowel sounds are normal. There is no distension.     Palpations: Abdomen is soft. There is no mass.     Tenderness: There is no abdominal tenderness.     Comments: Hernia repairment surgery  Musculoskeletal:        General: No deformity. Normal range of motion.     Cervical back: Normal range of motion and neck supple.     Comments: Left knee status post knee replacement.   Skin:    General: Skin is warm and dry.     Findings: No erythema or rash.  Neurological:     Mental Status: He is alert and oriented to person, place, and time. Mental status is at baseline.     Cranial Nerves: No cranial nerve deficit.     Coordination: Coordination normal.  Psychiatric:        Mood and Affect: Mood normal.    LABORATORY DATA:  I have reviewed the data as listed Lab Results  Component Value Date   WBC 7.2 01/18/2021   HGB 14.2 01/18/2021   HCT 42.4 01/18/2021   MCV 89.3 01/18/2021   PLT 225 01/18/2021   Recent Labs    01/26/20 0829 02/12/20 1051 02/19/20 1115 02/27/20 1055 03/16/20 0830 12/07/20 0815 12/28/20 0821 01/18/21 0824  NA 135  --   --   --    < > 137 135 135  K 4.6  --   --   --    < > 4.5 4.3 4.3  CL 102  --   --   --    < > 101 101 102  CO2 24  --   --   --    < > '26 25 25  '$ GLUCOSE 96  --   --   --    < > 159* 156* 148*  BUN 23*  --   --   --    < > 23* 19  25*  CREATININE 1.10  --   --   --    < > 1.05 1.10 1.06  CALCIUM 9.2  --   --   --    < > 9.8 9.4 9.2  GFRNONAA >60  --   --   --    < > >60 >60 >60  GFRAA >60  --   --   --   --   --   --   --   PROT 6.6 6.7 6.8 6.9   < > 7.4 7.2 7.5  ALBUMIN 3.9 4.1 4.0 4.2   < > 4.3 4.3 4.3  AST 183* 130* 99* 67*   < > '22 24  20  '$ ALT 336* 266* 203* 127*   < > '18 20 18  '$ ALKPHOS 60 66 64 57   < > 76 73 67  BILITOT 0.9 1.2 1.1 1.0   < > 0.5 0.6 0.4  BILIDIR  --  0.3* 0.2 0.2  --   --   --   --   IBILI  --  0.9 0.9 0.8  --   --   --   --    < > = values in this interval not displayed.    Iron/TIBC/Ferritin/ %Sat    Component Value Date/Time   IRON 118 09/25/2019 1115   TIBC 454 (H) 09/25/2019 1115   FERRITIN 236 09/25/2019 1115   IRONPCTSAT 26 09/25/2019 1115      RADIOGRAPHIC STUDIES: I have personally reviewed the radiological images as listed and agreed with the findings in the report.  CT CHEST ABDOMEN PELVIS WO CONTRAST  Result Date: 01/15/2021 CLINICAL DATA:  History of metastatic renal cell carcinoma, total nephrectomy 08/02/2018 and Keytruda immunotherapy infusions, restaging/follow-up. EXAM: CT CHEST, ABDOMEN AND PELVIS WITHOUT CONTRAST TECHNIQUE: Multidetector CT imaging of the chest, abdomen and pelvis was performed following the standard protocol without IV contrast. COMPARISON:  Multiple priors including most recent CT Sep 29, 2020 FINDINGS: CT CHEST FINDINGS Cardiovascular: Aortic atherosclerosis without aneurysmal dilation. Normal caliber central pulmonary arteries. Normal size heart. No significant pericardial effusion/thickening. Coronary artery calcifications. Mediastinum/Nodes: No discrete thyroid nodule. No pathologically enlarged mediastinal, hilar or axillary lymph nodes, noting limited sensitivity for the detection of hilar adenopathy on this noncontrast study. Lungs/Pleura: Stable indexed 4 mm left upper lobe pulmonary nodule on image 49/3. The additional milli metric scattered pulmonary nodules appears similar prior. No new suspicious pulmonary nodules or masses. No focal airspace consolidation. No pleural effusion. Pneumothorax. Musculoskeletal: No aggressive lytic or blastic of bone. No suspicious chest wall mass. CT ABDOMEN PELVIS FINDINGS Hepatobiliary: Unremarkable noncontrast appearance of  the hepatic parenchyma. Gallbladder is unremarkable. No biliary ductal dilation. Pancreas: Within normal limits. Spleen: Within normal limits. Adrenals/Urinary Tract: Bilateral adrenal glands are unremarkable. Prior right nephrectomy without new suspicious soft tissue nodularity in the nephrectomy bed. No left hydronephrosis or nephrolithiasis. Stable hypodense 1.2 cm cortical lesion in the interpolar region left kidney on image 73/2, consistent with a cyst. Urinary bladder is unremarkable for degree of distension. Stomach/Bowel: Renal pain enteric contrast traverses distal small bowel. Stomach is unremarkable for degree distension. No pathologic dilation of small large bowel. The appendix and terminal ileum appear unremarkable. No evidence of acute bowel inflammation. Vascular/Lymphatic: Aortic and branch vessel atherosclerosis without abdominal aortic aneurysm. No pathologically enlarged abdominopelvic lymph nodes. Reproductive: Prostate unremarkable. Other: No abdominopelvic ascites. Musculoskeletal: No aggressive lytic or blastic lesion of bone. No acute osseous abnormality. IMPRESSION: 1. Stable exam. No evidence of recurrent  or new/progressive metastatic disease within the chest, abdomen, or pelvis. 2. Stable tiny bilateral pulmonary nodules measuring up to 4 mm. 3.  Aortic Atherosclerosis (ICD10-I70.0). Electronically Signed   By: Dahlia Bailiff M.D.   On: 01/15/2021 21:38       ASSESSMENT & PLAN:  1. Clear cell carcinoma of right kidney ([place])   2. Encounter for antineoplastic immunotherapy   3. 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 Labs are reviewed and discussed with patient. 01/14/2021 CT chest abdomen pelvis showed stable exam, no new or progressive metastatic disease within chest abdomen pelvis. Stable lung nodules. Aortic atherosclerosis.  Image was reviewed by me and discussed with patient.  Proceed with Keytruda today.    Recent  history of knee replacement.  Follow-up with orthopedic surgeon.  He is on aspirin 81 mg currently for DVT prophylaxis.  Abdominal wall hernia, status post a repair. Skin rash, likely secondary to immunotherapy dermatitis.  Continue topical steroid cream PRN. Symptoms are controlled.  History of psoriasis, on topical steroid.  Symptoms are stable.  Continue monitor.  Follow-up in 3 weeks We spent sufficient time to discuss many aspect of care, questions were answered to patient's satisfaction. All questions were answered. The patient knows to call the clinic with any problems questions or concerns.  Earlie Server, MD, PhD Hematology Oncology [facility] at [facility] Pager- [phone] 01/18/2021

## 2021-01-18 NOTE — Patient Instructions (Signed)
[facility] [place] [facility]  Discharge Instructions: Thank you for choosing [facility] to provide your oncology and hematology care.  If you have a lab appointment with the Cancer Center, please go directly to the Cancer Center and check in at the registration area.  Wear comfortable clothing and clothing appropriate for easy access to any Portacath or PICC line.   We strive to give you quality time with your provider. You may need to reschedule your appointment if you arrive late (15 or more minutes).  Arriving late affects you and other patients whose appointments are after yours.  Also, if you miss three or more appointments without notifying the office, you may be dismissed from the clinic at the provider's discretion.      For prescription refill requests, have your pharmacy contact our office and allow 72 hours for refills to be completed.    Today you received the following chemotherapy and/or immunotherapy agents : Keytruda    To help prevent nausea and vomiting after your treatment, we encourage you to take your nausea medication as directed.  BELOW ARE SYMPTOMS THAT SHOULD BE REPORTED IMMEDIATELY: *FEVER GREATER THAN 100.4 F (38 C) OR HIGHER *CHILLS OR SWEATING *NAUSEA AND VOMITING THAT IS NOT CONTROLLED WITH YOUR NAUSEA MEDICATION *UNUSUAL SHORTNESS OF BREATH *UNUSUAL BRUISING OR BLEEDING *URINARY PROBLEMS (pain or burning when urinating, or frequent urination) *BOWEL PROBLEMS (unusual diarrhea, constipation, pain near the anus) TENDERNESS IN MOUTH AND THROAT WITH OR WITHOUT PRESENCE OF ULCERS (sore throat, sores in mouth, or a toothache) UNUSUAL RASH, SWELLING OR PAIN  UNUSUAL VAGINAL DISCHARGE OR ITCHING   Items with * indicate a potential emergency and should be followed up as soon as possible or go to the Emergency Department if any problems should occur.  Please show the CHEMOTHERAPY ALERT CARD or IMMUNOTHERAPY ALERT CARD at check-in  to the Emergency Department and triage nurse.  Should you have questions after your visit or need to cancel or reschedule your appointment, please contact [facility] [place] [facility]  [phone] and follow the prompts.  Office hours are 8:00 a.m. to 4:30 p.m. Monday - Friday. Please note that voicemails left after 4:00 p.m. may not be returned until the following business day.  We are closed weekends and major holidays. You have access to a nurse at all times for urgent questions. Please call the main number to the clinic [phone] and follow the prompts.  For any non-urgent questions, you may also contact your provider using MyChart. We now offer e-Visits for anyone 18 and older to request care online for non-urgent symptoms. For details visit [email or web address].   Also download the MyChart app! Go to the app store, search "MyChart", open the app, select [facility], and log in with your MyChart username and password.  Due to Covid, a mask is required upon entering the hospital/clinic. If you do not have a mask, one will be given to you upon arrival. For doctor visits, patients may have 1 support person aged 18 or older with them. For treatment visits, patients cannot have anyone with them due to current Covid guidelines and our immunocompromised population.  

## 2021-02-03 ENCOUNTER — Encounter: Payer: Self-pay | Admitting: Oncology

## 2021-02-03 NOTE — Telephone Encounter (Signed)
Aby please 9/27 appts out a week and notify pt of new appt. Thanks

## 2021-02-08 ENCOUNTER — Inpatient Hospital Stay: Payer: BC Managed Care – PPO

## 2021-02-08 ENCOUNTER — Inpatient Hospital Stay: Payer: BC Managed Care – PPO | Admitting: Oncology

## 2021-02-15 ENCOUNTER — Encounter: Payer: Self-pay | Admitting: Oncology

## 2021-02-15 ENCOUNTER — Inpatient Hospital Stay: Payer: BC Managed Care – PPO | Attending: Oncology

## 2021-02-15 ENCOUNTER — Inpatient Hospital Stay: Payer: BC Managed Care – PPO

## 2021-02-15 ENCOUNTER — Inpatient Hospital Stay (HOSPITAL_BASED_OUTPATIENT_CLINIC_OR_DEPARTMENT_OTHER): Payer: BC Managed Care – PPO | Admitting: Oncology

## 2021-02-15 VITALS — BP 128/86 | HR 106 | Temp 97.5°F | Resp 18 | Wt 200.1 lb

## 2021-02-15 DIAGNOSIS — C641 Malignant neoplasm of right kidney, except renal pelvis: Secondary | ICD-10-CM

## 2021-02-15 DIAGNOSIS — Z96652 Presence of left artificial knee joint: Secondary | ICD-10-CM

## 2021-02-15 DIAGNOSIS — C7800 Secondary malignant neoplasm of unspecified lung: Secondary | ICD-10-CM | POA: Diagnosis not present

## 2021-02-15 DIAGNOSIS — Z905 Acquired absence of kidney: Secondary | ICD-10-CM | POA: Diagnosis not present

## 2021-02-15 DIAGNOSIS — Z96653 Presence of artificial knee joint, bilateral: Secondary | ICD-10-CM | POA: Insufficient documentation

## 2021-02-15 DIAGNOSIS — Z5112 Encounter for antineoplastic immunotherapy: Secondary | ICD-10-CM | POA: Diagnosis present

## 2021-02-15 DIAGNOSIS — R000 Tachycardia, unspecified: Secondary | ICD-10-CM

## 2021-02-15 DIAGNOSIS — R21 Rash and other nonspecific skin eruption: Secondary | ICD-10-CM

## 2021-02-15 DIAGNOSIS — C7911 Secondary malignant neoplasm of bladder: Secondary | ICD-10-CM | POA: Insufficient documentation

## 2021-02-15 LAB — CBC WITH DIFFERENTIAL/PLATELET
Abs Immature Granulocytes: 0.15 10*3/uL — ABNORMAL HIGH (ref 0.00–0.07)
Basophils Absolute: 0.1 10*3/uL (ref 0.0–0.1)
Basophils Relative: 1 %
Eosinophils Absolute: 0.3 10*3/uL (ref 0.0–0.5)
Eosinophils Relative: 3 %
HCT: 38.2 % — ABNORMAL LOW (ref 39.0–52.0)
Hemoglobin: 12.5 g/dL — ABNORMAL LOW (ref 13.0–17.0)
Immature Granulocytes: 1 %
Lymphocytes Relative: 24 %
Lymphs Abs: 2.6 10*3/uL (ref 0.7–4.0)
MCH: 29.2 pg (ref 26.0–34.0)
MCHC: 32.7 g/dL (ref 30.0–36.0)
MCV: 89.3 fL (ref 80.0–100.0)
Monocytes Absolute: 0.8 10*3/uL (ref 0.1–1.0)
Monocytes Relative: 7 %
Neutro Abs: 7 10*3/uL (ref 1.7–7.7)
Neutrophils Relative %: 64 %
Platelets: 652 10*3/uL — ABNORMAL HIGH (ref 150–400)
RBC: 4.28 MIL/uL (ref 4.22–5.81)
RDW: 12.5 % (ref 11.5–15.5)
WBC: 10.8 10*3/uL — ABNORMAL HIGH (ref 4.0–10.5)
nRBC: 0 % (ref 0.0–0.2)

## 2021-02-15 LAB — COMPREHENSIVE METABOLIC PANEL
ALT: 15 U/L (ref 0–44)
AST: 21 U/L (ref 15–41)
Albumin: 4 g/dL (ref 3.5–5.0)
Alkaline Phosphatase: 108 U/L (ref 38–126)
Anion gap: 7 (ref 5–15)
BUN: 21 mg/dL — ABNORMAL HIGH (ref 6–20)
CO2: 27 mmol/L (ref 22–32)
Calcium: 9.6 mg/dL (ref 8.9–10.3)
Chloride: 97 mmol/L — ABNORMAL LOW (ref 98–111)
Creatinine, Ser: 1.04 mg/dL (ref 0.61–1.24)
GFR, Estimated: >60 mL/min (ref >60)
Glucose, Bld: 221 mg/dL — ABNORMAL HIGH (ref 70–99)
Potassium: 4.3 mmol/L (ref 3.5–5.1)
Sodium: 131 mmol/L — ABNORMAL LOW (ref 135–145)
Total Bilirubin: 0.5 mg/dL (ref 0.3–1.2)
Total Protein: 8.1 g/dL (ref 6.5–8.1)

## 2021-02-15 LAB — TSH: TSH: 1.29 u[IU]/mL (ref 0.350–4.500)

## 2021-02-15 LAB — T4, FREE: Free T4: 1.04 ng/dL (ref 0.61–1.12)

## 2021-02-15 MED ORDER — SODIUM CHLORIDE 0.9 % IV SOLN
200.0000 mg | Freq: Once | INTRAVENOUS | Status: AC
  Administered 2021-02-15: 200 mg via INTRAVENOUS
  Filled 2021-02-15: qty 8

## 2021-02-15 MED ORDER — SODIUM CHLORIDE 0.9 % IV SOLN
Freq: Once | INTRAVENOUS | Status: AC
  Filled 2021-02-15: qty 250

## 2021-02-15 NOTE — Progress Notes (Signed)
Hematology/Oncology follow up note [facility] [phone] [phone]   Patient Care Team: Danae Orleans, MD as PCP - General (Internal Medicine) [name], [name], MD as PCP - Family Medicine (Family Medicine) [name], MD as Consulting Physician (General Surgery) [name], [name], MD as Attending Physician (Family Medicine) [name], [name], MD as Consulting Physician (Dermatology)  REFERRING PROVIDER: Danae Orleans, MD  CHIEF COMPLAINTS/REASON FOR VISIT:  Follow-up of kidney cancer  HISTORY OF PRESENTING ILLNESS:   Matthew Woodard is a  55 y.o.  male with PMH listed below was seen in consultation at the request of  Danae Orleans, MD  for evaluation of kidney cancer Patient's cancer history dated back to February 2020 when he developed gross hematuria with small clots and right-sided flank pain.  Patient had a CT urogram done which showed right 4 cm central enhancing renal mass with invasion into the collecting system.  X-ray of chest was performed to see complete staging which showed no concerning findings. 06/28/2018 patient underwent a cystoscopy, bladder biopsy and a right retrograde pyelogram with intraoperative interpretation, right diagnostic ureteroscopy, right renal pelvis biopsy, right ureteral stent placement.  biopsy showed atypical cell clusters, with extensive crush artifact, nondiagnostic.   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  Surgical margins negative for tumor.pT3a,Nx Patient has been on surveillance after surgery. 10/28/2018 CT abdomen pelvis with contrast showed minimal fluid and or postoperative changes within the right renal fossa.  No evidence of metastatic disease or recurrence.  Patient has left kidney lesion previously  characterized as nonenhancing cyst by multi phasic contrast-enhanced CT. Patient reports an episode of gross hematuria in early December 2020, patient is due for 92-month surveillance CT scan. 04/30/2019 CT abdomen pelvis with contrast showed high attenuation mass at the right uretero vesicle junction, with extension into the bladder.  Bilateral pulmonary nodules, most indicated for metastatic disease.   Patient underwent cystoscopy and transurethral resection of irregular bladder tumor 3 cm.  Mass appeared to emanate from the right ureteral orifice.  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 Patient was referred to me for further work-up and management.  #Staging chest CT with contrast showed numerous bilateral pulmonary nodules.  Compatible with metastatic disease. Bone scan showed no osseous metastatic disease.   #History of tobacco abuse, former smoker.  Quit in 2015.  He denies any hemoptysis, chest pain, shortness of breath today. Patient reports that hematuria has completely resolved.  Denies any pain today. He is accompanied by his wife.  #History of cutaneous psoriasis, mostly on his right hand, currently on weekly methotrexate 12.5 mg with good symptom control. #NGS: [facility] PD L1 TPS 1%  # patient was referred to ENT for evaluation of headache and sinus pressure.  He had a ENT evaluation and feels that patient does not have sinusitis.  [name] recommended MRI brain to rule out PRES syndrome.  Axitinib has been held MRI brain was done and was negative.   #09/2019 off axitinib and Keytruda since the beginning of May due to transaminitis. Patient was seen and evaluated by gastroenterology [name]. Work-up was negative. Ultrasound liver vascular Doppler is negative. Recommend observation. #11/24/2019, resumed on Keytruda every 3 weeks #01/05/2020, axitinib was resumed 3 mg #01/20/2020, axitinib was discontinued due to recurrence of  transaminitis  Beryle Flock was temporarily held. #  03/16/2020, resumed on Keytruda every 3 weeks. 09/29/2020 CT scan showed no evidence of local recurrence or new/progressive disease in the chest abdomen or pelvis.  Tiny nonspecific lung nodules are unchanged.  No new lung nodules. May 2022, left knee replacement 01/14/2021 CT chest abdomen pelvis showed stable exam, no new or progressive metastatic disease within chest abdomen pelvis. Stable lung nodules. Aortic atherosclerosis.  Sept 2022 right knee replacement  INTERVAL HISTORY Matthew Woodard is a 55 y.o. male who has above history reviewed by me today presents for follow up visit for management of metastatic RCC. During the interval patient underwent right knee replacement. He has post-operation pain.  Heart rate is high today in the clinic due to pain.  No fever, chills, nausea, vomiting,    Review of Systems  Constitutional:  Negative for appetite change, chills, fatigue, fever and unexpected weight change.  HENT:   Negative for hearing loss and voice change.   Eyes:  Negative for eye problems and icterus.  Respiratory:  Negative for chest tightness, cough and shortness of breath.   Cardiovascular:  Negative for chest pain and leg swelling.  Gastrointestinal:  Negative for abdominal distention, abdominal pain, blood in stool and nausea.  Endocrine: Negative for hot flashes.  Genitourinary:  Negative for difficulty urinating, dysuria and frequency.   Musculoskeletal:  Positive for arthralgias.       Right knee replacement  Skin:  Negative for itching and rash.  Neurological:  Negative for extremity weakness, headaches, light-headedness and numbness.  Hematological:  Negative for adenopathy. Does not bruise/bleed easily.  Psychiatric/Behavioral:  Negative for confusion.    MEDICAL HISTORY:  Past Medical History:  Diagnosis Date   Arthritis    hands, left knee   Cancer ([place])    Diabetes mellitus without complication ([place])     Eczema 01/08/2017   GERD (gastroesophageal reflux disease)    History of kidney cancer    Hyperlipidemia LDL goal <100 11/02/2014   Hypertension    Kidney stone    YEAR H/O HEMATURIA   Psoriasis    Wears dentures    full upper, partial lower.  Has, does not wear    SURGICAL HISTORY: Past Surgical History:  Procedure Laterality Date   COLONOSCOPY WITH PROPOFOL N/A 02/23/2017   Procedure: COLONOSCOPY WITH PROPOFOL;  Surgeon: [name], MD;  Location: [place];  Service: Endoscopy;  Laterality: N/A;  Diabetic - oral meds   CYST EXCISION  Aug. 2016   on back   CYSTOSCOPY W/ RETROGRADES Right 06/28/2018   Procedure: CYSTOSCOPY WITH RETROGRADE PYELOGRAM;  Surgeon: Billey Co, MD;  Location: [facility];  Service: Urology;  Laterality: Right;   CYSTOSCOPY W/ URETERAL STENT REMOVAL Right 07/22/2018   Procedure: CYSTOSCOPY WITH STENT REMOVAL;  Surgeon: Billey Co, MD;  Location: [facility];  Service: Urology;  Laterality: Right;   CYSTOSCOPY WITH BIOPSY Right 06/28/2018   Procedure: CYSTOSCOPY WITH RENAL BIOPSY;  Surgeon: Billey Co, MD;  Location: [facility];  Service: Urology;  Laterality: Right;   CYSTOSCOPY WITH URETEROSCOPY AND STENT PLACEMENT Right 06/28/2018   Procedure: CYSTOSCOPY WITH URETEROSCOPY AND STENT PLACEMENT;  Surgeon: Billey Co, MD;  Location: [facility];  Service: Urology;  Laterality: Right;   FULGURATION OF BLADDER TUMOR N/A 06/28/2018   Procedure: CYSTOSCOPY BLADDER BIOPSY, FULGERATION OF BLADDER;  Surgeon: Billey Co, MD;  Location: [facility];  Service: Urology;  Laterality: N/A;   KNEE SURGERY Left    LAPAROSCOPIC NEPHRECTOMY, HAND ASSISTED Right  07/22/2018   Procedure: HAND ASSISTED LAPAROSCOPIC NEPHRECTOMY;  Surgeon: Billey Co, MD;  Location: [facility];  Service: Urology;  Laterality: Right;   NASAL SINUS SURGERY  03/15   POLYPECTOMY N/A 02/23/2017   Procedure: POLYPECTOMY;  Surgeon: [name], MD;  Location: [place];   Service: Endoscopy;  Laterality: N/A;   tooth abstraction     TRANSURETHRAL RESECTION OF BLADDER TUMOR N/A 05/12/2019   Procedure: TRANSURETHRAL RESECTION OF BLADDER TUMOR (TURBT);  Surgeon: Billey Co, MD;  Location: [facility];  Service: Urology;  Laterality: N/A;   VARICOCELE EXCISION     XI ROBOTIC ASSISTED VENTRAL HERNIA N/A 11/18/2020   Procedure: XI ROBOTIC ASSISTED VENTRAL HERNIA, incisional;  Surgeon: Jules Husbands, MD;  Location: [facility];  Service: General;  Laterality: N/A;    SOCIAL HISTORY: Social History   Socioeconomic History   Marital status: Married    Spouse name: Alma Friendly   Number of children: Not on file   Years of education: Not on file   Highest education level: Not on file  Occupational History   Not on file  Tobacco Use   Smoking status: Former    Types: Cigarettes    Quit date: 06/15/2013    Years since quitting: 7.6   Smokeless tobacco: Never  Vaping Use   Vaping Use: Never used  Substance and Sexual Activity   Alcohol use: No    Alcohol/week: 0.0 standard drinks   Drug use: No   Sexual activity: Yes  Other Topics Concern   Not on file  Social History Narrative   Lives with wife, sister in law   Social Determinants of Health   Financial Resource Strain: Not on file  Food Insecurity: Not on file  Transportation Needs: Not on file  Physical Activity: Not on file  Stress: Not on file  Social Connections: Not on file  Intimate Partner Violence: Not on file    FAMILY HISTORY: Family History  Problem Relation Age of Onset   Heart disease Father    Hypertension Father    COPD Father    Colon cancer Maternal Grandmother    Heart attack Paternal Grandfather     ALLERGIES:  is allergic to glucotrol [glipizide].  MEDICATIONS:  Current Outpatient Medications  Medication Sig Dispense Refill   aspirin 81 MG EC tablet Take 81 mg by mouth daily with supper.     clobetasol ointment (TEMOVATE) 1.61 % Apply 1 application topically 2 (two) times  daily as needed for rash.     cyclobenzaprine (FLEXERIL) 10 MG tablet Take 10 mg by mouth 3 (three) times daily as needed for muscle spasms.     Glucose Blood (ACCU-CHEK AVIVA PLUS VI) by In Vitro route.     JARDIANCE 10 MG TABS tablet TAKE 1 TABLET BY MOUTH DAILY (Patient taking differently: Take 10 mg by mouth daily with supper.) 30 tablet 2   lisinopril (PRINIVIL,ZESTRIL) 10 MG tablet TAKE 1 TABLET(10 MG) BY MOUTH DAILY (Patient taking differently: Take 10 mg by mouth daily with supper.) 90 tablet 1   metFORMIN (GLUCOPHAGE-XR) 500 MG 24 hr tablet Take 3 pills by mouth daily for 4-5 days, then increase to 4 pills daily (if tolerated) (Patient taking differently: Take 1,000 mg by mouth daily with supper.) 360 tablet 0   Multiple Vitamin (MULTIVITAMIN) tablet Take 1 tablet by mouth daily with supper.     omeprazole (PRILOSEC) 20 MG capsule Take 20 mg by mouth daily as needed (he[place]).  Oxycodone HCl 10 MG TABS Take 10 mg by mouth every 4 (four) hours.     OZEMPIC, 0.25 OR 0.5 MG/DOSE, 2 MG/1.5ML SOPN Inject 1 mg into the skin every Sunday.     rosuvastatin (CRESTOR) 20 MG tablet Take 20 mg by mouth daily with supper.     traZODone (DESYREL) 50 MG tablet Take 1 tablet (50 mg total) by mouth at bedtime. (Patient taking differently: Take 50 mg by mouth at bedtime as needed for sleep.) 30 tablet 0   HYDROcodone-acetaminophen (NORCO/VICODIN) 5-325 MG tablet Take 1-2 tablets by mouth every 4 (four) hours as needed for moderate pain. (Patient not taking: Reported on 02/15/2021) 20 tablet 0   prochlorperazine (COMPAZINE) 10 MG tablet Take 1 tablet (10 mg total) by mouth every 6 (six) hours as needed for nausea or vomiting. (Patient not taking: No sig reported) 15 tablet 0   No current facility-administered medications for this visit.     PHYSICAL EXAMINATION: ECOG PERFORMANCE STATUS: 1 - Symptomatic but completely ambulatory Vitals:   02/15/21 0923  BP: 128/86  Pulse: (!) 106  Resp: 18  Temp:  (!) 97.5 F (36.4 C)  SpO2: 98%   Filed Weights   02/15/21 0923  Weight: 200 lb 1.6 oz (90.8 kg)    Physical Exam Constitutional:      General: He is not in acute distress.    Comments: Patient walks with a walker  HENT:     Head: Normocephalic and atraumatic.  Eyes:     General: No scleral icterus.    Pupils: Pupils are equal, round, and reactive to light.  Cardiovascular:     Rate and Rhythm: Regular rhythm. Tachycardia present.     Heart sounds: Normal heart sounds.  Pulmonary:     Effort: Pulmonary effort is normal. No respiratory distress.     Breath sounds: No wheezing.  Abdominal:     General: Bowel sounds are normal. There is no distension.     Palpations: Abdomen is soft. There is no mass.     Tenderness: There is no abdominal tenderness.  Musculoskeletal:        General: No deformity. Normal range of motion.     Cervical back: Normal range of motion and neck supple.     Comments: Right knee status post knee replacement.   Skin:    General: Skin is warm and dry.     Findings: No erythema or rash.  Neurological:     Mental Status: He is alert and oriented to person, place, and time. Mental status is at baseline.     Cranial Nerves: No cranial nerve deficit.     Coordination: Coordination normal.  Psychiatric:        Mood and Affect: Mood normal.    LABORATORY DATA:  I have reviewed the data as listed Lab Results  Component Value Date   WBC 10.8 (H) 02/15/2021   HGB 12.5 (L) 02/15/2021   HCT 38.2 (L) 02/15/2021   MCV 89.3 02/15/2021   PLT 652 (H) 02/15/2021   Recent Labs    02/19/20 1115 02/27/20 1055 03/16/20 0830 12/28/20 0821 01/18/21 0824 02/15/21 0841  NA  --   --    < > 135 135 131*  K  --   --    < > 4.3 4.3 4.3  CL  --   --    < > 101 102 97*  CO2  --   --    < > 25 25 27  GLUCOSE  --   --    < > 156* 148* 221*  BUN  --   --    < > 19 25* 21*  CREATININE  --   --    < > 1.10 1.06 1.04  CALCIUM  --   --    < > 9.4 9.2 9.6  GFRNONAA   --   --    < > >60 >60 >60  PROT 6.8 6.9   < > 7.2 7.5 8.1  ALBUMIN 4.0 4.2   < > 4.3 4.3 4.0  AST 99* 67*   < > 24 20 21   ALT 203* 127*   < > 20 18 15   ALKPHOS 64 57   < > 73 67 108  BILITOT 1.1 1.0   < > 0.6 0.4 0.5  BILIDIR 0.2 0.2  --   --   --   --   IBILI 0.9 0.8  --   --   --   --    < > = values in this interval not displayed.    Iron/TIBC/Ferritin/ %Sat    Component Value Date/Time   IRON 118 09/25/2019 1115   TIBC 454 (H) 09/25/2019 1115   FERRITIN 236 09/25/2019 1115   IRONPCTSAT 26 09/25/2019 1115      RADIOGRAPHIC STUDIES: I have personally reviewed the radiological images as listed and agreed with the findings in the report.  CT CHEST ABDOMEN PELVIS WO CONTRAST  Result Date: 01/15/2021 CLINICAL DATA:  History of metastatic renal cell carcinoma, total nephrectomy 08/02/2018 and Keytruda immunotherapy infusions, restaging/follow-up. EXAM: CT CHEST, ABDOMEN AND PELVIS WITHOUT CONTRAST TECHNIQUE: Multidetector CT imaging of the chest, abdomen and pelvis was performed following the standard protocol without IV contrast. COMPARISON:  Multiple priors including most recent CT Sep 29, 2020 FINDINGS: CT CHEST FINDINGS Cardiovascular: Aortic atherosclerosis without aneurysmal dilation. Normal caliber central pulmonary arteries. Normal size heart. No significant pericardial effusion/thickening. Coronary artery calcifications. Mediastinum/Nodes: No discrete thyroid nodule. No pathologically enlarged mediastinal, hilar or axillary lymph nodes, noting limited sensitivity for the detection of hilar adenopathy on this noncontrast study. Lungs/Pleura: Stable indexed 4 mm left upper lobe pulmonary nodule on image 49/3. The additional milli metric scattered pulmonary nodules appears similar prior. No new suspicious pulmonary nodules or masses. No focal airspace consolidation. No pleural effusion. Pneumothorax. Musculoskeletal: No aggressive lytic or blastic of bone. No suspicious chest wall mass.  CT ABDOMEN PELVIS FINDINGS Hepatobiliary: Unremarkable noncontrast appearance of the hepatic parenchyma. Gallbladder is unremarkable. No biliary ductal dilation. Pancreas: Within normal limits. Spleen: Within normal limits. Adrenals/Urinary Tract: Bilateral adrenal glands are unremarkable. Prior right nephrectomy without new suspicious soft tissue nodularity in the nephrectomy bed. No left hydronephrosis or nephrolithiasis. Stable hypodense 1.2 cm cortical lesion in the interpolar region left kidney on image 73/2, consistent with a cyst. Urinary bladder is unremarkable for degree of distension. Stomach/Bowel: Renal pain enteric contrast traverses distal small bowel. Stomach is unremarkable for degree distension. No pathologic dilation of small large bowel. The appendix and terminal ileum appear unremarkable. No evidence of acute bowel inflammation. Vascular/Lymphatic: Aortic and branch vessel atherosclerosis without abdominal aortic aneurysm. No pathologically enlarged abdominopelvic lymph nodes. Reproductive: Prostate unremarkable. Other: No abdominopelvic ascites. Musculoskeletal: No aggressive lytic or blastic lesion of bone. No acute osseous abnormality. IMPRESSION: 1. Stable exam. No evidence of recurrent or new/progressive metastatic disease within the chest, abdomen, or pelvis. 2. Stable tiny bilateral pulmonary nodules measuring up to 4 mm. 3.  Aortic Atherosclerosis (ICD10-I70.0). Electronically Signed  By: Dahlia Bailiff M.D.   On: 01/15/2021 21:38       ASSESSMENT & PLAN:  1. Clear cell carcinoma of right kidney ([place])   2. Encounter for antineoplastic immunotherapy   3. History of left knee replacement   4. 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 Labs are reviewed and discussed with patient. Proceed with Keytruda.   Recent history of knee replacement.  Follow-up with orthopedic surgeon.  He is on aspirin 81 mg currently for DVT  prophylaxis.  Tachycardia is likely due to pain. HR came down to 106   Abdominal wall hernia, status post a repair.  Follow-up in 3 weeks All questions were answered. The patient knows to call the clinic with any problems questions or concerns.  Earlie Server, MD, PhD 02/15/2021

## 2021-02-15 NOTE — Progress Notes (Signed)
Per Dr. Tasia Catchings, ok to treat with a HR of 106 today.

## 2021-02-15 NOTE — Progress Notes (Signed)
Pt here for follow up. No new concerns voiced.   

## 2021-02-16 ENCOUNTER — Telehealth: Payer: Self-pay

## 2021-02-16 NOTE — Telephone Encounter (Signed)
Medical questionnaire forms completed and faxed to [facility] for dates 04/14/21 - 10/12/21

## 2021-02-22 ENCOUNTER — Other Ambulatory Visit: Payer: BC Managed Care – PPO

## 2021-02-22 ENCOUNTER — Ambulatory Visit: Payer: BC Managed Care – PPO | Admitting: Oncology

## 2021-02-22 ENCOUNTER — Ambulatory Visit: Payer: BC Managed Care – PPO

## 2021-02-23 ENCOUNTER — Other Ambulatory Visit: Payer: Self-pay | Admitting: Oncology

## 2021-03-08 ENCOUNTER — Other Ambulatory Visit: Payer: Self-pay

## 2021-03-08 ENCOUNTER — Inpatient Hospital Stay: Payer: BC Managed Care – PPO

## 2021-03-08 ENCOUNTER — Encounter: Payer: Self-pay | Admitting: Oncology

## 2021-03-08 ENCOUNTER — Inpatient Hospital Stay (HOSPITAL_BASED_OUTPATIENT_CLINIC_OR_DEPARTMENT_OTHER): Payer: BC Managed Care – PPO | Admitting: Oncology

## 2021-03-08 VITALS — BP 149/86 | HR 114 | Temp 97.3°F | Resp 18 | Wt 199.2 lb

## 2021-03-08 DIAGNOSIS — C641 Malignant neoplasm of right kidney, except renal pelvis: Secondary | ICD-10-CM | POA: Diagnosis not present

## 2021-03-08 DIAGNOSIS — R000 Tachycardia, unspecified: Secondary | ICD-10-CM | POA: Diagnosis not present

## 2021-03-08 DIAGNOSIS — Z96652 Presence of left artificial knee joint: Secondary | ICD-10-CM

## 2021-03-08 DIAGNOSIS — R21 Rash and other nonspecific skin eruption: Secondary | ICD-10-CM

## 2021-03-08 DIAGNOSIS — Z5112 Encounter for antineoplastic immunotherapy: Secondary | ICD-10-CM

## 2021-03-08 LAB — CBC WITH DIFFERENTIAL/PLATELET
Abs Immature Granulocytes: 0.04 10*3/uL (ref 0.00–0.07)
Basophils Absolute: 0 10*3/uL (ref 0.0–0.1)
Basophils Relative: 0 %
Eosinophils Absolute: 0.2 10*3/uL (ref 0.0–0.5)
Eosinophils Relative: 2 %
HCT: 39.8 % (ref 39.0–52.0)
Hemoglobin: 13 g/dL (ref 13.0–17.0)
Immature Granulocytes: 0 %
Lymphocytes Relative: 25 %
Lymphs Abs: 2.2 10*3/uL (ref 0.7–4.0)
MCH: 29.1 pg (ref 26.0–34.0)
MCHC: 32.7 g/dL (ref 30.0–36.0)
MCV: 89 fL (ref 80.0–100.0)
Monocytes Absolute: 0.6 10*3/uL (ref 0.1–1.0)
Monocytes Relative: 7 %
Neutro Abs: 5.8 10*3/uL (ref 1.7–7.7)
Neutrophils Relative %: 66 %
Platelets: 327 10*3/uL (ref 150–400)
RBC: 4.47 MIL/uL (ref 4.22–5.81)
RDW: 12.6 % (ref 11.5–15.5)
WBC: 8.9 10*3/uL (ref 4.0–10.5)
nRBC: 0 % (ref 0.0–0.2)

## 2021-03-08 LAB — COMPREHENSIVE METABOLIC PANEL
ALT: 15 U/L (ref 0–44)
AST: 17 U/L (ref 15–41)
Albumin: 4.2 g/dL (ref 3.5–5.0)
Alkaline Phosphatase: 88 U/L (ref 38–126)
Anion gap: 10 (ref 5–15)
BUN: 22 mg/dL — ABNORMAL HIGH (ref 6–20)
CO2: 26 mmol/L (ref 22–32)
Calcium: 9.8 mg/dL (ref 8.9–10.3)
Chloride: 97 mmol/L — ABNORMAL LOW (ref 98–111)
Creatinine, Ser: 0.87 mg/dL (ref 0.61–1.24)
GFR, Estimated: >60 mL/min (ref >60)
Glucose, Bld: 116 mg/dL — ABNORMAL HIGH (ref 70–99)
Potassium: 4.4 mmol/L (ref 3.5–5.1)
Sodium: 133 mmol/L — ABNORMAL LOW (ref 135–145)
Total Bilirubin: 0.5 mg/dL (ref 0.3–1.2)
Total Protein: 8 g/dL (ref 6.5–8.1)

## 2021-03-08 MED ORDER — SODIUM CHLORIDE 0.9 % IV SOLN
Freq: Once | INTRAVENOUS | Status: AC
  Filled 2021-03-08: qty 250

## 2021-03-08 MED ORDER — SODIUM CHLORIDE 0.9 % IV SOLN
200.0000 mg | Freq: Once | INTRAVENOUS | Status: AC
  Administered 2021-03-08: 200 mg via INTRAVENOUS
  Filled 2021-03-08: qty 8

## 2021-03-08 NOTE — Progress Notes (Signed)
Hematology/Oncology follow up note [facility] Telephone:(336) [phone] Fax:(336) [phone]   Patient Care Team: Danae Orleans, MD as PCP - General (Internal Medicine) [name], [name], MD as PCP - Family Medicine (Family Medicine) [name], MD as Consulting Physician (General Surgery) [name], [name], MD as Attending Physician (Family Medicine) [name], [name], MD as Consulting Physician (Dermatology)  REFERRING PROVIDER: Danae Orleans, MD  CHIEF COMPLAINTS/REASON FOR VISIT:  Follow-up of kidney cancer  HISTORY OF PRESENTING ILLNESS:   Matthew Woodard is a  55 y.o.  male with PMH listed below was seen in consultation at the request of  Danae Orleans, MD  for evaluation of kidney cancer Patient's cancer history dated back to February 2020 when he developed gross hematuria with small clots and right-sided flank pain.  Patient had a CT urogram done which showed right 4 cm central enhancing renal mass with invasion into the collecting system.  X-ray of chest was performed to see complete staging which showed no concerning findings. 06/28/2018 patient underwent a cystoscopy, bladder biopsy and a right retrograde pyelogram with intraoperative interpretation, right diagnostic ureteroscopy, right renal pelvis biopsy, right ureteral stent placement.  biopsy showed atypical cell clusters, with extensive crush artifact, nondiagnostic.   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  Surgical margins negative for tumor.pT3a,Nx Patient has been on surveillance after surgery. 10/28/2018 CT abdomen pelvis with contrast showed minimal fluid and or postoperative changes within the right renal fossa.  No evidence of metastatic disease or recurrence.  Patient has left kidney lesion previously  characterized as nonenhancing cyst by multi phasic contrast-enhanced CT. Patient reports an episode of gross hematuria in early December 2020, patient is due for 54-month surveillance CT scan. 04/30/2019 CT abdomen pelvis with contrast showed high attenuation mass at the right uretero vesicle junction, with extension into the bladder.  Bilateral pulmonary nodules, most indicated for metastatic disease.   Patient underwent cystoscopy and transurethral resection of irregular bladder tumor 3 cm.  Mass appeared to emanate from the right ureteral orifice.  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 Patient was referred to me for further work-up and management.  #Staging chest CT with contrast showed numerous bilateral pulmonary nodules.  Compatible with metastatic disease. Bone scan showed no osseous metastatic disease.   #History of tobacco abuse, former smoker.  Quit in 2015.  He denies any hemoptysis, chest pain, shortness of breath today. Patient reports that hematuria has completely resolved.  Denies any pain today. He is accompanied by his wife.  #History of cutaneous psoriasis, mostly on his right hand, currently on weekly methotrexate 12.5 mg with good symptom control. #NGS: [facility] PD L1 TPS 1%  # patient was referred to ENT for evaluation of headache and sinus pressure.  He had a ENT evaluation and feels that patient does not have sinusitis.  [name] recommended MRI brain to rule out PRES syndrome.  Axitinib has been held MRI brain was done and was negative.   #09/2019 off axitinib and Keytruda since the beginning of May due to transaminitis. Patient was seen and evaluated by gastroenterology [name]. Work-up was negative. Ultrasound liver vascular Doppler is negative. Recommend observation. #11/24/2019, resumed on Keytruda every 3 weeks #01/05/2020, axitinib was resumed 3 mg #01/20/2020, axitinib was discontinued due to recurrence of  transaminitis  Beryle Flock was temporarily held. #  03/16/2020, resumed on Keytruda every 3 weeks. 09/29/2020 CT scan showed no evidence of local recurrence or new/progressive disease in the chest abdomen or pelvis.  Tiny nonspecific lung nodules are unchanged.  No new lung nodules. May 2022, left knee replacement 01/14/2021 CT chest abdomen pelvis showed stable exam, no new or progressive metastatic disease within chest abdomen pelvis. Stable lung nodules. Aortic atherosclerosis.  Sept 2022 right knee replacement  INTERVAL HISTORY Matthew Woodard is a 55 y.o. male who has above history reviewed by me today presents for follow up visit for management of metastatic RCC. During the interval patient underwent right knee replacement. He has post-operation pain.  Heart rate is high today in the clinic due to pain.  Patient denies any shortness of breath, palpitation, nausea vomiting diarrhea, chest pain.  Minimal swelling of right knee,s/p knee replacement.  Improved.   Review of Systems  Constitutional:  Negative for appetite change, chills, fatigue, fever and unexpected weight change.  HENT:   Negative for hearing loss and voice change.   Eyes:  Negative for eye problems and icterus.  Respiratory:  Negative for chest tightness, cough and shortness of breath.   Cardiovascular:  Negative for chest pain and leg swelling.  Gastrointestinal:  Negative for abdominal distention, abdominal pain, blood in stool and nausea.  Endocrine: Negative for hot flashes.  Genitourinary:  Negative for difficulty urinating, dysuria and frequency.   Musculoskeletal:  Positive for arthralgias.       Right knee replacement  Skin:  Negative for itching and rash.  Neurological:  Negative for extremity weakness, headaches, light-headedness and numbness.  Hematological:  Negative for adenopathy. Does not bruise/bleed easily.  Psychiatric/Behavioral:  Negative for confusion.    MEDICAL HISTORY:  Past Medical History:   Diagnosis Date   Arthritis    hands, left knee   Cancer ([place])    Diabetes mellitus without complication ([place])    Eczema 01/08/2017   GERD (gastroesophageal reflux disease)    History of kidney cancer    Hyperlipidemia LDL goal <100 11/02/2014   Hypertension    Kidney stone    YEAR H/O HEMATURIA   Psoriasis    Wears dentures    full upper, partial lower.  Has, does not wear    SURGICAL HISTORY: Past Surgical History:  Procedure Laterality Date   COLONOSCOPY WITH PROPOFOL N/A 02/23/2017   Procedure: COLONOSCOPY WITH PROPOFOL;  Surgeon: [name], MD;  Location: [facility];  Service: Endoscopy;  Laterality: N/A;  Diabetic - oral meds   CYST EXCISION  Aug. 2016   on back   CYSTOSCOPY W/ RETROGRADES Right 06/28/2018   Procedure: CYSTOSCOPY WITH RETROGRADE PYELOGRAM;  Surgeon: Billey Co, MD;  Location: [facility];  Service: Urology;  Laterality: Right;   CYSTOSCOPY W/ URETERAL STENT REMOVAL Right 07/22/2018   Procedure: CYSTOSCOPY WITH STENT REMOVAL;  Surgeon: Billey Co, MD;  Location: [facility];  Service: Urology;  Laterality: Right;   CYSTOSCOPY WITH BIOPSY Right 06/28/2018   Procedure: CYSTOSCOPY WITH RENAL BIOPSY;  Surgeon: Billey Co, MD;  Location: [facility];  Service: Urology;  Laterality: Right;   CYSTOSCOPY WITH URETEROSCOPY AND STENT PLACEMENT Right 06/28/2018   Procedure: CYSTOSCOPY WITH URETEROSCOPY AND STENT PLACEMENT;  Surgeon: Billey Co, MD;  Location: [facility];  Service: Urology;  Laterality: Right;   FULGURATION OF BLADDER TUMOR N/A 06/28/2018   Procedure: CYSTOSCOPY BLADDER BIOPSY, FULGERATION OF BLADDER;  Surgeon: Billey Co, MD;  Location: [facility];  Service: Urology;  Laterality: N/A;   KNEE SURGERY Left    LAPAROSCOPIC NEPHRECTOMY, HAND ASSISTED Right 07/22/2018   Procedure: HAND ASSISTED LAPAROSCOPIC NEPHRECTOMY;  Surgeon: Billey Co, MD;  Location: [facility];  Service: Urology;  Laterality: Right;   NASAL SINUS SURGERY   03/15   POLYPECTOMY N/A 02/23/2017   Procedure: POLYPECTOMY;  Surgeon: [name], MD;  Location: [place];  Service: Endoscopy;  Laterality: N/A;   tooth abstraction     TRANSURETHRAL RESECTION OF BLADDER TUMOR N/A 05/12/2019   Procedure: TRANSURETHRAL RESECTION OF BLADDER TUMOR (TURBT);  Surgeon: Billey Co, MD;  Location: [facility];  Service: Urology;  Laterality: N/A;   VARICOCELE EXCISION     XI ROBOTIC ASSISTED VENTRAL HERNIA N/A 11/18/2020   Procedure: XI ROBOTIC ASSISTED VENTRAL HERNIA, incisional;  Surgeon: Jules Husbands, MD;  Location: [facility];  Service: General;  Laterality: N/A;    SOCIAL HISTORY: Social History   Socioeconomic History   Marital status: Married    Spouse name: Alma Friendly   Number of children: Not on file   Years of education: Not on file   Highest education level: Not on file  Occupational History   Not on file  Tobacco Use   Smoking status: Former    Types: Cigarettes    Quit date: 06/15/2013    Years since quitting: 7.7   Smokeless tobacco: Never  Vaping Use   Vaping Use: Never used  Substance and Sexual Activity   Alcohol use: No    Alcohol/week: 0.0 standard drinks   Drug use: No   Sexual activity: Yes  Other Topics Concern   Not on file  Social History Narrative   Lives with wife, sister in law   Social Determinants of Health   Financial Resource Strain: Not on file  Food Insecurity: Not on file  Transportation Needs: Not on file  Physical Activity: Not on file  Stress: Not on file  Social Connections: Not on file  Intimate Partner Violence: Not on file    FAMILY HISTORY: Family History  Problem Relation Age of Onset   Heart disease Father    Hypertension Father    COPD Father    Colon cancer Maternal Grandmother    Heart attack Paternal Grandfather     ALLERGIES:  is allergic to glucotrol [glipizide].  MEDICATIONS:  Current Outpatient Medications  Medication Sig Dispense Refill   aspirin 81 MG EC tablet  Take 81 mg by mouth daily with supper.     clobetasol ointment (TEMOVATE) 5.46 % Apply 1 application topically 2 (two) times daily as needed for rash.     cyclobenzaprine (FLEXERIL) 10 MG tablet Take 10 mg by mouth 3 (three) times daily as needed for muscle spasms.     Glucose Blood (ACCU-CHEK AVIVA PLUS VI) by In Vitro route.     HYDROcodone-acetaminophen (NORCO/VICODIN) 5-325 MG tablet Take 1-2 tablets by mouth every 4 (four) hours as needed for moderate pain. 20 tablet 0   JARDIANCE 10 MG TABS tablet TAKE 1 TABLET BY MOUTH DAILY 30 tablet 2   lisinopril (PRINIVIL,ZESTRIL) 10 MG tablet TAKE 1 TABLET(10 MG) BY MOUTH DAILY (Patient taking differently: Take 10 mg by mouth daily with supper.) 90 tablet 1   metFORMIN (GLUCOPHAGE-XR) 500 MG 24 hr tablet Take 3 pills by mouth daily for 4-5 days, then increase to 4 pills daily (if tolerated) (Patient taking differently: Take 1,000 mg by mouth daily with supper.) 360 tablet 0   Multiple Vitamin (MULTIVITAMIN) tablet Take 1  tablet by mouth daily with supper.     omeprazole (PRILOSEC) 20 MG capsule Take 20 mg by mouth daily as needed (he[place]).     Oxycodone HCl 10 MG TABS Take 10 mg by mouth every 4 (four) hours.     OZEMPIC, 0.25 OR 0.5 MG/DOSE, 2 MG/1.5ML SOPN Inject 1 mg into the skin every Sunday.     rosuvastatin (CRESTOR) 20 MG tablet Take 20 mg by mouth daily with supper.     traZODone (DESYREL) 50 MG tablet TAKE 1 TABLET(50 MG) BY MOUTH AT BEDTIME 30 tablet 3   prochlorperazine (COMPAZINE) 10 MG tablet Take 1 tablet (10 mg total) by mouth every 6 (six) hours as needed for nausea or vomiting. (Patient not taking: No sig reported) 15 tablet 0   No current facility-administered medications for this visit.     PHYSICAL EXAMINATION: ECOG PERFORMANCE STATUS: 1 - Symptomatic but completely ambulatory Vitals:   03/08/21 0918 03/08/21 1004  BP: (!) 149/86   Pulse: (!) 119 (!) 114  Resp: 18   Temp: (!) 97.3 F (36.3 C)   SpO2: 100%    Filed  Weights   03/08/21 0918  Weight: 199 lb 3.2 oz (90.4 kg)    Physical Exam Constitutional:      General: He is not in acute distress.    Comments: Patient walks with a walker  HENT:     Head: Normocephalic and atraumatic.  Eyes:     General: No scleral icterus.    Pupils: Pupils are equal, round, and reactive to light.  Cardiovascular:     Rate and Rhythm: Regular rhythm. Tachycardia present.     Heart sounds: Normal heart sounds.  Pulmonary:     Effort: Pulmonary effort is normal. No respiratory distress.     Breath sounds: No wheezing.  Abdominal:     General: Bowel sounds are normal. There is no distension.     Palpations: Abdomen is soft. There is no mass.     Tenderness: There is no abdominal tenderness.  Musculoskeletal:        General: No deformity. Normal range of motion.     Cervical back: Normal range of motion and neck supple.     Comments: Right knee status post knee replacement.   Skin:    General: Skin is warm and dry.     Findings: No erythema or rash.  Neurological:     Mental Status: He is alert and oriented to person, place, and time. Mental status is at baseline.     Cranial Nerves: No cranial nerve deficit.     Coordination: Coordination normal.  Psychiatric:        Mood and Affect: Mood normal.    LABORATORY DATA:  I have reviewed the data as listed Lab Results  Component Value Date   WBC 8.9 03/08/2021   HGB 13.0 03/08/2021   HCT 39.8 03/08/2021   MCV 89.0 03/08/2021   PLT 327 03/08/2021   Recent Labs    01/18/21 0824 02/15/21 0841 03/08/21 0904  NA 135 131* 133*  K 4.3 4.3 4.4  CL 102 97* 97*  CO2 25 27 26   GLUCOSE 148* 221* 116*  BUN 25* 21* 22*  CREATININE 1.06 1.04 0.87  CALCIUM 9.2 9.6 9.8  GFRNONAA >60 >60 >60  PROT 7.5 8.1 8.0  ALBUMIN 4.3 4.0 4.2  AST 20 21 17   ALT 18 15 15   ALKPHOS 67 108 88  BILITOT 0.4 0.5 0.5    Iron/TIBC/Ferritin/ %Sat  Component Value Date/Time   IRON 118 09/25/2019 1115   TIBC 454 (H)  09/25/2019 1115   FERRITIN 236 09/25/2019 1115   IRONPCTSAT 26 09/25/2019 1115      RADIOGRAPHIC STUDIES: I have personally reviewed the radiological images as listed and agreed with the findings in the report.  CT CHEST ABDOMEN PELVIS WO CONTRAST  Result Date: 01/15/2021 CLINICAL DATA:  History of metastatic renal cell carcinoma, total nephrectomy 08/02/2018 and Keytruda immunotherapy infusions, restaging/follow-up. EXAM: CT CHEST, ABDOMEN AND PELVIS WITHOUT CONTRAST TECHNIQUE: Multidetector CT imaging of the chest, abdomen and pelvis was performed following the standard protocol without IV contrast. COMPARISON:  Multiple priors including most recent CT Sep 29, 2020 FINDINGS: CT CHEST FINDINGS Cardiovascular: Aortic atherosclerosis without aneurysmal dilation. Normal caliber central pulmonary arteries. Normal size heart. No significant pericardial effusion/thickening. Coronary artery calcifications. Mediastinum/Nodes: No discrete thyroid nodule. No pathologically enlarged mediastinal, hilar or axillary lymph nodes, noting limited sensitivity for the detection of hilar adenopathy on this noncontrast study. Lungs/Pleura: Stable indexed 4 mm left upper lobe pulmonary nodule on image 49/3. The additional milli metric scattered pulmonary nodules appears similar prior. No new suspicious pulmonary nodules or masses. No focal airspace consolidation. No pleural effusion. Pneumothorax. Musculoskeletal: No aggressive lytic or blastic of bone. No suspicious chest wall mass. CT ABDOMEN PELVIS FINDINGS Hepatobiliary: Unremarkable noncontrast appearance of the hepatic parenchyma. Gallbladder is unremarkable. No biliary ductal dilation. Pancreas: Within normal limits. Spleen: Within normal limits. Adrenals/Urinary Tract: Bilateral adrenal glands are unremarkable. Prior right nephrectomy without new suspicious soft tissue nodularity in the nephrectomy bed. No left hydronephrosis or nephrolithiasis. Stable hypodense 1.2 cm  cortical lesion in the interpolar region left kidney on image 73/2, consistent with a cyst. Urinary bladder is unremarkable for degree of distension. Stomach/Bowel: Renal pain enteric contrast traverses distal small bowel. Stomach is unremarkable for degree distension. No pathologic dilation of small large bowel. The appendix and terminal ileum appear unremarkable. No evidence of acute bowel inflammation. Vascular/Lymphatic: Aortic and branch vessel atherosclerosis without abdominal aortic aneurysm. No pathologically enlarged abdominopelvic lymph nodes. Reproductive: Prostate unremarkable. Other: No abdominopelvic ascites. Musculoskeletal: No aggressive lytic or blastic lesion of bone. No acute osseous abnormality. IMPRESSION: 1. Stable exam. No evidence of recurrent or new/progressive metastatic disease within the chest, abdomen, or pelvis. 2. Stable tiny bilateral pulmonary nodules measuring up to 4 mm. 3.  Aortic Atherosclerosis (ICD10-I70.0). Electronically Signed   By: Dahlia Bailiff M.D.   On: 01/15/2021 21:38       ASSESSMENT & PLAN:  1. Clear cell carcinoma of right kidney ([place])   2. Encounter for antineoplastic immunotherapy   3. Tachycardia   4. 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 Labs reviewed and discussed with patient.  Proceed with Keytruda today.   Recent history of knee replacement.  Follow-up with orthopedic surgeon.  He is on aspirin 81 mg currently for DVT prophylaxis.  Tachycardia is likely due to pain.  Recommend patient to increase oral hydration.  Notify me if persistently tachycardia, and or developing new symptoms including shortness of breath/chest pain  Hypertension, patient is on lisinopril.  Norvasc was held postop.  Recommend patient to restart.  Follow-up in 3 weeks All questions were answered. The patient knows to call the clinic with any problems questions or concerns.  Earlie Server, MD,  PhD 03/08/2021

## 2021-03-08 NOTE — Progress Notes (Signed)
Pulse Rate: 114. MD, Dr. Tasia Catchings, aware. Per MD order: proceed with scheduled Keytruda treatment today.

## 2021-03-08 NOTE — Patient Instructions (Signed)
[facility] [place] [facility]   Discharge Instructions: Thank you for choosing [facility] to provide your oncology and hematology care.  If you have a lab appointment with the Cancer Center, please go directly to the Cancer Center and check in at the registration area.  We strive to give you quality time with your provider. You may need to reschedule your appointment if you arrive late (15 or more minutes).  Arriving late affects you and other patients whose appointments are after yours.  Also, if you miss three or more appointments without notifying the office, you may be dismissed from the clinic at the provider's discretion.      For prescription refill requests, have your pharmacy contact our office and allow 72 hours for refills to be completed.    Today you received the following chemotherapy and/or immunotherapy agents: Keytruda.      To help prevent nausea and vomiting after your treatment, we encourage you to take your nausea medication as directed.  BELOW ARE SYMPTOMS THAT SHOULD BE REPORTED IMMEDIATELY: *FEVER GREATER THAN 100.4 F (38 C) OR HIGHER *CHILLS OR SWEATING *NAUSEA AND VOMITING THAT IS NOT CONTROLLED WITH YOUR NAUSEA MEDICATION *UNUSUAL SHORTNESS OF BREATH *UNUSUAL BRUISING OR BLEEDING *URINARY PROBLEMS (pain or burning when urinating, or frequent urination) *BOWEL PROBLEMS (unusual diarrhea, constipation, pain near the anus) TENDERNESS IN MOUTH AND THROAT WITH OR WITHOUT PRESENCE OF ULCERS (sore throat, sores in mouth, or a toothache) UNUSUAL RASH, SWELLING OR PAIN  UNUSUAL VAGINAL DISCHARGE OR ITCHING   Items with * indicate a potential emergency and should be followed up as soon as possible or go to the Emergency Department if any problems should occur.  Please show the CHEMOTHERAPY ALERT CARD or IMMUNOTHERAPY ALERT CARD at check-in to the Emergency Department and triage nurse.  Should you have questions after your visit or need  to cancel or reschedule your appointment, please contact [facility] [place] [facility]  [phone] and follow the prompts.  Office hours are 8:00 a.m. to 4:30 p.m. Monday - Friday. Please note that voicemails left after 4:00 p.m. may not be returned until the following business day.  We are closed weekends and major holidays. You have access to a nurse at all times for urgent questions. Please call the main number to the clinic [phone] and follow the prompts.  For any non-urgent questions, you may also contact your provider using MyChart. We now offer e-Visits for anyone 18 and older to request care online for non-urgent symptoms. For details visit [email or web address].   Also download the MyChart app! Go to the app store, search "MyChart", open the app, select [facility], and log in with your MyChart username and password.  Due to Covid, a mask is required upon entering the hospital/clinic. If you do not have a mask, one will be given to you upon arrival. For doctor visits, patients may have 1 support person aged 18 or older with them. For treatment visits, patients cannot have anyone with them due to current Covid guidelines and our immunocompromised population.  

## 2021-03-08 NOTE — Progress Notes (Signed)
Patient here for follow up. No new concerns voiced.  °

## 2021-03-29 ENCOUNTER — Inpatient Hospital Stay: Payer: BC Managed Care – PPO

## 2021-03-29 ENCOUNTER — Encounter: Payer: Self-pay | Admitting: Oncology

## 2021-03-29 ENCOUNTER — Inpatient Hospital Stay (HOSPITAL_BASED_OUTPATIENT_CLINIC_OR_DEPARTMENT_OTHER): Payer: BC Managed Care – PPO | Admitting: Oncology

## 2021-03-29 ENCOUNTER — Other Ambulatory Visit: Payer: Self-pay

## 2021-03-29 ENCOUNTER — Inpatient Hospital Stay: Payer: BC Managed Care – PPO | Attending: Oncology

## 2021-03-29 VITALS — BP 154/90 | HR 106 | Temp 97.8°F | Resp 18 | Wt 204.0 lb

## 2021-03-29 DIAGNOSIS — R000 Tachycardia, unspecified: Secondary | ICD-10-CM | POA: Diagnosis not present

## 2021-03-29 DIAGNOSIS — Z7984 Long term (current) use of oral hypoglycemic drugs: Secondary | ICD-10-CM | POA: Diagnosis not present

## 2021-03-29 DIAGNOSIS — E785 Hyperlipidemia, unspecified: Secondary | ICD-10-CM | POA: Diagnosis not present

## 2021-03-29 DIAGNOSIS — I10 Essential (primary) hypertension: Secondary | ICD-10-CM | POA: Insufficient documentation

## 2021-03-29 DIAGNOSIS — K219 Gastro-esophageal reflux disease without esophagitis: Secondary | ICD-10-CM | POA: Insufficient documentation

## 2021-03-29 DIAGNOSIS — Z79899 Other long term (current) drug therapy: Secondary | ICD-10-CM | POA: Diagnosis not present

## 2021-03-29 DIAGNOSIS — C641 Malignant neoplasm of right kidney, except renal pelvis: Secondary | ICD-10-CM

## 2021-03-29 DIAGNOSIS — E119 Type 2 diabetes mellitus without complications: Secondary | ICD-10-CM | POA: Insufficient documentation

## 2021-03-29 DIAGNOSIS — L409 Psoriasis, unspecified: Secondary | ICD-10-CM | POA: Diagnosis not present

## 2021-03-29 DIAGNOSIS — C7800 Secondary malignant neoplasm of unspecified lung: Secondary | ICD-10-CM | POA: Diagnosis not present

## 2021-03-29 DIAGNOSIS — Z5112 Encounter for antineoplastic immunotherapy: Secondary | ICD-10-CM

## 2021-03-29 DIAGNOSIS — C7911 Secondary malignant neoplasm of bladder: Secondary | ICD-10-CM | POA: Insufficient documentation

## 2021-03-29 DIAGNOSIS — Z7982 Long term (current) use of aspirin: Secondary | ICD-10-CM | POA: Insufficient documentation

## 2021-03-29 DIAGNOSIS — R21 Rash and other nonspecific skin eruption: Secondary | ICD-10-CM

## 2021-03-29 DIAGNOSIS — Z96659 Presence of unspecified artificial knee joint: Secondary | ICD-10-CM | POA: Diagnosis not present

## 2021-03-29 LAB — COMPREHENSIVE METABOLIC PANEL
ALT: 18 U/L (ref 0–44)
AST: 18 U/L (ref 15–41)
Albumin: 4.2 g/dL (ref 3.5–5.0)
Alkaline Phosphatase: 87 U/L (ref 38–126)
Anion gap: 10 (ref 5–15)
BUN: 17 mg/dL (ref 6–20)
CO2: 28 mmol/L (ref 22–32)
Calcium: 9.6 mg/dL (ref 8.9–10.3)
Chloride: 96 mmol/L — ABNORMAL LOW (ref 98–111)
Creatinine, Ser: 0.99 mg/dL (ref 0.61–1.24)
GFR, Estimated: >60 mL/min (ref >60)
Glucose, Bld: 185 mg/dL — ABNORMAL HIGH (ref 70–99)
Potassium: 4.3 mmol/L (ref 3.5–5.1)
Sodium: 134 mmol/L — ABNORMAL LOW (ref 135–145)
Total Bilirubin: 0.2 mg/dL — ABNORMAL LOW (ref 0.3–1.2)
Total Protein: 7.7 g/dL (ref 6.5–8.1)

## 2021-03-29 LAB — CBC WITH DIFFERENTIAL/PLATELET
Abs Immature Granulocytes: 0.05 10*3/uL (ref 0.00–0.07)
Basophils Absolute: 0.1 10*3/uL (ref 0.0–0.1)
Basophils Relative: 1 %
Eosinophils Absolute: 0.2 10*3/uL (ref 0.0–0.5)
Eosinophils Relative: 3 %
HCT: 41.7 % (ref 39.0–52.0)
Hemoglobin: 13.5 g/dL (ref 13.0–17.0)
Immature Granulocytes: 1 %
Lymphocytes Relative: 26 %
Lymphs Abs: 1.9 10*3/uL (ref 0.7–4.0)
MCH: 28.5 pg (ref 26.0–34.0)
MCHC: 32.4 g/dL (ref 30.0–36.0)
MCV: 88.2 fL (ref 80.0–100.0)
Monocytes Absolute: 0.5 10*3/uL (ref 0.1–1.0)
Monocytes Relative: 7 %
Neutro Abs: 4.6 10*3/uL (ref 1.7–7.7)
Neutrophils Relative %: 62 %
Platelets: 289 10*3/uL (ref 150–400)
RBC: 4.73 MIL/uL (ref 4.22–5.81)
RDW: 12.8 % (ref 11.5–15.5)
WBC: 7.3 10*3/uL (ref 4.0–10.5)
nRBC: 0 % (ref 0.0–0.2)

## 2021-03-29 MED ORDER — SODIUM CHLORIDE 0.9 % IV SOLN
Freq: Once | INTRAVENOUS | Status: AC
  Filled 2021-03-29: qty 250

## 2021-03-29 MED ORDER — SODIUM CHLORIDE 0.9 % IV SOLN
200.0000 mg | Freq: Once | INTRAVENOUS | Status: AC
  Administered 2021-03-29: 200 mg via INTRAVENOUS
  Filled 2021-03-29: qty 8

## 2021-03-29 NOTE — Progress Notes (Signed)
Pt here for follow up. No new concerns voiced.   

## 2021-03-29 NOTE — Patient Instructions (Signed)
[facility] [place] [facility]   Discharge Instructions: Thank you for choosing [facility] to provide your oncology and hematology care.  If you have a lab appointment with the Cancer Center, please go directly to the Cancer Center and check in at the registration area.  We strive to give you quality time with your provider. You may need to reschedule your appointment if you arrive late (15 or more minutes).  Arriving late affects you and other patients whose appointments are after yours.  Also, if you miss three or more appointments without notifying the office, you may be dismissed from the clinic at the provider's discretion.      For prescription refill requests, have your pharmacy contact our office and allow 72 hours for refills to be completed.    Today you received the following chemotherapy and/or immunotherapy agents: Keytruda.      To help prevent nausea and vomiting after your treatment, we encourage you to take your nausea medication as directed.  BELOW ARE SYMPTOMS THAT SHOULD BE REPORTED IMMEDIATELY: *FEVER GREATER THAN 100.4 F (38 C) OR HIGHER *CHILLS OR SWEATING *NAUSEA AND VOMITING THAT IS NOT CONTROLLED WITH YOUR NAUSEA MEDICATION *UNUSUAL SHORTNESS OF BREATH *UNUSUAL BRUISING OR BLEEDING *URINARY PROBLEMS (pain or burning when urinating, or frequent urination) *BOWEL PROBLEMS (unusual diarrhea, constipation, pain near the anus) TENDERNESS IN MOUTH AND THROAT WITH OR WITHOUT PRESENCE OF ULCERS (sore throat, sores in mouth, or a toothache) UNUSUAL RASH, SWELLING OR PAIN  UNUSUAL VAGINAL DISCHARGE OR ITCHING   Items with * indicate a potential emergency and should be followed up as soon as possible or go to the Emergency Department if any problems should occur.  Please show the CHEMOTHERAPY ALERT CARD or IMMUNOTHERAPY ALERT CARD at check-in to the Emergency Department and triage nurse.  Should you have questions after your visit or need  to cancel or reschedule your appointment, please contact [facility] [place] [facility]  [phone] and follow the prompts.  Office hours are 8:00 a.m. to 4:30 p.m. Monday - Friday. Please note that voicemails left after 4:00 p.m. may not be returned until the following business day.  We are closed weekends and major holidays. You have access to a nurse at all times for urgent questions. Please call the main number to the clinic [phone] and follow the prompts.  For any non-urgent questions, you may also contact your provider using MyChart. We now offer e-Visits for anyone 18 and older to request care online for non-urgent symptoms. For details visit [email or web address].   Also download the MyChart app! Go to the app store, search "MyChart", open the app, select [facility], and log in with your MyChart username and password.  Due to Covid, a mask is required upon entering the hospital/clinic. If you do not have a mask, one will be given to you upon arrival. For doctor visits, patients may have 1 support person aged 18 or older with them. For treatment visits, patients cannot have anyone with them due to current Covid guidelines and our immunocompromised population.  

## 2021-03-29 NOTE — Progress Notes (Signed)
Hematology/Oncology follow up note [phone] [phone]   Patient Care Team: Danae Orleans, MD as PCP - General (Internal Medicine) [name], [name], MD as PCP - Family Medicine (Family Medicine) [name], MD as Consulting Physician (General Surgery) [name], [name], MD as Attending Physician (Family Medicine) [name], [name], MD as Consulting Physician (Dermatology)  REFERRING PROVIDER: Danae Orleans, MD  CHIEF COMPLAINTS/REASON FOR VISIT:  Follow-up of kidney cancer  HISTORY OF PRESENTING ILLNESS:   Matthew Woodard is a  55 y.o.  male with PMH listed below was seen in consultation at the request of  Danae Orleans, MD  for evaluation of kidney cancer Patient's cancer history dated back to February 2020 when he developed gross hematuria with small clots and right-sided flank pain.  Patient had a CT urogram done which showed right 4 cm central enhancing renal mass with invasion into the collecting system.  X-ray of chest was performed to see complete staging which showed no concerning findings. 06/28/2018 patient underwent a cystoscopy, bladder biopsy and a right retrograde pyelogram with intraoperative interpretation, right diagnostic ureteroscopy, right renal pelvis biopsy, right ureteral stent placement.  biopsy showed atypical cell clusters, with extensive crush artifact, nondiagnostic.   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  Surgical margins negative for tumor.pT3a,Nx Patient has been on surveillance after surgery. 10/28/2018 CT abdomen pelvis with contrast showed minimal fluid and or postoperative changes within the right renal fossa.  No evidence of metastatic disease or recurrence.  Patient has left kidney lesion previously characterized as nonenhancing cyst by  multi phasic contrast-enhanced CT. Patient reports an episode of gross hematuria in early December 2020, patient is due for 53-month surveillance CT scan. 04/30/2019 CT abdomen pelvis with contrast showed high attenuation mass at the right uretero vesicle junction, with extension into the bladder.  Bilateral pulmonary nodules, most indicated for metastatic disease.   Patient underwent cystoscopy and transurethral resection of irregular bladder tumor 3 cm.  Mass appeared to emanate from the right ureteral orifice.  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 Patient was referred to me for further work-up and management.  #Staging chest CT with contrast showed numerous bilateral pulmonary nodules.  Compatible with metastatic disease. Bone scan showed no osseous metastatic disease.   #History of tobacco abuse, former smoker.  Quit in 2015.  He denies any hemoptysis, chest pain, shortness of breath today. Patient reports that hematuria has completely resolved.  Denies any pain today. He is accompanied by his wife.  #History of cutaneous psoriasis, mostly on his right hand, currently on weekly methotrexate 12.5 mg with good symptom control. #NGS: [facility] PD L1 TPS 1%  # patient was referred to ENT for evaluation of headache and sinus pressure.  He had a ENT evaluation and feels that patient does not have sinusitis.  [name] recommended MRI brain to rule out PRES syndrome.  Axitinib has been held MRI brain was done and was negative.   #09/2019 off axitinib and Keytruda since the beginning of May due to transaminitis. Patient was seen and evaluated by gastroenterology [name]. Work-up was negative. Ultrasound liver vascular Doppler is negative. Recommend observation. #11/24/2019, resumed on Keytruda every 3 weeks #01/05/2020, axitinib was resumed 3 mg #01/20/2020, axitinib was discontinued due to recurrence of transaminitis  Beryle Flock was temporarily  held. #03/16/2020, resumed on Keytruda  every 3 weeks. 09/29/2020 CT scan showed no evidence of local recurrence or new/progressive disease in the chest abdomen or pelvis.  Tiny nonspecific lung nodules are unchanged.  No new lung nodules. May 2022, left knee replacement 01/14/2021 CT chest abdomen pelvis showed stable exam, no new or progressive metastatic disease within chest abdomen pelvis. Stable lung nodules. Aortic atherosclerosis.  Sept 2022 right knee replacement  INTERVAL HISTORY Matthew Woodard is a 54 y.o. male who has above history reviewed by me today presents for follow up visit for management of metastatic RCC. Patient has no new complaints. Right knee replacement, pain is improving.  Patient denies any shortness of breath, chest pain or leg swelling.   Review of Systems  Constitutional:  Negative for appetite change, chills, fatigue, fever and unexpected weight change.  HENT:   Negative for hearing loss and voice change.   Eyes:  Negative for eye problems and icterus.  Respiratory:  Negative for chest tightness, cough and shortness of breath.   Cardiovascular:  Negative for chest pain and leg swelling.  Gastrointestinal:  Negative for abdominal distention, abdominal pain, blood in stool and nausea.  Endocrine: Negative for hot flashes.  Genitourinary:  Negative for difficulty urinating, dysuria and frequency.   Musculoskeletal:  Positive for arthralgias.       Right knee replacement  Skin:  Negative for itching and rash.  Neurological:  Negative for extremity weakness, headaches, light-headedness and numbness.  Hematological:  Negative for adenopathy. Does not bruise/bleed easily.  Psychiatric/Behavioral:  Negative for confusion.    MEDICAL HISTORY:  Past Medical History:  Diagnosis Date   Arthritis    hands, left knee   Cancer ([place])    Diabetes mellitus without complication ([place])    Eczema 01/08/2017   GERD (gastroesophageal reflux disease)    History of kidney  cancer    Hyperlipidemia LDL goal <100 11/02/2014   Hypertension    Kidney stone    YEAR H/O HEMATURIA   Psoriasis    Wears dentures    full upper, partial lower.  Has, does not wear    SURGICAL HISTORY: Past Surgical History:  Procedure Laterality Date   COLONOSCOPY WITH PROPOFOL N/A 02/23/2017   Procedure: COLONOSCOPY WITH PROPOFOL;  Surgeon: [name], MD;  Location: [place];  Service: Endoscopy;  Laterality: N/A;  Diabetic - oral meds   CYST EXCISION  Aug. 2016   on back   CYSTOSCOPY W/ RETROGRADES Right 06/28/2018   Procedure: CYSTOSCOPY WITH RETROGRADE PYELOGRAM;  Surgeon: Billey Co, MD;  Location: [facility];  Service: Urology;  Laterality: Right;   CYSTOSCOPY W/ URETERAL STENT REMOVAL Right 07/22/2018   Procedure: CYSTOSCOPY WITH STENT REMOVAL;  Surgeon: Billey Co, MD;  Location: [facility];  Service: Urology;  Laterality: Right;   CYSTOSCOPY WITH BIOPSY Right 06/28/2018   Procedure: CYSTOSCOPY WITH RENAL BIOPSY;  Surgeon: Billey Co, MD;  Location: [facility];  Service: Urology;  Laterality: Right;   CYSTOSCOPY WITH URETEROSCOPY AND STENT PLACEMENT Right 06/28/2018   Procedure: CYSTOSCOPY WITH URETEROSCOPY AND STENT PLACEMENT;  Surgeon: Billey Co, MD;  Location: [facility];  Service: Urology;  Laterality: Right;   FULGURATION OF BLADDER TUMOR N/A 06/28/2018   Procedure: CYSTOSCOPY BLADDER BIOPSY, FULGERATION OF BLADDER;  Surgeon: Billey Co, MD;  Location: [facility];  Service: Urology;  Laterality: N/A;   KNEE SURGERY Left    LAPAROSCOPIC NEPHRECTOMY, HAND ASSISTED Right 07/22/2018   Procedure: HAND ASSISTED LAPAROSCOPIC NEPHRECTOMY;  Surgeon: Nickolas Madrid  C, MD;  Location: [facility];  Service: Urology;  Laterality: Right;   NASAL SINUS SURGERY  03/15   POLYPECTOMY N/A 02/23/2017   Procedure: POLYPECTOMY;  Surgeon: [name], MD;  Location: [facility];  Service: Endoscopy;  Laterality: N/A;   tooth abstraction     TRANSURETHRAL  RESECTION OF BLADDER TUMOR N/A 05/12/2019   Procedure: TRANSURETHRAL RESECTION OF BLADDER TUMOR (TURBT);  Surgeon: Billey Co, MD;  Location: [facility];  Service: Urology;  Laterality: N/A;   VARICOCELE EXCISION     XI ROBOTIC ASSISTED VENTRAL HERNIA N/A 11/18/2020   Procedure: XI ROBOTIC ASSISTED VENTRAL HERNIA, incisional;  Surgeon: Jules Husbands, MD;  Location: [facility];  Service: General;  Laterality: N/A;    SOCIAL HISTORY: Social History   Socioeconomic History   Marital status: Married    Spouse name: Alma Friendly   Number of children: Not on file   Years of education: Not on file   Highest education level: Not on file  Occupational History   Not on file  Tobacco Use   Smoking status: Former    Types: Cigarettes    Quit date: 06/15/2013    Years since quitting: 7.7   Smokeless tobacco: Never  Vaping Use   Vaping Use: Never used  Substance and Sexual Activity   Alcohol use: No    Alcohol/week: 0.0 standard drinks   Drug use: No   Sexual activity: Yes  Other Topics Concern   Not on file  Social History Narrative   Lives with wife, sister in law   Social Determinants of Health   Financial Resource Strain: Not on file  Food Insecurity: Not on file  Transportation Needs: Not on file  Physical Activity: Not on file  Stress: Not on file  Social Connections: Not on file  Intimate Partner Violence: Not on file    FAMILY HISTORY: Family History  Problem Relation Age of Onset   Heart disease Father    Hypertension Father    COPD Father    Colon cancer Maternal Grandmother    Heart attack Paternal Grandfather     ALLERGIES:  is allergic to glucotrol [glipizide].  MEDICATIONS:  Current Outpatient Medications  Medication Sig Dispense Refill   aspirin 81 MG EC tablet Take 81 mg by mouth daily with supper.     clobetasol ointment (TEMOVATE) 4.40 % Apply 1 application topically 2 (two) times daily as needed for rash.     cyclobenzaprine (FLEXERIL) 10 MG tablet Take 10  mg by mouth 3 (three) times daily as needed for muscle spasms.     Glucose Blood (ACCU-CHEK AVIVA PLUS VI) by In Vitro route.     HYDROcodone-acetaminophen (NORCO/VICODIN) 5-325 MG tablet Take 1-2 tablets by mouth every 4 (four) hours as needed for moderate pain. 20 tablet 0   JARDIANCE 10 MG TABS tablet TAKE 1 TABLET BY MOUTH DAILY 30 tablet 2   lisinopril (PRINIVIL,ZESTRIL) 10 MG tablet TAKE 1 TABLET(10 MG) BY MOUTH DAILY (Patient taking differently: Take 10 mg by mouth daily with supper.) 90 tablet 1   metFORMIN (GLUCOPHAGE-XR) 500 MG 24 hr tablet Take 3 pills by mouth daily for 4-5 days, then increase to 4 pills daily (if tolerated) (Patient taking differently: Take 1,000 mg by mouth daily with supper.) 360 tablet 0   Multiple Vitamin (MULTIVITAMIN) tablet Take 1 tablet by mouth daily with supper.     omeprazole (PRILOSEC) 20 MG capsule Take 20 mg by mouth daily as needed ([place]).  Oxycodone HCl 10 MG TABS Take 10 mg by mouth every 4 (four) hours.     OZEMPIC, 0.25 OR 0.5 MG/DOSE, 2 MG/1.5ML SOPN Inject 1 mg into the skin every Sunday.     prochlorperazine (COMPAZINE) 10 MG tablet Take 1 tablet (10 mg total) by mouth every 6 (six) hours as needed for nausea or vomiting. 15 tablet 0   rosuvastatin (CRESTOR) 20 MG tablet Take 20 mg by mouth daily with supper.     traZODone (DESYREL) 50 MG tablet TAKE 1 TABLET(50 MG) BY MOUTH AT BEDTIME 30 tablet 3   No current facility-administered medications for this visit.     PHYSICAL EXAMINATION: ECOG PERFORMANCE STATUS: 1 - Symptomatic but completely ambulatory Vitals:   03/29/21 1401  BP: (!) 154/90  Pulse: (!) 106  Resp: 18  Temp: 97.8 F (36.6 C)   Filed Weights   03/29/21 1401  Weight: 204 lb (92.5 kg)    Physical Exam Constitutional:      General: He is not in acute distress.    Comments: Patient walks with a walker  HENT:     Head: Normocephalic and atraumatic.  Eyes:     General: No scleral icterus.    Pupils: Pupils are  equal, round, and reactive to light.  Cardiovascular:     Rate and Rhythm: Regular rhythm. Tachycardia present.     Heart sounds: Normal heart sounds.  Pulmonary:     Effort: Pulmonary effort is normal. No respiratory distress.     Breath sounds: No wheezing.  Abdominal:     General: Bowel sounds are normal. There is no distension.     Palpations: Abdomen is soft. There is no mass.     Tenderness: There is no abdominal tenderness.  Musculoskeletal:        General: No deformity. Normal range of motion.     Cervical back: Normal range of motion and neck supple.     Comments: Right knee status post knee replacement.   Skin:    General: Skin is warm and dry.     Findings: No erythema or rash.  Neurological:     Mental Status: He is alert and oriented to person, place, and time. Mental status is at baseline.     Cranial Nerves: No cranial nerve deficit.     Coordination: Coordination normal.  Psychiatric:        Mood and Affect: Mood normal.    LABORATORY DATA:  I have reviewed the data as listed Lab Results  Component Value Date   WBC 7.3 03/29/2021   HGB 13.5 03/29/2021   HCT 41.7 03/29/2021   MCV 88.2 03/29/2021   PLT 289 03/29/2021   Recent Labs    02/15/21 0841 03/08/21 0904 03/29/21 1352  NA 131* 133* 134*  K 4.3 4.4 4.3  CL 97* 97* 96*  CO2 27 26 28   GLUCOSE 221* 116* 185*  BUN 21* 22* 17  CREATININE 1.04 0.87 0.99  CALCIUM 9.6 9.8 9.6  GFRNONAA >60 >60 >60  PROT 8.1 8.0 7.7  ALBUMIN 4.0 4.2 4.2  AST 21 17 18   ALT 15 15 18   ALKPHOS 108 88 87  BILITOT 0.5 0.5 0.2*    Iron/TIBC/Ferritin/ %Sat    Component Value Date/Time   IRON 118 09/25/2019 1115   TIBC 454 (H) 09/25/2019 1115   FERRITIN 236 09/25/2019 1115   IRONPCTSAT 26 09/25/2019 1115      RADIOGRAPHIC STUDIES: I have personally reviewed the radiological images as listed and  agreed with the findings in the report.  CT CHEST ABDOMEN PELVIS WO CONTRAST  Result Date: 01/15/2021 CLINICAL DATA:   History of metastatic renal cell carcinoma, total nephrectomy 08/02/2018 and Keytruda immunotherapy infusions, restaging/follow-up. EXAM: CT CHEST, ABDOMEN AND PELVIS WITHOUT CONTRAST TECHNIQUE: Multidetector CT imaging of the chest, abdomen and pelvis was performed following the standard protocol without IV contrast. COMPARISON:  Multiple priors including most recent CT Sep 29, 2020 FINDINGS: CT CHEST FINDINGS Cardiovascular: Aortic atherosclerosis without aneurysmal dilation. Normal caliber central pulmonary arteries. Normal size heart. No significant pericardial effusion/thickening. Coronary artery calcifications. Mediastinum/Nodes: No discrete thyroid nodule. No pathologically enlarged mediastinal, hilar or axillary lymph nodes, noting limited sensitivity for the detection of hilar adenopathy on this noncontrast study. Lungs/Pleura: Stable indexed 4 mm left upper lobe pulmonary nodule on image 49/3. The additional milli metric scattered pulmonary nodules appears similar prior. No new suspicious pulmonary nodules or masses. No focal airspace consolidation. No pleural effusion. Pneumothorax. Musculoskeletal: No aggressive lytic or blastic of bone. No suspicious chest wall mass. CT ABDOMEN PELVIS FINDINGS Hepatobiliary: Unremarkable noncontrast appearance of the hepatic parenchyma. Gallbladder is unremarkable. No biliary ductal dilation. Pancreas: Within normal limits. Spleen: Within normal limits. Adrenals/Urinary Tract: Bilateral adrenal glands are unremarkable. Prior right nephrectomy without new suspicious soft tissue nodularity in the nephrectomy bed. No left hydronephrosis or nephrolithiasis. Stable hypodense 1.2 cm cortical lesion in the interpolar region left kidney on image 73/2, consistent with a cyst. Urinary bladder is unremarkable for degree of distension. Stomach/Bowel: Renal pain enteric contrast traverses distal small bowel. Stomach is unremarkable for degree distension. No pathologic dilation of  small large bowel. The appendix and terminal ileum appear unremarkable. No evidence of acute bowel inflammation. Vascular/Lymphatic: Aortic and branch vessel atherosclerosis without abdominal aortic aneurysm. No pathologically enlarged abdominopelvic lymph nodes. Reproductive: Prostate unremarkable. Other: No abdominopelvic ascites. Musculoskeletal: No aggressive lytic or blastic lesion of bone. No acute osseous abnormality. IMPRESSION: 1. Stable exam. No evidence of recurrent or new/progressive metastatic disease within the chest, abdomen, or pelvis. 2. Stable tiny bilateral pulmonary nodules measuring up to 4 mm. 3.  Aortic Atherosclerosis (ICD10-I70.0). Electronically Signed   By: Dahlia Bailiff M.D.   On: 01/15/2021 21:38       ASSESSMENT & PLAN:  1. Clear cell carcinoma of right kidney ([place])   2. Encounter for antineoplastic immunotherapy   3. 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 Labs reviewed and discussed with patient.  Proceed with Keytruda today.   Recent history of knee replacement.  Follow-up with orthopedic surgeon.  He is on aspirin 81 mg currently for DVT prophylaxis.  Tachycardia is likely due to pain.  Heart rate is improving.  106 today.  Follow-up in 3 weeks All questions were answered. The patient knows to call the clinic with any problems questions or concerns.  Earlie Server, MD, PhD 03/29/2021

## 2021-03-29 NOTE — Progress Notes (Signed)
Pulse Rate: 106. MD, Dr. Tasia Catchings, aware. Per MD order: okay to proceed with scheduled Keytruda treatment today.

## 2021-04-15 ENCOUNTER — Ambulatory Visit
Admission: RE | Admit: 2021-04-15 | Discharge: 2021-04-15 | Disposition: A | Discharge status: Home / Self Care | Payer: BC Managed Care – PPO | Source: Ambulatory Visit | Attending: Oncology | Admitting: Oncology

## 2021-04-15 ENCOUNTER — Other Ambulatory Visit: Payer: Self-pay

## 2021-04-15 DIAGNOSIS — C641 Malignant neoplasm of right kidney, except renal pelvis: Secondary | ICD-10-CM | POA: Insufficient documentation

## 2021-04-18 ENCOUNTER — Ambulatory Visit: Admission: RE | Admit: 2021-04-18 | Payer: BC Managed Care – PPO | Source: Ambulatory Visit

## 2021-04-19 ENCOUNTER — Other Ambulatory Visit: Payer: Self-pay

## 2021-04-19 ENCOUNTER — Inpatient Hospital Stay: Payer: BC Managed Care – PPO | Attending: Oncology

## 2021-04-19 ENCOUNTER — Inpatient Hospital Stay (HOSPITAL_BASED_OUTPATIENT_CLINIC_OR_DEPARTMENT_OTHER): Payer: BC Managed Care – PPO | Admitting: Oncology

## 2021-04-19 ENCOUNTER — Inpatient Hospital Stay: Payer: BC Managed Care – PPO

## 2021-04-19 ENCOUNTER — Encounter: Payer: Self-pay | Admitting: Oncology

## 2021-04-19 VITALS — BP 138/91 | HR 97 | Temp 96.9°F | Wt 207.0 lb

## 2021-04-19 VITALS — BP 133/81 | HR 94 | Resp 16

## 2021-04-19 DIAGNOSIS — C641 Malignant neoplasm of right kidney, except renal pelvis: Secondary | ICD-10-CM | POA: Insufficient documentation

## 2021-04-19 DIAGNOSIS — C7801 Secondary malignant neoplasm of right lung: Secondary | ICD-10-CM | POA: Insufficient documentation

## 2021-04-19 DIAGNOSIS — Z87891 Personal history of nicotine dependence: Secondary | ICD-10-CM | POA: Insufficient documentation

## 2021-04-19 DIAGNOSIS — C7911 Secondary malignant neoplasm of bladder: Secondary | ICD-10-CM | POA: Insufficient documentation

## 2021-04-19 DIAGNOSIS — L409 Psoriasis, unspecified: Secondary | ICD-10-CM | POA: Diagnosis not present

## 2021-04-19 DIAGNOSIS — Z79899 Other long term (current) drug therapy: Secondary | ICD-10-CM | POA: Diagnosis not present

## 2021-04-19 DIAGNOSIS — Z5112 Encounter for antineoplastic immunotherapy: Secondary | ICD-10-CM

## 2021-04-19 DIAGNOSIS — C7802 Secondary malignant neoplasm of left lung: Secondary | ICD-10-CM | POA: Diagnosis not present

## 2021-04-19 DIAGNOSIS — Z7982 Long term (current) use of aspirin: Secondary | ICD-10-CM | POA: Diagnosis not present

## 2021-04-19 DIAGNOSIS — R21 Rash and other nonspecific skin eruption: Secondary | ICD-10-CM

## 2021-04-19 DIAGNOSIS — R000 Tachycardia, unspecified: Secondary | ICD-10-CM | POA: Insufficient documentation

## 2021-04-19 DIAGNOSIS — Z96653 Presence of artificial knee joint, bilateral: Secondary | ICD-10-CM | POA: Insufficient documentation

## 2021-04-19 LAB — CBC WITH DIFFERENTIAL/PLATELET
Abs Immature Granulocytes: 0.05 10*3/uL (ref 0.00–0.07)
Basophils Absolute: 0.1 10*3/uL (ref 0.0–0.1)
Basophils Relative: 1 %
Eosinophils Absolute: 0.2 10*3/uL (ref 0.0–0.5)
Eosinophils Relative: 2 %
HCT: 45.3 % (ref 39.0–52.0)
Hemoglobin: 14.8 g/dL (ref 13.0–17.0)
Immature Granulocytes: 1 %
Lymphocytes Relative: 28 %
Lymphs Abs: 2.3 10*3/uL (ref 0.7–4.0)
MCH: 28.7 pg (ref 26.0–34.0)
MCHC: 32.7 g/dL (ref 30.0–36.0)
MCV: 88 fL (ref 80.0–100.0)
Monocytes Absolute: 0.6 10*3/uL (ref 0.1–1.0)
Monocytes Relative: 7 %
Neutro Abs: 5.1 10*3/uL (ref 1.7–7.7)
Neutrophils Relative %: 61 %
Platelets: 279 10*3/uL (ref 150–400)
RBC: 5.15 MIL/uL (ref 4.22–5.81)
RDW: 12.8 % (ref 11.5–15.5)
WBC: 8.2 10*3/uL (ref 4.0–10.5)
nRBC: 0 % (ref 0.0–0.2)

## 2021-04-19 LAB — COMPREHENSIVE METABOLIC PANEL
ALT: 22 U/L (ref 0–44)
AST: 22 U/L (ref 15–41)
Albumin: 4.6 g/dL (ref 3.5–5.0)
Alkaline Phosphatase: 93 U/L (ref 38–126)
Anion gap: 10 (ref 5–15)
BUN: 23 mg/dL — ABNORMAL HIGH (ref 6–20)
CO2: 29 mmol/L (ref 22–32)
Calcium: 10 mg/dL (ref 8.9–10.3)
Chloride: 96 mmol/L — ABNORMAL LOW (ref 98–111)
Creatinine, Ser: 1 mg/dL (ref 0.61–1.24)
GFR, Estimated: >60 mL/min (ref >60)
Glucose, Bld: 138 mg/dL — ABNORMAL HIGH (ref 70–99)
Potassium: 4.6 mmol/L (ref 3.5–5.1)
Sodium: 135 mmol/L (ref 135–145)
Total Bilirubin: 0.7 mg/dL (ref 0.3–1.2)
Total Protein: 8.1 g/dL (ref 6.5–8.1)

## 2021-04-19 MED ORDER — SODIUM CHLORIDE 0.9 % IV SOLN
Freq: Once | INTRAVENOUS | Status: AC
Start: 2021-04-19 — End: 2021-04-19
  Filled 2021-04-19: qty 250

## 2021-04-19 MED ORDER — SODIUM CHLORIDE 0.9 % IV SOLN
200.0000 mg | Freq: Once | INTRAVENOUS | Status: AC
  Administered 2021-04-19: 200 mg via INTRAVENOUS
  Filled 2021-04-19: qty 8

## 2021-04-19 NOTE — Patient Instructions (Signed)
[facility]  Discharge Instructions: °Thank you for choosing [facility] to provide your oncology and hematology care.  °If you have a lab appointment with the Cancer Center, please go directly to the Cancer Center and check in at the registration area. ° °Wear comfortable clothing and clothing appropriate for easy access to any Portacath or PICC line.  ° °We strive to give you quality time with your provider. You may need to reschedule your appointment if you arrive late (15 or more minutes).  Arriving late affects you and other patients whose appointments are after yours.  Also, if you miss three or more appointments without notifying the office, you may be dismissed from the clinic at the provider’s discretion.    °  °For prescription refill requests, have your pharmacy contact our office and allow 72 hours for refills to be completed.   ° °Today you received the following chemotherapy and/or immunotherapy agents Keytruda °    °  °To help prevent nausea and vomiting after your treatment, we encourage you to take your nausea medication as directed. ° °BELOW ARE SYMPTOMS THAT SHOULD BE REPORTED IMMEDIATELY: °*FEVER GREATER THAN 100.4 F (38 °C) OR HIGHER °*CHILLS OR SWEATING °*NAUSEA AND VOMITING THAT IS NOT CONTROLLED WITH YOUR NAUSEA MEDICATION °*UNUSUAL SHORTNESS OF BREATH °*UNUSUAL BRUISING OR BLEEDING °*URINARY PROBLEMS (pain or burning when urinating, or frequent urination) °*BOWEL PROBLEMS (unusual diarrhea, constipation, pain near the anus) °TENDERNESS IN MOUTH AND THROAT WITH OR WITHOUT PRESENCE OF ULCERS (sore throat, sores in mouth, or a toothache) °UNUSUAL RASH, SWELLING OR PAIN  °UNUSUAL VAGINAL DISCHARGE OR ITCHING  ° °Items with * indicate a potential emergency and should be followed up as soon as possible or go to the Emergency Department if any problems should occur. ° °Please show the CHEMOTHERAPY ALERT CARD or IMMUNOTHERAPY ALERT CARD at check-in to  the Emergency Department and triage nurse. ° °Should you have questions after your visit or need to cancel or reschedule your appointment, please contact [facility]  [phone] and follow the prompts.  Office hours are 8:00 a.m. to 4:30 p.m. Monday - Friday. Please note that voicemails left after 4:00 p.m. may not be returned until the following business day.  We are closed weekends and major holidays. You have access to a nurse at all times for urgent questions. Please call the main number to the clinic [phone] and follow the prompts. ° °For any non-urgent questions, you may also contact your provider using MyChart. We now offer e-Visits for anyone 18 and older to request care online for non-urgent symptoms. For details visit [email or web address]. °  °Also download the MyChart app! Go to the app store, search "MyChart", open the app, select [facility], and log in with your MyChart username and password. ° °Due to Covid, a mask is required upon entering the hospital/clinic. If you do not have a mask, one will be given to you upon arrival. For doctor visits, patients may have 1 support person aged 18 or older with them. For treatment visits, patients cannot have anyone with them due to current Covid guidelines and our immunocompromised population.  °

## 2021-04-19 NOTE — Progress Notes (Signed)
Hematology/Oncology follow up note [phone] [phone]   Patient Care Team: Danae Orleans, MD as PCP - General (Internal Medicine) [name], [name], MD as PCP - Family Medicine (Family Medicine) [name], MD as Consulting Physician (General Surgery) [name], [name], MD as Attending Physician (Family Medicine) [name], [name], MD as Consulting Physician (Dermatology)  REFERRING PROVIDER: Danae Orleans, MD  CHIEF COMPLAINTS/REASON FOR VISIT:  Follow-up of kidney cancer  HISTORY OF PRESENTING ILLNESS:   Matthew Woodard is a  55 y.o.  male with PMH listed below was seen in consultation at the request of  Danae Orleans, MD  for evaluation of kidney cancer Patient's cancer history dated back to February 2020 when he developed gross hematuria with small clots and right-sided flank pain.  Patient had a CT urogram done which showed right 4 cm central enhancing renal mass with invasion into the collecting system.  X-ray of chest was performed to see complete staging which showed no concerning findings. 06/28/2018 patient underwent a cystoscopy, bladder biopsy and a right retrograde pyelogram with intraoperative interpretation, right diagnostic ureteroscopy, right renal pelvis biopsy, right ureteral stent placement.  biopsy showed atypical cell clusters, with extensive crush artifact, nondiagnostic.   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  Surgical margins negative for tumor.pT3a,Nx Patient has been on surveillance after surgery. 10/28/2018 CT abdomen pelvis with contrast showed minimal fluid and or postoperative changes within the right renal fossa.  No evidence of metastatic disease or recurrence.  Patient has left kidney lesion previously characterized as nonenhancing cyst by  multi phasic contrast-enhanced CT. Patient reports an episode of gross hematuria in early December 2020, patient is due for 6-month surveillance CT scan. 04/30/2019 CT abdomen pelvis with contrast showed high attenuation mass at the right uretero vesicle junction, with extension into the bladder.  Bilateral pulmonary nodules, most indicated for metastatic disease.   Patient underwent cystoscopy and transurethral resection of irregular bladder tumor 3 cm.  Mass appeared to emanate from the right ureteral orifice.  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 Patient was referred to me for further work-up and management.  #Staging chest CT with contrast showed numerous bilateral pulmonary nodules.  Compatible with metastatic disease. Bone scan showed no osseous metastatic disease.   #History of tobacco abuse, former smoker.  Quit in 2015.  He denies any hemoptysis, chest pain, shortness of breath today. Patient reports that hematuria has completely resolved.  Denies any pain today. He is accompanied by his wife.  #History of cutaneous psoriasis, mostly on his right hand, currently on weekly methotrexate 12.5 mg with good symptom control. #NGS: [facility] PD L1 TPS 1%  # patient was referred to ENT for evaluation of headache and sinus pressure.  He had a ENT evaluation and feels that patient does not have sinusitis.  [name] recommended MRI brain to rule out PRES syndrome.  Axitinib has been held MRI brain was done and was negative.   #09/2019 off axitinib and Keytruda since the beginning of May due to transaminitis. Patient was seen and evaluated by gastroenterology [name]. Work-up was negative. Ultrasound liver vascular Doppler is negative. Recommend observation. #11/24/2019, resumed on Keytruda every 3 weeks #01/05/2020, axitinib was resumed 3 mg #01/20/2020, axitinib was discontinued due to recurrence of transaminitis  Beryle Flock was temporarily  held. #03/16/2020, resumed on Keytruda  every 3 weeks. 09/29/2020 CT scan showed no evidence of local recurrence or new/progressive disease in the chest abdomen or pelvis.  Tiny nonspecific lung nodules are unchanged.  No new lung nodules. May 2022, left knee replacement 01/14/2021 CT chest abdomen pelvis showed stable exam, no new or progressive metastatic disease within chest abdomen pelvis. Stable lung nodules. Aortic atherosclerosis.  Sept 2022 right knee replacement  INTERVAL HISTORY Matthew Woodard is a 55 y.o. male who has above history reviewed by me today presents for follow up visit for management of metastatic RCC. Patient has no new complaints. Patient denies any shortness of breath, chest pain or leg swelling.  Patient plans to go back to work in January 2023.   Review of Systems  Constitutional:  Negative for appetite change, chills, fatigue, fever and unexpected weight change.  HENT:   Negative for hearing loss and voice change.   Eyes:  Negative for eye problems and icterus.  Respiratory:  Negative for chest tightness, cough and shortness of breath.   Cardiovascular:  Negative for chest pain and leg swelling.  Gastrointestinal:  Negative for abdominal distention, abdominal pain, blood in stool and nausea.  Endocrine: Negative for hot flashes.  Genitourinary:  Negative for difficulty urinating, dysuria and frequency.   Musculoskeletal:  Positive for arthralgias.       Right knee replacement  Skin:  Negative for itching and rash.  Neurological:  Negative for extremity weakness, headaches, light-headedness and numbness.  Hematological:  Negative for adenopathy. Does not bruise/bleed easily.  Psychiatric/Behavioral:  Negative for confusion.    MEDICAL HISTORY:  Past Medical History:  Diagnosis Date   Arthritis    hands, left knee   Cancer ([place])    Diabetes mellitus without complication ([place])    Eczema 01/08/2017   GERD (gastroesophageal reflux disease)    History of  kidney cancer    Hyperlipidemia LDL goal <100 11/02/2014   Hypertension    Kidney stone    YEAR H/O HEMATURIA   Psoriasis    Wears dentures    full upper, partial lower.  Has, does not wear    SURGICAL HISTORY: Past Surgical History:  Procedure Laterality Date   COLONOSCOPY WITH PROPOFOL N/A 02/23/2017   Procedure: COLONOSCOPY WITH PROPOFOL;  Surgeon: [name], MD;  Location: [facility];  Service: Endoscopy;  Laterality: N/A;  Diabetic - oral meds   CYST EXCISION  Aug. 2016   on back   CYSTOSCOPY W/ RETROGRADES Right 06/28/2018   Procedure: CYSTOSCOPY WITH RETROGRADE PYELOGRAM;  Surgeon: Billey Co, MD;  Location: [facility];  Service: Urology;  Laterality: Right;   CYSTOSCOPY W/ URETERAL STENT REMOVAL Right 07/22/2018   Procedure: CYSTOSCOPY WITH STENT REMOVAL;  Surgeon: Billey Co, MD;  Location: [facility];  Service: Urology;  Laterality: Right;   CYSTOSCOPY WITH BIOPSY Right 06/28/2018   Procedure: CYSTOSCOPY WITH RENAL BIOPSY;  Surgeon: Billey Co, MD;  Location: [facility];  Service: Urology;  Laterality: Right;   CYSTOSCOPY WITH URETEROSCOPY AND STENT PLACEMENT Right 06/28/2018   Procedure: CYSTOSCOPY WITH URETEROSCOPY AND STENT PLACEMENT;  Surgeon: Billey Co, MD;  Location: [facility];  Service: Urology;  Laterality: Right;   FULGURATION OF BLADDER TUMOR N/A 06/28/2018   Procedure: CYSTOSCOPY BLADDER BIOPSY, FULGERATION OF BLADDER;  Surgeon: Billey Co, MD;  Location: [facility];  Service: Urology;  Laterality: N/A;   KNEE SURGERY Left    LAPAROSCOPIC NEPHRECTOMY, HAND ASSISTED Right 07/22/2018   Procedure: HAND ASSISTED LAPAROSCOPIC NEPHRECTOMY;  Surgeon: Billey Co, MD;  Location: [facility];  Service: Urology;  Laterality: Right;   NASAL SINUS SURGERY  03/15   POLYPECTOMY N/A 02/23/2017   Procedure: POLYPECTOMY;  Surgeon: [name], MD;  Location: [facility];  Service: Endoscopy;  Laterality: N/A;   tooth abstraction      TRANSURETHRAL RESECTION OF BLADDER TUMOR N/A 05/12/2019   Procedure: TRANSURETHRAL RESECTION OF BLADDER TUMOR (TURBT);  Surgeon: Billey Co, MD;  Location: [facility];  Service: Urology;  Laterality: N/A;   VARICOCELE EXCISION     XI ROBOTIC ASSISTED VENTRAL HERNIA N/A 11/18/2020   Procedure: XI ROBOTIC ASSISTED VENTRAL HERNIA, incisional;  Surgeon: Jules Husbands, MD;  Location: [facility];  Service: General;  Laterality: N/A;    SOCIAL HISTORY: Social History   Socioeconomic History   Marital status: Married    Spouse name: Alma Friendly   Number of children: Not on file   Years of education: Not on file   Highest education level: Not on file  Occupational History   Not on file  Tobacco Use   Smoking status: Former    Types: Cigarettes    Quit date: 06/15/2013    Years since quitting: 7.8   Smokeless tobacco: Never  Vaping Use   Vaping Use: Never used  Substance and Sexual Activity   Alcohol use: No    Alcohol/week: 0.0 standard drinks   Drug use: No   Sexual activity: Yes  Other Topics Concern   Not on file  Social History Narrative   Lives with wife, sister in law   Social Determinants of Health   Financial Resource Strain: Not on file  Food Insecurity: Not on file  Transportation Needs: Not on file  Physical Activity: Not on file  Stress: Not on file  Social Connections: Not on file  Intimate Partner Violence: Not on file    FAMILY HISTORY: Family History  Problem Relation Age of Onset   Heart disease Father    Hypertension Father    COPD Father    Colon cancer Maternal Grandmother    Heart attack Paternal Grandfather     ALLERGIES:  is allergic to glucotrol [glipizide].  MEDICATIONS:  Current Outpatient Medications  Medication Sig Dispense Refill   aspirin 81 MG EC tablet Take 81 mg by mouth daily with supper.     clobetasol ointment (TEMOVATE) 2.83 % Apply 1 application topically 2 (two) times daily as needed for rash.     cyclobenzaprine (FLEXERIL) 10 MG  tablet Take 10 mg by mouth 3 (three) times daily as needed for muscle spasms.     Glucose Blood (ACCU-CHEK AVIVA PLUS VI) by In Vitro route.     HYDROcodone-acetaminophen (NORCO/VICODIN) 5-325 MG tablet Take 1-2 tablets by mouth every 4 (four) hours as needed for moderate pain. 20 tablet 0   JARDIANCE 10 MG TABS tablet TAKE 1 TABLET BY MOUTH DAILY 30 tablet 2   lisinopril (PRINIVIL,ZESTRIL) 10 MG tablet TAKE 1 TABLET(10 MG) BY MOUTH DAILY (Patient taking differently: Take 10 mg by mouth daily with supper.) 90 tablet 1   metFORMIN (GLUCOPHAGE-XR) 500 MG 24 hr tablet Take 3 pills by mouth daily for 4-5 days, then increase to 4 pills daily (if tolerated) (Patient taking differently: Take 1,000 mg by mouth daily with supper.) 360 tablet 0   Multiple Vitamin (MULTIVITAMIN) tablet Take 1 tablet by mouth daily with supper.     omeprazole (PRILOSEC) 20 MG capsule Take 20 mg by mouth daily as needed ([place]).  Oxycodone HCl 10 MG TABS Take 10 mg by mouth every 4 (four) hours.     OZEMPIC, 0.25 OR 0.5 MG/DOSE, 2 MG/1.5ML SOPN Inject 1 mg into the skin every Sunday.     prochlorperazine (COMPAZINE) 10 MG tablet Take 1 tablet (10 mg total) by mouth every 6 (six) hours as needed for nausea or vomiting. 15 tablet 0   rosuvastatin (CRESTOR) 20 MG tablet Take 20 mg by mouth daily with supper.     traZODone (DESYREL) 50 MG tablet TAKE 1 TABLET(50 MG) BY MOUTH AT BEDTIME 30 tablet 3   No current facility-administered medications for this visit.     PHYSICAL EXAMINATION: ECOG PERFORMANCE STATUS: 1 - Symptomatic but completely ambulatory Vitals:   04/19/21 0945  BP: (!) 138/91  Pulse: 97  Temp: (!) 96.9 F (36.1 C)   Filed Weights   04/19/21 0945  Weight: 207 lb (93.9 kg)    Physical Exam Constitutional:      General: He is not in acute distress.    Comments: Patient walks with a walker  HENT:     Head: Normocephalic and atraumatic.  Eyes:     General: No scleral icterus.    Pupils: Pupils  are equal, round, and reactive to light.  Cardiovascular:     Rate and Rhythm: Regular rhythm. Tachycardia present.     Heart sounds: Normal heart sounds.  Pulmonary:     Effort: Pulmonary effort is normal. No respiratory distress.     Breath sounds: No wheezing.  Abdominal:     General: Bowel sounds are normal. There is no distension.     Palpations: Abdomen is soft. There is no mass.     Tenderness: There is no abdominal tenderness.  Musculoskeletal:        General: No deformity. Normal range of motion.     Cervical back: Normal range of motion and neck supple.     Comments: Right knee status post knee replacement.   Skin:    General: Skin is warm and dry.     Findings: No erythema or rash.  Neurological:     Mental Status: He is alert and oriented to person, place, and time. Mental status is at baseline.     Cranial Nerves: No cranial nerve deficit.     Coordination: Coordination normal.  Psychiatric:        Mood and Affect: Mood normal.    LABORATORY DATA:  I have reviewed the data as listed Lab Results  Component Value Date   WBC 8.2 04/19/2021   HGB 14.8 04/19/2021   HCT 45.3 04/19/2021   MCV 88.0 04/19/2021   PLT 279 04/19/2021   Recent Labs    03/08/21 0904 03/29/21 1352 04/19/21 0921  NA 133* 134* 135  K 4.4 4.3 4.6  CL 97* 96* 96*  CO2 26 28 29   GLUCOSE 116* 185* 138*  BUN 22* 17 23*  CREATININE 0.87 0.99 1.00  CALCIUM 9.8 9.6 10.0  GFRNONAA >60 >60 >60  PROT 8.0 7.7 8.1  ALBUMIN 4.2 4.2 4.6  AST 17 18 22   ALT 15 18 22   ALKPHOS 88 87 93  BILITOT 0.5 0.2* 0.7    Iron/TIBC/Ferritin/ %Sat    Component Value Date/Time   IRON 118 09/25/2019 1115   TIBC 454 (H) 09/25/2019 1115   FERRITIN 236 09/25/2019 1115   IRONPCTSAT 26 09/25/2019 1115      RADIOGRAPHIC STUDIES: I have personally reviewed the radiological images as listed and agreed with the  findings in the report.  CT CHEST ABDOMEN PELVIS WO CONTRAST  Result Date: 04/18/2021 CLINICAL  DATA:  Follow-up metastatic renal cell carcinoma. Undergoing immunotherapy. EXAM: CT CHEST, ABDOMEN AND PELVIS WITHOUT CONTRAST TECHNIQUE: Multidetector CT imaging of the chest, abdomen and pelvis was performed following the standard protocol without IV contrast. COMPARISON:  01/14/2021 FINDINGS: CT CHEST FINDINGS Cardiovascular: No acute findings. Aortic and coronary atherosclerotic calcification noted. Mediastinum/Lymph Nodes: No masses or pathologically enlarged lymph nodes identified on this unenhanced exam. Lungs/Pleura: A few tiny less than 5 mm pulmonary nodules are again seen in both upper lobes, which remains stable. No new or enlarging pulmonary nodules or masses identified. Mild pleural-parenchymal scarring is again seen bilaterally. No evidence of pulmonary infiltrate or pleural effusion. Musculoskeletal:  No suspicious bone lesions identified. CT ABDOMEN AND PELVIS FINDINGS Hepatobiliary: No masses visualized on this unenhanced exam. Gallbladder is unremarkable. No evidence of biliary ductal dilatation. Pancreas: No mass or inflammatory changes identified on this unenhanced exam. Spleen:  Within normal limits in size. Adrenals/Urinary Tract: Normal adrenal glands. Prior right nephrectomy. Small low and high attenuation lesions in the posterior interpolar region of the left kidney remains stable, and have previously been characterized as Bosniak category 1 and 2 cysts on prior CT on 06/21/2018. No new renal lesions are seen on this unenhanced exam. No evidence of urolithiasis or hydronephrosis. Unremarkable unopacified urinary bladder. Stomach/Bowel: No evidence of obstruction, inflammatory process, or abnormal fluid collections. Normal appendix visualized. Vascular/Lymphatic: No pathologically enlarged lymph nodes identified. No abdominal aortic aneurysm. Aortic atherosclerotic calcification noted. Reproductive:  No masses or other significant abnormality. Other:  None. Musculoskeletal:  No suspicious  bone lesions identified. IMPRESSION: Stable exam. No evidence of recurrent or metastatic carcinoma within the chest, abdomen, or pelvis. Electronically Signed   By: Marlaine Hind M.D.   On: 04/18/2021 09:03       ASSESSMENT & PLAN:  1. Clear cell carcinoma of right kidney ([place])   2. 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 Labs are reviewed and discussed with patient.  Proceed with Keytruda today 04/15/2021, CT chest abdomen pelvis with contrast showed stable examination.  No evidence of recurrent or metastatic carcinoma within the chest abdomen pelvis. I think at this point he is doing very well clinically.  He is able to go back to work.  Recent history of knee replacement.  Follow-up with orthopedic surgeon.  He is on aspirin 81 mg currently for DVT prophylaxis.  Follow-up in 3 weeks All questions were answered. The patient knows to call the clinic with any problems questions or concerns.  Earlie Server, MD, PhD 04/19/2021

## 2021-04-25 ENCOUNTER — Ambulatory Visit: Payer: BC Managed Care – PPO | Admitting: Oncology

## 2021-04-25 ENCOUNTER — Other Ambulatory Visit: Payer: BC Managed Care – PPO

## 2021-04-25 ENCOUNTER — Ambulatory Visit: Payer: BC Managed Care – PPO

## 2021-04-27 ENCOUNTER — Encounter: Payer: Self-pay | Admitting: Oncology

## 2021-04-28 NOTE — Telephone Encounter (Signed)
Please advise 

## 2021-05-01 ENCOUNTER — Other Ambulatory Visit: Payer: Self-pay

## 2021-05-10 ENCOUNTER — Other Ambulatory Visit: Payer: Self-pay

## 2021-05-10 ENCOUNTER — Inpatient Hospital Stay: Payer: BC Managed Care – PPO

## 2021-05-10 ENCOUNTER — Encounter: Payer: Self-pay | Admitting: Oncology

## 2021-05-10 ENCOUNTER — Inpatient Hospital Stay (HOSPITAL_BASED_OUTPATIENT_CLINIC_OR_DEPARTMENT_OTHER): Payer: BC Managed Care – PPO | Admitting: Oncology

## 2021-05-10 VITALS — BP 135/93 | HR 125 | Temp 98.5°F | Wt 214.0 lb

## 2021-05-10 DIAGNOSIS — C641 Malignant neoplasm of right kidney, except renal pelvis: Secondary | ICD-10-CM | POA: Diagnosis not present

## 2021-05-10 DIAGNOSIS — Z5112 Encounter for antineoplastic immunotherapy: Secondary | ICD-10-CM | POA: Diagnosis not present

## 2021-05-10 DIAGNOSIS — R000 Tachycardia, unspecified: Secondary | ICD-10-CM

## 2021-05-10 DIAGNOSIS — R21 Rash and other nonspecific skin eruption: Secondary | ICD-10-CM

## 2021-05-10 LAB — COMPREHENSIVE METABOLIC PANEL
ALT: 22 U/L (ref 0–44)
AST: 24 U/L (ref 15–41)
Albumin: 4.6 g/dL (ref 3.5–5.0)
Alkaline Phosphatase: 87 U/L (ref 38–126)
Anion gap: 10 (ref 5–15)
BUN: 21 mg/dL — ABNORMAL HIGH (ref 6–20)
CO2: 26 mmol/L (ref 22–32)
Calcium: 9.4 mg/dL (ref 8.9–10.3)
Chloride: 97 mmol/L — ABNORMAL LOW (ref 98–111)
Creatinine, Ser: 1.05 mg/dL (ref 0.61–1.24)
GFR, Estimated: >60 mL/min (ref >60)
Glucose, Bld: 139 mg/dL — ABNORMAL HIGH (ref 70–99)
Potassium: 4.1 mmol/L (ref 3.5–5.1)
Sodium: 133 mmol/L — ABNORMAL LOW (ref 135–145)
Total Bilirubin: 0.6 mg/dL (ref 0.3–1.2)
Total Protein: 7.9 g/dL (ref 6.5–8.1)

## 2021-05-10 LAB — CBC WITH DIFFERENTIAL/PLATELET
Abs Immature Granulocytes: 0.07 10*3/uL (ref 0.00–0.07)
Basophils Absolute: 0 10*3/uL (ref 0.0–0.1)
Basophils Relative: 0 %
Eosinophils Absolute: 0.2 10*3/uL (ref 0.0–0.5)
Eosinophils Relative: 2 %
HCT: 45.2 % (ref 39.0–52.0)
Hemoglobin: 14.7 g/dL (ref 13.0–17.0)
Immature Granulocytes: 1 %
Lymphocytes Relative: 11 %
Lymphs Abs: 0.8 10*3/uL (ref 0.7–4.0)
MCH: 28.7 pg (ref 26.0–34.0)
MCHC: 32.5 g/dL (ref 30.0–36.0)
MCV: 88.3 fL (ref 80.0–100.0)
Monocytes Absolute: 0.6 10*3/uL (ref 0.1–1.0)
Monocytes Relative: 8 %
Neutro Abs: 5.8 10*3/uL (ref 1.7–7.7)
Neutrophils Relative %: 78 %
Platelets: 210 10*3/uL (ref 150–400)
RBC: 5.12 MIL/uL (ref 4.22–5.81)
RDW: 13.4 % (ref 11.5–15.5)
WBC: 7.4 10*3/uL (ref 4.0–10.5)
nRBC: 0 % (ref 0.0–0.2)

## 2021-05-10 LAB — T4, FREE: Free T4: 0.86 ng/dL (ref 0.61–1.12)

## 2021-05-10 LAB — TSH: TSH: 0.67 u[IU]/mL (ref 0.350–4.500)

## 2021-05-10 MED ORDER — SODIUM CHLORIDE 0.9 % IV SOLN
Freq: Once | INTRAVENOUS | Status: AC
  Filled 2021-05-10: qty 250

## 2021-05-10 MED ORDER — SODIUM CHLORIDE 0.9 % IV SOLN
200.0000 mg | Freq: Once | INTRAVENOUS | Status: AC
  Administered 2021-05-10: 13:00:00 200 mg via INTRAVENOUS
  Filled 2021-05-10: qty 8

## 2021-05-10 NOTE — Progress Notes (Signed)
Hematology/Oncology follow up note [phone] [phone]   Patient Care Team: Danae Orleans, MD as PCP - General (Internal Medicine) [name], [name], MD as PCP - Family Medicine (Family Medicine) [name], MD as Consulting Physician (General Surgery) [name], [name], MD as Attending Physician (Family Medicine) [name], [name], MD as Consulting Physician (Dermatology)  REFERRING PROVIDER: Danae Orleans, MD  CHIEF COMPLAINTS/REASON FOR VISIT:  Follow-up of kidney cancer  HISTORY OF PRESENTING ILLNESS:   Matthew Woodard is a  55 y.o.  male with PMH listed below was seen in consultation at the request of  Danae Orleans, MD  for evaluation of kidney cancer Patient's cancer history dated back to February 2020 when he developed gross hematuria with small clots and right-sided flank pain.  Patient had a CT urogram done which showed right 4 cm central enhancing renal mass with invasion into the collecting system.  X-ray of chest was performed to see complete staging which showed no concerning findings. 06/28/2018 patient underwent a cystoscopy, bladder biopsy and a right retrograde pyelogram with intraoperative interpretation, right diagnostic ureteroscopy, right renal pelvis biopsy, right ureteral stent placement.  biopsy showed atypical cell clusters, with extensive crush artifact, nondiagnostic.   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  Surgical margins negative for tumor.pT3a,Nx Patient has been on surveillance after surgery. 10/28/2018 CT abdomen pelvis with contrast showed minimal fluid and or postoperative changes within the right renal fossa.  No evidence of metastatic disease or recurrence.  Patient has left kidney lesion previously characterized as nonenhancing cyst by  multi phasic contrast-enhanced CT. Patient reports an episode of gross hematuria in early December 2020, patient is due for 52-month surveillance CT scan. 04/30/2019 CT abdomen pelvis with contrast showed high attenuation mass at the right uretero vesicle junction, with extension into the bladder.  Bilateral pulmonary nodules, most indicated for metastatic disease.   Patient underwent cystoscopy and transurethral resection of irregular bladder tumor 3 cm.  Mass appeared to emanate from the right ureteral orifice.  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 Patient was referred to me for further work-up and management.  #Staging chest CT with contrast showed numerous bilateral pulmonary nodules.  Compatible with metastatic disease. Bone scan showed no osseous metastatic disease.   #History of tobacco abuse, former smoker.  Quit in 2015.  He denies any hemoptysis, chest pain, shortness of breath today. Patient reports that hematuria has completely resolved.  Denies any pain today. He is accompanied by his wife.  #History of cutaneous psoriasis, mostly on his right hand, currently on weekly methotrexate 12.5 mg with good symptom control. #NGS: [facility] PD L1 TPS 1%  # patient was referred to ENT for evaluation of headache and sinus pressure.  He had a ENT evaluation and feels that patient does not have sinusitis.  [name] recommended MRI brain to rule out PRES syndrome.  Axitinib has been held MRI brain was done and was negative.   #09/2019 off axitinib and Keytruda since the beginning of May due to transaminitis. Patient was seen and evaluated by gastroenterology [name]. Work-up was negative. Ultrasound liver vascular Doppler is negative. Recommend observation. #11/24/2019, resumed on Keytruda every 3 weeks #01/05/2020, axitinib was resumed 3 mg #01/20/2020, axitinib was discontinued due to recurrence of transaminitis  Beryle Flock was temporarily  held. #03/16/2020, resumed on Keytruda  every 3 weeks. 09/29/2020 CT scan showed no evidence of local recurrence or new/progressive disease in the chest abdomen or pelvis.  Tiny nonspecific lung nodules are unchanged.  No new lung nodules. May 2022, left knee replacement 01/14/2021 CT chest abdomen pelvis showed stable exam, no new or progressive metastatic disease within chest abdomen pelvis. Stable lung nodules. Aortic atherosclerosis.  Sept 2022 right knee replacement 04/15/2021, CT chest abdomen pelvis with contrast showed stable examination.  No evidence of recurrent or metastatic carcinoma within the chest abdomen pelvis. INTERVAL HISTORY Matthew Woodard is a 55 y.o. male who has above history reviewed by me today presents for follow up visit for management of metastatic RCC. Patient has no new complaints. Patient denies any shortness of breath, chest pain or leg swelling.  Patient plans to go back to work in January 2023. Heart rate was high in the clinic at 125.  Patient attributes to walking around prior to sitting down in the clinic alone.    Review of Systems  Constitutional:  Negative for appetite change, chills, fatigue, fever and unexpected weight change.  HENT:   Negative for hearing loss and voice change.   Eyes:  Negative for eye problems and icterus.  Respiratory:  Negative for chest tightness, cough and shortness of breath.   Cardiovascular:  Negative for chest pain and leg swelling.  Gastrointestinal:  Negative for abdominal distention, abdominal pain, blood in stool and nausea.  Endocrine: Negative for hot flashes.  Genitourinary:  Negative for difficulty urinating, dysuria and frequency.   Musculoskeletal:  Positive for arthralgias.       Right knee replacement  Skin:  Negative for itching and rash.  Neurological:  Negative for extremity weakness, headaches, light-headedness and numbness.  Hematological:  Negative for adenopathy. Does not bruise/bleed easily.   Psychiatric/Behavioral:  Negative for confusion.    MEDICAL HISTORY:  Past Medical History:  Diagnosis Date   Arthritis    hands, left knee   Cancer ([place])    Diabetes mellitus without complication ([place])    Eczema 01/08/2017   GERD (gastroesophageal reflux disease)    History of kidney cancer    Hyperlipidemia LDL goal <100 11/02/2014   Hypertension    Kidney stone    YEAR H/O HEMATURIA   Psoriasis    Wears dentures    full upper, partial lower.  Has, does not wear    SURGICAL HISTORY: Past Surgical History:  Procedure Laterality Date   COLONOSCOPY WITH PROPOFOL N/A 02/23/2017   Procedure: COLONOSCOPY WITH PROPOFOL;  Surgeon: [name], MD;  Location: [place];  Service: Endoscopy;  Laterality: N/A;  Diabetic - oral meds   CYST EXCISION  Aug. 2016   on back   CYSTOSCOPY W/ RETROGRADES Right 06/28/2018   Procedure: CYSTOSCOPY WITH RETROGRADE PYELOGRAM;  Surgeon: Billey Co, MD;  Location: [facility];  Service: Urology;  Laterality: Right;   CYSTOSCOPY W/ URETERAL STENT REMOVAL Right 07/22/2018   Procedure: CYSTOSCOPY WITH STENT REMOVAL;  Surgeon: Billey Co, MD;  Location: [facility];  Service: Urology;  Laterality: Right;   CYSTOSCOPY WITH BIOPSY Right 06/28/2018   Procedure: CYSTOSCOPY WITH RENAL BIOPSY;  Surgeon: Billey Co, MD;  Location: [facility];  Service: Urology;  Laterality: Right;   CYSTOSCOPY WITH URETEROSCOPY AND STENT PLACEMENT Right 06/28/2018   Procedure: CYSTOSCOPY WITH URETEROSCOPY AND STENT PLACEMENT;  Surgeon: Billey Co, MD;  Location: [facility];  Service: Urology;  Laterality: Right;   FULGURATION OF BLADDER TUMOR N/A 06/28/2018  Procedure: CYSTOSCOPY BLADDER BIOPSY, FULGERATION OF BLADDER;  Surgeon: Billey Co, MD;  Location: [facility];  Service: Urology;  Laterality: N/A;   KNEE SURGERY Left    LAPAROSCOPIC NEPHRECTOMY, HAND ASSISTED Right 07/22/2018   Procedure: HAND ASSISTED LAPAROSCOPIC NEPHRECTOMY;  Surgeon: Billey Co, MD;  Location: [facility];  Service: Urology;  Laterality: Right;   NASAL SINUS SURGERY  03/15   POLYPECTOMY N/A 02/23/2017   Procedure: POLYPECTOMY;  Surgeon: [name], MD;  Location: [place];  Service: Endoscopy;  Laterality: N/A;   tooth abstraction     TRANSURETHRAL RESECTION OF BLADDER TUMOR N/A 05/12/2019   Procedure: TRANSURETHRAL RESECTION OF BLADDER TUMOR (TURBT);  Surgeon: Billey Co, MD;  Location: [facility];  Service: Urology;  Laterality: N/A;   VARICOCELE EXCISION     XI ROBOTIC ASSISTED VENTRAL HERNIA N/A 11/18/2020   Procedure: XI ROBOTIC ASSISTED VENTRAL HERNIA, incisional;  Surgeon: Jules Husbands, MD;  Location: [facility];  Service: General;  Laterality: N/A;    SOCIAL HISTORY: Social History   Socioeconomic History   Marital status: Married    Spouse name: Alma Friendly   Number of children: Not on file   Years of education: Not on file   Highest education level: Not on file  Occupational History   Not on file  Tobacco Use   Smoking status: Former    Types: Cigarettes    Quit date: 06/15/2013    Years since quitting: 7.9   Smokeless tobacco: Never  Vaping Use   Vaping Use: Never used  Substance and Sexual Activity   Alcohol use: No    Alcohol/week: 0.0 standard drinks   Drug use: No   Sexual activity: Yes  Other Topics Concern   Not on file  Social History Narrative   Lives with wife, sister in law   Social Determinants of Health   Financial Resource Strain: Not on file  Food Insecurity: Not on file  Transportation Needs: Not on file  Physical Activity: Not on file  Stress: Not on file  Social Connections: Not on file  Intimate Partner Violence: Not on file    FAMILY HISTORY: Family History  Problem Relation Age of Onset   Heart disease Father    Hypertension Father    COPD Father    Colon cancer Maternal Grandmother    Heart attack Paternal Grandfather     ALLERGIES:  is allergic to glucotrol  [glipizide].  MEDICATIONS:  Current Outpatient Medications  Medication Sig Dispense Refill   aspirin 81 MG EC tablet Take 81 mg by mouth daily with supper.     clobetasol ointment (TEMOVATE) 9.73 % Apply 1 application topically 2 (two) times daily as needed for rash.     cyclobenzaprine (FLEXERIL) 10 MG tablet Take 10 mg by mouth 3 (three) times daily as needed for muscle spasms.     Glucose Blood (ACCU-CHEK AVIVA PLUS VI) by In Vitro route.     HYDROcodone-acetaminophen (NORCO/VICODIN) 5-325 MG tablet Take 1-2 tablets by mouth every 4 (four) hours as needed for moderate pain. 20 tablet 0   JARDIANCE 10 MG TABS tablet TAKE 1 TABLET BY MOUTH DAILY 30 tablet 2   lisinopril (PRINIVIL,ZESTRIL) 10 MG tablet TAKE 1 TABLET(10 MG) BY MOUTH DAILY (Patient taking differently: Take 10 mg by mouth daily with supper.) 90 tablet 1   metFORMIN (GLUCOPHAGE-XR) 500 MG 24 hr tablet Take 3 pills by mouth daily for 4-5 days, then increase to 4 pills daily (if tolerated) (Patient  taking differently: Take 1,000 mg by mouth daily with supper.) 360 tablet 0   Multiple Vitamin (MULTIVITAMIN) tablet Take 1 tablet by mouth daily with supper.     omeprazole (PRILOSEC) 20 MG capsule Take 20 mg by mouth daily as needed ([place]).     Oxycodone HCl 10 MG TABS Take 10 mg by mouth every 4 (four) hours.     OZEMPIC, 0.25 OR 0.5 MG/DOSE, 2 MG/1.5ML SOPN Inject 1 mg into the skin every Sunday.     prochlorperazine (COMPAZINE) 10 MG tablet Take 1 tablet (10 mg total) by mouth every 6 (six) hours as needed for nausea or vomiting. 15 tablet 0   rosuvastatin (CRESTOR) 20 MG tablet Take 20 mg by mouth daily with supper.     traZODone (DESYREL) 50 MG tablet TAKE 1 TABLET(50 MG) BY MOUTH AT BEDTIME 30 tablet 3   No current facility-administered medications for this visit.     PHYSICAL EXAMINATION: ECOG PERFORMANCE STATUS: 1 - Symptomatic but completely ambulatory Vitals:   05/10/21 0947  BP: (!) 135/93  Pulse: (!) 125  Temp:  98.5 F (36.9 C)  SpO2: 100%   Filed Weights   05/10/21 0947  Weight: 214 lb (97.1 kg)    Physical Exam Constitutional:      General: He is not in acute distress.    Comments: Patient walks with a walker  HENT:     Head: Normocephalic and atraumatic.  Eyes:     General: No scleral icterus.    Pupils: Pupils are equal, round, and reactive to light.  Cardiovascular:     Rate and Rhythm: Regular rhythm. Tachycardia present.     Heart sounds: Normal heart sounds.  Pulmonary:     Effort: Pulmonary effort is normal. No respiratory distress.     Breath sounds: No wheezing.  Abdominal:     General: Bowel sounds are normal. There is no distension.     Palpations: Abdomen is soft. There is no mass.     Tenderness: There is no abdominal tenderness.  Musculoskeletal:        General: No deformity. Normal range of motion.     Cervical back: Normal range of motion and neck supple.     Comments: Right knee status post knee replacement.   Skin:    General: Skin is warm and dry.     Findings: No erythema or rash.  Neurological:     Mental Status: He is alert and oriented to person, place, and time. Mental status is at baseline.     Cranial Nerves: No cranial nerve deficit.     Coordination: Coordination normal.  Psychiatric:        Mood and Affect: Mood normal.    LABORATORY DATA:  I have reviewed the data as listed Lab Results  Component Value Date   WBC 7.4 05/10/2021   HGB 14.7 05/10/2021   HCT 45.2 05/10/2021   MCV 88.3 05/10/2021   PLT 210 05/10/2021   Recent Labs    03/29/21 1352 04/19/21 0921 05/10/21 0934  NA 134* 135 133*  K 4.3 4.6 4.1  CL 96* 96* 97*  CO2 28 29 26   GLUCOSE 185* 138* 139*  BUN 17 23* 21*  CREATININE 0.99 1.00 1.05  CALCIUM 9.6 10.0 9.4  GFRNONAA >60 >60 >60  PROT 7.7 8.1 7.9  ALBUMIN 4.2 4.6 4.6  AST 18 22 24   ALT 18 22 22   ALKPHOS 87 93 87  BILITOT 0.2* 0.7 0.6    Iron/TIBC/Ferritin/ %  Sat    Component Value Date/Time   IRON 118  09/25/2019 1115   TIBC 454 (H) 09/25/2019 1115   FERRITIN 236 09/25/2019 1115   IRONPCTSAT 26 09/25/2019 1115      RADIOGRAPHIC STUDIES: I have personally reviewed the radiological images as listed and agreed with the findings in the report.  CT CHEST ABDOMEN PELVIS WO CONTRAST  Result Date: 04/18/2021 CLINICAL DATA:  Follow-up metastatic renal cell carcinoma. Undergoing immunotherapy. EXAM: CT CHEST, ABDOMEN AND PELVIS WITHOUT CONTRAST TECHNIQUE: Multidetector CT imaging of the chest, abdomen and pelvis was performed following the standard protocol without IV contrast. COMPARISON:  01/14/2021 FINDINGS: CT CHEST FINDINGS Cardiovascular: No acute findings. Aortic and coronary atherosclerotic calcification noted. Mediastinum/Lymph Nodes: No masses or pathologically enlarged lymph nodes identified on this unenhanced exam. Lungs/Pleura: A few tiny less than 5 mm pulmonary nodules are again seen in both upper lobes, which remains stable. No new or enlarging pulmonary nodules or masses identified. Mild pleural-parenchymal scarring is again seen bilaterally. No evidence of pulmonary infiltrate or pleural effusion. Musculoskeletal:  No suspicious bone lesions identified. CT ABDOMEN AND PELVIS FINDINGS Hepatobiliary: No masses visualized on this unenhanced exam. Gallbladder is unremarkable. No evidence of biliary ductal dilatation. Pancreas: No mass or inflammatory changes identified on this unenhanced exam. Spleen:  Within normal limits in size. Adrenals/Urinary Tract: Normal adrenal glands. Prior right nephrectomy. Small low and high attenuation lesions in the posterior interpolar region of the left kidney remains stable, and have previously been characterized as Bosniak category 1 and 2 cysts on prior CT on 06/21/2018. No new renal lesions are seen on this unenhanced exam. No evidence of urolithiasis or hydronephrosis. Unremarkable unopacified urinary bladder. Stomach/Bowel: No evidence of obstruction,  inflammatory process, or abnormal fluid collections. Normal appendix visualized. Vascular/Lymphatic: No pathologically enlarged lymph nodes identified. No abdominal aortic aneurysm. Aortic atherosclerotic calcification noted. Reproductive:  No masses or other significant abnormality. Other:  None. Musculoskeletal:  No suspicious bone lesions identified. IMPRESSION: Stable exam. No evidence of recurrent or metastatic carcinoma within the chest, abdomen, or pelvis. Electronically Signed   By: Marlaine Hind M.D.   On: 04/18/2021 09:03       ASSESSMENT & PLAN:  1. Encounter for antineoplastic immunotherapy   2. Clear cell carcinoma of right kidney ([place])   3. 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 Labs reviewed and discussed with patient. Proceed with Keytruda today.  #Tachycardia, etiology unknown.  This has been chronic condition for him since his surgery.  Was felt to be related to pain and deconditioning. Patient was given 1 L of IV normal saline and repeat heart rate has improved to 110.  Encourage oral hydration.  Monitor symptoms.  .  Recent history of knee replacement.  Follow-up with orthopedic surgeon.  He is on aspirin 81 mg currently for DVT prophylaxis.  Follow-up in 3 weeks All questions were answered. The patient knows to call the clinic with any problems questions or concerns.  Earlie Server, MD, PhD 05/10/2021

## 2021-05-10 NOTE — Progress Notes (Signed)
After I liter NS bolus patient HR is now 110. Per Dr. Tasia Catchings ok to proceed with [facility] today.

## 2021-05-31 ENCOUNTER — Inpatient Hospital Stay: Payer: BC Managed Care – PPO

## 2021-05-31 ENCOUNTER — Other Ambulatory Visit: Payer: Self-pay

## 2021-05-31 ENCOUNTER — Encounter: Payer: Self-pay | Admitting: Oncology

## 2021-05-31 ENCOUNTER — Inpatient Hospital Stay: Payer: BC Managed Care – PPO | Attending: Oncology

## 2021-05-31 ENCOUNTER — Inpatient Hospital Stay (HOSPITAL_BASED_OUTPATIENT_CLINIC_OR_DEPARTMENT_OTHER): Payer: BC Managed Care – PPO | Admitting: Oncology

## 2021-05-31 VITALS — BP 142/92 | HR 86 | Temp 97.2°F | Resp 18 | Wt 215.2 lb

## 2021-05-31 DIAGNOSIS — Z7982 Long term (current) use of aspirin: Secondary | ICD-10-CM | POA: Insufficient documentation

## 2021-05-31 DIAGNOSIS — Z905 Acquired absence of kidney: Secondary | ICD-10-CM | POA: Diagnosis not present

## 2021-05-31 DIAGNOSIS — C641 Malignant neoplasm of right kidney, except renal pelvis: Secondary | ICD-10-CM

## 2021-05-31 DIAGNOSIS — Z96652 Presence of left artificial knee joint: Secondary | ICD-10-CM

## 2021-05-31 DIAGNOSIS — Z5112 Encounter for antineoplastic immunotherapy: Secondary | ICD-10-CM | POA: Diagnosis present

## 2021-05-31 DIAGNOSIS — Z79631 Long term (current) use of antimetabolite agent: Secondary | ICD-10-CM | POA: Diagnosis not present

## 2021-05-31 DIAGNOSIS — Z87891 Personal history of nicotine dependence: Secondary | ICD-10-CM | POA: Insufficient documentation

## 2021-05-31 DIAGNOSIS — L408 Other psoriasis: Secondary | ICD-10-CM | POA: Insufficient documentation

## 2021-05-31 DIAGNOSIS — C7911 Secondary malignant neoplasm of bladder: Secondary | ICD-10-CM | POA: Diagnosis not present

## 2021-05-31 DIAGNOSIS — C7800 Secondary malignant neoplasm of unspecified lung: Secondary | ICD-10-CM | POA: Insufficient documentation

## 2021-05-31 DIAGNOSIS — Z96653 Presence of artificial knee joint, bilateral: Secondary | ICD-10-CM | POA: Diagnosis not present

## 2021-05-31 DIAGNOSIS — R21 Rash and other nonspecific skin eruption: Secondary | ICD-10-CM

## 2021-05-31 LAB — COMPREHENSIVE METABOLIC PANEL
ALT: 17 U/L (ref 0–44)
AST: 16 U/L (ref 15–41)
Albumin: 4.5 g/dL (ref 3.5–5.0)
Alkaline Phosphatase: 85 U/L (ref 38–126)
Anion gap: 8 (ref 5–15)
BUN: 22 mg/dL — ABNORMAL HIGH (ref 6–20)
CO2: 28 mmol/L (ref 22–32)
Calcium: 9.7 mg/dL (ref 8.9–10.3)
Chloride: 100 mmol/L (ref 98–111)
Creatinine, Ser: 1.09 mg/dL (ref 0.61–1.24)
GFR, Estimated: >60 mL/min (ref >60)
Glucose, Bld: 198 mg/dL — ABNORMAL HIGH (ref 70–99)
Potassium: 4.4 mmol/L (ref 3.5–5.1)
Sodium: 136 mmol/L (ref 135–145)
Total Bilirubin: 0.4 mg/dL (ref 0.3–1.2)
Total Protein: 7.6 g/dL (ref 6.5–8.1)

## 2021-05-31 LAB — CBC WITH DIFFERENTIAL/PLATELET
Abs Immature Granulocytes: 0.07 10*3/uL (ref 0.00–0.07)
Basophils Absolute: 0.1 10*3/uL (ref 0.0–0.1)
Basophils Relative: 1 %
Eosinophils Absolute: 0.6 10*3/uL — ABNORMAL HIGH (ref 0.0–0.5)
Eosinophils Relative: 8 %
HCT: 43.9 % (ref 39.0–52.0)
Hemoglobin: 14.5 g/dL (ref 13.0–17.0)
Immature Granulocytes: 1 %
Lymphocytes Relative: 27 %
Lymphs Abs: 2 10*3/uL (ref 0.7–4.0)
MCH: 29.1 pg (ref 26.0–34.0)
MCHC: 33 g/dL (ref 30.0–36.0)
MCV: 88 fL (ref 80.0–100.0)
Monocytes Absolute: 0.6 10*3/uL (ref 0.1–1.0)
Monocytes Relative: 8 %
Neutro Abs: 4.2 10*3/uL (ref 1.7–7.7)
Neutrophils Relative %: 55 %
Platelets: 239 10*3/uL (ref 150–400)
RBC: 4.99 MIL/uL (ref 4.22–5.81)
RDW: 13.7 % (ref 11.5–15.5)
WBC: 7.5 10*3/uL (ref 4.0–10.5)
nRBC: 0 % (ref 0.0–0.2)

## 2021-05-31 MED ORDER — SODIUM CHLORIDE 0.9 % IV SOLN
200.0000 mg | Freq: Once | INTRAVENOUS | Status: AC
  Administered 2021-05-31: 200 mg via INTRAVENOUS
  Filled 2021-05-31: qty 200

## 2021-05-31 MED ORDER — SODIUM CHLORIDE 0.9 % IV SOLN
Freq: Once | INTRAVENOUS | Status: AC
  Filled 2021-05-31: qty 250

## 2021-05-31 NOTE — Patient Instructions (Signed)
[facility]  Discharge Instructions: °Thank you for choosing [facility] to provide your oncology and hematology care.  °If you have a lab appointment with the Cancer Center, please go directly to the Cancer Center and check in at the registration area. ° °Wear comfortable clothing and clothing appropriate for easy access to any Portacath or PICC line.  ° °We strive to give you quality time with your provider. You may need to reschedule your appointment if you arrive late (15 or more minutes).  Arriving late affects you and other patients whose appointments are after yours.  Also, if you miss three or more appointments without notifying the office, you may be dismissed from the clinic at the provider’s discretion.    °  °For prescription refill requests, have your pharmacy contact our office and allow 72 hours for refills to be completed.   ° °Today you received the following chemotherapy and/or immunotherapy agents - pembrolizumab    °  °To help prevent nausea and vomiting after your treatment, we encourage you to take your nausea medication as directed. ° °BELOW ARE SYMPTOMS THAT SHOULD BE REPORTED IMMEDIATELY: °*FEVER GREATER THAN 100.4 F (38 °C) OR HIGHER °*CHILLS OR SWEATING °*NAUSEA AND VOMITING THAT IS NOT CONTROLLED WITH YOUR NAUSEA MEDICATION °*UNUSUAL SHORTNESS OF BREATH °*UNUSUAL BRUISING OR BLEEDING °*URINARY PROBLEMS (pain or burning when urinating, or frequent urination) °*BOWEL PROBLEMS (unusual diarrhea, constipation, pain near the anus) °TENDERNESS IN MOUTH AND THROAT WITH OR WITHOUT PRESENCE OF ULCERS (sore throat, sores in mouth, or a toothache) °UNUSUAL RASH, SWELLING OR PAIN  °UNUSUAL VAGINAL DISCHARGE OR ITCHING  ° °Items with * indicate a potential emergency and should be followed up as soon as possible or go to the Emergency Department if any problems should occur. ° °Please show the CHEMOTHERAPY ALERT CARD or IMMUNOTHERAPY ALERT CARD at  check-in to the Emergency Department and triage nurse. ° °Should you have questions after your visit or need to cancel or reschedule your appointment, please contact [facility]  [phone] and follow the prompts.  Office hours are 8:00 a.m. to 4:30 p.m. Monday - Friday. Please note that voicemails left after 4:00 p.m. may not be returned until the following business day.  We are closed weekends and major holidays. You have access to a nurse at all times for urgent questions. Please call the main number to the clinic [phone] and follow the prompts. ° °For any non-urgent questions, you may also contact your provider using MyChart. We now offer e-Visits for anyone 18 and older to request care online for non-urgent symptoms. For details visit [email or web address]. °  °Also download the MyChart app! Go to the app store, search "MyChart", open the app, select [facility], and log in with your MyChart username and password. ° °Due to Covid, a mask is required upon entering the hospital/clinic. If you do not have a mask, one will be given to you upon arrival. For doctor visits, patients may have 1 support person aged 18 or older with them. For treatment visits, patients cannot have anyone with them due to current Covid guidelines and our immunocompromised population.  ° °Pembrolizumab injection °What is this medication? °PEMBROLIZUMAB (pem broe liz ue mab) is a monoclonal antibody. It is used to treat certain types of cancer. °This medicine may be used for other purposes; ask your health care provider or pharmacist if you have questions. °COMMON BRAND NAME(S): Keytruda °What should I tell my care   team before I take this medication? °They need to know if you have any of these conditions: °autoimmune diseases like Crohn's disease, ulcerative colitis, or lupus °have had or planning to have an allogeneic stem cell transplant (uses someone else's stem cells) °history of organ  transplant °history of chest radiation °nervous system problems like myasthenia gravis or Guillain-Barre syndrome °an unusual or allergic reaction to pembrolizumab, other medicines, foods, dyes, or preservatives °pregnant or trying to get pregnant °breast-feeding °How should I use this medication? °This medicine is for infusion into a vein. It is given by a health care professional in a hospital or clinic setting. °A special MedGuide will be given to you before each treatment. Be sure to read this information carefully each time. °Talk to your pediatrician regarding the use of this medicine in children. While this drug may be prescribed for children as young as 6 months for selected conditions, precautions do apply. °Overdosage: If you think you have taken too much of this medicine contact a poison control center or emergency room at once. °NOTE: This medicine is only for you. Do not share this medicine with others. °What if I miss a dose? °It is important not to miss your dose. Call your doctor or health care professional if you are unable to keep an appointment. °What may interact with this medication? °Interactions have not been studied. °This list may not describe all possible interactions. Give your health care provider a list of all the medicines, herbs, non-prescription drugs, or dietary supplements you use. Also tell them if you smoke, drink alcohol, or use illegal drugs. Some items may interact with your medicine. °What should I watch for while using this medication? °Your condition will be monitored carefully while you are receiving this medicine. °You may need blood work done while you are taking this medicine. °Do not become pregnant while taking this medicine or for 4 months after stopping it. Women should inform their doctor if they wish to become pregnant or think they might be pregnant. There is a potential for serious side effects to an unborn child. Talk to your health care professional or  pharmacist for more information. Do not breast-feed an infant while taking this medicine or for 4 months after the last dose. °What side effects may I notice from receiving this medication? °Side effects that you should report to your doctor or health care professional as soon as possible: °allergic reactions like skin rash, itching or hives, swelling of the face, lips, or tongue °bloody or black, tarry °breathing problems °changes in vision °chest pain °chills °confusion °constipation °cough °diarrhea °dizziness or feeling faint or lightheaded °fast or irregular heartbeat °fever °flushing °joint pain °low blood counts - this medicine may decrease the number of white blood cells, red blood cells and platelets. You may be at increased risk for infections and bleeding. °muscle pain °muscle weakness °pain, tingling, numbness in the hands or feet °persistent headache °redness, blistering, peeling or loosening of the skin, including inside the mouth °signs and symptoms of high blood sugar such as dizziness; dry mouth; dry skin; fruity breath; nausea; stomach pain; increased hunger or thirst; increased urination °signs and symptoms of kidney injury like trouble passing urine or change in the amount of urine °signs and symptoms of liver injury like dark urine, light-colored stools, loss of appetite, nausea, right upper belly pain, yellowing of the eyes or skin °sweating °swollen lymph nodes °weight loss °Side effects that usually do not require medical attention (report to your doctor   or health care professional if they continue or are bothersome): °decreased appetite °hair loss °tiredness °This list may not describe all possible side effects. Call your doctor for medical advice about side effects. You may report side effects to FDA at 1-800-FDA-1088. °Where should I keep my medication? °This drug is given in a hospital or clinic and will not be stored at home. °NOTE: This sheet is a summary. It may not cover all possible  information. If you have questions about this medicine, talk to your doctor, pharmacist, or health care provider. °© 2022 [employer] (2021-01-18 00:00:00) ° °

## 2021-05-31 NOTE — Progress Notes (Signed)
Patient here for follow up. No new concerns voiced.  °

## 2021-05-31 NOTE — Progress Notes (Signed)
Hematology/Oncology follow up note [phone] [phone]   Patient Care Team: Danae Orleans, MD as PCP - General (Internal Medicine) [name], [name], MD as PCP - Family Medicine (Family Medicine) [name], MD as Consulting Physician (General Surgery) [name], [name], MD as Attending Physician (Family Medicine) [name], [name], MD as Consulting Physician (Dermatology)  REFERRING PROVIDER: Danae Orleans, MD  CHIEF COMPLAINTS/REASON FOR VISIT:  Follow-up of kidney cancer  HISTORY OF PRESENTING ILLNESS:   Matthew Woodard is a  56 y.o.  male with PMH listed below was seen in consultation at the request of  Danae Orleans, MD  for evaluation of kidney cancer Patient's cancer history dated back to February 2020 when he developed gross hematuria with small clots and right-sided flank pain.  Patient had a CT urogram done which showed right 4 cm central enhancing renal mass with invasion into the collecting system.  X-ray of chest was performed to see complete staging which showed no concerning findings. 06/28/2018 patient underwent a cystoscopy, bladder biopsy and a right retrograde pyelogram with intraoperative interpretation, right diagnostic ureteroscopy, right renal pelvis biopsy, right ureteral stent placement.  biopsy showed atypical cell clusters, with extensive crush artifact, nondiagnostic.   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  Surgical margins negative for tumor.pT3a,Nx Patient has been on surveillance after surgery. 10/28/2018 CT abdomen pelvis with contrast showed minimal fluid and or postoperative changes within the right renal fossa.  No evidence of metastatic disease or recurrence.  Patient has left kidney lesion previously characterized as nonenhancing cyst by  multi phasic contrast-enhanced CT. Patient reports an episode of gross hematuria in early December 2020, patient is due for 71-month surveillance CT scan. 04/30/2019 CT abdomen pelvis with contrast showed high attenuation mass at the right uretero vesicle junction, with extension into the bladder.  Bilateral pulmonary nodules, most indicated for metastatic disease.   Patient underwent cystoscopy and transurethral resection of irregular bladder tumor 3 cm.  Mass appeared to emanate from the right ureteral orifice.  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 Patient was referred to me for further work-up and management.  #Staging chest CT with contrast showed numerous bilateral pulmonary nodules.  Compatible with metastatic disease. Bone scan showed no osseous metastatic disease.   #History of tobacco abuse, former smoker.  Quit in 2015.  He denies any hemoptysis, chest pain, shortness of breath today. Patient reports that hematuria has completely resolved.  Denies any pain today. He is accompanied by his wife.  #History of cutaneous psoriasis, mostly on his right hand, currently on weekly methotrexate 12.5 mg with good symptom control. #NGS: [facility] PD L1 TPS 1%  # patient was referred to ENT for evaluation of headache and sinus pressure.  He had a ENT evaluation and feels that patient does not have sinusitis.  [name] recommended MRI brain to rule out PRES syndrome.  Axitinib has been held MRI brain was done and was negative.   #09/2019 off axitinib and Keytruda since the beginning of May due to transaminitis. Patient was seen and evaluated by gastroenterology [name]. Work-up was negative. Ultrasound liver vascular Doppler is negative. Recommend observation. #11/24/2019, resumed on Keytruda every 3 weeks #01/05/2020, axitinib was resumed 3 mg #01/20/2020, axitinib was discontinued due to recurrence of transaminitis  Beryle Flock was temporarily  held. #03/16/2020, resumed on Keytruda  every 3 weeks. 09/29/2020 CT scan showed no evidence of local recurrence or new/progressive disease in the chest abdomen or pelvis.  Tiny nonspecific lung nodules are unchanged.  No new lung nodules. May 2022, left knee replacement 01/14/2021 CT chest abdomen pelvis showed stable exam, no new or progressive metastatic disease within chest abdomen pelvis. Stable lung nodules. Aortic atherosclerosis.  Sept 2022 right knee replacement 04/15/2021, CT chest abdomen pelvis with contrast showed stable examination.  No evidence of recurrent or metastatic carcinoma within the chest abdomen pelvis. INTERVAL HISTORY Matthew Woodard is a 56 y.o. male who has above history reviewed by me today presents for follow up visit for management of metastatic RCC. Patient has no new complaints. Denies any shortness of breath, nausea vomiting diarrhea.   Review of Systems  Constitutional:  Negative for appetite change, chills, fatigue, fever and unexpected weight change.  HENT:   Negative for hearing loss and voice change.   Eyes:  Negative for eye problems and icterus.  Respiratory:  Negative for chest tightness, cough and shortness of breath.   Cardiovascular:  Negative for chest pain and leg swelling.  Gastrointestinal:  Negative for abdominal distention, abdominal pain, blood in stool and nausea.  Endocrine: Negative for hot flashes.  Genitourinary:  Negative for difficulty urinating, dysuria and frequency.   Musculoskeletal:  Positive for arthralgias.       Right knee replacement  Skin:  Negative for itching and rash.  Neurological:  Negative for extremity weakness, headaches, light-headedness and numbness.  Hematological:  Negative for adenopathy. Does not bruise/bleed easily.  Psychiatric/Behavioral:  Negative for confusion.    MEDICAL HISTORY:  Past Medical History:  Diagnosis Date   Arthritis    hands, left knee   Cancer ([place])    Diabetes mellitus without  complication ([place])    Eczema 01/08/2017   GERD (gastroesophageal reflux disease)    History of kidney cancer    Hyperlipidemia LDL goal <100 11/02/2014   Hypertension    Kidney stone    YEAR H/O HEMATURIA   Psoriasis    Wears dentures    full upper, partial lower.  Has, does not wear    SURGICAL HISTORY: Past Surgical History:  Procedure Laterality Date   COLONOSCOPY WITH PROPOFOL N/A 02/23/2017   Procedure: COLONOSCOPY WITH PROPOFOL;  Surgeon: [name], MD;  Location: [place];  Service: Endoscopy;  Laterality: N/A;  Diabetic - oral meds   CYST EXCISION  Aug. 2016   on back   CYSTOSCOPY W/ RETROGRADES Right 06/28/2018   Procedure: CYSTOSCOPY WITH RETROGRADE PYELOGRAM;  Surgeon: Billey Co, MD;  Location: [facility];  Service: Urology;  Laterality: Right;   CYSTOSCOPY W/ URETERAL STENT REMOVAL Right 07/22/2018   Procedure: CYSTOSCOPY WITH STENT REMOVAL;  Surgeon: Billey Co, MD;  Location: [facility];  Service: Urology;  Laterality: Right;   CYSTOSCOPY WITH BIOPSY Right 06/28/2018   Procedure: CYSTOSCOPY WITH RENAL BIOPSY;  Surgeon: Billey Co, MD;  Location: [facility];  Service: Urology;  Laterality: Right;   CYSTOSCOPY WITH URETEROSCOPY AND STENT PLACEMENT Right 06/28/2018   Procedure: CYSTOSCOPY WITH URETEROSCOPY AND STENT PLACEMENT;  Surgeon: Billey Co, MD;  Location: [facility];  Service: Urology;  Laterality: Right;   FULGURATION OF BLADDER TUMOR N/A 06/28/2018   Procedure: CYSTOSCOPY BLADDER BIOPSY, FULGERATION OF BLADDER;  Surgeon: Billey Co, MD;  Location: [facility];  Service: Urology;  Laterality: N/A;   KNEE SURGERY Left    LAPAROSCOPIC NEPHRECTOMY, HAND ASSISTED Right  07/22/2018   Procedure: HAND ASSISTED LAPAROSCOPIC NEPHRECTOMY;  Surgeon: Billey Co, MD;  Location: [facility];  Service: Urology;  Laterality: Right;   NASAL SINUS SURGERY  03/15   POLYPECTOMY N/A 02/23/2017   Procedure: POLYPECTOMY;  Surgeon: [name], MD;  Location:  [place];  Service: Endoscopy;  Laterality: N/A;   tooth abstraction     TRANSURETHRAL RESECTION OF BLADDER TUMOR N/A 05/12/2019   Procedure: TRANSURETHRAL RESECTION OF BLADDER TUMOR (TURBT);  Surgeon: Billey Co, MD;  Location: [facility];  Service: Urology;  Laterality: N/A;   VARICOCELE EXCISION     XI ROBOTIC ASSISTED VENTRAL HERNIA N/A 11/18/2020   Procedure: XI ROBOTIC ASSISTED VENTRAL HERNIA, incisional;  Surgeon: Jules Husbands, MD;  Location: [facility];  Service: General;  Laterality: N/A;    SOCIAL HISTORY: Social History   Socioeconomic History   Marital status: Married    Spouse name: Alma Friendly   Number of children: Not on file   Years of education: Not on file   Highest education level: Not on file  Occupational History   Not on file  Tobacco Use   Smoking status: Former    Types: Cigarettes    Quit date: 06/15/2013    Years since quitting: 7.9   Smokeless tobacco: Never  Vaping Use   Vaping Use: Never used  Substance and Sexual Activity   Alcohol use: No    Alcohol/week: 0.0 standard drinks   Drug use: No   Sexual activity: Yes  Other Topics Concern   Not on file  Social History Narrative   Lives with wife, sister in law   Social Determinants of Health   Financial Resource Strain: Not on file  Food Insecurity: Not on file  Transportation Needs: Not on file  Physical Activity: Not on file  Stress: Not on file  Social Connections: Not on file  Intimate Partner Violence: Not on file    FAMILY HISTORY: Family History  Problem Relation Age of Onset   Heart disease Father    Hypertension Father    COPD Father    Colon cancer Maternal Grandmother    Heart attack Paternal Grandfather     ALLERGIES:  is allergic to glucotrol [glipizide].  MEDICATIONS:  Current Outpatient Medications  Medication Sig Dispense Refill   aspirin 81 MG EC tablet Take 81 mg by mouth daily with supper.     clobetasol ointment (TEMOVATE) 5.28 % Apply 1 application  topically 2 (two) times daily as needed for rash.     cyclobenzaprine (FLEXERIL) 10 MG tablet Take 10 mg by mouth 3 (three) times daily as needed for muscle spasms.     Glucose Blood (ACCU-CHEK AVIVA PLUS VI) by In Vitro route.     JARDIANCE 10 MG TABS tablet TAKE 1 TABLET BY MOUTH DAILY 30 tablet 2   lisinopril (PRINIVIL,ZESTRIL) 10 MG tablet TAKE 1 TABLET(10 MG) BY MOUTH DAILY (Patient taking differently: Take 10 mg by mouth daily with supper.) 90 tablet 1   metFORMIN (GLUCOPHAGE-XR) 500 MG 24 hr tablet Take 3 pills by mouth daily for 4-5 days, then increase to 4 pills daily (if tolerated) (Patient taking differently: Take 1,000 mg by mouth daily with supper.) 360 tablet 0   Multiple Vitamin (MULTIVITAMIN) tablet Take 1 tablet by mouth daily with supper.     omeprazole (PRILOSEC) 20 MG capsule Take 20 mg by mouth daily as needed ([place]).     OZEMPIC, 0.25 OR 0.5 MG/DOSE, 2 MG/1.5ML SOPN Inject 1 mg  into the skin every Sunday.     prochlorperazine (COMPAZINE) 10 MG tablet Take 1 tablet (10 mg total) by mouth every 6 (six) hours as needed for nausea or vomiting. 15 tablet 0   rosuvastatin (CRESTOR) 20 MG tablet Take 20 mg by mouth daily with supper.     traZODone (DESYREL) 50 MG tablet TAKE 1 TABLET(50 MG) BY MOUTH AT BEDTIME 30 tablet 3   HYDROcodone-acetaminophen (NORCO/VICODIN) 5-325 MG tablet Take 1-2 tablets by mouth every 4 (four) hours as needed for moderate pain. (Patient not taking: Reported on 05/31/2021) 20 tablet 0   Oxycodone HCl 10 MG TABS Take 10 mg by mouth every 4 (four) hours. (Patient not taking: Reported on 05/31/2021)     No current facility-administered medications for this visit.     PHYSICAL EXAMINATION: ECOG PERFORMANCE STATUS: 1 - Symptomatic but completely ambulatory Vitals:   05/31/21 0918  BP: (!) 142/92  Pulse: 86  Resp: 18  Temp: (!) 97.2 F (36.2 C)   Filed Weights   05/31/21 0918  Weight: 215 lb 3.2 oz (97.6 kg)    Physical Exam Constitutional:       General: He is not in acute distress.    Comments: Patient walks with a walker  HENT:     Head: Normocephalic and atraumatic.  Eyes:     General: No scleral icterus.    Pupils: Pupils are equal, round, and reactive to light.  Cardiovascular:     Rate and Rhythm: Regular rhythm. Tachycardia present.     Heart sounds: Normal heart sounds.  Pulmonary:     Effort: Pulmonary effort is normal. No respiratory distress.     Breath sounds: No wheezing.  Abdominal:     General: Bowel sounds are normal. There is no distension.     Palpations: Abdomen is soft. There is no mass.     Tenderness: There is no abdominal tenderness.  Musculoskeletal:        General: No deformity. Normal range of motion.     Cervical back: Normal range of motion and neck supple.     Comments: Right knee status post knee replacement.   Skin:    General: Skin is warm and dry.     Findings: No erythema or rash.  Neurological:     Mental Status: He is alert and oriented to person, place, and time. Mental status is at baseline.     Cranial Nerves: No cranial nerve deficit.     Coordination: Coordination normal.  Psychiatric:        Mood and Affect: Mood normal.    LABORATORY DATA:  I have reviewed the data as listed Lab Results  Component Value Date   WBC 7.5 05/31/2021   HGB 14.5 05/31/2021   HCT 43.9 05/31/2021   MCV 88.0 05/31/2021   PLT 239 05/31/2021   Recent Labs    04/19/21 0921 05/10/21 0934 05/31/21 0831  NA 135 133* 136  K 4.6 4.1 4.4  CL 96* 97* 100  CO2 29 26 28   GLUCOSE 138* 139* 198*  BUN 23* 21* 22*  CREATININE 1.00 1.05 1.09  CALCIUM 10.0 9.4 9.7  GFRNONAA >60 >60 >60  PROT 8.1 7.9 7.6  ALBUMIN 4.6 4.6 4.5  AST 22 24 16   ALT 22 22 17   ALKPHOS 93 87 85  BILITOT 0.7 0.6 0.4    Iron/TIBC/Ferritin/ %Sat    Component Value Date/Time   IRON 118 09/25/2019 1115   TIBC 454 (H) 09/25/2019 1115  FERRITIN 236 09/25/2019 1115   IRONPCTSAT 26 09/25/2019 1115      RADIOGRAPHIC  STUDIES: I have personally reviewed the radiological images as listed and agreed with the findings in the report.  CT CHEST ABDOMEN PELVIS WO CONTRAST  Result Date: 04/18/2021 CLINICAL DATA:  Follow-up metastatic renal cell carcinoma. Undergoing immunotherapy. EXAM: CT CHEST, ABDOMEN AND PELVIS WITHOUT CONTRAST TECHNIQUE: Multidetector CT imaging of the chest, abdomen and pelvis was performed following the standard protocol without IV contrast. COMPARISON:  01/14/2021 FINDINGS: CT CHEST FINDINGS Cardiovascular: No acute findings. Aortic and coronary atherosclerotic calcification noted. Mediastinum/Lymph Nodes: No masses or pathologically enlarged lymph nodes identified on this unenhanced exam. Lungs/Pleura: A few tiny less than 5 mm pulmonary nodules are again seen in both upper lobes, which remains stable. No new or enlarging pulmonary nodules or masses identified. Mild pleural-parenchymal scarring is again seen bilaterally. No evidence of pulmonary infiltrate or pleural effusion. Musculoskeletal:  No suspicious bone lesions identified. CT ABDOMEN AND PELVIS FINDINGS Hepatobiliary: No masses visualized on this unenhanced exam. Gallbladder is unremarkable. No evidence of biliary ductal dilatation. Pancreas: No mass or inflammatory changes identified on this unenhanced exam. Spleen:  Within normal limits in size. Adrenals/Urinary Tract: Normal adrenal glands. Prior right nephrectomy. Small low and high attenuation lesions in the posterior interpolar region of the left kidney remains stable, and have previously been characterized as Bosniak category 1 and 2 cysts on prior CT on 06/21/2018. No new renal lesions are seen on this unenhanced exam. No evidence of urolithiasis or hydronephrosis. Unremarkable unopacified urinary bladder. Stomach/Bowel: No evidence of obstruction, inflammatory process, or abnormal fluid collections. Normal appendix visualized. Vascular/Lymphatic: No pathologically enlarged lymph nodes  identified. No abdominal aortic aneurysm. Aortic atherosclerotic calcification noted. Reproductive:  No masses or other significant abnormality. Other:  None. Musculoskeletal:  No suspicious bone lesions identified. IMPRESSION: Stable exam. No evidence of recurrent or metastatic carcinoma within the chest, abdomen, or pelvis. Electronically Signed   By: Marlaine Hind M.D.   On: 04/18/2021 09:03       ASSESSMENT & PLAN:  1. Clear cell carcinoma of right kidney ([place])   2. Encounter for antineoplastic immunotherapy   3. 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 Labs are reviewed and discussed with patient Proceed with Keytruda today.  #Tachycardia, has improved and resolved.  Today he has a heart rate of 86.  Encourage oral hydration.  Recent history of knee replacement.  Follow-up with orthopedic surgeon.  He is on aspirin 81 mg currently for DVT prophylaxis.  Follow-up in 3 weeks All questions were answered. The patient knows to call the clinic with any problems questions or concerns.  Earlie Server, MD, PhD 05/31/2021

## 2021-06-21 ENCOUNTER — Inpatient Hospital Stay (HOSPITAL_BASED_OUTPATIENT_CLINIC_OR_DEPARTMENT_OTHER): Payer: BC Managed Care – PPO | Admitting: Oncology

## 2021-06-21 ENCOUNTER — Other Ambulatory Visit: Payer: Self-pay

## 2021-06-21 ENCOUNTER — Inpatient Hospital Stay: Payer: BC Managed Care – PPO | Attending: Oncology

## 2021-06-21 ENCOUNTER — Encounter: Payer: Self-pay | Admitting: Oncology

## 2021-06-21 ENCOUNTER — Inpatient Hospital Stay: Payer: BC Managed Care – PPO

## 2021-06-21 VITALS — BP 153/85 | HR 82 | Temp 96.2°F | Resp 16 | Wt 218.0 lb

## 2021-06-21 DIAGNOSIS — Z905 Acquired absence of kidney: Secondary | ICD-10-CM | POA: Diagnosis not present

## 2021-06-21 DIAGNOSIS — K219 Gastro-esophageal reflux disease without esophagitis: Secondary | ICD-10-CM | POA: Insufficient documentation

## 2021-06-21 DIAGNOSIS — E785 Hyperlipidemia, unspecified: Secondary | ICD-10-CM | POA: Insufficient documentation

## 2021-06-21 DIAGNOSIS — E119 Type 2 diabetes mellitus without complications: Secondary | ICD-10-CM | POA: Insufficient documentation

## 2021-06-21 DIAGNOSIS — C7800 Secondary malignant neoplasm of unspecified lung: Secondary | ICD-10-CM | POA: Insufficient documentation

## 2021-06-21 DIAGNOSIS — Z96653 Presence of artificial knee joint, bilateral: Secondary | ICD-10-CM | POA: Insufficient documentation

## 2021-06-21 DIAGNOSIS — Z5112 Encounter for antineoplastic immunotherapy: Secondary | ICD-10-CM

## 2021-06-21 DIAGNOSIS — C641 Malignant neoplasm of right kidney, except renal pelvis: Secondary | ICD-10-CM | POA: Insufficient documentation

## 2021-06-21 DIAGNOSIS — Z7984 Long term (current) use of oral hypoglycemic drugs: Secondary | ICD-10-CM | POA: Insufficient documentation

## 2021-06-21 DIAGNOSIS — Z79899 Other long term (current) drug therapy: Secondary | ICD-10-CM | POA: Insufficient documentation

## 2021-06-21 DIAGNOSIS — R21 Rash and other nonspecific skin eruption: Secondary | ICD-10-CM

## 2021-06-21 DIAGNOSIS — Z87891 Personal history of nicotine dependence: Secondary | ICD-10-CM | POA: Diagnosis not present

## 2021-06-21 DIAGNOSIS — I700 Atherosclerosis of aorta: Secondary | ICD-10-CM | POA: Diagnosis not present

## 2021-06-21 DIAGNOSIS — R7401 Elevation of levels of liver transaminase levels: Secondary | ICD-10-CM | POA: Diagnosis not present

## 2021-06-21 DIAGNOSIS — Z7189 Other specified counseling: Secondary | ICD-10-CM

## 2021-06-21 DIAGNOSIS — L409 Psoriasis, unspecified: Secondary | ICD-10-CM | POA: Diagnosis not present

## 2021-06-21 DIAGNOSIS — I10 Essential (primary) hypertension: Secondary | ICD-10-CM | POA: Diagnosis not present

## 2021-06-21 LAB — CBC WITH DIFFERENTIAL/PLATELET
Abs Immature Granulocytes: 0.05 10*3/uL (ref 0.00–0.07)
Basophils Absolute: 0 10*3/uL (ref 0.0–0.1)
Basophils Relative: 1 %
Eosinophils Absolute: 0.4 10*3/uL (ref 0.0–0.5)
Eosinophils Relative: 5 %
HCT: 45.9 % (ref 39.0–52.0)
Hemoglobin: 15.1 g/dL (ref 13.0–17.0)
Immature Granulocytes: 1 %
Lymphocytes Relative: 25 %
Lymphs Abs: 1.8 10*3/uL (ref 0.7–4.0)
MCH: 29.1 pg (ref 26.0–34.0)
MCHC: 32.9 g/dL (ref 30.0–36.0)
MCV: 88.4 fL (ref 80.0–100.0)
Monocytes Absolute: 0.6 10*3/uL (ref 0.1–1.0)
Monocytes Relative: 8 %
Neutro Abs: 4.4 10*3/uL (ref 1.7–7.7)
Neutrophils Relative %: 60 %
Platelets: 241 10*3/uL (ref 150–400)
RBC: 5.19 MIL/uL (ref 4.22–5.81)
RDW: 13.4 % (ref 11.5–15.5)
WBC: 7.3 10*3/uL (ref 4.0–10.5)
nRBC: 0 % (ref 0.0–0.2)

## 2021-06-21 LAB — COMPREHENSIVE METABOLIC PANEL
ALT: 22 U/L (ref 0–44)
AST: 23 U/L (ref 15–41)
Albumin: 4.6 g/dL (ref 3.5–5.0)
Alkaline Phosphatase: 73 U/L (ref 38–126)
Anion gap: 6 (ref 5–15)
BUN: 19 mg/dL (ref 6–20)
CO2: 27 mmol/L (ref 22–32)
Calcium: 9.3 mg/dL (ref 8.9–10.3)
Chloride: 101 mmol/L (ref 98–111)
Creatinine, Ser: 1.01 mg/dL (ref 0.61–1.24)
GFR, Estimated: >60 mL/min (ref >60)
Glucose, Bld: 150 mg/dL — ABNORMAL HIGH (ref 70–99)
Potassium: 4.4 mmol/L (ref 3.5–5.1)
Sodium: 134 mmol/L — ABNORMAL LOW (ref 135–145)
Total Bilirubin: 0.4 mg/dL (ref 0.3–1.2)
Total Protein: 7.7 g/dL (ref 6.5–8.1)

## 2021-06-21 MED ORDER — SODIUM CHLORIDE 0.9 % IV SOLN
200.0000 mg | Freq: Once | INTRAVENOUS | Status: AC
  Administered 2021-06-21: 200 mg via INTRAVENOUS
  Filled 2021-06-21: qty 200

## 2021-06-21 MED ORDER — SODIUM CHLORIDE 0.9 % IV SOLN
Freq: Once | INTRAVENOUS | Status: AC
  Filled 2021-06-21: qty 250

## 2021-06-21 NOTE — Progress Notes (Signed)
Pt in for follow up, denies any concerns today. 

## 2021-06-21 NOTE — Progress Notes (Signed)
Hematology/Oncology Progress note [phone] [phone]      Patient Care Team: Danae Orleans, MD as PCP - General (Internal Medicine) [name], [name], MD as PCP - Family Medicine (Family Medicine) [name], MD as Consulting Physician (General Surgery) [name], [name], MD as Attending Physician (Family Medicine) [name], [name], MD as Consulting Physician (Dermatology)  REFERRING PROVIDER: Danae Orleans, MD  CHIEF COMPLAINTS/REASON FOR VISIT:  Follow-up of kidney cancer  HISTORY OF PRESENTING ILLNESS:   Matthew Woodard is a  56 y.o.  male with PMH listed below was seen in consultation at the request of  Danae Orleans, MD  for evaluation of kidney cancer Patient's cancer history dated back to February 2020 when he developed gross hematuria with small clots and right-sided flank pain.  Patient had a CT urogram done which showed right 4 cm central enhancing renal mass with invasion into the collecting system.  X-ray of chest was performed to see complete staging which showed no concerning findings. 06/28/2018 patient underwent a cystoscopy, bladder biopsy and a right retrograde pyelogram with intraoperative interpretation, right diagnostic ureteroscopy, right renal pelvis biopsy, right ureteral stent placement.  biopsy showed atypical cell clusters, with extensive crush artifact, nondiagnostic.   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  Surgical margins negative for tumor.pT3a,Nx Patient has been on surveillance after surgery. 10/28/2018 CT abdomen pelvis with contrast showed minimal fluid and or postoperative changes within the right renal fossa.  No evidence of metastatic disease or recurrence.  Patient has left kidney lesion previously characterized as nonenhancing  cyst by multi phasic contrast-enhanced CT. Patient reports an episode of gross hematuria in early December 2020, patient is due for 2-month surveillance CT scan. 04/30/2019 CT abdomen pelvis with contrast showed high attenuation mass at the right uretero vesicle junction, with extension into the bladder.  Bilateral pulmonary nodules, most indicated for metastatic disease.   Patient underwent cystoscopy and transurethral resection of irregular bladder tumor 3 cm.  Mass appeared to emanate from the right ureteral orifice.  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 Patient was referred to me for further work-up and management.  #Staging chest CT with contrast showed numerous bilateral pulmonary nodules.  Compatible with metastatic disease. Bone scan showed no osseous metastatic disease.   #History of tobacco abuse, former smoker.  Quit in 2015.  He denies any hemoptysis, chest pain, shortness of breath today. Patient reports that hematuria has completely resolved.  Denies any pain today. He is accompanied by his wife.  #History of cutaneous psoriasis, mostly on his right hand, currently on weekly methotrexate 12.5 mg with good symptom control. #NGS: [facility] PD L1 TPS 1%  # patient was referred to ENT for evaluation of headache and sinus pressure.  He had a ENT evaluation and feels that patient does not have sinusitis.  [name] recommended MRI brain to rule out PRES syndrome.  Axitinib has been held MRI brain was done and was negative.   #09/2019 off axitinib and Keytruda since the beginning of May due to transaminitis. Patient was seen and evaluated by gastroenterology [name]. Work-up was negative. Ultrasound liver vascular Doppler is negative. Recommend observation. #11/24/2019, resumed on Keytruda every 3 weeks #01/05/2020, axitinib was resumed 3 mg #01/20/2020, axitinib was discontinued due to recurrence of transaminitis  Beryle Flock was  temporarily held. #03/16/2020, resumed  on Keytruda every 3 weeks. 09/29/2020 CT scan showed no evidence of local recurrence or new/progressive disease in the chest abdomen or pelvis.  Tiny nonspecific lung nodules are unchanged.  No new lung nodules. May 2022, left knee replacement 01/14/2021 CT chest abdomen pelvis showed stable exam, no new or progressive metastatic disease within chest abdomen pelvis. Stable lung nodules. Aortic atherosclerosis.  Sept 2022 right knee replacement 04/15/2021, CT chest abdomen pelvis with contrast showed stable examination.  No evidence of recurrent or metastatic carcinoma within the chest abdomen pelvis. INTERVAL HISTORY Matthew Woodard is a 56 y.o. male who has above history reviewed by me today presents for follow up visit for management of metastatic RCC. Patient has no new complaints. Denies any shortness of breath, nausea vomiting diarrhea.   Review of Systems  Constitutional:  Negative for appetite change, chills, fatigue, fever and unexpected weight change.  HENT:   Negative for hearing loss and voice change.   Eyes:  Negative for eye problems and icterus.  Respiratory:  Negative for chest tightness, cough and shortness of breath.   Cardiovascular:  Negative for chest pain and leg swelling.  Gastrointestinal:  Negative for abdominal distention, abdominal pain, blood in stool and nausea.  Endocrine: Negative for hot flashes.  Genitourinary:  Negative for difficulty urinating, dysuria and frequency.   Musculoskeletal:  Positive for arthralgias.       Right knee replacement  Skin:  Negative for itching and rash.  Neurological:  Negative for extremity weakness, headaches, light-headedness and numbness.  Hematological:  Negative for adenopathy. Does not bruise/bleed easily.  Psychiatric/Behavioral:  Negative for confusion.    MEDICAL HISTORY:  Past Medical History:  Diagnosis Date   Arthritis    hands, left knee   Cancer ([employer])    Diabetes mellitus  without complication ([place])    Eczema 01/08/2017   GERD (gastroesophageal reflux disease)    History of kidney cancer    Hyperlipidemia LDL goal <100 11/02/2014   Hypertension    Kidney stone    YEAR H/O HEMATURIA   Psoriasis    Wears dentures    full upper, partial lower.  Has, does not wear    SURGICAL HISTORY: Past Surgical History:  Procedure Laterality Date   COLONOSCOPY WITH PROPOFOL N/A 02/23/2017   Procedure: COLONOSCOPY WITH PROPOFOL;  Surgeon: [name], MD;  Location: [facility];  Service: Endoscopy;  Laterality: N/A;  Diabetic - oral meds   CYST EXCISION  Aug. 2016   on back   CYSTOSCOPY W/ RETROGRADES Right 06/28/2018   Procedure: CYSTOSCOPY WITH RETROGRADE PYELOGRAM;  Surgeon: Billey Co, MD;  Location: [facility];  Service: Urology;  Laterality: Right;   CYSTOSCOPY W/ URETERAL STENT REMOVAL Right 07/22/2018   Procedure: CYSTOSCOPY WITH STENT REMOVAL;  Surgeon: Billey Co, MD;  Location: [facility];  Service: Urology;  Laterality: Right;   CYSTOSCOPY WITH BIOPSY Right 06/28/2018   Procedure: CYSTOSCOPY WITH RENAL BIOPSY;  Surgeon: Billey Co, MD;  Location: [facility];  Service: Urology;  Laterality: Right;   CYSTOSCOPY WITH URETEROSCOPY AND STENT PLACEMENT Right 06/28/2018   Procedure: CYSTOSCOPY WITH URETEROSCOPY AND STENT PLACEMENT;  Surgeon: Billey Co, MD;  Location: [facility];  Service: Urology;  Laterality: Right;   FULGURATION OF BLADDER TUMOR N/A 06/28/2018   Procedure: CYSTOSCOPY BLADDER BIOPSY, FULGERATION OF BLADDER;  Surgeon: Billey Co, MD;  Location: [facility];  Service: Urology;  Laterality: N/A;   KNEE SURGERY Left    LAPAROSCOPIC NEPHRECTOMY, HAND  ASSISTED Right 07/22/2018   Procedure: HAND ASSISTED LAPAROSCOPIC NEPHRECTOMY;  Surgeon: Billey Co, MD;  Location: [facility];  Service: Urology;  Laterality: Right;   NASAL SINUS SURGERY  03/15   POLYPECTOMY N/A 02/23/2017   Procedure: POLYPECTOMY;  Surgeon: [name], MD;   Location: [facility];  Service: Endoscopy;  Laterality: N/A;   tooth abstraction     TRANSURETHRAL RESECTION OF BLADDER TUMOR N/A 05/12/2019   Procedure: TRANSURETHRAL RESECTION OF BLADDER TUMOR (TURBT);  Surgeon: Billey Co, MD;  Location: [facility];  Service: Urology;  Laterality: N/A;   VARICOCELE EXCISION     XI ROBOTIC ASSISTED VENTRAL HERNIA N/A 11/18/2020   Procedure: XI ROBOTIC ASSISTED VENTRAL HERNIA, incisional;  Surgeon: Jules Husbands, MD;  Location: [facility];  Service: General;  Laterality: N/A;    SOCIAL HISTORY: Social History   Socioeconomic History   Marital status: Married    Spouse name: Alma Friendly   Number of children: Not on file   Years of education: Not on file   Highest education level: Not on file  Occupational History   Not on file  Tobacco Use   Smoking status: Former    Types: Cigarettes    Quit date: 06/15/2013    Years since quitting: 8.0   Smokeless tobacco: Never  Vaping Use   Vaping Use: Never used  Substance and Sexual Activity   Alcohol use: No    Alcohol/week: 0.0 standard drinks   Drug use: No   Sexual activity: Yes  Other Topics Concern   Not on file  Social History Narrative   Lives with wife, sister in law   Social Determinants of Health   Financial Resource Strain: Not on file  Food Insecurity: Not on file  Transportation Needs: Not on file  Physical Activity: Not on file  Stress: Not on file  Social Connections: Not on file  Intimate Partner Violence: Not on file    FAMILY HISTORY: Family History  Problem Relation Age of Onset   Heart disease Father    Hypertension Father    COPD Father    Colon cancer Maternal Grandmother    Heart attack Paternal Grandfather     ALLERGIES:  is allergic to axitinib and glucotrol [glipizide].  MEDICATIONS:  Current Outpatient Medications  Medication Sig Dispense Refill   aspirin 81 MG EC tablet Take 81 mg by mouth daily with supper.     clobetasol ointment (TEMOVATE) 2.54 %  Apply 1 application topically 2 (two) times daily as needed for rash.     cyclobenzaprine (FLEXERIL) 10 MG tablet Take 10 mg by mouth 3 (three) times daily as needed for muscle spasms.     Glucose Blood (ACCU-CHEK AVIVA PLUS VI) by In Vitro route.     JARDIANCE 10 MG TABS tablet TAKE 1 TABLET BY MOUTH DAILY 30 tablet 2   lisinopril (PRINIVIL,ZESTRIL) 10 MG tablet TAKE 1 TABLET(10 MG) BY MOUTH DAILY (Patient taking differently: Take 10 mg by mouth daily with supper.) 90 tablet 1   metFORMIN (GLUCOPHAGE-XR) 500 MG 24 hr tablet Take 3 pills by mouth daily for 4-5 days, then increase to 4 pills daily (if tolerated) (Patient taking differently: Take 1,000 mg by mouth daily with supper.) 360 tablet 0   Multiple Vitamin (MULTIVITAMIN) tablet Take 1 tablet by mouth daily with supper.     omeprazole (PRILOSEC) 20 MG capsule Take 20 mg by mouth daily as needed ([place]).     OZEMPIC, 0.25 OR 0.5 MG/DOSE, 2 MG/1.5ML  SOPN Inject 1 mg into the skin every Sunday.     rosuvastatin (CRESTOR) 20 MG tablet Take 20 mg by mouth daily with supper.     traZODone (DESYREL) 50 MG tablet TAKE 1 TABLET(50 MG) BY MOUTH AT BEDTIME 30 tablet 3   HYDROcodone-acetaminophen (NORCO/VICODIN) 5-325 MG tablet Take 1-2 tablets by mouth every 4 (four) hours as needed for moderate pain. (Patient not taking: Reported on 05/31/2021) 20 tablet 0   Oxycodone HCl 10 MG TABS Take 10 mg by mouth every 4 (four) hours. (Patient not taking: Reported on 05/31/2021)     prochlorperazine (COMPAZINE) 10 MG tablet Take 1 tablet (10 mg total) by mouth every 6 (six) hours as needed for nausea or vomiting. (Patient not taking: Reported on 06/21/2021) 15 tablet 0   No current facility-administered medications for this visit.     PHYSICAL EXAMINATION: ECOG PERFORMANCE STATUS: 1 - Symptomatic but completely ambulatory Vitals:   06/21/21 0957  BP: (!) 153/85  Pulse: 82  Resp: 16  Temp: (!) 96.2 F (35.7 C)  SpO2: 100%   Filed Weights   06/21/21  0957  Weight: 218 lb (98.9 kg)    Physical Exam Constitutional:      General: He is not in acute distress.    Comments: Patient walks with a walker  HENT:     Head: Normocephalic and atraumatic.  Eyes:     General: No scleral icterus.    Pupils: Pupils are equal, round, and reactive to light.  Cardiovascular:     Rate and Rhythm: Regular rhythm. Tachycardia present.     Heart sounds: Normal heart sounds.  Pulmonary:     Effort: Pulmonary effort is normal. No respiratory distress.     Breath sounds: No wheezing.  Abdominal:     General: Bowel sounds are normal. There is no distension.     Palpations: Abdomen is soft. There is no mass.     Tenderness: There is no abdominal tenderness.  Musculoskeletal:        General: No deformity. Normal range of motion.     Cervical back: Normal range of motion and neck supple.     Comments: Right knee status post knee replacement.   Skin:    General: Skin is warm and dry.     Findings: No erythema or rash.  Neurological:     Mental Status: He is alert and oriented to person, place, and time. Mental status is at baseline.     Cranial Nerves: No cranial nerve deficit.     Coordination: Coordination normal.  Psychiatric:        Mood and Affect: Mood normal.    LABORATORY DATA:  I have reviewed the data as listed Lab Results  Component Value Date   WBC 7.3 06/21/2021   HGB 15.1 06/21/2021   HCT 45.9 06/21/2021   MCV 88.4 06/21/2021   PLT 241 06/21/2021   Recent Labs    05/10/21 0934 05/31/21 0831 06/21/21 0850  NA 133* 136 134*  K 4.1 4.4 4.4  CL 97* 100 101  CO2 26 28 27   GLUCOSE 139* 198* 150*  BUN 21* 22* 19  CREATININE 1.05 1.09 1.01  CALCIUM 9.4 9.7 9.3  GFRNONAA >60 >60 >60  PROT 7.9 7.6 7.7  ALBUMIN 4.6 4.5 4.6  AST 24 16 23   ALT 22 17 22   ALKPHOS 87 85 73  BILITOT 0.6 0.4 0.4    Iron/TIBC/Ferritin/ %Sat    Component Value Date/Time   IRON 118  09/25/2019 1115   TIBC 454 (H) 09/25/2019 1115   FERRITIN 236  09/25/2019 1115   IRONPCTSAT 26 09/25/2019 1115      RADIOGRAPHIC STUDIES: I have personally reviewed the radiological images as listed and agreed with the findings in the report.  CT CHEST ABDOMEN PELVIS WO CONTRAST  Result Date: 04/18/2021 CLINICAL DATA:  Follow-up metastatic renal cell carcinoma. Undergoing immunotherapy. EXAM: CT CHEST, ABDOMEN AND PELVIS WITHOUT CONTRAST TECHNIQUE: Multidetector CT imaging of the chest, abdomen and pelvis was performed following the standard protocol without IV contrast. COMPARISON:  01/14/2021 FINDINGS: CT CHEST FINDINGS Cardiovascular: No acute findings. Aortic and coronary atherosclerotic calcification noted. Mediastinum/Lymph Nodes: No masses or pathologically enlarged lymph nodes identified on this unenhanced exam. Lungs/Pleura: A few tiny less than 5 mm pulmonary nodules are again seen in both upper lobes, which remains stable. No new or enlarging pulmonary nodules or masses identified. Mild pleural-parenchymal scarring is again seen bilaterally. No evidence of pulmonary infiltrate or pleural effusion. Musculoskeletal:  No suspicious bone lesions identified. CT ABDOMEN AND PELVIS FINDINGS Hepatobiliary: No masses visualized on this unenhanced exam. Gallbladder is unremarkable. No evidence of biliary ductal dilatation. Pancreas: No mass or inflammatory changes identified on this unenhanced exam. Spleen:  Within normal limits in size. Adrenals/Urinary Tract: Normal adrenal glands. Prior right nephrectomy. Small low and high attenuation lesions in the posterior interpolar region of the left kidney remains stable, and have previously been characterized as Bosniak category 1 and 2 cysts on prior CT on 06/21/2018. No new renal lesions are seen on this unenhanced exam. No evidence of urolithiasis or hydronephrosis. Unremarkable unopacified urinary bladder. Stomach/Bowel: No evidence of obstruction, inflammatory process, or abnormal fluid collections. Normal appendix  visualized. Vascular/Lymphatic: No pathologically enlarged lymph nodes identified. No abdominal aortic aneurysm. Aortic atherosclerotic calcification noted. Reproductive:  No masses or other significant abnormality. Other:  None. Musculoskeletal:  No suspicious bone lesions identified. IMPRESSION: Stable exam. No evidence of recurrent or metastatic carcinoma within the chest, abdomen, or pelvis. Electronically Signed   By: Marlaine Hind M.D.   On: 04/18/2021 09:03       ASSESSMENT & PLAN:  1. Clear cell carcinoma of right kidney ([place])   2. Encounter for antineoplastic immunotherapy   3. 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 Labs reviewed and discussed with patient.  Proceed with Keytruda today. Patient has questions about goals of care and we reviewed   Follow-up in 3 weeks All questions were answered. The patient knows to call the clinic with any problems questions or concerns.  Earlie Server, MD, PhD 06/21/2021

## 2021-06-21 NOTE — Patient Instructions (Signed)
[facility]  Discharge Instructions: °Thank you for choosing [facility] to provide your oncology and hematology care.  °If you have a lab appointment with the Cancer Center, please go directly to the Cancer Center and check in at the registration area. ° °Wear comfortable clothing and clothing appropriate for easy access to any Portacath or PICC line.  ° °We strive to give you quality time with your provider. You may need to reschedule your appointment if you arrive late (15 or more minutes).  Arriving late affects you and other patients whose appointments are after yours.  Also, if you miss three or more appointments without notifying the office, you may be dismissed from the clinic at the provider’s discretion.    °  °For prescription refill requests, have your pharmacy contact our office and allow 72 hours for refills to be completed.   ° °Today you received the following chemotherapy and/or immunotherapy agents - pembrolizumab    °  °To help prevent nausea and vomiting after your treatment, we encourage you to take your nausea medication as directed. ° °BELOW ARE SYMPTOMS THAT SHOULD BE REPORTED IMMEDIATELY: °*FEVER GREATER THAN 100.4 F (38 °C) OR HIGHER °*CHILLS OR SWEATING °*NAUSEA AND VOMITING THAT IS NOT CONTROLLED WITH YOUR NAUSEA MEDICATION °*UNUSUAL SHORTNESS OF BREATH °*UNUSUAL BRUISING OR BLEEDING °*URINARY PROBLEMS (pain or burning when urinating, or frequent urination) °*BOWEL PROBLEMS (unusual diarrhea, constipation, pain near the anus) °TENDERNESS IN MOUTH AND THROAT WITH OR WITHOUT PRESENCE OF ULCERS (sore throat, sores in mouth, or a toothache) °UNUSUAL RASH, SWELLING OR PAIN  °UNUSUAL VAGINAL DISCHARGE OR ITCHING  ° °Items with * indicate a potential emergency and should be followed up as soon as possible or go to the Emergency Department if any problems should occur. ° °Please show the CHEMOTHERAPY ALERT CARD or IMMUNOTHERAPY ALERT CARD at  check-in to the Emergency Department and triage nurse. ° °Should you have questions after your visit or need to cancel or reschedule your appointment, please contact [facility]  [phone] and follow the prompts.  Office hours are 8:00 a.m. to 4:30 p.m. Monday - Friday. Please note that voicemails left after 4:00 p.m. may not be returned until the following business day.  We are closed weekends and major holidays. You have access to a nurse at all times for urgent questions. Please call the main number to the clinic [phone] and follow the prompts. ° °For any non-urgent questions, you may also contact your provider using MyChart. We now offer e-Visits for anyone 18 and older to request care online for non-urgent symptoms. For details visit [email or web address]. °  °Also download the MyChart app! Go to the app store, search "MyChart", open the app, select [facility], and log in with your MyChart username and password. ° °Due to Covid, a mask is required upon entering the hospital/clinic. If you do not have a mask, one will be given to you upon arrival. For doctor visits, patients may have 1 support person aged 18 or older with them. For treatment visits, patients cannot have anyone with them due to current Covid guidelines and our immunocompromised population.  ° °Pembrolizumab injection °What is this medication? °PEMBROLIZUMAB (pem broe liz ue mab) is a monoclonal antibody. It is used to treat certain types of cancer. °This medicine may be used for other purposes; ask your health care provider or pharmacist if you have questions. °COMMON BRAND NAME(S): Keytruda °What should I tell my care   team before I take this medication? °They need to know if you have any of these conditions: °autoimmune diseases like Crohn's disease, ulcerative colitis, or lupus °have had or planning to have an allogeneic stem cell transplant (uses someone else's stem cells) °history of organ  transplant °history of chest radiation °nervous system problems like myasthenia gravis or Guillain-Barre syndrome °an unusual or allergic reaction to pembrolizumab, other medicines, foods, dyes, or preservatives °pregnant or trying to get pregnant °breast-feeding °How should I use this medication? °This medicine is for infusion into a vein. It is given by a health care professional in a hospital or clinic setting. °A special MedGuide will be given to you before each treatment. Be sure to read this information carefully each time. °Talk to your pediatrician regarding the use of this medicine in children. While this drug may be prescribed for children as young as 6 months for selected conditions, precautions do apply. °Overdosage: If you think you have taken too much of this medicine contact a poison control center or emergency room at once. °NOTE: This medicine is only for you. Do not share this medicine with others. °What if I miss a dose? °It is important not to miss your dose. Call your doctor or health care professional if you are unable to keep an appointment. °What may interact with this medication? °Interactions have not been studied. °This list may not describe all possible interactions. Give your health care provider a list of all the medicines, herbs, non-prescription drugs, or dietary supplements you use. Also tell them if you smoke, drink alcohol, or use illegal drugs. Some items may interact with your medicine. °What should I watch for while using this medication? °Your condition will be monitored carefully while you are receiving this medicine. °You may need blood work done while you are taking this medicine. °Do not become pregnant while taking this medicine or for 4 months after stopping it. Women should inform their doctor if they wish to become pregnant or think they might be pregnant. There is a potential for serious side effects to an unborn child. Talk to your health care professional or  pharmacist for more information. Do not breast-feed an infant while taking this medicine or for 4 months after the last dose. °What side effects may I notice from receiving this medication? °Side effects that you should report to your doctor or health care professional as soon as possible: °allergic reactions like skin rash, itching or hives, swelling of the face, lips, or tongue °bloody or black, tarry °breathing problems °changes in vision °chest pain °chills °confusion °constipation °cough °diarrhea °dizziness or feeling faint or lightheaded °fast or irregular heartbeat °fever °flushing °joint pain °low blood counts - this medicine may decrease the number of white blood cells, red blood cells and platelets. You may be at increased risk for infections and bleeding. °muscle pain °muscle weakness °pain, tingling, numbness in the hands or feet °persistent headache °redness, blistering, peeling or loosening of the skin, including inside the mouth °signs and symptoms of high blood sugar such as dizziness; dry mouth; dry skin; fruity breath; nausea; stomach pain; increased hunger or thirst; increased urination °signs and symptoms of kidney injury like trouble passing urine or change in the amount of urine °signs and symptoms of liver injury like dark urine, light-colored stools, loss of appetite, nausea, right upper belly pain, yellowing of the eyes or skin °sweating °swollen lymph nodes °weight loss °Side effects that usually do not require medical attention (report to your doctor   or health care professional if they continue or are bothersome): °decreased appetite °hair loss °tiredness °This list may not describe all possible side effects. Call your doctor for medical advice about side effects. You may report side effects to FDA at 1-800-FDA-1088. °Where should I keep my medication? °This drug is given in a hospital or clinic and will not be stored at home. °NOTE: This sheet is a summary. It may not cover all possible  information. If you have questions about this medicine, talk to your doctor, pharmacist, or health care provider. °© 2022 [employer] (2021-01-18 00:00:00) ° °

## 2021-07-04 ENCOUNTER — Telehealth: Payer: Self-pay | Admitting: *Deleted

## 2021-07-04 NOTE — Telephone Encounter (Signed)
Received disability benefits form on Friday 07/01/21 at 1600. Patient requested forms to be completed/faxed by 07/06/21. I contacted the patient today (07/04/21) to acknowledge receipt of forms. Forms have been completed by by RN- pending Dr. Collie Siad signature.  Confirmed with patient that he presently would like the return to work date to be dated at the end of June. He is hoping to return in June and have his benefits be available to him until he retires. He had inquired if work with lifting restrictions was available at work if he would go back FT at this time. His workplace currently does not have any work for him for lifting restrictions. He feels that it is best at this time to date his return to work date in June for this reason. Pt stated that he has already discussed this return to work plan with Dr. Tasia Catchings.

## 2021-07-04 NOTE — Telephone Encounter (Signed)
Form signed/faxed. Original sent to front reception desk for patient pick up.  Copy of forms sent to HIM to be scanned into pt's chart.

## 2021-07-12 ENCOUNTER — Inpatient Hospital Stay: Payer: BC Managed Care – PPO

## 2021-07-12 ENCOUNTER — Encounter: Payer: Self-pay | Admitting: Oncology

## 2021-07-12 ENCOUNTER — Inpatient Hospital Stay (HOSPITAL_BASED_OUTPATIENT_CLINIC_OR_DEPARTMENT_OTHER): Payer: BC Managed Care – PPO | Admitting: Oncology

## 2021-07-12 ENCOUNTER — Other Ambulatory Visit: Payer: Self-pay

## 2021-07-12 VITALS — BP 144/82 | HR 83 | Temp 97.8°F | Wt 218.0 lb

## 2021-07-12 DIAGNOSIS — Z5112 Encounter for antineoplastic immunotherapy: Secondary | ICD-10-CM | POA: Diagnosis not present

## 2021-07-12 DIAGNOSIS — R197 Diarrhea, unspecified: Secondary | ICD-10-CM | POA: Diagnosis not present

## 2021-07-12 DIAGNOSIS — R21 Rash and other nonspecific skin eruption: Secondary | ICD-10-CM

## 2021-07-12 DIAGNOSIS — C641 Malignant neoplasm of right kidney, except renal pelvis: Secondary | ICD-10-CM

## 2021-07-12 LAB — COMPREHENSIVE METABOLIC PANEL
ALT: 20 U/L (ref 0–44)
AST: 23 U/L (ref 15–41)
Albumin: 4.4 g/dL (ref 3.5–5.0)
Alkaline Phosphatase: 72 U/L (ref 38–126)
Anion gap: 9 (ref 5–15)
BUN: 20 mg/dL (ref 6–20)
CO2: 25 mmol/L (ref 22–32)
Calcium: 9.7 mg/dL (ref 8.9–10.3)
Chloride: 101 mmol/L (ref 98–111)
Creatinine, Ser: 1.11 mg/dL (ref 0.61–1.24)
GFR, Estimated: >60 mL/min (ref >60)
Glucose, Bld: 136 mg/dL — ABNORMAL HIGH (ref 70–99)
Potassium: 4.3 mmol/L (ref 3.5–5.1)
Sodium: 135 mmol/L (ref 135–145)
Total Bilirubin: 0.5 mg/dL (ref 0.3–1.2)
Total Protein: 7.7 g/dL (ref 6.5–8.1)

## 2021-07-12 LAB — CBC WITH DIFFERENTIAL/PLATELET
Abs Immature Granulocytes: 0.06 10*3/uL (ref 0.00–0.07)
Basophils Absolute: 0 10*3/uL (ref 0.0–0.1)
Basophils Relative: 1 %
Eosinophils Absolute: 0.3 10*3/uL (ref 0.0–0.5)
Eosinophils Relative: 3 %
HCT: 44.8 % (ref 39.0–52.0)
Hemoglobin: 14.9 g/dL (ref 13.0–17.0)
Immature Granulocytes: 1 %
Lymphocytes Relative: 22 %
Lymphs Abs: 1.7 10*3/uL (ref 0.7–4.0)
MCH: 29.4 pg (ref 26.0–34.0)
MCHC: 33.3 g/dL (ref 30.0–36.0)
MCV: 88.5 fL (ref 80.0–100.0)
Monocytes Absolute: 0.6 10*3/uL (ref 0.1–1.0)
Monocytes Relative: 8 %
Neutro Abs: 5 10*3/uL (ref 1.7–7.7)
Neutrophils Relative %: 65 %
Platelets: 217 10*3/uL (ref 150–400)
RBC: 5.06 MIL/uL (ref 4.22–5.81)
RDW: 13.3 % (ref 11.5–15.5)
WBC: 7.7 10*3/uL (ref 4.0–10.5)
nRBC: 0 % (ref 0.0–0.2)

## 2021-07-12 LAB — T4, FREE: Free T4: 0.97 ng/dL (ref 0.61–1.12)

## 2021-07-12 LAB — TSH: TSH: 0.931 u[IU]/mL (ref 0.350–4.500)

## 2021-07-12 NOTE — Progress Notes (Signed)
Hematology/Oncology Progress note [phone] [phone]      Patient Care Team: Danae Orleans, MD as PCP - General (Internal Medicine) [name], [name], MD as Consulting Physician (Dermatology) Earlie Server, MD as Consulting Physician (Oncology)  REFERRING PROVIDER: Danae Orleans, MD  CHIEF COMPLAINTS/REASON FOR VISIT:  Follow-up of kidney cancer  HISTORY OF PRESENTING ILLNESS:   Matthew Woodard is a  56 y.o.  male with PMH listed below was seen in consultation at the request of  Danae Orleans, MD  for evaluation of kidney cancer Patient's cancer history dated back to February 2020 when he developed gross hematuria with small clots and right-sided flank pain.  Patient had a CT urogram done which showed right 4 cm central enhancing renal mass with invasion into the collecting system.  X-ray of chest was performed to see complete staging which showed no concerning findings. 06/28/2018 patient underwent a cystoscopy, bladder biopsy and a right retrograde pyelogram with intraoperative interpretation, right diagnostic ureteroscopy, right renal pelvis biopsy, right ureteral stent placement.  biopsy showed atypical cell clusters, with extensive crush artifact, nondiagnostic.   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  Surgical margins negative for tumor.pT3a,Nx Patient has been on surveillance after surgery. 10/28/2018 CT abdomen pelvis with contrast showed minimal fluid and or postoperative changes within the right renal fossa.  No evidence of metastatic disease or recurrence.  Patient has left kidney lesion previously characterized as nonenhancing cyst by multi phasic contrast-enhanced CT. Patient reports an episode of gross hematuria in early December 2020, patient is due for 15-month  surveillance CT scan. 04/30/2019 CT abdomen pelvis with contrast showed high attenuation mass at the right uretero vesicle junction, with extension into the bladder.  Bilateral pulmonary nodules, most indicated for metastatic disease.   Patient underwent cystoscopy and transurethral resection of irregular bladder tumor 3 cm.  Mass appeared to emanate from the right ureteral orifice.  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 Patient was referred to me for further work-up and management.  #Staging chest CT with contrast showed numerous bilateral pulmonary nodules.  Compatible with metastatic disease. Bone scan showed no osseous metastatic disease.   #History of tobacco abuse, former smoker.  Quit in 2015.  He denies any hemoptysis, chest pain, shortness of breath today. Patient reports that hematuria has completely resolved.  Denies any pain today. He is accompanied by his wife.  #History of cutaneous psoriasis, mostly on his right hand, currently on weekly methotrexate 12.5 mg with good symptom control. #NGS: [facility] PD L1 TPS 1%  # patient was referred to ENT for evaluation of headache and sinus pressure.  He had a ENT evaluation and feels that patient does not have sinusitis.  [name] recommended MRI brain to rule out PRES syndrome.  Axitinib has been held MRI brain was done and was negative.   #09/2019 off axitinib and Keytruda since the beginning of May due to transaminitis. Patient was seen and evaluated by gastroenterology [name]. Work-up was negative. Ultrasound liver vascular Doppler is negative. Recommend observation. #11/24/2019, resumed on Keytruda every 3 weeks #01/05/2020, axitinib was resumed 3 mg #01/20/2020, axitinib was discontinued due to recurrence of transaminitis  Beryle Flock was temporarily held. #03/16/2020, resumed on Keytruda every 3 weeks. 09/29/2020 CT scan showed no evidence of local recurrence or new/progressive disease  in the chest abdomen or  pelvis.  Tiny nonspecific lung nodules are unchanged.  No new lung nodules. May 2022, left knee replacement 01/14/2021 CT chest abdomen pelvis showed stable exam, no new or progressive metastatic disease within chest abdomen pelvis. Stable lung nodules. Aortic atherosclerosis.  Sept 2022 right knee replacement 04/15/2021, CT chest abdomen pelvis with contrast showed stable examination.  No evidence of recurrent or metastatic carcinoma within the chest abdomen pelvis. INTERVAL HISTORY Matthew Woodard is a 56 y.o. male who has above history reviewed by me today presents for follow up visit for management of metastatic RCC. Patient has no new complaints. Denies any shortness of breath,  Patient had experienced intermittent loose bowel movements for the past 1 month.  He has utilized Imodium sometimes.   Review of Systems  Constitutional:  Negative for appetite change, chills, fatigue, fever and unexpected weight change.  HENT:   Negative for hearing loss and voice change.   Eyes:  Negative for eye problems and icterus.  Respiratory:  Negative for chest tightness, cough and shortness of breath.   Cardiovascular:  Negative for chest pain and leg swelling.  Gastrointestinal:  Positive for diarrhea. Negative for abdominal distention, abdominal pain, blood in stool and nausea.  Endocrine: Negative for hot flashes.  Genitourinary:  Negative for difficulty urinating, dysuria and frequency.   Musculoskeletal:  Positive for arthralgias.       Right knee replacement  Skin:  Negative for itching and rash.  Neurological:  Negative for extremity weakness, headaches, light-headedness and numbness.  Hematological:  Negative for adenopathy. Does not bruise/bleed easily.  Psychiatric/Behavioral:  Negative for confusion.    MEDICAL HISTORY:  Past Medical History:  Diagnosis Date   Arthritis    hands, left knee   Cancer ([place])    Diabetes mellitus without complication ([place])     Eczema 01/08/2017   GERD (gastroesophageal reflux disease)    History of kidney cancer    Hyperlipidemia LDL goal <100 11/02/2014   Hypertension    Kidney stone    YEAR H/O HEMATURIA   Psoriasis    Wears dentures    full upper, partial lower.  Has, does not wear    SURGICAL HISTORY: Past Surgical History:  Procedure Laterality Date   COLONOSCOPY WITH PROPOFOL N/A 02/23/2017   Procedure: COLONOSCOPY WITH PROPOFOL;  Surgeon: [name], MD;  Location: [facility];  Service: Endoscopy;  Laterality: N/A;  Diabetic - oral meds   CYST EXCISION  Aug. 2016   on back   CYSTOSCOPY W/ RETROGRADES Right 06/28/2018   Procedure: CYSTOSCOPY WITH RETROGRADE PYELOGRAM;  Surgeon: Billey Co, MD;  Location: [facility];  Service: Urology;  Laterality: Right;   CYSTOSCOPY W/ URETERAL STENT REMOVAL Right 07/22/2018   Procedure: CYSTOSCOPY WITH STENT REMOVAL;  Surgeon: Billey Co, MD;  Location: [facility];  Service: Urology;  Laterality: Right;   CYSTOSCOPY WITH BIOPSY Right 06/28/2018   Procedure: CYSTOSCOPY WITH RENAL BIOPSY;  Surgeon: Billey Co, MD;  Location: [facility];  Service: Urology;  Laterality: Right;   CYSTOSCOPY WITH URETEROSCOPY AND STENT PLACEMENT Right 06/28/2018   Procedure: CYSTOSCOPY WITH URETEROSCOPY AND STENT PLACEMENT;  Surgeon: Billey Co, MD;  Location: [facility];  Service: Urology;  Laterality: Right;   FULGURATION OF BLADDER TUMOR N/A 06/28/2018   Procedure: CYSTOSCOPY BLADDER BIOPSY, FULGERATION OF BLADDER;  Surgeon: Billey Co, MD;  Location: [facility];  Service: Urology;  Laterality: N/A;   KNEE SURGERY Left    LAPAROSCOPIC NEPHRECTOMY, HAND ASSISTED Right 07/22/2018  Procedure: HAND ASSISTED LAPAROSCOPIC NEPHRECTOMY;  Surgeon: Billey Co, MD;  Location: [facility];  Service: Urology;  Laterality: Right;   NASAL SINUS SURGERY  03/15   POLYPECTOMY N/A 02/23/2017   Procedure: POLYPECTOMY;  Surgeon: [name], MD;  Location: [facility];   Service: Endoscopy;  Laterality: N/A;   tooth abstraction     TRANSURETHRAL RESECTION OF BLADDER TUMOR N/A 05/12/2019   Procedure: TRANSURETHRAL RESECTION OF BLADDER TUMOR (TURBT);  Surgeon: Billey Co, MD;  Location: [facility];  Service: Urology;  Laterality: N/A;   VARICOCELE EXCISION     XI ROBOTIC ASSISTED VENTRAL HERNIA N/A 11/18/2020   Procedure: XI ROBOTIC ASSISTED VENTRAL HERNIA, incisional;  Surgeon: Jules Husbands, MD;  Location: [facility];  Service: General;  Laterality: N/A;    SOCIAL HISTORY: Social History   Socioeconomic History   Marital status: Married    Spouse name: Alma Friendly   Number of children: Not on file   Years of education: Not on file   Highest education level: Not on file  Occupational History   Not on file  Tobacco Use   Smoking status: Former    Types: Cigarettes    Quit date: 06/15/2013    Years since quitting: 8.0   Smokeless tobacco: Never  Vaping Use   Vaping Use: Never used  Substance and Sexual Activity   Alcohol use: No    Alcohol/week: 0.0 standard drinks   Drug use: No   Sexual activity: Yes  Other Topics Concern   Not on file  Social History Narrative   Lives with wife, sister in law   Social Determinants of Health   Financial Resource Strain: Not on file  Food Insecurity: Not on file  Transportation Needs: Not on file  Physical Activity: Not on file  Stress: Not on file  Social Connections: Not on file  Intimate Partner Violence: Not on file    FAMILY HISTORY: Family History  Problem Relation Age of Onset   Heart disease Father    Hypertension Father    COPD Father    Colon cancer Maternal Grandmother    Heart attack Paternal Grandfather     ALLERGIES:  is allergic to axitinib and glucotrol [glipizide].  MEDICATIONS:  Current Outpatient Medications  Medication Sig Dispense Refill   aspirin 81 MG EC tablet Take 81 mg by mouth daily with supper.     clobetasol ointment (TEMOVATE) 6.72 % Apply 1 application topically 2  (two) times daily as needed for rash.     cyclobenzaprine (FLEXERIL) 10 MG tablet Take 10 mg by mouth 3 (three) times daily as needed for muscle spasms.     Glucose Blood (ACCU-CHEK AVIVA PLUS VI) by In Vitro route.     JARDIANCE 10 MG TABS tablet TAKE 1 TABLET BY MOUTH DAILY 30 tablet 2   metFORMIN (GLUCOPHAGE-XR) 500 MG 24 hr tablet Take 3 pills by mouth daily for 4-5 days, then increase to 4 pills daily (if tolerated) (Patient taking differently: Take 1,000 mg by mouth daily with supper.) 360 tablet 0   Multiple Vitamin (MULTIVITAMIN) tablet Take 1 tablet by mouth daily with supper.     omeprazole (PRILOSEC) 20 MG capsule Take 20 mg by mouth daily as needed ([place]).     OZEMPIC, 0.25 OR 0.5 MG/DOSE, 2 MG/1.5ML SOPN Inject 1 mg into the skin every Sunday.     rosuvastatin (CRESTOR) 20 MG tablet Take 20 mg by mouth daily with supper.     traZODone (DESYREL) 50  MG tablet TAKE 1 TABLET(50 MG) BY MOUTH AT BEDTIME 30 tablet 3   HYDROcodone-acetaminophen (NORCO/VICODIN) 5-325 MG tablet Take 1-2 tablets by mouth every 4 (four) hours as needed for moderate pain. (Patient not taking: Reported on 05/31/2021) 20 tablet 0   lisinopril (PRINIVIL,ZESTRIL) 10 MG tablet TAKE 1 TABLET(10 MG) BY MOUTH DAILY (Patient not taking: Reported on 07/12/2021) 90 tablet 1   Oxycodone HCl 10 MG TABS Take 10 mg by mouth every 4 (four) hours. (Patient not taking: Reported on 05/31/2021)     prochlorperazine (COMPAZINE) 10 MG tablet Take 1 tablet (10 mg total) by mouth every 6 (six) hours as needed for nausea or vomiting. (Patient not taking: Reported on 06/21/2021) 15 tablet 0   No current facility-administered medications for this visit.     PHYSICAL EXAMINATION: ECOG PERFORMANCE STATUS: 1 - Symptomatic but completely ambulatory Vitals:   07/12/21 0908  BP: (!) 144/82  Pulse: 83  Temp: 97.8 F (36.6 C)   Filed Weights   07/12/21 0908  Weight: 218 lb (98.9 kg)    Physical Exam Constitutional:      General: He  is not in acute distress.    Comments: Patient walks with a walker  HENT:     Head: Normocephalic and atraumatic.  Eyes:     General: No scleral icterus.    Pupils: Pupils are equal, round, and reactive to light.  Cardiovascular:     Rate and Rhythm: Regular rhythm. Tachycardia present.     Heart sounds: Normal heart sounds.  Pulmonary:     Effort: Pulmonary effort is normal. No respiratory distress.     Breath sounds: No wheezing.  Abdominal:     General: Bowel sounds are normal. There is no distension.     Palpations: Abdomen is soft. There is no mass.     Tenderness: There is no abdominal tenderness.  Musculoskeletal:        General: No deformity. Normal range of motion.     Cervical back: Normal range of motion and neck supple.     Comments: Right knee status post knee replacement.   Skin:    General: Skin is warm and dry.     Findings: No erythema or rash.  Neurological:     Mental Status: He is alert and oriented to person, place, and time. Mental status is at baseline.     Cranial Nerves: No cranial nerve deficit.     Coordination: Coordination normal.  Psychiatric:        Mood and Affect: Mood normal.    LABORATORY DATA:  I have reviewed the data as listed Lab Results  Component Value Date   WBC 7.7 07/12/2021   HGB 14.9 07/12/2021   HCT 44.8 07/12/2021   MCV 88.5 07/12/2021   PLT 217 07/12/2021   Recent Labs    05/31/21 0831 06/21/21 0850 07/12/21 0825  NA 136 134* 135  K 4.4 4.4 4.3  CL 100 101 101  CO2 28 27 25   GLUCOSE 198* 150* 136*  BUN 22* 19 20  CREATININE 1.09 1.01 1.11  CALCIUM 9.7 9.3 9.7  GFRNONAA >60 >60 >60  PROT 7.6 7.7 7.7  ALBUMIN 4.5 4.6 4.4  AST 16 23 23   ALT 17 22 20   ALKPHOS 85 73 72  BILITOT 0.4 0.4 0.5    Iron/TIBC/Ferritin/ %Sat    Component Value Date/Time   IRON 118 09/25/2019 1115   TIBC 454 (H) 09/25/2019 1115   FERRITIN 236 09/25/2019 1115  IRONPCTSAT 26 09/25/2019 1115      RADIOGRAPHIC STUDIES: I have  personally reviewed the radiological images as listed and agreed with the findings in the report.  CT CHEST ABDOMEN PELVIS WO CONTRAST  Result Date: 04/18/2021 CLINICAL DATA:  Follow-up metastatic renal cell carcinoma. Undergoing immunotherapy. EXAM: CT CHEST, ABDOMEN AND PELVIS WITHOUT CONTRAST TECHNIQUE: Multidetector CT imaging of the chest, abdomen and pelvis was performed following the standard protocol without IV contrast. COMPARISON:  01/14/2021 FINDINGS: CT CHEST FINDINGS Cardiovascular: No acute findings. Aortic and coronary atherosclerotic calcification noted. Mediastinum/Lymph Nodes: No masses or pathologically enlarged lymph nodes identified on this unenhanced exam. Lungs/Pleura: A few tiny less than 5 mm pulmonary nodules are again seen in both upper lobes, which remains stable. No new or enlarging pulmonary nodules or masses identified. Mild pleural-parenchymal scarring is again seen bilaterally. No evidence of pulmonary infiltrate or pleural effusion. Musculoskeletal:  No suspicious bone lesions identified. CT ABDOMEN AND PELVIS FINDINGS Hepatobiliary: No masses visualized on this unenhanced exam. Gallbladder is unremarkable. No evidence of biliary ductal dilatation. Pancreas: No mass or inflammatory changes identified on this unenhanced exam. Spleen:  Within normal limits in size. Adrenals/Urinary Tract: Normal adrenal glands. Prior right nephrectomy. Small low and high attenuation lesions in the posterior interpolar region of the left kidney remains stable, and have previously been characterized as Bosniak category 1 and 2 cysts on prior CT on 06/21/2018. No new renal lesions are seen on this unenhanced exam. No evidence of urolithiasis or hydronephrosis. Unremarkable unopacified urinary bladder. Stomach/Bowel: No evidence of obstruction, inflammatory process, or abnormal fluid collections. Normal appendix visualized. Vascular/Lymphatic: No pathologically enlarged lymph nodes identified. No  abdominal aortic aneurysm. Aortic atherosclerotic calcification noted. Reproductive:  No masses or other significant abnormality. Other:  None. Musculoskeletal:  No suspicious bone lesions identified. IMPRESSION: Stable exam. No evidence of recurrent or metastatic carcinoma within the chest, abdomen, or pelvis. Electronically Signed   By: Marlaine Hind M.D.   On: 04/18/2021 09:03       ASSESSMENT & PLAN:  1. Clear cell carcinoma of right kidney ([place])   2. Encounter for antineoplastic immunotherapy   3. 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 Labs reviewed and discussed with patient.   Hold  Keytruda treatment today.  #Immunotherapy induced diarrhea, discussed with patient about Imodium as needed instructions.  Hold treatments.  Repeat CT scan in 4 weeks.  Patient follow-up a few days after CT scan for lab MD possible Keytruda.  All questions were answered. The patient knows to call the clinic with any problems questions or concerns.  Earlie Server, MD, PhD 07/12/2021

## 2021-07-12 NOTE — Progress Notes (Signed)
Patient here for follow up. Patient complains of soft and diarrhea x 1 month.

## 2021-07-22 ENCOUNTER — Encounter: Payer: Self-pay | Admitting: Oncology

## 2021-07-22 NOTE — Addendum Note (Signed)
Addended by: Earlie Server on: 07/22/2021 10:52 PM   Modules accepted: Orders

## 2021-08-01 ENCOUNTER — Other Ambulatory Visit: Payer: Self-pay

## 2021-08-01 ENCOUNTER — Encounter: Payer: Self-pay | Admitting: Oncology

## 2021-08-01 DIAGNOSIS — R197 Diarrhea, unspecified: Secondary | ICD-10-CM

## 2021-08-01 NOTE — Telephone Encounter (Signed)
Dr. Tasia Catchings looks like pt is on PRN imodium, is there anything else he can try for diarrhea ? ?Jerene Pitch, will you please move appts to be after 4/1 (per pt request) please.  ?

## 2021-08-02 ENCOUNTER — Ambulatory Visit
Admission: RE | Admit: 2021-08-02 | Discharge: 2021-08-02 | Disposition: A | Discharge status: Home / Self Care | Payer: BC Managed Care – PPO | Source: Ambulatory Visit | Attending: Oncology | Admitting: Oncology

## 2021-08-02 ENCOUNTER — Other Ambulatory Visit: Payer: Self-pay | Admitting: Oncology

## 2021-08-02 ENCOUNTER — Other Ambulatory Visit: Payer: Self-pay

## 2021-08-02 DIAGNOSIS — C641 Malignant neoplasm of right kidney, except renal pelvis: Secondary | ICD-10-CM | POA: Insufficient documentation

## 2021-08-02 DIAGNOSIS — R197 Diarrhea, unspecified: Secondary | ICD-10-CM

## 2021-08-04 ENCOUNTER — Other Ambulatory Visit: Payer: Self-pay

## 2021-08-04 DIAGNOSIS — R197 Diarrhea, unspecified: Secondary | ICD-10-CM | POA: Insufficient documentation

## 2021-08-04 LAB — C DIFFICILE QUICK SCREEN W PCR REFLEX
C Diff antigen: NEGATIVE
C Diff interpretation: NOT DETECTED
C Diff toxin: NEGATIVE

## 2021-08-04 LAB — GASTROINTESTINAL PANEL BY PCR, STOOL (REPLACES STOOL CULTURE)

## 2021-08-09 ENCOUNTER — Ambulatory Visit: Payer: BC Managed Care – PPO | Admitting: Oncology

## 2021-08-09 ENCOUNTER — Inpatient Hospital Stay: Payer: Self-pay

## 2021-08-09 ENCOUNTER — Ambulatory Visit: Payer: BC Managed Care – PPO

## 2021-08-12 ENCOUNTER — Encounter: Payer: Self-pay | Admitting: Oncology

## 2021-08-17 ENCOUNTER — Encounter: Payer: Self-pay | Admitting: Oncology

## 2021-08-17 ENCOUNTER — Inpatient Hospital Stay: Payer: 59 | Attending: Oncology | Admitting: Oncology

## 2021-08-17 ENCOUNTER — Inpatient Hospital Stay: Payer: 59

## 2021-08-17 VITALS — BP 135/92 | HR 113 | Temp 97.3°F | Wt 211.6 lb

## 2021-08-17 DIAGNOSIS — Z905 Acquired absence of kidney: Secondary | ICD-10-CM | POA: Insufficient documentation

## 2021-08-17 DIAGNOSIS — K521 Toxic gastroenteritis and colitis: Secondary | ICD-10-CM | POA: Insufficient documentation

## 2021-08-17 DIAGNOSIS — C641 Malignant neoplasm of right kidney, except renal pelvis: Secondary | ICD-10-CM | POA: Insufficient documentation

## 2021-08-17 DIAGNOSIS — C7911 Secondary malignant neoplasm of bladder: Secondary | ICD-10-CM | POA: Insufficient documentation

## 2021-08-17 DIAGNOSIS — F4321 Adjustment disorder with depressed mood: Secondary | ICD-10-CM

## 2021-08-17 DIAGNOSIS — R197 Diarrhea, unspecified: Secondary | ICD-10-CM

## 2021-08-17 DIAGNOSIS — E876 Hypokalemia: Secondary | ICD-10-CM

## 2021-08-17 DIAGNOSIS — R21 Rash and other nonspecific skin eruption: Secondary | ICD-10-CM

## 2021-08-17 DIAGNOSIS — E1165 Type 2 diabetes mellitus with hyperglycemia: Secondary | ICD-10-CM | POA: Diagnosis not present

## 2021-08-17 DIAGNOSIS — Z7952 Long term (current) use of systemic steroids: Secondary | ICD-10-CM | POA: Diagnosis not present

## 2021-08-17 DIAGNOSIS — Z7982 Long term (current) use of aspirin: Secondary | ICD-10-CM | POA: Diagnosis not present

## 2021-08-17 DIAGNOSIS — Z87891 Personal history of nicotine dependence: Secondary | ICD-10-CM | POA: Insufficient documentation

## 2021-08-17 DIAGNOSIS — Z5112 Encounter for antineoplastic immunotherapy: Secondary | ICD-10-CM

## 2021-08-17 DIAGNOSIS — R000 Tachycardia, unspecified: Secondary | ICD-10-CM | POA: Insufficient documentation

## 2021-08-17 DIAGNOSIS — Z79899 Other long term (current) drug therapy: Secondary | ICD-10-CM | POA: Diagnosis not present

## 2021-08-17 DIAGNOSIS — Z7984 Long term (current) use of oral hypoglycemic drugs: Secondary | ICD-10-CM | POA: Insufficient documentation

## 2021-08-17 DIAGNOSIS — F419 Anxiety disorder, unspecified: Secondary | ICD-10-CM | POA: Insufficient documentation

## 2021-08-17 DIAGNOSIS — K209 Esophagitis, unspecified without bleeding: Secondary | ICD-10-CM

## 2021-08-17 LAB — CBC WITH DIFFERENTIAL/PLATELET
Abs Immature Granulocytes: 0.05 10*3/uL (ref 0.00–0.07)
Basophils Absolute: 0 10*3/uL (ref 0.0–0.1)
Basophils Relative: 0 %
Eosinophils Absolute: 0 10*3/uL (ref 0.0–0.5)
Eosinophils Relative: 0 %
HCT: 43.3 % (ref 39.0–52.0)
Hemoglobin: 15.2 g/dL (ref 13.0–17.0)
Immature Granulocytes: 1 %
Lymphocytes Relative: 16 %
Lymphs Abs: 1.5 10*3/uL (ref 0.7–4.0)
MCH: 29.9 pg (ref 26.0–34.0)
MCHC: 35.1 g/dL (ref 30.0–36.0)
MCV: 85.2 fL (ref 80.0–100.0)
Monocytes Absolute: 0.9 10*3/uL (ref 0.1–1.0)
Monocytes Relative: 10 %
Neutro Abs: 6.8 10*3/uL (ref 1.7–7.7)
Neutrophils Relative %: 73 %
Platelets: 261 10*3/uL (ref 150–400)
RBC: 5.08 MIL/uL (ref 4.22–5.81)
RDW: 12.9 % (ref 11.5–15.5)
WBC: 9.3 10*3/uL (ref 4.0–10.5)
nRBC: 0 % (ref 0.0–0.2)

## 2021-08-17 LAB — COMPREHENSIVE METABOLIC PANEL
ALT: 14 U/L (ref 0–44)
AST: 21 U/L (ref 15–41)
Albumin: 4.1 g/dL (ref 3.5–5.0)
Alkaline Phosphatase: 62 U/L (ref 38–126)
Anion gap: 7 (ref 5–15)
BUN: 19 mg/dL (ref 6–20)
CO2: 24 mmol/L (ref 22–32)
Calcium: 8.9 mg/dL (ref 8.9–10.3)
Chloride: 100 mmol/L (ref 98–111)
Creatinine, Ser: 1.11 mg/dL (ref 0.61–1.24)
GFR, Estimated: >60 mL/min (ref >60)
Glucose, Bld: 162 mg/dL — ABNORMAL HIGH (ref 70–99)
Potassium: 3.4 mmol/L — ABNORMAL LOW (ref 3.5–5.1)
Sodium: 131 mmol/L — ABNORMAL LOW (ref 135–145)
Total Bilirubin: 0.9 mg/dL (ref 0.3–1.2)
Total Protein: 6.9 g/dL (ref 6.5–8.1)

## 2021-08-17 MED ORDER — ALPRAZOLAM 0.5 MG PO TABS: 0.5000 mg | ORAL_TABLET | Freq: Two times a day (BID) | ORAL | 0 refills | Status: DC

## 2021-08-17 MED ORDER — POTASSIUM CHLORIDE CRYS ER 10 MEQ PO TBCR: 10.0000 meq | EXTENDED_RELEASE_TABLET | Freq: Every day | ORAL | 0 refills | Status: DC

## 2021-08-17 MED ORDER — DIPHENOXYLATE-ATROPINE 2.5-0.025 MG PO TABS: 1.0000 | ORAL_TABLET | Freq: Four times a day (QID) | ORAL | 1 refills | Status: DC | PRN

## 2021-08-17 MED ORDER — SODIUM CHLORIDE 0.9 % IV SOLN
INTRAVENOUS | Status: DC
  Filled 2021-08-17: qty 250

## 2021-08-17 MED ORDER — SODIUM CHLORIDE 0.9 % IV SOLN
Freq: Once | INTRAVENOUS | Status: AC
  Filled 2021-08-17: qty 250

## 2021-08-17 MED ORDER — DEXAMETHASONE SODIUM PHOSPHATE 10 MG/ML IJ SOLN
10.0000 mg | Freq: Once | INTRAMUSCULAR | Status: AC
  Administered 2021-08-17: 10 mg via INTRAVENOUS
  Filled 2021-08-17: qty 1

## 2021-08-17 MED ORDER — POTASSIUM CHLORIDE 20 MEQ/100ML IV SOLN
20.0000 meq | Freq: Once | INTRAVENOUS | Status: AC
  Administered 2021-08-17: 20 meq via INTRAVENOUS

## 2021-08-17 MED ORDER — PREDNISONE 20 MG PO TABS: 40.0000 mg | ORAL_TABLET | Freq: Every day | ORAL | 0 refills | Status: DC

## 2021-08-17 MED ORDER — SODIUM CHLORIDE 0.9 % IV SOLN: 10.0000 mg | Freq: Once | INTRAVENOUS | Status: DC

## 2021-08-17 NOTE — Progress Notes (Signed)
?Hematology/Oncology Progress note ?Telephone:([phone]) [phone] [phone] ?  ? ? ? ?Patient Care Team: ?Danae Orleans, MD as PCP - General (Internal Medicine) ?[name], [name], MD as Consulting Physician (Dermatology) ?Earlie Server, MD as Consulting Physician (Oncology) ? ?REFERRING PROVIDER: ?Danae Orleans, MD  ?CHIEF COMPLAINTS/REASON FOR VISIT:  ?Follow-up of kidney cancer ? ?HISTORY OF PRESENTING ILLNESS:  ? ?Matthew Woodard is a  56 y.o.  male with PMH listed below was seen in consultation at the request of  Danae Orleans, MD  for evaluation of kidney cancer ?Patient's cancer history dated back to February 2020 when he developed gross hematuria with small clots and right-sided flank pain.  Patient had a CT urogram done which showed right 4 cm central enhancing renal mass with invasion into the collecting system.  X-ray of chest was performed to see complete staging which showed no concerning findings. ?06/28/2018 patient underwent a cystoscopy, bladder biopsy and a right retrograde pyelogram with intraoperative interpretation, right diagnostic ureteroscopy, right renal pelvis biopsy, right ureteral stent placement.  biopsy showed atypical cell clusters, with extensive crush artifact, nondiagnostic.   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  Surgical margins negative for tumor.pT3a,Nx ?Patient has been on surveillance after surgery. ?10/28/2018 CT abdomen pelvis with contrast showed minimal fluid and or postoperative changes within the right renal fossa.  No evidence of metastatic disease or recurrence.  Patient has left kidney lesion previously characterized as nonenhancing cyst by multi phasic contrast-enhanced CT. ?Patient reports an episode of gross hematuria in early December 2020, patient is due for 71-month surveillance CT scan. ?04/30/2019 CT abdomen pelvis with contrast showed high attenuation mass at the right uretero vesicle junction, with extension into the bladder.  Bilateral pulmonary nodules, most indicated for metastatic disease. ?  ?Patient underwent cystoscopy and transurethral resection of irregular bladder tumor 3 cm.  Mass appeared to emanate from the right ureteral orifice.  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 ?Patient was referred to me for further work-up and management. ? ?#Staging chest CT with contrast showed numerous bilateral pulmonary nodules.  Compatible with metastatic disease. ?Bone scan showed no osseous metastatic disease. ? ? ?#History of tobacco abuse, former smoker.  Quit in 2015.  He denies any hemoptysis, chest pain, shortness of breath today. ?Patient reports that hematuria has completely resolved.  Denies any pain today. ?He is accompanied by his wife. ? ?#History of cutaneous psoriasis, mostly on his right hand, currently on weekly methotrexate 12.5 mg with good symptom control. ?#NGS: [employer] [facility] PD L1 TPS 1% ? ?# patient was referred to ENT for evaluation of headache and sinus pressure.  He had a ENT evaluation and feels that patient does not have sinusitis.  [name] recommended MRI brain to rule out PRES syndrome.  Axitinib has been held ?MRI brain was done and was negative.  ? ?#09/2019 off axitinib and Keytruda since the beginning of May due to transaminitis. ?Patient was seen and evaluated by gastroenterology Dr. AVicente Males Work-up was negative. Ultrasound liver vascular Doppler is negative. Recommend observation. ?#11/24/2019, resumed on Keytruda every 3 weeks ?#01/05/2020, axitinib was resumed 3 mg ?#01/20/2020, axitinib was discontinued due to recurrence of transaminitis ? KBeryle Flockwas temporarily held. ?#03/16/2020, resumed on Keytruda every 3 weeks. ?09/29/2020 CT scan showed no evidence of local recurrence or new/progressive disease  in the chest abdomen or  pelvis.  Tiny nonspecific lung nodules are unchanged.  No new lung nodules. ?May 2022, left knee replacement ?01/14/2021 CT chest abdomen pelvis showed stable exam, no new or progressive metastatic disease within chest abdomen pelvis. Stable lung nodules. Aortic atherosclerosis.  ?Sept 2022 right knee replacement ?04/15/2021, CT chest abdomen pelvis with contrast showed stable examination.  No evidence of recurrent or metastatic carcinoma within the chest abdomen pelvis. ?INTERVAL HISTORY ?Matthew Woodard is a 56 y.o. male who has above history reviewed by me today presents for follow up visit for management of metastatic RCC. ?Patient reports feeling anxious, and depressed.  During the interval, his father passed away.  Also he lost his cat. ?Ongoing diarrhea, multiple episodes per day.  He has used Imodium sometimes with minimal help. ?Sleep disturbance, due to anxiety. ? ?Review of Systems  ?Constitutional:  Negative for appetite change, chills, fatigue, fever and unexpected weight change.  ?HENT:   Negative for hearing loss and voice change.   ?Eyes:  Negative for eye problems and icterus.  ?Respiratory:  Negative for chest tightness, cough and shortness of breath.   ?Cardiovascular:  Negative for chest pain and leg swelling.  ?Gastrointestinal:  Positive for diarrhea. Negative for abdominal distention, abdominal pain, blood in stool and nausea.  ?Endocrine: Negative for hot flashes.  ?Genitourinary:  Negative for difficulty urinating, dysuria and frequency.   ?Musculoskeletal:  Positive for arthralgias.  ?Skin:  Negative for itching and rash.  ?Neurological:  Negative for extremity weakness, headaches, light-headedness and numbness.  ?Hematological:  Negative for adenopathy. Does not bruise/bleed easily.  ?Psychiatric/Behavioral:  Negative for confusion.   ? ?MEDICAL HISTORY:  ?Past Medical History:  ?Diagnosis Date  ? Arthritis   ? hands, left knee  ? Cancer [facility])   ? Diabetes mellitus  without complication ([place])   ? Eczema 01/08/2017  ? GERD (gastroesophageal reflux disease)   ? History of kidney cancer   ? Hyperlipidemia LDL goal <100 11/02/2014  ? Hypertension   ? Kidney stone   ? YEAR H/O HEMATURIA  ? Psoriasis   ? Wears dentures   ? full upper, partial lower.  Has, does not wear  ? ? ?SURGICAL HISTORY: ?Past Surgical History:  ?Procedure Laterality Date  ? COLONOSCOPY WITH PROPOFOL N/A 02/23/2017  ? Procedure: COLONOSCOPY WITH PROPOFOL;  Surgeon: [name], MD;  Location: [place];  Service: Endoscopy;  Laterality: N/A;  Diabetic - oral meds  ? CYST EXCISION  Aug. 2016  ? on back  ? CYSTOSCOPY W/ RETROGRADES Right 06/28/2018  ? Procedure: CYSTOSCOPY WITH RETROGRADE PYELOGRAM;  Surgeon: Billey Co, MD;  Location: [facility];  Service: Urology;  Laterality: Right;  ? CYSTOSCOPY W/ URETERAL STENT REMOVAL Right 07/22/2018  ? Procedure: CYSTOSCOPY WITH STENT REMOVAL;  Surgeon: Billey Co, MD;  Location: [facility];  Service: Urology;  Laterality: Right;  ? CYSTOSCOPY WITH BIOPSY Right 06/28/2018  ? Procedure: CYSTOSCOPY WITH RENAL BIOPSY;  Surgeon: Billey Co, MD;  Location: [facility];  Service: Urology;  Laterality: Right;  ? CYSTOSCOPY WITH URETEROSCOPY AND STENT PLACEMENT Right 06/28/2018  ? Procedure: CYSTOSCOPY WITH URETEROSCOPY AND STENT PLACEMENT;  Surgeon: Billey Co, MD;  Location: [facility];  Service: Urology;  Laterality: Right;  ? FULGURATION OF BLADDER TUMOR N/A 06/28/2018  ? Procedure: CYSTOSCOPY BLADDER BIOPSY, FULGERATION OF BLADDER;  Surgeon: Billey Co, MD;  Location: [facility];  Service: Urology;  Laterality: N/A;  ? KNEE SURGERY Left   ? LAPAROSCOPIC NEPHRECTOMY, HAND ASSISTED Right  07/22/2018  ? Procedure: HAND ASSISTED LAPAROSCOPIC NEPHRECTOMY;  Surgeon: Billey Co, MD;  Location: [facility];  Service: Urology;  Laterality: Right;  ? NASAL SINUS SURGERY  03/15  ? POLYPECTOMY N/A 02/23/2017  ? Procedure: POLYPECTOMY;  Surgeon: [name], MD;   Location: [place];  Service: Endoscopy;  Laterality: N/A;  ? tooth abstraction    ? TRANSURETHRAL RESECTION OF BLADDER TUMOR N/A 05/12/2019  ? Procedure: TRANSURETHRAL RESECTION OF BLADDER TUMOR (T

## 2021-08-17 NOTE — Progress Notes (Signed)
Patient here for follow up. Patient complains of diarrhea and nausea. ?

## 2021-08-18 ENCOUNTER — Telehealth: Payer: Self-pay

## 2021-08-18 NOTE — Telephone Encounter (Signed)
-----   Message from Earlie Server, MD sent at 08/17/2021 11:05 PM EDT ----- ?Please refer patient to gastroenterology Dr.Wohl -esophagitis. ?We discussed during his visit. ? ?

## 2021-08-18 NOTE — Telephone Encounter (Signed)
Referral to Rheumatology faxed with demographics, insurance and last encounter note.  ?

## 2021-08-25 ENCOUNTER — Inpatient Hospital Stay: Payer: 59

## 2021-08-25 ENCOUNTER — Encounter: Payer: Self-pay | Admitting: Medical Oncology

## 2021-08-25 ENCOUNTER — Inpatient Hospital Stay (HOSPITAL_BASED_OUTPATIENT_CLINIC_OR_DEPARTMENT_OTHER): Payer: 59 | Admitting: Medical Oncology

## 2021-08-25 VITALS — BP 156/79 | HR 106 | Temp 98.1°F | Ht 72.0 in | Wt 221.8 lb

## 2021-08-25 DIAGNOSIS — F419 Anxiety disorder, unspecified: Secondary | ICD-10-CM

## 2021-08-25 DIAGNOSIS — E876 Hypokalemia: Secondary | ICD-10-CM

## 2021-08-25 DIAGNOSIS — R739 Hyperglycemia, unspecified: Secondary | ICD-10-CM

## 2021-08-25 DIAGNOSIS — R000 Tachycardia, unspecified: Secondary | ICD-10-CM | POA: Diagnosis not present

## 2021-08-25 DIAGNOSIS — C641 Malignant neoplasm of right kidney, except renal pelvis: Secondary | ICD-10-CM

## 2021-08-25 DIAGNOSIS — R197 Diarrhea, unspecified: Secondary | ICD-10-CM | POA: Diagnosis not present

## 2021-08-25 DIAGNOSIS — Z5112 Encounter for antineoplastic immunotherapy: Secondary | ICD-10-CM

## 2021-08-25 DIAGNOSIS — R21 Rash and other nonspecific skin eruption: Secondary | ICD-10-CM

## 2021-08-25 DIAGNOSIS — F4321 Adjustment disorder with depressed mood: Secondary | ICD-10-CM

## 2021-08-25 LAB — CBC WITH DIFFERENTIAL/PLATELET
Abs Immature Granulocytes: 0.34 10*3/uL — ABNORMAL HIGH (ref 0.00–0.07)
Basophils Absolute: 0.1 10*3/uL (ref 0.0–0.1)
Basophils Relative: 1 %
Eosinophils Absolute: 0 10*3/uL (ref 0.0–0.5)
Eosinophils Relative: 0 %
HCT: 41.5 % (ref 39.0–52.0)
Hemoglobin: 13.6 g/dL (ref 13.0–17.0)
Immature Granulocytes: 3 %
Lymphocytes Relative: 6 %
Lymphs Abs: 0.7 10*3/uL (ref 0.7–4.0)
MCH: 29.5 pg (ref 26.0–34.0)
MCHC: 32.8 g/dL (ref 30.0–36.0)
MCV: 90 fL (ref 80.0–100.0)
Monocytes Absolute: 0.2 10*3/uL (ref 0.1–1.0)
Monocytes Relative: 2 %
Neutro Abs: 9.4 10*3/uL — ABNORMAL HIGH (ref 1.7–7.7)
Neutrophils Relative %: 88 %
Platelets: 252 10*3/uL (ref 150–400)
RBC: 4.61 MIL/uL (ref 4.22–5.81)
RDW: 13.5 % (ref 11.5–15.5)
WBC: 10.6 10*3/uL — ABNORMAL HIGH (ref 4.0–10.5)
nRBC: 0 % (ref 0.0–0.2)

## 2021-08-25 LAB — COMPREHENSIVE METABOLIC PANEL
ALT: 18 U/L (ref 0–44)
AST: 27 U/L (ref 15–41)
Albumin: 3.8 g/dL (ref 3.5–5.0)
Alkaline Phosphatase: 53 U/L (ref 38–126)
Anion gap: 8 (ref 5–15)
BUN: 28 mg/dL — ABNORMAL HIGH (ref 6–20)
CO2: 24 mmol/L (ref 22–32)
Calcium: 8.6 mg/dL — ABNORMAL LOW (ref 8.9–10.3)
Chloride: 100 mmol/L (ref 98–111)
Creatinine, Ser: 1.1 mg/dL (ref 0.61–1.24)
GFR, Estimated: >60 mL/min (ref >60)
Glucose, Bld: 367 mg/dL — ABNORMAL HIGH (ref 70–99)
Potassium: 4.4 mmol/L (ref 3.5–5.1)
Sodium: 132 mmol/L — ABNORMAL LOW (ref 135–145)
Total Bilirubin: 0.6 mg/dL (ref 0.3–1.2)
Total Protein: 6.7 g/dL (ref 6.5–8.1)

## 2021-08-25 MED ORDER — SODIUM CHLORIDE 0.9 % IV SOLN
INTRAVENOUS | Status: AC
  Filled 2021-08-25 (×2): qty 250

## 2021-08-25 MED ORDER — PREDNISONE 10 MG PO TABS: ORAL_TABLET | ORAL | 0 refills | Status: DC

## 2021-08-25 NOTE — Progress Notes (Signed)
?Hematology/Oncology Progress note ?Telephone:(336) [phone] [phone] ?  ? ? ? ?Patient Care Team: ?Danae Orleans, MD as PCP - General (Internal Medicine) ?[name], [name], MD as Consulting Physician (Dermatology) ?Earlie Server, MD as Consulting Physician (Oncology) ? ?REFERRING PROVIDER: ?Danae Orleans, MD  ?CHIEF COMPLAINTS/REASON FOR VISIT:  ?Follow-up of kidney cancer ? ?HISTORY OF PRESENTING ILLNESS:  ? ?Matthew Woodard is a  56 y.o.  male with PMH listed below was seen in consultation at the request of  Danae Orleans, MD  for evaluation of kidney cancer ?Patient's cancer history dated back to February 2020 when he developed gross hematuria with small clots and right-sided flank pain.  Patient had a CT urogram done which showed right 4 cm central enhancing renal mass with invasion into the collecting system.  X-ray of chest was performed to see complete staging which showed no concerning findings. ?06/28/2018 patient underwent a cystoscopy, bladder biopsy and a right retrograde pyelogram with intraoperative interpretation, right diagnostic ureteroscopy, right renal pelvis biopsy, right ureteral stent placement.  biopsy showed atypical cell clusters, with extensive crush artifact, nondiagnostic.   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  Surgical margins negative for tumor.pT3a,Nx ?Patient has been on surveillance after surgery. ?10/28/2018 CT abdomen pelvis with contrast showed minimal fluid and or postoperative changes within the right renal fossa.  No evidence of metastatic disease or recurrence.  Patient has left kidney lesion previously characterized as nonenhancing cyst by multi phasic contrast-enhanced CT. ?Patient reports an episode of gross hematuria in early December 2020, patient is due for 37-month surveillance CT scan. ?04/30/2019 CT abdomen pelvis with contrast showed high attenuation mass at the right uretero vesicle junction, with extension into the bladder.  Bilateral pulmonary nodules, most indicated for metastatic disease. ?  ?Patient underwent cystoscopy and transurethral resection of irregular bladder tumor 3 cm.  Mass appeared to emanate from the right ureteral orifice.  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 ?Patient was referred to me for further work-up and management. ? ?#Staging chest CT with contrast showed numerous bilateral pulmonary nodules.  Compatible with metastatic disease. ?Bone scan showed no osseous metastatic disease. ? ? ?#History of tobacco abuse, former smoker.  Quit in 2015.  He denies any hemoptysis, chest pain, shortness of breath today. ?Patient reports that hematuria has completely resolved.  Denies any pain today. ?He is accompanied by his wife. ? ?#History of cutaneous psoriasis, mostly on his right hand, currently on weekly methotrexate 12.5 mg with good symptom control. ?#NGS: [employer] [facility] PD L1 TPS 1% ? ?# patient was referred to ENT for evaluation of headache and sinus pressure.  He had a ENT evaluation and feels that patient does not have sinusitis.  [name] recommended MRI brain to rule out PRES syndrome.  Axitinib has been held ?MRI brain was done and was negative.  ? ?#09/2019 off axitinib and Keytruda since the beginning of May due to transaminitis. ?Patient was seen and evaluated by gastroenterology Dr. AVicente Males Work-up was negative. Ultrasound liver vascular Doppler is negative. Recommend observation. ?#11/24/2019, resumed on Keytruda every 3 weeks ?#01/05/2020, axitinib was resumed 3 mg ?#01/20/2020, axitinib was discontinued due to recurrence of transaminitis ? KBeryle Flockwas temporarily held. ?#03/16/2020, resumed on Keytruda every 3 weeks. ?09/29/2020 CT scan showed no evidence of local recurrence or new/progressive disease  in the chest abdomen or  pelvis.  Tiny nonspecific lung nodules are unchanged.  No new lung nodules. ?May 2022, left knee replacement ?01/14/2021 CT chest abdomen pelvis showed stable exam, no new or progressive metastatic disease within chest abdomen pelvis. Stable lung nodules. Aortic atherosclerosis.  ?Sept 2022 right knee replacement ?04/15/2021, CT chest abdomen pelvis with contrast showed stable examination.  No evidence of recurrent or metastatic carcinoma within the chest abdomen pelvis. ?INTERVAL HISTORY ?Matthew Woodard is a 56 y.o. male who has above history reviewed by me today presents for follow up visit for management of metastatic RCC. ? ? ?Was last seen on 08/17/2021 by Dr. Tasia Catchings at which time his Beryle Flock was held due to diarrhea. Lomotil was added 4 times per day Prn as well as prednisone 40 mg once daily for a week. He was also experiencing some grief from the loss of his father and cat. Xanax was added PRN. Today he reports that his diarrhea has resolved- still takes Lomotil 1-2 times per day to help reoccurrence. Also taking the prednisone which he finishes tomorrow. He feels much less weak. Mentally he is feeling well. He states that he has taken the xanax PRN but does not want a refill. He got a puppy which has really helped him feel better.   ? ?Review of Systems  ?Constitutional:  Negative for appetite change, chills, fatigue, fever and unexpected weight change.  ?HENT:   Negative for hearing loss and voice change.   ?Eyes:  Negative for eye problems and icterus.  ?Respiratory:  Negative for chest tightness, cough and shortness of breath.   ?Cardiovascular:  Negative for chest pain and leg swelling.  ?Gastrointestinal:  Positive for diarrhea. Negative for abdominal distention, abdominal pain, blood in stool and nausea.  ?Endocrine: Negative for hot flashes.  ?Genitourinary:  Negative for difficulty urinating, dysuria and frequency.   ?Musculoskeletal:  Positive for arthralgias.  ?Skin:   Negative for itching and rash.  ?Neurological:  Negative for extremity weakness, headaches, light-headedness and numbness.  ?Hematological:  Negative for adenopathy. Does not bruise/bleed easily.  ?Psychiatric/Behavioral:  Negative for confusion.   ? ?MEDICAL HISTORY:  ?Past Medical History:  ?Diagnosis Date  ? Arthritis   ? hands, left knee  ? Cancer [facility])   ? Diabetes mellitus without complication ([place])   ? Eczema 01/08/2017  ? GERD (gastroesophageal reflux disease)   ? History of kidney cancer   ? Hyperlipidemia LDL goal <100 11/02/2014  ? Hypertension   ? Kidney stone   ? YEAR H/O HEMATURIA  ? Psoriasis   ? Wears dentures   ? full upper, partial lower.  Has, does not wear  ? ? ?SURGICAL HISTORY: ?Past Surgical History:  ?Procedure Laterality Date  ? COLONOSCOPY WITH PROPOFOL N/A 02/23/2017  ? Procedure: COLONOSCOPY WITH PROPOFOL;  Surgeon: [name], MD;  Location: [place];  Service: Endoscopy;  Laterality: N/A;  Diabetic - oral meds  ? CYST EXCISION  Aug. 2016  ? on back  ? CYSTOSCOPY W/ RETROGRADES Right 06/28/2018  ? Procedure: CYSTOSCOPY WITH RETROGRADE PYELOGRAM;  Surgeon: Billey Co, MD;  Location: [facility];  Service: Urology;  Laterality: Right;  ? CYSTOSCOPY W/ URETERAL STENT REMOVAL Right 07/22/2018  ? Procedure: CYSTOSCOPY WITH STENT REMOVAL;  Surgeon: Billey Co, MD;  Location: [facility];  Service: Urology;  Laterality: Right;  ? CYSTOSCOPY WITH BIOPSY Right 06/28/2018  ? Procedure: CYSTOSCOPY WITH RENAL BIOPSY;  Surgeon: Billey Co, MD;  Location: [facility];  Service: Urology;  Laterality: Right;  ?  CYSTOSCOPY WITH URETEROSCOPY AND STENT PLACEMENT Right 06/28/2018  ? Procedure: CYSTOSCOPY WITH URETEROSCOPY AND STENT PLACEMENT;  Surgeon: Billey Co, MD;  Location: [facility];  Service: Urology;  Laterality: Right;  ? FULGURATION OF BLADDER TUMOR N/A 06/28/2018  ? Procedure: CYSTOSCOPY BLADDER BIOPSY, FULGERATION OF BLADDER;  Surgeon: Billey Co, MD;  Location: [facility];  Service: Urology;  Laterality: N/A;  ? KNEE SURGERY Left   ? LAPAROSCOPIC NEPHRECTOMY, HAND ASSISTED Right 07/22/2018  ? Procedure: HAND ASSISTED LAPAROSCOPIC NEPHRECTOMY;  Surgeon: Billey Co, MD;  Location:

## 2021-08-31 ENCOUNTER — Encounter: Payer: Self-pay | Admitting: Medical Oncology

## 2021-08-31 ENCOUNTER — Inpatient Hospital Stay (HOSPITAL_BASED_OUTPATIENT_CLINIC_OR_DEPARTMENT_OTHER): Payer: 59 | Admitting: Medical Oncology

## 2021-08-31 ENCOUNTER — Inpatient Hospital Stay: Payer: 59

## 2021-08-31 VITALS — BP 111/84 | HR 113 | Temp 97.2°F | Resp 16 | Ht 72.0 in | Wt 215.0 lb

## 2021-08-31 DIAGNOSIS — E876 Hypokalemia: Secondary | ICD-10-CM | POA: Diagnosis not present

## 2021-08-31 DIAGNOSIS — Z5112 Encounter for antineoplastic immunotherapy: Secondary | ICD-10-CM

## 2021-08-31 DIAGNOSIS — C641 Malignant neoplasm of right kidney, except renal pelvis: Secondary | ICD-10-CM | POA: Diagnosis not present

## 2021-08-31 DIAGNOSIS — R197 Diarrhea, unspecified: Secondary | ICD-10-CM | POA: Diagnosis not present

## 2021-08-31 DIAGNOSIS — E860 Dehydration: Secondary | ICD-10-CM

## 2021-08-31 DIAGNOSIS — R000 Tachycardia, unspecified: Secondary | ICD-10-CM | POA: Diagnosis not present

## 2021-08-31 DIAGNOSIS — R21 Rash and other nonspecific skin eruption: Secondary | ICD-10-CM

## 2021-08-31 DIAGNOSIS — F4321 Adjustment disorder with depressed mood: Secondary | ICD-10-CM

## 2021-08-31 DIAGNOSIS — F419 Anxiety disorder, unspecified: Secondary | ICD-10-CM

## 2021-08-31 LAB — COMPREHENSIVE METABOLIC PANEL
ALT: 16 U/L (ref 0–44)
AST: 16 U/L (ref 15–41)
Albumin: 3.9 g/dL (ref 3.5–5.0)
Alkaline Phosphatase: 64 U/L (ref 38–126)
Anion gap: 7 (ref 5–15)
BUN: 23 mg/dL — ABNORMAL HIGH (ref 6–20)
CO2: 27 mmol/L (ref 22–32)
Calcium: 8.9 mg/dL (ref 8.9–10.3)
Chloride: 101 mmol/L (ref 98–111)
Creatinine, Ser: 1.13 mg/dL (ref 0.61–1.24)
GFR, Estimated: >60 mL/min (ref >60)
Glucose, Bld: 368 mg/dL — ABNORMAL HIGH (ref 70–99)
Potassium: 5.1 mmol/L (ref 3.5–5.1)
Sodium: 135 mmol/L (ref 135–145)
Total Bilirubin: 1.1 mg/dL (ref 0.3–1.2)
Total Protein: 6.7 g/dL (ref 6.5–8.1)

## 2021-08-31 LAB — CBC WITH DIFFERENTIAL/PLATELET
Abs Immature Granulocytes: 0.2 10*3/uL — ABNORMAL HIGH (ref 0.00–0.07)
Basophils Absolute: 0.1 10*3/uL (ref 0.0–0.1)
Basophils Relative: 1 %
Eosinophils Absolute: 0 10*3/uL (ref 0.0–0.5)
Eosinophils Relative: 0 %
HCT: 43.3 % (ref 39.0–52.0)
Hemoglobin: 14.2 g/dL (ref 13.0–17.0)
Immature Granulocytes: 2 %
Lymphocytes Relative: 6 %
Lymphs Abs: 0.7 10*3/uL (ref 0.7–4.0)
MCH: 30.1 pg (ref 26.0–34.0)
MCHC: 32.8 g/dL (ref 30.0–36.0)
MCV: 91.7 fL (ref 80.0–100.0)
Monocytes Absolute: 0.3 10*3/uL (ref 0.1–1.0)
Monocytes Relative: 3 %
Neutro Abs: 11.7 10*3/uL — ABNORMAL HIGH (ref 1.7–7.7)
Neutrophils Relative %: 88 %
Platelets: 234 10*3/uL (ref 150–400)
RBC: 4.72 MIL/uL (ref 4.22–5.81)
RDW: 13.6 % (ref 11.5–15.5)
WBC: 13.1 10*3/uL — ABNORMAL HIGH (ref 4.0–10.5)
nRBC: 0 % (ref 0.0–0.2)

## 2021-08-31 MED ORDER — SODIUM CHLORIDE 0.9 % IV SOLN
Freq: Once | INTRAVENOUS | Status: AC
  Filled 2021-08-31: qty 250

## 2021-08-31 NOTE — Progress Notes (Addendum)
?Hematology/Oncology Progress note ?[phone] [phone] ?  ? ? ? ?Patient Care Team: ?Danae Orleans, MD as PCP - General (Internal Medicine) ?[name], [name], MD as Consulting Physician (Dermatology) ?Earlie Server, MD as Consulting Physician (Oncology) ? ?REFERRING PROVIDER: ?Danae Orleans, MD  ?CHIEF COMPLAINTS/REASON FOR VISIT:  ?Follow-up of kidney cancer ? ?HISTORY OF PRESENTING ILLNESS:  ? ?Matthew Woodard is a  56 y.o.  male with PMH listed below was seen in consultation at the request of  Danae Orleans, MD  for evaluation of kidney cancer ?Patient's cancer history dated back to February 2020 when he developed gross hematuria with small clots and right-sided flank pain.  Patient had a CT urogram done which showed right 4 cm central enhancing renal mass with invasion into the collecting system.  X-ray of chest was performed to see complete staging which showed no concerning findings. ?06/28/2018 patient underwent a cystoscopy, bladder biopsy and a right retrograde pyelogram with intraoperative interpretation, right diagnostic ureteroscopy, right renal pelvis biopsy, right ureteral stent placement.  biopsy showed atypical cell clusters, with extensive crush artifact, nondiagnostic.   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  Surgical margins negative for tumor.pT3a,Nx ?Patient has been on surveillance after surgery. ?10/28/2018 CT abdomen pelvis with contrast showed minimal fluid and or postoperative changes within the right renal fossa.  No evidence of metastatic disease or recurrence.  Patient has left kidney lesion previously characterized as nonenhancing cyst by multi phasic contrast-enhanced CT. ?Patient reports an episode of gross hematuria in early December 2020, patient is due for 58-month surveillance CT scan. ?04/30/2019 CT abdomen pelvis with contrast showed high attenuation mass at the right uretero vesicle junction, with extension into the bladder.  Bilateral pulmonary nodules, most indicated for metastatic disease. ?  ?Patient underwent cystoscopy and transurethral resection of irregular bladder tumor 3 cm.  Mass appeared to emanate from the right ureteral orifice.  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 ?Patient was referred to me for further work-up and management. ? ?#Staging chest CT with contrast showed numerous bilateral pulmonary nodules.  Compatible with metastatic disease. ?Bone scan showed no osseous metastatic disease. ? ? ?#History of tobacco abuse, former smoker.  Quit in 2015.  He denies any hemoptysis, chest pain, shortness of breath today. ?Patient reports that hematuria has completely resolved.  Denies any pain today. ?He is accompanied by his wife. ? ?#History of cutaneous psoriasis, mostly on his right hand, currently on weekly methotrexate 12.5 mg with good symptom control. ?#NGS: [facility] medicine PD L1 TPS 1% ? ?# patient was referred to ENT for evaluation of headache and sinus pressure.  He had a ENT evaluation and feels that patient does not have sinusitis.  [name] recommended MRI brain to rule out PRES syndrome.  Axitinib has been held ?MRI brain was done and was negative.  ? ?#09/2019 off axitinib and Keytruda since the beginning of May due to transaminitis. ?Patient was seen and evaluated by gastroenterology Dr. AVicente Males Work-up was negative. Ultrasound liver vascular Doppler is negative. Recommend observation. ?#11/24/2019, resumed on Keytruda every 3 weeks ?#01/05/2020, axitinib was resumed 3 mg ?#01/20/2020, axitinib was discontinued due to recurrence of transaminitis ? KBeryle Flockwas temporarily held. ?#03/16/2020, resumed on Keytruda every 3 weeks. ?09/29/2020 CT scan showed no evidence of local recurrence or new/progressive disease  in the chest abdomen or  pelvis.  Tiny nonspecific lung nodules are unchanged.  No new lung nodules. ?May 2022, left knee replacement ?01/14/2021 CT chest abdomen pelvis showed stable exam, no new or progressive metastatic disease within chest abdomen pelvis. Stable lung nodules. Aortic atherosclerosis.  ?Sept 2022 right knee replacement ?04/15/2021, CT chest abdomen pelvis with contrast showed stable examination.  No evidence of recurrent or metastatic carcinoma within the chest abdomen pelvis. ?08/17/2021- Keytruda held due to diarrhea ? ?INTERVAL HISTORY ?Matthew Woodard is a 56 y.o. male who has above history reviewed by me today presents for follow up visit for management of metastatic RCC. ? ? ?Was last seen on 08/17/2021 by Dr. Tasia Catchings at which time his Beryle Flock was held due to diarrhea. Lomotil was added 4 times per day Prn as well as prednisone 40 mg once daily for a week. He was also experiencing some grief from the loss of his father and cat. Xanax was added PRN. Today he reports that he is feeling much more like himself. Diarrhea has resolved with his BRAT diet and 1-3 Lomotils per day. He is eating well but weight is down from 221 to 215. He feels like being off of the [facility] for this break has been nice as his psoriasis has been under better control. Fatigue has improved. Mood is good and he is now only using the xanax very sparingly if needed.  ? ?Review of Systems  ?Constitutional:  Negative for appetite change, chills, fatigue, fever and unexpected weight change.  ?HENT:   Negative for hearing loss and voice change.   ?Eyes:  Negative for eye problems and icterus.  ?Respiratory:  Negative for chest tightness, cough and shortness of breath.   ?Cardiovascular:  Negative for chest pain and leg swelling.  ?Gastrointestinal:  Negative for abdominal distention, abdominal pain, blood in stool, diarrhea and nausea.  ?Endocrine: Negative for hot flashes.  ?Genitourinary:  Negative for difficulty urinating,  dysuria and frequency.   ?Musculoskeletal:  Negative for arthralgias.  ?Skin:  Negative for itching and rash.  ?Neurological:  Negative for extremity weakness, headaches, light-headedness and numbness.  ?Hematological:  Negative for adenopathy. Does not bruise/bleed easily.  ?Psychiatric/Behavioral:  Negative for confusion.   ? ?MEDICAL HISTORY:  ?Past Medical History:  ?Diagnosis Date  ? Arthritis   ? hands, left knee  ? Cancer [facility])   ? Diabetes mellitus without complication ([place])   ? Eczema 01/08/2017  ? GERD (gastroesophageal reflux disease)   ? History of kidney cancer   ? Hyperlipidemia LDL goal <100 11/02/2014  ? Hypertension   ? Kidney stone   ? YEAR H/O HEMATURIA  ? Psoriasis   ? Wears dentures   ? full upper, partial lower.  Has, does not wear  ? ? ?SURGICAL HISTORY: ?Past Surgical History:  ?Procedure Laterality Date  ? COLONOSCOPY WITH PROPOFOL N/A 02/23/2017  ? Procedure: COLONOSCOPY WITH PROPOFOL;  Surgeon: [name], MD;  Location: [place];  Service: Endoscopy;  Laterality: N/A;  Diabetic - oral meds  ? CYST EXCISION  Aug. 2016  ? on back  ? CYSTOSCOPY W/ RETROGRADES Right 06/28/2018  ? Procedure: CYSTOSCOPY WITH RETROGRADE PYELOGRAM;  Surgeon: Billey Co, MD;  Location: [facility];  Service: Urology;  Laterality: Right;  ? CYSTOSCOPY W/ URETERAL STENT REMOVAL Right 07/22/2018  ? Procedure: CYSTOSCOPY WITH STENT REMOVAL;  Surgeon: Billey Co, MD;  Location: [facility];  Service: Urology;  Laterality: Right;  ? CYSTOSCOPY WITH BIOPSY Right 06/28/2018  ? Procedure: CYSTOSCOPY WITH RENAL  BIOPSY;  Surgeon: Billey Co, MD;  Location: [facility];  Service: Urology;  Laterality: Right;  ? CYSTOSCOPY WITH URETEROSCOPY AND STENT PLACEMENT Right 06/28/2018  ? Procedure: CYSTOSCOPY WITH URETEROSCOPY AND STENT PLACEMENT;  Surgeon: Billey Co, MD;  Location: [facility];  Service: Urology;  Laterality: Right;  ? FULGURATION OF BLADDER TUMOR N/A 06/28/2018  ? Procedure: CYSTOSCOPY BLADDER  BIOPSY, FULGERATION OF BLADDER;  Surgeon: Billey Co, MD;  Location: [facility];  Service: Urology;  Laterality: N/A;  ? KNEE SURGERY Left   ? LAPAROSCOPIC NEPHRECTOMY, HAND ASSISTED Right 07/22/2018  ? Procedure: H

## 2021-09-05 NOTE — Telephone Encounter (Signed)
Pt scheduled for initial visit with GI on 10/03/21. ?

## 2021-09-07 ENCOUNTER — Inpatient Hospital Stay: Payer: 59

## 2021-09-07 ENCOUNTER — Inpatient Hospital Stay (HOSPITAL_BASED_OUTPATIENT_CLINIC_OR_DEPARTMENT_OTHER): Payer: 59 | Admitting: Oncology

## 2021-09-07 ENCOUNTER — Encounter: Payer: Self-pay | Admitting: Oncology

## 2021-09-07 VITALS — BP 129/81 | HR 108 | Temp 96.3°F | Resp 18 | Wt 210.6 lb

## 2021-09-07 DIAGNOSIS — T451X5A Adverse effect of antineoplastic and immunosuppressive drugs, initial encounter: Secondary | ICD-10-CM

## 2021-09-07 DIAGNOSIS — Z5112 Encounter for antineoplastic immunotherapy: Secondary | ICD-10-CM

## 2021-09-07 DIAGNOSIS — K521 Toxic gastroenteritis and colitis: Secondary | ICD-10-CM

## 2021-09-07 DIAGNOSIS — F419 Anxiety disorder, unspecified: Secondary | ICD-10-CM

## 2021-09-07 DIAGNOSIS — C641 Malignant neoplasm of right kidney, except renal pelvis: Secondary | ICD-10-CM

## 2021-09-07 DIAGNOSIS — R000 Tachycardia, unspecified: Secondary | ICD-10-CM | POA: Diagnosis not present

## 2021-09-07 DIAGNOSIS — R739 Hyperglycemia, unspecified: Secondary | ICD-10-CM | POA: Diagnosis not present

## 2021-09-07 DIAGNOSIS — R21 Rash and other nonspecific skin eruption: Secondary | ICD-10-CM

## 2021-09-07 LAB — CBC WITH DIFFERENTIAL/PLATELET
Abs Immature Granulocytes: 0.06 10*3/uL (ref 0.00–0.07)
Basophils Absolute: 0 10*3/uL (ref 0.0–0.1)
Basophils Relative: 0 %
Eosinophils Absolute: 0 10*3/uL (ref 0.0–0.5)
Eosinophils Relative: 1 %
HCT: 42.7 % (ref 39.0–52.0)
Hemoglobin: 14.2 g/dL (ref 13.0–17.0)
Immature Granulocytes: 1 %
Lymphocytes Relative: 18 %
Lymphs Abs: 1.4 10*3/uL (ref 0.7–4.0)
MCH: 30.1 pg (ref 26.0–34.0)
MCHC: 33.3 g/dL (ref 30.0–36.0)
MCV: 90.5 fL (ref 80.0–100.0)
Monocytes Absolute: 0.8 10*3/uL (ref 0.1–1.0)
Monocytes Relative: 10 %
Neutro Abs: 5.3 10*3/uL (ref 1.7–7.7)
Neutrophils Relative %: 70 %
Platelets: 238 10*3/uL (ref 150–400)
RBC: 4.72 MIL/uL (ref 4.22–5.81)
RDW: 13.5 % (ref 11.5–15.5)
WBC: 7.6 10*3/uL (ref 4.0–10.5)
nRBC: 0 % (ref 0.0–0.2)

## 2021-09-07 LAB — COMPREHENSIVE METABOLIC PANEL
ALT: 15 U/L (ref 0–44)
AST: 17 U/L (ref 15–41)
Albumin: 3.5 g/dL (ref 3.5–5.0)
Alkaline Phosphatase: 57 U/L (ref 38–126)
Anion gap: 7 (ref 5–15)
BUN: 16 mg/dL (ref 6–20)
CO2: 23 mmol/L (ref 22–32)
Calcium: 8.6 mg/dL — ABNORMAL LOW (ref 8.9–10.3)
Chloride: 103 mmol/L (ref 98–111)
Creatinine, Ser: 1.04 mg/dL (ref 0.61–1.24)
GFR, Estimated: >60 mL/min (ref >60)
Glucose, Bld: 185 mg/dL — ABNORMAL HIGH (ref 70–99)
Potassium: 4 mmol/L (ref 3.5–5.1)
Sodium: 133 mmol/L — ABNORMAL LOW (ref 135–145)
Total Bilirubin: 0.7 mg/dL (ref 0.3–1.2)
Total Protein: 6.4 g/dL — ABNORMAL LOW (ref 6.5–8.1)

## 2021-09-07 MED ORDER — PREDNISONE 10 MG PO TABS: 10.0000 mg | ORAL_TABLET | Freq: Every day | ORAL | 0 refills | Status: DC

## 2021-09-07 NOTE — Progress Notes (Signed)
Per MD, no IV fluids today.  

## 2021-09-07 NOTE — Progress Notes (Signed)
?Hematology/Oncology Progress note ?Telephone:(336) [phone] [phone] ?  ? ? ? ?Patient Care Team: ?Danae Orleans, MD as PCP - General (Internal Medicine) ?[name], [name], MD as Consulting Physician (Dermatology) ?Earlie Server, MD as Consulting Physician (Oncology) ? ?REFERRING PROVIDER: ?Danae Orleans, MD  ?CHIEF COMPLAINTS/REASON FOR VISIT:  ?Follow-up of kidney cancer ? ?HISTORY OF PRESENTING ILLNESS:  ? ?Matthew Woodard is a  56 y.o.  male with PMH listed below was seen in consultation at the request of  Danae Orleans, MD  for evaluation of kidney cancer ?Patient's cancer history dated back to February 2020 when he developed gross hematuria with small clots and right-sided flank pain.  Patient had a CT urogram done which showed right 4 cm central enhancing renal mass with invasion into the collecting system.  X-ray of chest was performed to see complete staging which showed no concerning findings. ?06/28/2018 patient underwent a cystoscopy, bladder biopsy and a right retrograde pyelogram with intraoperative interpretation, right diagnostic ureteroscopy, right renal pelvis biopsy, right ureteral stent placement.  biopsy showed atypical cell clusters, with extensive crush artifact, nondiagnostic.   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  Surgical margins negative for tumor.pT3a,Nx ?Patient has been on surveillance after surgery. ?10/28/2018 CT abdomen pelvis with contrast showed minimal fluid and or postoperative changes within the right renal fossa.  No evidence of metastatic disease or recurrence.  Patient has left kidney lesion previously characterized as nonenhancing cyst by multi phasic contrast-enhanced CT. ?Patient reports an episode of gross hematuria in early December 2020, patient is due for 66-month surveillance CT scan. ?04/30/2019 CT abdomen pelvis with contrast showed high attenuation mass at the right uretero vesicle junction, with extension into the bladder.  Bilateral pulmonary nodules, most indicated for metastatic disease. ?  ?Patient underwent cystoscopy and transurethral resection of irregular bladder tumor 3 cm.  Mass appeared to emanate from the right ureteral orifice.  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 ?Patient was referred to me for further work-up and management. ? ?#Staging chest CT with contrast showed numerous bilateral pulmonary nodules.  Compatible with metastatic disease. ?Bone scan showed no osseous metastatic disease. ? ? ?#History of tobacco abuse, former smoker.  Quit in 2015.  He denies any hemoptysis, chest pain, shortness of breath today. ?Patient reports that hematuria has completely resolved.  Denies any pain today. ?He is accompanied by his wife. ? ?#History of cutaneous psoriasis, mostly on his right hand, currently on weekly methotrexate 12.5 mg with good symptom control. ?#NGS: [employer] [facility] PD L1 TPS 1% ? ?# patient was referred to ENT for evaluation of headache and sinus pressure.  He had a ENT evaluation and feels that patient does not have sinusitis.  [name] recommended MRI brain to rule out PRES syndrome.  Axitinib has been held ?MRI brain was done and was negative.  ? ?#09/2019 off axitinib and Keytruda since the beginning of May due to transaminitis. ?Patient was seen and evaluated by gastroenterology Dr. AVicente Males Work-up was negative. Ultrasound liver vascular Doppler is negative. Recommend observation. ?#11/24/2019, resumed on Keytruda every 3 weeks ?#01/05/2020, axitinib was resumed 3 mg ?#01/20/2020, axitinib was discontinued due to recurrence of transaminitis ? KBeryle Flockwas temporarily held. ?#03/16/2020, resumed on Keytruda every 3 weeks. ?09/29/2020 CT scan showed no evidence of local recurrence or new/progressive disease  in the chest abdomen or  pelvis.  Tiny nonspecific lung nodules are unchanged.  No new lung nodules. ?May 2022, left knee replacement ?01/14/2021 CT chest abdomen pelvis showed stable exam, no new or progressive metastatic disease within chest abdomen pelvis. Stable lung nodules. Aortic atherosclerosis.  ?Sept 2022 right knee replacement ?04/15/2021, CT chest abdomen pelvis with contrast showed stable examination.  No evidence of recurrent or metastatic carcinoma within the chest abdomen pelvis. ?INTERVAL HISTORY ?Matthew Woodard is a 56 y.o. male who has above history reviewed by me today presents for follow up visit for management of metastatic RCC. ?Patient continues to have intermittent diarrhea episodes.  Average 2 to 3/day. ?Patient was previously on steroid with prednisone 40 mg daily, the following week steroid with quickly taper down due to hyperglycemia. ?Denies any other new complaints. ? ?Review of Systems  ?Constitutional:  Negative for appetite change, chills, fatigue, fever and unexpected weight change.  ?HENT:   Negative for hearing loss and voice change.   ?Eyes:  Negative for eye problems and icterus.  ?Respiratory:  Negative for chest tightness, cough and shortness of breath.   ?Cardiovascular:  Negative for chest pain and leg swelling.  ?Gastrointestinal:  Positive for diarrhea. Negative for abdominal distention, abdominal pain, blood in stool and nausea.  ?Endocrine: Negative for hot flashes.  ?Genitourinary:  Negative for difficulty urinating, dysuria and frequency.   ?Musculoskeletal:  Positive for arthralgias.  ?Skin:  Negative for itching and rash.  ?Neurological:  Negative for extremity weakness, headaches, light-headedness and numbness.  ?Hematological:  Negative for adenopathy. Does not bruise/bleed easily.  ?Psychiatric/Behavioral:  Negative for confusion.   ? ?MEDICAL HISTORY:  ?Past Medical History:  ?Diagnosis Date  ? Arthritis   ? hands, left knee  ? Cancer [facility])   ? Diabetes mellitus  without complication ([place])   ? Eczema 01/08/2017  ? GERD (gastroesophageal reflux disease)   ? History of kidney cancer   ? Hyperlipidemia LDL goal <100 11/02/2014  ? Hypertension   ? Kidney stone   ? YEAR H/O HEMATURIA  ? Psoriasis   ? Wears dentures   ? full upper, partial lower.  Has, does not wear  ? ? ?SURGICAL HISTORY: ?Past Surgical History:  ?Procedure Laterality Date  ? COLONOSCOPY WITH PROPOFOL N/A 02/23/2017  ? Procedure: COLONOSCOPY WITH PROPOFOL;  Surgeon: [name], MD;  Location: [place];  Service: Endoscopy;  Laterality: N/A;  Diabetic - oral meds  ? CYST EXCISION  Aug. 2016  ? on back  ? CYSTOSCOPY W/ RETROGRADES Right 06/28/2018  ? Procedure: CYSTOSCOPY WITH RETROGRADE PYELOGRAM;  Surgeon: Billey Co, MD;  Location: [facility];  Service: Urology;  Laterality: Right;  ? CYSTOSCOPY W/ URETERAL STENT REMOVAL Right 07/22/2018  ? Procedure: CYSTOSCOPY WITH STENT REMOVAL;  Surgeon: Billey Co, MD;  Location: [facility];  Service: Urology;  Laterality: Right;  ? CYSTOSCOPY WITH BIOPSY Right 06/28/2018  ? Procedure: CYSTOSCOPY WITH RENAL BIOPSY;  Surgeon: Billey Co, MD;  Location: [facility];  Service: Urology;  Laterality: Right;  ? CYSTOSCOPY WITH URETEROSCOPY AND STENT PLACEMENT Right 06/28/2018  ? Procedure: CYSTOSCOPY WITH URETEROSCOPY AND STENT PLACEMENT;  Surgeon: Billey Co, MD;  Location: [facility];  Service: Urology;  Laterality: Right;  ? FULGURATION OF BLADDER TUMOR N/A 06/28/2018  ? Procedure: CYSTOSCOPY BLADDER BIOPSY, FULGERATION OF BLADDER;  Surgeon: Billey Co, MD;  Location: [facility];  Service: Urology;  Laterality: N/A;  ? KNEE SURGERY Left   ? LAPAROSCOPIC NEPHRECTOMY, HAND ASSISTED Right 07/22/2018  ?  Procedure: HAND ASSISTED LAPAROSCOPIC NEPHRECTOMY;  Surgeon: Billey Co, MD;  Location: [facility];  Service: Urology;  Laterality: Right;  ? NASAL SINUS SURGERY  03/15  ? POLYPECTOMY N/A 02/23/2017  ? Procedure: POLYPECTOMY;  Surgeon: [name], MD;   Location: [place];  Service: Endoscopy;  Laterality: N/A;  ? tooth abstraction    ? TRANSURETHRAL RESECTION OF BLADDER TUMOR N/A 05/12/2019  ? Procedure: TRANSURETHRAL RESECTION OF BLADDER TUMOR (

## 2021-09-07 NOTE — Progress Notes (Signed)
Nutrition Assessment ? ? ?Reason for Assessment:  ?Patient identified on Malnutrition Screening report for weight loss and poor appetite ? ? ?ASSESSMENT:  ?56 year old male with metastatic renal cell carcinoma.  Past medical history of DM, GERD, HLD, HTN, psoriasis.  Current treatment of Beryle Flock is on hold due to diarrhea.   ? ?Met with patient following MD visit.  No IV fluids today.  Reports that he is still having diarrhea 3-4 times a day.  Hard to remember to take lomotil 4 times per day.  Prednisone was stopped but planning to restart today per patient.  Says that he has been trying to follow a BRAT diet, not sticking to it completely.  Had 1/2 hamburger today from fast food restaurant.  Had butter toast and milk this am.   ? ? ?Medications: metformin, ozempic, lomotil, jardiance, MVI, prilosec, KCL, compazine ? ? ?Labs: Na 133, glucose 185 ? ? ?Anthropometrics:  ? ?Weight is 210 lb 9.6 oz today ?215 lb on 4/19 ?218 lb on 2/28 ?215 lb on 1/17 ? ?4% weight loss in the last 2 months, concerning ? ? ?Estimated Energy Needs ? ?Kcals: 3700-5259 ?Protein: 119-142 g ?Fluid: 2.3 L ? ? ?NUTRITION DIAGNOSIS: Inadequate oral intake related to cancer related treatment side effects (diarrhea) as evidenced by 4% weight loss and decreased intake ? ? ?INTERVENTION:  ?Discussed strategies to help with diarrhea (low fiber, decreased fat, spicy foods) ?Encouraged low sugar electrolyte containing beverages for hydration ?Samples of glucerna shakes and ensure max protein given along with coupons ? ? ?MONITORING, EVALUATION, GOAL: weight trends, intake ? ? ?Next Visit: Thursday, May 25 after MD visit ? ?Naudia Crosley B. Zenia Resides, RD, LDN ?Registered Dietitian ?[phone] ? ? ? ? ? ?

## 2021-09-15 ENCOUNTER — Encounter: Payer: Self-pay | Admitting: Oncology

## 2021-09-15 NOTE — Telephone Encounter (Signed)
Shirlean Mylar, have you received any FMLA forms for pts wife, Osbaldo Mark ?

## 2021-09-21 ENCOUNTER — Telehealth: Payer: Self-pay

## 2021-09-21 NOTE — Telephone Encounter (Signed)
FMLA forms completed for Noam Karaffa (wife) faxed to [employer]. Fax # [phone] ? ?Leave# [id number] ?

## 2021-09-22 ENCOUNTER — Other Ambulatory Visit: Payer: Self-pay | Admitting: *Deleted

## 2021-09-22 MED ORDER — POTASSIUM CHLORIDE CRYS ER 10 MEQ PO TBCR: 10.0000 meq | EXTENDED_RELEASE_TABLET | Freq: Every day | ORAL | 0 refills | Status: DC

## 2021-09-22 NOTE — Telephone Encounter (Signed)
Component Ref Range & Units 2 wk ago ?(09/07/21) 3 wk ago ?(08/31/21) 4 wk ago ?(08/25/21) 1 mo ago ?(08/17/21) 2 mo ago ?(07/12/21) 3 mo ago ?(06/21/21) 3 mo ago ?(05/31/21)  ?Potassium 3.5 - 5.1 mmol/L 4.0  5.1  4.4  3.4 Low       ? ?

## 2021-09-27 ENCOUNTER — Other Ambulatory Visit: Payer: Self-pay

## 2021-09-27 ENCOUNTER — Encounter: Payer: Self-pay | Admitting: Oncology

## 2021-09-27 MED ORDER — PREDNISONE 10 MG PO TABS: 10.0000 mg | ORAL_TABLET | Freq: Every day | ORAL | 0 refills | Status: DC

## 2021-10-02 NOTE — Progress Notes (Signed)
Primary Care Physician: Danae Orleans, MD  Primary Gastroenterologist:  Dr. [name]  Chief Complaint  Patient presents with   Follow-up    HPI: Matthew Woodard is a 56 y.o. male here after seeing [name] for abnormal liver enzymes in 2021 and had a colonoscopy by me back in 2018. 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  The patient was seen by his oncologist for diarrhea and had reported that the diarrhea had greatly improved.  The patient had a CT scan of the abdomen without contrast and without any evidence of metastatic recurrence.  There was diffuse thickening of the esophagus that had been seen on prior examinations thought to be chronic esophagitis.  The patient has been checked for C. Difficile and a GI panel which were both negative.  He has been on metformin and of Ozempic and was reported to not have any changes in his dosing recently.  The patient was started on Lomotil 4 times a day at his last oncology visit. He has been having one bowel movement a day and it is soft.  The patient reports that he is hesitant to back off on the Lomotil because she does not want to have the diarrhea again.  The patient does report that he has had some weight loss but reports that it is likely due to his diarrhea and decreased eating.  The patient denies any dysphagia but did have a CT scan showing thickening of the esophagus.  He denies any black stools or bloody stools   Past Medical History:  Diagnosis Date   Arthritis    hands, left knee   Cancer ([place])    Diabetes mellitus without complication ([place])    Eczema 01/08/2017   GERD (gastroesophageal reflux disease)    History of kidney cancer    Hyperlipidemia LDL goal <100 11/02/2014   Hypertension    Kidney stone    YEAR H/O HEMATURIA   Psoriasis    Wears dentures    full upper, partial lower.  Has, does not wear    Current Outpatient Medications  Medication  Sig Dispense Refill   ALPRAZolam (XANAX) 0.5 MG tablet Take 1 tablet (0.5 mg total) by mouth 2 (two) times daily. 60 tablet 0   aspirin 81 MG EC tablet Take 81 mg by mouth daily with supper.     clobetasol ointment (TEMOVATE) 3.55 % Apply 1 application topically 2 (two) times daily as needed for rash.     cyclobenzaprine (FLEXERIL) 10 MG tablet Take 10 mg by mouth 3 (three) times daily as needed for muscle spasms.     diphenoxylate-atropine (LOMOTIL) 2.5-0.025 MG tablet Take 1 tablet by mouth 4 (four) times daily as needed for diarrhea or loose stools. 60 tablet 1   Glucose Blood (ACCU-CHEK AVIVA PLUS VI) by In Vitro route.     HYDROcodone-acetaminophen (NORCO/VICODIN) 5-325 MG tablet Take 1-2 tablets by mouth every 4 (four) hours as needed for moderate pain. 20 tablet 0   JARDIANCE 10 MG TABS tablet TAKE 1 TABLET BY MOUTH DAILY 30 tablet 2   lisinopril (PRINIVIL,ZESTRIL) 10 MG tablet TAKE 1 TABLET(10 MG) BY MOUTH DAILY 90 tablet 1   metFORMIN (GLUCOPHAGE-XR) 500 MG 24 hr tablet Take 3 pills by mouth daily for 4-5 days, then increase to 4 pills daily (if tolerated) (Patient taking differently: Take 1,000 mg by mouth daily with supper.) 360 tablet 0   Multiple  Vitamin (MULTIVITAMIN) tablet Take 1 tablet by mouth daily with supper.     omeprazole (PRILOSEC) 20 MG capsule Take 20 mg by mouth daily as needed ([place]).     Oxycodone HCl 10 MG TABS Take 10 mg by mouth every 4 (four) hours.     OZEMPIC, 0.25 OR 0.5 MG/DOSE, 2 MG/1.5ML SOPN Inject 1 mg into the skin every Sunday.     potassium chloride (KLOR-CON M) 10 MEQ tablet Take 1 tablet (10 mEq total) by mouth daily. 30 tablet 0   predniSONE (DELTASONE) 10 MG tablet Take 1 tablet (10 mg total) by mouth daily with breakfast. 10 tablet 0   prochlorperazine (COMPAZINE) 10 MG tablet Take 1 tablet (10 mg total) by mouth every 6 (six) hours as needed for nausea or vomiting. 15 tablet 0   rosuvastatin (CRESTOR) 20 MG tablet Take 20 mg by mouth daily with  supper.     traZODone (DESYREL) 50 MG tablet TAKE 1 TABLET(50 MG) BY MOUTH AT BEDTIME 30 tablet 3   No current facility-administered medications for this visit.    Allergies as of 10/03/2021 - Review Complete 09/07/2021  Allergen Reaction Noted   Axitinib  07/29/2020   Glucotrol [glipizide] Other (See Comments) 10/22/2014    ROS:  General: Negative for anorexia, weight loss, fever, chills, fatigue, weakness. ENT: Negative for hoarseness, difficulty swallowing , nasal congestion. CV: Negative for chest pain, angina, palpitations, dyspnea on exertion, peripheral edema.  Respiratory: Negative for dyspnea at rest, dyspnea on exertion, cough, sputum, wheezing.  GI: See history of present illness. GU:  Negative for dysuria, hematuria, urinary incontinence, urinary frequency, nocturnal urination.  Endo: Negative for unusual weight change.    Physical Examination:   BP 132/79 (BP Location: Left Arm, Patient Position: Sitting, Cuff Size: Normal)   Pulse 89   Temp 97.7 F (36.5 C) (Oral)   Ht 6' (1.829 m)   Wt 213 lb (96.6 kg)   BMI 28.89 kg/m   General: Well-nourished, well-developed in no acute distress.  Eyes: No icterus. Conjunctivae pink. Lungs: Clear to auscultation bilaterally. Non-labored. Heart: Regular rate and rhythm, no murmurs rubs or gallops.  Abdomen: Bowel sounds are normal, nontender, nondistended, no hepatosplenomegaly or masses, no abdominal bruits or hernia , no rebound or guarding.   Extremities: No lower extremity edema. No clubbing or deformities. Neuro: Alert and oriented x 3.  Grossly intact. Skin: Warm and dry, no jaundice.   Psych: Alert and cooperative, normal mood and affect.  Labs:    Imaging Studies: No results found.  Assessment and Plan:   Matthew Woodard is a 56 y.o. y/o male who comes in today with a history of diarrhea and abnormal CT scan showing thickening of the lower esophagus.  The patient reports that his diarrhea is under control  with Lomotil but he is fearful that it will come back if he decreases his dose.  The patient will be set up for an EGD to look for the cause of his esophageal wall thickening and a colonoscopy for his diarrhea.  The patient has been explained the plan agrees with it.     [name], MD. Marval Regal    Note: This dictation was prepared with Dragon dictation along with smaller phrase technology. Any transcriptional errors that result from this process are unintentional.

## 2021-10-03 ENCOUNTER — Other Ambulatory Visit: Payer: Self-pay

## 2021-10-03 ENCOUNTER — Encounter: Payer: Self-pay | Admitting: Gastroenterology

## 2021-10-03 ENCOUNTER — Ambulatory Visit (INDEPENDENT_AMBULATORY_CARE_PROVIDER_SITE_OTHER): Payer: 59 | Admitting: Gastroenterology

## 2021-10-03 VITALS — BP 132/79 | HR 89 | Temp 97.7°F | Ht 72.0 in | Wt 213.0 lb

## 2021-10-03 DIAGNOSIS — R197 Diarrhea, unspecified: Secondary | ICD-10-CM

## 2021-10-03 DIAGNOSIS — R933 Abnormal findings on diagnostic imaging of other parts of digestive tract: Secondary | ICD-10-CM | POA: Diagnosis not present

## 2021-10-03 MED ORDER — DIPHENOXYLATE-ATROPINE 2.5-0.025 MG PO TABS: 1.0000 | ORAL_TABLET | Freq: Four times a day (QID) | ORAL | 1 refills | Status: DC | PRN

## 2021-10-03 MED ORDER — PEG 3350-KCL-NA BICARB-NACL 420 G PO SOLR: 4000.0000 mL | Freq: Once | ORAL | 0 refills | Status: AC

## 2021-10-03 NOTE — Addendum Note (Signed)
Addended by: Lurlean Nanny on: 10/03/2021 01:31 PM   Modules accepted: Orders

## 2021-10-04 ENCOUNTER — Encounter: Payer: Self-pay | Admitting: Oncology

## 2021-10-06 ENCOUNTER — Inpatient Hospital Stay: Payer: 59

## 2021-10-06 ENCOUNTER — Inpatient Hospital Stay (HOSPITAL_BASED_OUTPATIENT_CLINIC_OR_DEPARTMENT_OTHER): Payer: 59 | Admitting: Oncology

## 2021-10-06 ENCOUNTER — Inpatient Hospital Stay: Payer: 59 | Attending: Oncology

## 2021-10-06 ENCOUNTER — Encounter: Payer: Self-pay | Admitting: Oncology

## 2021-10-06 VITALS — BP 120/83 | HR 115 | Temp 97.7°F | Wt 211.0 lb

## 2021-10-06 DIAGNOSIS — R197 Diarrhea, unspecified: Secondary | ICD-10-CM | POA: Diagnosis not present

## 2021-10-06 DIAGNOSIS — E876 Hypokalemia: Secondary | ICD-10-CM | POA: Diagnosis not present

## 2021-10-06 DIAGNOSIS — C641 Malignant neoplasm of right kidney, except renal pelvis: Secondary | ICD-10-CM | POA: Insufficient documentation

## 2021-10-06 DIAGNOSIS — C7800 Secondary malignant neoplasm of unspecified lung: Secondary | ICD-10-CM | POA: Diagnosis not present

## 2021-10-06 DIAGNOSIS — K521 Toxic gastroenteritis and colitis: Secondary | ICD-10-CM | POA: Diagnosis not present

## 2021-10-06 DIAGNOSIS — Z5112 Encounter for antineoplastic immunotherapy: Secondary | ICD-10-CM

## 2021-10-06 DIAGNOSIS — T451X5A Adverse effect of antineoplastic and immunosuppressive drugs, initial encounter: Secondary | ICD-10-CM

## 2021-10-06 DIAGNOSIS — Z79899 Other long term (current) drug therapy: Secondary | ICD-10-CM | POA: Diagnosis not present

## 2021-10-06 DIAGNOSIS — F419 Anxiety disorder, unspecified: Secondary | ICD-10-CM | POA: Diagnosis not present

## 2021-10-06 DIAGNOSIS — Z905 Acquired absence of kidney: Secondary | ICD-10-CM | POA: Insufficient documentation

## 2021-10-06 DIAGNOSIS — L409 Psoriasis, unspecified: Secondary | ICD-10-CM | POA: Diagnosis not present

## 2021-10-06 DIAGNOSIS — R21 Rash and other nonspecific skin eruption: Secondary | ICD-10-CM

## 2021-10-06 LAB — T4, FREE: Free T4: 0.85 ng/dL (ref 0.61–1.12)

## 2021-10-06 LAB — TSH: TSH: 0.407 u[IU]/mL (ref 0.350–4.500)

## 2021-10-06 LAB — CBC WITH DIFFERENTIAL/PLATELET
Abs Immature Granulocytes: 0.1 10*3/uL — ABNORMAL HIGH (ref 0.00–0.07)
Basophils Absolute: 0 10*3/uL (ref 0.0–0.1)
Basophils Relative: 0 %
Eosinophils Absolute: 0 10*3/uL (ref 0.0–0.5)
Eosinophils Relative: 0 %
HCT: 41.7 % (ref 39.0–52.0)
Hemoglobin: 14.1 g/dL (ref 13.0–17.0)
Immature Granulocytes: 1 %
Lymphocytes Relative: 14 %
Lymphs Abs: 1.3 10*3/uL (ref 0.7–4.0)
MCH: 30.7 pg (ref 26.0–34.0)
MCHC: 33.8 g/dL (ref 30.0–36.0)
MCV: 90.8 fL (ref 80.0–100.0)
Monocytes Absolute: 0.4 10*3/uL (ref 0.1–1.0)
Monocytes Relative: 5 %
Neutro Abs: 7.4 10*3/uL (ref 1.7–7.7)
Neutrophils Relative %: 80 %
Platelets: 237 10*3/uL (ref 150–400)
RBC: 4.59 MIL/uL (ref 4.22–5.81)
RDW: 13.6 % (ref 11.5–15.5)
WBC: 9.2 10*3/uL (ref 4.0–10.5)
nRBC: 0 % (ref 0.0–0.2)

## 2021-10-06 LAB — COMPREHENSIVE METABOLIC PANEL
ALT: 25 U/L (ref 0–44)
AST: 26 U/L (ref 15–41)
Albumin: 4.1 g/dL (ref 3.5–5.0)
Alkaline Phosphatase: 55 U/L (ref 38–126)
Anion gap: 8 (ref 5–15)
BUN: 21 mg/dL — ABNORMAL HIGH (ref 6–20)
CO2: 25 mmol/L (ref 22–32)
Calcium: 9 mg/dL (ref 8.9–10.3)
Chloride: 101 mmol/L (ref 98–111)
Creatinine, Ser: 1.16 mg/dL (ref 0.61–1.24)
GFR, Estimated: >60 mL/min (ref >60)
Glucose, Bld: 265 mg/dL — ABNORMAL HIGH (ref 70–99)
Potassium: 4.8 mmol/L (ref 3.5–5.1)
Sodium: 134 mmol/L — ABNORMAL LOW (ref 135–145)
Total Bilirubin: 0.7 mg/dL (ref 0.3–1.2)
Total Protein: 6.8 g/dL (ref 6.5–8.1)

## 2021-10-06 MED ORDER — PREDNISONE 10 MG PO TABS
10.0000 mg | ORAL_TABLET | Freq: Every day | ORAL | 0 refills | Status: DC
Start: 2021-10-06 — End: 2021-10-26

## 2021-10-06 NOTE — Progress Notes (Signed)
Nutrition Follow-up:   Patient with metastatic renal cell carcinoma.  Keytruda on hold.  Planning EGD and colonscopy on 6/9.  Met with patient after MD visit.  Patient reports that appetite is little bit better. Diarrhea is better.  Has more soft, loose stools less often.  Usually drinks ensure shake in the morning and eats eggs, toast, grits.  Has liberalized diet more.  Drinking mostly water and gatorade zero.  Prednisone has increased appetite   Medications: lomotil, prednisone   Labs: glucose 265, Na 134, BUN 21, creatinine 1.16  Anthropometrics:   Weight 211 lb on 5/25 210 lb 9.6 oz 4/26 215 lb on 4/19 218 lb on 2/28 215 lb on 1/17  Patient wants to get down below 200 lb.     NUTRITION DIAGNOSIS: Inadequate oral intake improved   INTERVENTION:  Liberalize diet as less diarrhea.  Monitor foods that increase diarrhea.   Continue low shakes for added nutrition Contact information provided  MONITORING, EVALUATION, GOAL: weight trends, intake   NEXT VISIT: no follow-up Patient to call RD if needed  Matthew Woodard B. Zenia Resides, [place], [place] Registered Dietitian [phone]

## 2021-10-07 ENCOUNTER — Encounter: Payer: Self-pay | Admitting: Oncology

## 2021-10-07 NOTE — Progress Notes (Signed)
Hematology/Oncology Progress note [phone] [phone]      Patient Care Team: Danae Orleans, MD as PCP - General (Internal Medicine) [name], [name], MD as Consulting Physician (Dermatology) Earlie Server, MD as Consulting Physician (Oncology)  REFERRING PROVIDER: Danae Orleans, MD  CHIEF COMPLAINTS/REASON FOR VISIT:  Follow-up of kidney cancer  HISTORY OF PRESENTING ILLNESS:   Matthew Woodard is a  56 y.o.  male with PMH listed below was seen in consultation at the request of  Danae Orleans, MD  for evaluation of kidney cancer Patient's cancer history dated back to February 2020 when he developed gross hematuria with small clots and right-sided flank pain.  Patient had a CT urogram done which showed right 4 cm central enhancing renal mass with invasion into the collecting system.  X-ray of chest was performed to see complete staging which showed no concerning findings. 06/28/2018 patient underwent a cystoscopy, bladder biopsy and a right retrograde pyelogram with intraoperative interpretation, right diagnostic ureteroscopy, right renal pelvis biopsy, right ureteral stent placement.  biopsy showed atypical cell clusters, with extensive crush artifact, nondiagnostic.   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  Surgical margins negative for tumor.pT3a,Nx Patient has been on surveillance after surgery. 10/28/2018 CT abdomen pelvis with contrast showed minimal fluid and or postoperative changes within the right renal fossa.  No evidence of metastatic disease or recurrence.  Patient has left kidney lesion previously characterized as nonenhancing cyst by multi phasic contrast-enhanced CT. Patient reports an episode of gross hematuria in early December 2020, patient is due for 12-month surveillance CT scan. 04/30/2019 CT abdomen pelvis with contrast showed high attenuation mass at the right uretero vesicle junction, with extension into the bladder.  Bilateral pulmonary nodules, most indicated for metastatic disease.   Patient underwent cystoscopy and transurethral resection of irregular bladder tumor 3 cm.  Mass appeared to emanate from the right ureteral orifice.  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 Patient was referred to me for further work-up and management.  #Staging chest CT with contrast showed numerous bilateral pulmonary nodules.  Compatible with metastatic disease. Bone scan showed no osseous metastatic disease.   #History of tobacco abuse, former smoker.  Quit in 2015.  He denies any hemoptysis, chest pain, shortness of breath today. Patient reports that hematuria has completely resolved.  Denies any pain today. He is accompanied by his wife.  #History of cutaneous psoriasis, mostly on his right hand, currently on weekly methotrexate 12.5 mg with good symptom control. #NGS: [facility] PD L1 TPS 1%  # patient was referred to ENT for evaluation of headache and sinus pressure.  He had a ENT evaluation and feels that patient does not have sinusitis.  [name] recommended MRI brain to rule out PRES syndrome.  Axitinib has been held MRI brain was done and was negative.   #09/2019 off axitinib and Keytruda since the beginning of May due to transaminitis. Patient was seen and evaluated by gastroenterology Dr. AVicente Males Work-up was negative. Ultrasound liver vascular Doppler is negative. Recommend observation. #11/24/2019, resumed on Keytruda every 3 weeks #01/05/2020, axitinib was resumed 3 mg #01/20/2020, axitinib was discontinued due to recurrence of transaminitis  KBeryle Flockwas temporarily held. #03/16/2020, resumed on Keytruda every 3 weeks. 09/29/2020 CT scan showed no evidence of local recurrence or new/progressive disease  in the chest abdomen or  pelvis.  Tiny nonspecific lung nodules are unchanged.  No new lung nodules. May 2022, left knee replacement 01/14/2021 CT chest abdomen pelvis showed stable exam, no new or progressive metastatic disease within chest abdomen pelvis. Stable lung nodules. Aortic atherosclerosis.  Sept 2022 right knee replacement 04/15/2021, CT chest abdomen pelvis with contrast showed stable examination.  No evidence of recurrent or metastatic carcinoma within the chest abdomen pelvis.  08/02/2021, CT chest abdomen pelvis without contrast, stable examination without evidence of recurrence or metastatic disease.  Mild asymmetric diffuse esophageal wall thickening, similar to prior examination.  Likely chronic esophagitis.  Aortic atherosclerosis.  INTERVAL HISTORY Matthew Woodard is a 56 y.o. male who has above history reviewed by me today presents for follow up visit for management of metastatic RCC. Diarrhea has improved.  He continues using Lomotil as instructed.  On average, he has 2 soft bowel movements daily.  Denies any abdominal pain, nausea vomiting. Patient is currently on prednisone 10 mg daily.   Review of Systems  Constitutional:  Negative for appetite change, chills, fatigue, fever and unexpected weight change.  HENT:   Negative for hearing loss and voice change.   Eyes:  Negative for eye problems and icterus.  Respiratory:  Negative for chest tightness, cough and shortness of breath.   Cardiovascular:  Negative for chest pain and leg swelling.  Gastrointestinal:  Negative for abdominal distention, abdominal pain, blood in stool, diarrhea and nausea.  Endocrine: Negative for hot flashes.  Genitourinary:  Negative for difficulty urinating, dysuria and frequency.   Musculoskeletal:  Positive for arthralgias.  Skin:  Negative for itching and rash.  Neurological:  Negative for extremity weakness, headaches, light-headedness and numbness.  Hematological:  Negative for  adenopathy. Does not bruise/bleed easily.  Psychiatric/Behavioral:  Negative for confusion.    MEDICAL HISTORY:  Past Medical History:  Diagnosis Date   Arthritis    hands, left knee   Cancer ([place])    Diabetes mellitus without complication ([place])    Eczema 01/08/2017   GERD (gastroesophageal reflux disease)    History of kidney cancer    Hyperlipidemia LDL goal <100 11/02/2014   Hypertension    Kidney stone    YEAR H/O HEMATURIA   Psoriasis    Wears dentures    full upper, partial lower.  Has, does not wear    SURGICAL HISTORY: Past Surgical History:  Procedure Laterality Date   COLONOSCOPY WITH PROPOFOL N/A 02/23/2017   Procedure: COLONOSCOPY WITH PROPOFOL;  Surgeon: [name], MD;  Location: [facility];  Service: Endoscopy;  Laterality: N/A;  Diabetic - oral meds   CYST EXCISION  Aug. 2016   on back   CYSTOSCOPY W/ RETROGRADES Right 06/28/2018   Procedure: CYSTOSCOPY WITH RETROGRADE PYELOGRAM;  Surgeon: Billey Co, MD;  Location: [facility];  Service: Urology;  Laterality: Right;   CYSTOSCOPY W/ URETERAL STENT REMOVAL Right 07/22/2018   Procedure: CYSTOSCOPY WITH STENT REMOVAL;  Surgeon: Billey Co, MD;  Location: [facility];  Service: Urology;  Laterality: Right;   CYSTOSCOPY WITH BIOPSY Right 06/28/2018   Procedure: CYSTOSCOPY WITH RENAL BIOPSY;  Surgeon: Billey Co, MD;  Location: [facility];  Service: Urology;  Laterality: Right;   CYSTOSCOPY WITH URETEROSCOPY AND STENT PLACEMENT Right 06/28/2018   Procedure: CYSTOSCOPY WITH URETEROSCOPY AND STENT PLACEMENT;  Surgeon: Billey Co, MD;  Location: [facility];  Service: Urology;  Laterality: Right;   FULGURATION OF BLADDER TUMOR N/A 06/28/2018   Procedure: CYSTOSCOPY BLADDER BIOPSY, FULGERATION OF  BLADDER;  Surgeon: Billey Co, MD;  Location: [facility];  Service: Urology;  Laterality: N/A;   KNEE SURGERY Left    LAPAROSCOPIC NEPHRECTOMY, HAND ASSISTED Right 07/22/2018   Procedure: HAND ASSISTED  LAPAROSCOPIC NEPHRECTOMY;  Surgeon: Billey Co, MD;  Location: [facility];  Service: Urology;  Laterality: Right;   NASAL SINUS SURGERY  03/15   POLYPECTOMY N/A 02/23/2017   Procedure: POLYPECTOMY;  Surgeon: [name], MD;  Location: [facility];  Service: Endoscopy;  Laterality: N/A;   tooth abstraction     TRANSURETHRAL RESECTION OF BLADDER TUMOR N/A 05/12/2019   Procedure: TRANSURETHRAL RESECTION OF BLADDER TUMOR (TURBT);  Surgeon: Billey Co, MD;  Location: [facility];  Service: Urology;  Laterality: N/A;   VARICOCELE EXCISION     XI ROBOTIC ASSISTED VENTRAL HERNIA N/A 11/18/2020   Procedure: XI ROBOTIC ASSISTED VENTRAL HERNIA, incisional;  Surgeon: Jules Husbands, MD;  Location: [facility];  Service: General;  Laterality: N/A;    SOCIAL HISTORY: Social History   Socioeconomic History   Marital status: Married    Spouse name: Alma Friendly   Number of children: Not on file   Years of education: Not on file   Highest education level: Not on file  Occupational History   Not on file  Tobacco Use   Smoking status: Former    Types: Cigarettes    Quit date: 06/15/2013    Years since quitting: 8.3   Smokeless tobacco: Never  Vaping Use   Vaping Use: Never used  Substance and Sexual Activity   Alcohol use: No    Alcohol/week: 0.0 standard drinks   Drug use: No   Sexual activity: Yes  Other Topics Concern   Not on file  Social History Narrative   Lives with wife, sister in law   Social Determinants of Health   Financial Resource Strain: Not on file  Food Insecurity: Not on file  Transportation Needs: Not on file  Physical Activity: Not on file  Stress: Not on file  Social Connections: Not on file  Intimate Partner Violence: Not on file    FAMILY HISTORY: Family History  Problem Relation Age of Onset   Heart disease Father    Hypertension Father    COPD Father    Colon cancer Maternal Grandmother    Heart attack Paternal Grandfather     ALLERGIES:  is  allergic to axitinib and glucotrol [glipizide].  MEDICATIONS:  Current Outpatient Medications  Medication Sig Dispense Refill   ALPRAZolam (XANAX) 0.5 MG tablet Take 1 tablet (0.5 mg total) by mouth 2 (two) times daily. 60 tablet 0   aspirin 81 MG EC tablet Take 81 mg by mouth daily with supper.     clobetasol ointment (TEMOVATE) 2.94 % Apply 1 application topically 2 (two) times daily as needed for rash.     cyclobenzaprine (FLEXERIL) 10 MG tablet Take 10 mg by mouth 3 (three) times daily as needed for muscle spasms.     diphenoxylate-atropine (LOMOTIL) 2.5-0.025 MG tablet Take 1 tablet by mouth 4 (four) times daily as needed for diarrhea or loose stools. 60 tablet 1   Glucose Blood (ACCU-CHEK AVIVA PLUS VI) by In Vitro route.     JARDIANCE 10 MG TABS tablet TAKE 1 TABLET BY MOUTH DAILY 30 tablet 2   lisinopril (PRINIVIL,ZESTRIL) 10 MG tablet TAKE 1 TABLET(10 MG) BY MOUTH DAILY 90 tablet 1   metFORMIN (GLUCOPHAGE-XR) 500 MG 24 hr tablet Take 3 pills by mouth daily for 4-5 days,  then increase to 4 pills daily (if tolerated) (Patient taking differently: Take 1,000 mg by mouth daily with supper.) 360 tablet 0   Multiple Vitamin (MULTIVITAMIN) tablet Take 1 tablet by mouth daily with supper.     omeprazole (PRILOSEC) 20 MG capsule Take 20 mg by mouth daily as needed (he[place]).     OZEMPIC, 0.25 OR 0.5 MG/DOSE, 2 MG/1.5ML SOPN Inject 1 mg into the skin every Sunday.     rosuvastatin (CRESTOR) 20 MG tablet Take 20 mg by mouth daily with supper.     traZODone (DESYREL) 50 MG tablet TAKE 1 TABLET(50 MG) BY MOUTH AT BEDTIME 30 tablet 3   potassium chloride (KLOR-CON M) 10 MEQ tablet Take 1 tablet (10 mEq total) by mouth daily. (Patient not taking: Reported on 10/03/2021) 30 tablet 0   predniSONE (DELTASONE) 10 MG tablet Take 1 tablet (10 mg total) by mouth daily with breakfast. 14 tablet 0   No current facility-administered medications for this visit.     PHYSICAL EXAMINATION: ECOG PERFORMANCE  STATUS: 1 - Symptomatic but completely ambulatory Vitals:   10/06/21 1419  BP: 120/83  Pulse: (!) 115  Temp: 97.7 F (36.5 C)   Filed Weights   10/06/21 1419  Weight: 211 lb (95.7 kg)    Physical Exam Constitutional:      General: He is not in acute distress. HENT:     Head: Normocephalic and atraumatic.  Eyes:     General: No scleral icterus.    Pupils: Pupils are equal, round, and reactive to light.  Cardiovascular:     Rate and Rhythm: Normal rate and regular rhythm.     Heart sounds: Normal heart sounds.  Pulmonary:     Effort: Pulmonary effort is normal. No respiratory distress.     Breath sounds: No wheezing.  Abdominal:     General: Bowel sounds are normal. There is no distension.     Palpations: Abdomen is soft. There is no mass.     Tenderness: There is no abdominal tenderness.  Musculoskeletal:        General: No deformity. Normal range of motion.     Cervical back: Normal range of motion and neck supple.  Skin:    General: Skin is warm and dry.     Findings: No erythema or rash.  Neurological:     Mental Status: He is alert and oriented to person, place, and time. Mental status is at baseline.     Cranial Nerves: No cranial nerve deficit.     Coordination: Coordination normal.  Psychiatric:        Mood and Affect: Mood normal.    LABORATORY DATA:  I have reviewed the data as listed Lab Results  Component Value Date   WBC 9.2 10/06/2021   HGB 14.1 10/06/2021   HCT 41.7 10/06/2021   MCV 90.8 10/06/2021   PLT 237 10/06/2021   Recent Labs    08/31/21 1239 09/07/21 1254 10/06/21 1407  NA 135 133* 134*  K 5.1 4.0 4.8  CL 101 103 101  CO2 '27 23 25  '$ GLUCOSE 368* 185* 265*  BUN 23* 16 21*  CREATININE 1.13 1.04 1.16  CALCIUM 8.9 8.6* 9.0  GFRNONAA >60 >60 >60  PROT 6.7 6.4* 6.8  ALBUMIN 3.9 3.5 4.1  AST '16 17 26  '$ ALT '16 15 25  '$ ALKPHOS 64 57 55  BILITOT 1.1 0.7 0.7    Iron/TIBC/Ferritin/ %Sat    Component Value Date/Time   IRON 118  09/25/2019 1115  TIBC 454 (H) 09/25/2019 1115   FERRITIN 236 09/25/2019 1115   IRONPCTSAT 26 09/25/2019 1115      RADIOGRAPHIC STUDIES: I have personally reviewed the radiological images as listed and agreed with the findings in the report.  CT CHEST ABDOMEN PELVIS WO CONTRAST  Result Date: 08/02/2021 CLINICAL DATA:  History of metastatic renal cell carcinoma, undergoing immunotherapy. * Tracking Code: BO * EXAM: CT CHEST, ABDOMEN AND PELVIS WITHOUT CONTRAST TECHNIQUE: Multidetector CT imaging of the chest, abdomen and pelvis was performed following the standard protocol without IV contrast. RADIATION DOSE REDUCTION: This exam was performed according to the departmental dose-optimization program which includes automated exposure control, adjustment of the mA and/or kV according to patient size and/or use of iterative reconstruction technique. COMPARISON:  Multiple priors including most recent CT April 15, 2021 FINDINGS: CT CHEST FINDINGS Cardiovascular: Aortic and branch vessel atherosclerosis without aneurysmal dilation. Normal caliber central pulmonary artery. Coronary artery calcifications. Normal size heart. No significant pericardial effusion/thickening. Mediastinum/Nodes: No discrete thyroid nodule. No pathologically enlarged mediastinal, hilar or axillary lymph nodes, noting limited sensitivity for the detection of hilar adenopathy on this noncontrast study. Mild symmetric diffuse esophageal wall thickening is similar to prior examinations. Lungs/Pleura: Tiny 5 mm smaller bilateral upper lobe pulmonary nodules are stable over multiple prior examinations. No new suspicious pulmonary nodules or masses. No pleural effusion. No pneumothorax. Musculoskeletal: No aggressive lytic or blastic lesion of bone. CT ABDOMEN PELVIS FINDINGS Hepatobiliary: Unremarkable noncontrast appearance of the hepatic parenchyma. Gallbladder is unremarkable. No biliary ductal dilation. Pancreas: No pancreatic ductal  dilation or evidence of acute inflammation. Spleen: No splenomegaly or focal splenic lesion. Adrenals/Urinary Tract: Bilateral adrenal glands appear normal. Right kidney surgically absent without suspicious new suspicious soft tissue nodularity in the nephrectomy bed. Small hypodense and hypodense left renal lesions remain stable and have been previously characterized as Bosniak classification 1 and 2 cysts on prior CT June 21, 2018. No new suspicious renal lesion on noncontrast examination. No hydronephrosis. Urinary bladder is unremarkable for degree of distension. Stomach/Bowel: Radiopaque enteric contrast material traverses distal loops of small bowel. The stomach is unremarkable for degree of distension. There is no pathologic dilation of small or large bowel. The appendix and terminal ileum appear normal. No evidence of acute bowel inflammation. Vascular/Lymphatic: Aortic and branch vessel atherosclerosis without abdominal aortic aneurysm. No pathologically enlarged abdominal or pelvic lymph nodes. Reproductive: Prostate is unremarkable. Other: No significant abdominopelvic free fluid. Musculoskeletal: Multilevel degenerative changes spine. No aggressive lytic or blastic lesion of bone. IMPRESSION: 1. Stable examination without evidence recurrent or metastatic disease in the chest abdomen or pelvis. 2. Mild symmetric diffuse esophageal wall thickening, similar to prior examinations and suggestive of chronic esophagitis. 3.  Aortic Atherosclerosis (ICD10-I70.0). Electronically Signed   By: Dahlia Bailiff M.D.   On: 08/02/2021 16:21       ASSESSMENT & PLAN:  1. Clear cell carcinoma of right kidney ([place])   2. 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 -NED Hold immunotherapy due to diarrhea.  #Immunotherapy induced diarrhea, negative C. difficile, negative GI panel.   Continue Lomotil as needed. Slow taper prednisone.  Continue  10 mg daily for 2 more weeks.  #Anxiety, improved.  Continue Xanax 0.5 mg twice daily as needed for anxiety.  #Hypokalemia, likely due to diarrhea.  Continue potassium chloride 10 meq daily.  Follow-up in 2-3 weeks. All questions were answered. The patient knows to call the clinic with any problems  questions or concerns.  Earlie Server, MD, PhD 10/07/2021

## 2021-10-13 ENCOUNTER — Encounter: Payer: Self-pay | Admitting: Gastroenterology

## 2021-10-13 NOTE — Anesthesia Preprocedure Evaluation (Addendum)
Anesthesia Evaluation  Patient identified by MRN, date of birth, ID band Patient awake    Reviewed: Allergy & Precautions, NPO status , Patient's Chart, lab work & pertinent test results, reviewed documented beta blocker date and time   History of Anesthesia Complications Negative for: history of anesthetic complications  Airway Mallampati: III  TM Distance: >3 FB Neck ROM: Full    Dental  (+) Upper Dentures, Partial Lower   Pulmonary former smoker,    breath sounds clear to auscultation       Cardiovascular hypertension, (-) angina+ Peripheral Vascular Disease  (-) DOE  Rhythm:Regular Rate:Normal   HLD   Neuro/Psych  Headaches,    GI/Hepatic GERD  Controlled,  Endo/Other  diabetes  Renal/GU Renal disease (Stones, renal cancer)     Musculoskeletal  (+) Arthritis , Osteoarthritis,    Abdominal   Peds  Hematology   Anesthesia Other Findings   Reproductive/Obstetrics                            Anesthesia Physical Anesthesia Plan  ASA: 2  Anesthesia Plan: General   Post-op Pain Management:    Induction: Intravenous  PONV Risk Score and Plan: 2 and Propofol infusion, TIVA and Treatment may vary due to age or medical condition  Airway Management Planned: Natural Airway and Nasal Cannula  Additional Equipment:   Intra-op Plan:   Post-operative Plan:   Informed Consent: I have reviewed the patients History and Physical, chart, labs and discussed the procedure including the risks, benefits and alternatives for the proposed anesthesia with the patient or authorized representative who has indicated his/her understanding and acceptance.       Plan Discussed with: CRNA and Anesthesiologist  Anesthesia Plan Comments:         Anesthesia Quick Evaluation

## 2021-10-21 ENCOUNTER — Ambulatory Visit: Payer: 59 | Admitting: Anesthesiology

## 2021-10-21 ENCOUNTER — Ambulatory Visit
Admission: RE | Admit: 2021-10-21 | Discharge: 2021-10-21 | Disposition: A | Discharge status: Home / Self Care | Payer: 59 | Attending: Gastroenterology | Admitting: Gastroenterology

## 2021-10-21 ENCOUNTER — Other Ambulatory Visit: Payer: Self-pay

## 2021-10-21 ENCOUNTER — Encounter: Payer: Self-pay | Admitting: Gastroenterology

## 2021-10-21 ENCOUNTER — Encounter
Admission: RE | Disposition: A | Discharge status: Home / Self Care | Payer: Self-pay | Source: Home / Self Care | Attending: Gastroenterology

## 2021-10-21 DIAGNOSIS — M1990 Unspecified osteoarthritis, unspecified site: Secondary | ICD-10-CM | POA: Insufficient documentation

## 2021-10-21 DIAGNOSIS — I10 Essential (primary) hypertension: Secondary | ICD-10-CM | POA: Diagnosis not present

## 2021-10-21 DIAGNOSIS — E1151 Type 2 diabetes mellitus with diabetic peripheral angiopathy without gangrene: Secondary | ICD-10-CM | POA: Diagnosis not present

## 2021-10-21 DIAGNOSIS — Z87891 Personal history of nicotine dependence: Secondary | ICD-10-CM | POA: Insufficient documentation

## 2021-10-21 DIAGNOSIS — K529 Noninfective gastroenteritis and colitis, unspecified: Secondary | ICD-10-CM | POA: Diagnosis present

## 2021-10-21 DIAGNOSIS — K2100 Gastro-esophageal reflux disease with esophagitis, without bleeding: Secondary | ICD-10-CM | POA: Insufficient documentation

## 2021-10-21 DIAGNOSIS — K2289 Other specified disease of esophagus: Secondary | ICD-10-CM | POA: Insufficient documentation

## 2021-10-21 DIAGNOSIS — D125 Benign neoplasm of sigmoid colon: Secondary | ICD-10-CM | POA: Insufficient documentation

## 2021-10-21 DIAGNOSIS — K641 Second degree hemorrhoids: Secondary | ICD-10-CM | POA: Insufficient documentation

## 2021-10-21 DIAGNOSIS — K2980 Duodenitis without bleeding: Secondary | ICD-10-CM | POA: Insufficient documentation

## 2021-10-21 DIAGNOSIS — K52832 Lymphocytic colitis: Secondary | ICD-10-CM | POA: Diagnosis not present

## 2021-10-21 DIAGNOSIS — Z85528 Personal history of other malignant neoplasm of kidney: Secondary | ICD-10-CM | POA: Diagnosis not present

## 2021-10-21 DIAGNOSIS — K635 Polyp of colon: Secondary | ICD-10-CM

## 2021-10-21 DIAGNOSIS — K219 Gastro-esophageal reflux disease without esophagitis: Secondary | ICD-10-CM | POA: Insufficient documentation

## 2021-10-21 DIAGNOSIS — R933 Abnormal findings on diagnostic imaging of other parts of digestive tract: Secondary | ICD-10-CM | POA: Diagnosis not present

## 2021-10-21 DIAGNOSIS — C649 Malignant neoplasm of unspecified kidney, except renal pelvis: Secondary | ICD-10-CM

## 2021-10-21 DIAGNOSIS — R197 Diarrhea, unspecified: Secondary | ICD-10-CM

## 2021-10-21 DIAGNOSIS — K2270 Barrett's esophagus without dysplasia: Secondary | ICD-10-CM | POA: Insufficient documentation

## 2021-10-21 HISTORY — PX: ESOPHAGOGASTRODUODENOSCOPY: SHX5428

## 2021-10-21 HISTORY — PX: COLONOSCOPY: SHX5424

## 2021-10-21 HISTORY — PX: POLYPECTOMY: SHX5525

## 2021-10-21 LAB — GLUCOSE, CAPILLARY
Glucose-Capillary: 97 mg/dL (ref 70–99)
Glucose-Capillary: 90 mg/dL (ref 70–99)

## 2021-10-21 SURGERY — COLONOSCOPY
Anesthesia: General | Site: Rectum

## 2021-10-21 MED ORDER — SODIUM CHLORIDE 0.9 % IV SOLN: INTRAVENOUS | Status: DC

## 2021-10-21 MED ORDER — ONDANSETRON HCL 4 MG/2ML IJ SOLN
4.0000 mg | Freq: Once | INTRAMUSCULAR | Status: DC | PRN
Start: 2021-10-21 — End: 2021-10-21

## 2021-10-21 MED ORDER — ACETAMINOPHEN 160 MG/5ML PO SOLN: 325.0000 mg | ORAL | Status: DC | PRN

## 2021-10-21 MED ORDER — LIDOCAINE HCL (CARDIAC) PF 100 MG/5ML IV SOSY
PREFILLED_SYRINGE | INTRAVENOUS | Status: DC | PRN
  Administered 2021-10-21: 60 mg via INTRAVENOUS

## 2021-10-21 MED ORDER — ACETAMINOPHEN 325 MG PO TABS: 650.0000 mg | ORAL_TABLET | ORAL | Status: DC | PRN

## 2021-10-21 MED ORDER — STERILE WATER FOR IRRIGATION IR SOLN
Status: DC | PRN
  Administered 2021-10-21: 150 mL

## 2021-10-21 MED ORDER — STERILE WATER FOR IRRIGATION IR SOLN
Status: DC | PRN
  Administered 2021-10-21: 60 mL

## 2021-10-21 MED ORDER — PROPOFOL 10 MG/ML IV BOLUS
INTRAVENOUS | Status: DC | PRN
  Administered 2021-10-21 (×5): 50 mg via INTRAVENOUS
  Administered 2021-10-21: 20 mg via INTRAVENOUS
  Administered 2021-10-21: 50 mg via INTRAVENOUS

## 2021-10-21 MED ORDER — LACTATED RINGERS IV SOLN: INTRAVENOUS | Status: DC

## 2021-10-21 SURGICAL SUPPLY — 38 items
BALLN DILATOR 10-12 8 (BALLOONS)
BALLN DILATOR 12-15 8 (BALLOONS)
BALLN DILATOR 15-18 8 (BALLOONS)
BALLN DILATOR CRE 0-12 8 (BALLOONS)
BALLN DILATOR ESOPH 8 10 CRE (MISCELLANEOUS) IMPLANT
BALLOON DILATOR 12-15 8 (BALLOONS) IMPLANT
BALLOON DILATOR 15-18 8 (BALLOONS) IMPLANT
BALLOON DILATOR CRE 0-12 8 (BALLOONS) IMPLANT
BLOCK BITE 60FR ADLT L/F GRN (MISCELLANEOUS) ×5 IMPLANT
CLIP HMST 235XBRD CATH ROT (MISCELLANEOUS) IMPLANT
CLIP RESOLUTION 360 11X235 (MISCELLANEOUS)
ELECT REM PT RETURN 9FT ADLT (ELECTROSURGICAL)
ELECTRODE REM PT RTRN 9FT ADLT (ELECTROSURGICAL) IMPLANT
FCP ESCP3.2XJMB 240X2.8X (MISCELLANEOUS)
FORCEPS BIOP RAD 4 LRG CAP 4 (CUTTING FORCEPS) ×1 IMPLANT
FORCEPS BIOP RJ4 240 W/NDL (MISCELLANEOUS)
FORCEPS ESCP3.2XJMB 240X2.8X (MISCELLANEOUS) IMPLANT
GOWN CVR UNV OPN BCK APRN NK (MISCELLANEOUS) ×16 IMPLANT
GOWN ISOL THUMB LOOP REG UNIV (MISCELLANEOUS) ×16
INJECTOR VARIJECT VIN23 (MISCELLANEOUS) IMPLANT
KIT DEFENDO VALVE AND CONN (KITS) IMPLANT
KIT PRC NS LF DISP ENDO (KITS) ×8 IMPLANT
KIT PROCEDURE OLYMPUS (KITS) ×8
MANIFOLD NEPTUNE II (INSTRUMENTS) ×10 IMPLANT
MARKER SPOT ENDO TATTOO 5ML (MISCELLANEOUS) IMPLANT
PROBE APC STR FIRE (PROBE) IMPLANT
RETRIEVER NET PLAT FOOD (MISCELLANEOUS) IMPLANT
RETRIEVER NET ROTH 2.5X230 LF (MISCELLANEOUS) IMPLANT
SNARE COLD EXACTO (MISCELLANEOUS) IMPLANT
SNARE SHORT THROW 13M SML OVAL (MISCELLANEOUS) IMPLANT
SNARE SHORT THROW 30M LRG OVAL (MISCELLANEOUS) IMPLANT
SNARE SNG USE RND 15MM (INSTRUMENTS) IMPLANT
SPOT EX ENDOSCOPIC TATTOO (MISCELLANEOUS)
SYR INFLATION 60ML (SYRINGE) IMPLANT
TRAP ETRAP POLY (MISCELLANEOUS) IMPLANT
VARIJECT INJECTOR VIN23 (MISCELLANEOUS)
WATER STERILE IRR 250ML POUR (IV SOLUTION) ×10 IMPLANT
WIRE CRE 18-20MM 8CM F G (MISCELLANEOUS) IMPLANT

## 2021-10-21 NOTE — Anesthesia Postprocedure Evaluation (Signed)
Anesthesia Post Note  Patient: Matthew Woodard  Procedure(s) Performed: COLONOSCOPY (Rectum) POLYPECTOMY (Rectum) ESOPHAGOGASTRODUODENOSCOPY (EGD) (Mouth)     Patient location during evaluation: PACU Anesthesia Type: General Level of consciousness: awake and alert Pain management: pain level controlled Vital Signs Assessment: post-procedure vital signs reviewed and stable Respiratory status: spontaneous breathing, nonlabored ventilation, respiratory function stable and patient connected to nasal cannula oxygen Cardiovascular status: blood pressure returned to baseline and stable Postop Assessment: no apparent nausea or vomiting Anesthetic complications: no   No notable events documented.  Sulma Ruffino A  Zi Sek

## 2021-10-21 NOTE — H&P (Signed)
[name], MD [facility] [address], [place], [place] [address] Phone:[phone] Fax : [phone]  Primary Care Physician:  Danae Orleans, MD Primary Gastroenterologist:  Dr. Bettenhausen Norris  Pre-Procedure History & Physical: HPI:  Matthew Woodard is a 56 y.o. male is here for an endoscopy and colonoscopy.   Past Medical History:  Diagnosis Date   Arthritis    hands, left knee   Cancer ([place])    Diabetes mellitus without complication ([place])    Eczema 01/08/2017   GERD (gastroesophageal reflux disease)    History of kidney cancer    Hyperlipidemia LDL goal <100 11/02/2014   Hypertension    Kidney stone    YEAR H/O HEMATURIA   Psoriasis    Wears dentures    full upper, partial lower.  Has, does not wear    Past Surgical History:  Procedure Laterality Date   COLONOSCOPY WITH PROPOFOL N/A 02/23/2017   Procedure: COLONOSCOPY WITH PROPOFOL;  Surgeon: [name], MD;  Location: [place];  Service: Endoscopy;  Laterality: N/A;  Diabetic - oral meds   CYST EXCISION  Aug. 2016   on back   CYSTOSCOPY W/ RETROGRADES Right 06/28/2018   Procedure: CYSTOSCOPY WITH RETROGRADE PYELOGRAM;  Surgeon: Billey Co, MD;  Location: [facility];  Service: Urology;  Laterality: Right;   CYSTOSCOPY W/ URETERAL STENT REMOVAL Right 07/22/2018   Procedure: CYSTOSCOPY WITH STENT REMOVAL;  Surgeon: Billey Co, MD;  Location: [facility];  Service: Urology;  Laterality: Right;   CYSTOSCOPY WITH BIOPSY Right 06/28/2018   Procedure: CYSTOSCOPY WITH RENAL BIOPSY;  Surgeon: Billey Co, MD;  Location: [facility];  Service: Urology;  Laterality: Right;   CYSTOSCOPY WITH URETEROSCOPY AND STENT PLACEMENT Right 06/28/2018   Procedure: CYSTOSCOPY WITH URETEROSCOPY AND STENT PLACEMENT;  Surgeon: Billey Co, MD;  Location: [facility];  Service: Urology;  Laterality: Right;   FULGURATION OF BLADDER TUMOR N/A 06/28/2018   Procedure: CYSTOSCOPY BLADDER BIOPSY, FULGERATION OF BLADDER;  Surgeon:  Billey Co, MD;  Location: [facility];  Service: Urology;  Laterality: N/A;   KNEE SURGERY Left    LAPAROSCOPIC NEPHRECTOMY, HAND ASSISTED Right 07/22/2018   Procedure: HAND ASSISTED LAPAROSCOPIC NEPHRECTOMY;  Surgeon: Billey Co, MD;  Location: [facility];  Service: Urology;  Laterality: Right;   NASAL SINUS SURGERY  03/15   POLYPECTOMY N/A 02/23/2017   Procedure: POLYPECTOMY;  Surgeon: [name], MD;  Location: [place];  Service: Endoscopy;  Laterality: N/A;   tooth abstraction     TRANSURETHRAL RESECTION OF BLADDER TUMOR N/A 05/12/2019   Procedure: TRANSURETHRAL RESECTION OF BLADDER TUMOR (TURBT);  Surgeon: Billey Co, MD;  Location: [facility];  Service: Urology;  Laterality: N/A;   VARICOCELE EXCISION     XI ROBOTIC ASSISTED VENTRAL HERNIA N/A 11/18/2020   Procedure: XI ROBOTIC ASSISTED VENTRAL HERNIA, incisional;  Surgeon: Jules Husbands, MD;  Location: [facility];  Service: General;  Laterality: N/A;    Prior to Admission medications   Medication Sig Start Date End Date Taking? Authorizing Provider  aspirin 81 MG EC tablet Take 81 mg by mouth daily with supper. 07/30/20  Yes [provider]  cyclobenzaprine (FLEXERIL) 10 MG tablet Take 10 mg by mouth 3 (three) times daily as needed for muscle spasms.   Yes [provider]  diphenoxylate-atropine (LOMOTIL) 2.5-0.025 MG tablet Take 1 tablet by mouth 4 (four) times daily as needed for diarrhea or loose stools. 10/03/21  Yes Earlie Server, MD  JARDIANCE 10 MG TABS tablet TAKE 1 TABLET BY MOUTH DAILY 12/05/18  Yes Hubbard Hartshorn, FNP  lisinopril (PRINIVIL,ZESTRIL) 10 MG tablet TAKE 1 TABLET(10 MG) BY MOUTH DAILY 08/01/18  Yes [name], [name], MD  metFORMIN (GLUCOPHAGE-XR) 500 MG 24 hr tablet Take 3 pills by mouth daily for 4-5 days, then increase to 4 pills daily (if tolerated) Patient taking differently: Take 1,000 mg by mouth daily with supper. 08/15/18  Yes [name], [name], MD  Multiple Vitamin (MULTIVITAMIN)  tablet Take 1 tablet by mouth daily with supper.   Yes [provider]  omeprazole (PRILOSEC) 20 MG capsule Take 20 mg by mouth daily as needed ([place]).   Yes [provider]  OZEMPIC, 0.25 OR 0.5 MG/DOSE, 2 MG/1.5ML SOPN Inject 1 mg into the skin every Sunday. 06/13/19  Yes [provider]  predniSONE (DELTASONE) 10 MG tablet Take 1 tablet (10 mg total) by mouth daily with breakfast. 10/06/21  Yes Earlie Server, MD  rosuvastatin (CRESTOR) 20 MG tablet Take 20 mg by mouth daily with supper.   Yes [provider]  ALPRAZolam (XANAX) 0.5 MG tablet Take 1 tablet (0.5 mg total) by mouth 2 (two) times daily. Patient taking differently: Take 0.5 mg by mouth 2 (two) times daily as needed. 08/17/21   Earlie Server, MD  clobetasol ointment (TEMOVATE) 7.16 % Apply 1 application topically 2 (two) times daily as needed for rash. 08/16/20   [provider]  Glucose Blood (ACCU-CHEK AVIVA PLUS VI) by In Vitro route.    [provider]  traZODone (DESYREL) 50 MG tablet TAKE 1 TABLET(50 MG) BY MOUTH AT BEDTIME Patient not taking: Reported on 10/13/2021 02/23/21   Earlie Server, MD    Allergies as of 10/03/2021 - Review Complete 09/07/2021  Allergen Reaction Noted   Axitinib  07/29/2020   Glucotrol [glipizide] Other (See Comments) 10/22/2014    Family History  Problem Relation Age of Onset   Heart disease Father    Hypertension Father    COPD Father    Colon cancer Maternal Grandmother    Heart attack Paternal Grandfather     Social History   Socioeconomic History   Marital status: Married    Spouse name: Alma Friendly   Number of children: Not on file   Years of education: Not on file   Highest education level: Not on file  Occupational History   Not on file  Tobacco Use   Smoking status: Former    Types: Cigarettes    Quit date: 06/15/2013    Years since quitting: 8.3   Smokeless tobacco: Never  Vaping Use   Vaping Use: Never used  Substance and Sexual Activity    Alcohol use: No    Alcohol/week: 0.0 standard drinks of alcohol   Drug use: No   Sexual activity: Yes  Other Topics Concern   Not on file  Social History Narrative   Lives with wife, sister in law   Social Determinants of Health   Financial Resource Strain: Not on file  Food Insecurity: Not on file  Transportation Needs: Not on file  Physical Activity: Not on file  Stress: Not on file  Social Connections: Not on file  Intimate Partner Violence: Not on file    Review of Systems: See HPI, otherwise negative ROS  Physical Exam: BP 129/79   Pulse 72   Temp 98.1 F (36.7 C) (Temporal)   Ht 6' (1.829 m)   Wt 96 kg   SpO2 98%  BMI 28.70 kg/m  General:   Alert,  pleasant and cooperative in NAD Head:  Normocephalic and atraumatic. Neck:  Supple; no masses or thyromegaly. Lungs:  Clear throughout to auscultation.    Heart:  Regular rate and rhythm. Abdomen:  Soft, nontender and nondistended. Normal bowel sounds, without guarding, and without rebound.   Neurologic:  Alert and  oriented x4;  grossly normal neurologically.  Impression/Plan: Matthew Woodard is here for an endoscopy and colonoscopy to be performed for diarrhea and abnormal CT of the esophagus  Risks, benefits, limitations, and alternatives regarding  endoscopy and colonoscopy have been reviewed with the patient.  Questions have been answered.  All parties agreeable.   [name], MD  10/21/2021, 10:36 AM

## 2021-10-21 NOTE — Transfer of Care (Signed)
Immediate Anesthesia Transfer of Care Note  Patient: Matthew Woodard  Procedure(s) Performed: COLONOSCOPY (Rectum) POLYPECTOMY (Rectum) ESOPHAGOGASTRODUODENOSCOPY (EGD) (Mouth)  Patient Location: PACU  Anesthesia Type: General  Level of Consciousness: awake, alert  and patient cooperative  Airway and Oxygen Therapy: Patient Spontanous Breathing and Patient connected to supplemental oxygen  Post-op Assessment: Post-op Vital signs reviewed, Patient's Cardiovascular Status Stable, Respiratory Function Stable, Patent Airway and No signs of Nausea or vomiting  Post-op Vital Signs: Reviewed and stable  Complications: No notable events documented.

## 2021-10-21 NOTE — Op Note (Signed)
[facility] Gastroenterology Patient Name: Matthew Woodard Procedure Date: 10/21/2021 11:19 AM MRN: [id number] Account #: [id number] Date of Birth: 1966-02-28 Admit Type: Outpatient Age: 56 Room: [facility] ROOM 01 Gender: Male Note Status: Finalized Instrument Name: [id number] Procedure:             Colonoscopy Indications:           Chronic diarrhea Providers:             [name] MD, MD Referring MD:          Danae Orleans MD (Referring MD) Medicines:             Propofol per Anesthesia Complications:         No immediate complications. Procedure:             Pre-Anesthesia Assessment:                        - Prior to the procedure, a History and Physical was                         performed, and patient medications and allergies were                         reviewed. The patient's tolerance of previous                         anesthesia was also reviewed. The risks and benefits                         of the procedure and the sedation options and risks                         were discussed with the patient. All questions were                         answered, and informed consent was obtained. Prior                         Anticoagulants: The patient has taken no previous                         anticoagulant or antiplatelet agents. ASA Grade                         Assessment: II - A patient with mild systemic disease.                         After reviewing the risks and benefits, the patient                         was deemed in satisfactory condition to undergo the                         procedure.                        After obtaining informed consent, the colonoscope was  passed under direct vision. Throughout the procedure,                         the patient's blood pressure, pulse, and oxygen                         saturations were monitored continuously. The                         Colonoscope was introduced through the anus  and                         advanced to the the ileocecal valve. The colonoscopy                         was performed without difficulty. The patient                         tolerated the procedure well. The quality of the bowel                         preparation was good. Findings:      The perianal and digital rectal examinations were normal.      The terminal ileum appeared normal. Biopsies were taken with a cold       forceps for histology.      A 3 mm polyp was found in the sigmoid colon. The polyp was sessile. The       polyp was removed with a cold biopsy forceps. Resection and retrieval       were complete.      Non-bleeding internal hemorrhoids were found during retroflexion. The       hemorrhoids were Grade II (internal hemorrhoids that prolapse but reduce       spontaneously).      Random biopsies were obtained with cold forceps for histology randomly       in the entire colon. Impression:            - The examined portion of the ileum was normal.                         Biopsied.                        - One 3 mm polyp in the sigmoid colon, removed with a                         cold biopsy forceps. Resected and retrieved.                        - Non-bleeding internal hemorrhoids.                        - Random biopsies were obtained in the entire colon. Recommendation:        - Discharge patient to home.                        - Resume previous diet.                        -  Continue present medications.                        - Await pathology results.                        - If the pathology report reveals adenomatous tissue,                         then repeat the colonoscopy for surveillance in 7                         years. Procedure Code(s):     --- Professional ---                        [phone], Colonoscopy, flexible; with biopsy, single or                         multiple Diagnosis Code(s):     --- Professional ---                        K52.9,  Noninfective gastroenteritis and colitis,                         unspecified                        K63.5, Polyp of colon CPT copyright 2019 American Medical Association. All rights reserved. The codes documented in this report are preliminary and upon coder review may  be revised to meet current compliance requirements. [name] MD, MD 10/21/2021 11:54:18 AM This report has been signed electronically. Number of Addenda: 0 Note Initiated On: 10/21/2021 11:19 AM Scope Withdrawal Time: 0 hours 6 minutes 42 seconds  Total Procedure Duration: 0 hours 16 minutes 31 seconds  Estimated Blood Loss:  Estimated blood loss: none.      [facility]

## 2021-10-21 NOTE — Op Note (Signed)
[facility] Gastroenterology Patient Name: Matthew Woodard Procedure Date: 10/21/2021 11:20 AM MRN: [id number] Account #: [id number] Date of Birth: 1966-04-24 Admit Type: Outpatient Age: 56 Room: [facility] OR ROOM 01 Gender: Male Note Status: Finalized Instrument Name: [id number] Procedure:             Upper GI endoscopy Indications:           Abnormal CT of the GI tract Providers:             [name] MD, MD Referring MD:          Danae Orleans MD (Referring MD) Medicines:             Propofol per Anesthesia Complications:         No immediate complications. Procedure:             Pre-Anesthesia Assessment:                        - Prior to the procedure, a History and Physical was                         performed, and patient medications and allergies were                         reviewed. The patient's tolerance of previous                         anesthesia was also reviewed. The risks and benefits                         of the procedure and the sedation options and risks                         were discussed with the patient. All questions were                         answered, and informed consent was obtained. Prior                         Anticoagulants: The patient has taken no previous                         anticoagulant or antiplatelet agents. ASA Grade                         Assessment: II - A patient with mild systemic disease.                         After reviewing the risks and benefits, the patient                         was deemed in satisfactory condition to undergo the                         procedure.                        After obtaining informed consent, the endoscope was  passed under direct vision. Throughout the procedure,                         the patient's blood pressure, pulse, and oxygen                         saturations were monitored continuously. The Endoscope                         was introduced  through the mouth, and advanced to the                         second part of duodenum. The upper GI endoscopy was                         accomplished without difficulty. The patient tolerated                         the procedure well. Findings:      There were esophageal mucosal changes suspicious for long-segment       Barrett's esophagus present in the lower third of the esophagus. The       maximum longitudinal extent of these mucosal changes was 4 cm in length.       This was biopsied with a cold forceps for histology.      The stomach was normal.      The examined duodenum was normal. Biopsies for histology were taken with       a cold forceps for evaluation of celiac disease. Impression:            - Esophageal mucosal changes suspicious for                         long-segment Barrett's esophagus. Biopsied.                        - Normal stomach.                        - Normal examined duodenum. Biopsied. Recommendation:        - Discharge patient to home.                        - Resume previous diet.                        - Continue present medications.                        - Await pathology results.                        - Perform a colonoscopy today. Procedure Code(s):     --- Professional ---                        [id number], Esophagogastroduodenoscopy, flexible,                         transoral; with biopsy, single or multiple Diagnosis Code(s):     --- Professional ---  R93.3, Abnormal findings on diagnostic imaging of                         other parts of digestive tract                        K22.8, Other specified diseases of esophagus CPT copyright 2019 American Medical Association. All rights reserved. The codes documented in this report are preliminary and upon coder review may  be revised to meet current compliance requirements. [name] MD, MD 10/21/2021 11:32:31 AM This report has been signed electronically. Number of Addenda:  0 Note Initiated On: 10/21/2021 11:20 AM Total Procedure Duration: 0 hours 3 minutes 48 seconds  Estimated Blood Loss:  Estimated blood loss: none.      [facility]

## 2021-10-24 ENCOUNTER — Encounter: Payer: Self-pay | Admitting: Gastroenterology

## 2021-10-25 LAB — SURGICAL PATHOLOGY

## 2021-10-26 ENCOUNTER — Inpatient Hospital Stay: Payer: 59 | Attending: Oncology | Admitting: Oncology

## 2021-10-26 ENCOUNTER — Encounter: Payer: Self-pay | Admitting: Oncology

## 2021-10-26 VITALS — BP 142/84 | HR 108 | Temp 97.7°F | Resp 18 | Wt 209.9 lb

## 2021-10-26 DIAGNOSIS — K521 Toxic gastroenteritis and colitis: Secondary | ICD-10-CM | POA: Diagnosis not present

## 2021-10-26 DIAGNOSIS — C641 Malignant neoplasm of right kidney, except renal pelvis: Secondary | ICD-10-CM | POA: Diagnosis present

## 2021-10-26 DIAGNOSIS — Z96653 Presence of artificial knee joint, bilateral: Secondary | ICD-10-CM | POA: Insufficient documentation

## 2021-10-26 DIAGNOSIS — R519 Headache, unspecified: Secondary | ICD-10-CM | POA: Insufficient documentation

## 2021-10-26 DIAGNOSIS — Z79899 Other long term (current) drug therapy: Secondary | ICD-10-CM | POA: Diagnosis not present

## 2021-10-26 DIAGNOSIS — R7401 Elevation of levels of liver transaminase levels: Secondary | ICD-10-CM | POA: Diagnosis not present

## 2021-10-26 DIAGNOSIS — T451X5A Adverse effect of antineoplastic and immunosuppressive drugs, initial encounter: Secondary | ICD-10-CM

## 2021-10-26 DIAGNOSIS — Z905 Acquired absence of kidney: Secondary | ICD-10-CM | POA: Diagnosis not present

## 2021-10-26 DIAGNOSIS — Z7982 Long term (current) use of aspirin: Secondary | ICD-10-CM | POA: Diagnosis not present

## 2021-10-26 DIAGNOSIS — Z87891 Personal history of nicotine dependence: Secondary | ICD-10-CM | POA: Insufficient documentation

## 2021-10-26 DIAGNOSIS — R918 Other nonspecific abnormal finding of lung field: Secondary | ICD-10-CM | POA: Diagnosis not present

## 2021-10-26 DIAGNOSIS — Z7952 Long term (current) use of systemic steroids: Secondary | ICD-10-CM | POA: Insufficient documentation

## 2021-10-26 DIAGNOSIS — Z7984 Long term (current) use of oral hypoglycemic drugs: Secondary | ICD-10-CM | POA: Insufficient documentation

## 2021-10-26 DIAGNOSIS — K209 Esophagitis, unspecified without bleeding: Secondary | ICD-10-CM | POA: Diagnosis not present

## 2021-10-26 DIAGNOSIS — F419 Anxiety disorder, unspecified: Secondary | ICD-10-CM | POA: Diagnosis not present

## 2021-10-26 MED ORDER — PREDNISONE 10 MG PO TABS: 10.0000 mg | ORAL_TABLET | Freq: Every day | ORAL | 0 refills | Status: DC

## 2021-10-26 MED ORDER — PANTOPRAZOLE SODIUM 40 MG PO TBEC: 40.0000 mg | DELAYED_RELEASE_TABLET | Freq: Every day | ORAL | 2 refills | Status: DC

## 2021-10-26 MED ORDER — ALPRAZOLAM 0.5 MG PO TABS: 0.5000 mg | ORAL_TABLET | Freq: Two times a day (BID) | ORAL | 0 refills | Status: DC | PRN

## 2021-10-26 NOTE — Progress Notes (Signed)
Pt here for follow. Pt reports that he recently had a colonoscopy and after the procedure he broke out in a rash.

## 2021-10-26 NOTE — Progress Notes (Signed)
Hematology/Oncology Progress note [phone] [phone]      Patient Care Team: Danae Orleans, MD as PCP - General (Internal Medicine) [name], [name], MD as Consulting Physician (Dermatology) Earlie Server, MD as Consulting Physician (Oncology)  REFERRING PROVIDER: Danae Orleans, MD  CHIEF COMPLAINTS/REASON FOR VISIT:  Follow-up of kidney cancer  HISTORY OF PRESENTING ILLNESS:   Matthew Woodard is a  56 y.o.  male with PMH listed below was seen in consultation at the request of  Danae Orleans, MD  for evaluation of kidney cancer Patient's cancer history dated back to February 2020 when he developed gross hematuria with small clots and right-sided flank pain.  Patient had a CT urogram done which showed right 4 cm central enhancing renal mass with invasion into the collecting system.  X-ray of chest was performed to see complete staging which showed no concerning findings. 06/28/2018 patient underwent a cystoscopy, bladder biopsy and a right retrograde pyelogram with intraoperative interpretation, right diagnostic ureteroscopy, right renal pelvis biopsy, right ureteral stent placement.  biopsy showed atypical cell clusters, with extensive crush artifact, nondiagnostic.   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  Surgical margins negative for tumor.pT3a,Nx Patient has been on surveillance after surgery. 10/28/2018 CT abdomen pelvis with contrast showed minimal fluid and or postoperative changes within the right renal fossa.  No evidence of metastatic disease or recurrence.  Patient has left kidney lesion previously characterized as nonenhancing cyst by multi phasic contrast-enhanced CT. Patient reports an episode of gross hematuria in early December 2020, patient is due for 12-month surveillance CT scan. 04/30/2019 CT abdomen pelvis with contrast showed high attenuation mass at the right uretero vesicle junction, with extension into the bladder.  Bilateral pulmonary nodules, most indicated for metastatic disease.   Patient underwent cystoscopy and transurethral resection of irregular bladder tumor 3 cm.  Mass appeared to emanate from the right ureteral orifice.  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 Patient was referred to me for further work-up and management.  #Staging chest CT with contrast showed numerous bilateral pulmonary nodules.  Compatible with metastatic disease. Bone scan showed no osseous metastatic disease.   #History of tobacco abuse, former smoker.  Quit in 2015.  He denies any hemoptysis, chest pain, shortness of breath today. Patient reports that hematuria has completely resolved.  Denies any pain today. He is accompanied by his wife.  #History of cutaneous psoriasis, mostly on his right hand, currently on weekly methotrexate 12.5 mg with good symptom control. #NGS: [facility] PD L1 TPS 1%  # patient was referred to ENT for evaluation of headache and sinus pressure.  He had a ENT evaluation and feels that patient does not have sinusitis.  [name] recommended MRI brain to rule out PRES syndrome.  Axitinib has been held MRI brain was done and was negative.   #09/2019 off axitinib and Keytruda since the beginning of May due to transaminitis. Patient was seen and evaluated by gastroenterology Dr. AVicente Males Work-up was negative. Ultrasound liver vascular Doppler is negative. Recommend observation. #11/24/2019, resumed on Keytruda every 3 weeks #01/05/2020, axitinib was resumed 3 mg #01/20/2020, axitinib was discontinued due to recurrence of transaminitis  KBeryle Flockwas temporarily held. #03/16/2020, resumed on Keytruda every 3 weeks. 09/29/2020 CT scan showed no evidence of local recurrence or new/progressive disease  in the chest abdomen or  pelvis.  Tiny nonspecific lung nodules are unchanged.  No new lung nodules. May 2022, left knee replacement 01/14/2021 CT chest abdomen pelvis showed stable exam, no new or progressive metastatic disease within chest abdomen pelvis. Stable lung nodules. Aortic atherosclerosis.  Sept 2022 right knee replacement 04/15/2021, CT chest abdomen pelvis with contrast showed stable examination.  No evidence of recurrent or metastatic carcinoma within the chest abdomen pelvis.  08/02/2021, CT chest abdomen pelvis without contrast, stable examination without evidence of recurrence or metastatic disease.  Mild asymmetric diffuse esophageal wall thickening, similar to prior examination.  Likely chronic esophagitis.  Aortic atherosclerosis.  INTERVAL HISTORY Matthew Woodard is a 56 y.o. male who has above history reviewed by me today presents for follow up visit for management of metastatic RCC. Diarrhea has improved.  He continues using Lomotil as instructed.   Currently on prednisone 10 mg daily.  Tolerates well. 10/21/2021, colonoscopy showed 3 mm polyp which was resected and retrieved.  Negative for dysplasia/malignancy.  Random biopsy showed lymphocytic colitis. EGD showed long segment Barrett's esophagus.  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  Negative for dysplasia and malignancy.  Small bowel cold biopsy showed reactive duodenitis.  Negative for CMV.  Negative for dysplasia or malignancy.  Review of Systems  Constitutional:  Negative for appetite change, chills, fatigue, fever and unexpected weight change.  HENT:   Negative for hearing loss and voice change.   Eyes:  Negative for eye problems and icterus.  Respiratory:  Negative for chest tightness, cough and shortness of breath.   Cardiovascular:  Negative for chest pain and leg swelling.  Gastrointestinal:  Negative for abdominal distention, abdominal pain, blood in stool, diarrhea and  nausea.  Endocrine: Negative for hot flashes.  Genitourinary:  Negative for difficulty urinating, dysuria and frequency.   Musculoskeletal:  Positive for arthralgias.  Skin:  Negative for itching and rash.  Neurological:  Negative for extremity weakness, headaches, light-headedness and numbness.  Hematological:  Negative for adenopathy. Does not bruise/bleed easily.  Psychiatric/Behavioral:  Negative for confusion.     MEDICAL HISTORY:  Past Medical History:  Diagnosis Date   Arthritis    hands, left knee   Cancer ([place])    Diabetes mellitus without complication ([place])    Eczema 01/08/2017   GERD (gastroesophageal reflux disease)    History of kidney cancer    Hyperlipidemia LDL goal <100 11/02/2014   Hypertension    Kidney stone    YEAR H/O HEMATURIA   Psoriasis    Wears dentures    full upper, partial lower.  Has, does not wear    SURGICAL HISTORY: Past Surgical History:  Procedure Laterality Date   COLONOSCOPY N/A 10/21/2021   Procedure: COLONOSCOPY;  Surgeon: [name], MD;  Location: [place];  Service: Endoscopy;  Laterality: N/A;   COLONOSCOPY WITH PROPOFOL N/A 02/23/2017   Procedure: COLONOSCOPY WITH PROPOFOL;  Surgeon: [name], MD;  Location: [place];  Service: Endoscopy;  Laterality: N/A;  Diabetic - oral meds   CYST EXCISION  Aug. 2016   on back   CYSTOSCOPY W/ RETROGRADES Right 06/28/2018   Procedure: CYSTOSCOPY WITH RETROGRADE PYELOGRAM;  Surgeon: Billey Co, MD;  Location: [facility];  Service: Urology;  Laterality: Right;   CYSTOSCOPY W/ URETERAL STENT REMOVAL Right 07/22/2018   Procedure: CYSTOSCOPY WITH STENT REMOVAL;  Surgeon: Billey Co, MD;  Location: [facility];  Service: Urology;  Laterality: Right;   CYSTOSCOPY WITH BIOPSY Right 06/28/2018  Procedure: CYSTOSCOPY WITH RENAL BIOPSY;  Surgeon: Billey Co, MD;  Location: [facility];  Service: Urology;  Laterality: Right;   CYSTOSCOPY WITH URETEROSCOPY AND STENT PLACEMENT  Right 06/28/2018   Procedure: CYSTOSCOPY WITH URETEROSCOPY AND STENT PLACEMENT;  Surgeon: Billey Co, MD;  Location: [facility];  Service: Urology;  Laterality: Right;   ESOPHAGOGASTRODUODENOSCOPY N/A 10/21/2021   Procedure: ESOPHAGOGASTRODUODENOSCOPY (EGD);  Surgeon: [name], MD;  Location: [place];  Service: Endoscopy;  Laterality: N/A;   FULGURATION OF BLADDER TUMOR N/A 06/28/2018   Procedure: CYSTOSCOPY BLADDER BIOPSY, FULGERATION OF BLADDER;  Surgeon: Billey Co, MD;  Location: [facility];  Service: Urology;  Laterality: N/A;   KNEE SURGERY Left    LAPAROSCOPIC NEPHRECTOMY, HAND ASSISTED Right 07/22/2018   Procedure: HAND ASSISTED LAPAROSCOPIC NEPHRECTOMY;  Surgeon: Billey Co, MD;  Location: [facility];  Service: Urology;  Laterality: Right;   NASAL SINUS SURGERY  03/15   POLYPECTOMY N/A 02/23/2017   Procedure: POLYPECTOMY;  Surgeon: [name], MD;  Location: [place];  Service: Endoscopy;  Laterality: N/A;   POLYPECTOMY  10/21/2021   Procedure: POLYPECTOMY;  Surgeon: [name], MD;  Location: [place];  Service: Endoscopy;;   tooth abstraction     TRANSURETHRAL RESECTION OF BLADDER TUMOR N/A 05/12/2019   Procedure: TRANSURETHRAL RESECTION OF BLADDER TUMOR (TURBT);  Surgeon: Billey Co, MD;  Location: [facility];  Service: Urology;  Laterality: N/A;   VARICOCELE EXCISION     XI ROBOTIC ASSISTED VENTRAL HERNIA N/A 11/18/2020   Procedure: XI ROBOTIC ASSISTED VENTRAL HERNIA, incisional;  Surgeon: Jules Husbands, MD;  Location: [facility];  Service: General;  Laterality: N/A;    SOCIAL HISTORY: Social History   Socioeconomic History   Marital status: Married    Spouse name: Alma Friendly   Number of children: Not on file   Years of education: Not on file   Highest education level: Not on file  Occupational History   Not on file  Tobacco Use   Smoking status: Former    Types: Cigarettes    Quit date: 06/15/2013    Years since quitting: 8.3    Smokeless tobacco: Never  Vaping Use   Vaping Use: Never used  Substance and Sexual Activity   Alcohol use: No    Alcohol/week: 0.0 standard drinks of alcohol   Drug use: No   Sexual activity: Yes  Other Topics Concern   Not on file  Social History Narrative   Lives with wife, sister in law   Social Determinants of Health   Financial Resource Strain: Not on file  Food Insecurity: Not on file  Transportation Needs: Not on file  Physical Activity: Not on file  Stress: Not on file  Social Connections: Not on file  Intimate Partner Violence: Not on file    FAMILY HISTORY: Family History  Problem Relation Age of Onset   Heart disease Father    Hypertension Father    COPD Father    Colon cancer Maternal Grandmother    Heart attack Paternal Grandfather     ALLERGIES:  is allergic to axitinib, propofol, and glucotrol [glipizide].  MEDICATIONS:  Current Outpatient Medications  Medication Sig Dispense Refill   aspirin 81 MG EC tablet Take 81 mg by mouth daily with supper.     clobetasol ointment (TEMOVATE) 2.22 % Apply 1 application topically 2 (two) times daily as needed for rash.     cyclobenzaprine (FLEXERIL) 10 MG tablet Take 10 mg by mouth 3 (  three) times daily as needed for muscle spasms.     diphenoxylate-atropine (LOMOTIL) 2.5-0.025 MG tablet Take 1 tablet by mouth 4 (four) times daily as needed for diarrhea or loose stools. 60 tablet 1   Glucose Blood (ACCU-CHEK AVIVA PLUS VI) by In Vitro route.     JARDIANCE 10 MG TABS tablet TAKE 1 TABLET BY MOUTH DAILY 30 tablet 2   lisinopril (PRINIVIL,ZESTRIL) 10 MG tablet TAKE 1 TABLET(10 MG) BY MOUTH DAILY 90 tablet 1   metFORMIN (GLUCOPHAGE-XR) 500 MG 24 hr tablet Take 3 pills by mouth daily for 4-5 days, then increase to 4 pills daily (if tolerated) (Patient taking differently: Take 1,000 mg by mouth daily with supper.) 360 tablet 0   Multiple Vitamin (MULTIVITAMIN) tablet Take 1 tablet by mouth daily with supper.      OZEMPIC, 0.25 OR 0.5 MG/DOSE, 2 MG/1.5ML SOPN Inject 1 mg into the skin every Sunday.     pantoprazole (PROTONIX) 40 MG tablet Take 1 tablet (40 mg total) by mouth daily. 30 tablet 2   rosuvastatin (CRESTOR) 20 MG tablet Take 20 mg by mouth daily with supper.     ALPRAZolam (XANAX) 0.5 MG tablet Take 1 tablet (0.5 mg total) by mouth 2 (two) times daily as needed. 60 tablet 0   predniSONE (DELTASONE) 10 MG tablet Take 1 tablet (10 mg total) by mouth daily with breakfast. 30 tablet 0   No current facility-administered medications for this visit.     PHYSICAL EXAMINATION: ECOG PERFORMANCE STATUS: 1 - Symptomatic but completely ambulatory Vitals:   10/26/21 1342  BP: (!) 142/84  Pulse: (!) 108  Resp: 18  Temp: 97.7 F (36.5 C)  SpO2: 98%   Filed Weights   10/26/21 1342  Weight: 209 lb 14.4 oz (95.2 kg)    Physical Exam Constitutional:      General: He is not in acute distress. HENT:     Head: Normocephalic and atraumatic.  Eyes:     General: No scleral icterus.    Pupils: Pupils are equal, round, and reactive to light.  Cardiovascular:     Rate and Rhythm: Normal rate and regular rhythm.     Heart sounds: Normal heart sounds.  Pulmonary:     Effort: Pulmonary effort is normal. No respiratory distress.     Breath sounds: No wheezing.  Abdominal:     General: Bowel sounds are normal. There is no distension.     Palpations: Abdomen is soft. There is no mass.     Tenderness: There is no abdominal tenderness.  Musculoskeletal:        General: No deformity. Normal range of motion.     Cervical back: Normal range of motion and neck supple.  Skin:    General: Skin is warm and dry.     Findings: No erythema or rash.  Neurological:     Mental Status: He is alert and oriented to person, place, and time. Mental status is at baseline.     Cranial Nerves: No cranial nerve deficit.     Coordination: Coordination normal.  Psychiatric:        Mood and Affect: Mood normal.      LABORATORY DATA:  I have reviewed the data as listed Lab Results  Component Value Date   WBC 9.2 10/06/2021   HGB 14.1 10/06/2021   HCT 41.7 10/06/2021   MCV 90.8 10/06/2021   PLT 237 10/06/2021   Recent Labs    08/31/21 1239 09/07/21 1254 10/06/21 1407  NA 135 133* 134*  K 5.1 4.0 4.8  CL 101 103 101  CO2 '27 23 25  '$ GLUCOSE 368* 185* 265*  BUN 23* 16 21*  CREATININE 1.13 1.04 1.16  CALCIUM 8.9 8.6* 9.0  GFRNONAA >60 >60 >60  PROT 6.7 6.4* 6.8  ALBUMIN 3.9 3.5 4.1  AST '16 17 26  '$ ALT '16 15 25  '$ ALKPHOS 64 57 55  BILITOT 1.1 0.7 0.7    Iron/TIBC/Ferritin/ %Sat    Component Value Date/Time   IRON 118 09/25/2019 1115   TIBC 454 (H) 09/25/2019 1115   FERRITIN 236 09/25/2019 1115   IRONPCTSAT 26 09/25/2019 1115      RADIOGRAPHIC STUDIES: I have personally reviewed the radiological images as listed and agreed with the findings in the report.  CT CHEST ABDOMEN PELVIS WO CONTRAST  Result Date: 08/02/2021 CLINICAL DATA:  History of metastatic renal cell carcinoma, undergoing immunotherapy. * Tracking Code: BO * EXAM: CT CHEST, ABDOMEN AND PELVIS WITHOUT CONTRAST TECHNIQUE: Multidetector CT imaging of the chest, abdomen and pelvis was performed following the standard protocol without IV contrast. RADIATION DOSE REDUCTION: This exam was performed according to the departmental dose-optimization program which includes automated exposure control, adjustment of the mA and/or kV according to patient size and/or use of iterative reconstruction technique. COMPARISON:  Multiple priors including most recent CT April 15, 2021 FINDINGS: CT CHEST FINDINGS Cardiovascular: Aortic and branch vessel atherosclerosis without aneurysmal dilation. Normal caliber central pulmonary artery. Coronary artery calcifications. Normal size heart. No significant pericardial effusion/thickening. Mediastinum/Nodes: No discrete thyroid nodule. No pathologically enlarged mediastinal, hilar or axillary lymph  nodes, noting limited sensitivity for the detection of hilar adenopathy on this noncontrast study. Mild symmetric diffuse esophageal wall thickening is similar to prior examinations. Lungs/Pleura: Tiny 5 mm smaller bilateral upper lobe pulmonary nodules are stable over multiple prior examinations. No new suspicious pulmonary nodules or masses. No pleural effusion. No pneumothorax. Musculoskeletal: No aggressive lytic or blastic lesion of bone. CT ABDOMEN PELVIS FINDINGS Hepatobiliary: Unremarkable noncontrast appearance of the hepatic parenchyma. Gallbladder is unremarkable. No biliary ductal dilation. Pancreas: No pancreatic ductal dilation or evidence of acute inflammation. Spleen: No splenomegaly or focal splenic lesion. Adrenals/Urinary Tract: Bilateral adrenal glands appear normal. Right kidney surgically absent without suspicious new suspicious soft tissue nodularity in the nephrectomy bed. Small hypodense and hypodense left renal lesions remain stable and have been previously characterized as Bosniak classification 1 and 2 cysts on prior CT June 21, 2018. No new suspicious renal lesion on noncontrast examination. No hydronephrosis. Urinary bladder is unremarkable for degree of distension. Stomach/Bowel: Radiopaque enteric contrast material traverses distal loops of small bowel. The stomach is unremarkable for degree of distension. There is no pathologic dilation of small or large bowel. The appendix and terminal ileum appear normal. No evidence of acute bowel inflammation. Vascular/Lymphatic: Aortic and branch vessel atherosclerosis without abdominal aortic aneurysm. No pathologically enlarged abdominal or pelvic lymph nodes. Reproductive: Prostate is unremarkable. Other: No significant abdominopelvic free fluid. Musculoskeletal: Multilevel degenerative changes spine. No aggressive lytic or blastic lesion of bone. IMPRESSION: 1. Stable examination without evidence recurrent or metastatic disease in the  chest abdomen or pelvis. 2. Mild symmetric diffuse esophageal wall thickening, similar to prior examinations and suggestive of chronic esophagitis. 3.  Aortic Atherosclerosis (ICD10-I70.0). Electronically Signed   By: Dahlia Bailiff M.D.   On: 08/02/2021 16:21       ASSESSMENT & PLAN:  1. Clear cell carcinoma of right kidney ([place])   2. Chemotherapy induced diarrhea  3. Esophagitis   4. 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 -NED Hold immunotherapy due to diarrhea. Repeat CT chest abdomen pelvis for surveillance  #Barrett's esophagitis, recommend PPI.  Protonix prescription sent to pharmacy. #Immunotherapy induced diarrhea, negative C. difficile, negative GI panel.  + Lymphocytic colitis Continue Lomotil as needed. Slow taper prednisone.  Continue prednisone 10 mg daily.  #Anxiety/insomnia, improved.  Continue Xanax 0.5 mg twice daily as needed for anxiety.  Refills were sent.  Follow-up in 4 weeks All questions were answered. The patient knows to call the clinic with any problems questions or concerns.  Earlie Server, MD, PhD 10/26/2021

## 2021-11-02 ENCOUNTER — Encounter: Payer: Self-pay | Admitting: Gastroenterology

## 2021-11-02 ENCOUNTER — Ambulatory Visit (INDEPENDENT_AMBULATORY_CARE_PROVIDER_SITE_OTHER): Payer: 59 | Admitting: Gastroenterology

## 2021-11-02 VITALS — BP 145/83 | HR 86 | Temp 98.1°F | Wt 207.0 lb

## 2021-11-02 DIAGNOSIS — K2270 Barrett's esophagus without dysplasia: Secondary | ICD-10-CM

## 2021-11-02 DIAGNOSIS — K52832 Lymphocytic colitis: Secondary | ICD-10-CM | POA: Diagnosis not present

## 2021-11-02 NOTE — Progress Notes (Signed)
Primary Care Physician: Danae Orleans, MD  Primary Gastroenterologist:  Dr. [name]  Chief Complaint  Patient presents with   Follow-up    Pathology results    HPI: Matthew Woodard is a 56 y.o. male here for follow-up of an EGD and colonoscopy.  The patient had the EGD and colonoscopy due to diarrhea and a CT scan that showed thickening of the esophagus.  Biopsies throughout the GI tract examined showed:  DIAGNOSIS:  A.  SMALL BOWEL; COLD BIOPSY:  - REACTIVE DUODENITIS, SEE COMMENT.  - IHC FOR CMV IS NEGATIVE.  - NEGATIVE FOR DYSPLASIA AND MALIGNANCY.   B.  GE J; COLD BIOPSY:  - REFLUX GASTROESOPHAGITIS WITH INTESTINAL METAPLASIA, SEE COMMENT.  - NEGATIVE FOR DYSPLASIA AND MALIGNANCY.   C.  TERMINAL ILEUM; COLD BIOPSY:  - UNREMARKABLE SMALL INTESTINAL MUCOSA.  - NEGATIVE FOR DYSPLASIA AND MALIGNANCY.   D.  COLON, RANDOM; COLD BIOPSY:  - COLONIC MUCOSA WITH INTRAEPITHELIAL LYMPHOCYTOSIS CONSISTENT WITH  LYMPHOCYTIC COLITIS, SEE COMMENT.  - NEGATIVE FOR DYSPLASIA AND MALIGNANCY.   E.  COLON POLYP, SIGMOID; COLD BIOPSY:  - TUBULAR ADENOMA.  - NEGATIVE FOR HIGH-GRADE DYSPLASIA AND MALIGNANCY.   The irregular mucosa in the esophagus was 4 cm long and was consistent with Barrett's esophagus.  The patient has been taking Lomotil for his diarrhea.  The patient states that since the colonoscopy he has had only 1 bowel movement a day and he states that it is soft with some solid.  The patient also reports that he is on steroids by his oncologist.  Past Medical History:  Diagnosis Date   Arthritis    hands, left knee   Cancer ([place])    Diabetes mellitus without complication ([place])    Eczema 01/08/2017   GERD (gastroesophageal reflux disease)    History of kidney cancer    Hyperlipidemia LDL goal <100 11/02/2014   Hypertension    Kidney stone    YEAR H/O HEMATURIA   Psoriasis    Wears dentures    full upper, partial lower.  Has, does not wear    Current  Outpatient Medications  Medication Sig Dispense Refill   ALPRAZolam (XANAX) 0.5 MG tablet Take 1 tablet (0.5 mg total) by mouth 2 (two) times daily as needed. 60 tablet 0   aspirin 81 MG EC tablet Take 81 mg by mouth daily with supper.     clobetasol ointment (TEMOVATE) 5.17 % Apply 1 application topically 2 (two) times daily as needed for rash.     cyclobenzaprine (FLEXERIL) 10 MG tablet Take 10 mg by mouth 3 (three) times daily as needed for muscle spasms.     diphenoxylate-atropine (LOMOTIL) 2.5-0.025 MG tablet Take 1 tablet by mouth 4 (four) times daily as needed for diarrhea or loose stools. 60 tablet 1   Glucose Blood (ACCU-CHEK AVIVA PLUS VI) by In Vitro route.     JARDIANCE 10 MG TABS tablet TAKE 1 TABLET BY MOUTH DAILY 30 tablet 2   lisinopril (PRINIVIL,ZESTRIL) 10 MG tablet TAKE 1 TABLET(10 MG) BY MOUTH DAILY 90 tablet 1   metFORMIN (GLUCOPHAGE-XR) 500 MG 24 hr tablet Take 3 pills by mouth daily for 4-5 days, then increase to 4 pills daily (if tolerated) (Patient taking differently: Take 1,000 mg by mouth daily with supper.) 360 tablet 0   Multiple Vitamin (MULTIVITAMIN) tablet Take 1 tablet by mouth daily with supper.     OZEMPIC, 0.25 OR 0.5 MG/DOSE, 2 MG/1.5ML SOPN Inject 1 mg into  the skin every Sunday.     pantoprazole (PROTONIX) 40 MG tablet Take 1 tablet (40 mg total) by mouth daily. 30 tablet 2   predniSONE (DELTASONE) 10 MG tablet Take 1 tablet (10 mg total) by mouth daily with breakfast. 30 tablet 0   rosuvastatin (CRESTOR) 20 MG tablet Take 20 mg by mouth daily with supper.     No current facility-administered medications for this visit.    Allergies as of 11/02/2021 - Review Complete 11/02/2021  Allergen Reaction Noted   Axitinib  07/29/2020   Propofol Hives 10/24/2021   Glucotrol [glipizide] Other (See Comments) 10/22/2014    ROS:  General: Negative for anorexia, weight loss, fever, chills, fatigue, weakness. ENT: Negative for hoarseness, difficulty swallowing ,  nasal congestion. CV: Negative for chest pain, angina, palpitations, dyspnea on exertion, peripheral edema.  Respiratory: Negative for dyspnea at rest, dyspnea on exertion, cough, sputum, wheezing.  GI: See history of present illness. GU:  Negative for dysuria, hematuria, urinary incontinence, urinary frequency, nocturnal urination.  Endo: Negative for unusual weight change.    Physical Examination:   BP (!) 145/83   Pulse 86   Temp 98.1 F (36.7 C) (Oral)   Wt 207 lb (93.9 kg)   BMI 28.07 kg/m   General: Well-nourished, well-developed in no acute distress.  Eyes: No icterus. Conjunctivae pink. Neuro: Alert and oriented x 3.  Grossly intact. Psych: Alert and cooperative, normal mood and affect.  Labs:    Imaging Studies: No results found.  Assessment and Plan:   Matthew Woodard is a 56 y.o. y/o male who comes in today with a finding on an upper endoscopy and colonoscopy of Barrett's esophagus with lymphocytic colitis.  The patient has already been started on a PPI by his oncologist and the patient also has been doing much better since the colonoscopy and not taking Lomotil.  The patient has been told that since he seems to be under remission from his lymphocytic colitis he should contact me if the diarrhea returns and we can start him on budesonide at that time.  The patient has been explained the plan agrees with it.     [name], MD. [facility] Regal    Note: This dictation was prepared with Dragon dictation along with smaller phrase technology. Any transcriptional errors that result from this process are unintentional.

## 2021-11-16 ENCOUNTER — Ambulatory Visit
Admission: RE | Admit: 2021-11-16 | Discharge: 2021-11-16 | Disposition: A | Discharge status: Home / Self Care | Payer: 59 | Source: Ambulatory Visit | Attending: Oncology | Admitting: Oncology

## 2021-11-16 DIAGNOSIS — C641 Malignant neoplasm of right kidney, except renal pelvis: Secondary | ICD-10-CM | POA: Insufficient documentation

## 2021-11-23 ENCOUNTER — Encounter: Payer: Self-pay | Admitting: Oncology

## 2021-11-23 ENCOUNTER — Inpatient Hospital Stay: Payer: 59 | Attending: Oncology

## 2021-11-23 ENCOUNTER — Inpatient Hospital Stay (HOSPITAL_BASED_OUTPATIENT_CLINIC_OR_DEPARTMENT_OTHER): Payer: 59 | Admitting: Oncology

## 2021-11-23 VITALS — BP 123/84 | HR 110 | Temp 96.0°F | Resp 16 | Ht 72.0 in | Wt 204.0 lb

## 2021-11-23 DIAGNOSIS — Z96653 Presence of artificial knee joint, bilateral: Secondary | ICD-10-CM | POA: Insufficient documentation

## 2021-11-23 DIAGNOSIS — Z87891 Personal history of nicotine dependence: Secondary | ICD-10-CM | POA: Diagnosis not present

## 2021-11-23 DIAGNOSIS — Z905 Acquired absence of kidney: Secondary | ICD-10-CM | POA: Diagnosis not present

## 2021-11-23 DIAGNOSIS — C7800 Secondary malignant neoplasm of unspecified lung: Secondary | ICD-10-CM | POA: Insufficient documentation

## 2021-11-23 DIAGNOSIS — K521 Toxic gastroenteritis and colitis: Secondary | ICD-10-CM

## 2021-11-23 DIAGNOSIS — K2100 Gastro-esophageal reflux disease with esophagitis, without bleeding: Secondary | ICD-10-CM | POA: Insufficient documentation

## 2021-11-23 DIAGNOSIS — K209 Esophagitis, unspecified without bleeding: Secondary | ICD-10-CM

## 2021-11-23 DIAGNOSIS — Z79899 Other long term (current) drug therapy: Secondary | ICD-10-CM | POA: Insufficient documentation

## 2021-11-23 DIAGNOSIS — C641 Malignant neoplasm of right kidney, except renal pelvis: Secondary | ICD-10-CM | POA: Diagnosis not present

## 2021-11-23 DIAGNOSIS — T451X5A Adverse effect of antineoplastic and immunosuppressive drugs, initial encounter: Secondary | ICD-10-CM

## 2021-11-23 DIAGNOSIS — K2270 Barrett's esophagus without dysplasia: Secondary | ICD-10-CM | POA: Diagnosis not present

## 2021-11-23 DIAGNOSIS — F419 Anxiety disorder, unspecified: Secondary | ICD-10-CM | POA: Insufficient documentation

## 2021-11-23 DIAGNOSIS — G4700 Insomnia, unspecified: Secondary | ICD-10-CM | POA: Insufficient documentation

## 2021-11-23 DIAGNOSIS — C7911 Secondary malignant neoplasm of bladder: Secondary | ICD-10-CM | POA: Diagnosis not present

## 2021-11-23 LAB — COMPREHENSIVE METABOLIC PANEL
ALT: 24 U/L (ref 0–44)
AST: 26 U/L (ref 15–41)
Albumin: 4.5 g/dL (ref 3.5–5.0)
Alkaline Phosphatase: 66 U/L (ref 38–126)
Anion gap: 11 (ref 5–15)
BUN: 27 mg/dL — ABNORMAL HIGH (ref 6–20)
CO2: 24 mmol/L (ref 22–32)
Calcium: 9.4 mg/dL (ref 8.9–10.3)
Chloride: 101 mmol/L (ref 98–111)
Creatinine, Ser: 1.34 mg/dL — ABNORMAL HIGH (ref 0.61–1.24)
GFR, Estimated: >60 mL/min (ref >60)
Glucose, Bld: 262 mg/dL — ABNORMAL HIGH (ref 70–99)
Potassium: 4.7 mmol/L (ref 3.5–5.1)
Sodium: 136 mmol/L (ref 135–145)
Total Bilirubin: 1 mg/dL (ref 0.3–1.2)
Total Protein: 7.3 g/dL (ref 6.5–8.1)

## 2021-11-23 LAB — CBC WITH DIFFERENTIAL/PLATELET
Abs Immature Granulocytes: 0.1 10*3/uL — ABNORMAL HIGH (ref 0.00–0.07)
Basophils Absolute: 0 10*3/uL (ref 0.0–0.1)
Basophils Relative: 0 %
Eosinophils Absolute: 0 10*3/uL (ref 0.0–0.5)
Eosinophils Relative: 0 %
HCT: 43.8 % (ref 39.0–52.0)
Hemoglobin: 14.5 g/dL (ref 13.0–17.0)
Immature Granulocytes: 1 %
Lymphocytes Relative: 9 %
Lymphs Abs: 0.8 10*3/uL (ref 0.7–4.0)
MCH: 30.9 pg (ref 26.0–34.0)
MCHC: 33.1 g/dL (ref 30.0–36.0)
MCV: 93.4 fL (ref 80.0–100.0)
Monocytes Absolute: 0.4 10*3/uL (ref 0.1–1.0)
Monocytes Relative: 4 %
Neutro Abs: 8.5 10*3/uL — ABNORMAL HIGH (ref 1.7–7.7)
Neutrophils Relative %: 86 %
Platelets: 238 10*3/uL (ref 150–400)
RBC: 4.69 MIL/uL (ref 4.22–5.81)
RDW: 13 % (ref 11.5–15.5)
WBC: 9.9 10*3/uL (ref 4.0–10.5)
nRBC: 0 % (ref 0.0–0.2)

## 2021-11-23 MED ORDER — PREDNISONE 1 MG PO TABS: 1.0000 mg | ORAL_TABLET | ORAL | 0 refills | Status: DC

## 2021-11-23 NOTE — Progress Notes (Signed)
Hematology/Oncology Progress note [phone] [phone]      Patient Care Team: Danae Orleans, MD as PCP - General (Internal Medicine) [name], [name], MD as Consulting Physician (Dermatology) Earlie Server, MD as Consulting Physician (Oncology)  REFERRING PROVIDER: Danae Orleans, MD  CHIEF COMPLAINTS/REASON FOR VISIT:  Follow-up of kidney cancer  HISTORY OF PRESENTING ILLNESS:   Matthew Woodard is a  56 y.o.  male with PMH listed below was seen in consultation at the request of  Danae Orleans, MD  for evaluation of kidney cancer Patient's cancer history dated back to February 2020 when he developed gross hematuria with small clots and right-sided flank pain.  Patient had a CT urogram done which showed right 4 cm central enhancing renal mass with invasion into the collecting system.  X-ray of chest was performed to see complete staging which showed no concerning findings. 06/28/2018 patient underwent a cystoscopy, bladder biopsy and a right retrograde pyelogram with intraoperative interpretation, right diagnostic ureteroscopy, right renal pelvis biopsy, right ureteral stent placement.  biopsy showed atypical cell clusters, with extensive crush artifact, nondiagnostic.   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  Surgical margins negative for tumor.pT3a,Nx Patient has been on surveillance after surgery. 10/28/2018 CT abdomen pelvis with contrast showed minimal fluid and or postoperative changes within the right renal fossa.  No evidence of metastatic disease or recurrence.  Patient has left kidney lesion previously characterized as nonenhancing cyst by multi phasic contrast-enhanced CT. Patient reports an episode of gross hematuria in early December 2020, patient is due for 12-month surveillance CT scan. 04/30/2019 CT abdomen pelvis with contrast showed high attenuation mass at the right uretero vesicle junction, with extension into the bladder.  Bilateral pulmonary nodules, most indicated for metastatic disease.   Patient underwent cystoscopy and transurethral resection of irregular bladder tumor 3 cm.  Mass appeared to emanate from the right ureteral orifice.  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 Patient was referred to me for further work-up and management.  #Staging chest CT with contrast showed numerous bilateral pulmonary nodules.  Compatible with metastatic disease. Bone scan showed no osseous metastatic disease.   #History of tobacco abuse, former smoker.  Quit in 2015.  He denies any hemoptysis, chest pain, shortness of breath today. Patient reports that hematuria has completely resolved.  Denies any pain today. He is accompanied by his wife.  #History of cutaneous psoriasis, mostly on his right hand, currently on weekly methotrexate 12.5 mg with good symptom control. #NGS: [facility] PD L1 TPS 1%  # patient was referred to ENT for evaluation of headache and sinus pressure.  He had a ENT evaluation and feels that patient does not have sinusitis.  [name] recommended MRI brain to rule out PRES syndrome.  Axitinib has been held MRI brain was done and was negative.   #09/2019 off axitinib and Keytruda since the beginning of May due to transaminitis. Patient was seen and evaluated by gastroenterology Dr. AVicente Males Work-up was negative. Ultrasound liver vascular Doppler is negative. Recommend observation. #11/24/2019, resumed on Keytruda every 3 weeks #01/05/2020, axitinib was resumed 3 mg #01/20/2020, axitinib was discontinued due to recurrence of transaminitis  KBeryle Flockwas temporarily held. #03/16/2020, resumed on Keytruda every 3 weeks. 09/29/2020 CT scan showed no evidence of local recurrence or new/progressive disease  in the chest abdomen or  pelvis.  Tiny nonspecific lung nodules are unchanged.  No new lung nodules. May 2022, left knee replacement 01/14/2021 CT chest abdomen pelvis showed stable exam, no new or progressive metastatic disease within chest abdomen pelvis. Stable lung nodules. Aortic atherosclerosis.  Sept 2022 right knee replacement 04/15/2021, CT chest abdomen pelvis with contrast showed stable examination.  No evidence of recurrent or metastatic carcinoma within the chest abdomen pelvis.  08/02/2021, CT chest abdomen pelvis without contrast, stable examination without evidence of recurrence or metastatic disease.  Mild asymmetric diffuse esophageal wall thickening, similar to prior examination.  Likely chronic esophagitis.  Aortic atherosclerosis.  10/21/2021, colonoscopy showed 3 mm polyp which was resected and retrieved.  Negative for dysplasia/malignancy.  Random biopsy showed lymphocytic colitis. EGD showed long segment Barrett's esophagus.  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  Negative for dysplasia and malignancy.  Small bowel cold biopsy showed reactive duodenitis.  Negative for CMV.  Negative for dysplasia or malignancy.   INTERVAL HISTORY Matthew Woodard is a 56 y.o. male who has above history reviewed by me today presents for follow up visit for management of metastatic RCC. Diarrhea has resolved.  Stool is formed.  Patient is currently prednisone 10 mg daily. Also on PPI for esophagitis. No new complaints. Review of Systems  Constitutional:  Negative for appetite change, chills, fatigue, fever and unexpected weight change.  HENT:   Negative for hearing loss and voice change.   Eyes:  Negative for eye problems and icterus.  Respiratory:  Negative for chest tightness, cough and shortness of breath.   Cardiovascular:  Negative for chest pain and leg swelling.  Gastrointestinal:  Negative for abdominal distention, abdominal pain, blood in  stool, diarrhea and nausea.  Endocrine: Negative for hot flashes.  Genitourinary:  Negative for difficulty urinating, dysuria and frequency.   Musculoskeletal:  Positive for arthralgias.  Skin:  Negative for itching and rash.  Neurological:  Negative for extremity weakness, headaches, light-headedness and numbness.  Hematological:  Negative for adenopathy. Does not bruise/bleed easily.  Psychiatric/Behavioral:  Negative for confusion.     MEDICAL HISTORY:  Past Medical History:  Diagnosis Date   Arthritis    hands, left knee   Cancer ([place])    Diabetes mellitus without complication ([place])    Eczema 01/08/2017   GERD (gastroesophageal reflux disease)    History of kidney cancer    Hyperlipidemia LDL goal <100 11/02/2014   Hypertension    Kidney stone    YEAR H/O HEMATURIA   Psoriasis    Wears dentures    full upper, partial lower.  Has, does not wear    SURGICAL HISTORY: Past Surgical History:  Procedure Laterality Date   COLONOSCOPY N/A 10/21/2021   Procedure: COLONOSCOPY;  Surgeon: [name], MD;  Location: [place];  Service: Endoscopy;  Laterality: N/A;   COLONOSCOPY WITH PROPOFOL N/A 02/23/2017   Procedure: COLONOSCOPY WITH PROPOFOL;  Surgeon: [name], MD;  Location: [place];  Service: Endoscopy;  Laterality: N/A;  Diabetic - oral meds   CYST EXCISION  Aug. 2016   on back   CYSTOSCOPY W/ RETROGRADES Right 06/28/2018   Procedure: CYSTOSCOPY WITH RETROGRADE PYELOGRAM;  Surgeon: Billey Co, MD;  Location: [facility];  Service: Urology;  Laterality: Right;   CYSTOSCOPY W/ URETERAL STENT REMOVAL Right 07/22/2018   Procedure: CYSTOSCOPY WITH STENT REMOVAL;  Surgeon: Billey Co, MD;  Location: [facility];  Service: Urology;  Laterality: Right;   CYSTOSCOPY WITH BIOPSY  Right 06/28/2018   Procedure: CYSTOSCOPY WITH RENAL BIOPSY;  Surgeon: Billey Co, MD;  Location: [facility];  Service: Urology;  Laterality: Right;   CYSTOSCOPY WITH URETEROSCOPY  AND STENT PLACEMENT Right 06/28/2018   Procedure: CYSTOSCOPY WITH URETEROSCOPY AND STENT PLACEMENT;  Surgeon: Billey Co, MD;  Location: [facility];  Service: Urology;  Laterality: Right;   ESOPHAGOGASTRODUODENOSCOPY N/A 10/21/2021   Procedure: ESOPHAGOGASTRODUODENOSCOPY (EGD);  Surgeon: [name], MD;  Location: [place];  Service: Endoscopy;  Laterality: N/A;   FULGURATION OF BLADDER TUMOR N/A 06/28/2018   Procedure: CYSTOSCOPY BLADDER BIOPSY, FULGERATION OF BLADDER;  Surgeon: Billey Co, MD;  Location: [facility];  Service: Urology;  Laterality: N/A;   KNEE SURGERY Left    LAPAROSCOPIC NEPHRECTOMY, HAND ASSISTED Right 07/22/2018   Procedure: HAND ASSISTED LAPAROSCOPIC NEPHRECTOMY;  Surgeon: Billey Co, MD;  Location: [facility];  Service: Urology;  Laterality: Right;   NASAL SINUS SURGERY  03/15   POLYPECTOMY N/A 02/23/2017   Procedure: POLYPECTOMY;  Surgeon: [name], MD;  Location: [place];  Service: Endoscopy;  Laterality: N/A;   POLYPECTOMY  10/21/2021   Procedure: POLYPECTOMY;  Surgeon: [name], MD;  Location: [place];  Service: Endoscopy;;   tooth abstraction     TRANSURETHRAL RESECTION OF BLADDER TUMOR N/A 05/12/2019   Procedure: TRANSURETHRAL RESECTION OF BLADDER TUMOR (TURBT);  Surgeon: Billey Co, MD;  Location: [facility];  Service: Urology;  Laterality: N/A;   VARICOCELE EXCISION     XI ROBOTIC ASSISTED VENTRAL HERNIA N/A 11/18/2020   Procedure: XI ROBOTIC ASSISTED VENTRAL HERNIA, incisional;  Surgeon: Jules Husbands, MD;  Location: [facility];  Service: General;  Laterality: N/A;    SOCIAL HISTORY: Social History   Socioeconomic History   Marital status: Married    Spouse name: Alma Friendly   Number of children: Not on file   Years of education: Not on file   Highest education level: Not on file  Occupational History   Not on file  Tobacco Use   Smoking status: Former    Types: Cigarettes    Quit date: 06/15/2013    Years  since quitting: 8.4   Smokeless tobacco: Never  Vaping Use   Vaping Use: Never used  Substance and Sexual Activity   Alcohol use: No    Alcohol/week: 0.0 standard drinks of alcohol   Drug use: No   Sexual activity: Yes  Other Topics Concern   Not on file  Social History Narrative   Lives with wife, sister in law   Social Determinants of Health   Financial Resource Strain: Not on file  Food Insecurity: Not on file  Transportation Needs: Not on file  Physical Activity: Not on file  Stress: Not on file  Social Connections: Not on file  Intimate Partner Violence: Not on file    FAMILY HISTORY: Family History  Problem Relation Age of Onset   Heart disease Father    Hypertension Father    COPD Father    Colon cancer Maternal Grandmother    Heart attack Paternal Grandfather     ALLERGIES:  is allergic to axitinib, propofol, and glucotrol [glipizide].  MEDICATIONS:  Current Outpatient Medications  Medication Sig Dispense Refill   ALPRAZolam (XANAX) 0.5 MG tablet Take 1 tablet (0.5 mg total) by mouth 2 (two) times daily as needed. 60 tablet 0   aspirin 81 MG EC tablet Take 81 mg by mouth daily with supper.     clobetasol ointment (TEMOVATE) 0.05 %  Apply 1 application topically 2 (two) times daily as needed for rash.     cyclobenzaprine (FLEXERIL) 10 MG tablet Take 10 mg by mouth 3 (three) times daily as needed for muscle spasms.     Glucose Blood (ACCU-CHEK AVIVA PLUS VI) by In Vitro route.     JARDIANCE 10 MG TABS tablet TAKE 1 TABLET BY MOUTH DAILY 30 tablet 2   lisinopril (PRINIVIL,ZESTRIL) 10 MG tablet TAKE 1 TABLET(10 MG) BY MOUTH DAILY 90 tablet 1   metFORMIN (GLUCOPHAGE-XR) 500 MG 24 hr tablet Take 3 pills by mouth daily for 4-5 days, then increase to 4 pills daily (if tolerated) (Patient taking differently: Take 1,000 mg by mouth daily with supper.) 360 tablet 0   Multiple Vitamin (MULTIVITAMIN) tablet Take 1 tablet by mouth daily with supper.     OZEMPIC, 0.25 OR 0.5  MG/DOSE, 2 MG/1.5ML SOPN Inject 1 mg into the skin every Sunday.     pantoprazole (PROTONIX) 40 MG tablet Take 1 tablet (40 mg total) by mouth daily. 30 tablet 2   predniSONE (DELTASONE) 1 MG tablet Take 1 tablet (1 mg total) by mouth See admin instructions. Take '5mg'$  daily for 2 weeks, then take '3mg'$  daily for 2 weeks. 120 tablet 0   rosuvastatin (CRESTOR) 20 MG tablet Take 20 mg by mouth daily with supper.     diphenoxylate-atropine (LOMOTIL) 2.5-0.025 MG tablet Take 1 tablet by mouth 4 (four) times daily as needed for diarrhea or loose stools. (Patient not taking: Reported on 11/23/2021) 60 tablet 1   No current facility-administered medications for this visit.     PHYSICAL EXAMINATION: ECOG PERFORMANCE STATUS: 1 - Symptomatic but completely ambulatory Vitals:   11/23/21 1310  BP: 123/84  Pulse: (!) 110  Resp: 16  Temp: (!) 96 F (35.6 C)  SpO2: 98%   Filed Weights   11/23/21 1310  Weight: 204 lb (92.5 kg)    Physical Exam Constitutional:      General: He is not in acute distress. HENT:     Head: Normocephalic and atraumatic.  Eyes:     General: No scleral icterus.    Pupils: Pupils are equal, round, and reactive to light.  Cardiovascular:     Rate and Rhythm: Normal rate and regular rhythm.     Heart sounds: Normal heart sounds.  Pulmonary:     Effort: Pulmonary effort is normal. No respiratory distress.     Breath sounds: No wheezing.  Abdominal:     General: Bowel sounds are normal. There is no distension.     Palpations: Abdomen is soft. There is no mass.     Tenderness: There is no abdominal tenderness.  Musculoskeletal:        General: No deformity. Normal range of motion.     Cervical back: Normal range of motion and neck supple.  Skin:    General: Skin is warm and dry.     Findings: No erythema or rash.  Neurological:     Mental Status: He is alert and oriented to person, place, and time. Mental status is at baseline.     Cranial Nerves: No cranial nerve  deficit.     Coordination: Coordination normal.  Psychiatric:        Mood and Affect: Mood normal.     LABORATORY DATA:  I have reviewed the data as listed Lab Results  Component Value Date   WBC 9.9 11/23/2021   HGB 14.5 11/23/2021   HCT 43.8 11/23/2021   MCV 93.4  11/23/2021   PLT 238 11/23/2021   Recent Labs    09/07/21 1254 10/06/21 1407 11/23/21 1304  NA 133* 134* 136  K 4.0 4.8 4.7  CL 103 101 101  CO2 '23 25 24  '$ GLUCOSE 185* 265* 262*  BUN 16 21* 27*  CREATININE 1.04 1.16 1.34*  CALCIUM 8.6* 9.0 9.4  GFRNONAA >60 >60 >60  PROT 6.4* 6.8 7.3  ALBUMIN 3.5 4.1 4.5  AST '17 26 26  '$ ALT '15 25 24  '$ ALKPHOS 57 55 66  BILITOT 0.7 0.7 1.0    Iron/TIBC/Ferritin/ %Sat    Component Value Date/Time   IRON 118 09/25/2019 1115   TIBC 454 (H) 09/25/2019 1115   FERRITIN 236 09/25/2019 1115   IRONPCTSAT 26 09/25/2019 1115      RADIOGRAPHIC STUDIES: I have personally reviewed the radiological images as listed and agreed with the findings in the report.  CT CHEST ABDOMEN PELVIS WO CONTRAST  Result Date: 11/16/2021 CLINICAL DATA:  History of metastatic renal cell carcinoma undergoing immunotherapy, follow-up/restaging. * Tracking Code: BO * EXAM: CT CHEST, ABDOMEN AND PELVIS WITHOUT CONTRAST TECHNIQUE: Multidetector CT imaging of the chest, abdomen and pelvis was performed following the standard protocol without IV contrast. RADIATION DOSE REDUCTION: This exam was performed according to the departmental dose-optimization program which includes automated exposure control, adjustment of the mA and/or kV according to patient size and/or use of iterative reconstruction technique. COMPARISON:  Multiple priors including most recent CT August 02, 2021 FINDINGS: CT CHEST FINDINGS Cardiovascular: Aortic and branch vessel atherosclerosis without thoracic aortic aneurysm. Coronary artery calcifications. Normal size heart. No significant pericardial effusion/thickening. Mediastinum/Nodes: No  discrete thyroid nodule. No pathologically enlarged mediastinal, hilar or axillary lymph nodes, noting limited sensitivity for the detection of hilar adenopathy on this noncontrast study. Retained versus refluxed contrast in the esophagus with similar mild symmetric distal esophageal wall thickening. Lungs/Pleura: Tiny 4 mm or smaller bilateral pulmonary nodules are unchanged over multiple prior examinations for instance measuring 3 mm in the right lung apex on image 30/3. No new suspicious pulmonary nodules or masses. Scarring versus atelectasis in the right middle lobe. No pleural effusion. No pneumothorax. Musculoskeletal: No aggressive lytic or blastic lesion of bone. Multilevel degenerative changes spine. CT ABDOMEN PELVIS FINDINGS Hepatobiliary: No suspicious hepatic lesion on this noncontrast examination. Gallbladder is decompressed. No biliary ductal dilation. Pancreas: No pancreatic ductal dilation or evidence of acute inflammation. Spleen: No splenomegaly or focal splenic lesion. Adrenals/Urinary Tract: Bilateral adrenal glands are within normal limits. Right kidney is surgically absent without new soft tissue nodularity in the nephrectomy bed. No left-sided hydronephrosis. Small hyper and hypodense left renal lesions are incompletely evaluated on this noncontrast examination but appear similar over multiple prior examinations dating back to June 21, 2018, most consistent with benign Bosniak classification 1 and 2 renal cysts, which in the absence of clinically indicated signs/symptoms require no independent imaging follow-up. Urinary bladder is unremarkable for degree of distension. Stomach/Bowel: Radiopaque enteric contrast material traverses distal loops of small bowel. Stomach is unremarkable for degree of distension. No pathologic dilation of small or large bowel. The appendix and terminal ileum appear normal. Sigmoid colonic diverticulosis without findings of acute diverticulitis. No evidence of  acute bowel inflammation. Vascular/Lymphatic: Aortic and branch vessel atherosclerosis without abdominal aortic aneurysm. No pathologically enlarged abdominal or pelvic lymph nodes. Reproductive: Dystrophic prostatic calcifications. Trace bilateral hydroceles. Other: No significant abdominopelvic free fluid. Musculoskeletal: No aggressive lytic or blastic lesion of bone. Multilevel degenerative changes spine. IMPRESSION: 1. Stable examination  without evidence of local recurrence or active metastatic disease in the chest, abdomen or pelvis. 2. Reflux/retained contrast in the esophagus with similar mild symmetric esophageal wall thickening suggestive of esophagitis. 3.  Aortic Atherosclerosis (ICD10-I70.0). Electronically Signed   By: Dahlia Bailiff M.D.   On: 11/16/2021 09:58       ASSESSMENT & PLAN:  1. Clear cell carcinoma of right kidney ([place])   2. Chemotherapy induced diarrhea   3. Esophagitis   4. 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 -NED Hold immunotherapy due to diarrhea. 11/16/2021, CT chest abdomen pelvis without contrast showed stable examination without evidence of local recurrence or active malignant disease in the chest abdomen pelvis.  Reflux retained contrast in the esophagus suggestive of esophagitis.  Aortic atherosclerosis   #Barrett's esophagitis, continue Protonix  #Immunotherapy induced diarrhea, negative C. difficile, negative GI panel.  + Lymphocytic colitis Improved. Slow taper prednisone.   Recommend patient to taper down to 5 mg daily for 2 weeks, followed by 3 mg daily for 2 weeks.  #Anxiety/insomnia, improved.  Continue Xanax 0.5 mg twice daily as needed for anxiety.   Follow-up in 4 weeks All questions were answered. The patient knows to call the clinic with any problems questions or concerns.  Earlie Server, MD, PhD 11/23/2021

## 2021-12-01 ENCOUNTER — Encounter: Payer: Self-pay | Admitting: Oncology

## 2021-12-01 ENCOUNTER — Encounter: Payer: Self-pay | Admitting: Gastroenterology

## 2021-12-01 MED ORDER — BUDESONIDE 3 MG PO CPEP: 9.0000 mg | ORAL_CAPSULE | Freq: Every day | ORAL | 1 refills | Status: DC

## 2021-12-01 NOTE — Telephone Encounter (Signed)
Last OV note stated   "The patient has been told that since he seems to be under remission from his lymphocytic colitis he should contact me if the diarrhea returns and we can start him on budesonide at that time."  Rx sent through e-scribe Budesonide '9mg'$  QD x 8 weeks

## 2021-12-05 ENCOUNTER — Other Ambulatory Visit: Payer: Self-pay

## 2021-12-13 ENCOUNTER — Other Ambulatory Visit: Payer: Self-pay

## 2021-12-17 ENCOUNTER — Other Ambulatory Visit: Payer: Self-pay

## 2021-12-21 ENCOUNTER — Inpatient Hospital Stay: Payer: 59 | Attending: Oncology

## 2021-12-21 ENCOUNTER — Inpatient Hospital Stay (HOSPITAL_BASED_OUTPATIENT_CLINIC_OR_DEPARTMENT_OTHER): Payer: 59 | Admitting: Oncology

## 2021-12-21 ENCOUNTER — Encounter: Payer: Self-pay | Admitting: Oncology

## 2021-12-21 VITALS — BP 127/76 | HR 101 | Temp 96.7°F | Resp 18 | Wt 209.4 lb

## 2021-12-21 DIAGNOSIS — K521 Toxic gastroenteritis and colitis: Secondary | ICD-10-CM | POA: Diagnosis not present

## 2021-12-21 DIAGNOSIS — F419 Anxiety disorder, unspecified: Secondary | ICD-10-CM

## 2021-12-21 DIAGNOSIS — T451X5A Adverse effect of antineoplastic and immunosuppressive drugs, initial encounter: Secondary | ICD-10-CM

## 2021-12-21 DIAGNOSIS — Z87891 Personal history of nicotine dependence: Secondary | ICD-10-CM | POA: Insufficient documentation

## 2021-12-21 DIAGNOSIS — C641 Malignant neoplasm of right kidney, except renal pelvis: Secondary | ICD-10-CM | POA: Insufficient documentation

## 2021-12-21 DIAGNOSIS — Z905 Acquired absence of kidney: Secondary | ICD-10-CM | POA: Insufficient documentation

## 2021-12-21 DIAGNOSIS — Z79899 Other long term (current) drug therapy: Secondary | ICD-10-CM | POA: Diagnosis not present

## 2021-12-21 DIAGNOSIS — C7911 Secondary malignant neoplasm of bladder: Secondary | ICD-10-CM | POA: Insufficient documentation

## 2021-12-21 DIAGNOSIS — K209 Esophagitis, unspecified without bleeding: Secondary | ICD-10-CM | POA: Diagnosis not present

## 2021-12-21 DIAGNOSIS — C7800 Secondary malignant neoplasm of unspecified lung: Secondary | ICD-10-CM | POA: Insufficient documentation

## 2021-12-21 DIAGNOSIS — N281 Cyst of kidney, acquired: Secondary | ICD-10-CM

## 2021-12-21 LAB — COMPREHENSIVE METABOLIC PANEL
ALT: 21 U/L (ref 0–44)
AST: 26 U/L (ref 15–41)
Albumin: 4.6 g/dL (ref 3.5–5.0)
Alkaline Phosphatase: 63 U/L (ref 38–126)
Anion gap: 8 (ref 5–15)
BUN: 30 mg/dL — ABNORMAL HIGH (ref 6–20)
CO2: 25 mmol/L (ref 22–32)
Calcium: 10.1 mg/dL (ref 8.9–10.3)
Chloride: 104 mmol/L (ref 98–111)
Creatinine, Ser: 1.37 mg/dL — ABNORMAL HIGH (ref 0.61–1.24)
GFR, Estimated: >60 mL/min (ref >60)
Glucose, Bld: 130 mg/dL — ABNORMAL HIGH (ref 70–99)
Potassium: 4.6 mmol/L (ref 3.5–5.1)
Sodium: 137 mmol/L (ref 135–145)
Total Bilirubin: 0.5 mg/dL (ref 0.3–1.2)
Total Protein: 7.3 g/dL (ref 6.5–8.1)

## 2021-12-21 LAB — CBC WITH DIFFERENTIAL/PLATELET
Abs Immature Granulocytes: 0.08 10*3/uL — ABNORMAL HIGH (ref 0.00–0.07)
Basophils Absolute: 0 10*3/uL (ref 0.0–0.1)
Basophils Relative: 0 %
Eosinophils Absolute: 0.1 10*3/uL (ref 0.0–0.5)
Eosinophils Relative: 1 %
HCT: 45.5 % (ref 39.0–52.0)
Hemoglobin: 15.3 g/dL (ref 13.0–17.0)
Immature Granulocytes: 1 %
Lymphocytes Relative: 15 %
Lymphs Abs: 1.6 10*3/uL (ref 0.7–4.0)
MCH: 31.2 pg (ref 26.0–34.0)
MCHC: 33.6 g/dL (ref 30.0–36.0)
MCV: 92.9 fL (ref 80.0–100.0)
Monocytes Absolute: 0.6 10*3/uL (ref 0.1–1.0)
Monocytes Relative: 6 %
Neutro Abs: 8.1 10*3/uL — ABNORMAL HIGH (ref 1.7–7.7)
Neutrophils Relative %: 77 %
Platelets: 255 10*3/uL (ref 150–400)
RBC: 4.9 MIL/uL (ref 4.22–5.81)
RDW: 12.6 % (ref 11.5–15.5)
WBC: 10.5 10*3/uL (ref 4.0–10.5)
nRBC: 0 % (ref 0.0–0.2)

## 2021-12-21 MED ORDER — PREDNISONE 1 MG PO TABS: 1.0000 mg | ORAL_TABLET | ORAL | 0 refills | Status: DC

## 2021-12-22 ENCOUNTER — Other Ambulatory Visit: Payer: Self-pay

## 2021-12-23 ENCOUNTER — Other Ambulatory Visit: Payer: Self-pay

## 2021-12-23 ENCOUNTER — Encounter: Payer: Self-pay | Admitting: Oncology

## 2021-12-23 MED ORDER — OMEPRAZOLE 20 MG PO CPDR: 20.0000 mg | DELAYED_RELEASE_CAPSULE | Freq: Every day | ORAL | 0 refills | Status: DC

## 2021-12-23 NOTE — Progress Notes (Signed)
Hematology/Oncology Progress note [phone] [phone]      Patient Care Team: Matthew Orleans, MD as PCP - General (Internal Medicine) Woodard, Matthew Char, MD as Consulting Physician (Dermatology) Matthew Server, MD as Consulting Physician (Oncology)  REFERRING PROVIDER: Danae Orleans, MD  CHIEF COMPLAINTS/REASON FOR VISIT:  Follow-up of kidney cancer  HISTORY OF PRESENTING ILLNESS:   Matthew Woodard is a  56 y.o.  male with PMH listed below was seen in consultation at the request of  Matthew Orleans, MD  for evaluation of kidney cancer Patient's cancer history dated back to February 2020 when he developed gross hematuria with small clots and right-sided flank pain.  Patient had a CT urogram done which showed right 4 cm central enhancing renal mass with invasion into the collecting system.  X-ray of chest was performed to see complete staging which showed no concerning findings. 06/28/2018 patient underwent a cystoscopy, bladder biopsy and a right retrograde pyelogram with intraoperative interpretation, right diagnostic ureteroscopy, right renal pelvis biopsy, right ureteral stent placement.  biopsy showed atypical cell clusters, with extensive crush artifact, nondiagnostic.   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  Surgical margins negative for tumor.pT3a,Nx Patient has been on surveillance after surgery. 10/28/2018 CT abdomen pelvis with contrast showed minimal fluid and or postoperative changes within the right renal fossa.  No evidence of metastatic disease or recurrence.  Patient has left kidney lesion previously characterized as nonenhancing cyst by multi phasic contrast-enhanced CT. Patient reports an episode of gross hematuria in early December 2020, patient is due for 63-month surveillance CT scan. 04/30/2019 CT abdomen pelvis with contrast showed high attenuation mass at the right uretero vesicle junction, with extension into the bladder.  Bilateral pulmonary nodules, most indicated for metastatic disease.   Patient underwent cystoscopy and transurethral resection of irregular bladder tumor 3 cm.  Mass appeared to emanate from the right ureteral orifice.  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 Patient was referred to me for further work-up and management.  #Staging chest CT with contrast showed numerous bilateral pulmonary nodules.  Compatible with metastatic disease. Bone scan showed no osseous metastatic disease.   #History of tobacco abuse, former smoker.  Quit in 2015.  He denies any hemoptysis, chest pain, shortness of breath today. Patient reports that hematuria has completely resolved.  Denies any pain today. He is accompanied by his wife.  #History of cutaneous psoriasis, mostly on his right hand, currently on weekly methotrexate 12.5 mg with good symptom control. #NGS: [facility] PD L1 TPS 1%  # patient was referred to ENT for evaluation of headache and sinus pressure.  He had a ENT evaluation and feels that patient does not have sinusitis.  Matthew Woodard recommended MRI brain to rule out PRES syndrome.  Axitinib has been held MRI brain was done and was negative.   #09/2019 off axitinib and Keytruda since the beginning of May due to transaminitis. Patient was seen and evaluated by gastroenterology Dr. AVicente Woodard Work-up was negative. Ultrasound liver vascular Doppler is negative. Recommend observation. #11/24/2019, resumed on Keytruda every 3 weeks #01/05/2020, axitinib was resumed 3 mg #01/20/2020, axitinib was discontinued due to recurrence of transaminitis  Matthew Flockwas temporarily held. #03/16/2020, resumed on Keytruda every 3 weeks. 09/29/2020 CT scan showed no evidence of local recurrence or new/progressive disease  in the chest abdomen or  pelvis.  Tiny nonspecific lung nodules are unchanged.  No new lung nodules. May 2022, left knee replacement 01/14/2021 CT chest abdomen pelvis showed stable exam, no new or progressive metastatic disease within chest abdomen pelvis. Stable lung nodules. Aortic atherosclerosis.  Sept 2022 right knee replacement 04/15/2021, CT chest abdomen pelvis with contrast showed stable examination.  No evidence of recurrent or metastatic carcinoma within the chest abdomen pelvis.  08/02/2021, CT chest abdomen pelvis without contrast, stable examination without evidence of recurrence or metastatic disease.  Mild asymmetric diffuse esophageal wall thickening, similar to prior examination.  Likely chronic esophagitis.  Aortic atherosclerosis.  10/21/2021, colonoscopy showed 3 mm polyp which was resected and retrieved.  Negative for dysplasia/malignancy.  Random biopsy showed lymphocytic colitis. EGD showed long segment Barrett's esophagus.  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  Negative for dysplasia and malignancy.  Small bowel cold biopsy showed reactive duodenitis.  Negative for CMV.  Negative for dysplasia or malignancy.   INTERVAL HISTORY Matthew Woodard is a 56 y.o. male who has above history reviewed by me today presents for follow up visit for management of metastatic RCC. Diarrhea has resolved, patient has been on tapering course of steroids.  Today he has no new complaints.  Review of Systems  Constitutional:  Negative for appetite change, chills, fatigue, fever and unexpected weight change.  HENT:   Negative for hearing loss and voice change.   Eyes:  Negative for eye problems and icterus.  Respiratory:  Negative for chest tightness, cough and shortness of breath.   Cardiovascular:  Negative for chest pain and leg swelling.  Gastrointestinal:  Negative for abdominal distention, abdominal pain, blood in stool, diarrhea and nausea.   Endocrine: Negative for hot flashes.  Genitourinary:  Negative for difficulty urinating, dysuria and frequency.   Musculoskeletal:  Positive for arthralgias.  Skin:  Negative for itching and rash.  Neurological:  Negative for extremity weakness, headaches, light-headedness and numbness.  Hematological:  Negative for adenopathy. Does not bruise/bleed easily.  Psychiatric/Behavioral:  Negative for confusion.     MEDICAL HISTORY:  Past Medical History:  Diagnosis Date   Arthritis    hands, left knee   Cancer ([place])    Diabetes mellitus without complication ([place])    Eczema 01/08/2017   GERD (gastroesophageal reflux disease)    History of kidney cancer    Hyperlipidemia LDL goal <100 11/02/2014   Hypertension    Kidney stone    YEAR H/O HEMATURIA   Psoriasis    Wears dentures    full upper, partial lower.  Has, does not wear    SURGICAL HISTORY: Past Surgical History:  Procedure Laterality Date   COLONOSCOPY N/A 10/21/2021   Procedure: COLONOSCOPY;  Surgeon: [name], MD;  Location: [facility];  Service: Endoscopy;  Laterality: N/A;   COLONOSCOPY WITH PROPOFOL N/A 02/23/2017   Procedure: COLONOSCOPY WITH PROPOFOL;  Surgeon: [name], MD;  Location: [facility];  Service: Endoscopy;  Laterality: N/A;  Diabetic - oral meds   CYST EXCISION  Aug. 2016   on back   CYSTOSCOPY W/ RETROGRADES Right 06/28/2018   Procedure: CYSTOSCOPY WITH RETROGRADE PYELOGRAM;  Surgeon: Billey Co, MD;  Location: [facility];  Service: Urology;  Laterality: Right;   CYSTOSCOPY W/ URETERAL STENT REMOVAL Right 07/22/2018   Procedure: CYSTOSCOPY WITH STENT REMOVAL;  Surgeon: Billey Co, MD;  Location: [facility];  Service: Urology;  Laterality: Right;   CYSTOSCOPY WITH BIOPSY Right 06/28/2018  Procedure: CYSTOSCOPY WITH RENAL BIOPSY;  Surgeon: Billey Co, MD;  Location: [facility];  Service: Urology;  Laterality: Right;   CYSTOSCOPY WITH URETEROSCOPY AND STENT PLACEMENT Right  06/28/2018   Procedure: CYSTOSCOPY WITH URETEROSCOPY AND STENT PLACEMENT;  Surgeon: Billey Co, MD;  Location: [facility];  Service: Urology;  Laterality: Right;   ESOPHAGOGASTRODUODENOSCOPY N/A 10/21/2021   Procedure: ESOPHAGOGASTRODUODENOSCOPY (EGD);  Surgeon: [name], MD;  Location: [place];  Service: Endoscopy;  Laterality: N/A;   FULGURATION OF BLADDER TUMOR N/A 06/28/2018   Procedure: CYSTOSCOPY BLADDER BIOPSY, FULGERATION OF BLADDER;  Surgeon: Billey Co, MD;  Location: [facility];  Service: Urology;  Laterality: N/A;   KNEE SURGERY Left    LAPAROSCOPIC NEPHRECTOMY, HAND ASSISTED Right 07/22/2018   Procedure: HAND ASSISTED LAPAROSCOPIC NEPHRECTOMY;  Surgeon: Billey Co, MD;  Location: [facility];  Service: Urology;  Laterality: Right;   NASAL SINUS SURGERY  03/15   POLYPECTOMY N/A 02/23/2017   Procedure: POLYPECTOMY;  Surgeon: [name], MD;  Location: [place];  Service: Endoscopy;  Laterality: N/A;   POLYPECTOMY  10/21/2021   Procedure: POLYPECTOMY;  Surgeon: [name], MD;  Location: [place] [facility];  Service: Endoscopy;;   tooth abstraction     TRANSURETHRAL RESECTION OF BLADDER TUMOR N/A 05/12/2019   Procedure: TRANSURETHRAL RESECTION OF BLADDER TUMOR (TURBT);  Surgeon: Billey Co, MD;  Location: [facility];  Service: Urology;  Laterality: N/A;   VARICOCELE EXCISION     XI ROBOTIC ASSISTED VENTRAL HERNIA N/A 11/18/2020   Procedure: XI ROBOTIC ASSISTED VENTRAL HERNIA, incisional;  Surgeon: Jules Husbands, MD;  Location: [facility];  Service: General;  Laterality: N/A;    SOCIAL HISTORY: Social History   Socioeconomic History   Marital status: Married    Spouse name: Alma Friendly   Number of children: Not on file   Years of education: Not on file   Highest education level: Not on file  Occupational History   Not on file  Tobacco Use   Smoking status: Former    Types: Cigarettes    Quit date: 06/15/2013    Years since quitting: 8.5    Smokeless tobacco: Never  Vaping Use   Vaping Use: Never used  Substance and Sexual Activity   Alcohol use: No    Alcohol/week: 0.0 standard drinks of alcohol   Drug use: No   Sexual activity: Yes  Other Topics Concern   Not on file  Social History Narrative   Lives with wife, sister in law   Social Determinants of Health   Financial Resource Strain: Not on file  Food Insecurity: Not on file  Transportation Needs: Not on file  Physical Activity: Not on file  Stress: Not on file  Social Connections: Not on file  Intimate Partner Violence: Not on file    FAMILY HISTORY: Family History  Problem Relation Age of Onset   Heart disease Father    Hypertension Father    COPD Father    Colon cancer Maternal Grandmother    Heart attack Paternal Grandfather     ALLERGIES:  is allergic to axitinib, propofol, and glucotrol [glipizide].  MEDICATIONS:  Current Outpatient Medications  Medication Sig Dispense Refill   ALPRAZolam (XANAX) 0.5 MG tablet Take 1 tablet (0.5 mg total) by mouth 2 (two) times daily as needed. 60 tablet 0   aspirin 81 MG EC tablet Take 81 mg by mouth daily with supper.     clobetasol ointment (TEMOVATE) 7.37 % Apply 1 application  topically 2 (two) times daily as needed for rash.     Glucose Blood (ACCU-CHEK AVIVA PLUS VI) by In Vitro route.     JARDIANCE 10 MG TABS tablet TAKE 1 TABLET BY MOUTH DAILY 30 tablet 2   lisinopril (PRINIVIL,ZESTRIL) 10 MG tablet TAKE 1 TABLET(10 MG) BY MOUTH DAILY 90 tablet 1   metFORMIN (GLUCOPHAGE-XR) 500 MG 24 hr tablet Take 3 pills by mouth daily for 4-5 days, then increase to 4 pills daily (if tolerated) (Patient taking differently: Take 1,000 mg by mouth daily with supper.) 360 tablet 0   Multiple Vitamin (MULTIVITAMIN) tablet Take 1 tablet by mouth daily with supper.     OZEMPIC, 0.25 OR 0.5 MG/DOSE, 2 MG/1.5ML SOPN Inject 1 mg into the skin every Sunday.     rosuvastatin (CRESTOR) 20 MG tablet Take 20 mg by mouth daily with  supper.     budesonide (ENTOCORT EC) 3 MG 24 hr capsule Take 3 capsules (9 mg total) by mouth daily. (Patient not taking: Reported on 12/21/2021) 90 capsule 1   cyclobenzaprine (FLEXERIL) 10 MG tablet Take 10 mg by mouth 3 (three) times daily as needed for muscle spasms. (Patient not taking: Reported on 12/21/2021)     diphenoxylate-atropine (LOMOTIL) 2.5-0.025 MG tablet Take 1 tablet by mouth 4 (four) times daily as needed for diarrhea or loose stools. (Patient not taking: Reported on 11/23/2021) 60 tablet 1   omeprazole (PRILOSEC) 20 MG capsule Take 1 capsule (20 mg total) by mouth daily. 30 capsule 0   predniSONE (DELTASONE) 1 MG tablet Take 1 tablet (1 mg total) by mouth See admin instructions. Take '2mg'$  daily for 1 week, then take '1mg'$  daily for 1 weeks, take '1mg'$  every other day for 1 week then stop. 120 tablet 0   No current facility-administered medications for this visit.     PHYSICAL EXAMINATION: ECOG PERFORMANCE STATUS: 1 - Symptomatic but completely ambulatory Vitals:   12/21/21 1442  BP: 127/76  Pulse: (!) 101  Resp: 18  Temp: (!) 96.7 F (35.9 C)   Filed Weights   12/21/21 1442  Weight: 209 lb 6.4 oz (95 kg)    Physical Exam Constitutional:      General: He is not in acute distress. HENT:     Head: Normocephalic and atraumatic.  Eyes:     General: No scleral icterus.    Pupils: Pupils are equal, round, and reactive to light.  Cardiovascular:     Rate and Rhythm: Normal rate and regular rhythm.     Heart sounds: Normal heart sounds.  Pulmonary:     Effort: Pulmonary effort is normal. No respiratory distress.     Breath sounds: No wheezing.  Abdominal:     General: Bowel sounds are normal. There is no distension.     Palpations: Abdomen is soft. There is no mass.     Tenderness: There is no abdominal tenderness.  Musculoskeletal:        General: No deformity. Normal range of motion.     Cervical back: Normal range of motion and neck supple.  Skin:    General: Skin  is warm and dry.     Findings: No erythema or rash.  Neurological:     Mental Status: He is alert and oriented to person, place, and time. Mental status is at baseline.     Cranial Nerves: No cranial nerve deficit.     Coordination: Coordination normal.  Psychiatric:        Mood and Affect: Mood  normal.     LABORATORY DATA:  I have reviewed the data as listed    Latest Ref Rng & Units 12/21/2021    2:23 PM 11/23/2021    1:04 PM 10/06/2021    2:07 PM  CBC  WBC 4.0 - 10.5 Matthew/uL 10.5  9.9  9.2   Hemoglobin 13.0 - 17.0 g/dL 15.3  14.5  14.1   Hematocrit 39.0 - 52.0 % 45.5  43.8  41.7   Platelets 150 - 400 Matthew/uL 255  238  237       Latest Ref Rng & Units 12/21/2021    2:23 PM 11/23/2021    1:04 PM 10/06/2021    2:07 PM  CMP  Glucose 70 - 99 mg/dL 130  262  265   BUN 6 - 20 mg/dL '30  27  21   '$ Creatinine 0.61 - 1.24 mg/dL 1.37  1.34  1.16   Sodium 135 - 145 mmol/L 137  136  134   Potassium 3.5 - 5.1 mmol/L 4.6  4.7  4.8   Chloride 98 - 111 mmol/L 104  101  101   CO2 22 - 32 mmol/L '25  24  25   '$ Calcium 8.9 - 10.3 mg/dL 10.1  9.4  9.0   Total Protein 6.5 - 8.1 g/dL 7.3  7.3  6.8   Total Bilirubin 0.3 - 1.2 mg/dL 0.5  1.0  0.7   Alkaline Phos 38 - 126 U/L 63  66  55   AST 15 - 41 U/L '26  26  26   '$ ALT 0 - 44 U/L '21  24  25     '$ RADIOGRAPHIC STUDIES: I have personally reviewed the radiological images as listed and agreed with the findings in the report.  CT CHEST ABDOMEN PELVIS WO CONTRAST  Result Date: 11/16/2021 CLINICAL DATA:  History of metastatic renal cell carcinoma undergoing immunotherapy, follow-up/restaging. * Tracking Code: BO * EXAM: CT CHEST, ABDOMEN AND PELVIS WITHOUT CONTRAST TECHNIQUE: Multidetector CT imaging of the chest, abdomen and pelvis was performed following the standard protocol without IV contrast. RADIATION DOSE REDUCTION: This exam was performed according to the departmental dose-optimization program which includes automated exposure control, adjustment of the mA  and/or kV according to patient size and/or use of iterative reconstruction technique. COMPARISON:  Multiple priors including most recent CT Woodard 21, 2023 FINDINGS: CT CHEST FINDINGS Cardiovascular: Aortic and branch vessel atherosclerosis without thoracic aortic aneurysm. Coronary artery calcifications. Normal size heart. No significant pericardial effusion/thickening. Mediastinum/Nodes: No discrete thyroid nodule. No pathologically enlarged mediastinal, hilar or axillary lymph nodes, noting limited sensitivity for the detection of hilar adenopathy on this noncontrast study. Retained versus refluxed contrast in the esophagus with similar mild symmetric distal esophageal wall thickening. Lungs/Pleura: Tiny 4 mm or smaller bilateral pulmonary nodules are unchanged over multiple prior examinations for instance measuring 3 mm in the right lung apex on image 30/3. No new suspicious pulmonary nodules or masses. Scarring versus atelectasis in the right middle lobe. No pleural effusion. No pneumothorax. Musculoskeletal: No aggressive lytic or blastic lesion of bone. Multilevel degenerative changes spine. CT ABDOMEN PELVIS FINDINGS Hepatobiliary: No suspicious hepatic lesion on this noncontrast examination. Gallbladder is decompressed. No biliary ductal dilation. Pancreas: No pancreatic ductal dilation or evidence of acute inflammation. Spleen: No splenomegaly or focal splenic lesion. Adrenals/Urinary Tract: Bilateral adrenal glands are within normal limits. Right kidney is surgically absent without new soft tissue nodularity in the nephrectomy bed. No left-sided hydronephrosis. Small hyper and hypodense left renal lesions  are incompletely evaluated on this noncontrast examination but appear similar over multiple prior examinations dating back to June 21, 2018, most consistent with benign Bosniak classification 1 and 2 renal cysts, which in the absence of clinically indicated signs/symptoms require no independent imaging  follow-up. Urinary bladder is unremarkable for degree of distension. Stomach/Bowel: Radiopaque enteric contrast material traverses distal loops of small bowel. Stomach is unremarkable for degree of distension. No pathologic dilation of small or large bowel. The appendix and terminal ileum appear normal. Sigmoid colonic diverticulosis without findings of acute diverticulitis. No evidence of acute bowel inflammation. Vascular/Lymphatic: Aortic and branch vessel atherosclerosis without abdominal aortic aneurysm. No pathologically enlarged abdominal or pelvic lymph nodes. Reproductive: Dystrophic prostatic calcifications. Trace bilateral hydroceles. Other: No significant abdominopelvic free fluid. Musculoskeletal: No aggressive lytic or blastic lesion of bone. Multilevel degenerative changes spine. IMPRESSION: 1. Stable examination without evidence of local recurrence or active metastatic disease in the chest, abdomen or pelvis. 2. Reflux/retained contrast in the esophagus with similar mild symmetric esophageal wall thickening suggestive of esophagitis. 3.  Aortic Atherosclerosis (ICD10-I70.0). Electronically Signed   By: Dahlia Bailiff M.D.   On: 11/16/2021 09:58       ASSESSMENT & PLAN:  1. Clear cell carcinoma of right kidney ([place])   2. Chemotherapy induced diarrhea   3. Esophagitis   4. 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 -NED Hold immunotherapy due to diarrhea. 11/16/2021, CT chest abdomen pelvis without contrast showed stable examination without evidence of local recurrence or active malignant disease in the chest abdomen pelvis.  Reflux retained contrast in the esophagus suggestive of esophagitis.  Aortic atherosclerosis Plan to repeat CT scan in October 2023.   #Barrett's esophagitis, continue Protonix  #Immunotherapy induced diarrhea, negative C. difficile, negative GI panel.  + Lymphocytic colitis Symptoms are improving.  Diarrhea has  resolved. Slow taper prednisone.   Recommend patient to take prednisone 2 mg daily for 1 week followed by 1 mg daily for 1 week followed by 1 mg every other day for 1 week then stop.  Patient feels that he has enough supply at home.  If needed he will call us for refills.  #Anxiety/insomnia, improved.  Continue Xanax 0.5 mg twice daily as needed for anxiety.   Follow-up after CT scan. All questions were answered. The patient knows to call the clinic with any problems questions or concerns.  Matthew Server, MD, PhD 12/23/2021

## 2022-01-07 ENCOUNTER — Other Ambulatory Visit: Payer: Self-pay

## 2022-01-22 ENCOUNTER — Other Ambulatory Visit: Payer: Self-pay | Admitting: Oncology

## 2022-01-22 NOTE — Progress Notes (Signed)
ON PATHWAY REGIMEN - Renal Cell  No Change  Continue With Treatment as Ordered.  Original Decision Date/Time: 05/19/2019 23:01     A cycle is every 21 days:     Axitinib      Pembrolizumab   **Always confirm dose/schedule in your pharmacy ordering system**  Patient Characteristics: Stage IV/Metastatic Disease, Clear Cell, First Line, Intermediate or Poor Risk Therapeutic Status: Stage IV/Metastatic Disease Histology: Clear Cell Line of Therapy: First Line Risk Status: Intermediate Risk Intent of Therapy: Non-Curative / Palliative Intent, Discussed with Patient

## 2022-02-07 ENCOUNTER — Other Ambulatory Visit: Payer: Self-pay | Admitting: Oncology

## 2022-02-08 ENCOUNTER — Encounter: Payer: Self-pay | Admitting: Oncology

## 2022-02-08 MED ORDER — ALPRAZOLAM 0.5 MG PO TABS: 0.5000 mg | ORAL_TABLET | Freq: Two times a day (BID) | ORAL | 0 refills | Status: DC | PRN

## 2022-02-27 ENCOUNTER — Ambulatory Visit
Admission: RE | Admit: 2022-02-27 | Discharge: 2022-02-27 | Disposition: A | Discharge status: Home / Self Care | Payer: 59 | Source: Ambulatory Visit | Attending: Oncology | Admitting: Oncology

## 2022-02-27 DIAGNOSIS — C641 Malignant neoplasm of right kidney, except renal pelvis: Secondary | ICD-10-CM | POA: Diagnosis present

## 2022-02-28 ENCOUNTER — Other Ambulatory Visit: Payer: Self-pay

## 2022-03-01 ENCOUNTER — Inpatient Hospital Stay: Payer: 59 | Attending: Oncology

## 2022-03-01 ENCOUNTER — Inpatient Hospital Stay (HOSPITAL_BASED_OUTPATIENT_CLINIC_OR_DEPARTMENT_OTHER): Payer: 59 | Admitting: Oncology

## 2022-03-01 ENCOUNTER — Encounter: Payer: Self-pay | Admitting: Oncology

## 2022-03-01 VITALS — BP 133/84 | HR 87 | Temp 96.5°F | Resp 16 | Wt 216.0 lb

## 2022-03-01 DIAGNOSIS — Z905 Acquired absence of kidney: Secondary | ICD-10-CM | POA: Diagnosis not present

## 2022-03-01 DIAGNOSIS — C7911 Secondary malignant neoplasm of bladder: Secondary | ICD-10-CM | POA: Diagnosis not present

## 2022-03-01 DIAGNOSIS — C641 Malignant neoplasm of right kidney, except renal pelvis: Secondary | ICD-10-CM

## 2022-03-01 DIAGNOSIS — K219 Gastro-esophageal reflux disease without esophagitis: Secondary | ICD-10-CM | POA: Diagnosis not present

## 2022-03-01 DIAGNOSIS — Z9221 Personal history of antineoplastic chemotherapy: Secondary | ICD-10-CM | POA: Diagnosis not present

## 2022-03-01 DIAGNOSIS — L409 Psoriasis, unspecified: Secondary | ICD-10-CM

## 2022-03-01 DIAGNOSIS — C7800 Secondary malignant neoplasm of unspecified lung: Secondary | ICD-10-CM | POA: Insufficient documentation

## 2022-03-01 LAB — CBC WITH DIFFERENTIAL/PLATELET
Abs Immature Granulocytes: 0.04 10*3/uL (ref 0.00–0.07)
Basophils Absolute: 0 10*3/uL (ref 0.0–0.1)
Basophils Relative: 0 %
Eosinophils Absolute: 0.2 10*3/uL (ref 0.0–0.5)
Eosinophils Relative: 2 %
HCT: 44.6 % (ref 39.0–52.0)
Hemoglobin: 14.8 g/dL (ref 13.0–17.0)
Immature Granulocytes: 1 %
Lymphocytes Relative: 31 %
Lymphs Abs: 2.4 10*3/uL (ref 0.7–4.0)
MCH: 30 pg (ref 26.0–34.0)
MCHC: 33.2 g/dL (ref 30.0–36.0)
MCV: 90.5 fL (ref 80.0–100.0)
Monocytes Absolute: 0.7 10*3/uL (ref 0.1–1.0)
Monocytes Relative: 9 %
Neutro Abs: 4.5 10*3/uL (ref 1.7–7.7)
Neutrophils Relative %: 57 %
Platelets: 230 10*3/uL (ref 150–400)
RBC: 4.93 MIL/uL (ref 4.22–5.81)
RDW: 12 % (ref 11.5–15.5)
WBC: 7.9 10*3/uL (ref 4.0–10.5)
nRBC: 0 % (ref 0.0–0.2)

## 2022-03-01 LAB — COMPREHENSIVE METABOLIC PANEL
ALT: 16 U/L (ref 0–44)
AST: 20 U/L (ref 15–41)
Albumin: 4.2 g/dL (ref 3.5–5.0)
Alkaline Phosphatase: 69 U/L (ref 38–126)
Anion gap: 6 (ref 5–15)
BUN: 22 mg/dL — ABNORMAL HIGH (ref 6–20)
CO2: 26 mmol/L (ref 22–32)
Calcium: 9.6 mg/dL (ref 8.9–10.3)
Chloride: 103 mmol/L (ref 98–111)
Creatinine, Ser: 1.1 mg/dL (ref 0.61–1.24)
GFR, Estimated: >60 mL/min (ref >60)
Glucose, Bld: 101 mg/dL — ABNORMAL HIGH (ref 70–99)
Potassium: 4.6 mmol/L (ref 3.5–5.1)
Sodium: 135 mmol/L (ref 135–145)
Total Bilirubin: 0.6 mg/dL (ref 0.3–1.2)
Total Protein: 7.3 g/dL (ref 6.5–8.1)

## 2022-03-01 LAB — LACTATE DEHYDROGENASE: LDH: 109 U/L (ref 98–192)

## 2022-03-01 MED ORDER — OMEPRAZOLE 20 MG PO CPDR: 20.0000 mg | DELAYED_RELEASE_CAPSULE | Freq: Every day | ORAL | 1 refills | Status: DC | PRN

## 2022-03-01 NOTE — Assessment & Plan Note (Addendum)
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 -NED CT scan was reviewed with patient and wife. -NED.  Recommend continue observation. Repeat CT in 4 months.

## 2022-03-01 NOTE — Assessment & Plan Note (Signed)
Follow up with dermatology.  On topical steroid cream PRN 

## 2022-03-01 NOTE — Assessment & Plan Note (Signed)
Continue omeprazole rx sent

## 2022-03-01 NOTE — Progress Notes (Signed)
Pt and spouse in for follow up and CT results.

## 2022-03-01 NOTE — Progress Notes (Signed)
Hematology/Oncology Progress note [phone] [phone]      Patient Care Team: Danae Orleans, MD as PCP - General (Internal Medicine) [name], [name], MD as Consulting Physician (Dermatology) Earlie Server, MD as Consulting Physician (Oncology)  ASSESSMENT & PLAN:   Metastatic renal cell carcinoma 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 -NED CT scan was reviewed with patient and wife. -NED.  Recommend continue observation. Repeat CT in 4 months.   GERD (gastroesophageal reflux disease) Continue omeprazole rx sent  Psoriasis Follow up with dermatology.  On topical steroid cream PRN   Orders Placed This Encounter  Procedures   CT CHEST ABDOMEN PELVIS W CONTRAST    Standing Status:   Future    Standing Expiration Date:   03/02/2023    Order Specific Question:   Preferred imaging location?    Answer:   [facility]    Order Specific Question:   Is Oral Contrast requested for this exam?    Answer:   Yes, Per Radiology protocol   CBC with Differential/Platelet    Standing Status:   Future    Standing Expiration Date:   03/02/2023   Comprehensive metabolic panel    Standing Status:   Future    Standing Expiration Date:   03/01/2023   Lactate dehydrogenase    Standing Status:   Future    Standing Expiration Date:   03/02/2023   Follow up in 4 month All questions were answered. The patient knows to call the clinic with any problems, questions or concerns.  Earlie Server, MD, PhD [name] 03/01/2022   CHIEF COMPLAINTS/REASON FOR VISIT:  Follow-up of kidney cancer  HISTORY OF PRESENTING ILLNESS:   Matthew Woodard is a  56 y.o.  male presents for follow up for metastatic kidney cancer Oncology History  Metastatic renal cell carcinoma ([place])  06/2018 Imaging   CT urogram done which showed right 4 cm central enhancing renal mass with invasion into the collecting system. X-ray of  chest was performed to see complete staging which showed no concerning findings   06/28/2018 Cancer Diagnosis      06/28/2018 Procedure    patient underwent a cystoscopy, bladder biopsy and a right retrograde pyelogram with intraoperative interpretation, right diagnostic ureteroscopy, right renal pelvis biopsy, right ureteral stent placement.  biopsy showed atypical cell clusters, with extensive crush artifact, nondiagnostic.     07/22/2018 Initial Diagnosis   Patient underwent right radical laparoscopic nephrectomy with biopsy showing 5.3 cm RCC, clear cell type, grade 3, tumor invades renal vein and segmental branches, pelvic calyceal system and perirenal sinus/fat.  Surgical margins negative for tumor.pT3a,Nx Post-operation, patient was on surveillance after surgery.   10/28/2018 Imaging   CT abdomen pelvis with contrast showed minimal fluid and or postoperative changes within the right renal fossa.  No evidence of metastatic disease or recurrence.  Patient has left kidney lesion previously characterized as nonenhancing cyst by multi phasic contrast-enhanced CT.   04/30/2019 Progression   Patient reports an episode of gross hematuria in early December 2020, patient is due for 21-monthsurveillance CT scan. 04/30/2019 CT abdomen pelvis with contrast showed high attenuation mass at the right uretero vesicle junction, with extension into the bladder.  Bilateral pulmonary nodules, most indicated for metastatic disease.  Patient underwent cystoscopy and transurethral resection of irregular bladder tumor 3 cm.  Mass appeared to emanate from the right ureteral orifice.  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  #  NGS: [facility] PD L1 TPS 1%   05/28/2019 Imaging   CT chest w contrast 1. Numerous bilateral pulmonary nodules measuring up to 10 mm diameter. Imaging features compatible with metastatic disease. 2. Left hilar lymphadenopathy, also highly  suspicious for metastatic involvement.    05/28/2019 Imaging   Bone scan showed  No scintigraphic evidence of osseous metastatic disease.    06/05/2019 - 06/21/2021 Chemotherapy   06/05/19 Axitinib '5mg'$  BID+ Keytruda  09/2019 off axitinib and Keytruda since the beginning of May due to transaminitis. GI work up negative.  Ultrasound liver vascular Doppler is negative #11/24/2019, resumed on Keytruda every 3 weeks #01/05/2020, axitinib was resumed 3 mg #01/20/2020, axitinib was discontinued due to recurrence of transaminitis.  Beryle Flock was temporarily held. #03/16/2020-06/21/2021 resumed on Keytruda every 3 weeks Beryle Flock was discontinued due to recurrent immunotherapy induced diarrhea    07/25/2019 Imaging   Brain w wo contrast No mass, hemorrhage, or abnormal enhancement.   09/29/2020 Imaging   CT scan showed no evidence of local recurrence or new/progressive disease in the chest abdomen or pelvis.  Tiny nonspecific lung nodules are unchanged.  No new lung nodules   01/14/2021 Imaging   CT chest abdomen pelvis showed stable exam, no new or progressive metastatic disease within chest abdomen pelvis. Stable lung nodules. Aortic atherosclerosis.    04/15/2021 Imaging   CT chest abdomen pelvis with contrast showed stable examination.  No evidence of recurrent or metastatic carcinoma within the chest abdomen pelvis.     08/02/2021 Imaging   CT chest abdomen pelvis without contrast, stable examination without evidence of recurrence or metastatic disease.  Mild asymmetric diffuse esophageal wall thickening, similar to prior examination.  Likely chronic esophagitis.  Aortic atherosclerosis   10/21/2021 Procedure   colonoscopy showed 3 mm polyp which was resected and retrieved.  Negative for dysplasia/malignancy.  Random biopsy showed lymphocytic colitis. EGD showed long segment Barrett's esophagus.  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  Negative for dysplasia and  malignancy.  Small bowel cold biopsy showed reactive duodenitis.  Negative for CMV.  Negative for dysplasia or malignancy.   11/16/2021 Imaging   CT chest abdomen pelvis without contrast showed stable examination without evidence of local recurrence or active malignant disease in the chest abdomen pelvis.  Reflux retained contrast in the esophagus suggestive of esophagitis.  Aortic atherosclerosis   Clear cell carcinoma of right kidney ([place])  05/14/2019 Initial Diagnosis   Clear cell carcinoma of right kidney ([place])   06/05/2019 - 06/21/2021 Chemotherapy   Patient is on Treatment Plan : Pembrolizumab Q21D     06/05/2019 -  Chemotherapy   Patient is on Treatment Plan : RENAL CELL Pembrolizumab (200) + Axitinib q21d        #History of cutaneous psoriasis, mostly on his right hand,    INTERVAL HISTORY LEXTON HIDALGO Woodard is a 56 y.o. male who has above history reviewed by me today presents for follow up visit for management of metastatic RCC. Patient reports doing well. No diarrhea. No new complaints. Accompanied by his wife.  Insomnia symptom has improved.  Intermittent acid reflex, he takes PPI.   Review of Systems  Constitutional:  Negative for appetite change, chills, fatigue, fever and unexpected weight change.  HENT:   Negative for hearing loss and voice change.   Eyes:  Negative for eye problems and icterus.  Respiratory:  Negative for chest tightness, cough and shortness of breath.   Cardiovascular:  Negative for chest pain and  leg swelling.  Gastrointestinal:  Negative for abdominal distention, abdominal pain, blood in stool, diarrhea and nausea.  Endocrine: Negative for hot flashes.  Genitourinary:  Negative for difficulty urinating, dysuria and frequency.   Musculoskeletal:        Bilateral knee replacement.   Skin:  Negative for itching and rash.  Neurological:  Negative for extremity weakness, headaches, light-headedness and numbness.  Hematological:  Negative for adenopathy.  Does not bruise/bleed easily.  Psychiatric/Behavioral:  Negative for confusion.     MEDICAL HISTORY:  Past Medical History:  Diagnosis Date   Arthritis    hands, left knee   Cancer ([place])    Diabetes mellitus without complication ([place])    Eczema 01/08/2017   GERD (gastroesophageal reflux disease)    History of kidney cancer    Hyperlipidemia LDL goal <100 11/02/2014   Hypertension    Kidney stone    YEAR H/O HEMATURIA   Psoriasis    Wears dentures    full upper, partial lower.  Has, does not wear    SURGICAL HISTORY: Past Surgical History:  Procedure Laterality Date   COLONOSCOPY N/A 10/21/2021   Procedure: COLONOSCOPY;  Surgeon: [name], MD;  Location: [place];  Service: Endoscopy;  Laterality: N/A;   COLONOSCOPY WITH PROPOFOL N/A 02/23/2017   Procedure: COLONOSCOPY WITH PROPOFOL;  Surgeon: [name], MD;  Location: [place];  Service: Endoscopy;  Laterality: N/A;  Diabetic - oral meds   CYST EXCISION  Aug. 2016   on back   CYSTOSCOPY W/ RETROGRADES Right 06/28/2018   Procedure: CYSTOSCOPY WITH RETROGRADE PYELOGRAM;  Surgeon: Billey Co, MD;  Location: [facility];  Service: Urology;  Laterality: Right;   CYSTOSCOPY W/ URETERAL STENT REMOVAL Right 07/22/2018   Procedure: CYSTOSCOPY WITH STENT REMOVAL;  Surgeon: Billey Co, MD;  Location: [facility];  Service: Urology;  Laterality: Right;   CYSTOSCOPY WITH BIOPSY Right 06/28/2018   Procedure: CYSTOSCOPY WITH RENAL BIOPSY;  Surgeon: Billey Co, MD;  Location: [facility];  Service: Urology;  Laterality: Right;   CYSTOSCOPY WITH URETEROSCOPY AND STENT PLACEMENT Right 06/28/2018   Procedure: CYSTOSCOPY WITH URETEROSCOPY AND STENT PLACEMENT;  Surgeon: Billey Co, MD;  Location: [facility];  Service: Urology;  Laterality: Right;   ESOPHAGOGASTRODUODENOSCOPY N/A 10/21/2021   Procedure: ESOPHAGOGASTRODUODENOSCOPY (EGD);  Surgeon: [name], MD;  Location: [place];  Service: Endoscopy;   Laterality: N/A;   FULGURATION OF BLADDER TUMOR N/A 06/28/2018   Procedure: CYSTOSCOPY BLADDER BIOPSY, FULGERATION OF BLADDER;  Surgeon: Billey Co, MD;  Location: [facility];  Service: Urology;  Laterality: N/A;   KNEE SURGERY Left    LAPAROSCOPIC NEPHRECTOMY, HAND ASSISTED Right 07/22/2018   Procedure: HAND ASSISTED LAPAROSCOPIC NEPHRECTOMY;  Surgeon: Billey Co, MD;  Location: [facility];  Service: Urology;  Laterality: Right;   NASAL SINUS SURGERY  03/15   POLYPECTOMY N/A 02/23/2017   Procedure: POLYPECTOMY;  Surgeon: [name], MD;  Location: [place];  Service: Endoscopy;  Laterality: N/A;   POLYPECTOMY  10/21/2021   Procedure: POLYPECTOMY;  Surgeon: [name], MD;  Location: [place];  Service: Endoscopy;;   tooth abstraction     TRANSURETHRAL RESECTION OF BLADDER TUMOR N/A 05/12/2019   Procedure: TRANSURETHRAL RESECTION OF BLADDER TUMOR (TURBT);  Surgeon: Billey Co, MD;  Location: [facility];  Service: Urology;  Laterality: N/A;   VARICOCELE EXCISION     XI ROBOTIC ASSISTED VENTRAL HERNIA N/A 11/18/2020   Procedure: XI ROBOTIC ASSISTED VENTRAL HERNIA,  incisional;  Surgeon: Jules Husbands, MD;  Location: [facility];  Service: General;  Laterality: N/A;    SOCIAL HISTORY: Social History   Socioeconomic History   Marital status: Married    Spouse name: Alma Friendly   Number of children: Not on file   Years of education: Not on file   Highest education level: Not on file  Occupational History   Not on file  Tobacco Use   Smoking status: Former    Types: Cigarettes    Quit date: 06/15/2013    Years since quitting: 8.7   Smokeless tobacco: Never  Vaping Use   Vaping Use: Never used  Substance and Sexual Activity   Alcohol use: No    Alcohol/week: 0.0 standard drinks of alcohol   Drug use: No   Sexual activity: Yes  Other Topics Concern   Not on file  Social History Narrative   Lives with wife, sister in law   Social Determinants of Health    Financial Resource Strain: Not on file  Food Insecurity: Not on file  Transportation Needs: Not on file  Physical Activity: Not on file  Stress: Not on file  Social Connections: Not on file  Intimate Partner Violence: Not on file    FAMILY HISTORY: Family History  Problem Relation Age of Onset   Heart disease Father    Hypertension Father    COPD Father    Colon cancer Maternal Grandmother    Heart attack Paternal Grandfather     ALLERGIES:  is allergic to axitinib, propofol, and glucotrol [glipizide].  MEDICATIONS:  Current Outpatient Medications  Medication Sig Dispense Refill   ALPRAZolam (XANAX) 0.5 MG tablet Take 1 tablet (0.5 mg total) by mouth 2 (two) times daily as needed. 60 tablet 0   aspirin 81 MG EC tablet Take 81 mg by mouth daily with supper.     clobetasol ointment (TEMOVATE) 7.09 % Apply 1 application topically 2 (two) times daily as needed for rash.     Glucose Blood (ACCU-CHEK AVIVA PLUS VI) by In Vitro route.     JARDIANCE 10 MG TABS tablet TAKE 1 TABLET BY MOUTH DAILY 30 tablet 2   lisinopril (PRINIVIL,ZESTRIL) 10 MG tablet TAKE 1 TABLET(10 MG) BY MOUTH DAILY 90 tablet 1   metFORMIN (GLUCOPHAGE-XR) 500 MG 24 hr tablet Take 3 pills by mouth daily for 4-5 days, then increase to 4 pills daily (if tolerated) (Patient taking differently: Take 1,000 mg by mouth daily with supper.) 360 tablet 0   Multiple Vitamin (MULTIVITAMIN) tablet Take 1 tablet by mouth daily with supper.     OZEMPIC, 0.25 OR 0.5 MG/DOSE, 2 MG/1.5ML SOPN Inject 1 mg into the skin every Sunday.     rosuvastatin (CRESTOR) 20 MG tablet Take 20 mg by mouth daily with supper.     budesonide (ENTOCORT EC) 3 MG 24 hr capsule Take 3 capsules (9 mg total) by mouth daily. (Patient not taking: Reported on 12/21/2021) 90 capsule 1   cyclobenzaprine (FLEXERIL) 10 MG tablet Take 10 mg by mouth 3 (three) times daily as needed for muscle spasms. (Patient not taking: Reported on 12/21/2021)      diphenoxylate-atropine (LOMOTIL) 2.5-0.025 MG tablet Take 1 tablet by mouth 4 (four) times daily as needed for diarrhea or loose stools. (Patient not taking: Reported on 11/23/2021) 60 tablet 1   omeprazole (PRILOSEC) 20 MG capsule Take 1 capsule (20 mg total) by mouth daily as needed (acid reflux). 30 capsule 1   No current facility-administered medications  for this visit.     PHYSICAL EXAMINATION: ECOG PERFORMANCE STATUS: 1 - Symptomatic but completely ambulatory Vitals:   03/01/22 1431  BP: 133/84  Pulse: 87  Resp: 16  Temp: (!) 96.5 F (35.8 C)  SpO2: 99%   Filed Weights   03/01/22 1431  Weight: 216 lb (98 kg)    Physical Exam Constitutional:      General: He is not in acute distress. HENT:     Head: Normocephalic and atraumatic.  Eyes:     General: No scleral icterus.    Pupils: Pupils are equal, round, and reactive to light.  Cardiovascular:     Rate and Rhythm: Normal rate and regular rhythm.     Heart sounds: Normal heart sounds.  Pulmonary:     Effort: Pulmonary effort is normal. No respiratory distress.     Breath sounds: No wheezing.  Abdominal:     General: Bowel sounds are normal. There is no distension.     Palpations: Abdomen is soft. There is no mass.     Tenderness: There is no abdominal tenderness.  Musculoskeletal:        General: No deformity. Normal range of motion.     Cervical back: Normal range of motion and neck supple.     Comments: Bilateral knee replacement.   Skin:    General: Skin is warm and dry.     Findings: No erythema or rash.  Neurological:     Mental Status: He is alert and oriented to person, place, and time. Mental status is at baseline.     Cranial Nerves: No cranial nerve deficit.     Coordination: Coordination normal.  Psychiatric:        Mood and Affect: Mood normal.     LABORATORY DATA:  I have reviewed the data as listed    Latest Ref Rng & Units 03/01/2022    2:12 PM 12/21/2021    2:23 PM 11/23/2021    1:04 PM   CBC  WBC 4.0 - 10.5 K/uL 7.9  10.5  9.9   Hemoglobin 13.0 - 17.0 g/dL 14.8  15.3  14.5   Hematocrit 39.0 - 52.0 % 44.6  45.5  43.8   Platelets 150 - 400 K/uL 230  255  238       Latest Ref Rng & Units 03/01/2022    2:12 PM 12/21/2021    2:23 PM 11/23/2021    1:04 PM  CMP  Glucose 70 - 99 mg/dL 101  130  262   BUN 6 - 20 mg/dL '22  30  27   '$ Creatinine 0.61 - 1.24 mg/dL 1.10  1.37  1.34   Sodium 135 - 145 mmol/L 135  137  136   Potassium 3.5 - 5.1 mmol/L 4.6  4.6  4.7   Chloride 98 - 111 mmol/L 103  104  101   CO2 22 - 32 mmol/L '26  25  24   '$ Calcium 8.9 - 10.3 mg/dL 9.6  10.1  9.4   Total Protein 6.5 - 8.1 g/dL 7.3  7.3  7.3   Total Bilirubin 0.3 - 1.2 mg/dL 0.6  0.5  1.0   Alkaline Phos 38 - 126 U/L 69  63  66   AST 15 - 41 U/L '20  26  26   '$ ALT 0 - 44 U/L '16  21  24     '$ RADIOGRAPHIC STUDIES: I have personally reviewed the radiological images as listed and agreed with the findings in the  report.  CT CHEST ABDOMEN PELVIS WO CONTRAST  Result Date: 02/27/2022 CLINICAL DATA:  Right-sided renal cell carcinoma with nephrectomy in 2028. Lung metastasis, finished immunotherapy. * Tracking Code: BO * EXAM: CT CHEST, ABDOMEN AND PELVIS WITHOUT CONTRAST TECHNIQUE: Multidetector CT imaging of the chest, abdomen and pelvis was performed following the standard protocol without IV contrast. RADIATION DOSE REDUCTION: This exam was performed according to the departmental dose-optimization program which includes automated exposure control, adjustment of the mA and/or kV according to patient size and/or use of iterative reconstruction technique. COMPARISON:  11/16/2021 FINDINGS: CT CHEST FINDINGS Cardiovascular: Aortic atherosclerosis. Normal heart size, without pericardial effusion. Lad coronary artery calcification. Mediastinum/Nodes: No supraclavicular adenopathy. No mediastinal or hilar adenopathy, given limitations of unenhanced CT. Lungs/Pleura: No pleural fluid. Tiny scattered pulmonary nodules are  similar. Examples in the right apex at 3 mm on 30/3 and posterior left upper lobe at 3 mm on 48/3. No enlarging or suspicious dominant nodules identified. Musculoskeletal: No acute osseous abnormality. CT ABDOMEN PELVIS FINDINGS Hepatobiliary: Normal noncontrast appearance of the liver, gallbladder, biliary tract. Pancreas: Normal, without mass or ductal dilatation. Spleen: Normal in size, without focal abnormality. Adrenals/Urinary Tract: Normal adrenal glands. Right nephrectomy with similar soft tissue density in the surgical bed of maximally 2.5 cm on 68/2. Unchanged back to 09/29/2020. Interpolar left renal hyperattenuating 1.4 cm lesion is similar to on the prior exam and can be presumed a hemorrhagic/proteinaceous cyst based on density. Adjacent more posterior interpolar left renal 1.3 cm low-density lesion is likely a cyst and is not significantly changed. No left renal calculi or hydronephrosis. No bladder calculi. Stomach/Bowel: Normal stomach, without wall thickening. Normal colon, appendix, and terminal ileum. Normal small bowel. Vascular/Lymphatic: Aortic atherosclerosis. No abdominopelvic adenopathy. Reproductive: No significant free fluid. Tiny bilateral hydroceles are likely physiologic. Other: No significant free fluid.  No free intraperitoneal air. Musculoskeletal: No acute osseous abnormality. IMPRESSION: 1. Status post right nephrectomy. No findings of metastatic disease within the chest, abdomen, or pelvis. 2. Similar tiny bilateral pulmonary nodules, favoring a benign etiology. 3. Aortic Atherosclerosis (ICD10-I70.0). Coronary artery atherosclerosis. Electronically Signed   By: Abigail Miyamoto M.D.   On: 02/27/2022 15:01

## 2022-04-24 ENCOUNTER — Encounter: Payer: Self-pay | Admitting: Oncology

## 2022-04-28 ENCOUNTER — Encounter: Payer: Self-pay | Admitting: Internal Medicine

## 2022-04-28 ENCOUNTER — Ambulatory Visit: Payer: 59 | Admitting: Internal Medicine

## 2022-04-28 VITALS — BP 122/80 | HR 100 | Temp 99.0°F | Resp 18 | Ht 72.0 in | Wt 210.4 lb

## 2022-04-28 DIAGNOSIS — R051 Acute cough: Secondary | ICD-10-CM

## 2022-04-28 DIAGNOSIS — E1165 Type 2 diabetes mellitus with hyperglycemia: Secondary | ICD-10-CM | POA: Diagnosis not present

## 2022-04-28 DIAGNOSIS — I10 Essential (primary) hypertension: Secondary | ICD-10-CM

## 2022-04-28 DIAGNOSIS — K219 Gastro-esophageal reflux disease without esophagitis: Secondary | ICD-10-CM

## 2022-04-28 DIAGNOSIS — C641 Malignant neoplasm of right kidney, except renal pelvis: Secondary | ICD-10-CM

## 2022-04-28 DIAGNOSIS — E785 Hyperlipidemia, unspecified: Secondary | ICD-10-CM

## 2022-04-28 MED ORDER — METHYLPREDNISOLONE 4 MG PO TBPK: ORAL_TABLET | ORAL | 0 refills | Status: DC

## 2022-04-28 MED ORDER — BENZONATATE 100 MG PO CAPS: 100.0000 mg | ORAL_CAPSULE | Freq: Two times a day (BID) | ORAL | 0 refills | Status: DC | PRN

## 2022-04-28 NOTE — Progress Notes (Signed)
New Patient Office Visit  Subjective    Patient ID: Matthew Woodard, male    DOB: [birth year]  Age: 56 y.o. MRN: [id number]  CC:  Chief Complaint  Patient presents with   Establish Care   Form Completion    HPI Matthew Woodard presents to establish care.  Having acute concerns today.  Patient has been coughing for the last 2 to 3 days.  Patient states it started in his sinuses with rhinorrhea, sinus congestion and pressure.  He states now it is moved to his chest and he has a productive cough.  He denies fevers.  He is taking over-the-counter decongestants and DayQuil/NyQuil.  His wife and sister-in-law are both sick as well with similar symptoms  Hypertension: -Medications: Lisinopril 10 mg  -Patient is compliant with above medications and reports no side effects. -Denies any SOB, CP, vision changes, LE edema or symptoms of hypotension  HLD: -Medications: Crestor 20 mg -Patient is compliant with above medications and reports no side effects.  -Last lipid panel: Lipid Panel     Component Value Date/Time   CHOL 183 06/14/2018 0827   CHOL 157 11/02/2014 0825   CHOL 232 (H) 02/01/2013 0826   TRIG 94 06/14/2018 0827   TRIG 117 02/01/2013 0826   HDL 62 06/14/2018 0827   HDL 50 11/02/2014 0825   HDL 47 02/01/2013 0826   CHOLHDL 3.0 06/14/2018 0827   VLDL 28 01/08/2017 1012   VLDL 23 02/01/2013 0826   LDLCALC 102 (H) 06/14/2018 0827   LDLCALC 162 (H) 02/01/2013 0826   LABVLDL 27 11/02/2014 0825    Diabetes, Type 2: -Last A1c 7.5 5/23 -Medications: Jardiance 10 mg, Ozempic 1 mg weekly, Metformin 500 mg XR TID -Patient is compliant with the above medications and reports no side effects.  -Eye exam: UTD 1/23 -Foot exam: Due  -Microalbumin: Due -Statin: yes -PNA vaccine: discussed, is considering  -Denies symptoms of hypoglycemia, polyuria, polydipsia, numbness extremities, foot ulcers/trauma.   Renal Cell Carcinoma:  -First diagnosed 06/2018, right kidney removed  07/2018; had spread to bladder and lungs but had been in remission for 1 year. -Following with Oncology Dr. Tasia Catchings, note reviewed from 03/01/22 -Planning for CT 07/03/22  GERD/Barrett's Esophagus: -Currently on Prilosec 20 mg PRN  Health Maintenance: -Colon cancer screening: colonoscopy 10/2021, repeat in 7 years -Blood work due   Outpatient Encounter Medications as of 04/28/2022  Medication Sig   diphenoxylate-atropine (LOMOTIL) 2.5-0.025 MG tablet Take 1 tablet by mouth 4 (four) times daily as needed for diarrhea or loose stools.   ALPRAZolam (XANAX) 0.5 MG tablet Take 1 tablet (0.5 mg total) by mouth 2 (two) times daily as needed.   aspirin 81 MG EC tablet Take 81 mg by mouth daily with supper.   clobetasol ointment (TEMOVATE) 7.89 % Apply 1 application topically 2 (two) times daily as needed for rash.   Glucose Blood (ACCU-CHEK AVIVA PLUS VI) by In Vitro route.   JARDIANCE 10 MG TABS tablet TAKE 1 TABLET BY MOUTH DAILY   lisinopril (PRINIVIL,ZESTRIL) 10 MG tablet TAKE 1 TABLET(10 MG) BY MOUTH DAILY   metFORMIN (GLUCOPHAGE-XR) 500 MG 24 hr tablet Take 3 pills by mouth daily for 4-5 days, then increase to 4 pills daily (if tolerated) (Patient taking differently: Take 1,000 mg by mouth daily with supper.)   Multiple Vitamin (MULTIVITAMIN) tablet Take 1 tablet by mouth daily with supper.   omeprazole (PRILOSEC) 20 MG capsule Take 1 capsule (20 mg total) by mouth daily  as needed (acid reflux).   OZEMPIC, 0.25 OR 0.5 MG/DOSE, 2 MG/1.5ML SOPN Inject 1 mg into the skin every Sunday.   rosuvastatin (CRESTOR) 20 MG tablet Take 20 mg by mouth daily with supper.   [DISCONTINUED] budesonide (ENTOCORT EC) 3 MG 24 hr capsule Take 3 capsules (9 mg total) by mouth daily. (Patient not taking: Reported on 12/21/2021)   [DISCONTINUED] cyclobenzaprine (FLEXERIL) 10 MG tablet Take 10 mg by mouth 3 (three) times daily as needed for muscle spasms. (Patient not taking: Reported on 12/21/2021)   No facility-administered  encounter medications on file as of 04/28/2022.    Past Medical History:  Diagnosis Date   Arthritis    hands, left knee   Cancer ([place])    Diabetes mellitus without complication ([place])    Eczema 01/08/2017   GERD (gastroesophageal reflux disease)    History of kidney cancer    Hyperlipidemia LDL goal <100 11/02/2014   Hypertension    Kidney stone    YEAR H/O HEMATURIA   Psoriasis    Wears dentures    full upper, partial lower.  Has, does not wear    Past Surgical History:  Procedure Laterality Date   COLONOSCOPY N/A 10/21/2021   Procedure: COLONOSCOPY;  Surgeon: [name], MD;  Location: [place];  Service: Endoscopy;  Laterality: N/A;   COLONOSCOPY WITH PROPOFOL N/A 02/23/2017   Procedure: COLONOSCOPY WITH PROPOFOL;  Surgeon: [name], MD;  Location: [facility];  Service: Endoscopy;  Laterality: N/A;  Diabetic - oral meds   CYST EXCISION  Aug. 2016   on back   CYSTOSCOPY W/ RETROGRADES Right 06/28/2018   Procedure: CYSTOSCOPY WITH RETROGRADE PYELOGRAM;  Surgeon: Billey Co, MD;  Location: [facility];  Service: Urology;  Laterality: Right;   CYSTOSCOPY W/ URETERAL STENT REMOVAL Right 07/22/2018   Procedure: CYSTOSCOPY WITH STENT REMOVAL;  Surgeon: Billey Co, MD;  Location: [facility];  Service: Urology;  Laterality: Right;   CYSTOSCOPY WITH BIOPSY Right 06/28/2018   Procedure: CYSTOSCOPY WITH RENAL BIOPSY;  Surgeon: Billey Co, MD;  Location: [facility];  Service: Urology;  Laterality: Right;   CYSTOSCOPY WITH URETEROSCOPY AND STENT PLACEMENT Right 06/28/2018   Procedure: CYSTOSCOPY WITH URETEROSCOPY AND STENT PLACEMENT;  Surgeon: Billey Co, MD;  Location: [facility];  Service: Urology;  Laterality: Right;   ESOPHAGOGASTRODUODENOSCOPY N/A 10/21/2021   Procedure: ESOPHAGOGASTRODUODENOSCOPY (EGD);  Surgeon: [name], MD;  Location: [place];  Service: Endoscopy;  Laterality: N/A;   FULGURATION OF BLADDER TUMOR N/A 06/28/2018    Procedure: CYSTOSCOPY BLADDER BIOPSY, FULGERATION OF BLADDER;  Surgeon: Billey Co, MD;  Location: [facility];  Service: Urology;  Laterality: N/A;   KNEE SURGERY Left    LAPAROSCOPIC NEPHRECTOMY, HAND ASSISTED Right 07/22/2018   Procedure: HAND ASSISTED LAPAROSCOPIC NEPHRECTOMY;  Surgeon: Billey Co, MD;  Location: [facility];  Service: Urology;  Laterality: Right;   NASAL SINUS SURGERY  03/15   POLYPECTOMY N/A 02/23/2017   Procedure: POLYPECTOMY;  Surgeon: [name], MD;  Location: [place];  Service: Endoscopy;  Laterality: N/A;   POLYPECTOMY  10/21/2021   Procedure: POLYPECTOMY;  Surgeon: [name], MD;  Location: [place];  Service: Endoscopy;;   tooth abstraction     TRANSURETHRAL RESECTION OF BLADDER TUMOR N/A 05/12/2019   Procedure: TRANSURETHRAL RESECTION OF BLADDER TUMOR (TURBT);  Surgeon: Billey Co, MD;  Location: [facility];  Service: Urology;  Laterality: N/A;   VARICOCELE EXCISION     XI  ROBOTIC ASSISTED VENTRAL HERNIA N/A 11/18/2020   Procedure: XI ROBOTIC ASSISTED VENTRAL HERNIA, incisional;  Surgeon: Jules Husbands, MD;  Location: [facility];  Service: General;  Laterality: N/A;    Family History  Problem Relation Age of Onset   Heart disease Father    Hypertension Father    COPD Father    Colon cancer Maternal Grandmother    Heart attack Paternal Grandfather     Social History   Socioeconomic History   Marital status: Married    Spouse name: Alma Friendly   Number of children: Not on file   Years of education: Not on file   Highest education level: Not on file  Occupational History   Not on file  Tobacco Use   Smoking status: Former    Types: Cigarettes    Quit date: 06/15/2013    Years since quitting: 8.8   Smokeless tobacco: Never  Vaping Use   Vaping Use: Never used  Substance and Sexual Activity   Alcohol use: No    Alcohol/week: 0.0 standard drinks of alcohol   Drug use: No   Sexual activity: Yes  Other Topics Concern    Not on file  Social History Narrative   Lives with wife, sister in law   Social Determinants of Health   Financial Resource Strain: Not on file  Food Insecurity: Not on file  Transportation Needs: Not on file  Physical Activity: Not on file  Stress: Not on file  Social Connections: Not on file  Intimate Partner Violence: Not on file    Review of Systems  Constitutional:  Negative for chills and fever.  Eyes:  Negative for blurred vision.  Respiratory:  Positive for cough and sputum production. Negative for shortness of breath and wheezing.   Cardiovascular:  Negative for chest pain.  Neurological:  Negative for headaches.        Objective    BP 122/80   Pulse 100   Temp 99 F (37.2 C)   Resp 18   Ht 6' (1.829 m)   Wt 210 lb 6.4 oz (95.4 kg)   SpO2 96%   BMI 28.54 kg/m   Physical Exam Constitutional:      Appearance: Normal appearance.  HENT:     Head: Normocephalic and atraumatic.  Eyes:     Conjunctiva/sclera: Conjunctivae normal.  Cardiovascular:     Rate and Rhythm: Normal rate and regular rhythm.  Pulmonary:     Effort: Pulmonary effort is normal.     Breath sounds: Normal breath sounds. No wheezing, rhonchi or rales.  Skin:    General: Skin is warm and dry.  Neurological:     General: No focal deficit present.     Mental Status: He is alert. Mental status is at baseline.  Psychiatric:        Mood and Affect: Mood normal.        Behavior: Behavior normal.         Assessment & Plan:   1. Benign essential HTN: Chronic and stable.  Blood pressure at goal today.  Continue lisinopril 10 mg daily.  2. Hyperlipidemia LDL goal <100: Chronic.  Continue Crestor 20 mg daily.  Obtain fasting lipid panel today for form completion.  - Lipid panel  3. Type 2 diabetes mellitus with hyperglycemia, without long-term current use of insulin ([place]): A1c due as well as urine microalbumin.  Continue Jardiance 10 mg, Ozempic 1 mg weekly and metformin 500 mg  extended release 3 times daily.  -  HgB A1c - Urine Microalbumin w/creat. ratio  4. 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  Planning on repeating CT scan in February.  Will follow along with results.  5. Gastroesophageal reflux disease, unspecified whether esophagitis present: Stable.  Continue Ozempic 20 mg as needed.  6. Acute cough: Lung exam without abnormalities, do not think he needs a chest x-ray at this point.  Will swab for COVID.  I will send a prescription for a Medrol Dosepak to his pharmacy to be filled over the weekend if his symptoms worsen.  Continue to take decongestant and cough suppression.  Follow-up if symptoms worsen or fail to improve.  - methylPREDNISolone (MEDROL DOSEPAK) 4 MG TBPK tablet; Day 1: Take 8 mg (2 tablets) before breakfast, 4 mg (1 tablet) after lunch, 4 mg (1 tablet) after supper, and 8 mg (2 tablets) at bedtime. Day 2:Take 4 mg (1 tablet) before breakfast, 4 mg (1 tablet) after lunch, 4 mg (1 tablet) after supper, and 8 mg (2 tablets) at bedtime. Day 3: Take 4 mg (1 tablet) before breakfast, 4 mg (1 tablet) after lunch, 4 mg (1 tablet) after supper, and 4 mg (1 tablet) at bedtime. Day 4: Take 4 mg (1 tablet) before breakfast, 4 mg (1 tablet) after lunch, and 4 mg (1 tablet) at bedtime. Day 5: Take 4 mg (1 tablet) before breakfast and 4 mg (1 tablet) at bedtime. Day 6: Take 4 mg (1 tablet) before breakfast.  Dispense: 1 each; Refill: 0 - Novel Coronavirus, NAA ([facility]) - benzonatate (TESSALON) 100 MG capsule; Take 1 capsule (100 mg total) by mouth 2 (two) times daily as needed for cough.  Dispense: 20 capsule; Refill: 0   Return in about 3 months (around 07/28/2022).   Teodora Medici, DO

## 2022-04-28 NOTE — Patient Instructions (Signed)
It was great seeing you today!  Plan discussed at today's visit: -Blood work ordered today, results will be uploaded to [place].  -No changes to medications -COVID test today, steroids sent to pharmacy. Let me know if symptoms worsen or fail to improve -Once I get your labs results I will fill out and fax your paper -Consider pneumonia vaccine for next time   Follow up in: 3 months   Take care and let us know if you have any questions or concerns prior to your next visit.  Dr. Rosana Berger

## 2022-04-29 LAB — LIPID PANEL
Cholesterol: 134 mg/dL (ref <200)
HDL: 46 mg/dL (ref >=40)
LDL Cholesterol (Calc): 69 mg/dL (calc)
Non-HDL Cholesterol (Calc): 88 mg/dL (calc) (ref <130)
Total CHOL/HDL Ratio: 2.9 (calc) (ref <5.0)
Triglycerides: 103 mg/dL (ref <150)

## 2022-04-29 LAB — MICROALBUMIN / CREATININE URINE RATIO
Creatinine, Urine: 223 mg/dL (ref 20–320)
Microalb Creat Ratio: 178 mcg/mg creat — ABNORMAL HIGH (ref <30)
Microalb, Ur: 39.8 mg/dL

## 2022-04-29 LAB — HEMOGLOBIN A1C
Hgb A1c MFr Bld: 7.4 % of total Hgb — ABNORMAL HIGH (ref <5.7)
Mean Plasma Glucose: 166 mg/dL
eAG (mmol/L): 9.2 mmol/L

## 2022-05-01 LAB — SPECIMEN STATUS REPORT

## 2022-05-01 LAB — NOVEL CORONAVIRUS, NAA: SARS-CoV-2, NAA: NOT DETECTED

## 2022-05-01 MED ORDER — EMPAGLIFLOZIN 25 MG PO TABS: 25.0000 mg | ORAL_TABLET | Freq: Every day | ORAL | 1 refills | Status: DC

## 2022-05-01 NOTE — Addendum Note (Signed)
Addended by: Teodora Medici on: 05/01/2022 09:07 AM   Modules accepted: Orders

## 2022-05-24 ENCOUNTER — Other Ambulatory Visit: Payer: Self-pay | Admitting: Oncology

## 2022-05-25 NOTE — Telephone Encounter (Signed)
Patient sent request via My Chart and said that he takes half a tablet most nights at bedtime to help him sleep

## 2022-05-31 MED ORDER — ALPRAZOLAM 0.5 MG PO TABS: 0.5000 mg | ORAL_TABLET | Freq: Every evening | ORAL | 0 refills | Status: DC | PRN

## 2022-06-06 LAB — HM DIABETES EYE EXAM

## 2022-07-03 ENCOUNTER — Ambulatory Visit
Admission: RE | Admit: 2022-07-03 | Discharge: 2022-07-03 | Disposition: A | Discharge status: Home / Self Care | Payer: 59 | Source: Ambulatory Visit | Attending: Oncology | Admitting: Oncology

## 2022-07-03 DIAGNOSIS — C641 Malignant neoplasm of right kidney, except renal pelvis: Secondary | ICD-10-CM | POA: Insufficient documentation

## 2022-07-03 LAB — POCT I-STAT CREATININE: Creatinine, Ser: 1.1 mg/dL (ref 0.61–1.24)

## 2022-07-03 MED ORDER — IOHEXOL 300 MG/ML  SOLN
100.0000 mL | Freq: Once | INTRAMUSCULAR | Status: AC | PRN
  Administered 2022-07-03: 100 mL via INTRAVENOUS

## 2022-07-06 ENCOUNTER — Inpatient Hospital Stay: Payer: 59 | Admitting: Oncology

## 2022-07-06 ENCOUNTER — Inpatient Hospital Stay: Payer: 59 | Attending: Oncology

## 2022-07-06 ENCOUNTER — Encounter: Payer: Self-pay | Admitting: Oncology

## 2022-07-06 VITALS — BP 136/92 | HR 78 | Temp 96.5°F | Resp 18 | Wt 213.8 lb

## 2022-07-06 DIAGNOSIS — Z9221 Personal history of antineoplastic chemotherapy: Secondary | ICD-10-CM | POA: Insufficient documentation

## 2022-07-06 DIAGNOSIS — Z905 Acquired absence of kidney: Secondary | ICD-10-CM | POA: Insufficient documentation

## 2022-07-06 DIAGNOSIS — L409 Psoriasis, unspecified: Secondary | ICD-10-CM

## 2022-07-06 DIAGNOSIS — Z85528 Personal history of other malignant neoplasm of kidney: Secondary | ICD-10-CM | POA: Insufficient documentation

## 2022-07-06 DIAGNOSIS — Z87891 Personal history of nicotine dependence: Secondary | ICD-10-CM | POA: Insufficient documentation

## 2022-07-06 DIAGNOSIS — C641 Malignant neoplasm of right kidney, except renal pelvis: Secondary | ICD-10-CM

## 2022-07-06 LAB — COMPREHENSIVE METABOLIC PANEL
ALT: 14 U/L (ref 0–44)
AST: 20 U/L (ref 15–41)
Albumin: 4.4 g/dL (ref 3.5–5.0)
Alkaline Phosphatase: 74 U/L (ref 38–126)
Anion gap: 9 (ref 5–15)
BUN: 19 mg/dL (ref 6–20)
CO2: 26 mmol/L (ref 22–32)
Calcium: 9.4 mg/dL (ref 8.9–10.3)
Chloride: 101 mmol/L (ref 98–111)
Creatinine, Ser: 1.24 mg/dL (ref 0.61–1.24)
GFR, Estimated: >60 mL/min (ref >60)
Glucose, Bld: 108 mg/dL — ABNORMAL HIGH (ref 70–99)
Potassium: 4.5 mmol/L (ref 3.5–5.1)
Sodium: 136 mmol/L (ref 135–145)
Total Bilirubin: 0.7 mg/dL (ref 0.3–1.2)
Total Protein: 7.5 g/dL (ref 6.5–8.1)

## 2022-07-06 LAB — CBC WITH DIFFERENTIAL/PLATELET
Abs Immature Granulocytes: 0.02 10*3/uL (ref 0.00–0.07)
Basophils Absolute: 0 10*3/uL (ref 0.0–0.1)
Basophils Relative: 0 %
Eosinophils Absolute: 0.1 10*3/uL (ref 0.0–0.5)
Eosinophils Relative: 2 %
HCT: 45.7 % (ref 39.0–52.0)
Hemoglobin: 15.1 g/dL (ref 13.0–17.0)
Immature Granulocytes: 0 %
Lymphocytes Relative: 31 %
Lymphs Abs: 2.4 10*3/uL (ref 0.7–4.0)
MCH: 29.6 pg (ref 26.0–34.0)
MCHC: 33 g/dL (ref 30.0–36.0)
MCV: 89.6 fL (ref 80.0–100.0)
Monocytes Absolute: 0.6 10*3/uL (ref 0.1–1.0)
Monocytes Relative: 8 %
Neutro Abs: 4.5 10*3/uL (ref 1.7–7.7)
Neutrophils Relative %: 59 %
Platelets: 235 10*3/uL (ref 150–400)
RBC: 5.1 MIL/uL (ref 4.22–5.81)
RDW: 12.9 % (ref 11.5–15.5)
WBC: 7.6 10*3/uL (ref 4.0–10.5)
nRBC: 0 % (ref 0.0–0.2)

## 2022-07-06 LAB — LACTATE DEHYDROGENASE: LDH: 109 U/L (ref 98–192)

## 2022-07-06 NOTE — Assessment & Plan Note (Addendum)
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 Currently off immunotherapy Surveillance CT scan was reviewed with patient and wife. -NED.  Recommend continue surveillance imaging. Repeat CT in 6 months.

## 2022-07-06 NOTE — Progress Notes (Signed)
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 Currently off immunotherapy Surveillance CT scan was reviewed with patient and wife. -NED.  Recommend continue surveillance imaging. Repeat CT in 6 months.   Psoriasis Follow up with dermatology.  On topical steroid cream PRN   Orders Placed This Encounter  Procedures   CT CHEST ABDOMEN PELVIS WO CONTRAST    Standing Status:   Future    Standing Expiration Date:   07/07/2023    Scheduling Instructions:     To be scheduled in approx 6 months ( a few days prior to dr. Collie Siad visit)    Order Specific Question:   If indicated for the ordered procedure, I authorize the administration of contrast media per Radiology protocol    Answer:   Yes    Order Specific Question:   Preferred imaging location?    Answer:   [facility]    Order Specific Question:   Is Oral Contrast requested for this exam?    Answer:   Yes, Per Radiology protocol   CBC with Differential ([place] Only)    Standing Status:   Future    Standing Expiration Date:   07/07/2023   CMP ([place] only)    Standing Status:   Future    Standing Expiration Date:   07/07/2023   Lactate dehydrogenase    Standing Status:   Future    Standing Expiration Date:   07/07/2023   Follow up in 6 month All questions were answered. The patient knows to call the clinic with any problems, questions or concerns.  Earlie Server, MD, PhD [facility] Hematology Oncology 07/06/2022     HISTORY OF PRESENTING ILLNESS:   Matthew Woodard is a  57 y.o.  male presents for follow up for metastatic kidney cancer Oncology History  Metastatic renal cell carcinoma ([place])  06/2018 Imaging   CT urogram done which showed right 4 cm central enhancing renal  mass with invasion into the collecting system. X-ray of chest was performed to see complete staging which showed no concerning findings   06/28/2018 Cancer Diagnosis      06/28/2018 Procedure    patient underwent a cystoscopy, bladder biopsy and a right retrograde pyelogram with intraoperative interpretation, right diagnostic ureteroscopy, right renal pelvis biopsy, right ureteral stent placement.  biopsy showed atypical cell clusters, with extensive crush artifact, nondiagnostic.     07/22/2018 Initial Diagnosis   Patient underwent right radical laparoscopic nephrectomy with biopsy showing 5.3 cm RCC, clear cell type, grade 3, tumor invades renal vein and segmental branches, pelvic calyceal system and perirenal sinus/fat.  Surgical margins negative for tumor.pT3a,Nx Post-operation, patient was on surveillance after surgery.   10/28/2018 Imaging   CT abdomen pelvis with contrast showed minimal fluid and or postoperative changes within the right renal fossa.  No evidence of metastatic disease or recurrence.  Patient has left kidney lesion previously characterized as nonenhancing cyst by multi phasic contrast-enhanced CT.   04/30/2019 Progression   Patient reports an episode of gross hematuria in early December 2020, patient is due for 33-monthsurveillance CT scan. 04/30/2019 CT abdomen pelvis with contrast showed high attenuation mass at the right uretero vesicle junction, with extension into the bladder.  Bilateral pulmonary nodules, most indicated for metastatic disease.  Patient underwent cystoscopy and transurethral resection  of irregular bladder tumor 3 cm.  Mass appeared to emanate from the right ureteral orifice.  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  #NGS: [employer] PD L1 TPS 1%   05/28/2019 Imaging   CT chest w contrast 1. Numerous bilateral pulmonary nodules measuring up to 10 mm diameter. Imaging features compatible with metastatic  disease. 2. Left hilar lymphadenopathy, also highly suspicious for metastatic involvement.    05/28/2019 Imaging   Bone scan showed  No scintigraphic evidence of osseous metastatic disease.    06/05/2019 - 06/21/2021 Chemotherapy   06/05/19 Axitinib 70m BID+ Keytruda  09/2019 off axitinib and Keytruda since the beginning of May due to transaminitis. GI work up negative.  Ultrasound liver vascular Doppler is negative #11/24/2019, resumed on Keytruda every 3 weeks #01/05/2020, axitinib was resumed 3 mg #01/20/2020, axitinib was discontinued due to recurrence of transaminitis.  KBeryle Flockwas temporarily held. #03/16/2020-06/21/2021 resumed on Keytruda every 3 weeks KBeryle Flockwas discontinued due to recurrent immunotherapy induced diarrhea    07/25/2019 Imaging   Brain w wo contrast No mass, hemorrhage, or abnormal enhancement.   09/29/2020 Imaging   CT scan showed no evidence of local recurrence or new/progressive disease in the chest abdomen or pelvis.  Tiny nonspecific lung nodules are unchanged.  No new lung nodules   01/14/2021 Imaging   CT chest abdomen pelvis showed stable exam, no new or progressive metastatic disease within chest abdomen pelvis. Stable lung nodules. Aortic atherosclerosis.    04/15/2021 Imaging   CT chest abdomen pelvis with contrast showed stable examination.  No evidence of recurrent or metastatic carcinoma within the chest abdomen pelvis.     08/02/2021 Imaging   CT chest abdomen pelvis without contrast, stable examination without evidence of recurrence or metastatic disease.  Mild asymmetric diffuse esophageal wall thickening, similar to prior examination.  Likely chronic esophagitis.  Aortic atherosclerosis   10/21/2021 Procedure   colonoscopy showed 3 mm polyp which was resected and retrieved.  Negative for dysplasia/malignancy.  Random biopsy showed lymphocytic colitis. EGD showed long segment Barrett's esophagus.  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  Negative for dysplasia and malignancy.  Small bowel cold biopsy showed reactive duodenitis.  Negative for CMV.  Negative for dysplasia or malignancy.   11/16/2021 Imaging   CT chest abdomen pelvis without contrast showed stable examination without evidence of local recurrence or active malignant disease in the chest abdomen pelvis.  Reflux retained contrast in the esophagus suggestive of esophagitis.  Aortic atherosclerosis   07/03/2022 Imaging   CT chest abdomen pelvis w contrast   1. Stable examination status post right nephrectomy without evidence of new or progressive disease in the chest, abdomen or pelvis. 2. Stable scattered tiny pulmonary nodules favored benign etiology. 3.  Aortic Atherosclerosis    Clear cell carcinoma of right kidney (HCC)     #History of cutaneous psoriasis, mostly on his right hand,    INTERVAL HISTORY Matthew Woodard is a 57y.o. male who has above history reviewed by me today presents for follow up visit for management of metastatic RCC. Patient reports doing well. No diarrhea. No new complaints. Accompanied by his wife.  Insomnia symptom has improved.  Intermittent acid reflex, he takes PPI.   Review of Systems  Constitutional:  Negative for appetite change, chills, fatigue, fever and unexpected weight change.  HENT:   Negative for hearing loss and voice change.   Eyes:  Negative for eye problems and icterus.  Respiratory:  Negative for chest tightness, cough and shortness of breath.   Cardiovascular:  Negative for chest pain and leg swelling.  Gastrointestinal:  Negative for abdominal distention, abdominal pain, blood in stool, diarrhea and nausea.  Endocrine: Negative for hot flashes.  Genitourinary:  Negative for difficulty urinating, dysuria and frequency.   Musculoskeletal:        Bilateral knee replacement.   Skin:  Negative for itching and rash.  Neurological:  Negative for extremity weakness, headaches,  light-headedness and numbness.  Hematological:  Negative for adenopathy. Does not bruise/bleed easily.  Psychiatric/Behavioral:  Negative for confusion.     MEDICAL HISTORY:  Past Medical History:  Diagnosis Date   Arthritis    hands, left knee   Cancer ([place])    Diabetes mellitus without complication ([place])    Eczema 01/08/2017   GERD (gastroesophageal reflux disease)    History of kidney cancer    Hyperlipidemia LDL goal <100 11/02/2014   Hypertension    Kidney stone    YEAR H/O HEMATURIA   Psoriasis    Wears dentures    full upper, partial lower.  Has, does not wear    SURGICAL HISTORY: Past Surgical History:  Procedure Laterality Date   COLONOSCOPY N/A 10/21/2021   Procedure: COLONOSCOPY;  Surgeon: [name], MD;  Location: [place];  Service: Endoscopy;  Laterality: N/A;   COLONOSCOPY WITH PROPOFOL N/A 02/23/2017   Procedure: COLONOSCOPY WITH PROPOFOL;  Surgeon: [name], MD;  Location: [place];  Service: Endoscopy;  Laterality: N/A;  Diabetic - oral meds   CYST EXCISION  Aug. 2016   on back   CYSTOSCOPY W/ RETROGRADES Right 06/28/2018   Procedure: CYSTOSCOPY WITH RETROGRADE PYELOGRAM;  Surgeon: Billey Co, MD;  Location: [facility];  Service: Urology;  Laterality: Right;   CYSTOSCOPY W/ URETERAL STENT REMOVAL Right 07/22/2018   Procedure: CYSTOSCOPY WITH STENT REMOVAL;  Surgeon: Billey Co, MD;  Location: [facility];  Service: Urology;  Laterality: Right;   CYSTOSCOPY WITH BIOPSY Right 06/28/2018   Procedure: CYSTOSCOPY WITH RENAL BIOPSY;  Surgeon: Billey Co, MD;  Location: [facility];  Service: Urology;  Laterality: Right;   CYSTOSCOPY WITH URETEROSCOPY AND STENT PLACEMENT Right 06/28/2018   Procedure: CYSTOSCOPY WITH URETEROSCOPY AND STENT PLACEMENT;  Surgeon: Billey Co, MD;  Location: [facility];  Service: Urology;  Laterality: Right;   ESOPHAGOGASTRODUODENOSCOPY N/A 10/21/2021   Procedure: ESOPHAGOGASTRODUODENOSCOPY (EGD);   Surgeon: [name], MD;  Location: [place];  Service: Endoscopy;  Laterality: N/A;   FULGURATION OF BLADDER TUMOR N/A 06/28/2018   Procedure: CYSTOSCOPY BLADDER BIOPSY, FULGERATION OF BLADDER;  Surgeon: Billey Co, MD;  Location: [facility];  Service: Urology;  Laterality: N/A;   KNEE SURGERY Left    LAPAROSCOPIC NEPHRECTOMY, HAND ASSISTED Right 07/22/2018   Procedure: HAND ASSISTED LAPAROSCOPIC NEPHRECTOMY;  Surgeon: Billey Co, MD;  Location: [facility];  Service: Urology;  Laterality: Right;   NASAL SINUS SURGERY  03/15   POLYPECTOMY N/A 02/23/2017   Procedure: POLYPECTOMY;  Surgeon: [name], MD;  Location: [place];  Service: Endoscopy;  Laterality: N/A;   POLYPECTOMY  10/21/2021   Procedure: POLYPECTOMY;  Surgeon: [name], MD;  Location: [place];  Service: Endoscopy;;   tooth abstraction     TRANSURETHRAL RESECTION OF BLADDER TUMOR N/A 05/12/2019   Procedure: TRANSURETHRAL RESECTION OF BLADDER TUMOR (TURBT);  Surgeon: Billey Co, MD;  Location: [facility];  Service: Urology;  Laterality: N/A;   VARICOCELE  EXCISION     XI ROBOTIC ASSISTED VENTRAL HERNIA N/A 11/18/2020   Procedure: XI ROBOTIC ASSISTED VENTRAL HERNIA, incisional;  Surgeon: Jules Husbands, MD;  Location: [facility];  Service: General;  Laterality: N/A;    SOCIAL HISTORY: Social History   Socioeconomic History   Marital status: Married    Spouse name: Alma Friendly   Number of children: Not on file   Years of education: Not on file   Highest education level: Not on file  Occupational History   Not on file  Tobacco Use   Smoking status: Former    Types: Cigarettes    Quit date: 06/15/2013    Years since quitting: 9.0   Smokeless tobacco: Never  Vaping Use   Vaping Use: Never used  Substance and Sexual Activity   Alcohol use: No    Alcohol/week: 0.0 standard drinks of alcohol   Drug use: No   Sexual activity: Yes  Other Topics Concern   Not on file  Social History  Narrative   Lives with wife, sister in law   Social Determinants of Health   Financial Resource Strain: Not on file  Food Insecurity: Not on file  Transportation Needs: Not on file  Physical Activity: Not on file  Stress: Not on file  Social Connections: Not on file  Intimate Partner Violence: Not on file    FAMILY HISTORY: Family History  Problem Relation Age of Onset   Heart disease Father    Hypertension Father    COPD Father    Colon cancer Maternal Grandmother    Heart attack Paternal Grandfather     ALLERGIES:  is allergic to axitinib, propofol, and glucotrol [glipizide].  MEDICATIONS:  Current Outpatient Medications  Medication Sig Dispense Refill   ALPRAZolam (XANAX) 0.5 MG tablet Take 1 tablet (0.5 mg total) by mouth at bedtime as needed. 60 tablet 0   aspirin 81 MG EC tablet Take 81 mg by mouth daily with supper.     clobetasol ointment (TEMOVATE) [id number] % Apply 1 application topically 2 (two) times daily as needed for rash.     empagliflozin (JARDIANCE) 25 MG TABS tablet Take 1 tablet (25 mg total) by mouth daily before breakfast. 90 tablet 1   Glucose Blood (ACCU-CHEK AVIVA PLUS VI) by In Vitro route.     lisinopril (PRINIVIL,ZESTRIL) 10 MG tablet TAKE 1 TABLET(10 MG) BY MOUTH DAILY 90 tablet 1   metFORMIN (GLUCOPHAGE-XR) 500 MG 24 hr tablet Take 3 pills by mouth daily for 4-5 days, then increase to 4 pills daily (if tolerated) (Patient taking differently: Take 1,000 mg by mouth daily with supper.) 360 tablet 0   omeprazole (PRILOSEC) 20 MG capsule Take 1 capsule (20 mg total) by mouth daily as needed (acid reflux). 30 capsule 1   OZEMPIC, 0.25 OR 0.5 MG/DOSE, 2 MG/1.5ML SOPN Inject 1 mg into the skin every Sunday.     rosuvastatin (CRESTOR) 20 MG tablet Take 20 mg by mouth daily with supper.     benzonatate (TESSALON) 100 MG capsule Take 1 capsule (100 mg total) by mouth 2 (two) times daily as needed for cough. (Patient not taking: Reported on 07/06/2022) 20 capsule 0    diphenoxylate-atropine (LOMOTIL) 2.5-0.025 MG tablet Take 1 tablet by mouth 4 (four) times daily as needed for diarrhea or loose stools. (Patient not taking: Reported on 07/06/2022) 60 tablet 1   methylPREDNISolone (MEDROL DOSEPAK) 4 MG TBPK tablet Day 1: Take 8 mg (2 tablets) before breakfast, 4 mg (1 tablet) after  lunch, 4 mg (1 tablet) after supper, and 8 mg (2 tablets) at bedtime. Day 2:Take 4 mg (1 tablet) before breakfast, 4 mg (1 tablet) after lunch, 4 mg (1 tablet) after supper, and 8 mg (2 tablets) at bedtime. Day 3: Take 4 mg (1 tablet) before breakfast, 4 mg (1 tablet) after lunch, 4 mg (1 tablet) after supper, and 4 mg (1 tablet) at bedtime. Day 4: Take 4 mg (1 tablet) before breakfast, 4 mg (1 tablet) after lunch, and 4 mg (1 tablet) at bedtime. Day 5: Take 4 mg (1 tablet) before breakfast and 4 mg (1 tablet) at bedtime. Day 6: Take 4 mg (1 tablet) before breakfast. (Patient not taking: Reported on 07/06/2022) 1 each 0   Multiple Vitamin (MULTIVITAMIN) tablet Take 1 tablet by mouth daily with supper. (Patient not taking: Reported on 07/06/2022)     No current facility-administered medications for this visit.     PHYSICAL EXAMINATION: ECOG PERFORMANCE STATUS: 1 - Symptomatic but completely ambulatory Vitals:   07/06/22 1105  BP: (!) 136/92  Pulse: 78  Resp: 18  Temp: (!) 96.5 F (35.8 C)  SpO2: 100%   Filed Weights   07/06/22 1105  Weight: 213 lb 12.8 oz (97 kg)    Physical Exam Constitutional:      General: He is not in acute distress. HENT:     Head: Normocephalic and atraumatic.  Eyes:     General: No scleral icterus.    Pupils: Pupils are equal, round, and reactive to light.  Cardiovascular:     Rate and Rhythm: Normal rate and regular rhythm.     Heart sounds: Normal heart sounds.  Pulmonary:     Effort: Pulmonary effort is normal. No respiratory distress.     Breath sounds: No wheezing.  Abdominal:     General: Bowel sounds are normal. There is no distension.      Palpations: Abdomen is soft. There is no mass.     Tenderness: There is no abdominal tenderness.  Musculoskeletal:        General: No deformity. Normal range of motion.     Cervical back: Normal range of motion and neck supple.     Comments: Bilateral knee replacement.   Skin:    General: Skin is warm and dry.     Findings: No erythema or rash.  Neurological:     Mental Status: He is alert and oriented to person, place, and time. Mental status is at baseline.     Cranial Nerves: No cranial nerve deficit.     Coordination: Coordination normal.  Psychiatric:        Mood and Affect: Mood normal.     LABORATORY DATA:  I have reviewed the data as listed    Latest Ref Rng & Units 07/06/2022   10:29 AM 03/01/2022    2:12 PM 12/21/2021    2:23 PM  CBC  WBC 4.0 - 10.5 K/uL 7.6  7.9  10.5   Hemoglobin 13.0 - 17.0 g/dL 15.1  14.8  15.3   Hematocrit 39.0 - 52.0 % 45.7  44.6  45.5   Platelets 150 - 400 K/uL 235  230  255       Latest Ref Rng & Units 07/06/2022   10:29 AM 07/03/2022   10:16 AM 03/01/2022    2:12 PM  CMP  Glucose 70 - 99 mg/dL 108   101   BUN 6 - 20 mg/dL 19   22   Creatinine 0.61 - 1.24  mg/dL 1.24  1.10  1.10   Sodium 135 - 145 mmol/L 136   135   Potassium 3.5 - 5.1 mmol/L 4.5   4.6   Chloride 98 - 111 mmol/L 101   103   CO2 22 - 32 mmol/L 26   26   Calcium 8.9 - 10.3 mg/dL 9.4   9.6   Total Protein 6.5 - 8.1 g/dL 7.5   7.3   Total Bilirubin 0.3 - 1.2 mg/dL 0.7   0.6   Alkaline Phos 38 - 126 U/L 74   69   AST 15 - 41 U/L 20   20   ALT 0 - 44 U/L 14   16     RADIOGRAPHIC STUDIES: I have personally reviewed the radiological images as listed and agreed with the findings in the report.  CT CHEST ABDOMEN PELVIS W CONTRAST  Result Date: 07/03/2022 CLINICAL DATA:  History of right clear cell renal cell carcinoma status post total nephrectomy March 2020. Metastatic to the lung status post [facility] immunotherapy. * Tracking Code: BO * EXAM: CT CHEST, ABDOMEN, AND  PELVIS WITH CONTRAST TECHNIQUE: Multidetector CT imaging of the chest, abdomen and pelvis was performed following the standard protocol during bolus administration of intravenous contrast. RADIATION DOSE REDUCTION: This exam was performed according to the departmental dose-optimization program which includes automated exposure control, adjustment of the mA and/or kV according to patient size and/or use of iterative reconstruction technique. CONTRAST:  163m OMNIPAQUE IOHEXOL 300 MG/ML  SOLN COMPARISON:  Multiple priors including most recent CT February 27, 2022. FINDINGS: CT CHEST FINDINGS Cardiovascular: Normal caliber thoracic aorta. Normal size heart. No significant pericardial effusion/thickening Mediastinum/Nodes: No suspicious thyroid nodule. No pathologically enlarged mediastinal or hilar lymph nodes. The esophagus is grossly unremarkable. Lungs/Pleura: Stable scattered tiny pulmonary nodules. No new suspicious pulmonary nodules or masses previously indexed nodules are as follows: -nodule in the right lung apex measures 3 mm on image 31/3, unchanged. -Left upper lobe pulmonary nodule measures 3 mm on image 47/3, unchanged No pleural effusion.  No pneumothorax. Musculoskeletal: No aggressive lytic or blastic lesion of bone. CT ABDOMEN PELVIS FINDINGS Hepatobiliary: No suspicious hepatic lesion. Gallbladder is unremarkable. No biliary ductal dilation. Pancreas: No pancreatic ductal dilation or evidence of acute inflammation. Spleen: No splenomegaly. Adrenals/Urinary Tract: Bilateral adrenal glands appear normal. Right kidney surgically absent without new suspicious enhancing soft tissue in the nephrectomy bed. Similar soft tissue density in the posterior aspect of the surgical bed measuring 2.5 cm in maximum axial dimension on image 68/2, unchanged and stable dating back to Sep 29, 2020. Stable hyperintense waiting 14 mm left interpolar renal lesion on image 71/2 previously characterized as a benign  hemorrhagic/proteinaceous cyst on prior noncontrast enhanced examination from February 27, 2022. Adjacent hypodense lesion best seen on delayed image 17/9 measuring 13 mm is stable from prior and likely reflects a cyst. Left kidney demonstrates appropriate enhancement and excretion of contrast material. Urinary bladder is unremarkable for degree of distension. Stomach/Bowel: Stomach is unremarkable for degree of distension. No pathologic dilation of small or large bowel. Normal appendix and terminal ileum. No evidence of acute bowel inflammation. Vascular/Lymphatic: Aortic atherosclerosis. Smooth IVC contours. No pathologically enlarged abdominal or pelvic lymph nodes. Reproductive: Prostate is unremarkable. Other: No significant abdominopelvic free fluid. Musculoskeletal: No aggressive lytic or blastic lesion of bone. Multilevel degenerative changes spine. IMPRESSION: 1. Stable examination status post right nephrectomy without evidence of new or progressive disease in the chest, abdomen or pelvis. 2. Stable scattered tiny pulmonary  nodules favored benign etiology. 3.  Aortic Atherosclerosis (ICD10-I70.0). Electronically Signed   By: Dahlia Bailiff M.D.   On: 07/03/2022 14:13

## 2022-07-06 NOTE — Assessment & Plan Note (Signed)
Follow up with dermatology.  On topical steroid cream PRN

## 2022-07-07 ENCOUNTER — Other Ambulatory Visit: Payer: Self-pay

## 2022-07-27 NOTE — Progress Notes (Signed)
Established Patient Office Visit  Subjective    Patient ID: Matthew Woodard, male    DOB: [birth year]  Age: 57 y.o. MRN: [id number]  CC:  Chief Complaint  Patient presents with   Follow-up    HPI Matthew Woodard presents to follow up. Reports no changes since last visit.   Hypertension: -Medications: Lisinopril 10 mg  -Patient is compliant with above medications and reports no side effects. -Denies any SOB, CP, vision changes, LE edema or symptoms of hypotension  HLD: -Medications: Crestor 20 mg -Patient is compliant with above medications and reports no side effects.  -Last lipid panel: Lipid Panel     Component Value Date/Time   CHOL 134 04/28/2022 1157   CHOL 157 11/02/2014 0825   CHOL 232 (H) 02/01/2013 0826   TRIG 103 04/28/2022 1157   TRIG 117 02/01/2013 0826   HDL 46 04/28/2022 1157   HDL 50 11/02/2014 0825   HDL 47 02/01/2013 0826   CHOLHDL 2.9 04/28/2022 1157   VLDL 28 01/08/2017 1012   VLDL 23 02/01/2013 0826   LDLCALC 69 04/28/2022 1157   LDLCALC 162 (H) 02/01/2013 0826   LABVLDL 27 11/02/2014 0825    Diabetes, Type 2: -Last A1c 7.4 12/23 -Medications: Jardiance 25 mg, Ozempic 1 mg weekly, Metformin 500 mg XR 4 tablets at night -Patient is compliant with the above medications and reports no side effects.  -Eye exam: UTD 1/23 -Foot exam: Due  -Microalbumin: UTD 12/23 -Statin: yes -PNA vaccine: discussed, is considering  -Denies symptoms of hypoglycemia, polyuria, polydipsia, numbness extremities, foot ulcers/trauma.   Renal Cell Carcinoma:  -First diagnosed 06/2018, right kidney removed 07/2018; had spread to bladder and lungs but had been in remission for 1 year. -Following with Oncology Dr. Tasia Catchings, note reviewed from 07/06/21 -Last CT from 07/03/22 clear, planning repeat scan in August   GERD/Barrett's Esophagus: -Currently on Prilosec 20 mg PRN  Health Maintenance: -Colon cancer screening: colonoscopy 10/2021, repeat in 7 years -Blood work  UTD   Outpatient Encounter Medications as of 07/28/2022  Medication Sig   ALPRAZolam (XANAX) 0.5 MG tablet Take 1 tablet (0.5 mg total) by mouth at bedtime as needed.   aspirin 81 MG EC tablet Take 81 mg by mouth daily with supper.   clobetasol ointment (TEMOVATE) [id number] % Apply 1 application topically 2 (two) times daily as needed for rash.   empagliflozin (JARDIANCE) 25 MG TABS tablet Take 1 tablet (25 mg total) by mouth daily before breakfast.   Glucose Blood (ACCU-CHEK AVIVA PLUS VI) by In Vitro route.   metformin (FORTAMET) 1000 MG (OSM) 24 hr tablet Take 1 tablet (1,000 mg total) by mouth 2 (two) times daily with a meal.   Multiple Vitamin (MULTIVITAMIN) tablet Take 1 tablet by mouth daily with supper.   omeprazole (PRILOSEC) 20 MG capsule Take 1 capsule (20 mg total) by mouth daily as needed (acid reflux).   [DISCONTINUED] lisinopril (PRINIVIL,ZESTRIL) 10 MG tablet TAKE 1 TABLET(10 MG) BY MOUTH DAILY   [DISCONTINUED] metFORMIN (GLUCOPHAGE-XR) 500 MG 24 hr tablet Take 3 pills by mouth daily for 4-5 days, then increase to 4 pills daily (if tolerated) (Patient taking differently: Take 1,000 mg by mouth daily with supper.)   [DISCONTINUED] methylPREDNISolone (MEDROL DOSEPAK) 4 MG TBPK tablet Day 1: Take 8 mg (2 tablets) before breakfast, 4 mg (1 tablet) after lunch, 4 mg (1 tablet) after supper, and 8 mg (2 tablets) at bedtime. Day 2:Take 4 mg (1 tablet) before breakfast, 4 mg (  1 tablet) after lunch, 4 mg (1 tablet) after supper, and 8 mg (2 tablets) at bedtime. Day 3: Take 4 mg (1 tablet) before breakfast, 4 mg (1 tablet) after lunch, 4 mg (1 tablet) after supper, and 4 mg (1 tablet) at bedtime. Day 4: Take 4 mg (1 tablet) before breakfast, 4 mg (1 tablet) after lunch, and 4 mg (1 tablet) at bedtime. Day 5: Take 4 mg (1 tablet) before breakfast and 4 mg (1 tablet) at bedtime. Day 6: Take 4 mg (1 tablet) before breakfast.   [DISCONTINUED] OZEMPIC, 0.25 OR 0.5 MG/DOSE, 2 MG/1.5ML SOPN Inject 1 mg into  the skin every Sunday.   [DISCONTINUED] rosuvastatin (CRESTOR) 20 MG tablet Take 20 mg by mouth daily with supper.   lisinopril (ZESTRIL) 10 MG tablet Take 1 tablet (10 mg total) by mouth daily.   [START ON 07/30/2022] OZEMPIC, 0.25 OR 0.5 MG/DOSE, 2 MG/1.5ML SOPN Inject 1 mg into the skin every Sunday.   rosuvastatin (CRESTOR) 20 MG tablet Take 1 tablet (20 mg total) by mouth daily with supper.   [DISCONTINUED] benzonatate (TESSALON) 100 MG capsule Take 1 capsule (100 mg total) by mouth 2 (two) times daily as needed for cough. (Patient not taking: Reported on 07/06/2022)   [DISCONTINUED] diphenoxylate-atropine (LOMOTIL) 2.5-0.025 MG tablet Take 1 tablet by mouth 4 (four) times daily as needed for diarrhea or loose stools. (Patient not taking: Reported on 07/06/2022)   No facility-administered encounter medications on file as of 07/28/2022.    Past Medical History:  Diagnosis Date   Arthritis    hands, left knee   Cancer ([place])    Diabetes mellitus without complication ([place])    Eczema 01/08/2017   GERD (gastroesophageal reflux disease)    History of kidney cancer    Hyperlipidemia LDL goal <100 11/02/2014   Hypertension    Kidney stone    YEAR H/O HEMATURIA   Psoriasis    Wears dentures    full upper, partial lower.  Has, does not wear    Past Surgical History:  Procedure Laterality Date   COLONOSCOPY N/A 10/21/2021   Procedure: COLONOSCOPY;  Surgeon: [name], MD;  Location: [place];  Service: Endoscopy;  Laterality: N/A;   COLONOSCOPY WITH PROPOFOL N/A 02/23/2017   Procedure: COLONOSCOPY WITH PROPOFOL;  Surgeon: [name], MD;  Location: [place];  Service: Endoscopy;  Laterality: N/A;  Diabetic - oral meds   CYST EXCISION  Aug. 2016   on back   CYSTOSCOPY W/ RETROGRADES Right 06/28/2018   Procedure: CYSTOSCOPY WITH RETROGRADE PYELOGRAM;  Surgeon: Billey Co, MD;  Location: [facility];  Service: Urology;  Laterality: Right;   CYSTOSCOPY W/ URETERAL  STENT REMOVAL Right 07/22/2018   Procedure: CYSTOSCOPY WITH STENT REMOVAL;  Surgeon: Billey Co, MD;  Location: [facility];  Service: Urology;  Laterality: Right;   CYSTOSCOPY WITH BIOPSY Right 06/28/2018   Procedure: CYSTOSCOPY WITH RENAL BIOPSY;  Surgeon: Billey Co, MD;  Location: [facility];  Service: Urology;  Laterality: Right;   CYSTOSCOPY WITH URETEROSCOPY AND STENT PLACEMENT Right 06/28/2018   Procedure: CYSTOSCOPY WITH URETEROSCOPY AND STENT PLACEMENT;  Surgeon: Billey Co, MD;  Location: [facility];  Service: Urology;  Laterality: Right;   ESOPHAGOGASTRODUODENOSCOPY N/A 10/21/2021   Procedure: ESOPHAGOGASTRODUODENOSCOPY (EGD);  Surgeon: [name], MD;  Location: [place];  Service: Endoscopy;  Laterality: N/A;   FULGURATION OF BLADDER TUMOR N/A 06/28/2018   Procedure: CYSTOSCOPY BLADDER BIOPSY, FULGERATION OF BLADDER;  Surgeon: Billey Co,  MD;  Location: [facility];  Service: Urology;  Laterality: N/A;   KNEE SURGERY Left    LAPAROSCOPIC NEPHRECTOMY, HAND ASSISTED Right 07/22/2018   Procedure: HAND ASSISTED LAPAROSCOPIC NEPHRECTOMY;  Surgeon: Billey Co, MD;  Location: [facility];  Service: Urology;  Laterality: Right;   NASAL SINUS SURGERY  03/15   POLYPECTOMY N/A 02/23/2017   Procedure: POLYPECTOMY;  Surgeon: [name], MD;  Location: [place];  Service: Endoscopy;  Laterality: N/A;   POLYPECTOMY  10/21/2021   Procedure: POLYPECTOMY;  Surgeon: [name], MD;  Location: [place];  Service: Endoscopy;;   tooth abstraction     TRANSURETHRAL RESECTION OF BLADDER TUMOR N/A 05/12/2019   Procedure: TRANSURETHRAL RESECTION OF BLADDER TUMOR (TURBT);  Surgeon: Billey Co, MD;  Location: [facility];  Service: Urology;  Laterality: N/A;   VARICOCELE EXCISION     XI ROBOTIC ASSISTED VENTRAL HERNIA N/A 11/18/2020   Procedure: XI ROBOTIC ASSISTED VENTRAL HERNIA, incisional;  Surgeon: Jules Husbands, MD;  Location: [facility];  Service: General;   Laterality: N/A;    Family History  Problem Relation Age of Onset   Heart disease Father    Hypertension Father    COPD Father    Colon cancer Maternal Grandmother    Heart attack Paternal Grandfather     Social History   Socioeconomic History   Marital status: Married    Spouse name: Alma Friendly   Number of children: Not on file   Years of education: Not on file   Highest education level: Not on file  Occupational History   Not on file  Tobacco Use   Smoking status: Former    Types: Cigarettes    Quit date: 06/15/2013    Years since quitting: 9.1   Smokeless tobacco: Never  Vaping Use   Vaping Use: Never used  Substance and Sexual Activity   Alcohol use: No    Alcohol/week: 0.0 standard drinks of alcohol   Drug use: No   Sexual activity: Yes  Other Topics Concern   Not on file  Social History Narrative   Lives with wife, sister in law   Social Determinants of Health   Financial Resource Strain: Not on file  Food Insecurity: Not on file  Transportation Needs: Not on file  Physical Activity: Not on file  Stress: Not on file  Social Connections: Not on file  Intimate Partner Violence: Not on file    Review of Systems  Constitutional:  Negative for chills and fever.  Eyes:  Negative for blurred vision.  Respiratory:  Negative for shortness of breath.   Cardiovascular:  Negative for chest pain.  Gastrointestinal:  Negative for abdominal pain, nausea and vomiting.  Neurological:  Negative for headaches.        Objective    BP 128/78   Pulse (!) 106   Temp 97.9 F (36.6 C)   Resp 18   Ht 6' (1.829 m)   Wt 211 lb 8 oz (95.9 kg)   SpO2 96%   BMI 28.68 kg/m   Physical Exam Constitutional:      Appearance: Normal appearance.  HENT:     Head: Normocephalic and atraumatic.  Eyes:     Conjunctiva/sclera: Conjunctivae normal.  Cardiovascular:     Rate and Rhythm: Normal rate and regular rhythm.     Pulses:          Dorsalis pedis pulses are 2+ on the  right side and 2+ on the left side.  Pulmonary:  Effort: Pulmonary effort is normal.     Breath sounds: Normal breath sounds.  Musculoskeletal:     Right foot: Normal range of motion. No deformity, bunion, Charcot foot, foot drop or prominent metatarsal heads.     Left foot: Normal range of motion. No deformity, bunion, Charcot foot, foot drop or prominent metatarsal heads.  Feet:     Right foot:     Protective Sensation: 6 sites tested.  6 sites sensed.     Skin integrity: Skin integrity normal.     Toenail Condition: Right toenails are normal.     Left foot:     Protective Sensation: 6 sites tested.  6 sites sensed.     Skin integrity: Skin integrity normal.     Toenail Condition: Left toenails are normal.  Skin:    General: Skin is warm and dry.  Neurological:     General: No focal deficit present.     Mental Status: He is alert. Mental status is at baseline.  Psychiatric:        Mood and Affect: Mood normal.        Behavior: Behavior normal.         Assessment & Plan:   1. Type 2 diabetes mellitus with hyperglycemia, without long-term current use of insulin ([place]): A1c 7.2%. Foot exam today. Continue Jardiance 25 mg, Metformin switched to 1000 mg BID XR. Continue Ozempic 1 mg weekly, refilled. Follow up in 6 months.   - POCT HgB A1C - HM Diabetes Foot Exam - metformin (FORTAMET) 1000 MG (OSM) 24 hr tablet; Take 1 tablet (1,000 mg total) by mouth 2 (two) times daily with a meal.  Dispense: 180 tablet; Refill: 1 - OZEMPIC, 0.25 OR 0.5 MG/DOSE, 2 MG/1.5ML SOPN; Inject 1 mg into the skin every Sunday.  Dispense: 3 mL; Refill: 6  2. Benign essential HTN: Chronic and stable, continue Lisinopril 10 mg, refilled.  - lisinopril (ZESTRIL) 10 MG tablet; Take 1 tablet (10 mg total) by mouth daily.  Dispense: 90 tablet; Refill: 1  3. Hyperlipidemia LDL goal <100: Reviewed labs from December with the patient, cholesterol well controlled. Continue Crestor 20 mg.   - rosuvastatin  (CRESTOR) 20 MG tablet; Take 1 tablet (20 mg total) by mouth daily with supper.  Dispense: 90 tablet; Refill: 1  4. 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   5. Gastroesophageal reflux disease, unspecified whether esophagitis present: Controlled, taking Prilosec as needed.   Return in about 6 months (around 01/28/2023).   Teodora Medici, DO

## 2022-07-28 ENCOUNTER — Encounter: Payer: Self-pay | Admitting: Internal Medicine

## 2022-07-28 ENCOUNTER — Ambulatory Visit: Payer: 59 | Admitting: Internal Medicine

## 2022-07-28 VITALS — BP 128/78 | HR 106 | Temp 97.9°F | Resp 18 | Ht 72.0 in | Wt 211.5 lb

## 2022-07-28 DIAGNOSIS — E785 Hyperlipidemia, unspecified: Secondary | ICD-10-CM

## 2022-07-28 DIAGNOSIS — E1165 Type 2 diabetes mellitus with hyperglycemia: Secondary | ICD-10-CM

## 2022-07-28 DIAGNOSIS — C641 Malignant neoplasm of right kidney, except renal pelvis: Secondary | ICD-10-CM | POA: Diagnosis not present

## 2022-07-28 DIAGNOSIS — I10 Essential (primary) hypertension: Secondary | ICD-10-CM | POA: Diagnosis not present

## 2022-07-28 DIAGNOSIS — K219 Gastro-esophageal reflux disease without esophagitis: Secondary | ICD-10-CM

## 2022-07-28 LAB — POCT GLYCOSYLATED HEMOGLOBIN (HGB A1C): Hemoglobin A1C: 7.2 % — AB (ref 4.0–5.6)

## 2022-07-28 MED ORDER — METFORMIN HCL ER (OSM) 1000 MG PO TB24: 1000.0000 mg | ORAL_TABLET | Freq: Two times a day (BID) | ORAL | 1 refills | Status: DC

## 2022-07-28 MED ORDER — OZEMPIC (0.25 OR 0.5 MG/DOSE) 2 MG/1.5ML ~~LOC~~ SOPN: 1.0000 mg | PEN_INJECTOR | SUBCUTANEOUS | 6 refills | Status: DC

## 2022-07-28 MED ORDER — ROSUVASTATIN CALCIUM 20 MG PO TABS: 20.0000 mg | ORAL_TABLET | Freq: Every day | ORAL | 1 refills | Status: DC

## 2022-07-28 MED ORDER — LISINOPRIL 10 MG PO TABS: 10.0000 mg | ORAL_TABLET | Freq: Every day | ORAL | 1 refills | Status: DC

## 2022-07-30 ENCOUNTER — Other Ambulatory Visit: Payer: Self-pay | Admitting: Internal Medicine

## 2022-07-30 DIAGNOSIS — E1165 Type 2 diabetes mellitus with hyperglycemia: Secondary | ICD-10-CM

## 2022-07-31 NOTE — Telephone Encounter (Signed)
Requested medication (s) are due for refill today: yes  Requested medication (s) are on the active medication list: yes  Last refill:  07/28/22  Future visit scheduled: yes  Notes to clinic:  Pharmacy comment: Please consider alternative. Please respond with prescription changes or obtain Prior Auth. Call [phone].      Requested Prescriptions  Pending Prescriptions Disp Refills   metFORMIN (GLUCOPHAGE) 1000 MG tablet [Pharmacy Med Name: METFORMIN TAB 1000MG ]  3     Endocrinology:  Diabetes - Biguanides Failed - 07/30/2022  8:33 AM      Failed - B12 Level in normal range and within 720 days    No results found for: "VITAMINB12"       Passed - Cr in normal range and within 360 days    Creat  Date Value Ref Range Status  08/05/2018 1.04 0.70 - 1.33 mg/dL Final    Comment:    For patients >32 years of age, the reference limit for Creatinine is approximately 13% higher for people identified as African-American. .    Creatinine, Ser  Date Value Ref Range Status  07/06/2022 1.24 0.61 - 1.24 mg/dL Final   Creatinine, Urine  Date Value Ref Range Status  04/28/2022 223 20 - 320 mg/dL Final         Passed - HBA1C is between 0 and 7.9 and within 180 days    Hemoglobin A1C  Date Value Ref Range Status  07/28/2022 7.2 (A) 4.0 - 5.6 % Final  02/01/2013 10.8 (H) 4.2 - 6.3 % Final    Comment:    The American Diabetes Association recommends that a primary goal of therapy should be <7% and that physicians should reevaluate the treatment regimen in patients with HbA1c values consistently >8%.    Hgb A1c MFr Bld  Date Value Ref Range Status  04/28/2022 7.4 (H) <5.7 % of total Hgb Final    Comment:    For someone without known diabetes, a hemoglobin A1c value of 6.5% or greater indicates that they may have  diabetes and this should be confirmed with a follow-up  test. . For someone with known diabetes, a value <7% indicates  that their diabetes is well controlled and a  value  greater than or equal to 7% indicates suboptimal  control. A1c targets should be individualized based on  duration of diabetes, age, comorbid conditions, and  other considerations. . Currently, no consensus exists regarding use of hemoglobin A1c for diagnosis of diabetes for children. .          Passed - eGFR in normal range and within 360 days    GFR, Est African American  Date Value Ref Range Status  08/05/2018 95 > OR = 60 mL/min/1.75m2 Final   GFR calc Af Amer  Date Value Ref Range Status  01/26/2020 >60 >60 mL/min Final   GFR, Est Non African American  Date Value Ref Range Status  08/05/2018 82 > OR = 60 mL/min/1.71m2 Final   GFR, Estimated  Date Value Ref Range Status  07/06/2022 >60 >60 mL/min Final    Comment:    (NOTE) Calculated using the CKD-EPI Creatinine Equation (2021)          Passed - Valid encounter within last 6 months    Recent Outpatient Visits           3 days ago Type 2 diabetes mellitus with hyperglycemia, without long-term current use of insulin [facility])   [facility] Teodora Medici, [place]  3 months ago Benign essential HTN   [facility] Teodora Medici, DO   4 years ago Renal mass, right   [place], [place]   4 years ago Hematuria, unspecified type   [place], MD   4 years ago Type 2 diabetes mellitus with hyperglycemia, without long-term current use of insulin [facility])   [place], [name], MD       Future Appointments             In 6 months Teodora Medici, [facility], [place] within normal limits and completed in the last 12 months    WBC  Date Value Ref Range Status  07/06/2022 7.6 4.0 - 10.5 K/uL Final   RBC  Date Value Ref Range Status  07/06/2022 5.10 4.22 - 5.81 MIL/uL Final    Hemoglobin  Date Value Ref Range Status  07/06/2022 15.1 13.0 - 17.0 g/dL Final   HCT  Date Value Ref Range Status  07/06/2022 45.7 39.0 - 52.0 % Final   MCHC  Date Value Ref Range Status  07/06/2022 33.0 30.0 - 36.0 g/dL Final   [facility]  Date Value Ref Range Status  07/06/2022 29.6 26.0 - 34.0 pg Final   MCV  Date Value Ref Range Status  07/06/2022 89.6 80.0 - 100.0 fL Final   No results found for: "PLTCOUNTKUC", "LABPLAT", "POCPLA" RDW  Date Value Ref Range Status  07/06/2022 12.9 11.5 - 15.5 % Final

## 2022-08-01 NOTE — Telephone Encounter (Signed)
Prior auth started

## 2022-08-02 ENCOUNTER — Other Ambulatory Visit: Payer: Self-pay | Admitting: Internal Medicine

## 2022-08-02 ENCOUNTER — Telehealth: Payer: Self-pay | Admitting: *Deleted

## 2022-08-02 DIAGNOSIS — E1165 Type 2 diabetes mellitus with hyperglycemia: Secondary | ICD-10-CM

## 2022-08-02 NOTE — Telephone Encounter (Signed)
Received disability form from [employer] for this patient. Form completed and sent for doctor signature

## 2022-08-03 ENCOUNTER — Encounter: Payer: Self-pay | Admitting: Oncology

## 2022-08-03 NOTE — Telephone Encounter (Signed)
FMLA completed and faxed to [employer]. Patient informed.

## 2022-08-03 NOTE — Telephone Encounter (Signed)
Requested medication (s) are due for refill today: yes  Requested medication (s) are on the active medication list: yes  Last refill:  07/28/22  Future visit scheduled: yes  Notes to clinic:  Pharmacy comment: The prescription is written for Ozempic 0.25mg /0.5mg  and the prescribed dose is 1mg . Ozempic 0.25mg /0.5mg  can only deliver either 0.25mg  or 0.5mg  per injection. Available as 1mg  pen if 1mg  was intended dose. Please verify intended dose and adjust accordingly.      Requested Prescriptions  Pending Prescriptions Disp Refills   OZEMPIC, 0.25 OR 0.5 MG/DOSE, 2 MG/1.5ML SOPN [Pharmacy Med Name: OZEMPIC, 0.25 OR 0.5 MG/DOSE, 2 MG/] 3 mL 6    Sig: Inject 1 mg into the skin every Sunday.     Endocrinology:  Diabetes - GLP-1 Receptor Agonists - semaglutide Failed - 08/02/2022 10:31 AM      Failed - HBA1C in normal range and within 180 days    Hemoglobin A1C  Date Value Ref Range Status  07/28/2022 7.2 (A) 4.0 - 5.6 % Final  02/01/2013 10.8 (H) 4.2 - 6.3 % Final    Comment:    The American Diabetes Association recommends that a primary goal of therapy should be <7% and that physicians should reevaluate the treatment regimen in patients with HbA1c values consistently >8%.    Hgb A1c MFr Bld  Date Value Ref Range Status  04/28/2022 7.4 (H) <5.7 % of total Hgb Final    Comment:    For someone without known diabetes, a hemoglobin A1c value of 6.5% or greater indicates that they may have  diabetes and this should be confirmed with a follow-up  test. . For someone with known diabetes, a value <7% indicates  that their diabetes is well controlled and a value  greater than or equal to 7% indicates suboptimal  control. A1c targets should be individualized based on  duration of diabetes, age, comorbid conditions, and  other considerations. . Currently, no consensus exists regarding use of hemoglobin A1c for diagnosis of diabetes for children. .          Passed - Cr in normal range  and within 360 days    Creat  Date Value Ref Range Status  08/05/2018 1.04 0.70 - 1.33 mg/dL Final    Comment:    For patients >64 years of age, the reference limit for Creatinine is approximately 13% higher for people identified as African-American. .    Creatinine, Ser  Date Value Ref Range Status  07/06/2022 1.24 0.61 - 1.24 mg/dL Final   Creatinine, Urine  Date Value Ref Range Status  04/28/2022 223 20 - 320 mg/dL Final         Passed - Valid encounter within last 6 months    Recent Outpatient Visits           6 days ago Type 2 diabetes mellitus with hyperglycemia, without long-term current use of insulin [facility])   [facility] Teodora Medici, DO   3 months ago Benign essential HTN   [place], DO   4 years ago Renal mass, right   [facility] [name], [name], MD   4 years ago Hematuria, unspecified type   [facility] [name], [name], MD   4 years ago Type 2 diabetes mellitus with hyperglycemia, without long-term current use of insulin [facility])   [facility] [name], [name], MD  Future Appointments             In 5 months Teodora Medici, [facility], [facility]

## 2022-08-04 ENCOUNTER — Other Ambulatory Visit: Payer: Self-pay

## 2022-08-04 ENCOUNTER — Telehealth: Payer: Self-pay | Admitting: Internal Medicine

## 2022-08-04 ENCOUNTER — Other Ambulatory Visit: Payer: Self-pay | Admitting: Nurse Practitioner

## 2022-08-04 ENCOUNTER — Ambulatory Visit: Payer: Self-pay

## 2022-08-04 DIAGNOSIS — E1165 Type 2 diabetes mellitus with hyperglycemia: Secondary | ICD-10-CM

## 2022-08-04 MED ORDER — SEMAGLUTIDE (1 MG/DOSE) 4 MG/3ML ~~LOC~~ SOPN: 1.0000 mg | PEN_INJECTOR | SUBCUTANEOUS | 2 refills | Status: DC

## 2022-08-04 NOTE — Telephone Encounter (Signed)
Rx updateded

## 2022-08-04 NOTE — Telephone Encounter (Signed)
Copied from [place] [id number]. Topic: General - Other >> Aug 04, 2022 12:52 PM Everette C wrote: Reason for CRM: Hinton Dyer with [facility] has called for clarity regarding directions for the patient's prescription of Semaglutide, 1 MG/DOSE, 4 MG/3ML SOPN [id number]  Please contact Hinton Dyer further when possible   Reference # [id number]

## 2022-08-04 NOTE — Telephone Encounter (Signed)
  Chief Complaint: Pharmacy has question regarding rx Symptoms:  Frequency:  Pertinent Negatives: Patient denies  Disposition: [] ED /[] Urgent Care (no appt availability in office) / [] Appointment(In office/virtual)/ []  [facility]/ [] Home Care/ [] Refused Recommended Disposition /[] [facility]/ [x]  Follow-up with PCP Additional Notes: Please return call to pharmacy regarding rx.    Summary: Pharmacy seeking Rx clarification   Kim from [place] stated she needs clarification for the medication OZEMPIC, 0.25 OR 0.5 MG/DOSE, 2 MG/1.5ML SOPN. Stated directions are different from the strength of the pen.  Pharmacy seeking clarification  REF [id number]  Callback [phone]     Reason for Disposition  [1] Pharmacy calling with prescription question AND [2] triager unable to answer question  Answer Assessment - Initial Assessment Questions 1. NAME of MEDICINE: "What medicine(s) are you calling about?"     Ozempic 2. QUESTION: "What is your question?" (e.g., double dose of medicine, side effect)     Pharmacy has question regarding Rx 3. PRESCRIBER: "Who prescribed the medicine?" Reason: if prescribed by specialist, call should be referred to that group.      4. SYMPTOMS: "Do you have any symptoms?" If Yes, ask: "What symptoms are you having?"  "How bad are the symptoms (e.g., mild, moderate, severe)  Protocols used: Medication Question Call-A-AH

## 2022-08-15 ENCOUNTER — Other Ambulatory Visit: Payer: Self-pay

## 2022-08-17 ENCOUNTER — Other Ambulatory Visit: Payer: Self-pay

## 2022-08-17 ENCOUNTER — Telehealth: Payer: Self-pay | Admitting: Internal Medicine

## 2022-08-17 ENCOUNTER — Telehealth: Payer: Self-pay

## 2022-08-17 DIAGNOSIS — I10 Essential (primary) hypertension: Secondary | ICD-10-CM

## 2022-08-17 DIAGNOSIS — E1165 Type 2 diabetes mellitus with hyperglycemia: Secondary | ICD-10-CM

## 2022-08-17 MED ORDER — LISINOPRIL 10 MG PO TABS: 10.0000 mg | ORAL_TABLET | Freq: Every day | ORAL | 1 refills | Status: DC

## 2022-08-17 MED ORDER — METFORMIN HCL ER (OSM) 1000 MG PO TB24: 1000.0000 mg | ORAL_TABLET | Freq: Two times a day (BID) | ORAL | 1 refills | Status: DC

## 2022-08-17 NOTE — Telephone Encounter (Signed)
Pt told us to fill out long term disability for what we the PCP treats for

## 2022-08-17 NOTE — Telephone Encounter (Signed)
Copied from [place] [id number]. Topic: General - Other >> Aug 17, 2022 12:38 PM Matthew Woodard wrote: Reason for CRM: The patient has returned a missed call from J. Graves  Please contact the patient further when possible

## 2022-08-17 NOTE — Telephone Encounter (Signed)
Called left detailed vm we got long term disability paper work. Not sure what it is regarding? Cancer if so needs to be filled out by cancer provider.

## 2022-08-22 NOTE — Telephone Encounter (Signed)
Reference ID # [id number]  Matthew Woodard is calling in regards to disability forms that were faxed over on August 18, 2022  Please advise   She will be re-faxing documents again

## 2022-08-22 NOTE — Telephone Encounter (Signed)
Called they told me to refax to [phone]

## 2022-08-24 ENCOUNTER — Other Ambulatory Visit: Payer: Self-pay

## 2022-08-24 DIAGNOSIS — E1165 Type 2 diabetes mellitus with hyperglycemia: Secondary | ICD-10-CM

## 2022-08-24 MED ORDER — EMPAGLIFLOZIN 25 MG PO TABS: 25.0000 mg | ORAL_TABLET | Freq: Every day | ORAL | 1 refills | Status: DC

## 2022-08-24 NOTE — Telephone Encounter (Signed)
New pharmacy

## 2022-09-06 ENCOUNTER — Ambulatory Visit: Payer: BC Managed Care – PPO | Admitting: Family Medicine

## 2022-09-18 ENCOUNTER — Other Ambulatory Visit: Payer: Self-pay | Admitting: Internal Medicine

## 2022-09-18 MED ORDER — CLOBETASOL PROPIONATE 0.05 % EX OINT: 1.0000 | TOPICAL_OINTMENT | Freq: Two times a day (BID) | CUTANEOUS | 0 refills | Status: DC | PRN

## 2022-10-10 ENCOUNTER — Encounter: Payer: Self-pay | Admitting: Internal Medicine

## 2022-10-10 MED ORDER — GLUCOSE BLOOD VI STRP: ORAL_STRIP | 12 refills | Status: DC

## 2022-10-24 ENCOUNTER — Telehealth: Payer: Self-pay

## 2022-10-24 NOTE — Telephone Encounter (Signed)
Dr Helene Kelp from [employer] left messaging stating "I see where you called but didn't leave a message, I'll be here waiting"  I don't see a telephone encounter and he didn't say what it was about. His Call back # is [phone]  I attached Steward Drone in case this is regarding paperwork she may be filling out.

## 2022-12-13 ENCOUNTER — Encounter: Payer: Self-pay | Admitting: Internal Medicine

## 2022-12-14 ENCOUNTER — Other Ambulatory Visit: Payer: Self-pay | Admitting: Internal Medicine

## 2022-12-14 DIAGNOSIS — E1165 Type 2 diabetes mellitus with hyperglycemia: Secondary | ICD-10-CM

## 2022-12-14 MED ORDER — METFORMIN HCL ER 500 MG PO TB24: 1000.0000 mg | ORAL_TABLET | Freq: Two times a day (BID) | ORAL | 3 refills | Status: DC

## 2022-12-14 MED ORDER — SEMAGLUTIDE (1 MG/DOSE) 4 MG/3ML ~~LOC~~ SOPN: 1.0000 mg | PEN_INJECTOR | SUBCUTANEOUS | 2 refills | Status: DC

## 2023-01-04 ENCOUNTER — Ambulatory Visit
Admission: RE | Admit: 2023-01-04 | Discharge: 2023-01-04 | Disposition: A | Discharge status: Home / Self Care | Payer: 59 | Source: Ambulatory Visit | Attending: Oncology | Admitting: Oncology

## 2023-01-04 DIAGNOSIS — C641 Malignant neoplasm of right kidney, except renal pelvis: Secondary | ICD-10-CM | POA: Diagnosis present

## 2023-01-11 ENCOUNTER — Inpatient Hospital Stay: Payer: 59 | Admitting: Oncology

## 2023-01-11 ENCOUNTER — Encounter: Payer: Self-pay | Admitting: Oncology

## 2023-01-11 ENCOUNTER — Inpatient Hospital Stay: Payer: 59 | Attending: Oncology

## 2023-01-11 VITALS — BP 137/80 | HR 89 | Temp 96.5°F | Resp 18 | Wt 211.5 lb

## 2023-01-11 DIAGNOSIS — C7911 Secondary malignant neoplasm of bladder: Secondary | ICD-10-CM | POA: Diagnosis not present

## 2023-01-11 DIAGNOSIS — R918 Other nonspecific abnormal finding of lung field: Secondary | ICD-10-CM | POA: Diagnosis not present

## 2023-01-11 DIAGNOSIS — C7801 Secondary malignant neoplasm of right lung: Secondary | ICD-10-CM | POA: Insufficient documentation

## 2023-01-11 DIAGNOSIS — C641 Malignant neoplasm of right kidney, except renal pelvis: Secondary | ICD-10-CM

## 2023-01-11 DIAGNOSIS — L409 Psoriasis, unspecified: Secondary | ICD-10-CM | POA: Diagnosis not present

## 2023-01-11 DIAGNOSIS — C7802 Secondary malignant neoplasm of left lung: Secondary | ICD-10-CM | POA: Insufficient documentation

## 2023-01-11 LAB — CBC WITH DIFFERENTIAL (CANCER CENTER ONLY)
Abs Immature Granulocytes: 0.04 10*3/uL (ref 0.00–0.07)
Basophils Absolute: 0 10*3/uL (ref 0.0–0.1)
Basophils Relative: 0 %
Eosinophils Absolute: 0.1 10*3/uL (ref 0.0–0.5)
Eosinophils Relative: 2 %
HCT: 42.5 % (ref 39.0–52.0)
Hemoglobin: 14.2 g/dL (ref 13.0–17.0)
Immature Granulocytes: 1 %
Lymphocytes Relative: 27 %
Lymphs Abs: 2 10*3/uL (ref 0.7–4.0)
MCH: 30.1 pg (ref 26.0–34.0)
MCHC: 33.4 g/dL (ref 30.0–36.0)
MCV: 90 fL (ref 80.0–100.0)
Monocytes Absolute: 0.5 10*3/uL (ref 0.1–1.0)
Monocytes Relative: 7 %
Neutro Abs: 4.7 10*3/uL (ref 1.7–7.7)
Neutrophils Relative %: 63 %
Platelet Count: 217 10*3/uL (ref 150–400)
RBC: 4.72 MIL/uL (ref 4.22–5.81)
RDW: 12.4 % (ref 11.5–15.5)
WBC Count: 7.4 10*3/uL (ref 4.0–10.5)
nRBC: 0 % (ref 0.0–0.2)

## 2023-01-11 LAB — CMP (CANCER CENTER ONLY)
ALT: 15 U/L (ref 0–44)
AST: 18 U/L (ref 15–41)
Albumin: 4.3 g/dL (ref 3.5–5.0)
Alkaline Phosphatase: 67 U/L (ref 38–126)
Anion gap: 9 (ref 5–15)
BUN: 22 mg/dL — ABNORMAL HIGH (ref 6–20)
CO2: 25 mmol/L (ref 22–32)
Calcium: 9.4 mg/dL (ref 8.9–10.3)
Chloride: 102 mmol/L (ref 98–111)
Creatinine: 1.25 mg/dL — ABNORMAL HIGH (ref 0.61–1.24)
GFR, Estimated: >60 mL/min (ref >60)
Glucose, Bld: 162 mg/dL — ABNORMAL HIGH (ref 70–99)
Potassium: 4.2 mmol/L (ref 3.5–5.1)
Sodium: 136 mmol/L (ref 135–145)
Total Bilirubin: 0.4 mg/dL (ref 0.3–1.2)
Total Protein: 7.1 g/dL (ref 6.5–8.1)

## 2023-01-11 LAB — LACTATE DEHYDROGENASE: LDH: 105 U/L (ref 98–192)

## 2023-01-11 NOTE — Assessment & Plan Note (Signed)
Follow up with dermatology.  On topical steroid cream PRN 

## 2023-01-11 NOTE — Progress Notes (Signed)
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 Currently off immunotherapy Surveillance CT scan was reviewed with patient and wife.  Right lung nodularity, favor reactive/inflammation changes.  I will repeat a CT chest without contrast in 3 months. Recommend continue surveillance imaging. Repeat CT chest abdomen pelvis in 6 months.   Psoriasis Follow up with dermatology.  On topical steroid cream PRN   Orders Placed This Encounter  Procedures   CT Chest Wo Contrast    Standing Status:   Future    Standing Expiration Date:   04/13/2023    Order Specific Question:   Preferred imaging location?    Answer:   [facility]   CT CHEST ABDOMEN PELVIS WO CONTRAST    Standing Status:   Future    Standing Expiration Date:   01/11/2024    Order Specific Question:   Preferred imaging location?    Answer:   [facility]    Order Specific Question:   If indicated for the ordered procedure, I authorize the administration of oral contrast media per Radiology protocol    Answer:   Yes    Order Specific Question:   Does the patient have a contrast media/X-ray dye allergy?    Answer:   No   CMP (Cancer Center only)    Standing Status:   Future    Standing Expiration Date:   01/11/2024   CBC with Differential (Cancer Center Only)    Standing Status:   Future    Standing Expiration Date:   01/11/2024   Lactate dehydrogenase    Standing Status:   Future    Standing Expiration Date:   01/11/2024   Follow up in 6 month All questions were answered. The patient knows to call the clinic with any problems, questions or concerns.  Matthew Patience, MD, PhD [facility] Hematology Oncology 01/11/2023     HISTORY OF PRESENTING ILLNESS:   Matthew Woodard is a   57 y.o.  male presents for follow up for metastatic kidney cancer Oncology History  Metastatic renal cell carcinoma (HCC)  06/2018 Imaging   CT urogram done which showed right 4 cm central enhancing renal mass with invasion into the collecting system. X-ray of chest was performed to see complete staging which showed no concerning findings   06/28/2018 Cancer Diagnosis      06/28/2018 Procedure    patient underwent a cystoscopy, bladder biopsy and a right retrograde pyelogram with intraoperative interpretation, right diagnostic ureteroscopy, right renal pelvis biopsy, right ureteral stent placement.  biopsy showed atypical cell clusters, with extensive crush artifact, nondiagnostic.     07/22/2018 Initial Diagnosis   Patient underwent right radical laparoscopic nephrectomy with biopsy showing 5.3 cm RCC, clear cell type, grade 3, tumor invades renal vein and segmental branches, pelvic calyceal system and perirenal sinus/fat.  Surgical margins negative for tumor.pT3a,Nx Post-operation, patient was on surveillance after surgery.   10/28/2018 Imaging   CT abdomen pelvis with contrast showed minimal fluid and or postoperative changes within the right renal fossa.  No evidence of metastatic disease or recurrence.  Patient has left kidney lesion previously characterized as nonenhancing cyst by multi phasic contrast-enhanced CT.   04/30/2019 Progression   Patient reports an episode of gross hematuria in early December 2020, patient is due for 10-month surveillance CT scan.  04/30/2019 CT abdomen pelvis with contrast showed high attenuation mass at the right uretero vesicle junction, with extension into the bladder.  Bilateral pulmonary nodules, most indicated for metastatic disease.  Patient underwent cystoscopy and transurethral resection of irregular bladder tumor 3 cm.  Mass appeared to emanate from the right ureteral orifice.  Pathology showed clear cell renal cell carcinoma involving urothelial  mucosa.  #NGS: [employer] PD L1 TPS 1%   05/28/2019 Imaging   CT chest w contrast 1. Numerous bilateral pulmonary nodules measuring up to 10 mm diameter. Imaging features compatible with metastatic disease. 2. Left hilar lymphadenopathy, also highly suspicious for metastatic involvement.    05/28/2019 Imaging   Bone scan showed  No scintigraphic evidence of osseous metastatic disease.    06/05/2019 - 06/21/2021 Chemotherapy   06/05/19 Axitinib 5mg  BID+ Keytruda  09/2019 off axitinib and Keytruda since the beginning of May due to transaminitis. GI work up negative.  Ultrasound liver vascular Doppler is negative #11/24/2019, resumed on Keytruda every 3 weeks #01/05/2020, axitinib was resumed 3 mg #01/20/2020, axitinib was discontinued due to recurrence of transaminitis.  [name] was temporarily held. #03/16/2020-06/21/2021 resumed on Keytruda every 3 weeks [name] was discontinued due to recurrent immunotherapy induced diarrhea    07/25/2019 Imaging   Brain w wo contrast No mass, hemorrhage, or abnormal enhancement.   09/29/2020 Imaging   CT scan showed no evidence of local recurrence or new/progressive disease in the chest abdomen or pelvis.  Tiny nonspecific lung nodules are unchanged.  No new lung nodules   01/14/2021 Imaging   CT chest abdomen pelvis showed stable exam, no new or progressive metastatic disease within chest abdomen pelvis. Stable lung nodules. Aortic atherosclerosis.    04/15/2021 Imaging   CT chest abdomen pelvis with contrast showed stable examination.  No evidence of recurrent or metastatic carcinoma within the chest abdomen pelvis.     08/02/2021 Imaging   CT chest abdomen pelvis without contrast, stable examination without evidence of recurrence or metastatic disease.  Mild asymmetric diffuse esophageal wall thickening, similar to prior examination.  Likely chronic esophagitis.  Aortic atherosclerosis   10/21/2021 Procedure   colonoscopy showed 3 mm polyp which  was resected and retrieved.  Negative for dysplasia/malignancy.  Random biopsy showed lymphocytic colitis. EGD showed long segment Barrett's esophagus.  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  Negative for dysplasia and malignancy.  Small bowel cold biopsy showed reactive duodenitis.  Negative for CMV.  Negative for dysplasia or malignancy.   11/16/2021 Imaging   CT chest abdomen pelvis without contrast showed stable examination without evidence of local recurrence or active malignant disease in the chest abdomen pelvis.  Reflux retained contrast in the esophagus suggestive of esophagitis.  Aortic atherosclerosis   07/03/2022 Imaging   CT chest abdomen pelvis w contrast   1. Stable examination status post right nephrectomy without evidence of new or progressive disease in the chest, abdomen or pelvis. 2. Stable scattered tiny pulmonary nodules favored benign etiology. 3.  Aortic Atherosclerosis    Clear cell carcinoma of right kidney (HCC)     #History of cutaneous psoriasis, mostly on his right hand,    INTERVAL HISTORY Matthew Woodard is a 57 y.o. male who has above history reviewed by me today presents for follow up visit for management of metastatic RCC. Patient reports doing well. No diarrhea. No new complaints. Accompanied by his wife.  + Chronic cough, especially in the morning. Patient is on Ozempic for diabetes treatment.  Has lost weight   Review of Systems  Constitutional:  Negative for appetite change, chills, fatigue, fever and unexpected weight change.  HENT:   Negative for hearing loss and voice change.   Eyes:  Negative for eye problems and icterus.  Respiratory:  Positive for cough. Negative for chest tightness and shortness of breath.   Cardiovascular:  Negative for chest pain and leg swelling.  Gastrointestinal:  Negative for abdominal distention, abdominal pain, blood in stool, diarrhea and nausea.  Endocrine: Negative  for hot flashes.  Genitourinary:  Negative for difficulty urinating, dysuria and frequency.   Musculoskeletal:        Bilateral knee replacement.   Skin:  Negative for itching and rash.  Neurological:  Negative for extremity weakness, headaches, light-headedness and numbness.  Hematological:  Negative for adenopathy. Does not bruise/bleed easily.  Psychiatric/Behavioral:  Negative for confusion.     MEDICAL HISTORY:  Past Medical History:  Diagnosis Date   Arthritis    hands, left knee   Cancer (HCC)    Diabetes mellitus without complication (HCC)    Eczema 01/08/2017   GERD (gastroesophageal reflux disease)    History of kidney cancer    Hyperlipidemia LDL goal <100 11/02/2014   Hypertension    Kidney stone    YEAR H/O HEMATURIA   Psoriasis    Wears dentures    full upper, partial lower.  Has, does not wear    SURGICAL HISTORY: Past Surgical History:  Procedure Laterality Date   COLONOSCOPY N/A 10/21/2021   Procedure: COLONOSCOPY;  Surgeon: Midge Minium, MD;  Location: [facility];  Service: Endoscopy;  Laterality: N/A;   COLONOSCOPY WITH PROPOFOL N/A 02/23/2017   Procedure: COLONOSCOPY WITH PROPOFOL;  Surgeon: Midge Minium, MD;  Location: [facility];  Service: Endoscopy;  Laterality: N/A;  Diabetic - oral meds   CYST EXCISION  Aug. 2016   on back   CYSTOSCOPY W/ RETROGRADES Right 06/28/2018   Procedure: CYSTOSCOPY WITH RETROGRADE PYELOGRAM;  Surgeon: Sondra Come, MD;  Location: [facility];  Service: Urology;  Laterality: Right;   CYSTOSCOPY W/ URETERAL STENT REMOVAL Right 07/22/2018   Procedure: CYSTOSCOPY WITH STENT REMOVAL;  Surgeon: Sondra Come, MD;  Location: [facility];  Service: Urology;  Laterality: Right;   CYSTOSCOPY WITH BIOPSY Right 06/28/2018   Procedure: CYSTOSCOPY WITH RENAL BIOPSY;  Surgeon: Sondra Come, MD;  Location: [facility];  Service: Urology;  Laterality: Right;   CYSTOSCOPY WITH URETEROSCOPY AND STENT PLACEMENT Right 06/28/2018    Procedure: CYSTOSCOPY WITH URETEROSCOPY AND STENT PLACEMENT;  Surgeon: Sondra Come, MD;  Location: [facility];  Service: Urology;  Laterality: Right;   ESOPHAGOGASTRODUODENOSCOPY N/A 10/21/2021   Procedure: ESOPHAGOGASTRODUODENOSCOPY (EGD);  Surgeon: Midge Minium, MD;  Location: [facility];  Service: Endoscopy;  Laterality: N/A;   FULGURATION OF BLADDER TUMOR N/A 06/28/2018   Procedure: CYSTOSCOPY BLADDER BIOPSY, FULGERATION OF BLADDER;  Surgeon: Sondra Come, MD;  Location: [facility];  Service: Urology;  Laterality: N/A;   KNEE SURGERY Left    LAPAROSCOPIC NEPHRECTOMY, HAND ASSISTED Right 07/22/2018   Procedure: HAND ASSISTED LAPAROSCOPIC NEPHRECTOMY;  Surgeon: Sondra Come, MD;  Location: [facility];  Service: Urology;  Laterality: Right;   NASAL SINUS SURGERY  03/15   POLYPECTOMY N/A 02/23/2017   Procedure: POLYPECTOMY;  Surgeon: Midge Minium, MD;  Location: [facility];  Service: Endoscopy;  Laterality: N/A;   POLYPECTOMY  10/21/2021   Procedure: POLYPECTOMY;  Surgeon: Midge Minium, MD;  Location: [facility];  Service: Endoscopy;;   tooth abstraction     TRANSURETHRAL RESECTION OF BLADDER TUMOR N/A 05/12/2019   Procedure: TRANSURETHRAL RESECTION OF BLADDER TUMOR (TURBT);  Surgeon: Sondra Come, MD;  Location: [facility];  Service: Urology;  Laterality: N/A;   VARICOCELE EXCISION     XI ROBOTIC ASSISTED VENTRAL HERNIA N/A 11/18/2020   Procedure: XI ROBOTIC ASSISTED VENTRAL HERNIA, incisional;  Surgeon: Leafy Ro, MD;  Location: [facility];  Service: General;  Laterality: N/A;    SOCIAL HISTORY: Social History   Socioeconomic History   Marital status: Married    Spouse name: Isabelle Course   Number of children: Not on file   Years of education: Not on file   Highest education level: Not on file  Occupational History   Not on file  Tobacco Use   Smoking status: Former    Current packs/day: 0.00    Types: Cigarettes    Quit date: 06/15/2013    Years since  quitting: 9.5   Smokeless tobacco: Never  Vaping Use   Vaping status: Never Used  Substance and Sexual Activity   Alcohol use: No    Alcohol/week: 0.0 standard drinks of alcohol   Drug use: No   Sexual activity: Yes  Other Topics Concern   Not on file  Social History Narrative   Lives with wife, sister in law   Social Determinants of Health   Financial Resource Strain: Low Risk  (09/13/2021)   Received from [facility], [facility]   Overall Financial Resource Strain (CARDIA)    Difficulty of Paying Living Expenses: Not hard at all  Food Insecurity: No Food Insecurity (09/13/2021)   Received from [facility], [facility]   Hunger Vital Sign    Worried About Running Out of Food in the Last Year: Never true    Ran Out of Food in the Last Year: Never true  Transportation Needs: No Transportation Needs (09/13/2021)   Received from [facility], [facility]   [facility] - Transportation    Lack of Transportation (Medical): No    Lack of Transportation (Non-Medical): No  Physical Activity: Not on file  Stress: Not on file  Social Connections: Not on file  Intimate Partner Violence: Not on file    FAMILY HISTORY: Family History  Problem Relation Age of Onset   Heart disease Father    Hypertension Father    COPD Father    Colon cancer Maternal Grandmother    Heart attack Paternal Grandfather     ALLERGIES:  is allergic to axitinib, propofol, and glucotrol [glipizide].  MEDICATIONS:  Current Outpatient Medications  Medication Sig Dispense Refill   ALPRAZolam (XANAX) 0.5 MG tablet Take 1 tablet (0.5 mg total) by mouth at bedtime as needed. 60 tablet 0   aspirin 81 MG EC tablet Take 81 mg by mouth daily with supper.     clobetasol ointment (TEMOVATE) 0.05 % Apply 1 Application topically 2 (two) times daily as needed. 30 g 0   empagliflozin (JARDIANCE) 25 MG TABS tablet Take 1 tablet (25 mg total) by mouth daily before breakfast. 90 tablet 1   Glucose Blood  (ACCU-CHEK AVIVA PLUS VI) by In Vitro route.     glucose blood test strip Check tid, one touch verio 100 each 12   lisinopril (ZESTRIL) 10 MG tablet Take 1 tablet (10 mg total) by mouth daily. 90 tablet 1   metFORMIN (GLUCOPHAGE-XR) 500 MG 24 hr tablet Take 2 tablets (1,000 mg total)  by mouth 2 (two) times daily with a meal. 180 tablet 3   Multiple Vitamin (MULTIVITAMIN) tablet Take 1 tablet by mouth daily with supper.     omeprazole (PRILOSEC) 20 MG capsule Take 1 capsule (20 mg total) by mouth daily as needed (acid reflux). 30 capsule 1   rosuvastatin (CRESTOR) 20 MG tablet Take 1 tablet (20 mg total) by mouth daily with supper. 90 tablet 1   Semaglutide, 1 MG/DOSE, 4 MG/3ML SOPN Inject 1 mg as directed once a week. 3 mL 2   No current facility-administered medications for this visit.     PHYSICAL EXAMINATION: ECOG PERFORMANCE STATUS: 1 - Symptomatic but completely ambulatory Vitals:   01/11/23 1427  BP: 137/80  Pulse: 89  Resp: 18  Temp: (!) 96.5 F (35.8 C)  SpO2: 97%   Filed Weights   01/11/23 1427  Weight: 211 lb 8 oz (95.9 kg)    Physical Exam Constitutional:      General: He is not in acute distress. HENT:     Head: Normocephalic and atraumatic.  Eyes:     General: No scleral icterus.    Pupils: Pupils are equal, round, and reactive to light.  Cardiovascular:     Rate and Rhythm: Normal rate and regular rhythm.     Heart sounds: Normal heart sounds.  Pulmonary:     Effort: Pulmonary effort is normal. No respiratory distress.     Breath sounds: No wheezing.  Abdominal:     General: Bowel sounds are normal. There is no distension.     Palpations: Abdomen is soft. There is no mass.     Tenderness: There is no abdominal tenderness.  Musculoskeletal:        General: No deformity. Normal range of motion.     Cervical back: Normal range of motion and neck supple.     Comments: Bilateral knee replacement.   Skin:    General: Skin is warm and dry.     Findings: No  erythema or rash.  Neurological:     Mental Status: He is alert and oriented to person, place, and time. Mental status is at baseline.     Cranial Nerves: No cranial nerve deficit.     Coordination: Coordination normal.  Psychiatric:        Mood and Affect: Mood normal.     LABORATORY DATA:  I have reviewed the data as listed    Latest Ref Rng & Units 01/11/2023    2:04 PM 07/06/2022   10:29 AM 03/01/2022    2:12 PM  CBC  WBC 4.0 - 10.5 K/uL 7.4  7.6  7.9   Hemoglobin 13.0 - 17.0 g/dL 57.8  46.9  62.9   Hematocrit 39.0 - 52.0 % 42.5  45.7  44.6   Platelets 150 - 400 K/uL 217  235  230       Latest Ref Rng & Units 01/11/2023    2:04 PM 07/06/2022   10:29 AM 07/03/2022   10:16 AM  CMP  Glucose 70 - 99 mg/dL 528  413    BUN 6 - 20 mg/dL 22  19    Creatinine 2.44 - 1.24 mg/dL 0.10  2.72  5.36   Sodium 135 - 145 mmol/L 136  136    Potassium 3.5 - 5.1 mmol/L 4.2  4.5    Chloride 98 - 111 mmol/L 102  101    CO2 22 - 32 mmol/L 25  26    Calcium 8.9 - 10.3 mg/dL  9.4  9.4    Total Protein 6.5 - 8.1 g/dL 7.1  7.5    Total Bilirubin 0.3 - 1.2 mg/dL 0.4  0.7    Alkaline Phos 38 - 126 U/L 67  74    AST 15 - 41 U/L 18  20    ALT 0 - 44 U/L 15  14      RADIOGRAPHIC STUDIES: I have personally reviewed the radiological images as listed and agreed with the findings in the report.  CT CHEST ABDOMEN PELVIS WO CONTRAST  Result Date: 01/04/2023 CLINICAL DATA:  History of right clear cell renal cell carcinoma status post total nephrectomy. Metastasis to the lung status post [facility] immunotherapy. * Tracking Code: BO *. EXAM: CT CHEST, ABDOMEN AND PELVIS WITHOUT CONTRAST TECHNIQUE: Multidetector CT imaging of the chest, abdomen and pelvis was performed following the standard protocol without IV contrast. RADIATION DOSE REDUCTION: This exam was performed according to the departmental dose-optimization program which includes automated exposure control, adjustment of the mA and/or kV according to  patient size and/or use of iterative reconstruction technique. COMPARISON:  Multiple priors including most recent CT July 03, 2022 FINDINGS: CT CHEST FINDINGS Cardiovascular: Aortic atherosclerosis. Normal size heart. No significant pericardial effusion/thickening Mediastinum/Nodes: No suspicious thyroid nodule. New enlarged mediastinal lymph nodes.  For reference: -Precarinal lymph node measuring 13 mm in short axis on image 25/2 previously measured 6 mm. -subcarinal lymph node measures 15 mm in short axis on image 30/2 previously 3 mm. Gas fluid levels in a patulous esophagus. Lungs/Pleura: Stable scattered tiny pulmonary nodules. Previously indexed nodules are as follows: -Nodule in the right lung apex measures 3 mm on image 30/3, unchanged. -Left upper lobe pulmonary nodule measures 3 mm on image 46/3, unchanged. New clustered nodularity and ground-glass in the peripheral right lower lobe on image 83/4. Musculoskeletal: No aggressive lytic or blastic lesion of bone. Intramuscular lipoma in the left paraspinal musculature on image 31/2. CT ABDOMEN PELVIS FINDINGS Hepatobiliary: Unremarkable noncontrast enhanced appearance of the hepatic parenchyma. Gallbladder is unremarkable. No biliary ductal dilation. Pancreas: No pancreatic ductal dilation or evidence of acute inflammation Spleen: No splenomegaly. Adrenals/Urinary Tract: Bilateral adrenal glands appear normal. Right kidney surgically absent without new suspicious soft tissue nodularity in the nephrectomy bed. Stable soft tissue density along the posterior aspect of the surgical bed measuring 2.5 x 9 mm on image 68/2 unchanged dating back to Sep 29, 2020. No left hydronephrosis or nephrolithiasis. 16 mm left renal hyperdensity on image 71/2 measuring Hounsfield units of 72 compatible with a hemorrhagic cyst. Urinary bladder is unremarkable for degree of distension. Stomach/Bowel: Stomach is within normal limits. Appendix appears normal. No evidence of  bowel wall thickening, distention, or inflammatory changes. Vascular/Lymphatic: Aortic atherosclerosis. Smooth IVC contours. No pathologically enlarged abdominal or pelvic lymph nodes Reproductive: Prostate is unremarkable. Other: No significant abdominopelvic free fluid. Musculoskeletal: No aggressive lytic or blastic lesion of bone IMPRESSION: 1. New clustered nodularity and ground-glass in the peripheral right lower lobe, likely infectious/inflammatory. Additionally there are new enlarged mediastinal lymph nodes which given the above findings are favored reactive but nonspecific. Suggest attention on short-term interval follow-up dedicated chest CT to ensure resolution. 2. Stable scattered tiny pulmonary nodules. 3. No evidence of local recurrence or metastatic disease in the abdomen or pelvis. 4.  Aortic Atherosclerosis (ICD10-I70.0). Electronically Signed   By: Maudry Mayhew M.D.   On: 01/04/2023 18:17

## 2023-01-11 NOTE — Assessment & Plan Note (Signed)
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 Currently off immunotherapy Surveillance CT scan was reviewed with patient and wife.  Right lung nodularity, favor reactive/inflammation changes.  I will repeat a CT chest without contrast in 3 months. Recommend continue surveillance imaging. Repeat CT chest abdomen pelvis in 6 months.

## 2023-01-12 ENCOUNTER — Other Ambulatory Visit: Payer: Self-pay

## 2023-01-17 ENCOUNTER — Other Ambulatory Visit: Payer: Self-pay

## 2023-01-28 NOTE — Progress Notes (Unsigned)
Established Patient Office Visit  Subjective    Patient ID: Matthew Woodard, male    DOB: [birth year]  Age: 57 y.o. MRN: [id number]  CC:  No chief complaint on file.   HPI Matthew Woodard presents to follow up. Reports no changes since last visit.   Hypertension: -Medications: Lisinopril 10 mg  -Patient is compliant with above medications and reports no side effects. -Denies any SOB, CP, vision changes, LE edema or symptoms of hypotension  HLD: -Medications: Crestor 20 mg -Patient is compliant with above medications and reports no side effects.  -Last lipid panel: Lipid Panel     Component Value Date/Time   CHOL 134 04/28/2022 1157   CHOL 157 11/02/2014 0825   CHOL 232 (H) 02/01/2013 0826   TRIG 103 04/28/2022 1157   TRIG 117 02/01/2013 0826   HDL 46 04/28/2022 1157   HDL 50 11/02/2014 0825   HDL 47 02/01/2013 0826   CHOLHDL 2.9 04/28/2022 1157   VLDL 28 01/08/2017 1012   VLDL 23 02/01/2013 0826   LDLCALC 69 04/28/2022 1157   LDLCALC 162 (H) 02/01/2013 0826   LABVLDL 27 11/02/2014 0825    Diabetes, Type 2: -Last A1c 7.2 3/24 -Medications: Jardiance 25 mg, Ozempic 1 mg weekly, Metformin 500 mg XR 4 tablets at night -Patient is compliant with the above medications and reports no side effects.  -Eye exam: UTD 1/23 -Foot exam: UTD 3/24 -Microalbumin: UTD 12/23 -Statin: yes -PNA vaccine: discussed, is considering  -Denies symptoms of hypoglycemia, polyuria, polydipsia, numbness extremities, foot ulcers/trauma.   Renal Cell Carcinoma:  -First diagnosed 06/2018, right kidney removed 07/2018; had spread to bladder and lungs but had been in remission for 1 year. -Following with Oncology Dr. Cathie Hoops, note reviewed from 07/06/21 -Last CT from 07/03/22 clear, planning repeat scan in August   GERD/Barrett's Esophagus: -Currently on Prilosec 20 mg PRN  Health Maintenance: -Colon cancer screening: colonoscopy 10/2021, repeat in 7 years -Blood work UTD   Outpatient  Encounter Medications as of 01/29/2023  Medication Sig   ALPRAZolam (XANAX) 0.5 MG tablet Take 1 tablet (0.5 mg total) by mouth at bedtime as needed.   aspirin 81 MG EC tablet Take 81 mg by mouth daily with supper.   clobetasol ointment (TEMOVATE) 0.05 % Apply 1 Application topically 2 (two) times daily as needed.   empagliflozin (JARDIANCE) 25 MG TABS tablet Take 1 tablet (25 mg total) by mouth daily before breakfast.   Glucose Blood (ACCU-CHEK AVIVA PLUS VI) by In Vitro route.   glucose blood test strip Check tid, one touch verio   lisinopril (ZESTRIL) 10 MG tablet Take 1 tablet (10 mg total) by mouth daily.   metFORMIN (GLUCOPHAGE-XR) 500 MG 24 hr tablet Take 2 tablets (1,000 mg total) by mouth 2 (two) times daily with a meal.   Multiple Vitamin (MULTIVITAMIN) tablet Take 1 tablet by mouth daily with supper.   omeprazole (PRILOSEC) 20 MG capsule Take 1 capsule (20 mg total) by mouth daily as needed (acid reflux).   rosuvastatin (CRESTOR) 20 MG tablet Take 1 tablet (20 mg total) by mouth daily with supper.   Semaglutide, 1 MG/DOSE, 4 MG/3ML SOPN Inject 1 mg as directed once a week.   No facility-administered encounter medications on file as of 01/29/2023.    Past Medical History:  Diagnosis Date   Arthritis    hands, left knee   Cancer (HCC)    Diabetes mellitus without complication (HCC)    Eczema 01/08/2017  GERD (gastroesophageal reflux disease)    History of kidney cancer    Hyperlipidemia LDL goal <100 11/02/2014   Hypertension    Kidney stone    YEAR H/O HEMATURIA   Psoriasis    Wears dentures    full upper, partial lower.  Has, does not wear    Past Surgical History:  Procedure Laterality Date   COLONOSCOPY N/A 10/21/2021   Procedure: COLONOSCOPY;  Surgeon: Midge Minium, MD;  Location: [facility];  Service: Endoscopy;  Laterality: N/A;   COLONOSCOPY WITH PROPOFOL N/A 02/23/2017   Procedure: COLONOSCOPY WITH PROPOFOL;  Surgeon: Midge Minium, MD;  Location: [facility]  SURG[facility];  Service: Endoscopy;  Laterality: N/A;  Diabetic - oral meds   CYST EXCISION  Aug. 2016   on back   CYSTOSCOPY W/ RETROGRADES Right 06/28/2018   Procedure: CYSTOSCOPY WITH RETROGRADE PYELOGRAM;  Surgeon: Sondra Come, MD;  Location: [facility] ORS;  Service: Urology;  Laterality: Right;   CYSTOSCOPY W/ URETERAL STENT REMOVAL Right 07/22/2018   Procedure: CYSTOSCOPY WITH STENT REMOVAL;  Surgeon: Sondra Come, MD;  Location: [facility] ORS;  Service: Urology;  Laterality: Right;   CYSTOSCOPY WITH BIOPSY Right 06/28/2018   Procedure: CYSTOSCOPY WITH RENAL BIOPSY;  Surgeon: Sondra Come, MD;  Location: [facility] ORS;  Service: Urology;  Laterality: Right;   CYSTOSCOPY WITH URETEROSCOPY AND STENT PLACEMENT Right 06/28/2018   Procedure: CYSTOSCOPY WITH URETEROSCOPY AND STENT PLACEMENT;  Surgeon: Sondra Come, MD;  Location: [facility] ORS;  Service: Urology;  Laterality: Right;   ESOPHAGOGASTRODUODENOSCOPY N/A 10/21/2021   Procedure: ESOPHAGOGASTRODUODENOSCOPY (EGD);  Surgeon: Midge Minium, MD;  Location: [facility];  Service: Endoscopy;  Laterality: N/A;   FULGURATION OF BLADDER TUMOR N/A 06/28/2018   Procedure: CYSTOSCOPY BLADDER BIOPSY, FULGERATION OF BLADDER;  Surgeon: Sondra Come, MD;  Location: [facility] OR[facility];  Service: Urology;  Laterality: N/A;   KNEE SURGERY Left    LAPAROSCOPIC NEPHRECTOMY, HAND ASSISTED Right 07/22/2018   Procedure: HAND ASSISTED LAPAROSCOPIC NEPHRECTOMY;  Surgeon: Sondra Come, MD;  Location: [facility] OR[facility];  Service: Urology;  Laterality: Right;   NASAL SINUS SURGERY  03/15   POLYPECTOMY N/A 02/23/2017   Procedure: POLYPECTOMY;  Surgeon: Midge Minium, MD;  Location: [facility] SUR[facility];  Service: Endoscopy;  Laterality: N/A;   POLYPECTOMY  10/21/2021   Procedure: POLYPECTOMY;  Surgeon: Midge Minium, MD;  Location: [facility];  Service: Endoscopy;;   tooth abstraction     TRANSURETHRAL RESECTION OF BLADDER TUMOR N/A 05/12/2019   Procedure: TRANSURETHRAL  RESECTION OF BLADDER TUMOR (TURBT);  Surgeon: Sondra Come, MD;  Location: [facility] ORS;  Service: Urology;  Laterality: N/A;   VARICOCELE EXCISION     XI ROBOTIC ASSISTED VENTRAL HERNIA N/A 11/18/2020   Procedure: XI ROBOTIC ASSISTED VENTRAL HERNIA, incisional;  Surgeon: Leafy Ro, MD;  Location: [facility] ORS;  Service: General;  Laterality: N/A;    Family History  Problem Relation Age of Onset   Heart disease Father    Hypertension Father    COPD Father    Colon cancer Maternal Grandmother    Heart attack Paternal Grandfather     Social History   Socioeconomic History   Marital status: Married    Spouse name: Isabelle Course   Number of children: Not on file   Years of education: Not on file   Highest education level: Not on file  Occupational History   Not on file  Tobacco Use   Smoking status: Former    Current packs/day: 0.00  Types: Cigarettes    Quit date: 06/15/2013    Years since quitting: 9.6   Smokeless tobacco: Never  Vaping Use   Vaping status: Never Used  Substance and Sexual Activity   Alcohol use: No    Alcohol/week: 0.0 standard drinks of alcohol   Drug use: No   Sexual activity: Yes  Other Topics Concern   Not on file  Social History Narrative   Lives with wife, sister in law   Social Determinants of Health   Financial Resource Strain: Low Risk  (09/13/2021)   Received from [facility], [facility]   Overall Financial Resource Strain (CARDIA)    Difficulty of Paying Living Expenses: Not hard at all  Food Insecurity: No Food Insecurity (09/13/2021)   Received from [facility], [facility]   Hunger Vital Sign    Worried About Running Out of Food in the Last Year: Never true    Ran Out of Food in the Last Year: Never true  Transportation Needs: No Transportation Needs (09/13/2021)   Received from [facility], [facility]   [facility] - Transportation    Lack of Transportation (Medical): No    Lack of Transportation (Non-Medical): No   Physical Activity: Not on file  Stress: Not on file  Social Connections: Not on file  Intimate Partner Violence: Not on file    Review of Systems  Constitutional:  Negative for chills and fever.  Eyes:  Negative for blurred vision.  Respiratory:  Negative for shortness of breath.   Cardiovascular:  Negative for chest pain.  Gastrointestinal:  Negative for abdominal pain, nausea and vomiting.  Neurological:  Negative for headaches.        Objective    There were no vitals taken for this visit.  Physical Exam Constitutional:      Appearance: Normal appearance.  HENT:     Head: Normocephalic and atraumatic.  Eyes:     Conjunctiva/sclera: Conjunctivae normal.  Cardiovascular:     Rate and Rhythm: Normal rate and regular rhythm.     Pulses:          Dorsalis pedis pulses are 2+ on the right side and 2+ on the left side.  Pulmonary:     Effort: Pulmonary effort is normal.     Breath sounds: Normal breath sounds.  Musculoskeletal:     Right foot: Normal range of motion. No deformity, bunion, Charcot foot, foot drop or prominent metatarsal heads.     Left foot: Normal range of motion. No deformity, bunion, Charcot foot, foot drop or prominent metatarsal heads.  Feet:     Right foot:     Protective Sensation: 6 sites tested.  6 sites sensed.     Skin integrity: Skin integrity normal.     Toenail Condition: Right toenails are normal.     Left foot:     Protective Sensation: 6 sites tested.  6 sites sensed.     Skin integrity: Skin integrity normal.     Toenail Condition: Left toenails are normal.  Skin:    General: Skin is warm and dry.  Neurological:     General: No focal deficit present.     Mental Status: He is alert. Mental status is at baseline.  Psychiatric:        Mood and Affect: Mood normal.        Behavior: Behavior normal.         Assessment & Plan:   1. Type 2  diabetes mellitus with hyperglycemia, without long-term current use of insulin (HCC): A1c  7.2%. Foot exam today. Continue Jardiance 25 mg, Metformin switched to 1000 mg BID XR. Continue Ozempic 1 mg weekly, refilled. Follow up in 6 months.   - POCT HgB A1C - HM Diabetes Foot Exam - metformin (FORTAMET) 1000 MG (OSM) 24 hr tablet; Take 1 tablet (1,000 mg total) by mouth 2 (two) times daily with a meal.  Dispense: 180 tablet; Refill: 1 - OZEMPIC, 0.25 OR 0.5 MG/DOSE, 2 MG/1.5ML SOPN; Inject 1 mg into the skin every Sunday.  Dispense: 3 mL; Refill: 6  2. Benign essential HTN: Chronic and stable, continue Lisinopril 10 mg, refilled.  - lisinopril (ZESTRIL) 10 MG tablet; Take 1 tablet (10 mg total) by mouth daily.  Dispense: 90 tablet; Refill: 1  3. Hyperlipidemia LDL goal <100: Reviewed labs from December with the patient, cholesterol well controlled. Continue Crestor 20 mg.   - rosuvastatin (CRESTOR) 20 MG tablet; Take 1 tablet (20 mg total) by mouth daily with supper.  Dispense: 90 tablet; Refill: 1  4. 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   5. Gastroesophageal reflux disease, unspecified whether esophagitis present: Controlled, taking Prilosec as needed.   No follow-ups on file.   Margarita Mail, DO

## 2023-01-29 ENCOUNTER — Ambulatory Visit: Payer: 59 | Admitting: Internal Medicine

## 2023-01-29 ENCOUNTER — Encounter: Payer: Self-pay | Admitting: Internal Medicine

## 2023-01-29 VITALS — BP 138/82 | HR 88 | Temp 97.7°F | Ht 72.0 in | Wt 209.1 lb

## 2023-01-29 DIAGNOSIS — I10 Essential (primary) hypertension: Secondary | ICD-10-CM

## 2023-01-29 DIAGNOSIS — Z7984 Long term (current) use of oral hypoglycemic drugs: Secondary | ICD-10-CM

## 2023-01-29 DIAGNOSIS — J3489 Other specified disorders of nose and nasal sinuses: Secondary | ICD-10-CM

## 2023-01-29 DIAGNOSIS — R062 Wheezing: Secondary | ICD-10-CM

## 2023-01-29 DIAGNOSIS — K219 Gastro-esophageal reflux disease without esophagitis: Secondary | ICD-10-CM | POA: Diagnosis not present

## 2023-01-29 DIAGNOSIS — E1165 Type 2 diabetes mellitus with hyperglycemia: Secondary | ICD-10-CM

## 2023-01-29 DIAGNOSIS — E785 Hyperlipidemia, unspecified: Secondary | ICD-10-CM | POA: Diagnosis not present

## 2023-01-29 LAB — POCT GLYCOSYLATED HEMOGLOBIN (HGB A1C): Hemoglobin A1C: 7 % — AB (ref 4.0–5.6)

## 2023-01-29 MED ORDER — OMEPRAZOLE 20 MG PO CPDR
20.0000 mg | DELAYED_RELEASE_CAPSULE | Freq: Every day | ORAL | 0 refills | Status: DC | PRN
Start: 2023-01-29 — End: 2023-05-30

## 2023-01-29 MED ORDER — EMPAGLIFLOZIN 25 MG PO TABS
25.0000 mg | ORAL_TABLET | Freq: Every day | ORAL | 1 refills | Status: DC
Start: 2023-01-29 — End: 2023-07-11

## 2023-01-29 MED ORDER — ROSUVASTATIN CALCIUM 20 MG PO TABS
20.0000 mg | ORAL_TABLET | Freq: Every day | ORAL | 1 refills | Status: DC
Start: 2023-01-29 — End: 2023-07-11

## 2023-01-29 MED ORDER — LISINOPRIL 10 MG PO TABS
10.0000 mg | ORAL_TABLET | Freq: Every day | ORAL | 1 refills | Status: DC
Start: 2023-01-29 — End: 2023-07-11

## 2023-01-29 MED ORDER — FLUTICASONE PROPIONATE 50 MCG/ACT NA SUSP: 2.0000 | Freq: Every day | NASAL | 1 refills | Status: AC

## 2023-01-29 MED ORDER — METHYLPREDNISOLONE 4 MG PO TBPK
ORAL_TABLET | ORAL | 0 refills | Status: DC
Start: 2023-01-29 — End: 2023-04-30

## 2023-02-02 ENCOUNTER — Other Ambulatory Visit: Payer: Self-pay | Admitting: Oncology

## 2023-03-08 ENCOUNTER — Telehealth: Payer: Self-pay | Admitting: Internal Medicine

## 2023-03-08 NOTE — Telephone Encounter (Signed)
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

## 2023-03-11 ENCOUNTER — Other Ambulatory Visit: Payer: Self-pay | Admitting: Internal Medicine

## 2023-03-12 MED ORDER — CLOBETASOL PROPIONATE 0.05 % EX OINT: 1.0000 | TOPICAL_OINTMENT | Freq: Two times a day (BID) | CUTANEOUS | 0 refills | Status: DC | PRN

## 2023-04-04 ENCOUNTER — Encounter: Payer: Self-pay | Admitting: Internal Medicine

## 2023-04-09 ENCOUNTER — Ambulatory Visit
Admission: RE | Admit: 2023-04-09 | Discharge: 2023-04-09 | Disposition: A | Discharge status: Home / Self Care | Payer: 59 | Source: Ambulatory Visit | Attending: Oncology | Admitting: Oncology

## 2023-04-09 DIAGNOSIS — C641 Malignant neoplasm of right kidney, except renal pelvis: Secondary | ICD-10-CM | POA: Diagnosis present

## 2023-04-25 ENCOUNTER — Telehealth: Payer: Self-pay

## 2023-04-25 DIAGNOSIS — R911 Solitary pulmonary nodule: Secondary | ICD-10-CM

## 2023-04-25 NOTE — Telephone Encounter (Signed)
Pulmonology referral placed in EPIC.

## 2023-04-30 ENCOUNTER — Ambulatory Visit (INDEPENDENT_AMBULATORY_CARE_PROVIDER_SITE_OTHER): Payer: 59 | Admitting: Pulmonary Disease

## 2023-04-30 ENCOUNTER — Encounter: Payer: Self-pay | Admitting: Internal Medicine

## 2023-04-30 VITALS — BP 130/80 | HR 78 | Temp 98.4°F | Ht 72.0 in | Wt 207.2 lb

## 2023-04-30 DIAGNOSIS — R590 Localized enlarged lymph nodes: Secondary | ICD-10-CM | POA: Diagnosis not present

## 2023-04-30 DIAGNOSIS — R0602 Shortness of breath: Secondary | ICD-10-CM

## 2023-04-30 MED ORDER — ALBUTEROL SULFATE HFA 108 (90 BASE) MCG/ACT IN AERS
2.0000 | INHALATION_SPRAY | Freq: Four times a day (QID) | RESPIRATORY_TRACT | 2 refills | Status: DC | PRN
Start: 2023-04-30 — End: 2023-10-01

## 2023-04-30 MED ORDER — UMECLIDINIUM-VILANTEROL 62.5-25 MCG/ACT IN AEPB
1.0000 | INHALATION_SPRAY | Freq: Every day | RESPIRATORY_TRACT | 3 refills | Status: DC
Start: 2023-04-30 — End: 2023-08-03

## 2023-04-30 NOTE — Progress Notes (Signed)
Synopsis: Referred in by Rickard Patience, MD   Subjective:   PATIENT ID: Matthew Woodard GENDER: male DOB: 1965-07-04, MRN: [id number]  Chief Complaint  Patient presents with   pulmonary consult    CT 12/8-- dry cough at times prod cough with clear sputum.     HPI Matthew Woodard is a pleasant 57 year old male patient with a past medical history of renal cell carcinoma with mets to the bladder and lungs (CT chest 05/2019) status post left nephrectomy, chemotherapy and immunotherapy with Keytruda up until February 2023.  Since then he has been followed with CT chest abdomen pelvis every 3 months.  He is presenting today to the pulmonary clinic as a referral from Dr. Cathie Hoops for follow-up regarding lung nodule seen on recent CT.  His most recent CT chest in November 2024 revealed stable paratracheal and subcarinal adenopathy but this was compared to the CT chest from Aug 2024. Comparing it to the CT chest in Feb 2024 these mediastinal LN are new and significantly increased in size. There is also a new 6mm LUL nodule with ground glass component.   He reports being asymptomatic, even denies any symptoms of shortness of breath, chest pain, chest tightness.   Family history - Father with COPD  Social history - Previous smoker. Smoked 1 ppd for 35 years. Quit in 2015. Continues to smoke marijuana regularly.    ROS All systems were reviewed and are negative except for the above.  Objective:   Vitals:   04/30/23 0900  BP: 130/80  Pulse: 78  Temp: 98.4 F (36.9 C)  TempSrc: Oral  SpO2: 100%  Weight: 207 lb 3.2 oz (94 kg)  Height: 6' (1.829 m)   100% on RA BMI Readings from Last 3 Encounters:  04/30/23 28.10 kg/m  01/29/23 28.36 kg/m  01/11/23 28.68 kg/m   Wt Readings from Last 3 Encounters:  04/30/23 207 lb 3.2 oz (94 kg)  01/29/23 209 lb 1.6 oz (94.8 kg)  01/11/23 211 lb 8 oz (95.9 kg)    Physical Exam GEN: NAD HEENT: Supple Neck, Reactive Pupils, EOMI  CVS: Normal S1, Normal S2,  RRR, No murmurs or ES appreciated  Lungs: Diffuse wheezing bilaterally  Abdomen: Soft, non tender, non distended, + BS  Extremities: Warm and well perfused, No edema  Skin: No suspicious lesions appreciated  Psych: Normal Affect  Labs and imaging were reviewed Ancillary Information   CBC    Component Value Date/Time   WBC 7.4 01/11/2023 1404   WBC 7.6 07/06/2022 1029   RBC 4.72 01/11/2023 1404   HGB 14.2 01/11/2023 1404   HCT 42.5 01/11/2023 1404   PLT 217 01/11/2023 1404   MCV 90.0 01/11/2023 1404   MCH 30.1 01/11/2023 1404   MCHC 33.4 01/11/2023 1404   RDW 12.4 01/11/2023 1404   LYMPHSABS 2.0 01/11/2023 1404   MONOABS 0.5 01/11/2023 1404   EOSABS 0.1 01/11/2023 1404   BASOSABS 0.0 01/11/2023 1404        No data to display           Assessment & Plan:  Matthew Woodard is a pleasant 57 year old male patient with a past medical history of renal cell carcinoma with mets to the bladder and lungs (CT chest 05/2019) status post left nephrectomy, chemotherapy and immunotherapy with Keytruda up until February 2023.  Since then he has been followed with CT chest abdomen pelvis every 3 months.  He is presenting today to the pulmonary clinic as a referral from Dr.  Yu for follow-up regarding lung nodule seen on recent CT.  His most recent CT chest done in 03/2023 shows slightly worse mediastinal LAN compared to the CT done 3 months prior. However these are new since 6 months and given patient's history of RCC with mets to the lungs, this is concerning for recurrent disease. I will schedule him for an EBUS TBNA on 12/19. This was discussed with Matthew Woodard and is agreable. Furthermore he has diffuse expiratory wheezing raising concern for COPD, no pfts on file.   []  EBUS TBNA 05/03/2023  []  PFTs  []  Start Umeclidinium-Vilanterol [Anoro Ellipta] 62.5-25 mvg 1 puff daily. In office inhaler use training provided.  []  Start Albuterol 2puffs Q6H as needed   Return in about 11 weeks (around  07/16/2023).  I spent 60 minutes caring for this patient today, including preparing to see the patient, obtaining a medical history , reviewing a separately obtained history, performing a medically appropriate examination and/or evaluation, counseling and educating the patient/family/caregiver, ordering medications, tests, or procedures, documenting clinical information in the electronic health record, and independently interpreting results (not separately reported/billed) and communicating results to the patient/family/caregiver  Janann Colonel, MD [facility] 04/30/2023 2:54 PM

## 2023-04-30 NOTE — H&P (View-Only) (Signed)
Synopsis: Referred in by Rickard Patience, MD   Subjective:   PATIENT ID: Matthew Woodard GENDER: male DOB: 1965-07-04, MRN: [id number]  Chief Complaint  Patient presents with   pulmonary consult    CT 12/8-- dry cough at times prod cough with clear sputum.     HPI Matthew Woodard is a pleasant 57 year old male patient with a past medical history of renal cell carcinoma with mets to the bladder and lungs (CT chest 05/2019) status post left nephrectomy, chemotherapy and immunotherapy with Keytruda up until February 2023.  Since then he has been followed with CT chest abdomen pelvis every 3 months.  He is presenting today to the pulmonary clinic as a referral from Dr. Cathie Hoops for follow-up regarding lung nodule seen on recent CT.  His most recent CT chest in November 2024 revealed stable paratracheal and subcarinal adenopathy but this was compared to the CT chest from Aug 2024. Comparing it to the CT chest in Feb 2024 these mediastinal LN are new and significantly increased in size. There is also a new 6mm LUL nodule with ground glass component.   He reports being asymptomatic, even denies any symptoms of shortness of breath, chest pain, chest tightness.   Family history - Father with COPD  Social history - Previous smoker. Smoked 1 ppd for 35 years. Quit in 2015. Continues to smoke marijuana regularly.    ROS All systems were reviewed and are negative except for the above.  Objective:   Vitals:   04/30/23 0900  BP: 130/80  Pulse: 78  Temp: 98.4 F (36.9 C)  TempSrc: Oral  SpO2: 100%  Weight: 207 lb 3.2 oz (94 kg)  Height: 6' (1.829 m)   100% on RA BMI Readings from Last 3 Encounters:  04/30/23 28.10 kg/m  01/29/23 28.36 kg/m  01/11/23 28.68 kg/m   Wt Readings from Last 3 Encounters:  04/30/23 207 lb 3.2 oz (94 kg)  01/29/23 209 lb 1.6 oz (94.8 kg)  01/11/23 211 lb 8 oz (95.9 kg)    Physical Exam GEN: NAD HEENT: Supple Neck, Reactive Pupils, EOMI  CVS: Normal S1, Normal S2,  RRR, No murmurs or ES appreciated  Lungs: Diffuse wheezing bilaterally  Abdomen: Soft, non tender, non distended, + BS  Extremities: Warm and well perfused, No edema  Skin: No suspicious lesions appreciated  Psych: Normal Affect  Labs and imaging were reviewed Ancillary Information   CBC    Component Value Date/Time   WBC 7.4 01/11/2023 1404   WBC 7.6 07/06/2022 1029   RBC 4.72 01/11/2023 1404   HGB 14.2 01/11/2023 1404   HCT 42.5 01/11/2023 1404   PLT 217 01/11/2023 1404   MCV 90.0 01/11/2023 1404   MCH 30.1 01/11/2023 1404   MCHC 33.4 01/11/2023 1404   RDW 12.4 01/11/2023 1404   LYMPHSABS 2.0 01/11/2023 1404   MONOABS 0.5 01/11/2023 1404   EOSABS 0.1 01/11/2023 1404   BASOSABS 0.0 01/11/2023 1404        No data to display           Assessment & Plan:  Matthew Woodard is a pleasant 57 year old male patient with a past medical history of renal cell carcinoma with mets to the bladder and lungs (CT chest 05/2019) status post left nephrectomy, chemotherapy and immunotherapy with Keytruda up until February 2023.  Since then he has been followed with CT chest abdomen pelvis every 3 months.  He is presenting today to the pulmonary clinic as a referral from Dr.  Yu for follow-up regarding lung nodule seen on recent CT.  His most recent CT chest done in 03/2023 shows slightly worse mediastinal LAN compared to the CT done 3 months prior. However these are new since 6 months and given patient's history of RCC with mets to the lungs, this is concerning for recurrent disease. I will schedule him for an EBUS TBNA on 12/19. This was discussed with Mr. Grgas and is agreable. Furthermore he has diffuse expiratory wheezing raising concern for COPD, no pfts on file.   []  EBUS TBNA 05/03/2023  []  PFTs  []  Start Umeclidinium-Vilanterol [Anoro Ellipta] 62.5-25 mvg 1 puff daily. In office inhaler use training provided.  []  Start Albuterol 2puffs Q6H as needed   Return in about 11 weeks (around  07/16/2023).  I spent 60 minutes caring for this patient today, including preparing to see the patient, obtaining a medical history , reviewing a separately obtained history, performing a medically appropriate examination and/or evaluation, counseling and educating the patient/family/caregiver, ordering medications, tests, or procedures, documenting clinical information in the electronic health record, and independently interpreting results (not separately reported/billed) and communicating results to the patient/family/caregiver  Janann Colonel, MD [facility] 04/30/2023 2:54 PM

## 2023-05-01 ENCOUNTER — Telehealth: Payer: Self-pay | Admitting: Pulmonary Disease

## 2023-05-01 ENCOUNTER — Encounter: Payer: Self-pay | Admitting: Oncology

## 2023-05-01 ENCOUNTER — Other Ambulatory Visit: Payer: Self-pay | Admitting: Oncology

## 2023-05-01 MED ORDER — ALPRAZOLAM 0.5 MG PO TABS: 0.5000 mg | ORAL_TABLET | Freq: Two times a day (BID) | ORAL | 0 refills | Status: DC | PRN

## 2023-05-01 NOTE — Telephone Encounter (Signed)
Pt requesting Xanax script. Please advise.

## 2023-05-01 NOTE — Telephone Encounter (Signed)
Diannia Ruder states needs surgical orders for patient. Diannia Ruder phone number is [phone].

## 2023-05-02 ENCOUNTER — Encounter (HOSPITAL_COMMUNITY): Payer: Self-pay | Admitting: Pulmonary Disease

## 2023-05-02 ENCOUNTER — Other Ambulatory Visit: Payer: Self-pay

## 2023-05-02 NOTE — Progress Notes (Signed)
SDW CALL  Patient was given pre-op instructions over the phone. The opportunity was given for the patient to ask questions. No further questions asked. Patient verbalized understanding of instructions given.   PCP - Scarlette Calico Cardiologist - denies  PPM/ICD - denies Device Orders -  Rep Notified -   Chest x-ray - CT-04/09/23 EKG - DOS Stress Test - denies ECHO - denies Cardiac Cath - denies  Sleep Study - denies CPAP -   Fasting Blood Sugar - 110-120 Checks Blood Sugar "every week or two"  Blood Thinner Instructions:na Aspirin Instructions:na  ERAS Protcol -no PRE-SURGERY Ensure or G2-   COVID TEST- na  Shams,SUE (Spouse) [phone] (Mobile)  Anesthesia review: yes- patient took Semaglutide 12/15,but his procedure was not scheduled until 12/16.  Patient denies shortness of breath, fever, cough and chest pain over the phone call    Surgical Instructions    Your procedure is scheduled on December 19.  Report to [facility] Main Entrance "A" at 1000 A.M., then check in with the Admitting office.  Call this number if you have problems the morning of surgery:  [phone]    Remember:  Do not eat or drink anything  after midnight the night before your surgery   Take these medicines the morning of surgery with A SIP OF WATER: Flonase,Anora Ellipta. PRN- Xanax,Prilosec,Albuterol-bring to the hospital .  As of today, STOP taking any Aspirin (unless otherwise instructed by your surgeon) Aleve, Naproxen, Ibuprofen, Motrin, Advil, Goody's, BC's, all herbal medications, fish oil, and all vitamins.  WHAT DO I DO ABOUT MY DIABETES MEDICATION?       Do not take Semaglutide prior to surgery. Do not take Jardiance the night before or the morning of surgery.  Do not take oral diabetes medicines (pills) the morning of surgery.Do not take Metformin(Glucophage) the morning of surgery.    Check your blood sugar the morning of your surgery when you wake up and every  2 hours until you get to the Short Stay unit.  If your blood sugar is less than 70 mg/dL, you will need to treat for low blood sugar: Do not take insulin. Treat a low blood sugar (less than 70 mg/dL) with  cup of clear juice (cranberry or apple), 4 glucose tablets, OR glucose gel. Recheck blood sugar in 15 minutes after treatment (to make sure it is greater than 70 mg/dL). If your blood sugar is not greater than 70 mg/dL on recheck, call [phone] for further instructions. Report your blood sugar to the short stay nurse when you get to Short Stay.  If you are admitted to the hospital after surgery: Your blood sugar will be checked by the staff and you will probably be given insulin after surgery (instead of oral diabetes medicines) to make sure you have good blood sugar levels. The goal for blood sugar control after surgery is 80-180 mg/dL.  [place] is not responsible for any belongings or valuables. .   Do NOT Smoke (Tobacco/Vaping)  24 hours prior to your procedure  If you use a CPAP at night, you may bring your mask for your overnight stay.   Contacts, glasses, hearing aids, dentures or partials may not be worn into surgery, please bring cases for these belongings   Patients discharged the day of surgery will not be allowed to drive home, and someone needs to stay with them for 24 hours.   Special instructions:    Oral Hygiene is also important to reduce your risk of infection.  Remember - BRUSH YOUR TEETH THE MORNING OF SURGERY WITH YOUR REGULAR TOOTHPASTE   Day of Surgery:  Take a shower the day of or night before with antibacterial soap. Wear Clean/Comfortable clothing the morning of surgery Do not apply any deodorants/lotions.   Do not wear jewelry or makeup Do not wear lotions, powders, perfumes/colognes, or deodorant. Do not shave 48 hours prior to surgery.  Men may shave face and neck. Do not bring valuables to the hospital. Do not wear nail polish, gel polish,  artificial nails, or any other type of covering on natural nails (fingers and toes) If you have artificial nails or gel coating that need to be removed by a nail salon, please have this removed prior to surgery. Artificial nails or gel coating may interfere with anesthesia's ability to adequately monitor your vital signs. Remember to brush your teeth WITH YOUR REGULAR TOOTHPASTE.

## 2023-05-03 ENCOUNTER — Ambulatory Visit (HOSPITAL_COMMUNITY)
Admission: RE | Admit: 2023-05-03 | Discharge: 2023-05-03 | Disposition: A | Discharge status: Home / Self Care | Payer: 59 | Attending: Pulmonary Disease | Admitting: Pulmonary Disease

## 2023-05-03 ENCOUNTER — Ambulatory Visit (HOSPITAL_COMMUNITY): Payer: 59

## 2023-05-03 ENCOUNTER — Encounter (HOSPITAL_COMMUNITY): Payer: Self-pay | Admitting: Pulmonary Disease

## 2023-05-03 ENCOUNTER — Ambulatory Visit: Payer: 59 | Admitting: Internal Medicine

## 2023-05-03 ENCOUNTER — Encounter (HOSPITAL_COMMUNITY)
Admission: RE | Disposition: A | Discharge status: Home / Self Care | Payer: Self-pay | Source: Home / Self Care | Attending: Pulmonary Disease

## 2023-05-03 ENCOUNTER — Ambulatory Visit (HOSPITAL_BASED_OUTPATIENT_CLINIC_OR_DEPARTMENT_OTHER): Payer: 59

## 2023-05-03 DIAGNOSIS — C771 Secondary and unspecified malignant neoplasm of intrathoracic lymph nodes: Secondary | ICD-10-CM | POA: Insufficient documentation

## 2023-05-03 DIAGNOSIS — Z87891 Personal history of nicotine dependence: Secondary | ICD-10-CM | POA: Insufficient documentation

## 2023-05-03 DIAGNOSIS — Z85528 Personal history of other malignant neoplasm of kidney: Secondary | ICD-10-CM | POA: Insufficient documentation

## 2023-05-03 DIAGNOSIS — C969 Malignant neoplasm of lymphoid, hematopoietic and related tissue, unspecified: Secondary | ICD-10-CM

## 2023-05-03 DIAGNOSIS — Z8589 Personal history of malignant neoplasm of other organs and systems: Secondary | ICD-10-CM | POA: Insufficient documentation

## 2023-05-03 DIAGNOSIS — J449 Chronic obstructive pulmonary disease, unspecified: Secondary | ICD-10-CM | POA: Insufficient documentation

## 2023-05-03 DIAGNOSIS — K219 Gastro-esophageal reflux disease without esophagitis: Secondary | ICD-10-CM | POA: Insufficient documentation

## 2023-05-03 DIAGNOSIS — Z79899 Other long term (current) drug therapy: Secondary | ICD-10-CM | POA: Insufficient documentation

## 2023-05-03 DIAGNOSIS — Z9221 Personal history of antineoplastic chemotherapy: Secondary | ICD-10-CM | POA: Insufficient documentation

## 2023-05-03 DIAGNOSIS — E119 Type 2 diabetes mellitus without complications: Secondary | ICD-10-CM | POA: Insufficient documentation

## 2023-05-03 DIAGNOSIS — Z905 Acquired absence of kidney: Secondary | ICD-10-CM | POA: Insufficient documentation

## 2023-05-03 DIAGNOSIS — R590 Localized enlarged lymph nodes: Secondary | ICD-10-CM | POA: Diagnosis present

## 2023-05-03 DIAGNOSIS — I10 Essential (primary) hypertension: Secondary | ICD-10-CM | POA: Insufficient documentation

## 2023-05-03 HISTORY — PX: VIDEO BRONCHOSCOPY WITH ENDOBRONCHIAL ULTRASOUND: SHX6177

## 2023-05-03 HISTORY — DX: Chronic obstructive pulmonary disease, unspecified: J44.9

## 2023-05-03 HISTORY — PX: BRONCHIAL NEEDLE ASPIRATION BIOPSY: SHX5106

## 2023-05-03 LAB — GLUCOSE, CAPILLARY
Glucose-Capillary: 128 mg/dL — ABNORMAL HIGH (ref 70–99)
Glucose-Capillary: 106 mg/dL — ABNORMAL HIGH (ref 70–99)
Glucose-Capillary: 123 mg/dL — ABNORMAL HIGH (ref 70–99)

## 2023-05-03 LAB — CBC
HCT: 43.8 % (ref 39.0–52.0)
Hemoglobin: 14.4 g/dL (ref 13.0–17.0)
MCH: 29.8 pg (ref 26.0–34.0)
MCHC: 32.9 g/dL (ref 30.0–36.0)
MCV: 90.5 fL (ref 80.0–100.0)
Platelets: 232 10*3/uL (ref 150–400)
RBC: 4.84 MIL/uL (ref 4.22–5.81)
RDW: 12.4 % (ref 11.5–15.5)
WBC: 7.3 10*3/uL (ref 4.0–10.5)
nRBC: 0 % (ref 0.0–0.2)

## 2023-05-03 LAB — COMPREHENSIVE METABOLIC PANEL
ALT: 15 U/L (ref 0–44)
AST: 16 U/L (ref 15–41)
Albumin: 3.7 g/dL (ref 3.5–5.0)
Alkaline Phosphatase: 68 U/L (ref 38–126)
Anion gap: 9 (ref 5–15)
BUN: 18 mg/dL (ref 6–20)
CO2: 22 mmol/L (ref 22–32)
Calcium: 9.2 mg/dL (ref 8.9–10.3)
Chloride: 106 mmol/L (ref 98–111)
Creatinine, Ser: 0.83 mg/dL (ref 0.61–1.24)
GFR, Estimated: >60 mL/min (ref >60)
Glucose, Bld: 116 mg/dL — ABNORMAL HIGH (ref 70–99)
Potassium: 4.1 mmol/L (ref 3.5–5.1)
Sodium: 137 mmol/L (ref 135–145)
Total Bilirubin: 0.7 mg/dL (ref <1.2)
Total Protein: 6.5 g/dL (ref 6.5–8.1)

## 2023-05-03 SURGERY — BRONCHOSCOPY, WITH EBUS
Anesthesia: General

## 2023-05-03 MED ORDER — CHLORHEXIDINE GLUCONATE 0.12 % MT SOLN
OROMUCOSAL | Status: AC
  Administered 2023-05-03: 15 mL
  Filled 2023-05-03: qty 15

## 2023-05-03 MED ORDER — DEXAMETHASONE SODIUM PHOSPHATE 10 MG/ML IJ SOLN
INTRAMUSCULAR | Status: DC | PRN
  Administered 2023-05-03: 10 mg via INTRAVENOUS

## 2023-05-03 MED ORDER — HYDRALAZINE HCL 20 MG/ML IJ SOLN
INTRAMUSCULAR | Status: DC | PRN
  Administered 2023-05-03: 10 mg via INTRAVENOUS

## 2023-05-03 MED ORDER — SODIUM CHLORIDE 0.9 % IV SOLN: INTRAVENOUS | Status: DC | PRN

## 2023-05-03 MED ORDER — ONDANSETRON HCL 4 MG/2ML IJ SOLN
INTRAMUSCULAR | Status: DC | PRN
  Administered 2023-05-03: 4 mg via INTRAVENOUS

## 2023-05-03 MED ORDER — ROCURONIUM BROMIDE 10 MG/ML (PF) SYRINGE
PREFILLED_SYRINGE | INTRAVENOUS | Status: DC | PRN
  Administered 2023-05-03: 70 mg via INTRAVENOUS

## 2023-05-03 MED ORDER — SUGAMMADEX SODIUM 200 MG/2ML IV SOLN
INTRAVENOUS | Status: DC | PRN
  Administered 2023-05-03: 200 mg via INTRAVENOUS

## 2023-05-03 MED ORDER — ETOMIDATE 2 MG/ML IV SOLN
INTRAVENOUS | Status: DC | PRN
  Administered 2023-05-03: 2 mg via INTRAVENOUS
  Administered 2023-05-03: 4 mg via INTRAVENOUS
  Administered 2023-05-03: 10 mg via INTRAVENOUS
  Administered 2023-05-03: 2 mg via INTRAVENOUS

## 2023-05-03 MED ORDER — SODIUM CHLORIDE 0.9 % IV SOLN: INTRAVENOUS | Status: DC

## 2023-05-03 MED ORDER — DEXMEDETOMIDINE HCL IN NACL 80 MCG/20ML IV SOLN
INTRAVENOUS | Status: DC | PRN
  Administered 2023-05-03 (×2): 10 ug via INTRAVENOUS

## 2023-05-03 MED ORDER — METOPROLOL TARTRATE 5 MG/5ML IV SOLN
INTRAVENOUS | Status: DC | PRN
  Administered 2023-05-03: 1 mg via INTRAVENOUS
  Administered 2023-05-03: 2.5 mg via INTRAVENOUS

## 2023-05-03 MED ORDER — ALBUTEROL SULFATE HFA 108 (90 BASE) MCG/ACT IN AERS
INHALATION_SPRAY | RESPIRATORY_TRACT | Status: DC | PRN
  Administered 2023-05-03: 6 via RESPIRATORY_TRACT

## 2023-05-03 MED ORDER — LIDOCAINE 2% (20 MG/ML) 5 ML SYRINGE
INTRAMUSCULAR | Status: DC | PRN
  Administered 2023-05-03: 60 mg via INTRAVENOUS

## 2023-05-03 NOTE — Op Note (Signed)
Flexible and EBUS Bronchoscopy Procedure Note  Matthew Woodard  [id number]  09/30/1965  Date:05/03/23  Time:1:54 PM   Provider Performing:Jean-Pierre Fendi Meinhardt   Procedure: Flexible bronchoscopy and EBUS Bronchoscopy  Indication(s) Mediastinal Lymphadenopathy  Consent Risks of the procedure as well as the alternatives and risks of each were explained to the patient and/or caregiver.  Consent for the procedure was obtained.  Anesthesia General Anesthesia   Time Out Verified patient identification, verified procedure, site/side was marked, verified correct patient position, special equipment/implants available, medications/allergies/relevant history reviewed, required imaging and test results available.   Sterile Technique Usual hand hygiene, masks, gowns, and gloves were used   Procedure Description Diagnostic bronchoscope advanced through endotracheal tube and into airway.  Airways were examined down to subsegmental level with findings noted below.  Following diagnostic evaluation, carina appeared sharp, mucosa intact in RLL, RML , RUL, LUL an LLL.  The diagnostic bronchoscope was then removed and the EBUS bronchoscope was advanced into airway with stations 4R, 7 and 11Ri biopsied and sent for slide and cell block.  The EBUS bronchoscope was removed after assuring no active bleeding from biopsy site.  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 Station 7 was biopsied and 11Ri was biopsied.   Complications/Tolerance None; patient tolerated the procedure well. Chest X-ray is needed post procedure.   EBL Minimal   Specimen(s) Cellblock - 4R, 7 and 11Ri.   Janann Colonel, MD [facility] 05/03/2023 1:57 PM

## 2023-05-03 NOTE — Anesthesia Procedure Notes (Signed)
Procedure Name: Intubation Date/Time: 05/03/2023 1:13 PM  Performed by: Pincus Large, CRNAPre-anesthesia Checklist: Patient identified, Emergency Drugs available, Suction available and Patient being monitored Patient Re-evaluated:Patient Re-evaluated prior to induction Oxygen Delivery Method: Circle System Utilized Preoxygenation: Pre-oxygenation with 100% oxygen Induction Type: IV induction Ventilation: Mask ventilation without difficulty Laryngoscope Size: 4 Grade View: Grade II Tube type: Oral Tube size: 8.5 mm Number of attempts: 1 Airway Equipment and Method: Stylet and Oral airway Placement Confirmation: ETT inserted through vocal cords under direct vision, positive ETCO2 and breath sounds checked- equal and bilateral Secured at: 23 cm Tube secured with: Tape Dental Injury: Teeth and Oropharynx as per pre-operative assessment

## 2023-05-03 NOTE — Interval H&P Note (Signed)
Patient with no complaints. Confirmed no anticoagulation.    GEN: NAD  Lungs CBAE  CVS Normal S1, Normal S2, RRR  GI: soft, nontender  Extremities Warm and well perfused   Labs and imaging were reviewed.   Here for EBUS TBNA - Appropriate for procedure.

## 2023-05-03 NOTE — Transfer of Care (Signed)
Immediate Anesthesia Transfer of Care Note  Patient: Matthew Woodard  Procedure(s) Performed: VIDEO BRONCHOSCOPY WITH ENDOBRONCHIAL ULTRASOUND BRONCHIAL NEEDLE ASPIRATION BIOPSIES  Patient Location: PACU  Anesthesia Type:General  Level of Consciousness: awake, alert , and oriented  Airway & Oxygen Therapy: Patient Spontanous Breathing and Patient connected to face mask oxygen  Post-op Assessment: Report given to RN and Post -op Vital signs reviewed and stable  Post vital signs: Reviewed and stable  Last Vitals:  Vitals Value Taken Time  BP 120/66 05/03/23 1355  Temp 97.3   Pulse 104 05/03/23 1357  Resp 19 05/03/23 1357  SpO2 98 % 05/03/23 1357  Vitals shown include unfiled device data.  Last Pain:  Vitals:   05/03/23 1042  TempSrc:   PainSc: 0-No pain         Complications: No notable events documented.

## 2023-05-04 ENCOUNTER — Encounter: Payer: Self-pay | Admitting: Internal Medicine

## 2023-05-04 ENCOUNTER — Encounter (HOSPITAL_COMMUNITY): Payer: Self-pay | Admitting: Pulmonary Disease

## 2023-05-06 NOTE — Anesthesia Postprocedure Evaluation (Signed)
Anesthesia Post Note  Patient: Matthew Woodard  Procedure(s) Performed: VIDEO BRONCHOSCOPY WITH ENDOBRONCHIAL ULTRASOUND BRONCHIAL NEEDLE ASPIRATION BIOPSIES     Patient location during evaluation: PACU Anesthesia Type: General Level of consciousness: awake and alert Pain management: pain level controlled Vital Signs Assessment: post-procedure vital signs reviewed and stable Respiratory status: spontaneous breathing, nonlabored ventilation and respiratory function stable Cardiovascular status: blood pressure returned to baseline and stable Postop Assessment: no apparent nausea or vomiting Anesthetic complications: no   No notable events documented.  Last Vitals:  Vitals:   05/03/23 1500 05/03/23 1515  BP: 131/71 133/73  Pulse: 93 97  Resp: 19 17  Temp:  36.5 C  SpO2: 95% 95%    Last Pain:  Vitals:   05/03/23 1515  TempSrc:   PainSc: 0-No pain                 Bryton Romagnoli

## 2023-05-06 NOTE — Anesthesia Preprocedure Evaluation (Signed)
Anesthesia Evaluation  Patient identified by MRN, date of birth, ID band Patient awake    Reviewed: Allergy & Precautions, NPO status , Patient's Chart, lab work & pertinent test results  History of Anesthesia Complications Negative for: history of anesthetic complications  Airway Mallampati: III  TM Distance: >3 FB Neck ROM: Full    Dental  (+) Dental Advisory Given   Pulmonary COPD, Patient abstained from smoking., former smoker   breath sounds clear to auscultation       Cardiovascular hypertension, Pt. on medications (-) angina (-) Past MI  Rhythm:Regular     Neuro/Psych  Headaches    GI/Hepatic ,GERD  Medicated,,  Endo/Other  diabetes    Renal/GU Renal diseaseClear cell  Lab Results      Component                Value               Date                      NA                       137                 05/03/2023                K                        4.1                 05/03/2023                CO2                      22                  05/03/2023                GLUCOSE                  116 (H)             05/03/2023                BUN                      18                  05/03/2023                CREATININE               0.83                05/03/2023                CALCIUM                  9.2                 05/03/2023                GFRNONAA                 >60                 05/03/2023  Musculoskeletal  (+) Arthritis ,    Abdominal   Peds  Hematology negative hematology ROS (+) Lab Results      Component                Value               Date                      WBC                      7.3                 05/03/2023                HGB                      14.4                05/03/2023                HCT                      43.8                05/03/2023                MCV                      90.5                05/03/2023                PLT                      232                  05/03/2023              Anesthesia Other Findings   Reproductive/Obstetrics                              Anesthesia Physical Anesthesia Plan  ASA: 3  Anesthesia Plan: General   Post-op Pain Management: Minimal or no pain anticipated   Induction: Intravenous  PONV Risk Score and Plan: 2 and Ondansetron, Dexamethasone and Propofol infusion  Airway Management Planned: Oral ETT  Additional Equipment: None  Intra-op Plan:   Post-operative Plan: Extubation in OR  Informed Consent: I have reviewed the patients History and Physical, chart, labs and discussed the procedure including the risks, benefits and alternatives for the proposed anesthesia with the patient or authorized representative who has indicated his/her understanding and acceptance.     Dental advisory given  Plan Discussed with: CRNA  Anesthesia Plan Comments:          Anesthesia Quick Evaluation

## 2023-05-08 LAB — CYTOLOGY - NON PAP

## 2023-05-17 ENCOUNTER — Encounter: Payer: Self-pay | Admitting: Oncology

## 2023-05-17 ENCOUNTER — Inpatient Hospital Stay: Payer: 59 | Attending: Oncology | Admitting: Oncology

## 2023-05-17 VITALS — BP 152/94 | HR 84 | Temp 97.5°F

## 2023-05-17 DIAGNOSIS — Z87891 Personal history of nicotine dependence: Secondary | ICD-10-CM | POA: Diagnosis not present

## 2023-05-17 DIAGNOSIS — C7800 Secondary malignant neoplasm of unspecified lung: Secondary | ICD-10-CM | POA: Insufficient documentation

## 2023-05-17 DIAGNOSIS — C7911 Secondary malignant neoplasm of bladder: Secondary | ICD-10-CM | POA: Diagnosis not present

## 2023-05-17 DIAGNOSIS — Z905 Acquired absence of kidney: Secondary | ICD-10-CM | POA: Diagnosis not present

## 2023-05-17 DIAGNOSIS — K521 Toxic gastroenteritis and colitis: Secondary | ICD-10-CM | POA: Insufficient documentation

## 2023-05-17 DIAGNOSIS — Z7189 Other specified counseling: Secondary | ICD-10-CM | POA: Diagnosis not present

## 2023-05-17 DIAGNOSIS — Z5111 Encounter for antineoplastic chemotherapy: Secondary | ICD-10-CM | POA: Diagnosis not present

## 2023-05-17 DIAGNOSIS — C641 Malignant neoplasm of right kidney, except renal pelvis: Secondary | ICD-10-CM | POA: Diagnosis present

## 2023-05-17 MED ORDER — CABOZANTINIB S-MALATE 60 MG PO TABS: 60.0000 mg | ORAL_TABLET | Freq: Every day | ORAL | Status: DC

## 2023-05-17 NOTE — Progress Notes (Signed)
 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  Recent CT showed thoracic lymphadenopathy which were biopsy proven recurrent metastatic RCC Recommend second line treatment with carbozantinib.  Rationale and side effects were reviewed with patient and he agrees.   Encounter for antineoplastic chemotherapy Treatment plan as listed above.   Goals of care, counseling/discussion Discussed with patient. Treatment is with palliative intent.    Orders Placed This Encounter  Procedures   MR Brain W Wo Contrast    Standing Status:   Future    Expected Date:   05/24/2023    Expiration Date:   05/16/2024    If indicated for the ordered procedure, I authorize the administration of contrast media per Radiology protocol:   Yes    What is the patient's sedation requirement?:   No Sedation    Does the patient have a pacemaker or implanted devices?:   No    Use SRS Protocol?:   Yes    Preferred imaging location?:   [facility] (table limit - 550lbs)   Follow up 1-2 weeks after he starts on Cabozantinib .  All questions were answered. The patient knows to call the clinic with any problems, questions or concerns.  Zelphia Cap, MD, PhD [facility] Hematology Oncology 05/17/2023     HISTORY OF PRESENTING ILLNESS:   Matthew Woodard is a  58 y.o.  male presents for follow up for metastatic kidney cancer Oncology History  Metastatic renal cell carcinoma (HCC)  06/2018 Imaging   CT urogram done which showed right 4 cm central enhancing renal mass with invasion into the collecting system. X-ray of chest was performed to see complete staging which showed no concerning findings   06/28/2018 Cancer Diagnosis       06/28/2018 Procedure    patient underwent a cystoscopy, bladder biopsy and a right retrograde pyelogram with intraoperative interpretation, right diagnostic ureteroscopy, right renal pelvis biopsy, right ureteral stent placement.  biopsy showed atypical cell clusters, with extensive crush artifact, nondiagnostic.     07/22/2018 Initial Diagnosis   Patient underwent right radical laparoscopic nephrectomy with biopsy showing 5.3 cm RCC, clear cell type, grade 3, tumor invades renal vein and segmental branches, pelvic calyceal system and perirenal sinus/fat.  Surgical margins negative for tumor.pT3a,Nx Post-operation, patient was on surveillance after surgery.   10/28/2018 Imaging   CT abdomen pelvis with contrast showed minimal fluid and or postoperative changes within the right renal fossa.  No evidence of metastatic disease or recurrence.  Patient has left kidney lesion previously characterized as nonenhancing cyst by multi phasic contrast-enhanced CT.   04/30/2019 Progression   Patient reports an episode of gross hematuria in early December 2020, patient is due for 14-month surveillance CT scan. 04/30/2019 CT abdomen pelvis with contrast showed high attenuation mass at the right uretero vesicle junction, with extension into the bladder.  Bilateral pulmonary nodules, most indicated for metastatic disease.  Patient underwent cystoscopy and transurethral resection of irregular bladder tumor 3 cm.  Mass appeared to emanate from the right ureteral orifice.  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  #NGS: [employer] PD L1 TPS 1%   05/28/2019 Imaging   CT chest w contrast 1. Numerous bilateral pulmonary nodules measuring up to 10 mm diameter. Imaging features  compatible with metastatic disease. 2. Left hilar lymphadenopathy, also highly suspicious for metastatic involvement.    05/28/2019 Imaging   Bone scan showed  No scintigraphic evidence of osseous  metastatic disease.    06/05/2019 - 06/21/2021 Chemotherapy   06/05/19 Axitinib  5mg  BID+ Keytruda   09/2019 off axitinib  and Keytruda  since the beginning of May due to transaminitis. GI work up negative.  Ultrasound liver vascular Doppler is negative #11/24/2019, resumed on Keytruda  every 3 weeks #01/05/2020, axitinib  was resumed 3 mg #01/20/2020, axitinib  was discontinued due to recurrence of transaminitis.  Keytruda  was temporarily held. #03/16/2020-06/21/2021 resumed on Keytruda  every 3 weeks Keytruda  was discontinued due to recurrent immunotherapy induced diarrhea    07/25/2019 Imaging   Brain w wo contrast No mass, hemorrhage, or abnormal enhancement.   09/29/2020 Imaging   CT scan showed no evidence of local recurrence or new/progressive disease in the chest abdomen or pelvis.  Tiny nonspecific lung nodules are unchanged.  No new lung nodules   01/14/2021 Imaging   CT chest abdomen pelvis showed stable exam, no new or progressive metastatic disease within chest abdomen pelvis. Stable lung nodules. Aortic atherosclerosis.    04/15/2021 Imaging   CT chest abdomen pelvis with contrast showed stable examination.  No evidence of recurrent or metastatic carcinoma within the chest abdomen pelvis.     08/02/2021 Imaging   CT chest abdomen pelvis without contrast, stable examination without evidence of recurrence or metastatic disease.  Mild asymmetric diffuse esophageal wall thickening, similar to prior examination.  Likely chronic esophagitis.  Aortic atherosclerosis   10/21/2021 Procedure   colonoscopy showed 3 mm polyp which was resected and retrieved.  Negative for dysplasia/malignancy.  Random biopsy showed lymphocytic colitis. EGD showed long segment Barrett's esophagus.  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  Negative for dysplasia and malignancy.  Small bowel cold biopsy showed reactive duodenitis.  Negative for CMV.  Negative for dysplasia or  malignancy.   11/16/2021 Imaging   CT chest abdomen pelvis without contrast showed stable examination without evidence of local recurrence or active malignant disease in the chest abdomen pelvis.  Reflux retained contrast in the esophagus suggestive of esophagitis.  Aortic atherosclerosis   07/03/2022 Imaging   CT chest abdomen pelvis w contrast   1. Stable examination status post right nephrectomy without evidence of new or progressive disease in the chest, abdomen or pelvis. 2. Stable scattered tiny pulmonary nodules favored benign etiology. 3.  Aortic Atherosclerosis    01/04/2023 Imaging   CT chest abdomen pelvis wo contrast showed  1. New clustered nodularity and ground-glass in the peripheral right lower lobe, likely infectious/inflammatory. Additionally there are new enlarged mediastinal lymph nodes which given the above findings are favored reactive but nonspecific. Suggest attention on short-term interval follow-up dedicated chest CT to ensure resolution. 2. Stable scattered tiny pulmonary nodules. 3. No evidence of local recurrence or metastatic disease in the abdomen or pelvis. 4.  Aortic Atherosclerosis   04/09/2023 Imaging   CT chest wo contrast  1. Persistent right paratracheal and subcarinal adenopathy. These are stable to slightly increased in the interval (on the order of 1-2 mm). 2. Previously noted left upper lobe and right upper lobe lung nodules are not significantly changed in the interval. 3. New part solid nodule within the anterior left upper lobe measures 6 mm. The appearance is nonspecific. This may be postinflammatory or infectious in etiology. Consider short-term interval follow-up with repeat CT of the chest in 3 months to ensure resolution. 4.  Stable appearance of focal area of ground-glass attenuation and interstitial prominence within the periphery of the right upper lobe. Favor sequelae of inflammation or infection 5. Coronary artery  calcifications. 6.  Aortic Atherosclerosis    05/03/2023 Relapse/Recurrence   Biopsy via bronchoscopy  A. LYMPH NODE, 4R, FINE NEEDLE ASPIRATION:  - Carcinoma, consistent with renal cell carcinoma  - No definitive lymphoid tissue identified   B. LYMPH NODE, 7, FINE NEEDLE ASPIRATION:  - Carcinoma, consistent with renal cell carcinoma  - Lymphoid tissue identified   C. LYMPH NODE, 11R, FINE NEEDLE ASPIRATION:  - Carcinoma, consistent with renal cell carcinoma  - Lymphoid tissue identified    Clear cell carcinoma of right kidney (HCC)     #History of cutaneous psoriasis, mostly on his right hand,    INTERVAL HISTORY Matthew Woodard is a 58 y.o. male who has above history reviewed by me today presents for follow up visit for management of metastatic RCC. Patient reports doing well. No diarrhea. No new complaints. Accompanied by his wife.  + Chronic cough, especially in the morning.  Review of Systems  Constitutional:  Negative for appetite change, chills, fatigue, fever and unexpected weight change.  HENT:   Negative for hearing loss and voice change.   Eyes:  Negative for eye problems and icterus.  Respiratory:  Positive for cough. Negative for chest tightness and shortness of breath.   Cardiovascular:  Negative for chest pain and leg swelling.  Gastrointestinal:  Negative for abdominal distention, abdominal pain, blood in stool, diarrhea and nausea.  Endocrine: Negative for hot flashes.  Genitourinary:  Negative for difficulty urinating, dysuria and frequency.   Musculoskeletal:        Bilateral knee replacement.   Skin:  Negative for itching and rash.  Neurological:  Negative for extremity weakness, headaches, light-headedness and numbness.  Hematological:  Negative for adenopathy. Does not bruise/bleed easily.  Psychiatric/Behavioral:  Negative for confusion.     MEDICAL HISTORY:  Past Medical History:  Diagnosis Date   Arthritis    hands, left knee   Cancer  (HCC)    COPD (chronic obstructive pulmonary disease) (HCC)    Diabetes mellitus without complication (HCC)    Eczema 01/08/2017   GERD (gastroesophageal reflux disease)    History of kidney cancer    Hyperlipidemia LDL goal <100 11/02/2014   Hypertension    Kidney stone    YEAR H/O HEMATURIA   Psoriasis    Wears dentures    full upper, partial lower.  Has, does not wear    SURGICAL HISTORY: Past Surgical History:  Procedure Laterality Date   BRONCHIAL NEEDLE ASPIRATION BIOPSY  05/03/2023   Procedure: BRONCHIAL NEEDLE ASPIRATION BIOPSIES;  Surgeon: Malka Domino, MD;  Location: [facility];  Service: Pulmonary;;   COLONOSCOPY N/A 10/21/2021   Procedure: COLONOSCOPY;  Surgeon: Jinny Carmine, MD;  Location: [facility];  Service: Endoscopy;  Laterality: N/A;   COLONOSCOPY WITH PROPOFOL  N/A 02/23/2017   Procedure: COLONOSCOPY WITH PROPOFOL ;  Surgeon: Jinny Carmine, MD;  Location: [facility];  Service: Endoscopy;  Laterality: N/A;  Diabetic - oral meds   CYST EXCISION  12/2014   on back   CYSTOSCOPY W/ RETROGRADES Right 06/28/2018   Procedure: CYSTOSCOPY WITH RETROGRADE PYELOGRAM;  Surgeon: Francisca Redell BROCKS, MD;  Location: [facility] OR[facility];  Service: Urology;  Laterality: Right;   CYSTOSCOPY W/ URETERAL STENT REMOVAL Right 07/22/2018   Procedure: CYSTOSCOPY WITH STENT REMOVAL;  Surgeon: Francisca Redell BROCKS, MD;  Location: [facility];  Service: Urology;  Laterality: Right;   CYSTOSCOPY WITH BIOPSY Right 06/28/2018   Procedure: CYSTOSCOPY WITH RENAL BIOPSY;  Surgeon: Francisca Redell BROCKS, MD;  Location: [facility];  Service: Urology;  Laterality: Right;   CYSTOSCOPY WITH URETEROSCOPY AND STENT PLACEMENT Right 06/28/2018   Procedure: CYSTOSCOPY WITH URETEROSCOPY AND STENT PLACEMENT;  Surgeon: Francisca Redell BROCKS, MD;  Location: [facility] OR[facility];  Service: Urology;  Laterality: Right;   ESOPHAGOGASTRODUODENOSCOPY N/A 10/21/2021   Procedure: ESOPHAGOGASTRODUODENOSCOPY (EGD);  Surgeon: Jinny Carmine, MD;  Location[facility];  Service: Endoscopy;  Laterality: N/A;   FULGURATION OF BLADDER TUMOR N/A 06/28/2018   Procedure: CYSTOSCOPY BLADDER BIOPSY, FULGERATION OF BLADDER;  Surgeon: Francisca Redell BROCKS, MD;  Location: [facility];  Service: Urology;  Laterality: N/A;   JOINT REPLACEMENT Left    KNEE SURGERY Left    LAPAROSCOPIC NEPHRECTOMY, HAND ASSISTED Right 07/22/2018   Procedure: HAND ASSISTED LAPAROSCOPIC NEPHRECTOMY;  Surgeon: Francisca Redell BROCKS, MD;  Location: [facility];  Service: Urology;  Laterality: Right;   NASAL SINUS SURGERY  07/2013   POLYPECTOMY N/A 02/23/2017   Procedure: POLYPECTOMY;  Surgeon: Jinny Carmine, MD;  Location: [facility];  Service: Endoscopy;  Laterality: N/A;   POLYPECTOMY  10/21/2021   Procedure: POLYPECTOMY;  Surgeon: Jinny Carmine, MD;  Location: [facility];  Service: Endoscopy;;   REPLACEMENT TOTAL KNEE Right    tooth abstraction     TRANSURETHRAL RESECTION OF BLADDER TUMOR N/A 05/12/2019   Procedure: TRANSURETHRAL RESECTION OF BLADDER TUMOR (TURBT);  Surgeon: Francisca Redell BROCKS, MD;  Location: [facility];  Service: Urology;  Laterality: N/A;   VARICOCELE EXCISION     VIDEO BRONCHOSCOPY WITH ENDOBRONCHIAL ULTRASOUND N/A 05/03/2023   Procedure: VIDEO BRONCHOSCOPY WITH ENDOBRONCHIAL ULTRASOUND;  Surgeon: Malka Domino, MD;  Location: [facility];  Service: Pulmonary;  Laterality: N/A;   XI ROBOTIC ASSISTED VENTRAL HERNIA N/A 11/18/2020   Procedure: XI ROBOTIC ASSISTED VENTRAL HERNIA, incisional;  Surgeon: Jordis Laneta FALCON, MD;  Location: [facility];  Service: General;  Laterality: N/A;    SOCIAL HISTORY: Social History   Socioeconomic History   Marital status: Married    Spouse name: Jinnie   Number of children: Not on file   Years of education: Not on file   Highest education level: Not on file  Occupational History   Not on file  Tobacco Use   Smoking status: Former    Current packs/day: 0.00    Types: Cigarettes     Quit date: 06/15/2013    Years since quitting: 9.9   Smokeless tobacco: Never  Vaping Use   Vaping status: Never Used  Substance and Sexual Activity   Alcohol use: No    Alcohol/week: 0.0 standard drinks of alcohol   Drug use: Yes    Types: Marijuana   Sexual activity: Yes  Other Topics Concern   Not on file  Social History Narrative   Lives with wife, sister in law   Social Drivers of Health   Financial Resource Strain: Low Risk  (09/13/2021)   Received from [facility], [facility]   Overall Financial Resource Strain (CARDIA)    Difficulty of Paying Living Expenses: Not hard at all  Food Insecurity: No Food Insecurity (09/13/2021)   Received from [facility], [facility]   Hunger Vital Sign    Worried About Running Out of Food in the Last Year: Never true    Ran Out of Food in the Last Year: Never true  Transportation Needs: No  Transportation Needs (09/13/2021)   Received from [facility], [facility]   [facility] - Transportation    Lack of Transportation (Medical): No    Lack of Transportation (Non-Medical): No  Physical Activity: Not on file  Stress: Not on file  Social Connections: Not on file  Intimate Partner Violence: Not on file    FAMILY HISTORY: Family History  Problem Relation Age of Onset   Heart disease Father    Hypertension Father    COPD Father    Colon cancer Maternal Grandmother    Heart attack Paternal Grandfather     ALLERGIES:  is allergic to axitinib , propofol , and glucotrol [glipizide].  MEDICATIONS:  Current Outpatient Medications  Medication Sig Dispense Refill   albuterol  (VENTOLIN  HFA) 108 (90 Base) MCG/ACT inhaler Inhale 2 puffs into the lungs every 6 (six) hours as needed for wheezing or shortness of breath. 8 g 2   ALPRAZolam  (XANAX ) 0.5 MG tablet Take 1 tablet (0.5 mg total) by mouth 2 (two) times daily as needed for anxiety or sleep. 60 tablet 0   aspirin 81 MG EC tablet Take 81 mg by mouth daily with supper.      clobetasol  ointment (TEMOVATE ) 0.05 % Apply 1 Application topically 2 (two) times daily as needed. 30 g 0   empagliflozin  (JARDIANCE ) 25 MG TABS tablet Take 1 tablet (25 mg total) by mouth daily before breakfast. 90 tablet 1   fluticasone  (FLONASE ) 50 MCG/ACT nasal spray Place 2 sprays into both nostrils daily. 16 g 1   Glucose Blood (ACCU-CHEK AVIVA PLUS VI) by In Vitro route.     glucose blood test strip Check tid, one touch verio 100 each 12   lisinopril  (ZESTRIL ) 10 MG tablet Take 1 tablet (10 mg total) by mouth daily. 90 tablet 1   metFORMIN  (GLUCOPHAGE -XR) 500 MG 24 hr tablet Take 2 tablets (1,000 mg total) by mouth 2 (two) times daily with a meal. 180 tablet 3   Multiple Vitamin (MULTIVITAMIN) tablet Take 1 tablet by mouth daily with supper.     omeprazole  (PRILOSEC) 20 MG capsule Take 1 capsule (20 mg total) by mouth daily as needed (acid reflux). 90 capsule 0   rosuvastatin  (CRESTOR ) 20 MG tablet Take 1 tablet (20 mg total) by mouth daily with supper. 90 tablet 1   Semaglutide , 1 MG/DOSE, 4 MG/3ML SOPN Inject 1 mg as directed once a week. 3 mL 2   umeclidinium-vilanterol (ANORO ELLIPTA ) 62.5-25 MCG/ACT AEPB Inhale 1 puff into the lungs daily. 30 each 3   No current facility-administered medications for this visit.     PHYSICAL EXAMINATION: ECOG PERFORMANCE STATUS: 1 - Symptomatic but completely ambulatory Vitals:   05/17/23 1112  BP: (!) 152/94  Pulse: 84  Temp: (!) 97.5 F (36.4 C)  SpO2: 99%   There were no vitals filed for this visit.   Physical Exam Constitutional:      General: He is not in acute distress. HENT:     Head: Normocephalic and atraumatic.  Eyes:     General: No scleral icterus.    Pupils: Pupils are equal, round, and reactive to light.  Cardiovascular:     Rate and Rhythm: Normal rate and regular rhythm.     Heart sounds: Normal heart sounds.  Pulmonary:     Effort: Pulmonary effort is normal. No respiratory distress.     Breath sounds: No  wheezing.  Abdominal:     General: Bowel sounds are normal. There is no distension.  Palpations: Abdomen is soft. There is no mass.     Tenderness: There is no abdominal tenderness.  Musculoskeletal:        General: No deformity. Normal range of motion.     Cervical back: Normal range of motion and neck supple.     Comments: Bilateral knee replacement.   Skin:    General: Skin is warm and dry.     Findings: No erythema or rash.  Neurological:     Mental Status: He is alert and oriented to person, place, and time. Mental status is at baseline.     Cranial Nerves: No cranial nerve deficit.     Coordination: Coordination normal.  Psychiatric:        Mood and Affect: Mood normal.     LABORATORY DATA:  I have reviewed the data as listed    Latest Ref Rng & Units 05/03/2023   10:54 AM 01/11/2023    2:04 PM 07/06/2022   10:29 AM  CBC  WBC 4.0 - 10.5 K/uL 7.3  7.4  7.6   Hemoglobin 13.0 - 17.0 g/dL 85.5  85.7  84.8   Hematocrit 39.0 - 52.0 % 43.8  42.5  45.7   Platelets 150 - 400 K/uL 232  217  235       Latest Ref Rng & Units 05/03/2023   10:54 AM 01/11/2023    2:04 PM 07/06/2022   10:29 AM  CMP  Glucose 70 - 99 mg/dL 883  837  891   BUN 6 - 20 mg/dL 18  22  19    Creatinine 0.61 - 1.24 mg/dL 9.16  8.74  8.75   Sodium 135 - 145 mmol/L 137  136  136   Potassium 3.5 - 5.1 mmol/L 4.1  4.2  4.5   Chloride 98 - 111 mmol/L 106  102  101   CO2 22 - 32 mmol/L 22  25  26    Calcium  8.9 - 10.3 mg/dL 9.2  9.4  9.4   Total Protein 6.5 - 8.1 g/dL 6.5  7.1  7.5   Total Bilirubin <1.2 mg/dL 0.7  0.4  0.7   Alkaline Phos 38 - 126 U/L 68  67  74   AST 15 - 41 U/L 16  18  20    ALT 0 - 44 U/L 15  15  14      RADIOGRAPHIC STUDIES: I have personally reviewed the radiological images as listed and agreed with the findings in the report.  DG Chest Port 1 View Result Date: 05/03/2023 CLINICAL DATA:  [id number] S/P bronchoscopy [id number] EXAM: PORTABLE CHEST 1 VIEW COMPARISON:  April 30, 2019,  April 09, 2023 FINDINGS: The cardiomediastinal silhouette is unchanged in contour. No pleural effusion. No significant pneumothorax. No acute pleuroparenchymal abnormality. IMPRESSION: No significant pneumothorax. Electronically Signed   By: Corean Salter M.D.   On: 05/03/2023 17:08   CT Chest Wo Contrast Result Date: 04/22/2023 CLINICAL DATA:  Renal cell carcinoma.  Restaging. * Tracking Code: BO * EXAM: CT CHEST WITHOUT CONTRAST TECHNIQUE: Multidetector CT imaging of the chest was performed following the standard protocol without IV contrast. RADIATION DOSE REDUCTION: This exam was performed according to the departmental dose-optimization program which includes automated exposure control, adjustment of the mA and/or kV according to patient size and/or use of iterative reconstruction technique. COMPARISON:  01/04/2023 FINDINGS: Cardiovascular: The heart size appears normal. Aortic atherosclerosis. Coronary artery calcifications. No pericardial effusion. Mediastinum/Nodes: Thyroid  gland, trachea, and esophagus appear normal. Right paratracheal lymph node measures 1.6  cm, image 63/2. Previously 1.5 cm. Subcarinal lymph node measures 1.7 cm, image 77/2. Previously 1.5 cm. Hilar lymph nodes are suboptimally evaluated due to lack of IV contrast. Lungs/Pleura: No pleural effusion, airspace consolidation, atelectasis, or pneumothorax. Scattered lung nodules are again noted. -Index nodule in the right apex measures 4 mm, image 33/3. Formally 3 mm. -Left upper lobe lung nodule measures 4 mm, image 49/3. Unchanged from previous exam. -New part solid nodule within the anterior left upper lobe measures 6 mm, image 50/3. Unchanged focal area of ground-glass attenuation and interstitial prominence within the periphery of the right upper lobe is identified, image 77/3. Upper Abdomen: No acute abnormality. Status post right nephrectomy. Unchanged appearance 1.8 cm hyperdense lesion measuring 83 Hounsfield units in the  posterior left kidney, image 176/2. Previously characterized as a hemorrhagic cyst. Musculoskeletal: No chest wall mass or suspicious bone lesions identified. IMPRESSION: 1. Persistent right paratracheal and subcarinal adenopathy. These are stable to slightly increased in the interval (on the order of 1-2 mm). 2. Previously noted left upper lobe and right upper lobe lung nodules are not significantly changed in the interval. 3. New part solid nodule within the anterior left upper lobe measures 6 mm. The appearance is nonspecific. This may be postinflammatory or infectious in etiology. Consider short-term interval follow-up with repeat CT of the chest in 3 months to ensure resolution. 4. Stable appearance of focal area of ground-glass attenuation and interstitial prominence within the periphery of the right upper lobe. Favor sequelae of inflammation or infection 5. Coronary artery calcifications. 6.  Aortic Atherosclerosis (ICD10-I70.0). Electronically Signed   By: Waddell Calk M.D.   On: 04/22/2023 13:20

## 2023-05-17 NOTE — Assessment & Plan Note (Signed)
Discussed with patient.  Treatment is with palliative intent. 

## 2023-05-17 NOTE — Assessment & Plan Note (Signed)
 Treatment plan as listed above.

## 2023-05-17 NOTE — Assessment & Plan Note (Signed)
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  Recent CT showed thoracic lymphadenopathy which were biopsy proven recurrent metastatic RCC Recommend second line treatment with carbozantinib.  Rationale and side effects were reviewed with patient and he agrees.

## 2023-05-17 NOTE — Addendum Note (Signed)
 Addended by: Rickard Patience on: 05/17/2023 06:26 PM   Modules accepted: Orders

## 2023-05-18 ENCOUNTER — Telehealth: Payer: Self-pay | Admitting: Pharmacist

## 2023-05-18 ENCOUNTER — Other Ambulatory Visit (HOSPITAL_COMMUNITY): Payer: Self-pay

## 2023-05-18 DIAGNOSIS — C641 Malignant neoplasm of right kidney, except renal pelvis: Secondary | ICD-10-CM

## 2023-05-18 MED ORDER — CABOZANTINIB S-MALATE 60 MG PO TABS: 60.0000 mg | ORAL_TABLET | Freq: Every day | ORAL | 0 refills | Status: DC

## 2023-05-18 NOTE — Telephone Encounter (Signed)
 Clinical Pharmacist Practitioner Encounter  New authorization  Received notification from [employer] that prior authorization for Cabometyx  is required.   PA submitted on CMM Key [id number] Status is pending   Oral Oncology Clinic will continue to follow.   Jamaiyah Pyle [place], PharmD, BCPS, [facility] Hematology/Oncology Clinical Pharmacist [place]/[facility] [phone]  05/18/2023 9:29 AM

## 2023-05-18 NOTE — Telephone Encounter (Signed)
 Clinical Pharmacist Practitioner Encounter   New authorization  Prior Authorization for Cabometyx  has been approved.     Effective dates: 05/18/23 through 05/17/24  Patient required to fill at [facility]   Oral Oncology Clinic will continue to follow.   Jagger Beahm N. Jeanann Balinski, PharmD, BCPS. [facility] Hematology/Oncology Clinical Pharmacist [facility] [phone]  05/18/2023 9:37 AM

## 2023-05-18 NOTE — Telephone Encounter (Signed)
 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  CMP from 05/03/23 assessed, no relevant lab abnormalities. Prescription dose and frequency assessed.   Current medication list in Epic reviewed, no DDIs with cabozantinib  identified.  Evaluated chart and no patient barriers to medication adherence identified.   Prescription has been e-scribed to the [facility] for benefits analysis and approval.  Oral Oncology Clinic will continue to follow for insurance authorization, copayment issues, initial counseling and start date.  Patient agreed to treatment on 05/17/23 per MD documentation.  Matthew Woodard, PharmD, BCPS, BCOP, CPP Hematology/Oncology Clinical Pharmacist Practitioner [place] [phone]  05/18/2023 9:09 AM

## 2023-05-21 MED ORDER — PROCHLORPERAZINE MALEATE 10 MG PO TABS: 10.0000 mg | ORAL_TABLET | Freq: Four times a day (QID) | ORAL | 1 refills | Status: AC | PRN

## 2023-05-21 MED ORDER — ONDANSETRON HCL 8 MG PO TABS: 8.0000 mg | ORAL_TABLET | Freq: Three times a day (TID) | ORAL | 1 refills | Status: AC | PRN

## 2023-05-21 NOTE — Telephone Encounter (Signed)
 Patient reported on 05/21/23 that [facility] is processing his prescription (pt has used the pharmacy before). He knows to follow-up with [facility] later this week he has has heard from them.   Patient will let me know when his medication is scheduled to be delivered so his clinic follow-up can be scheduled.

## 2023-05-21 NOTE — Telephone Encounter (Signed)
 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   Counseled patient on administration, dosing, side effects, monitoring, drug-food interactions, safe handling, storage, and disposal. Patient will take 1 tablet (60 mg total) by mouth daily. Take on an empty stomach, 1 hour before or 2 hours after meals.   Side effects include but not limited to: diarrhea, fatigue, nausea, hand-foot syndrome, mouth sores, decreased wbc/hgb/plt, hypertension, fatigue, N/V Diarrhea: Patient knows to use loperamide  as needed and call the office if they are having 4 or more loose stools per day. Hand-foot syndrome: Recommended the use of [place] Smooth Extra Care 20. Patient knows to report any skin changes they notice.  Mouth sores: Patient knows to request magic mouthwash if needed  Reviewed with patient importance of keeping a medication schedule and plan for any missed doses.  After discussion with patient no patient barriers to medication adherence identified.   Matthew Woodard voiced understanding and appreciation. All questions answered. Medication handout provided.  Provided patient with Oral Chemotherapy Navigation Clinic phone number. Patient knows to call the office with questions or concerns. Oral Chemotherapy Navigation Clinic will continue to follow.  Matthew Woodard N. Daley Gosse, PharmD, BCPS, BCOP, CPP Hematology/Oncology Clinical Pharmacist Practitioner [place] [phone]  05/21/2023 1:10 PM

## 2023-05-22 ENCOUNTER — Encounter: Payer: Self-pay | Admitting: Oncology

## 2023-05-23 ENCOUNTER — Inpatient Hospital Stay: Payer: 59 | Admitting: Licensed Clinical Social Worker

## 2023-05-23 ENCOUNTER — Telehealth: Payer: Self-pay

## 2023-05-23 DIAGNOSIS — C641 Malignant neoplasm of right kidney, except renal pelvis: Secondary | ICD-10-CM

## 2023-05-23 NOTE — Telephone Encounter (Signed)
 Pt will start medication on 1/16. Please schedule Lab/MD (cbc,cmp) 1- 2 weeks after 1/16 and notify pt.

## 2023-05-23 NOTE — Progress Notes (Signed)
 [facility] CSW Progress Note  Clinical Child Psychotherapist returned patient's call.  He stated he wished to complete his Advance Directives.  Appointment is scheduled for Thursday, January 23rd at 10am.  Patient expressed no other needs.    [name], LCSW Clinical Social Worker [facility]

## 2023-05-23 NOTE — Telephone Encounter (Signed)
-----   Message from Alyson N Leonard sent at 05/23/2023  9:06 AM EST ----- RICK, so follow can be scheduled, patient will start his cabozantinib  on 05/31/23  -Alyson ----- Message ----- From: Babara Call, MD Sent: 05/17/2023   6:28 PM EST To: Almarie JINNY Nett, RN; Fonda KANDICE Sax, CMA; #  Alyson and Ben,  I plan to start patient on cabozantinib  60mg  daily.  Please check coverage and arrange supply to be delivered. Ok to start once he gets supply.   I plan to see him back 1-2 weeks after starting med. Lab MD cbc cmp   Thank you  Call

## 2023-05-23 NOTE — Telephone Encounter (Signed)
 Per patient message:  Spoke [facility] today and my prescription should arrive by Wednesday Jan 15. Will most likely start medication the following day.   Morene Potters, CPhT Oncology Pharmacy Patient Advocate  [facility]  [phone] (phone) [phone] (fax) 05/23/2023 8:41 AM

## 2023-05-24 ENCOUNTER — Ambulatory Visit
Admission: RE | Admit: 2023-05-24 | Discharge: 2023-05-24 | Disposition: A | Discharge status: Home / Self Care | Payer: 59 | Source: Ambulatory Visit | Attending: Oncology | Admitting: Oncology

## 2023-05-24 DIAGNOSIS — C641 Malignant neoplasm of right kidney, except renal pelvis: Secondary | ICD-10-CM

## 2023-05-24 MED ORDER — GADOBUTROL 1 MMOL/ML IV SOLN
9.0000 mL | Freq: Once | INTRAVENOUS | Status: AC | PRN
  Administered 2023-05-24: 9 mL via INTRAVENOUS

## 2023-05-29 ENCOUNTER — Telehealth: Payer: Self-pay | Admitting: *Deleted

## 2023-05-29 ENCOUNTER — Other Ambulatory Visit: Payer: Self-pay | Admitting: Internal Medicine

## 2023-05-29 DIAGNOSIS — K219 Gastro-esophageal reflux disease without esophagitis: Secondary | ICD-10-CM

## 2023-05-29 NOTE — Telephone Encounter (Signed)
 Form signed and faxed to [employer] per patient request and copied for chart and for patient to have copy will be placed at front desk for him to pick up when he returns in 2 weeks. Patient informed

## 2023-05-29 NOTE — Telephone Encounter (Signed)
 Received Critical Illness form for patient , form completed and sent for doctor signature

## 2023-05-30 NOTE — Telephone Encounter (Signed)
 Requested Prescriptions  Pending Prescriptions Disp Refills   omeprazole  (PRILOSEC) 20 MG capsule [Pharmacy Med Name: OMEPRAZOL RX CAP 20MG ] 90 capsule 0    Sig: TAKE 1 CAPSULE DAILY AS    NEEDED FOR ACID REFLUX     Gastroenterology: Proton Pump Inhibitors Passed - 05/30/2023 12:14 PM      Passed - Valid encounter within last 12 months    Recent Outpatient Visits           4 months ago Type 2 diabetes mellitus with hyperglycemia, without long-term current use of insulin  [facility])   [place] [facility] Rockney Cid, DO   10 months ago Type 2 diabetes mellitus with hyperglycemia, without long-term current use of insulin  [facility])   [facility] Rockney Cid, DO   1 year ago Benign essential HTN   [place] [facility] Rockney Cid, DO   4 years ago Renal mass, right   [facility] [name], Ernestine Heads, MD   4 years ago Hematuria, unspecified type   [facility] [name], Ernestine Heads, MD       Future Appointments             In 1 month Rockney Cid, DO [facility] [place]

## 2023-06-06 ENCOUNTER — Other Ambulatory Visit: Payer: Self-pay | Admitting: Oncology

## 2023-06-06 DIAGNOSIS — C641 Malignant neoplasm of right kidney, except renal pelvis: Secondary | ICD-10-CM

## 2023-06-07 ENCOUNTER — Encounter: Payer: Self-pay | Admitting: Oncology

## 2023-06-07 ENCOUNTER — Inpatient Hospital Stay: Payer: 59

## 2023-06-07 NOTE — Progress Notes (Signed)
[facility] Healthcare Advance Directives Clinical Social Work  Patient presented to Advance Directives Clinic  to review and complete healthcare advance directives.  Clinical Social Worker met with patient and his wife, Isabelle Course.  The patient designated Kaikane Bengel as their primary healthcare agent and Aliene Altes as their secondary agent.  Patient also completed healthcare living will.    Documents were notarized and copies made for patient/family. Clinical Social Worker will send documents to medical records to be scanned into patient's chart. Clinical Social Worker encouraged patient/family to contact with any additional questions or concerns.   Dorothey Baseman, LCSW Clinical Social Worker [facility]

## 2023-06-14 ENCOUNTER — Inpatient Hospital Stay: Payer: 59

## 2023-06-14 ENCOUNTER — Inpatient Hospital Stay: Payer: 59 | Admitting: Oncology

## 2023-06-14 ENCOUNTER — Encounter: Payer: Self-pay | Admitting: Oncology

## 2023-06-14 VITALS — BP 135/83 | HR 84 | Temp 97.6°F | Resp 18 | Wt 208.8 lb

## 2023-06-14 DIAGNOSIS — T451X5A Adverse effect of antineoplastic and immunosuppressive drugs, initial encounter: Secondary | ICD-10-CM

## 2023-06-14 DIAGNOSIS — C641 Malignant neoplasm of right kidney, except renal pelvis: Secondary | ICD-10-CM

## 2023-06-14 DIAGNOSIS — Z5111 Encounter for antineoplastic chemotherapy: Secondary | ICD-10-CM

## 2023-06-14 DIAGNOSIS — K521 Toxic gastroenteritis and colitis: Secondary | ICD-10-CM

## 2023-06-14 LAB — CMP (CANCER CENTER ONLY)
ALT: 27 U/L (ref 0–44)
AST: 31 U/L (ref 15–41)
Albumin: 3.9 g/dL (ref 3.5–5.0)
Alkaline Phosphatase: 95 U/L (ref 38–126)
Anion gap: 8 (ref 5–15)
BUN: 19 mg/dL (ref 6–20)
CO2: 25 mmol/L (ref 22–32)
Calcium: 8.9 mg/dL (ref 8.9–10.3)
Chloride: 103 mmol/L (ref 98–111)
Creatinine: 1 mg/dL (ref 0.61–1.24)
GFR, Estimated: >60 mL/min (ref >60)
Glucose, Bld: 118 mg/dL — ABNORMAL HIGH (ref 70–99)
Potassium: 4 mmol/L (ref 3.5–5.1)
Sodium: 136 mmol/L (ref 135–145)
Total Bilirubin: 0.5 mg/dL (ref 0.0–1.2)
Total Protein: 7 g/dL (ref 6.5–8.1)

## 2023-06-14 LAB — LACTATE DEHYDROGENASE: LDH: 161 U/L (ref 98–192)

## 2023-06-14 LAB — CBC WITH DIFFERENTIAL (CANCER CENTER ONLY)
Abs Immature Granulocytes: 0.03 10*3/uL (ref 0.00–0.07)
Basophils Absolute: 0 10*3/uL (ref 0.0–0.1)
Basophils Relative: 1 %
Eosinophils Absolute: 0.5 10*3/uL (ref 0.0–0.5)
Eosinophils Relative: 7 %
HCT: 45.1 % (ref 39.0–52.0)
Hemoglobin: 15.3 g/dL (ref 13.0–17.0)
Immature Granulocytes: 0 %
Lymphocytes Relative: 36 %
Lymphs Abs: 2.6 10*3/uL (ref 0.7–4.0)
MCH: 29.2 pg (ref 26.0–34.0)
MCHC: 33.9 g/dL (ref 30.0–36.0)
MCV: 86.1 fL (ref 80.0–100.0)
Monocytes Absolute: 0.4 10*3/uL (ref 0.1–1.0)
Monocytes Relative: 5 %
Neutro Abs: 3.8 10*3/uL (ref 1.7–7.7)
Neutrophils Relative %: 51 %
Platelet Count: 209 10*3/uL (ref 150–400)
RBC: 5.24 MIL/uL (ref 4.22–5.81)
RDW: 12.4 % (ref 11.5–15.5)
WBC Count: 7.3 10*3/uL (ref 4.0–10.5)
nRBC: 0 % (ref 0.0–0.2)

## 2023-06-14 MED ORDER — LOPERAMIDE HCL 2 MG PO CAPS: 2.0000 mg | ORAL_CAPSULE | ORAL | 2 refills | Status: DC

## 2023-06-14 NOTE — Progress Notes (Signed)
Pt here for follow up. Pt reports that BP has been running high.

## 2023-06-14 NOTE — Progress Notes (Signed)
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  Recent CT showed thoracic lymphadenopathy which were biopsy proven recurrent metastatic RCC On 2nd line treatment with cabozantinib 60mg  daily,.  So far he tolerates well with mild diarrhea.  Continue    Encounter for antineoplastic chemotherapy Treatment plan as listed above.   Chemotherapy induced diarrhea Recommend imodium PRN as directed.    Orders Placed This Encounter  Procedures   CMP (Cancer Center only)    Standing Status:   Future    Expected Date:   06/28/2023    Expiration Date:   06/13/2024   CBC with Differential (Cancer Center Only)    Standing Status:   Future    Expected Date:   06/28/2023    Expiration Date:   06/13/2024   Follow up 2 weeks .  All questions were answered. The patient knows to call the clinic with any problems, questions or concerns.  Rickard Patience, MD, PhD [facility] Hematology Oncology 06/14/2023     HISTORY OF PRESENTING ILLNESS:   Matthew Woodard is a  58 y.o.  male presents for follow up for metastatic kidney cancer Oncology History  Metastatic renal cell carcinoma (HCC)  06/2018 Imaging   CT urogram done which showed right 4 cm central enhancing renal mass with invasion into the collecting system. X-ray of chest was performed to see complete staging which showed no concerning findings   06/28/2018 Cancer Diagnosis      06/28/2018 Procedure    patient underwent a cystoscopy, bladder biopsy and a right retrograde pyelogram with intraoperative interpretation, right diagnostic ureteroscopy, right renal pelvis biopsy, right ureteral stent placement.  biopsy showed atypical cell clusters, with extensive crush  artifact, nondiagnostic.     07/22/2018 Initial Diagnosis   Patient underwent right radical laparoscopic nephrectomy with biopsy showing 5.3 cm RCC, clear cell type, grade 3, tumor invades renal vein and segmental branches, pelvic calyceal system and perirenal sinus/fat.  Surgical margins negative for tumor.pT3a,Nx Post-operation, patient was on surveillance after surgery.   10/28/2018 Imaging   CT abdomen pelvis with contrast showed minimal fluid and or postoperative changes within the right renal fossa.  No evidence of metastatic disease or recurrence.  Patient has left kidney lesion previously characterized as nonenhancing cyst by multi phasic contrast-enhanced CT.   04/30/2019 Progression   Patient reports an episode of gross hematuria in early December 2020, patient is due for 47-month surveillance CT scan. 04/30/2019 CT abdomen pelvis with contrast showed high attenuation mass at the right uretero vesicle junction, with extension into the bladder.  Bilateral pulmonary nodules, most indicated for metastatic disease.  Patient underwent cystoscopy and transurethral resection of irregular bladder tumor 3 cm.  Mass appeared to emanate from the right ureteral orifice.  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  #NGS: [employer] PD L1 TPS 1%   05/28/2019 Imaging   CT chest w contrast 1. Numerous bilateral pulmonary nodules measuring up to 10 mm diameter. Imaging features compatible with metastatic disease. 2. Left hilar lymphadenopathy, also highly suspicious for metastatic involvement.    05/28/2019 Imaging   Bone scan showed  No scintigraphic evidence of osseous metastatic disease.    06/05/2019 - 06/21/2021 Chemotherapy   06/05/19 Axitinib 5mg  BID+ [name]  09/2019  off axitinib and Keytruda since the beginning of May due to transaminitis. GI work up negative.  Ultrasound liver vascular Doppler is negative #11/24/2019, resumed on Keytruda every 3  weeks #01/05/2020, axitinib was resumed 3 mg #01/20/2020, axitinib was discontinued due to recurrence of transaminitis.  [name] was temporarily held. #03/16/2020-06/21/2021 resumed on Keytruda every 3 weeks [name] was discontinued due to recurrent immunotherapy induced diarrhea    07/25/2019 Imaging   Brain w wo contrast No mass, hemorrhage, or abnormal enhancement.   09/29/2020 Imaging   CT scan showed no evidence of local recurrence or new/progressive disease in the chest abdomen or pelvis.  Tiny nonspecific lung nodules are unchanged.  No new lung nodules   01/14/2021 Imaging   CT chest abdomen pelvis showed stable exam, no new or progressive metastatic disease within chest abdomen pelvis. Stable lung nodules. Aortic atherosclerosis.    04/15/2021 Imaging   CT chest abdomen pelvis with contrast showed stable examination.  No evidence of recurrent or metastatic carcinoma within the chest abdomen pelvis.     08/02/2021 Imaging   CT chest abdomen pelvis without contrast, stable examination without evidence of recurrence or metastatic disease.  Mild asymmetric diffuse esophageal wall thickening, similar to prior examination.  Likely chronic esophagitis.  Aortic atherosclerosis   10/21/2021 Procedure   colonoscopy showed 3 mm polyp which was resected and retrieved.  Negative for dysplasia/malignancy.  Random biopsy showed lymphocytic colitis. EGD showed long segment Barrett's esophagus.  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  Negative for dysplasia and malignancy.  Small bowel cold biopsy showed reactive duodenitis.  Negative for CMV.  Negative for dysplasia or malignancy.   11/16/2021 Imaging   CT chest abdomen pelvis without contrast showed stable examination without evidence of local recurrence or active malignant disease in the chest abdomen pelvis.  Reflux retained contrast in the esophagus suggestive of esophagitis.  Aortic atherosclerosis   07/03/2022  Imaging   CT chest abdomen pelvis w contrast   1. Stable examination status post right nephrectomy without evidence of new or progressive disease in the chest, abdomen or pelvis. 2. Stable scattered tiny pulmonary nodules favored benign etiology. 3.  Aortic Atherosclerosis    01/04/2023 Imaging   CT chest abdomen pelvis wo contrast showed  1. New clustered nodularity and ground-glass in the peripheral right lower lobe, likely infectious/inflammatory. Additionally there are new enlarged mediastinal lymph nodes which given the above findings are favored reactive but nonspecific. Suggest attention on short-term interval follow-up dedicated chest CT to ensure resolution. 2. Stable scattered tiny pulmonary nodules. 3. No evidence of local recurrence or metastatic disease in the abdomen or pelvis. 4.  Aortic Atherosclerosis   04/09/2023 Imaging   CT chest wo contrast  1. Persistent right paratracheal and subcarinal adenopathy. These are stable to slightly increased in the interval (on the order of 1-2 mm). 2. Previously noted left upper lobe and right upper lobe lung nodules are not significantly changed in the interval. 3. New part solid nodule within the anterior left upper lobe measures 6 mm. The appearance is nonspecific. This may be postinflammatory or infectious in etiology. Consider short-term interval follow-up with repeat CT of the chest in 3 months to ensure resolution. 4. Stable appearance of focal area of ground-glass attenuation and interstitial prominence within the periphery of the right upper lobe. Favor sequelae of inflammation or infection 5. Coronary artery calcifications. 6.  Aortic Atherosclerosis    05/03/2023 Relapse/Recurrence   Biopsy via bronchoscopy  A. LYMPH NODE, 4R,  FINE NEEDLE ASPIRATION:  - Carcinoma, consistent with renal cell carcinoma  - No definitive lymphoid tissue identified   B. LYMPH NODE, 7, FINE NEEDLE ASPIRATION:  - Carcinoma,  consistent with renal cell carcinoma  - Lymphoid tissue identified   C. LYMPH NODE, 11R, FINE NEEDLE ASPIRATION:  - Carcinoma, consistent with renal cell carcinoma  - Lymphoid tissue identified    05/30/2023 -  Chemotherapy   Cabozantinib 60mg  daily   Clear cell carcinoma of right kidney (HCC)     #History of cutaneous psoriasis, mostly on his right hand,    INTERVAL HISTORY Matthew Woodard is a 58 y.o. male who has above history reviewed by me today presents for follow up visit for management of metastatic RCC. Patient reports doing well.Accompanied by his wife.  + Chronic cough, especially in the morning. Started on cabozantinib 60mg  daily 2 weeks ago, so far tolerates well.  Loose stool, 1-2 time per day started past weekend.   Review of Systems  Constitutional:  Negative for appetite change, chills, fatigue, fever and unexpected weight change.  HENT:   Negative for hearing loss and voice change.   Eyes:  Negative for eye problems and icterus.  Respiratory:  Positive for cough. Negative for chest tightness and shortness of breath.   Cardiovascular:  Negative for chest pain and leg swelling.  Gastrointestinal:  Positive for diarrhea. Negative for abdominal distention, abdominal pain, blood in stool and nausea.  Endocrine: Negative for hot flashes.  Genitourinary:  Negative for difficulty urinating, dysuria and frequency.   Musculoskeletal:        Bilateral knee replacement.   Skin:  Negative for itching and rash.  Neurological:  Negative for extremity weakness, headaches, light-headedness and numbness.  Hematological:  Negative for adenopathy. Does not bruise/bleed easily.  Psychiatric/Behavioral:  Negative for confusion.     MEDICAL HISTORY:  Past Medical History:  Diagnosis Date   Arthritis    hands, left knee   Cancer (HCC)    COPD (chronic obstructive pulmonary disease) (HCC)    Diabetes mellitus without complication (HCC)    Eczema 01/08/2017   GERD  (gastroesophageal reflux disease)    History of kidney cancer    Hyperlipidemia LDL goal <100 11/02/2014   Hypertension    Kidney stone    YEAR H/O HEMATURIA   Psoriasis    Wears dentures    full upper, partial lower.  Has, does not wear    SURGICAL HISTORY: Past Surgical History:  Procedure Laterality Date   BRONCHIAL NEEDLE ASPIRATION BIOPSY  05/03/2023   Procedure: BRONCHIAL NEEDLE ASPIRATION BIOPSIES;  Surgeon: Janann Colonel, MD;  Location: [facility];  Service: Pulmonary;;   COLONOSCOPY N/A 10/21/2021   Procedure: COLONOSCOPY;  Surgeon: Midge Minium, MD;  Location: [facility];  Service: Endoscopy;  Laterality: N/A;   COLONOSCOPY WITH PROPOFOL N/A 02/23/2017   Procedure: COLONOSCOPY WITH PROPOFOL;  Surgeon: Midge Minium, MD;  Location: [facility];  Service: Endoscopy;  Laterality: N/A;  Diabetic - oral meds   CYST EXCISION  12/2014   on back   CYSTOSCOPY W/ RETROGRADES Right 06/28/2018   Procedure: CYSTOSCOPY WITH RETROGRADE PYELOGRAM;  Surgeon: Sondra Come, MD;  Location: [facility];  Service: Urology;  Laterality: Right;   CYSTOSCOPY W/ URETERAL STENT REMOVAL Right 07/22/2018   Procedure: CYSTOSCOPY WITH STENT REMOVAL;  Surgeon: Sondra Come, MD;  Location: [facility];  Service: Urology;  Laterality: Right;   CYSTOSCOPY WITH BIOPSY Right 06/28/2018   Procedure: CYSTOSCOPY WITH RENAL  BIOPSY;  Surgeon: Sondra Come, MD;  Location: [facility];  Service: Urology;  Laterality: Right;   CYSTOSCOPY WITH URETEROSCOPY AND STENT PLACEMENT Right 06/28/2018   Procedure: CYSTOSCOPY WITH URETEROSCOPY AND STENT PLACEMENT;  Surgeon: Sondra Come, MD;  Location: [facility];  Service: Urology;  Laterality: Right;   ESOPHAGOGASTRODUODENOSCOPY N/A 10/21/2021   Procedure: ESOPHAGOGASTRODUODENOSCOPY (EGD);  Surgeon: Midge Minium, MD;  Location: [facility];  Service: Endoscopy;  Laterality: N/A;   FULGURATION OF BLADDER TUMOR N/A 06/28/2018   Procedure:  CYSTOSCOPY BLADDER BIOPSY, FULGERATION OF BLADDER;  Surgeon: Sondra Come, MD;  Location: [facility];  Service: Urology;  Laterality: N/A;   JOINT REPLACEMENT Left    KNEE SURGERY Left    LAPAROSCOPIC NEPHRECTOMY, HAND ASSISTED Right 07/22/2018   Procedure: HAND ASSISTED LAPAROSCOPIC NEPHRECTOMY;  Surgeon: Sondra Come, MD;  Location: [facility];  Service: Urology;  Laterality: Right;   NASAL SINUS SURGERY  07/2013   POLYPECTOMY N/A 02/23/2017   Procedure: POLYPECTOMY;  Surgeon: Midge Minium, MD;  Location: [facility];  Service: Endoscopy;  Laterality: N/A;   POLYPECTOMY  10/21/2021   Procedure: POLYPECTOMY;  Surgeon: Midge Minium, MD;  Location: [facility];  Service: Endoscopy;;   REPLACEMENT TOTAL KNEE Right    tooth abstraction     TRANSURETHRAL RESECTION OF BLADDER TUMOR N/A 05/12/2019   Procedure: TRANSURETHRAL RESECTION OF BLADDER TUMOR (TURBT);  Surgeon: Sondra Come, MD;  Location: [facility];  Service: Urology;  Laterality: N/A;   VARICOCELE EXCISION     VIDEO BRONCHOSCOPY WITH ENDOBRONCHIAL ULTRASOUND N/A 05/03/2023   Procedure: VIDEO BRONCHOSCOPY WITH ENDOBRONCHIAL ULTRASOUND;  Surgeon: Janann Colonel, MD;  Location: [facility];  Service: Pulmonary;  Laterality: N/A;   XI ROBOTIC ASSISTED VENTRAL HERNIA N/A 11/18/2020   Procedure: XI ROBOTIC ASSISTED VENTRAL HERNIA, incisional;  Surgeon: Leafy Ro, MD;  Location: [facility];  Service: General;  Laterality: N/A;    SOCIAL HISTORY: Social History   Socioeconomic History   Marital status: Married    Spouse name: Isabelle Course   Number of children: Not on file   Years of education: Not on file   Highest education level: Not on file  Occupational History   Not on file  Tobacco Use   Smoking status: Former    Current packs/day: 0.00    Types: Cigarettes    Quit date: 06/15/2013    Years since quitting: 10.0   Smokeless tobacco: Never  Vaping Use   Vaping status: Never Used  Substance and Sexual  Activity   Alcohol use: No    Alcohol/week: 0.0 standard drinks of alcohol   Drug use: Yes    Types: Marijuana   Sexual activity: Yes  Other Topics Concern   Not on file  Social History Narrative   Lives with wife, sister in law   Social Drivers of Health   Financial Resource Strain: Low Risk  (09/13/2021)   Received [facility], [facility]   Overall Financial Resource Strain (CARDIA)    Difficulty of Paying Living Expenses: Not hard at all  Food Insecurity: No Food Insecurity (09/13/2021)   Received from [facility], [facility]   Hunger Vital Sign    Worried About Running Out of Food in the Last Year: Never true    Ran Out of Food in the Last Year: Never true  Transportation Needs: No Transportation Needs (09/13/2021)   Received from [facility], [facility]   [facility] - Transportation  Lack of Transportation (Medical): No    Lack of Transportation (Non-Medical): No  Physical Activity: Not on file  Stress: Not on file  Social Connections: Not on file  Intimate Partner Violence: Not on file    FAMILY HISTORY: Family History  Problem Relation Age of Onset   Heart disease Father    Hypertension Father    COPD Father    Colon cancer Maternal Grandmother    Heart attack Paternal Grandfather     ALLERGIES:  is allergic to axitinib, propofol, and glucotrol [glipizide].  MEDICATIONS:  Current Outpatient Medications  Medication Sig Dispense Refill   albuterol (VENTOLIN HFA) 108 (90 Base) MCG/ACT inhaler Inhale 2 puffs into the lungs every 6 (six) hours as needed for wheezing or shortness of breath. 8 g 2   ALPRAZolam (XANAX) 0.5 MG tablet Take 1 tablet (0.5 mg total) by mouth 2 (two) times daily as needed for anxiety or sleep. 60 tablet 0   aspirin 81 MG EC tablet Take 81 mg by mouth daily with supper.     cabozantinib (CABOMETYX) 60 MG tablet Take 1 tablet (60 mg total) by mouth daily. Take on an empty stomach, 1 hour before or 2 hours after  meals. 30 tablet 0   clobetasol ointment (TEMOVATE) 0.05 % Apply 1 Application topically 2 (two) times daily as needed. 30 g 0   empagliflozin (JARDIANCE) 25 MG TABS tablet Take 1 tablet (25 mg total) by mouth daily before breakfast. 90 tablet 1   fluticasone (FLONASE) 50 MCG/ACT nasal spray Place 2 sprays into both nostrils daily. 16 g 1   Glucose Blood (ACCU-CHEK AVIVA PLUS VI) by In Vitro route.     glucose blood test strip Check tid, one touch verio 100 each 12   lisinopril (ZESTRIL) 10 MG tablet Take 1 tablet (10 mg total) by mouth daily. 90 tablet 1   loperamide (IMODIUM) 2 MG capsule Take 1 capsule (2 mg total) by mouth See admin instructions. With onset of diarrhea take 4 mg,the 2 mg every 2 hours (4 mg every 4 hours at night)  maximum: 16 mg/day 60 capsule 2   metFORMIN (GLUCOPHAGE-XR) 500 MG 24 hr tablet Take 2 tablets (1,000 mg total) by mouth 2 (two) times daily with a meal. 180 tablet 3   Multiple Vitamin (MULTIVITAMIN) tablet Take 1 tablet by mouth daily with supper.     omeprazole (PRILOSEC) 20 MG capsule TAKE 1 CAPSULE DAILY AS    NEEDED FOR ACID REFLUX 90 capsule 0   ondansetron (ZOFRAN) 8 MG tablet Take 1 tablet (8 mg total) by mouth every 8 (eight) hours as needed for nausea or vomiting. 20 tablet 1   rosuvastatin (CRESTOR) 20 MG tablet Take 1 tablet (20 mg total) by mouth daily with supper. 90 tablet 1   Semaglutide, 1 MG/DOSE, 4 MG/3ML SOPN Inject 1 mg as directed once a week. 3 mL 2   umeclidinium-vilanterol (ANORO ELLIPTA) 62.5-25 MCG/ACT AEPB Inhale 1 puff into the lungs daily. 30 each 3   prochlorperazine (COMPAZINE) 10 MG tablet Take 1 tablet (10 mg total) by mouth every 6 (six) hours as needed. (Patient not taking: Reported on 06/14/2023) 30 tablet 1   No current facility-administered medications for this visit.     PHYSICAL EXAMINATION: ECOG PERFORMANCE STATUS: 1 - Symptomatic but completely ambulatory Vitals:   06/14/23 0911  BP: 135/83  Pulse: 84  Resp: 18   Temp: 97.6 F (36.4 C)  SpO2: 97%   Filed Weights   06/14/23  1610  Weight: 208 lb 12.8 oz (94.7 kg)     Physical Exam Constitutional:      General: He is not in acute distress. HENT:     Head: Normocephalic and atraumatic.  Eyes:     General: No scleral icterus.    Pupils: Pupils are equal, round, and reactive to light.  Cardiovascular:     Rate and Rhythm: Normal rate and regular rhythm.     Heart sounds: Normal heart sounds.  Pulmonary:     Effort: Pulmonary effort is normal. No respiratory distress.     Breath sounds: No wheezing.  Abdominal:     General: Bowel sounds are normal. There is no distension.     Palpations: Abdomen is soft. There is no mass.     Tenderness: There is no abdominal tenderness.  Musculoskeletal:        General: No deformity. Normal range of motion.     Cervical back: Normal range of motion and neck supple.     Comments: Bilateral knee replacement.   Skin:    General: Skin is warm and dry.     Findings: No erythema or rash.  Neurological:     Mental Status: He is alert and oriented to person, place, and time. Mental status is at baseline.     Cranial Nerves: No cranial nerve deficit.     Coordination: Coordination normal.  Psychiatric:        Mood and Affect: Mood normal.     LABORATORY DATA:  I have reviewed the data as listed    Latest Ref Rng & Units 06/14/2023    8:41 AM 05/03/2023   10:54 AM 01/11/2023    2:04 PM  CBC  WBC 4.0 - 10.5 K/uL 7.3  7.3  7.4   Hemoglobin 13.0 - 17.0 g/dL 96.0  45.4  09.8   Hematocrit 39.0 - 52.0 % 45.1  43.8  42.5   Platelets 150 - 400 K/uL 209  232  217       Latest Ref Rng & Units 06/14/2023    8:41 AM 05/03/2023   10:54 AM 01/11/2023    2:04 PM  CMP  Glucose 70 - 99 mg/dL 119  147  829   BUN 6 - 20 mg/dL 19  18  22    Creatinine 0.61 - 1.24 mg/dL 5.62  1.30  8.65   Sodium 135 - 145 mmol/L 136  137  136   Potassium 3.5 - 5.1 mmol/L 4.0  4.1  4.2   Chloride 98 - 111 mmol/L 103  106  102    CO2 22 - 32 mmol/L 25  22  25    Calcium 8.9 - 10.3 mg/dL 8.9  9.2  9.4   Total Protein 6.5 - 8.1 g/dL 7.0  6.5  7.1   Total Bilirubin 0.0 - 1.2 mg/dL 0.5  0.7  0.4   Alkaline Phos 38 - 126 U/L 95  68  67   AST 15 - 41 U/L 31  16  18    ALT 0 - 44 U/L 27  15  15      RADIOGRAPHIC STUDIES: I have personally reviewed the radiological images as listed and agreed with the findings in the report.  MR Brain W Wo Contrast Result Date: 06/04/2023 CLINICAL DATA:  Renal cell carcinoma. EXAM: MRI HEAD WITHOUT AND WITH CONTRAST TECHNIQUE: Multiplanar, multiecho pulse sequences of the brain and surrounding structures were obtained without and with intravenous contrast. CONTRAST:  9mL GADAVIST GADOBUTROL 1 MMOL/ML  IV SOLN COMPARISON:  Head MRI 07/25/2019 FINDINGS: Brain: There is no evidence of an acute infarct, intracranial hemorrhage, mass, midline shift, or extra-axial fluid collection. Cerebral volume is within normal limits for age. The ventricles are normal in size. No significant white matter disease is evident. No abnormal enhancement is identified. A partially empty sella is unchanged. Vascular: Major intracranial vascular flow voids are preserved. Skull and upper cervical spine: Unremarkable bone marrow signal. Sinuses/Orbits: Unremarkable orbits. Paranasal sinuses and mastoid air cells are clear. Other: None. IMPRESSION: No evidence of intracranial metastases. Electronically Signed   By: Sebastian Ache M.D.   On: 06/04/2023 17:37   CT CHEST ABDOMEN PELVIS WO CONTRAST Result Date: 05/24/2023 CLINICAL DATA:  History of renal cell carcinoma metastatic. Follow-up. * Tracking Code: BO * EXAM: CT CHEST, ABDOMEN AND PELVIS WITHOUT CONTRAST TECHNIQUE: Multidetector CT imaging of the chest, abdomen and pelvis was performed following the standard protocol without IV contrast. RADIATION DOSE REDUCTION: This exam was performed according to the departmental dose-optimization program which includes automated exposure  control, adjustment of the mA and/or kV according to patient size and/or use of iterative reconstruction technique. COMPARISON:  Multiple priors including most recent CT April 09, 2023 FINDINGS: CT CHEST FINDINGS Cardiovascular: Scattered aortic atherosclerosis. Normal caliber central pulmonary arteries. Normal size heart. Coronary artery calcifications. No significant pericardial effusion/thickening. Mediastinum/Nodes: No suspicious thyroid nodule. Enlarged mediastinal lymph nodes are similar prior.  For reference: -right paratracheal lymph node measures 14 mm in short axis on image 30/2 previously 16 mm. -subcarinal lymph node measures 16 mm in short axis on image 36/2 previously 17 mm. No pathologically enlarged hilar or axillary lymph nodes noting limited evaluation of the hilar structures on noncontrast enhanced examination. Patulous esophagus. Lungs/Pleura: Slightly increased clustered nodularity interposed within interstitial prominence and ground-glass in the periphery of the right lower lobe on image 92/4. Stable scattered pulmonary nodules.  For reference: -pulmonary nodule in the right lung apex measures 3 mm on image 46/4 previously 4 mm. -left upper lobe pulmonary nodule measures 3 mm on image 63/4, previously 4 mm. -part solid nodule in the anterior left upper lobe measures 5 mm on image 64/4 previously 6 mm. No new suspicious pulmonary nodules or masses. Musculoskeletal: No aggressive lytic or blastic lesion of bone. Intramuscular lipoma in the left paraspinal musculature image 39/2. CT ABDOMEN PELVIS FINDINGS Hepatobiliary: Unremarkable noncontrast enhanced appearance of the hepatic parenchyma. Gallbladder is unremarkable. No biliary ductal dilation. Pancreas: No pancreatic ductal dilation or evidence of acute inflammation. Spleen: No splenomegaly. Adrenals/Urinary Tract: Bilateral adrenal glands appear normal. Right kidney surgically absent without new suspicious nodularity in the nephrectomy  bed. Stable soft tissue density along the posterior aspect of the surgical bed measuring 2.5 x 0.9 cm on image 72/2 stable dating back to Sep 29, 2020. Stable hyperdense 15 mm right renal lesion previously characterized as a hemorrhagic/proteinaceous renal cyst, although the lesion does not meet these criteria on today's examination measuring Hounsfield units of 58. Suggest attention on follow-up imaging. Urinary bladder is unremarkable for degree of distension. Stomach/Bowel: No radiopaque enteric contrast material was administered. Stomach is unremarkable for degree of distension. No pathologic dilation of small or large bowel. Normal appendix. No evidence of acute bowel inflammation. Vascular/Lymphatic: Aortic atherosclerosis. Smooth IVC contours. No pathologically enlarged abdominal or pelvic lymph nodes. Reproductive: Prostate is unremarkable. Other: No significant abdominopelvic free fluid. Musculoskeletal: No aggressive lytic or blastic lesion of bone. IMPRESSION: 1. Slightly increased clustered nodularity interposed within interstitial prominence and ground-glass in the  periphery of the right lower lobe. Suggest continued attention on short-term follow-up imaging to ensure resolution/stability. 2. Stable scattered additional tiny pulmonary nodules. 3. Stable enlarged mediastinal lymph nodes. 4. No evidence of recurrent or metastatic disease in the abdomen or pelvis. 5. Stable hyperdense 15 mm right renal lesion previously characterized as a hemorrhagic/proteinaceous renal cyst, although the lesion does not meet these criteria on today's examination measuring Hounsfield units of 58. Suggest attention on follow-up imaging. 6.  Aortic Atherosclerosis (ICD10-I70.0). Electronically Signed   By: Maudry Mayhew M.D.   On: 05/24/2023 15:58   DG Chest Port 1 View Result Date: 05/03/2023 CLINICAL DATA:  [id number] S/P bronchoscopy [id number] EXAM: PORTABLE CHEST 1 VIEW COMPARISON:  April 30, 2019, April 09, 2023  FINDINGS: The cardiomediastinal silhouette is unchanged in contour. No pleural effusion. No significant pneumothorax. No acute pleuroparenchymal abnormality. IMPRESSION: No significant pneumothorax. Electronically Signed   By: Meda Klinefelter M.D.   On: 05/03/2023 17:08   CT Chest Wo Contrast Result Date: 04/22/2023 CLINICAL DATA:  Renal cell carcinoma.  Restaging. * Tracking Code: BO * EXAM: CT CHEST WITHOUT CONTRAST TECHNIQUE: Multidetector CT imaging of the chest was performed following the standard protocol without IV contrast. RADIATION DOSE REDUCTION: This exam was performed according to the departmental dose-optimization program which includes automated exposure control, adjustment of the mA and/or kV according to patient size and/or use of iterative reconstruction technique. COMPARISON:  01/04/2023 FINDINGS: Cardiovascular: The heart size appears normal. Aortic atherosclerosis. Coronary artery calcifications. No pericardial effusion. Mediastinum/Nodes: Thyroid gland, trachea, and esophagus appear normal. Right paratracheal lymph node measures 1.6 cm, image 63/2. Previously 1.5 cm. Subcarinal lymph node measures 1.7 cm, image 77/2. Previously 1.5 cm. Hilar lymph nodes are suboptimally evaluated due to lack of IV contrast. Lungs/Pleura: No pleural effusion, airspace consolidation, atelectasis, or pneumothorax. Scattered lung nodules are again noted. -Index nodule in the right apex measures 4 mm, image 33/3. Formally 3 mm. -Left upper lobe lung nodule measures 4 mm, image 49/3. Unchanged from previous exam. -New part solid nodule within the anterior left upper lobe measures 6 mm, image 50/3. Unchanged focal area of ground-glass attenuation and interstitial prominence within the periphery of the right upper lobe is identified, image 77/3. Upper Abdomen: No acute abnormality. Status post right nephrectomy. Unchanged appearance 1.8 cm hyperdense lesion measuring 83 Hounsfield units in the posterior left  kidney, image 176/2. Previously characterized as a hemorrhagic cyst. Musculoskeletal: No chest wall mass or suspicious bone lesions identified. IMPRESSION: 1. Persistent right paratracheal and subcarinal adenopathy. These are stable to slightly increased in the interval (on the order of 1-2 mm). 2. Previously noted left upper lobe and right upper lobe lung nodules are not significantly changed in the interval. 3. New part solid nodule within the anterior left upper lobe measures 6 mm. The appearance is nonspecific. This may be postinflammatory or infectious in etiology. Consider short-term interval follow-up with repeat CT of the chest in 3 months to ensure resolution. 4. Stable appearance of focal area of ground-glass attenuation and interstitial prominence within the periphery of the right upper lobe. Favor sequelae of inflammation or infection 5. Coronary artery calcifications. 6.  Aortic Atherosclerosis (ICD10-I70.0). Electronically Signed   By: Signa Kell M.D.   On: 04/22/2023 13:20

## 2023-06-14 NOTE — Assessment & Plan Note (Signed)
Treatment plan as listed above.

## 2023-06-14 NOTE — Assessment & Plan Note (Signed)
Recommend imodium PRN as directed

## 2023-06-14 NOTE — Assessment & Plan Note (Addendum)
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  Recent CT showed thoracic lymphadenopathy which were biopsy proven recurrent metastatic RCC On 2nd line treatment with cabozantinib 60mg  daily,.  So far he tolerates well with mild diarrhea.  Continue

## 2023-06-19 ENCOUNTER — Encounter: Payer: Self-pay | Admitting: Oncology

## 2023-06-21 ENCOUNTER — Other Ambulatory Visit: Payer: Self-pay | Admitting: Internal Medicine

## 2023-06-21 ENCOUNTER — Other Ambulatory Visit: Payer: Self-pay | Admitting: Oncology

## 2023-06-22 MED ORDER — ALPRAZOLAM 0.5 MG PO TABS: 0.5000 mg | ORAL_TABLET | Freq: Two times a day (BID) | ORAL | 0 refills | Status: DC | PRN

## 2023-06-22 MED ORDER — CLOBETASOL PROPIONATE 0.05 % EX OINT: 1.0000 | TOPICAL_OINTMENT | Freq: Two times a day (BID) | CUTANEOUS | 0 refills | Status: AC | PRN

## 2023-06-25 ENCOUNTER — Encounter: Payer: Self-pay | Admitting: Oncology

## 2023-06-26 ENCOUNTER — Telehealth: Payer: Self-pay | Admitting: *Deleted

## 2023-06-26 NOTE — Telephone Encounter (Signed)
Patient and wife knows that the paper is done and copy for the form. Husband is thankful to get it done

## 2023-06-28 NOTE — Assessment & Plan Note (Signed)
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  Recent CT showed thoracic lymphadenopathy which were biopsy proven recurrent metastatic RCC On 2nd line treatment with cabozantinib 60mg  daily,.  So far he tolerates well with mild diarrhea.  Continue

## 2023-06-29 ENCOUNTER — Inpatient Hospital Stay: Payer: 59 | Attending: Oncology

## 2023-06-29 ENCOUNTER — Inpatient Hospital Stay: Payer: 59 | Admitting: Oncology

## 2023-06-29 ENCOUNTER — Encounter: Payer: Self-pay | Admitting: Oncology

## 2023-06-29 VITALS — BP 147/99 | HR 84 | Temp 96.0°F | Resp 18 | Wt 207.4 lb

## 2023-06-29 DIAGNOSIS — Z5111 Encounter for antineoplastic chemotherapy: Secondary | ICD-10-CM

## 2023-06-29 DIAGNOSIS — I10 Essential (primary) hypertension: Secondary | ICD-10-CM | POA: Diagnosis not present

## 2023-06-29 DIAGNOSIS — T451X5A Adverse effect of antineoplastic and immunosuppressive drugs, initial encounter: Secondary | ICD-10-CM

## 2023-06-29 DIAGNOSIS — C641 Malignant neoplasm of right kidney, except renal pelvis: Secondary | ICD-10-CM

## 2023-06-29 DIAGNOSIS — C7911 Secondary malignant neoplasm of bladder: Secondary | ICD-10-CM | POA: Insufficient documentation

## 2023-06-29 DIAGNOSIS — Z87891 Personal history of nicotine dependence: Secondary | ICD-10-CM | POA: Diagnosis not present

## 2023-06-29 DIAGNOSIS — K521 Toxic gastroenteritis and colitis: Secondary | ICD-10-CM | POA: Diagnosis not present

## 2023-06-29 DIAGNOSIS — Z79899 Other long term (current) drug therapy: Secondary | ICD-10-CM | POA: Insufficient documentation

## 2023-06-29 DIAGNOSIS — C7800 Secondary malignant neoplasm of unspecified lung: Secondary | ICD-10-CM | POA: Diagnosis not present

## 2023-06-29 LAB — CBC WITH DIFFERENTIAL (CANCER CENTER ONLY)
Abs Immature Granulocytes: 0.01 10*3/uL (ref 0.00–0.07)
Basophils Absolute: 0 10*3/uL (ref 0.0–0.1)
Basophils Relative: 0 %
Eosinophils Absolute: 0.5 10*3/uL (ref 0.0–0.5)
Eosinophils Relative: 8 %
HCT: 49.1 % (ref 39.0–52.0)
Hemoglobin: 16.4 g/dL (ref 13.0–17.0)
Immature Granulocytes: 0 %
Lymphocytes Relative: 39 %
Lymphs Abs: 2.6 10*3/uL (ref 0.7–4.0)
MCH: 29.2 pg (ref 26.0–34.0)
MCHC: 33.4 g/dL (ref 30.0–36.0)
MCV: 87.5 fL (ref 80.0–100.0)
Monocytes Absolute: 0.3 10*3/uL (ref 0.1–1.0)
Monocytes Relative: 5 %
Neutro Abs: 3.2 10*3/uL (ref 1.7–7.7)
Neutrophils Relative %: 48 %
Platelet Count: 170 10*3/uL (ref 150–400)
RBC: 5.61 MIL/uL (ref 4.22–5.81)
RDW: 13.4 % (ref 11.5–15.5)
WBC Count: 6.7 10*3/uL (ref 4.0–10.5)
nRBC: 0 % (ref 0.0–0.2)

## 2023-06-29 LAB — CMP (CANCER CENTER ONLY)
ALT: 41 U/L (ref 0–44)
AST: 39 U/L (ref 15–41)
Albumin: 3.9 g/dL (ref 3.5–5.0)
Alkaline Phosphatase: 90 U/L (ref 38–126)
Anion gap: 10 (ref 5–15)
BUN: 19 mg/dL (ref 6–20)
CO2: 25 mmol/L (ref 22–32)
Calcium: 9.4 mg/dL (ref 8.9–10.3)
Chloride: 101 mmol/L (ref 98–111)
Creatinine: 1.22 mg/dL (ref 0.61–1.24)
GFR, Estimated: >60 mL/min (ref >60)
Glucose, Bld: 132 mg/dL — ABNORMAL HIGH (ref 70–99)
Potassium: 4.3 mmol/L (ref 3.5–5.1)
Sodium: 136 mmol/L (ref 135–145)
Total Bilirubin: 0.8 mg/dL (ref 0.0–1.2)
Total Protein: 6.8 g/dL (ref 6.5–8.1)

## 2023-06-29 NOTE — Assessment & Plan Note (Signed)
Treatment plan as listed above.

## 2023-06-29 NOTE — Assessment & Plan Note (Signed)
BP has improve since increase lisinopril to 20mg  daily

## 2023-06-29 NOTE — Progress Notes (Addendum)
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  Recent CT showed thoracic lymphadenopathy which were biopsy proven recurrent metastatic RCC On 2nd line treatment with cabozantinib 60mg  daily,.  he tolerates well , continue current regimen.    Chemotherapy induced diarrhea Recommend imodium PRN as directed.   Encounter for antineoplastic chemotherapy Treatment plan as listed above.   Benign essential HTN BP has improve since increase lisinopril to 20mg  daily   Orders Placed This Encounter  Procedures   CBC with Differential (Cancer Center Only)    Standing Status:   Future    Expected Date:   07/13/2023    Expiration Date:   06/28/2024   CMP (Cancer Center only)    Standing Status:   Future    Expected Date:   07/13/2023    Expiration Date:   06/28/2024   Follow up 2 weeks .  All questions were answered. The patient knows to call the clinic with any problems, questions or concerns.  Rickard Patience, MD, PhD [facility] Hematology Oncology 06/29/2023     HISTORY OF PRESENTING ILLNESS:   Matthew Woodard is a  58 y.o.  male presents for follow up for metastatic kidney cancer Oncology History  Metastatic renal cell carcinoma (HCC)  06/2018 Imaging   CT urogram done which showed right 4 cm central enhancing renal mass with invasion into the collecting system. X-ray of chest was performed to see complete staging which showed no concerning findings   06/28/2018 Cancer Diagnosis      06/28/2018 Procedure    patient underwent a cystoscopy, bladder biopsy and a right retrograde pyelogram with intraoperative interpretation, right diagnostic ureteroscopy, right renal pelvis biopsy, right ureteral stent  placement.  biopsy showed atypical cell clusters, with extensive crush artifact, nondiagnostic.     07/22/2018 Initial Diagnosis   Patient underwent right radical laparoscopic nephrectomy with biopsy showing 5.3 cm RCC, clear cell type, grade 3, tumor invades renal vein and segmental branches, pelvic calyceal system and perirenal sinus/fat.  Surgical margins negative for tumor.pT3a,Nx Post-operation, patient was on surveillance after surgery.   10/28/2018 Imaging   CT abdomen pelvis with contrast showed minimal fluid and or postoperative changes within the right renal fossa.  No evidence of metastatic disease or recurrence.  Patient has left kidney lesion previously characterized as nonenhancing cyst by multi phasic contrast-enhanced CT.   04/30/2019 Progression   Patient reports an episode of gross hematuria in early December 2020, patient is due for 23-month surveillance CT scan. 04/30/2019 CT abdomen pelvis with contrast showed high attenuation mass at the right uretero vesicle junction, with extension into the bladder.  Bilateral pulmonary nodules, most indicated for metastatic disease.  Patient underwent cystoscopy and transurethral resection of irregular bladder tumor 3 cm.  Mass appeared to emanate from the right ureteral orifice.  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  #NGS: [employer] PD L1 TPS 1%   05/28/2019 Imaging   CT chest w contrast 1. Numerous bilateral pulmonary nodules measuring up to 10 mm diameter. Imaging features compatible with metastatic disease. 2. Left hilar lymphadenopathy, also highly suspicious for metastatic involvement.    05/28/2019 Imaging   Bone scan showed  No scintigraphic evidence of osseous metastatic disease.    06/05/2019 - 06/21/2021  Chemotherapy   06/05/19 Axitinib 5mg  BID+ [name]  09/2019 off axitinib and Keytruda since the beginning of May due to transaminitis. GI work up negative.  Ultrasound liver  vascular Doppler is negative #11/24/2019, resumed on Keytruda every 3 weeks #01/05/2020, axitinib was resumed 3 mg #01/20/2020, axitinib was discontinued due to recurrence of transaminitis.  [name] was temporarily held. #03/16/2020-06/21/2021 resumed on Keytruda every 3 weeks [name] was discontinued due to recurrent immunotherapy induced diarrhea    07/25/2019 Imaging   Brain w wo contrast No mass, hemorrhage, or abnormal enhancement.   09/29/2020 Imaging   CT scan showed no evidence of local recurrence or new/progressive disease in the chest abdomen or pelvis.  Tiny nonspecific lung nodules are unchanged.  No new lung nodules   01/14/2021 Imaging   CT chest abdomen pelvis showed stable exam, no new or progressive metastatic disease within chest abdomen pelvis. Stable lung nodules. Aortic atherosclerosis.    04/15/2021 Imaging   CT chest abdomen pelvis with contrast showed stable examination.  No evidence of recurrent or metastatic carcinoma within the chest abdomen pelvis.     08/02/2021 Imaging   CT chest abdomen pelvis without contrast, stable examination without evidence of recurrence or metastatic disease.  Mild asymmetric diffuse esophageal wall thickening, similar to prior examination.  Likely chronic esophagitis.  Aortic atherosclerosis   10/21/2021 Procedure   colonoscopy showed 3 mm polyp which was resected and retrieved.  Negative for dysplasia/malignancy.  Random biopsy showed lymphocytic colitis. EGD showed long segment Barrett's esophagus.  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  Negative for dysplasia and malignancy.  Small bowel cold biopsy showed reactive duodenitis.  Negative for CMV.  Negative for dysplasia or malignancy.   11/16/2021 Imaging   CT chest abdomen pelvis without contrast showed stable examination without evidence of local recurrence or active malignant disease in the chest abdomen pelvis.  Reflux retained contrast in the  esophagus suggestive of esophagitis.  Aortic atherosclerosis   07/03/2022 Imaging   CT chest abdomen pelvis w contrast   1. Stable examination status post right nephrectomy without evidence of new or progressive disease in the chest, abdomen or pelvis. 2. Stable scattered tiny pulmonary nodules favored benign etiology. 3.  Aortic Atherosclerosis    01/04/2023 Imaging   CT chest abdomen pelvis wo contrast showed  1. New clustered nodularity and ground-glass in the peripheral right lower lobe, likely infectious/inflammatory. Additionally there are new enlarged mediastinal lymph nodes which given the above findings are favored reactive but nonspecific. Suggest attention on short-term interval follow-up dedicated chest CT to ensure resolution. 2. Stable scattered tiny pulmonary nodules. 3. No evidence of local recurrence or metastatic disease in the abdomen or pelvis. 4.  Aortic Atherosclerosis   04/09/2023 Imaging   CT chest wo contrast  1. Persistent right paratracheal and subcarinal adenopathy. These are stable to slightly increased in the interval (on the order of 1-2 mm). 2. Previously noted left upper lobe and right upper lobe lung nodules are not significantly changed in the interval. 3. New part solid nodule within the anterior left upper lobe measures 6 mm. The appearance is nonspecific. This may be postinflammatory or infectious in etiology. Consider short-term interval follow-up with repeat CT of the chest in 3 months to ensure resolution. 4. Stable appearance of focal area of ground-glass attenuation and interstitial prominence within the periphery of the right upper lobe. Favor sequelae of inflammation or infection 5. Coronary artery calcifications. 6.  Aortic Atherosclerosis    05/03/2023 Relapse/Recurrence  Biopsy via bronchoscopy  A. LYMPH NODE, 4R, FINE NEEDLE ASPIRATION:  - Carcinoma, consistent with renal cell carcinoma  - No definitive lymphoid tissue  identified   B. LYMPH NODE, 7, FINE NEEDLE ASPIRATION:  - Carcinoma, consistent with renal cell carcinoma  - Lymphoid tissue identified   C. LYMPH NODE, 11R, FINE NEEDLE ASPIRATION:  - Carcinoma, consistent with renal cell carcinoma  - Lymphoid tissue identified    05/30/2023 -  Chemotherapy   Cabozantinib 60mg  daily   Clear cell carcinoma of right kidney (HCC)     #History of cutaneous psoriasis, mostly on his right hand,    INTERVAL HISTORY Matthew Woodard is a 58 y.o. male who has above history reviewed by me today presents for follow up visit for management of metastatic RCC. Patient reports doing well.Accompanied by his wife.  + Chronic cough, stable He has been on cabozantinib 60mg  daily since 05/30/2023 , so far tolerates well.  Initially he experienced diarrhea for which he took imodium PRN. Diarrhea has resolved.   Review of Systems  Constitutional:  Negative for appetite change, chills, fatigue, fever and unexpected weight change.  HENT:   Negative for hearing loss and voice change.   Eyes:  Negative for eye problems and icterus.  Respiratory:  Positive for cough. Negative for chest tightness and shortness of breath.   Cardiovascular:  Negative for chest pain and leg swelling.  Gastrointestinal:  Negative for abdominal distention, abdominal pain, blood in stool, diarrhea and nausea.  Endocrine: Negative for hot flashes.  Genitourinary:  Negative for difficulty urinating, dysuria and frequency.   Musculoskeletal:        Bilateral knee replacement.   Skin:  Negative for itching and rash.  Neurological:  Negative for extremity weakness, headaches, light-headedness and numbness.  Hematological:  Negative for adenopathy. Does not bruise/bleed easily.  Psychiatric/Behavioral:  Negative for confusion.     MEDICAL HISTORY:  Past Medical History:  Diagnosis Date   Arthritis    hands, left knee   Cancer (HCC)    COPD (chronic obstructive pulmonary disease) (HCC)     Diabetes mellitus without complication (HCC)    Eczema 01/08/2017   GERD (gastroesophageal reflux disease)    History of kidney cancer    Hyperlipidemia LDL goal <100 11/02/2014   Hypertension    Kidney stone    YEAR H/O HEMATURIA   Psoriasis    Wears dentures    full upper, partial lower.  Has, does not wear    SURGICAL HISTORY: Past Surgical History:  Procedure Laterality Date   BRONCHIAL NEEDLE ASPIRATION BIOPSY  05/03/2023   Procedure: BRONCHIAL NEEDLE ASPIRATION BIOPSIES;  Surgeon: Janann Colonel, MD;  Location: [facility];  Service: Pulmonary;;   COLONOSCOPY N/A 10/21/2021   Procedure: COLONOSCOPY;  Surgeon: Midge Minium, MD;  Location: [facility];  Service: Endoscopy;  Laterality: N/A;   COLONOSCOPY WITH PROPOFOL N/A 02/23/2017   Procedure: COLONOSCOPY WITH PROPOFOL;  Surgeon: Midge Minium, MD;  Location: [facility];  Service: Endoscopy;  Laterality: N/A;  Diabetic - oral meds   CYST EXCISION  12/2014   on back   CYSTOSCOPY W/ RETROGRADES Right 06/28/2018   Procedure: CYSTOSCOPY WITH RETROGRADE PYELOGRAM;  Surgeon: Sondra Come, MD;  Location: [facility] ORS;  Service: Urology;  Laterality: Right;   CYSTOSCOPY W/ URETERAL STENT REMOVAL Right 07/22/2018   Procedure: CYSTOSCOPY WITH STENT REMOVAL;  Surgeon: Sondra Come, MD;  Location: [facility];  Service: Urology;  Laterality: Right;   CYSTOSCOPY WITH  BIOPSY Right 06/28/2018   Procedure: CYSTOSCOPY WITH RENAL BIOPSY;  Surgeon: Sondra Come, MD;  Location: [facility];  Service: Urology;  Laterality: Right;   CYSTOSCOPY WITH URETEROSCOPY AND STENT PLACEMENT Right 06/28/2018   Procedure: CYSTOSCOPY WITH URETEROSCOPY AND STENT PLACEMENT;  Surgeon: Sondra Come, MD;  Location: [facility];  Service: Urology;  Laterality: Right;   ESOPHAGOGASTRODUODENOSCOPY N/A 10/21/2021   Procedure: ESOPHAGOGASTRODUODENOSCOPY (EGD);  Surgeon: Midge Minium, MD;  Location: [facility];  Service: Endoscopy;   Laterality: N/A;   FULGURATION OF BLADDER TUMOR N/A 06/28/2018   Procedure: CYSTOSCOPY BLADDER BIOPSY, FULGERATION OF BLADDER;  Surgeon: Sondra Come, MD;  Location: [facility];  Service: Urology;  Laterality: N/A;   JOINT REPLACEMENT Left    KNEE SURGERY Left    LAPAROSCOPIC NEPHRECTOMY, HAND ASSISTED Right 07/22/2018   Procedure: HAND ASSISTED LAPAROSCOPIC NEPHRECTOMY;  Surgeon: Sondra Come, MD;  Location: [facility];  Service: Urology;  Laterality: Right;   NASAL SINUS SURGERY  07/2013   POLYPECTOMY N/A 02/23/2017   Procedure: POLYPECTOMY;  Surgeon: Midge Minium, MD;  Location: [facility];  Service: Endoscopy;  Laterality: N/A;   POLYPECTOMY  10/21/2021   Procedure: POLYPECTOMY;  Surgeon: Midge Minium, MD;  Location: [facility];  Service: Endoscopy;;   REPLACEMENT TOTAL KNEE Right    tooth abstraction     TRANSURETHRAL RESECTION OF BLADDER TUMOR N/A 05/12/2019   Procedure: TRANSURETHRAL RESECTION OF BLADDER TUMOR (TURBT);  Surgeon: Sondra Come, MD;  Location: [facility] OR[facility];  Service: Urology;  Laterality: N/A;   VARICOCELE EXCISION     VIDEO BRONCHOSCOPY WITH ENDOBRONCHIAL ULTRASOUND N/A 05/03/2023   Procedure: VIDEO BRONCHOSCOPY WITH ENDOBRONCHIAL ULTRASOUND;  Surgeon: Janann Colonel, MD;  Location: [facility];  Service: Pulmonary;  Laterality: N/A;   XI ROBOTIC ASSISTED VENTRAL HERNIA N/A 11/18/2020   Procedure: XI ROBOTIC ASSISTED VENTRAL HERNIA, incisional;  Surgeon: Leafy Ro, MD;  Location: [facility];  Service: General;  Laterality: N/A;    SOCIAL HISTORY: Social History   Socioeconomic History   Marital status: Married    Spouse name: Isabelle Course   Number of children: Not on file   Years of education: Not on file   Highest education level: Not on file  Occupational History   Not on file  Tobacco Use   Smoking status: Former    Current packs/day: 0.00    Types: Cigarettes    Quit date: 06/15/2013    Years since quitting: 10.0   Smokeless  tobacco: Never  Vaping Use   Vaping status: Never Used  Substance and Sexual Activity   Alcohol use: No    Alcohol/week: 0.0 standard drinks of alcohol   Drug use: Yes    Types: Marijuana   Sexual activity: Yes  Other Topics Concern   Not on file  Social History Narrative   Lives with wife, sister in law   Social Drivers of Health   Financial Resource Strain: Low Risk  (09/13/2021)   Received [facility], [facility]   Overall Financial Resource Strain (CARDIA)    Difficulty of Paying Living Expenses: Not hard at all  Food Insecurity: No Food Insecurity (09/13/2021)   Received from [facility], [facility]   Hunger Vital Sign    Worried About Running Out of Food in the Last Year: Never true    Ran Out of Food in the Last Year: Never true  Transportation Needs: No Transportation Needs (09/13/2021)   Received from [facility], [facility]   [facility] - Transportation    Lack of Transportation (Medical): No    Lack of Transportation (Non-Medical): No  Physical Activity: Not on file  Stress: Not on file  Social Connections: Not on file  Intimate Partner Violence: Not on file    FAMILY HISTORY: Family History  Problem Relation Age of Onset   Heart disease Father    Hypertension Father    COPD Father    Colon cancer Maternal Grandmother    Heart attack Paternal Grandfather     ALLERGIES:  is allergic to axitinib, propofol, and glucotrol [glipizide].  MEDICATIONS:  Current Outpatient Medications  Medication Sig Dispense Refill   albuterol (VENTOLIN HFA) 108 (90 Base) MCG/ACT inhaler Inhale 2 puffs into the lungs every 6 (six) hours as needed for wheezing or shortness of breath. 8 g 2   ALPRAZolam (XANAX) 0.5 MG tablet Take 1 tablet (0.5 mg total) by mouth 2 (two) times daily as needed for anxiety or sleep. 60 tablet 0   aspirin 81 MG EC tablet Take 81 mg by mouth daily with supper.     CABOMETYX 60 MG tablet TAKE 1 TABLET BY MOUTH 1 TIME A DAY ON AN  EMPTY STOMACH, 1 HOUR BEFORE OR 2 HOURS AFTER MEALS 30 tablet 0   clobetasol ointment (TEMOVATE) 0.05 % Apply 1 Application topically 2 (two) times daily as needed. 30 g 0   empagliflozin (JARDIANCE) 25 MG TABS tablet Take 1 tablet (25 mg total) by mouth daily before breakfast. 90 tablet 1   fluticasone (FLONASE) 50 MCG/ACT nasal spray Place 2 sprays into both nostrils daily. 16 g 1   Glucose Blood (ACCU-CHEK AVIVA PLUS VI) by In Vitro route.     glucose blood test strip Check tid, one touch verio 100 each 12   lisinopril (ZESTRIL) 10 MG tablet Take 1 tablet (10 mg total) by mouth daily. 90 tablet 1   loperamide (IMODIUM) 2 MG capsule Take 1 capsule (2 mg total) by mouth See admin instructions. With onset of diarrhea take 4 mg,the 2 mg every 2 hours (4 mg every 4 hours at night)  maximum: 16 mg/day 60 capsule 2   metFORMIN (GLUCOPHAGE-XR) 500 MG 24 hr tablet Take 2 tablets (1,000 mg total) by mouth 2 (two) times daily with a meal. 180 tablet 3   Multiple Vitamin (MULTIVITAMIN) tablet Take 1 tablet by mouth daily with supper.     omeprazole (PRILOSEC) 20 MG capsule TAKE 1 CAPSULE DAILY AS    NEEDED FOR ACID REFLUX 90 capsule 0   ondansetron (ZOFRAN) 8 MG tablet Take 1 tablet (8 mg total) by mouth every 8 (eight) hours as needed for nausea or vomiting. 20 tablet 1   rosuvastatin (CRESTOR) 20 MG tablet Take 1 tablet (20 mg total) by mouth daily with supper. 90 tablet 1   Semaglutide, 1 MG/DOSE, 4 MG/3ML SOPN Inject 1 mg as directed once a week. 3 mL 2   umeclidinium-vilanterol (ANORO ELLIPTA) 62.5-25 MCG/ACT AEPB Inhale 1 puff into the lungs daily. 30 each 3   prochlorperazine (COMPAZINE) 10 MG tablet Take 1 tablet (10 mg total) by mouth every 6 (six) hours as needed. (Patient not taking: Reported on 06/29/2023) 30 tablet 1   No current facility-administered medications for this visit.     PHYSICAL EXAMINATION: ECOG PERFORMANCE STATUS: 1 - Symptomatic but completely ambulatory Vitals:   06/29/23  1058 06/29/23 1107  BP: (!) 162/107 (!) 147/99  Pulse: 84   Resp: 18  Temp: (!) 96 F (35.6 C)   SpO2: 97%    Filed Weights   06/29/23 1058  Weight: 207 lb 6.4 oz (94.1 kg)     Physical Exam Constitutional:      General: He is not in acute distress. HENT:     Head: Normocephalic.  Eyes:     General: No scleral icterus. Cardiovascular:     Rate and Rhythm: Normal rate and regular rhythm.  Pulmonary:     Effort: Pulmonary effort is normal. No respiratory distress.     Breath sounds: No wheezing.  Abdominal:     General: Bowel sounds are normal.     Palpations: Abdomen is soft.  Musculoskeletal:        General: No deformity. Normal range of motion.     Cervical back: Normal range of motion and neck supple.     Comments: Bilateral knee replacement.   Skin:    General: Skin is warm and dry.     Findings: No erythema or rash.  Neurological:     Mental Status: He is alert and oriented to person, place, and time. Mental status is at baseline.  Psychiatric:        Mood and Affect: Mood normal.     LABORATORY DATA:  I have reviewed the data as listed    Latest Ref Rng & Units 06/29/2023   10:40 AM 06/14/2023    8:41 AM 05/03/2023   10:54 AM  CBC  WBC 4.0 - 10.5 K/uL 6.7  7.3  7.3   Hemoglobin 13.0 - 17.0 g/dL 75.6  43.3  29.5   Hematocrit 39.0 - 52.0 % 49.1  45.1  43.8   Platelets 150 - 400 K/uL 170  209  232       Latest Ref Rng & Units 06/29/2023   10:40 AM 06/14/2023    8:41 AM 05/03/2023   10:54 AM  CMP  Glucose 70 - 99 mg/dL 188  416  606   BUN 6 - 20 mg/dL 19  19  18    Creatinine 0.61 - 1.24 mg/dL 3.01  6.01  0.93   Sodium 135 - 145 mmol/L 136  136  137   Potassium 3.5 - 5.1 mmol/L 4.3  4.0  4.1   Chloride 98 - 111 mmol/L 101  103  106   CO2 22 - 32 mmol/L 25  25  22    Calcium 8.9 - 10.3 mg/dL 9.4  8.9  9.2   Total Protein 6.5 - 8.1 g/dL 6.8  7.0  6.5   Total Bilirubin 0.0 - 1.2 mg/dL 0.8  0.5  0.7   Alkaline Phos 38 - 126 U/L 90  95  68   AST 15 - 41  U/L 39  31  16   ALT 0 - 44 U/L 41  27  15     RADIOGRAPHIC STUDIES: I have personally reviewed the radiological images as listed and agreed with the findings in the report.  MR Brain W Wo Contrast Result Date: 06/04/2023 CLINICAL DATA:  Renal cell carcinoma. EXAM: MRI HEAD WITHOUT AND WITH CONTRAST TECHNIQUE: Multiplanar, multiecho pulse sequences of the brain and surrounding structures were obtained without and with intravenous contrast. CONTRAST:  9mL GADAVIST GADOBUTROL 1 MMOL/ML IV SOLN COMPARISON:  Head MRI 07/25/2019 FINDINGS: Brain: There is no evidence of an acute infarct, intracranial hemorrhage, mass, midline shift, or extra-axial fluid collection. Cerebral volume is within normal limits for age. The ventricles are normal in size. No significant white  matter disease is evident. No abnormal enhancement is identified. A partially empty sella is unchanged. Vascular: Major intracranial vascular flow voids are preserved. Skull and upper cervical spine: Unremarkable bone marrow signal. Sinuses/Orbits: Unremarkable orbits. Paranasal sinuses and mastoid air cells are clear. Other: None. IMPRESSION: No evidence of intracranial metastases. Electronically Signed   By: Sebastian Ache M.D.   On: 06/04/2023 17:37   CT CHEST ABDOMEN PELVIS WO CONTRAST Result Date: 05/24/2023 CLINICAL DATA:  History of renal cell carcinoma metastatic. Follow-up. * Tracking Code: BO * EXAM: CT CHEST, ABDOMEN AND PELVIS WITHOUT CONTRAST TECHNIQUE: Multidetector CT imaging of the chest, abdomen and pelvis was performed following the standard protocol without IV contrast. RADIATION DOSE REDUCTION: This exam was performed according to the departmental dose-optimization program which includes automated exposure control, adjustment of the mA and/or kV according to patient size and/or use of iterative reconstruction technique. COMPARISON:  Multiple priors including most recent CT April 09, 2023 FINDINGS: CT CHEST FINDINGS  Cardiovascular: Scattered aortic atherosclerosis. Normal caliber central pulmonary arteries. Normal size heart. Coronary artery calcifications. No significant pericardial effusion/thickening. Mediastinum/Nodes: No suspicious thyroid nodule. Enlarged mediastinal lymph nodes are similar prior.  For reference: -right paratracheal lymph node measures 14 mm in short axis on image 30/2 previously 16 mm. -subcarinal lymph node measures 16 mm in short axis on image 36/2 previously 17 mm. No pathologically enlarged hilar or axillary lymph nodes noting limited evaluation of the hilar structures on noncontrast enhanced examination. Patulous esophagus. Lungs/Pleura: Slightly increased clustered nodularity interposed within interstitial prominence and ground-glass in the periphery of the right lower lobe on image 92/4. Stable scattered pulmonary nodules.  For reference: -pulmonary nodule in the right lung apex measures 3 mm on image 46/4 previously 4 mm. -left upper lobe pulmonary nodule measures 3 mm on image 63/4, previously 4 mm. -part solid nodule in the anterior left upper lobe measures 5 mm on image 64/4 previously 6 mm. No new suspicious pulmonary nodules or masses. Musculoskeletal: No aggressive lytic or blastic lesion of bone. Intramuscular lipoma in the left paraspinal musculature image 39/2. CT ABDOMEN PELVIS FINDINGS Hepatobiliary: Unremarkable noncontrast enhanced appearance of the hepatic parenchyma. Gallbladder is unremarkable. No biliary ductal dilation. Pancreas: No pancreatic ductal dilation or evidence of acute inflammation. Spleen: No splenomegaly. Adrenals/Urinary Tract: Bilateral adrenal glands appear normal. Right kidney surgically absent without new suspicious nodularity in the nephrectomy bed. Stable soft tissue density along the posterior aspect of the surgical bed measuring 2.5 x 0.9 cm on image 72/2 stable dating back to Sep 29, 2020. Stable hyperdense 15 mm right renal lesion previously characterized  as a hemorrhagic/proteinaceous renal cyst, although the lesion does not meet these criteria on today's examination measuring Hounsfield units of 58. Suggest attention on follow-up imaging. Urinary bladder is unremarkable for degree of distension. Stomach/Bowel: No radiopaque enteric contrast material was administered. Stomach is unremarkable for degree of distension. No pathologic dilation of small or large bowel. Normal appendix. No evidence of acute bowel inflammation. Vascular/Lymphatic: Aortic atherosclerosis. Smooth IVC contours. No pathologically enlarged abdominal or pelvic lymph nodes. Reproductive: Prostate is unremarkable. Other: No significant abdominopelvic free fluid. Musculoskeletal: No aggressive lytic or blastic lesion of bone. IMPRESSION: 1. Slightly increased clustered nodularity interposed within interstitial prominence and ground-glass in the periphery of the right lower lobe. Suggest continued attention on short-term follow-up imaging to ensure resolution/stability. 2. Stable scattered additional tiny pulmonary nodules. 3. Stable enlarged mediastinal lymph nodes. 4. No evidence of recurrent or metastatic disease in the abdomen or pelvis. 5.  Stable hyperdense 15 mm right renal lesion previously characterized as a hemorrhagic/proteinaceous renal cyst, although the lesion does not meet these criteria on today's examination measuring Hounsfield units of 58. Suggest attention on follow-up imaging. 6.  Aortic Atherosclerosis (ICD10-I70.0). Electronically Signed   By: Maudry Mayhew M.D.   On: 05/24/2023 15:58   DG Chest Port 1 View Result Date: 05/03/2023 CLINICAL DATA:  [id number] S/P bronchoscopy [id number] EXAM: PORTABLE CHEST 1 VIEW COMPARISON:  April 30, 2019, April 09, 2023 FINDINGS: The cardiomediastinal silhouette is unchanged in contour. No pleural effusion. No significant pneumothorax. No acute pleuroparenchymal abnormality. IMPRESSION: No significant pneumothorax. Electronically Signed    By: Meda Klinefelter M.D.   On: 05/03/2023 17:08   CT Chest Wo Contrast Result Date: 04/22/2023 CLINICAL DATA:  Renal cell carcinoma.  Restaging. * Tracking Code: BO * EXAM: CT CHEST WITHOUT CONTRAST TECHNIQUE: Multidetector CT imaging of the chest was performed following the standard protocol without IV contrast. RADIATION DOSE REDUCTION: This exam was performed according to the departmental dose-optimization program which includes automated exposure control, adjustment of the mA and/or kV according to patient size and/or use of iterative reconstruction technique. COMPARISON:  01/04/2023 FINDINGS: Cardiovascular: The heart size appears normal. Aortic atherosclerosis. Coronary artery calcifications. No pericardial effusion. Mediastinum/Nodes: Thyroid gland, trachea, and esophagus appear normal. Right paratracheal lymph node measures 1.6 cm, image 63/2. Previously 1.5 cm. Subcarinal lymph node measures 1.7 cm, image 77/2. Previously 1.5 cm. Hilar lymph nodes are suboptimally evaluated due to lack of IV contrast. Lungs/Pleura: No pleural effusion, airspace consolidation, atelectasis, or pneumothorax. Scattered lung nodules are again noted. -Index nodule in the right apex measures 4 mm, image 33/3. Formally 3 mm. -Left upper lobe lung nodule measures 4 mm, image 49/3. Unchanged from previous exam. -New part solid nodule within the anterior left upper lobe measures 6 mm, image 50/3. Unchanged focal area of ground-glass attenuation and interstitial prominence within the periphery of the right upper lobe is identified, image 77/3. Upper Abdomen: No acute abnormality. Status post right nephrectomy. Unchanged appearance 1.8 cm hyperdense lesion measuring 83 Hounsfield units in the posterior left kidney, image 176/2. Previously characterized as a hemorrhagic cyst. Musculoskeletal: No chest wall mass or suspicious bone lesions identified. IMPRESSION: 1. Persistent right paratracheal and subcarinal adenopathy. These are  stable to slightly increased in the interval (on the order of 1-2 mm). 2. Previously noted left upper lobe and right upper lobe lung nodules are not significantly changed in the interval. 3. New part solid nodule within the anterior left upper lobe measures 6 mm. The appearance is nonspecific. This may be postinflammatory or infectious in etiology. Consider short-term interval follow-up with repeat CT of the chest in 3 months to ensure resolution. 4. Stable appearance of focal area of ground-glass attenuation and interstitial prominence within the periphery of the right upper lobe. Favor sequelae of inflammation or infection 5. Coronary artery calcifications. 6.  Aortic Atherosclerosis (ICD10-I70.0). Electronically Signed   By: Signa Kell M.D.   On: 04/22/2023 13:20

## 2023-06-29 NOTE — Assessment & Plan Note (Signed)
Recommend imodium PRN as directed.

## 2023-07-02 ENCOUNTER — Other Ambulatory Visit: Payer: Self-pay | Admitting: Oncology

## 2023-07-02 DIAGNOSIS — C641 Malignant neoplasm of right kidney, except renal pelvis: Secondary | ICD-10-CM

## 2023-07-08 ENCOUNTER — Encounter: Payer: Self-pay | Admitting: Oncology

## 2023-07-09 ENCOUNTER — Other Ambulatory Visit: Payer: Self-pay

## 2023-07-09 MED ORDER — STERILE WATER FOR INJECTION IJ SOLN
5.0000 mL | Freq: Four times a day (QID) | OROMUCOSAL | 2 refills | Status: DC | PRN
  Filled 2023-07-09: qty 240, 12d supply, fill #0

## 2023-07-09 NOTE — Telephone Encounter (Signed)
Please advise on symptoms

## 2023-07-10 ENCOUNTER — Other Ambulatory Visit: Payer: 59

## 2023-07-11 ENCOUNTER — Other Ambulatory Visit: Payer: Self-pay

## 2023-07-11 ENCOUNTER — Encounter: Payer: Self-pay | Admitting: Internal Medicine

## 2023-07-11 ENCOUNTER — Ambulatory Visit: Payer: 59 | Admitting: Internal Medicine

## 2023-07-11 VITALS — BP 124/80 | HR 94 | Temp 97.7°F | Resp 16 | Ht 72.0 in | Wt 201.7 lb

## 2023-07-11 DIAGNOSIS — I10 Essential (primary) hypertension: Secondary | ICD-10-CM | POA: Diagnosis not present

## 2023-07-11 DIAGNOSIS — E1165 Type 2 diabetes mellitus with hyperglycemia: Secondary | ICD-10-CM | POA: Diagnosis not present

## 2023-07-11 DIAGNOSIS — Z7984 Long term (current) use of oral hypoglycemic drugs: Secondary | ICD-10-CM

## 2023-07-11 DIAGNOSIS — C641 Malignant neoplasm of right kidney, except renal pelvis: Secondary | ICD-10-CM

## 2023-07-11 DIAGNOSIS — E785 Hyperlipidemia, unspecified: Secondary | ICD-10-CM | POA: Diagnosis not present

## 2023-07-11 LAB — POCT GLYCOSYLATED HEMOGLOBIN (HGB A1C): Hemoglobin A1C: 7.1 % — AB (ref 4.0–5.6)

## 2023-07-11 MED ORDER — LISINOPRIL 20 MG PO TABS: 20.0000 mg | ORAL_TABLET | Freq: Every day | ORAL | 3 refills | Status: DC

## 2023-07-11 MED ORDER — EMPAGLIFLOZIN 25 MG PO TABS: 25.0000 mg | ORAL_TABLET | Freq: Every day | ORAL | 1 refills | Status: DC

## 2023-07-11 MED ORDER — ROSUVASTATIN CALCIUM 20 MG PO TABS
20.0000 mg | ORAL_TABLET | Freq: Every day | ORAL | 1 refills | Status: DC
Start: 2023-07-11 — End: 2024-02-21

## 2023-07-11 MED ORDER — SEMAGLUTIDE (1 MG/DOSE) 4 MG/3ML ~~LOC~~ SOPN: 1.0000 mg | PEN_INJECTOR | SUBCUTANEOUS | 2 refills | Status: DC

## 2023-07-11 NOTE — Assessment & Plan Note (Addendum)
 A1c controlled at 7.1%, can decrease Metformin further to 1000 mg once daily, Jardiance and Ozempic. Foot exam and microalbumin today.

## 2023-07-11 NOTE — Assessment & Plan Note (Signed)
 Had side effects to new medication, has appointment with Dr. Cathie Hoops soon to discuss options going forward.

## 2023-07-11 NOTE — Assessment & Plan Note (Signed)
 Increased Lisinopril to 20 mg daily.

## 2023-07-11 NOTE — Assessment & Plan Note (Signed)
 Stable on statin, refilled.

## 2023-07-11 NOTE — Progress Notes (Signed)
 Established Patient Office Visit  Subjective    Patient ID: Matthew Woodard, male    DOB: 05/24/1965  Age: 59 y.o. MRN: [id number]  CC:  Chief Complaint  Patient presents with   Medical Management of Chronic Issues    3 month recheck    HPI Matthew Woodard presents to follow up.   Hypertension: -Medications: Lisinopril 10 mg but his BP has been higher since he's been on his new chemo so he increased the dose to 20 mg  -Patient is compliant with above medications and reports no side effects. -Denies any SOB, CP, vision changes, LE edema or symptoms of hypotension  HLD: -Medications: Crestor 20 mg -Patient is compliant with above medications and reports no side effects.  -Last lipid panel: Lipid Panel     Component Value Date/Time   CHOL 134 04/28/2022 1157   CHOL 157 11/02/2014 0825   CHOL 232 (H) 02/01/2013 0826   TRIG 103 04/28/2022 1157   TRIG 117 02/01/2013 0826   HDL 46 04/28/2022 1157   HDL 50 11/02/2014 0825   HDL 47 02/01/2013 0826   CHOLHDL 2.9 04/28/2022 1157   VLDL 28 01/08/2017 1012   VLDL 23 02/01/2013 0826   LDLCALC 69 04/28/2022 1157   LDLCALC 162 (H) 02/01/2013 0826   LABVLDL 27 11/02/2014 0825    Diabetes, Type 2: -Last A1c 7.0% 9/24 -Medications: Jardiance 25 mg, Ozempic 1 mg weekly, Metformin 500 mg XR but cut back to 3 tablets at night instead of 4 -Patient is compliant with the above medications and reports no side effects.  -Eye exam: Due, will call to schedule -Foot exam: Due -Microalbumin: Due -Statin: yes -PNA vaccine: declines -Denies symptoms of hypoglycemia, polyuria, polydipsia, numbness extremities, but did have some ulcers on his feet after new chemo regimen   Renal Cell Carcinoma:  -First diagnosed 06/2018, right kidney removed 07/2018; had spread to bladder and lungs but had been in remission for 1 year. -Following with Oncology Dr. Cathie Hoops, note reviewed from 06/29/23 -Recent CT scan showing thoracic lymphadenopathy which was  proven on biopsy to show recurrent metastatic RCC -Started on Cabozantinib 60 mg daily but was having side effects, not on anything now but has an appointment soon to discuss  GERD/Barrett's Esophagus: -Currently on Prilosec 20 mg PRN  Health Maintenance: -Colon cancer screening: colonoscopy 10/2021, repeat in 7 years -Blood work UTD   Outpatient Encounter Medications as of 07/11/2023  Medication Sig   albuterol (VENTOLIN HFA) 108 (90 Base) MCG/ACT inhaler Inhale 2 puffs into the lungs every 6 (six) hours as needed for wheezing or shortness of breath.   ALPRAZolam (XANAX) 0.5 MG tablet Take 1 tablet (0.5 mg total) by mouth 2 (two) times daily as needed for anxiety or sleep.   aspirin 81 MG EC tablet Take 81 mg by mouth daily with supper.   clobetasol ointment (TEMOVATE) 0.05 % Apply 1 Application topically 2 (two) times daily as needed.   fluticasone (FLONASE) 50 MCG/ACT nasal spray Place 2 sprays into both nostrils daily.   lisinopril (ZESTRIL) 20 MG tablet Take 1 tablet (20 mg total) by mouth daily.   magic mouthwash (multi-ingredient) oral suspension Take 5 ml by mouth 4 (four) times daily as needed for mouth pain.   metFORMIN (GLUCOPHAGE-XR) 500 MG 24 hr tablet Take 2 tablets (1,000 mg total) by mouth 2 (two) times daily with a meal.   Multiple Vitamin (MULTIVITAMIN) tablet Take 1 tablet by mouth daily with supper.  omeprazole (PRILOSEC) 20 MG capsule TAKE 1 CAPSULE DAILY AS    NEEDED FOR ACID REFLUX   ondansetron (ZOFRAN) 8 MG tablet Take 1 tablet (8 mg total) by mouth every 8 (eight) hours as needed for nausea or vomiting.   prochlorperazine (COMPAZINE) 10 MG tablet Take 1 tablet (10 mg total) by mouth every 6 (six) hours as needed.   umeclidinium-vilanterol (ANORO ELLIPTA) 62.5-25 MCG/ACT AEPB Inhale 1 puff into the lungs daily.   [DISCONTINUED] empagliflozin (JARDIANCE) 25 MG TABS tablet Take 1 tablet (25 mg total) by mouth daily before breakfast.   [DISCONTINUED] lisinopril  (ZESTRIL) 10 MG tablet Take 1 tablet (10 mg total) by mouth daily.   [DISCONTINUED] rosuvastatin (CRESTOR) 20 MG tablet Take 1 tablet (20 mg total) by mouth daily with supper.   [DISCONTINUED] Semaglutide, 1 MG/DOSE, 4 MG/3ML SOPN Inject 1 mg as directed once a week.   CABOMETYX 60 MG tablet TAKE 1 TABLET BY MOUTH 1 TIME A DAY ON AN EMPTY STOMACH, 1 HOUR BEFORE OR 2 HOURS AFTER MEALS (Patient not taking: Reported on 07/11/2023)   empagliflozin (JARDIANCE) 25 MG TABS tablet Take 1 tablet (25 mg total) by mouth daily before breakfast.   rosuvastatin (CRESTOR) 20 MG tablet Take 1 tablet (20 mg total) by mouth daily with supper.   Semaglutide, 1 MG/DOSE, 4 MG/3ML SOPN Inject 1 mg as directed once a week.   [DISCONTINUED] Glucose Blood (ACCU-CHEK AVIVA PLUS VI) by In Vitro route. (Patient not taking: Reported on 07/11/2023)   [DISCONTINUED] glucose blood test strip Check tid, one touch verio (Patient not taking: Reported on 07/11/2023)   [DISCONTINUED] loperamide (IMODIUM) 2 MG capsule Take 1 capsule (2 mg total) by mouth See admin instructions. With onset of diarrhea take 4 mg,the 2 mg every 2 hours (4 mg every 4 hours at night)  maximum: 16 mg/day (Patient not taking: Reported on 07/11/2023)   No facility-administered encounter medications on file as of 07/11/2023.    Past Medical History:  Diagnosis Date   Arthritis    hands, left knee   Cancer (HCC)    COPD (chronic obstructive pulmonary disease) (HCC)    Diabetes mellitus without complication (HCC)    Eczema 01/08/2017   GERD (gastroesophageal reflux disease)    History of kidney cancer    Hyperlipidemia LDL goal <100 11/02/2014   Hypertension    Kidney stone    YEAR H/O HEMATURIA   Psoriasis    Wears dentures    full upper, partial lower.  Has, does not wear    Past Surgical History:  Procedure Laterality Date   BRONCHIAL NEEDLE ASPIRATION BIOPSY  05/03/2023   Procedure: BRONCHIAL NEEDLE ASPIRATION BIOPSIES;  Surgeon: Janann Colonel, MD;  Location: [facility];  Service: Pulmonary;;   COLONOSCOPY N/A 10/21/2021   Procedure: COLONOSCOPY;  Surgeon: Midge Minium, MD;  Location: [facility];  Service: Endoscopy;  Laterality: N/A;   COLONOSCOPY WITH PROPOFOL N/A 02/23/2017   Procedure: COLONOSCOPY WITH PROPOFOL;  Surgeon: Midge Minium, MD;  Location: [facility];  Service: Endoscopy;  Laterality: N/A;  Diabetic - oral meds   CYST EXCISION  12/2014   on back   CYSTOSCOPY W/ RETROGRADES Right 06/28/2018   Procedure: CYSTOSCOPY WITH RETROGRADE PYELOGRAM;  Surgeon: Sondra Come, MD;  Location: [facility];  Service: Urology;  Laterality: Right;   CYSTOSCOPY W/ URETERAL STENT REMOVAL Right 07/22/2018   Procedure: CYSTOSCOPY WITH STENT REMOVAL;  Surgeon: Sondra Come, MD;  Location: [facility];  Service: Urology;  Laterality: Right;   CYSTOSCOPY WITH BIOPSY Right 06/28/2018   Procedure: CYSTOSCOPY WITH RENAL BIOPSY;  Surgeon: Sondra Come, MD;  Location: [facility];  Service: Urology;  Laterality: Right;   CYSTOSCOPY WITH URETEROSCOPY AND STENT PLACEMENT Right 06/28/2018   Procedure: CYSTOSCOPY WITH URETEROSCOPY AND STENT PLACEMENT;  Surgeon: Sondra Come, MD;  Location: [facility];  Service: Urology;  Laterality: Right;   ESOPHAGOGASTRODUODENOSCOPY N/A 10/21/2021   Procedure: ESOPHAGOGASTRODUODENOSCOPY (EGD);  Surgeon: Midge Minium, MD;  Location: [facility];  Service: Endoscopy;  Laterality: N/A;   FULGURATION OF BLADDER TUMOR N/A 06/28/2018   Procedure: CYSTOSCOPY BLADDER BIOPSY, FULGERATION OF BLADDER;  Surgeon: Sondra Come, MD;  Location: [facility];  Service: Urology;  Laterality: N/A;   JOINT REPLACEMENT Left    KNEE SURGERY Left    LAPAROSCOPIC NEPHRECTOMY, HAND ASSISTED Right 07/22/2018   Procedure: HAND ASSISTED LAPAROSCOPIC NEPHRECTOMY;  Surgeon: Sondra Come, MD;  Location: [facility];  Service: Urology;  Laterality: Right;   NASAL SINUS SURGERY  07/2013   POLYPECTOMY  N/A 02/23/2017   Procedure: POLYPECTOMY;  Surgeon: Midge Minium, MD;  Location: [facility];  Service: Endoscopy;  Laterality: N/A;   POLYPECTOMY  10/21/2021   Procedure: POLYPECTOMY;  Surgeon: Midge Minium, MD;  Location: [facility];  Service: Endoscopy;;   REPLACEMENT TOTAL KNEE Right    tooth abstraction     TRANSURETHRAL RESECTION OF BLADDER TUMOR N/A 05/12/2019   Procedure: TRANSURETHRAL RESECTION OF BLADDER TUMOR (TURBT);  Surgeon: Sondra Come, MD;  Location: [facility];  Service: Urology;  Laterality: N/A;   VARICOCELE EXCISION     VIDEO BRONCHOSCOPY WITH ENDOBRONCHIAL ULTRASOUND N/A 05/03/2023   Procedure: VIDEO BRONCHOSCOPY WITH ENDOBRONCHIAL ULTRASOUND;  Surgeon: Janann Colonel, MD;  Location: [facility];  Service: Pulmonary;  Laterality: N/A;   XI ROBOTIC ASSISTED VENTRAL HERNIA N/A 11/18/2020   Procedure: XI ROBOTIC ASSISTED VENTRAL HERNIA, incisional;  Surgeon: Leafy Ro, MD;  Location: [facility];  Service: General;  Laterality: N/A;    Family History  Problem Relation Age of Onset   Heart disease Father    Hypertension Father    COPD Father    Colon cancer Maternal Grandmother    Heart attack Paternal Grandfather     Social History   Socioeconomic History   Marital status: Married    Spouse name: Isabelle Course   Number of children: Not on file   Years of education: Not on file   Highest education level: Not on file  Occupational History   Not on file  Tobacco Use   Smoking status: Former    Current packs/day: 0.00    Types: Cigarettes    Quit date: 06/15/2013    Years since quitting: 10.0   Smokeless tobacco: Never  Vaping Use   Vaping status: Never Used  Substance and Sexual Activity   Alcohol use: No    Alcohol/week: 0.0 standard drinks of alcohol   Drug use: Yes    Types: Marijuana   Sexual activity: Yes  Other Topics Concern   Not on file  Social History Narrative   Lives with wife, sister in law   Social Drivers of Health    Financial Resource Strain: Low Risk  (09/13/2021)   Received from [facility], [facility]   Overall Financial Resource Strain (CARDIA)    Difficulty of Paying Living Expenses: Not hard at all  Food Insecurity: No Food Insecurity (09/13/2021)   Received [facility], [facility]  Hunger Vital Sign    Worried About Running Out of Food in the Last Year: Never true    Ran Out of Food in the Last Year: Never true  Transportation Needs: No Transportation Needs (09/13/2021)   Received from [facility], [facility]   [facility] - Transportation    Lack of Transportation (Medical): No    Lack of Transportation (Non-Medical): No  Physical Activity: Not on file  Stress: Not on file  Social Connections: Not on file  Intimate Partner Violence: Not on file    Review of Systems  Constitutional:  Negative for chills and fever.  Eyes:  Negative for blurred vision.  Respiratory:  Negative for sputum production.   Cardiovascular:  Negative for chest pain.  Neurological:  Negative for headaches.        Objective    BP 124/80 (Cuff Size: Large)   Pulse 94   Temp 97.7 F (36.5 C) (Oral)   Resp 16   Ht 6' (1.829 m)   Wt 201 lb 11.2 oz (91.5 kg)   SpO2 96%   BMI 27.36 kg/m   Physical Exam Constitutional:      Appearance: Normal appearance.  HENT:     Head: Normocephalic and atraumatic.  Eyes:     Conjunctiva/sclera: Conjunctivae normal.  Cardiovascular:     Rate and Rhythm: Normal rate and regular rhythm.     Pulses:          Dorsalis pedis pulses are 2+ on the right side and 2+ on the left side.  Pulmonary:     Effort: Pulmonary effort is normal.     Breath sounds: Normal breath sounds.  Musculoskeletal:     Right foot: Normal range of motion. No deformity, bunion, Charcot foot, foot drop or prominent metatarsal heads.     Left foot: Normal range of motion. No deformity, bunion, Charcot foot, foot drop or prominent metatarsal heads.  Feet:     Right  foot:     Protective Sensation: 6 sites tested.  6 sites sensed.     Skin integrity: Skin integrity normal.     Toenail Condition: Right toenails are normal.     Left foot:     Protective Sensation: 6 sites tested.  6 sites sensed.     Skin integrity: Skin integrity normal.     Toenail Condition: Left toenails are normal.  Skin:    General: Skin is warm and dry.  Neurological:     General: No focal deficit present.     Mental Status: He is alert. Mental status is at baseline.  Psychiatric:        Mood and Affect: Mood normal.        Behavior: Behavior normal.         Assessment & Plan:   Type 2 diabetes mellitus with hyperglycemia, without long-term current use of insulin (HCC) Assessment & Plan: A1c controlled at 7.1%, can decrease Metformin further to 1000 mg once daily, Jardiance and Ozempic. Foot exam and microalbumin today.   Orders: -     POCT glycosylated hemoglobin (Hb A1C) -     Microalbumin / creatinine urine ratio -     HM Diabetes Foot Exam -     Empagliflozin; Take 1 tablet (25 mg total) by mouth daily before breakfast.  Dispense: 90 tablet; Refill: 1 -     Semaglutide (1 MG/DOSE); Inject 1 mg as directed once a week.  Dispense: 3 mL; Refill: 2  Benign essential HTN  Assessment & Plan: Increased Lisinopril to 20 mg daily.   Orders: -     Lisinopril; Take 1 tablet (20 mg total) by mouth daily.  Dispense: 90 tablet; Refill: 3  Hyperlipidemia LDL goal <100 Assessment & Plan: Stable on statin, refilled.   Orders: -     Rosuvastatin Calcium; Take 1 tablet (20 mg total) by mouth daily with supper.  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 Cathie Hoops soon to discuss options going forward.      Return in about 3 months (around 10/08/2023).   Margarita Mail, DO

## 2023-07-12 LAB — MICROALBUMIN / CREATININE URINE RATIO
Creatinine, Urine: 107 mg/dL (ref 20–320)
Microalb Creat Ratio: 11 mg/g{creat} (ref <30)
Microalb, Ur: 1.2 mg/dL

## 2023-07-13 ENCOUNTER — Encounter: Payer: Self-pay | Admitting: Oncology

## 2023-07-13 ENCOUNTER — Inpatient Hospital Stay: Payer: 59

## 2023-07-13 ENCOUNTER — Inpatient Hospital Stay: Payer: 59 | Admitting: Oncology

## 2023-07-13 VITALS — BP 126/89 | HR 88 | Temp 95.2°F | Resp 18 | Wt 203.7 lb

## 2023-07-13 DIAGNOSIS — L271 Localized skin eruption due to drugs and medicaments taken internally: Secondary | ICD-10-CM | POA: Diagnosis not present

## 2023-07-13 DIAGNOSIS — C641 Malignant neoplasm of right kidney, except renal pelvis: Secondary | ICD-10-CM

## 2023-07-13 DIAGNOSIS — T451X5A Adverse effect of antineoplastic and immunosuppressive drugs, initial encounter: Secondary | ICD-10-CM | POA: Diagnosis not present

## 2023-07-13 DIAGNOSIS — K521 Toxic gastroenteritis and colitis: Secondary | ICD-10-CM | POA: Diagnosis not present

## 2023-07-13 DIAGNOSIS — C7911 Secondary malignant neoplasm of bladder: Secondary | ICD-10-CM | POA: Diagnosis not present

## 2023-07-13 LAB — CMP (CANCER CENTER ONLY)
ALT: 34 U/L (ref 0–44)
AST: 29 U/L (ref 15–41)
Albumin: 3.7 g/dL (ref 3.5–5.0)
Alkaline Phosphatase: 72 U/L (ref 38–126)
Anion gap: 8 (ref 5–15)
BUN: 19 mg/dL (ref 6–20)
CO2: 26 mmol/L (ref 22–32)
Calcium: 8.7 mg/dL — ABNORMAL LOW (ref 8.9–10.3)
Chloride: 101 mmol/L (ref 98–111)
Creatinine: 1.06 mg/dL (ref 0.61–1.24)
GFR, Estimated: >60 mL/min (ref >60)
Glucose, Bld: 132 mg/dL — ABNORMAL HIGH (ref 70–99)
Potassium: 4.1 mmol/L (ref 3.5–5.1)
Sodium: 135 mmol/L (ref 135–145)
Total Bilirubin: 1 mg/dL (ref 0.0–1.2)
Total Protein: 6.4 g/dL — ABNORMAL LOW (ref 6.5–8.1)

## 2023-07-13 LAB — CBC WITH DIFFERENTIAL (CANCER CENTER ONLY)
Abs Immature Granulocytes: 0.01 10*3/uL (ref 0.00–0.07)
Basophils Absolute: 0 10*3/uL (ref 0.0–0.1)
Basophils Relative: 0 %
Eosinophils Absolute: 0.4 10*3/uL (ref 0.0–0.5)
Eosinophils Relative: 7 %
HCT: 45.1 % (ref 39.0–52.0)
Hemoglobin: 15.1 g/dL (ref 13.0–17.0)
Immature Granulocytes: 0 %
Lymphocytes Relative: 42 %
Lymphs Abs: 2.7 10*3/uL (ref 0.7–4.0)
MCH: 29.5 pg (ref 26.0–34.0)
MCHC: 33.5 g/dL (ref 30.0–36.0)
MCV: 88.3 fL (ref 80.0–100.0)
Monocytes Absolute: 0.4 10*3/uL (ref 0.1–1.0)
Monocytes Relative: 6 %
Neutro Abs: 2.8 10*3/uL (ref 1.7–7.7)
Neutrophils Relative %: 45 %
Platelet Count: 136 10*3/uL — ABNORMAL LOW (ref 150–400)
RBC: 5.11 MIL/uL (ref 4.22–5.81)
RDW: 14.6 % (ref 11.5–15.5)
WBC Count: 6.3 10*3/uL (ref 4.0–10.5)
nRBC: 0 % (ref 0.0–0.2)

## 2023-07-14 DIAGNOSIS — L271 Localized skin eruption due to drugs and medicaments taken internally: Secondary | ICD-10-CM | POA: Insufficient documentation

## 2023-07-14 NOTE — Assessment & Plan Note (Addendum)
 Grade 2 Improved since stop of chemotherapy. Continue emollient creams or lotions containing urea topically

## 2023-07-14 NOTE — Assessment & Plan Note (Signed)
 Recommend imodium PRN as directed.

## 2023-07-14 NOTE — Progress Notes (Signed)
 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  Recent CT showed thoracic lymphadenopathy which were biopsy proven recurrent metastatic RCC 2nd line treatment with cabozantinib 60mg  daily He has developed more toxicities and not able to tolerate.  Recommend 3 weeks treatment break and once all symptoms resolves, I recommend dose reduction to 40mg  daily.      Chemotherapy induced diarrhea Recommend imodium PRN as directed.   Palmar plantar erythrodysesthesia Improved since stop of chemotherapy. Continue emollient creams or lotions containing urea topically   No orders of the defined types were placed in this encounter.  Follow up: I will see patient 2 weeks after starting on 40mg  dosage All questions were answered. The patient knows to call the clinic with any problems, questions or concerns.  Rickard Patience, MD, PhD [facility] Hematology Oncology 07/13/2023     HISTORY OF PRESENTING ILLNESS:   Matthew Woodard is a  58 y.o.  male presents for follow up for metastatic kidney cancer Oncology History  Metastatic renal cell carcinoma (HCC)  06/2018 Imaging   CT urogram done which showed right 4 cm central enhancing renal mass with invasion into the collecting system. X-ray of chest was performed to see complete staging which showed no concerning findings   06/28/2018 Cancer Diagnosis      06/28/2018 Procedure    patient underwent a cystoscopy, bladder biopsy and a right retrograde pyelogram with intraoperative interpretation, right diagnostic ureteroscopy, right renal pelvis biopsy, right ureteral stent placement.  biopsy showed atypical cell clusters, with extensive crush artifact, nondiagnostic.      07/22/2018 Initial Diagnosis   Patient underwent right radical laparoscopic nephrectomy with biopsy showing 5.3 cm RCC, clear cell type, grade 3, tumor invades renal vein and segmental branches, pelvic calyceal system and perirenal sinus/fat.  Surgical margins negative for tumor.pT3a,Nx Post-operation, patient was on surveillance after surgery.   10/28/2018 Imaging   CT abdomen pelvis with contrast showed minimal fluid and or postoperative changes within the right renal fossa.  No evidence of metastatic disease or recurrence.  Patient has left kidney lesion previously characterized as nonenhancing cyst by multi phasic contrast-enhanced CT.   04/30/2019 Progression   Patient reports an episode of gross hematuria in early December 2020, patient is due for 88-month surveillance CT scan. 04/30/2019 CT abdomen pelvis with contrast showed high attenuation mass at the right uretero vesicle junction, with extension into the bladder.  Bilateral pulmonary nodules, most indicated for metastatic disease.  Patient underwent cystoscopy and transurethral resection of irregular bladder tumor 3 cm.  Mass appeared to emanate from the right ureteral orifice.  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  #NGS: [employer] PD L1 TPS 1%   05/28/2019 Imaging   CT chest w contrast 1. Numerous bilateral pulmonary nodules measuring up to 10 mm diameter. Imaging features compatible with metastatic disease. 2. Left hilar lymphadenopathy, also highly suspicious for metastatic involvement.    05/28/2019 Imaging   Bone scan showed  No scintigraphic evidence of osseous metastatic disease.    06/05/2019 - 06/21/2021 Chemotherapy   06/05/19 Axitinib 5mg  BID+ Keytruda  09/2019 off axitinib and Keytruda since the beginning of May due to transaminitis. GI work up negative.  Ultrasound liver vascular Doppler is negative #11/24/2019, resumed on Keytruda  every 3 weeks #01/05/2020, axitinib was  resumed 3 mg #01/20/2020, axitinib was discontinued due to recurrence of transaminitis.  [name] was temporarily held. #03/16/2020-06/21/2021 resumed on Keytruda every 3 weeks [name] was discontinued due to recurrent immunotherapy induced diarrhea    07/25/2019 Imaging   Brain w wo contrast No mass, hemorrhage, or abnormal enhancement.   09/29/2020 Imaging   CT scan showed no evidence of local recurrence or new/progressive disease in the chest abdomen or pelvis.  Tiny nonspecific lung nodules are unchanged.  No new lung nodules   01/14/2021 Imaging   CT chest abdomen pelvis showed stable exam, no new or progressive metastatic disease within chest abdomen pelvis. Stable lung nodules. Aortic atherosclerosis.    04/15/2021 Imaging   CT chest abdomen pelvis with contrast showed stable examination.  No evidence of recurrent or metastatic carcinoma within the chest abdomen pelvis.     08/02/2021 Imaging   CT chest abdomen pelvis without contrast, stable examination without evidence of recurrence or metastatic disease.  Mild asymmetric diffuse esophageal wall thickening, similar to prior examination.  Likely chronic esophagitis.  Aortic atherosclerosis   10/21/2021 Procedure   colonoscopy showed 3 mm polyp which was resected and retrieved.  Negative for dysplasia/malignancy.  Random biopsy showed lymphocytic colitis. EGD showed long segment Barrett's esophagus.  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  Negative for dysplasia and malignancy.  Small bowel cold biopsy showed reactive duodenitis.  Negative for CMV.  Negative for dysplasia or malignancy.   11/16/2021 Imaging   CT chest abdomen pelvis without contrast showed stable examination without evidence of local recurrence or active malignant disease in the chest abdomen pelvis.  Reflux retained contrast in the esophagus suggestive of esophagitis.  Aortic atherosclerosis   07/03/2022 Imaging   CT chest abdomen  pelvis w contrast   1. Stable examination status post right nephrectomy without evidence of new or progressive disease in the chest, abdomen or pelvis. 2. Stable scattered tiny pulmonary nodules favored benign etiology. 3.  Aortic Atherosclerosis    01/04/2023 Imaging   CT chest abdomen pelvis wo contrast showed  1. New clustered nodularity and ground-glass in the peripheral right lower lobe, likely infectious/inflammatory. Additionally there are new enlarged mediastinal lymph nodes which given the above findings are favored reactive but nonspecific. Suggest attention on short-term interval follow-up dedicated chest CT to ensure resolution. 2. Stable scattered tiny pulmonary nodules. 3. No evidence of local recurrence or metastatic disease in the abdomen or pelvis. 4.  Aortic Atherosclerosis   04/09/2023 Imaging   CT chest wo contrast  1. Persistent right paratracheal and subcarinal adenopathy. These are stable to slightly increased in the interval (on the order of 1-2 mm). 2. Previously noted left upper lobe and right upper lobe lung nodules are not significantly changed in the interval. 3. New part solid nodule within the anterior left upper lobe measures 6 mm. The appearance is nonspecific. This may be postinflammatory or infectious in etiology. Consider short-term interval follow-up with repeat CT of the chest in 3 months to ensure resolution. 4. Stable appearance of focal area of ground-glass attenuation and interstitial prominence within the periphery of the right upper lobe. Favor sequelae of inflammation or infection 5. Coronary artery calcifications. 6.  Aortic Atherosclerosis    05/03/2023 Relapse/Recurrence   Biopsy via bronchoscopy  A. LYMPH NODE, 4R, FINE NEEDLE ASPIRATION:  - Carcinoma, consistent with renal cell carcinoma  - No definitive lymphoid tissue identified   B. LYMPH NODE, 7, FINE NEEDLE ASPIRATION:  -  Carcinoma, consistent with renal cell  carcinoma  - Lymphoid tissue identified   C. LYMPH NODE, 11R, FINE NEEDLE ASPIRATION:  - Carcinoma, consistent with renal cell carcinoma  - Lymphoid tissue identified    05/30/2023 -  Chemotherapy   Cabozantinib 60mg  daily   Clear cell carcinoma of right kidney (HCC)     #History of cutaneous psoriasis, mostly on his right hand,    INTERVAL HISTORY Matthew Woodard is a 58 y.o. male who has above history reviewed by me today presents for follow up visit for management of metastatic RCC. Patient reports doing well.Accompanied by his wife.  + Chronic cough, stable He has been on cabozantinib 60mg  daily since 05/30/2023, and stopped on 07/09/2023 as advised due to mucositis, and pain and tingling in his feet, affecting walking.   Review of Systems  Constitutional:  Negative for appetite change, chills, fatigue, fever and unexpected weight change.  HENT:   Negative for hearing loss and voice change.   Eyes:  Negative for eye problems and icterus.  Respiratory:  Positive for cough. Negative for chest tightness and shortness of breath.   Cardiovascular:  Negative for chest pain and leg swelling.  Gastrointestinal:  Negative for abdominal distention, abdominal pain, blood in stool, diarrhea and nausea.  Endocrine: Negative for hot flashes.  Genitourinary:  Negative for difficulty urinating, dysuria and frequency.   Musculoskeletal:        Bilateral knee replacement.   Skin:  Negative for itching and rash.  Neurological:  Negative for extremity weakness, headaches, light-headedness and numbness.  Hematological:  Negative for adenopathy. Does not bruise/bleed easily.  Psychiatric/Behavioral:  Negative for confusion.     MEDICAL HISTORY:  Past Medical History:  Diagnosis Date   Arthritis    hands, left knee   Cancer (HCC)    COPD (chronic obstructive pulmonary disease) (HCC)    Diabetes mellitus without complication (HCC)    Eczema 01/08/2017   GERD (gastroesophageal reflux  disease)    History of kidney cancer    Hyperlipidemia LDL goal <100 11/02/2014   Hypertension    Kidney stone    YEAR H/O HEMATURIA   Psoriasis    Wears dentures    full upper, partial lower.  Has, does not wear    SURGICAL HISTORY: Past Surgical History:  Procedure Laterality Date   BRONCHIAL NEEDLE ASPIRATION BIOPSY  05/03/2023   Procedure: BRONCHIAL NEEDLE ASPIRATION BIOPSIES;  Surgeon: Janann Colonel, MD;  Location: [facility];  Service: Pulmonary;;   COLONOSCOPY N/A 10/21/2021   Procedure: COLONOSCOPY;  Surgeon: Midge Minium, MD;  Location: [facility];  Service: Endoscopy;  Laterality: N/A;   COLONOSCOPY WITH PROPOFOL N/A 02/23/2017   Procedure: COLONOSCOPY WITH PROPOFOL;  Surgeon: Midge Minium, MD;  Location: [facility];  Service: Endoscopy;  Laterality: N/A;  Diabetic - oral meds   CYST EXCISION  12/2014   on back   CYSTOSCOPY W/ RETROGRADES Right 06/28/2018   Procedure: CYSTOSCOPY WITH RETROGRADE PYELOGRAM;  Surgeon: Sondra Come, MD;  Location: [facility];  Service: Urology;  Laterality: Right;   CYSTOSCOPY W/ URETERAL STENT REMOVAL Right 07/22/2018   Procedure: CYSTOSCOPY WITH STENT REMOVAL;  Surgeon: Sondra Come, MD;  Location: [facility];  Service: Urology;  Laterality: Right;   CYSTOSCOPY WITH BIOPSY Right 06/28/2018   Procedure: CYSTOSCOPY WITH RENAL BIOPSY;  Surgeon: Sondra Come, MD;  Location: [facility];  Service: Urology;  Laterality: Right;   CYSTOSCOPY WITH URETEROSCOPY AND STENT PLACEMENT Right 06/28/2018  Procedure: CYSTOSCOPY WITH URETEROSCOPY AND STENT PLACEMENT;  Surgeon: Sondra Come, MD;  Location: [facility];  Service: Urology;  Laterality: Right;   ESOPHAGOGASTRODUODENOSCOPY N/A 10/21/2021   Procedure: ESOPHAGOGASTRODUODENOSCOPY (EGD);  Surgeon: Midge Minium, MD;  Location: [facility];  Service: Endoscopy;  Laterality: N/A;   FULGURATION OF BLADDER TUMOR N/A 06/28/2018   Procedure: CYSTOSCOPY BLADDER BIOPSY,  FULGERATION OF BLADDER;  Surgeon: Sondra Come, MD;  Location: [facility];  Service: Urology;  Laterality: N/A;   JOINT REPLACEMENT Left    KNEE SURGERY Left    LAPAROSCOPIC NEPHRECTOMY, HAND ASSISTED Right 07/22/2018   Procedure: HAND ASSISTED LAPAROSCOPIC NEPHRECTOMY;  Surgeon: Sondra Come, MD;  Location: [facility];  Service: Urology;  Laterality: Right;   NASAL SINUS SURGERY  07/2013   POLYPECTOMY N/A 02/23/2017   Procedure: POLYPECTOMY;  Surgeon: Midge Minium, MD;  Location: [facility];  Service: Endoscopy;  Laterality: N/A;   POLYPECTOMY  10/21/2021   Procedure: POLYPECTOMY;  Surgeon: Midge Minium, MD;  Location: [facility];  Service: Endoscopy;;   REPLACEMENT TOTAL KNEE Right    tooth abstraction     TRANSURETHRAL RESECTION OF BLADDER TUMOR N/A 05/12/2019   Procedure: TRANSURETHRAL RESECTION OF BLADDER TUMOR (TURBT);  Surgeon: Sondra Come, MD;  Location: [facility];  Service: Urology;  Laterality: N/A;   VARICOCELE EXCISION     VIDEO BRONCHOSCOPY WITH ENDOBRONCHIAL ULTRASOUND N/A 05/03/2023   Procedure: VIDEO BRONCHOSCOPY WITH ENDOBRONCHIAL ULTRASOUND;  Surgeon: Janann Colonel, MD;  Location: [facility];  Service: Pulmonary;  Laterality: N/A;   XI ROBOTIC ASSISTED VENTRAL HERNIA N/A 11/18/2020   Procedure: XI ROBOTIC ASSISTED VENTRAL HERNIA, incisional;  Surgeon: Leafy Ro, MD;  Location: [facility];  Service: General;  Laterality: N/A;    SOCIAL HISTORY: Social History   Socioeconomic History   Marital status: Married    Spouse name: Isabelle Course   Number of children: Not on file   Years of education: Not on file   Highest education level: Not on file  Occupational History   Not on file  Tobacco Use   Smoking status: Former    Current packs/day: 0.00    Types: Cigarettes    Quit date: 06/15/2013    Years since quitting: 10.0   Smokeless tobacco: Never  Vaping Use   Vaping status: Never Used  Substance and Sexual Activity   Alcohol use: No     Alcohol/week: 0.0 standard drinks of alcohol   Drug use: Yes    Types: Marijuana   Sexual activity: Yes  Other Topics Concern   Not on file  Social History Narrative   Lives with wife, sister in law   Social Drivers of Health   Financial Resource Strain: Low Risk  (09/13/2021)   Received [facility], [facility]   Overall Financial Resource Strain (CARDIA)    Difficulty of Paying Living Expenses: Not hard at all  Food Insecurity: No Food Insecurity (09/13/2021)   Received [facility], [facility]   Hunger Vital Sign    Worried About Running Out of Food in the Last Year: Never true    Ran Out of Food in the Last Year: Never true  Transportation Needs: No Transportation Needs (09/13/2021)   Received from [facility], [facility]   [facility] - Transportation    Lack of Transportation (Medical): No    Lack of Transportation (Non-Medical): No  Physical Activity: Not on file  Stress: Not on file  Social  Connections: Not on file  Intimate Partner Violence: Not on file    FAMILY HISTORY: Family History  Problem Relation Age of Onset   Heart disease Father    Hypertension Father    COPD Father    Colon cancer Maternal Grandmother    Heart attack Paternal Grandfather     ALLERGIES:  is allergic to axitinib, propofol, and glucotrol [glipizide].  MEDICATIONS:  Current Outpatient Medications  Medication Sig Dispense Refill   albuterol (VENTOLIN HFA) 108 (90 Base) MCG/ACT inhaler Inhale 2 puffs into the lungs every 6 (six) hours as needed for wheezing or shortness of breath. 8 g 2   ALPRAZolam (XANAX) 0.5 MG tablet Take 1 tablet (0.5 mg total) by mouth 2 (two) times daily as needed for anxiety or sleep. 60 tablet 0   aspirin 81 MG EC tablet Take 81 mg by mouth daily with supper.     clobetasol ointment (TEMOVATE) 0.05 % Apply 1 Application topically 2 (two) times daily as needed. 30 g 0   empagliflozin (JARDIANCE) 25 MG TABS tablet Take 1 tablet (25  mg total) by mouth daily before breakfast. 90 tablet 1   fluticasone (FLONASE) 50 MCG/ACT nasal spray Place 2 sprays into both nostrils daily. 16 g 1   lisinopril (ZESTRIL) 20 MG tablet Take 1 tablet (20 mg total) by mouth daily. 90 tablet 3   magic mouthwash (multi-ingredient) oral suspension Take 5 ml by mouth 4 (four) times daily as needed for mouth pain. 480 mL 2   metFORMIN (GLUCOPHAGE-XR) 500 MG 24 hr tablet Take 2 tablets (1,000 mg total) by mouth 2 (two) times daily with a meal. 180 tablet 3   Multiple Vitamin (MULTIVITAMIN) tablet Take 1 tablet by mouth daily with supper.     omeprazole (PRILOSEC) 20 MG capsule TAKE 1 CAPSULE DAILY AS    NEEDED FOR ACID REFLUX 90 capsule 0   ondansetron (ZOFRAN) 8 MG tablet Take 1 tablet (8 mg total) by mouth every 8 (eight) hours as needed for nausea or vomiting. 20 tablet 1   prochlorperazine (COMPAZINE) 10 MG tablet Take 1 tablet (10 mg total) by mouth every 6 (six) hours as needed. 30 tablet 1   rosuvastatin (CRESTOR) 20 MG tablet Take 1 tablet (20 mg total) by mouth daily with supper. 90 tablet 1   Semaglutide, 1 MG/DOSE, 4 MG/3ML SOPN Inject 1 mg as directed once a week. 3 mL 2   umeclidinium-vilanterol (ANORO ELLIPTA) 62.5-25 MCG/ACT AEPB Inhale 1 puff into the lungs daily. 30 each 3   CABOMETYX 60 MG tablet TAKE 1 TABLET BY MOUTH 1 TIME A DAY ON AN EMPTY STOMACH, 1 HOUR BEFORE OR 2 HOURS AFTER MEALS (Patient not taking: Reported on 07/13/2023) 30 tablet 0   No current facility-administered medications for this visit.     PHYSICAL EXAMINATION: ECOG PERFORMANCE STATUS: 1 - Symptomatic but completely ambulatory Vitals:   07/13/23 1126  BP: 126/89  Pulse: 88  Resp: 18  Temp: (!) 95.2 F (35.1 C)  SpO2: 98%   Filed Weights   07/13/23 1126  Weight: 203 lb 11.2 oz (92.4 kg)     Physical Exam Constitutional:      General: He is not in acute distress. HENT:     Head: Normocephalic.  Eyes:     General: No scleral  icterus. Cardiovascular:     Rate and Rhythm: Normal rate and regular rhythm.  Pulmonary:     Effort: Pulmonary effort is normal. No respiratory distress.  Breath sounds: No wheezing.  Abdominal:     General: Bowel sounds are normal.     Palpations: Abdomen is soft.  Musculoskeletal:        General: No deformity. Normal range of motion.     Cervical back: Normal range of motion and neck supple.     Comments: Bilateral knee replacement.   Skin:    General: Skin is warm and dry.     Findings: No erythema or rash.     Comments: Thickening of skin of soles, with scaling.   Neurological:     Mental Status: He is alert and oriented to person, place, and time. Mental status is at baseline.  Psychiatric:        Mood and Affect: Mood normal.     LABORATORY DATA:  I have reviewed the data as listed    Latest Ref Rng & Units 07/13/2023   10:54 AM 06/29/2023   10:40 AM 06/14/2023    8:41 AM  CBC  WBC 4.0 - 10.5 K/uL 6.3  6.7  7.3   Hemoglobin 13.0 - 17.0 g/dL 57.8  46.9  62.9   Hematocrit 39.0 - 52.0 % 45.1  49.1  45.1   Platelets 150 - 400 K/uL 136  170  209       Latest Ref Rng & Units 07/13/2023   10:54 AM 06/29/2023   10:40 AM 06/14/2023    8:41 AM  CMP  Glucose 70 - 99 mg/dL 528  413  244   BUN 6 - 20 mg/dL 19  19  19    Creatinine 0.61 - 1.24 mg/dL 0.10  2.72  5.36   Sodium 135 - 145 mmol/L 135  136  136   Potassium 3.5 - 5.1 mmol/L 4.1  4.3  4.0   Chloride 98 - 111 mmol/L 101  101  103   CO2 22 - 32 mmol/L 26  25  25    Calcium 8.9 - 10.3 mg/dL 8.7  9.4  8.9   Total Protein 6.5 - 8.1 g/dL 6.4  6.8  7.0   Total Bilirubin 0.0 - 1.2 mg/dL 1.0  0.8  0.5   Alkaline Phos 38 - 126 U/L 72  90  95   AST 15 - 41 U/L 29  39  31   ALT 0 - 44 U/L 34  41  27     RADIOGRAPHIC STUDIES: I have personally reviewed the radiological images as listed and agreed with the findings in the report.  MR Brain W Wo Contrast Result Date: 06/04/2023 CLINICAL DATA:  Renal cell carcinoma. EXAM:  MRI HEAD WITHOUT AND WITH CONTRAST TECHNIQUE: Multiplanar, multiecho pulse sequences of the brain and surrounding structures were obtained without and with intravenous contrast. CONTRAST:  9mL GADAVIST GADOBUTROL 1 MMOL/ML IV SOLN COMPARISON:  Head MRI 07/25/2019 FINDINGS: Brain: There is no evidence of an acute infarct, intracranial hemorrhage, mass, midline shift, or extra-axial fluid collection. Cerebral volume is within normal limits for age. The ventricles are normal in size. No significant white matter disease is evident. No abnormal enhancement is identified. A partially empty sella is unchanged. Vascular: Major intracranial vascular flow voids are preserved. Skull and upper cervical spine: Unremarkable bone marrow signal. Sinuses/Orbits: Unremarkable orbits. Paranasal sinuses and mastoid air cells are clear. Other: None. IMPRESSION: No evidence of intracranial metastases. Electronically Signed   By: Sebastian Ache M.D.   On: 06/04/2023 17:37   CT CHEST ABDOMEN PELVIS WO CONTRAST Result Date: 05/24/2023 CLINICAL DATA:  History of  renal cell carcinoma metastatic. Follow-up. * Tracking Code: BO * EXAM: CT CHEST, ABDOMEN AND PELVIS WITHOUT CONTRAST TECHNIQUE: Multidetector CT imaging of the chest, abdomen and pelvis was performed following the standard protocol without IV contrast. RADIATION DOSE REDUCTION: This exam was performed according to the departmental dose-optimization program which includes automated exposure control, adjustment of the mA and/or kV according to patient size and/or use of iterative reconstruction technique. COMPARISON:  Multiple priors including most recent CT April 09, 2023 FINDINGS: CT CHEST FINDINGS Cardiovascular: Scattered aortic atherosclerosis. Normal caliber central pulmonary arteries. Normal size heart. Coronary artery calcifications. No significant pericardial effusion/thickening. Mediastinum/Nodes: No suspicious thyroid nodule. Enlarged mediastinal lymph nodes are similar  prior.  For reference: -right paratracheal lymph node measures 14 mm in short axis on image 30/2 previously 16 mm. -subcarinal lymph node measures 16 mm in short axis on image 36/2 previously 17 mm. No pathologically enlarged hilar or axillary lymph nodes noting limited evaluation of the hilar structures on noncontrast enhanced examination. Patulous esophagus. Lungs/Pleura: Slightly increased clustered nodularity interposed within interstitial prominence and ground-glass in the periphery of the right lower lobe on image 92/4. Stable scattered pulmonary nodules.  For reference: -pulmonary nodule in the right lung apex measures 3 mm on image 46/4 previously 4 mm. -left upper lobe pulmonary nodule measures 3 mm on image 63/4, previously 4 mm. -part solid nodule in the anterior left upper lobe measures 5 mm on image 64/4 previously 6 mm. No new suspicious pulmonary nodules or masses. Musculoskeletal: No aggressive lytic or blastic lesion of bone. Intramuscular lipoma in the left paraspinal musculature image 39/2. CT ABDOMEN PELVIS FINDINGS Hepatobiliary: Unremarkable noncontrast enhanced appearance of the hepatic parenchyma. Gallbladder is unremarkable. No biliary ductal dilation. Pancreas: No pancreatic ductal dilation or evidence of acute inflammation. Spleen: No splenomegaly. Adrenals/Urinary Tract: Bilateral adrenal glands appear normal. Right kidney surgically absent without new suspicious nodularity in the nephrectomy bed. Stable soft tissue density along the posterior aspect of the surgical bed measuring 2.5 x 0.9 cm on image 72/2 stable dating back to Sep 29, 2020. Stable hyperdense 15 mm right renal lesion previously characterized as a hemorrhagic/proteinaceous renal cyst, although the lesion does not meet these criteria on today's examination measuring Hounsfield units of 58. Suggest attention on follow-up imaging. Urinary bladder is unremarkable for degree of distension. Stomach/Bowel: No radiopaque enteric  contrast material was administered. Stomach is unremarkable for degree of distension. No pathologic dilation of small or large bowel. Normal appendix. No evidence of acute bowel inflammation. Vascular/Lymphatic: Aortic atherosclerosis. Smooth IVC contours. No pathologically enlarged abdominal or pelvic lymph nodes. Reproductive: Prostate is unremarkable. Other: No significant abdominopelvic free fluid. Musculoskeletal: No aggressive lytic or blastic lesion of bone. IMPRESSION: 1. Slightly increased clustered nodularity interposed within interstitial prominence and ground-glass in the periphery of the right lower lobe. Suggest continued attention on short-term follow-up imaging to ensure resolution/stability. 2. Stable scattered additional tiny pulmonary nodules. 3. Stable enlarged mediastinal lymph nodes. 4. No evidence of recurrent or metastatic disease in the abdomen or pelvis. 5. Stable hyperdense 15 mm right renal lesion previously characterized as a hemorrhagic/proteinaceous renal cyst, although the lesion does not meet these criteria on today's examination measuring Hounsfield units of 58. Suggest attention on follow-up imaging. 6.  Aortic Atherosclerosis (ICD10-I70.0). Electronically Signed   By: Maudry Mayhew M.D.   On: 05/24/2023 15:58   DG Chest Port 1 View Result Date: 05/03/2023 CLINICAL DATA:  [id number] S/P bronchoscopy [id number] EXAM: PORTABLE CHEST 1 VIEW COMPARISON:  April 30, 2019,  April 09, 2023 FINDINGS: The cardiomediastinal silhouette is unchanged in contour. No pleural effusion. No significant pneumothorax. No acute pleuroparenchymal abnormality. IMPRESSION: No significant pneumothorax. Electronically Signed   By: Meda Klinefelter M.D.   On: 05/03/2023 17:08

## 2023-07-14 NOTE — Assessment & Plan Note (Signed)
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  Recent CT showed thoracic lymphadenopathy which were biopsy proven recurrent metastatic RCC 2nd line treatment with cabozantinib 60mg  daily He has developed more toxicities and not able to tolerate.  Recommend 3 weeks treatment break and once all symptoms resolves, I recommend dose reduction to 40mg  daily.

## 2023-07-16 ENCOUNTER — Other Ambulatory Visit: Payer: Self-pay | Admitting: Pharmacist

## 2023-07-16 ENCOUNTER — Encounter: Payer: Self-pay | Admitting: Pharmacist

## 2023-07-16 DIAGNOSIS — C641 Malignant neoplasm of right kidney, except renal pelvis: Secondary | ICD-10-CM

## 2023-07-16 MED ORDER — CABOMETYX 40 MG PO TABS: 40.0000 mg | ORAL_TABLET | Freq: Every day | ORAL | 0 refills | Status: DC

## 2023-07-18 ENCOUNTER — Other Ambulatory Visit: Payer: 59

## 2023-07-18 ENCOUNTER — Ambulatory Visit: Payer: 59 | Admitting: Oncology

## 2023-07-24 ENCOUNTER — Encounter: Payer: Self-pay | Admitting: Internal Medicine

## 2023-08-02 ENCOUNTER — Other Ambulatory Visit: Payer: Self-pay | Admitting: Oncology

## 2023-08-02 ENCOUNTER — Encounter: Payer: Self-pay | Admitting: Pulmonary Disease

## 2023-08-02 ENCOUNTER — Ambulatory Visit: Payer: 59 | Admitting: Pulmonary Disease

## 2023-08-02 DIAGNOSIS — C641 Malignant neoplasm of right kidney, except renal pelvis: Secondary | ICD-10-CM

## 2023-08-02 DIAGNOSIS — R0602 Shortness of breath: Secondary | ICD-10-CM

## 2023-08-03 MED ORDER — UMECLIDINIUM-VILANTEROL 62.5-25 MCG/ACT IN AEPB: 1.0000 | INHALATION_SPRAY | Freq: Every day | RESPIRATORY_TRACT | 3 refills | Status: DC

## 2023-08-10 ENCOUNTER — Inpatient Hospital Stay: Admitting: Oncology

## 2023-08-10 ENCOUNTER — Encounter: Payer: Self-pay | Admitting: Oncology

## 2023-08-10 ENCOUNTER — Encounter: Payer: Self-pay | Admitting: Pulmonary Disease

## 2023-08-10 ENCOUNTER — Inpatient Hospital Stay: Attending: Oncology

## 2023-08-10 VITALS — BP 118/81 | HR 83 | Temp 95.1°F | Resp 18 | Ht 72.0 in | Wt 201.1 lb

## 2023-08-10 DIAGNOSIS — C641 Malignant neoplasm of right kidney, except renal pelvis: Secondary | ICD-10-CM | POA: Insufficient documentation

## 2023-08-10 DIAGNOSIS — Z87891 Personal history of nicotine dependence: Secondary | ICD-10-CM | POA: Diagnosis not present

## 2023-08-10 DIAGNOSIS — Z5111 Encounter for antineoplastic chemotherapy: Secondary | ICD-10-CM

## 2023-08-10 DIAGNOSIS — Z79899 Other long term (current) drug therapy: Secondary | ICD-10-CM | POA: Diagnosis not present

## 2023-08-10 DIAGNOSIS — C7911 Secondary malignant neoplasm of bladder: Secondary | ICD-10-CM | POA: Insufficient documentation

## 2023-08-10 DIAGNOSIS — L271 Localized skin eruption due to drugs and medicaments taken internally: Secondary | ICD-10-CM | POA: Diagnosis not present

## 2023-08-10 DIAGNOSIS — C7800 Secondary malignant neoplasm of unspecified lung: Secondary | ICD-10-CM | POA: Insufficient documentation

## 2023-08-10 DIAGNOSIS — Z800 Family history of malignant neoplasm of digestive organs: Secondary | ICD-10-CM | POA: Diagnosis not present

## 2023-08-10 DIAGNOSIS — C771 Secondary and unspecified malignant neoplasm of intrathoracic lymph nodes: Secondary | ICD-10-CM | POA: Diagnosis present

## 2023-08-10 DIAGNOSIS — Z9221 Personal history of antineoplastic chemotherapy: Secondary | ICD-10-CM | POA: Insufficient documentation

## 2023-08-10 LAB — CBC WITH DIFFERENTIAL/PLATELET
Abs Immature Granulocytes: 0.02 10*3/uL (ref 0.00–0.07)
Basophils Absolute: 0 10*3/uL (ref 0.0–0.1)
Basophils Relative: 1 %
Eosinophils Absolute: 0.1 10*3/uL (ref 0.0–0.5)
Eosinophils Relative: 1 %
HCT: 46.2 % (ref 39.0–52.0)
Hemoglobin: 15.4 g/dL (ref 13.0–17.0)
Immature Granulocytes: 0 %
Lymphocytes Relative: 34 %
Lymphs Abs: 2.3 10*3/uL (ref 0.7–4.0)
MCH: 30.4 pg (ref 26.0–34.0)
MCHC: 33.3 g/dL (ref 30.0–36.0)
MCV: 91.3 fL (ref 80.0–100.0)
Monocytes Absolute: 0.4 10*3/uL (ref 0.1–1.0)
Monocytes Relative: 6 %
Neutro Abs: 3.9 10*3/uL (ref 1.7–7.7)
Neutrophils Relative %: 58 %
Platelets: 218 10*3/uL (ref 150–400)
RBC: 5.06 MIL/uL (ref 4.22–5.81)
RDW: 15 % (ref 11.5–15.5)
WBC: 6.7 10*3/uL (ref 4.0–10.5)
nRBC: 0 % (ref 0.0–0.2)

## 2023-08-10 LAB — COMPREHENSIVE METABOLIC PANEL WITH GFR
ALT: 30 U/L (ref 0–44)
AST: 30 U/L (ref 15–41)
Albumin: 3.9 g/dL (ref 3.5–5.0)
Alkaline Phosphatase: 77 U/L (ref 38–126)
Anion gap: 9 (ref 5–15)
BUN: 20 mg/dL (ref 6–20)
CO2: 23 mmol/L (ref 22–32)
Calcium: 9.3 mg/dL (ref 8.9–10.3)
Chloride: 104 mmol/L (ref 98–111)
Creatinine, Ser: 1.17 mg/dL (ref 0.61–1.24)
GFR, Estimated: >60 mL/min (ref >60)
Glucose, Bld: 149 mg/dL — ABNORMAL HIGH (ref 70–99)
Potassium: 4.2 mmol/L (ref 3.5–5.1)
Sodium: 136 mmol/L (ref 135–145)
Total Bilirubin: 1 mg/dL (ref 0.0–1.2)
Total Protein: 6.7 g/dL (ref 6.5–8.1)

## 2023-08-10 MED ORDER — ALPRAZOLAM 0.5 MG PO TABS: 0.5000 mg | ORAL_TABLET | Freq: Two times a day (BID) | ORAL | 0 refills | Status: DC | PRN

## 2023-08-10 NOTE — Assessment & Plan Note (Signed)
 Treatment plan as listed above.

## 2023-08-10 NOTE — Progress Notes (Signed)
 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  Recent CT showed thoracic lymphadenopathy which were biopsy proven recurrent metastatic RCC 2nd line treatment with cabozantinib 60mg  daily He has developed more toxicities and not able to tolerate.  Recommend 3 weeks treatment break and once all symptoms resolves, I recommend dose reduction to 40mg  daily.    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  Recent CT showed thoracic lymphadenopathy which were biopsy proven recurrent metastatic RCC 2nd line treatment with cabozantinib 60mg  daily--> did not tolerate, after 3 weeks chemo break  switched to 40mg  daily on 07/29/2023  Labs are reviewed and discussed with patient. So far he tolerates well.  Continue cabozantinib 40mg  daily.      Encounter for antineoplastic chemotherapy Treatment plan as listed above.   Palmar plantar erythrodysesthesia Stable symptoms.  Continue emollient creams or lotions containing urea topically   Orders Placed This Encounter  Procedures   CT CHEST ABDOMEN PELVIS WO CONTRAST    Standing Status:   Future    Expected Date:   08/24/2023    Expiration Date:   08/09/2024    Preferred imaging location?:   [facility]    If indicated for the ordered procedure, I authorize the administration of oral contrast media per Radiology protocol:   Yes    Does the patient have a contrast media/X-ray dye allergy?:   No   CBC with Differential (Cancer Center Only)    Standing Status:   Future    Expected Date:   08/17/2023    Expiration Date:   08/09/2024   CMP (Cancer Center only)    Standing Status:   Future     Expected Date:   08/17/2023    Expiration Date:   08/09/2024   Follow up: I will see patient 2 weeks after starting on 40mg  dosage All questions were answered. The patient knows to call the clinic with any problems, questions or concerns.  Rickard Patience, MD, PhD [facility] Hematology Oncology 08/10/2023     HISTORY OF PRESENTING ILLNESS:   CINCH ORMOND III is a  58 y.o.  male presents for follow up for metastatic kidney cancer Oncology History  Metastatic renal cell carcinoma (HCC)  06/2018 Imaging   CT urogram done which showed right 4 cm central enhancing renal mass with invasion into the collecting system. X-ray of chest was performed to see complete staging which showed no concerning findings   06/28/2018 Cancer Diagnosis      06/28/2018 Procedure    patient underwent a cystoscopy, bladder biopsy and a right retrograde pyelogram with intraoperative interpretation, right diagnostic ureteroscopy, right renal pelvis biopsy, right ureteral stent placement.  biopsy showed atypical cell clusters, with extensive crush artifact, nondiagnostic.     07/22/2018 Initial Diagnosis   Patient underwent right radical laparoscopic nephrectomy with biopsy showing 5.3 cm RCC, clear cell type, grade 3, tumor invades renal vein and segmental branches, pelvic calyceal system and perirenal sinus/fat.  Surgical margins negative for tumor.pT3a,Nx Post-operation, patient was on surveillance after surgery.   10/28/2018 Imaging   CT abdomen pelvis with contrast showed minimal fluid and or postoperative changes within the right renal fossa.  No evidence of metastatic disease or recurrence.  Patient has left kidney lesion previously characterized as nonenhancing cyst by multi phasic contrast-enhanced CT.   04/30/2019 Progression   Patient reports an episode of gross hematuria in early December 2020, patient is due for 55-month surveillance CT scan. 04/30/2019 CT abdomen pelvis with contrast showed high attenuation  mass at the right uretero vesicle junction, with extension into the bladder.  Bilateral pulmonary nodules, most indicated for metastatic disease.  Patient underwent cystoscopy and transurethral resection of irregular bladder tumor 3 cm.  Mass appeared to emanate from the right ureteral orifice.  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  #NGS: [employer] PD L1 TPS 1%   05/28/2019 Imaging   CT chest w contrast 1. Numerous bilateral pulmonary nodules measuring up to 10 mm diameter. Imaging features compatible with metastatic disease. 2. Left hilar lymphadenopathy, also highly suspicious for metastatic involvement.    05/28/2019 Imaging   Bone scan showed  No scintigraphic evidence of osseous metastatic disease.    06/05/2019 - 06/21/2021 Chemotherapy   06/05/19 Axitinib 5mg  BID+ [name]  09/2019 off axitinib and Keytruda since the beginning of May due to transaminitis. GI work up negative.  Ultrasound liver vascular Doppler is negative #11/24/2019, resumed on Keytruda every 3 weeks #01/05/2020, axitinib was resumed 3 mg #01/20/2020, axitinib was discontinued due to recurrence of transaminitis.  [name] was temporarily held. #03/16/2020-06/21/2021 resumed on Keytruda every 3 weeks [name] was discontinued due to recurrent immunotherapy induced diarrhea    07/25/2019 Imaging   Brain w wo contrast No mass, hemorrhage, or abnormal enhancement.   09/29/2020 Imaging   CT scan showed no evidence of local recurrence or new/progressive disease in the chest abdomen or pelvis.  Tiny nonspecific lung nodules are unchanged.  No new lung nodules   01/14/2021 Imaging   CT chest abdomen pelvis showed stable exam, no new or progressive metastatic disease within chest abdomen pelvis. Stable lung nodules. Aortic atherosclerosis.    04/15/2021 Imaging   CT chest abdomen pelvis with contrast showed stable examination.  No evidence of recurrent or metastatic carcinoma  within the chest abdomen pelvis.     08/02/2021 Imaging   CT chest abdomen pelvis without contrast, stable examination without evidence of recurrence or metastatic disease.  Mild asymmetric diffuse esophageal wall thickening, similar to prior examination.  Likely chronic esophagitis.  Aortic atherosclerosis   10/21/2021 Procedure   colonoscopy showed 3 mm polyp which was resected and retrieved.  Negative for dysplasia/malignancy.  Random biopsy showed lymphocytic colitis. EGD showed long segment Barrett's esophagus.  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  Negative for dysplasia and malignancy.  Small bowel cold biopsy showed reactive duodenitis.  Negative for CMV.  Negative for dysplasia or malignancy.   11/16/2021 Imaging   CT chest abdomen pelvis without contrast showed stable examination without evidence of local recurrence or active malignant disease in the chest abdomen pelvis.  Reflux retained contrast in the esophagus suggestive of esophagitis.  Aortic atherosclerosis   07/03/2022 Imaging   CT chest abdomen pelvis w contrast   1. Stable examination status post right nephrectomy without evidence of new or progressive disease in the chest, abdomen or pelvis. 2. Stable scattered tiny pulmonary nodules favored benign etiology. 3.  Aortic Atherosclerosis    01/04/2023 Imaging   CT chest abdomen pelvis wo contrast showed  1. New clustered nodularity and ground-glass in the peripheral right lower lobe, likely infectious/inflammatory. Additionally there are new enlarged mediastinal lymph nodes which given the above findings are  favored reactive but nonspecific. Suggest attention on short-term interval follow-up dedicated chest CT to ensure resolution. 2. Stable scattered tiny pulmonary nodules. 3. No evidence of local recurrence or metastatic disease in the abdomen or pelvis. 4.  Aortic Atherosclerosis   04/09/2023 Imaging   CT chest wo contrast   1. Persistent right paratracheal and subcarinal adenopathy. These are stable to slightly increased in the interval (on the order of 1-2 mm). 2. Previously noted left upper lobe and right upper lobe lung nodules are not significantly changed in the interval. 3. New part solid nodule within the anterior left upper lobe measures 6 mm. The appearance is nonspecific. This may be postinflammatory or infectious in etiology. Consider short-term interval follow-up with repeat CT of the chest in 3 months to ensure resolution. 4. Stable appearance of focal area of ground-glass attenuation and interstitial prominence within the periphery of the right upper lobe. Favor sequelae of inflammation or infection 5. Coronary artery calcifications. 6.  Aortic Atherosclerosis    05/03/2023 Relapse/Recurrence   Biopsy via bronchoscopy  A. LYMPH NODE, 4R, FINE NEEDLE ASPIRATION:  - Carcinoma, consistent with renal cell carcinoma  - No definitive lymphoid tissue identified   B. LYMPH NODE, 7, FINE NEEDLE ASPIRATION:  - Carcinoma, consistent with renal cell carcinoma  - Lymphoid tissue identified   C. LYMPH NODE, 11R, FINE NEEDLE ASPIRATION:  - Carcinoma, consistent with renal cell carcinoma  - Lymphoid tissue identified    05/30/2023 -  Chemotherapy   Cabozantinib 60mg  daily   Clear cell carcinoma of right kidney (HCC)     #History of cutaneous psoriasis, mostly on his right hand,    INTERVAL HISTORY HOLMAN BONSIGNORE III is a 58 y.o. male who has above history reviewed by me today presents for follow up visit for management of metastatic RCC. Patient reports doing well. + Chronic cough, stable He has been on cabozantinib 40mg  daily since 07/29/2023, so far he tolerates.  No new complaints.   Review of Systems  Constitutional:  Negative for appetite change, chills, fatigue, fever and unexpected weight change.  HENT:   Negative for hearing loss and voice change.   Eyes:  Negative for eye  problems and icterus.  Respiratory:  Positive for cough. Negative for chest tightness and shortness of breath.   Cardiovascular:  Negative for chest pain and leg swelling.  Gastrointestinal:  Negative for abdominal distention, abdominal pain, blood in stool, diarrhea and nausea.  Endocrine: Negative for hot flashes.  Genitourinary:  Negative for difficulty urinating, dysuria and frequency.   Musculoskeletal:        Bilateral knee replacement.   Skin:  Negative for itching and rash.  Neurological:  Negative for extremity weakness, headaches, light-headedness and numbness.  Hematological:  Negative for adenopathy. Does not bruise/bleed easily.  Psychiatric/Behavioral:  Negative for confusion.     MEDICAL HISTORY:  Past Medical History:  Diagnosis Date   Arthritis    hands, left knee   Cancer (HCC)    COPD (chronic obstructive pulmonary disease) (HCC)    Diabetes mellitus without complication (HCC)    Eczema 01/08/2017   GERD (gastroesophageal reflux disease)    History of kidney cancer    Hyperlipidemia LDL goal <100 11/02/2014   Hypertension    Kidney stone    YEAR H/O HEMATURIA   Psoriasis    Wears dentures    full upper, partial lower.  Has, does not wear    SURGICAL HISTORY: Past Surgical History:  Procedure Laterality Date  BRONCHIAL NEEDLE ASPIRATION BIOPSY  05/03/2023   Procedure: BRONCHIAL NEEDLE ASPIRATION BIOPSIES;  Surgeon: Janann Colonel, MD;  Location: [facility] ENDOSCOPY;  Service: Pulmonary;;   COLONOSCOPY N/A 10/21/2021   Procedure: COLONOSCOPY;  Surgeon: Midge Minium, MD;  Location: [facility] SUR[facility]ERY [facility];  Service: Endoscopy;  Laterality: N/A;   COLONOSCOPY WITH PROPOFOL N/A 02/23/2017   Procedure: COLONOSCOPY WITH PROPOFOL;  Surgeon: Midge Minium, MD;  Location: [facility] SURGERY CNTR;  Service: Endoscopy;  Laterality: N/A;  Diabetic - oral meds   CYST EXCISION  12/2014   on back   CYSTOSCOPY W/ RETROGRADES Right 06/28/2018   Procedure: CYSTOSCOPY WITH  RETROGRADE PYELOGRAM;  Surgeon: Sondra Come, MD;  Location: [facility] ORS;  Service: Urology;  Laterality: Right;   CYSTOSCOPY W/ URETERAL STENT REMOVAL Right 07/22/2018   Procedure: CYSTOSCOPY WITH STENT REMOVAL;  Surgeon: Sondra Come, MD;  Location: [facility] ORS;  Service: Urology;  Laterality: Right;   CYSTOSCOPY WITH BIOPSY Right 06/28/2018   Procedure: CYSTOSCOPY WITH RENAL BIOPSY;  Surgeon: Sondra Come, MD;  Location: [facility] ORS;  Service: Urology;  Laterality: Right;   CYSTOSCOPY WITH URETEROSCOPY AND STENT PLACEMENT Right 06/28/2018   Procedure: CYSTOSCOPY WITH URETEROSCOPY AND STENT PLACEMENT;  Surgeon: Sondra Come, MD;  Location: [facility] ORS;  Service: Urology;  Laterality: Right;   ESOPHAGOGASTRODUODENOSCOPY N/A 10/21/2021   Procedure: ESOPHAGOGASTRODUODENOSCOPY (EGD);  Surgeon: Midge Minium, MD;  Location: [facility] SUR[facility]ERY [facility];  Service: Endoscopy;  Laterality: N/A;   FULGURATION OF BLADDER TUMOR N/A 06/28/2018   Procedure: CYSTOSCOPY BLADDER BIOPSY, FULGERATION OF BLADDER;  Surgeon: Sondra Come, MD;  Location: [facility] ORS;  Service: Urology;  Laterality: N/A;   JOINT REPLACEMENT Left    KNEE SURGERY Left    LAPAROSCOPIC NEPHRECTOMY, HAND ASSISTED Right 07/22/2018   Procedure: HAND ASSISTED LAPAROSCOPIC NEPHRECTOMY;  Surgeon: Sondra Come, MD;  Location: [facility] ORS;  Service: Urology;  Laterality: Right;   NASAL SINUS SURGERY  07/2013   POLYPECTOMY N/A 02/23/2017   Procedure: POLYPECTOMY;  Surgeon: Midge Minium, MD;  Location: [facility] SURGERY [facility]TR;  Service: Endoscopy;  Laterality: N/A;   POLYPECTOMY  10/21/2021   Procedure: POLYPECTOMY;  Surgeon: Midge Minium, MD;  Location: [facility]ERY [facility];  Service: Endoscopy;;   REPLACEMENT TOTAL KNEE Right    tooth abstraction     TRANSURETHRAL RESECTION OF BLADDER TUMOR N/A 05/12/2019   Procedure: TRANSURETHRAL RESECTION OF BLADDER TUMOR (TURBT);  Surgeon: Sondra Come, MD;  Location: [facility] ORS;  Service: Urology;  Laterality:  N/A;   VARICOCELE EXCISION     VIDEO BRONCHOSCOPY WITH ENDOBRONCHIAL ULTRASOUND N/A 05/03/2023   Procedure: VIDEO BRONCHOSCOPY WITH ENDOBRONCHIAL ULTRASOUND;  Surgeon: Janann Colonel, MD;  Location: [facility] ENDOSCOPY;  Service: Pulmonary;  Laterality: N/A;   XI ROBOTIC ASSISTED VENTRAL HERNIA N/A 11/18/2020   Procedure: XI ROBOTIC ASSISTED VENTRAL HERNIA, incisional;  Surgeon: Leafy Ro, MD;  Location: [facility] ORS;  Service: General;  Laterality: N/A;    SOCIAL HISTORY: Social History   Socioeconomic History   Marital status: Married    Spouse name: Isabelle Course   Number of children: Not on file   Years of education: Not on file   Highest education level: Not on file  Occupational History   Not on file  Tobacco Use   Smoking status: Former    Current packs/day: 0.00    Types: Cigarettes    Quit date: 06/15/2013    Years since quitting: 10.1   Smokeless tobacco: Never  Vaping Use   Vaping status: Never Used  Substance  and Sexual Activity   Alcohol use: No    Alcohol/week: 0.0 standard drinks of alcohol   Drug use: Yes    Types: Marijuana   Sexual activity: Yes  Other Topics Concern   Not on file  Social History Narrative   Lives with wife, sister in law   Social Drivers of Health   Financial Resource Strain: Low Risk  (09/13/2021)   [facility], [facility]   Overall Financial Resource Strain (CARDIA)    Difficulty of Paying Living Expenses: Not hard at all  Food Insecurity: No Food Insecurity (09/13/2021)   [facility]   Hunger Vital Sign    Worried About Running Out of Food in the Last Year: Never true    Ran Out of Food in the Last Year: Never true  Transportation Needs: No Transportation Needs (09/13/2021)   Received [facility], [facility]   [facility] - Transportation    Lack of Transportation (Medical): No    Lack of Transportation (Non-Medical): No  Physical Activity: Not on file  Stress: Not on  file  Social Connections: Not on file  Intimate Partner Violence: Not on file    FAMILY HISTORY: Family History  Problem Relation Age of Onset   Heart disease Father    Hypertension Father    COPD Father    Colon cancer Maternal Grandmother    Heart attack Paternal Grandfather     ALLERGIES:  is allergic to axitinib, propofol, and glucotrol [glipizide].  MEDICATIONS:  Current Outpatient Medications  Medication Sig Dispense Refill   albuterol (VENTOLIN HFA) 108 (90 Base) MCG/ACT inhaler Inhale 2 puffs into the lungs every 6 (six) hours as needed for wheezing or shortness of breath. 8 g 2   aspirin 81 MG EC tablet Take 81 mg by mouth daily with supper.     CABOMETYX 40 MG tablet TAKE 1 TABLET BY MOUTH 1 TIME A DAY. TAKE ON AN EMPTY STOMACH, 1 HOUR BEFORE OR 2 HOURS AFTER MEALS. 30 tablet 0   clobetasol ointment (TEMOVATE) 0.05 % Apply 1 Application topically 2 (two) times daily as needed. 30 g 0   empagliflozin (JARDIANCE) 25 MG TABS tablet Take 1 tablet (25 mg total) by mouth daily before breakfast. 90 tablet 1   fluticasone (FLONASE) 50 MCG/ACT nasal spray Place 2 sprays into both nostrils daily. 16 g 1   lisinopril (ZESTRIL) 20 MG tablet Take 1 tablet (20 mg total) by mouth daily. 90 tablet 3   metFORMIN (GLUCOPHAGE-XR) 500 MG 24 hr tablet Take 2 tablets (1,000 mg total) by mouth 2 (two) times daily with a meal. 180 tablet 3   Multiple Vitamin (MULTIVITAMIN) tablet Take 1 tablet by mouth daily with supper.     omeprazole (PRILOSEC) 20 MG capsule TAKE 1 CAPSULE DAILY AS    NEEDED FOR ACID REFLUX 90 capsule 0   ondansetron (ZOFRAN) 8 MG tablet Take 1 tablet (8 mg total) by mouth every 8 (eight) hours as needed for nausea or vomiting. 20 tablet 1   prochlorperazine (COMPAZINE) 10 MG tablet Take 1 tablet (10 mg total) by mouth every 6 (six) hours as needed. 30 tablet 1   rosuvastatin (CRESTOR) 20 MG tablet Take 1 tablet (20 mg total) by mouth daily with supper. 90 tablet 1    Semaglutide, 1 MG/DOSE, 4 MG/3ML SOPN Inject 1 mg as directed once a week. 3 mL 2   umeclidinium-vilanterol (ANORO ELLIPTA) 62.5-25 MCG/ACT  AEPB Inhale 1 puff into the lungs daily. 3 each 3   ALPRAZolam (XANAX) 0.5 MG tablet Take 1 tablet (0.5 mg total) by mouth 2 (two) times daily as needed for anxiety or sleep. 60 tablet 0   magic mouthwash (multi-ingredient) oral suspension Take 5 ml by mouth 4 (four) times daily as needed for mouth pain. (Patient not taking: Reported on 08/10/2023) 480 mL 2   No current facility-administered medications for this visit.     PHYSICAL EXAMINATION: ECOG PERFORMANCE STATUS: 1 - Symptomatic but completely ambulatory Vitals:   08/10/23 0800  BP: 118/81  Pulse: 83  Resp: 18  Temp: (!) 95.1 F (35.1 C)   Filed Weights   08/10/23 0800  Weight: 201 lb 1.6 oz (91.2 kg)     Physical Exam Constitutional:      General: He is not in acute distress. Eyes:     General: No scleral icterus. Cardiovascular:     Rate and Rhythm: Normal rate and regular rhythm.  Pulmonary:     Effort: Pulmonary effort is normal. No respiratory distress.     Breath sounds: No wheezing.  Abdominal:     General: Bowel sounds are normal.     Palpations: Abdomen is soft.  Musculoskeletal:        General: No deformity. Normal range of motion.     Cervical back: Normal range of motion and neck supple.     Comments: Bilateral knee replacement.   Skin:    General: Skin is warm and dry.     Findings: No erythema or rash.  Neurological:     Mental Status: He is alert and oriented to person, place, and time. Mental status is at baseline.  Psychiatric:        Mood and Affect: Mood normal.     LABORATORY DATA:  I have reviewed the data as listed    Latest Ref Rng & Units 08/10/2023    8:16 AM 07/13/2023   10:54 AM 06/29/2023   10:40 AM  CBC  WBC 4.0 - 10.5 K/uL 6.7  6.3  6.7   Hemoglobin 13.0 - 17.0 g/dL 04.5  40.9  81.1   Hematocrit 39.0 - 52.0 % 46.2  45.1  49.1    Platelets 150 - 400 K/uL 218  136  170       Latest Ref Rng & Units 08/10/2023    8:16 AM 07/13/2023   10:54 AM 06/29/2023   10:40 AM  CMP  Glucose 70 - 99 mg/dL 914  782  956   BUN 6 - 20 mg/dL 20  19  19    Creatinine 0.61 - 1.24 mg/dL 2.13  0.86  5.78   Sodium 135 - 145 mmol/L 136  135  136   Potassium 3.5 - 5.1 mmol/L 4.2  4.1  4.3   Chloride 98 - 111 mmol/L 104  101  101   CO2 22 - 32 mmol/L 23  26  25    Calcium 8.9 - 10.3 mg/dL 9.3  8.7  9.4   Total Protein 6.5 - 8.1 g/dL 6.7  6.4  6.8   Total Bilirubin 0.0 - 1.2 mg/dL 1.0  1.0  0.8   Alkaline Phos 38 - 126 U/L 77  72  90   AST 15 - 41 U/L 30  29  39   ALT 0 - 44 U/L 30  34  41     RADIOGRAPHIC STUDIES: I have personally reviewed the radiological images as listed and agreed with  the findings in the report.  MR Brain W Wo Contrast Result Date: 06/04/2023 CLINICAL DATA:  Renal cell carcinoma. EXAM: MRI HEAD WITHOUT AND WITH CONTRAST TECHNIQUE: Multiplanar, multiecho pulse sequences of the brain and surrounding structures were obtained without and with intravenous contrast. CONTRAST:  9mL GADAVIST GADOBUTROL 1 MMOL/ML IV SOLN COMPARISON:  Head MRI 07/25/2019 FINDINGS: Brain: There is no evidence of an acute infarct, intracranial hemorrhage, mass, midline shift, or extra-axial fluid collection. Cerebral volume is within normal limits for age. The ventricles are normal in size. No significant white matter disease is evident. No abnormal enhancement is identified. A partially empty sella is unchanged. Vascular: Major intracranial vascular flow voids are preserved. Skull and upper cervical spine: Unremarkable bone marrow signal. Sinuses/Orbits: Unremarkable orbits. Paranasal sinuses and mastoid air cells are clear. Other: None. IMPRESSION: No evidence of intracranial metastases. Electronically Signed   By: Sebastian Ache M.D.   On: 06/04/2023 17:37   CT CHEST ABDOMEN PELVIS WO CONTRAST Result Date: 05/24/2023 CLINICAL DATA:  History of renal  cell carcinoma metastatic. Follow-up. * Tracking Code: BO * EXAM: CT CHEST, ABDOMEN AND PELVIS WITHOUT CONTRAST TECHNIQUE: Multidetector CT imaging of the chest, abdomen and pelvis was performed following the standard protocol without IV contrast. RADIATION DOSE REDUCTION: This exam was performed according to the departmental dose-optimization program which includes automated exposure control, adjustment of the mA and/or kV according to patient size and/or use of iterative reconstruction technique. COMPARISON:  Multiple priors including most recent CT April 09, 2023 FINDINGS: CT CHEST FINDINGS Cardiovascular: Scattered aortic atherosclerosis. Normal caliber central pulmonary arteries. Normal size heart. Coronary artery calcifications. No significant pericardial effusion/thickening. Mediastinum/Nodes: No suspicious thyroid nodule. Enlarged mediastinal lymph nodes are similar prior.  For reference: -right paratracheal lymph node measures 14 mm in short axis on image 30/2 previously 16 mm. -subcarinal lymph node measures 16 mm in short axis on image 36/2 previously 17 mm. No pathologically enlarged hilar or axillary lymph nodes noting limited evaluation of the hilar structures on noncontrast enhanced examination. Patulous esophagus. Lungs/Pleura: Slightly increased clustered nodularity interposed within interstitial prominence and ground-glass in the periphery of the right lower lobe on image 92/4. Stable scattered pulmonary nodules.  For reference: -pulmonary nodule in the right lung apex measures 3 mm on image 46/4 previously 4 mm. -left upper lobe pulmonary nodule measures 3 mm on image 63/4, previously 4 mm. -part solid nodule in the anterior left upper lobe measures 5 mm on image 64/4 previously 6 mm. No new suspicious pulmonary nodules or masses. Musculoskeletal: No aggressive lytic or blastic lesion of bone. Intramuscular lipoma in the left paraspinal musculature image 39/2. CT ABDOMEN PELVIS FINDINGS  Hepatobiliary: Unremarkable noncontrast enhanced appearance of the hepatic parenchyma. Gallbladder is unremarkable. No biliary ductal dilation. Pancreas: No pancreatic ductal dilation or evidence of acute inflammation. Spleen: No splenomegaly. Adrenals/Urinary Tract: Bilateral adrenal glands appear normal. Right kidney surgically absent without new suspicious nodularity in the nephrectomy bed. Stable soft tissue density along the posterior aspect of the surgical bed measuring 2.5 x 0.9 cm on image 72/2 stable dating back to Sep 29, 2020. Stable hyperdense 15 mm right renal lesion previously characterized as a hemorrhagic/proteinaceous renal cyst, although the lesion does not meet these criteria on today's examination measuring Hounsfield units of 58. Suggest attention on follow-up imaging. Urinary bladder is unremarkable for degree of distension. Stomach/Bowel: No radiopaque enteric contrast material was administered. Stomach is unremarkable for degree of distension. No pathologic dilation of small or large bowel. Normal appendix. No  evidence of acute bowel inflammation. Vascular/Lymphatic: Aortic atherosclerosis. Smooth IVC contours. No pathologically enlarged abdominal or pelvic lymph nodes. Reproductive: Prostate is unremarkable. Other: No significant abdominopelvic free fluid. Musculoskeletal: No aggressive lytic or blastic lesion of bone. IMPRESSION: 1. Slightly increased clustered nodularity interposed within interstitial prominence and ground-glass in the periphery of the right lower lobe. Suggest continued attention on short-term follow-up imaging to ensure resolution/stability. 2. Stable scattered additional tiny pulmonary nodules. 3. Stable enlarged mediastinal lymph nodes. 4. No evidence of recurrent or metastatic disease in the abdomen or pelvis. 5. Stable hyperdense 15 mm right renal lesion previously characterized as a hemorrhagic/proteinaceous renal cyst, although the lesion does not meet these  criteria on today's examination measuring Hounsfield units of 58. Suggest attention on follow-up imaging. 6.  Aortic Atherosclerosis (ICD10-I70.0). Electronically Signed   By: Maudry Mayhew M.D.   On: 05/24/2023 15:58

## 2023-08-10 NOTE — Assessment & Plan Note (Signed)
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  Recent CT showed thoracic lymphadenopathy which were biopsy proven recurrent metastatic RCC 2nd line treatment with cabozantinib 60mg  daily He has developed more toxicities and not able to tolerate.  Recommend 3 weeks treatment break and once all symptoms resolves, I recommend dose reduction to 40mg  daily.    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  Recent CT showed thoracic lymphadenopathy which were biopsy proven recurrent metastatic RCC 2nd line treatment with cabozantinib 60mg  daily--> did not tolerate, after 3 weeks chemo break  switched to 40mg  daily on 07/29/2023  Labs are reviewed and discussed with patient. So far he tolerates well.  Continue cabozantinib 40mg  daily.

## 2023-08-10 NOTE — Progress Notes (Signed)
 Patient denies new or acute problems/concerns today.

## 2023-08-10 NOTE — Assessment & Plan Note (Signed)
 Stable symptoms.  Continue emollient creams or lotions containing urea topically

## 2023-08-13 LAB — HM DIABETES EYE EXAM

## 2023-08-24 ENCOUNTER — Ambulatory Visit
Admission: RE | Admit: 2023-08-24 | Discharge: 2023-08-24 | Disposition: A | Discharge status: Home / Self Care | Source: Ambulatory Visit | Attending: Oncology | Admitting: Oncology

## 2023-08-24 DIAGNOSIS — C641 Malignant neoplasm of right kidney, except renal pelvis: Secondary | ICD-10-CM | POA: Insufficient documentation

## 2023-08-30 ENCOUNTER — Other Ambulatory Visit: Payer: Self-pay | Admitting: Oncology

## 2023-08-30 DIAGNOSIS — C641 Malignant neoplasm of right kidney, except renal pelvis: Secondary | ICD-10-CM

## 2023-09-03 ENCOUNTER — Encounter: Payer: Self-pay | Admitting: Internal Medicine

## 2023-09-04 MED ORDER — GLUCOSE BLOOD VI STRP: ORAL_STRIP | 12 refills | Status: DC

## 2023-09-05 ENCOUNTER — Telehealth: Payer: Self-pay

## 2023-09-05 MED ORDER — GLUCOSE BLOOD VI STRP: ORAL_STRIP | 12 refills | Status: DC

## 2023-09-05 NOTE — Telephone Encounter (Signed)
 refills

## 2023-09-10 ENCOUNTER — Inpatient Hospital Stay: Admitting: Oncology

## 2023-09-10 ENCOUNTER — Encounter: Payer: Self-pay | Admitting: Oncology

## 2023-09-10 ENCOUNTER — Inpatient Hospital Stay: Attending: Oncology

## 2023-09-10 VITALS — BP 137/95 | HR 83 | Temp 96.0°F | Resp 18

## 2023-09-10 DIAGNOSIS — K521 Toxic gastroenteritis and colitis: Secondary | ICD-10-CM

## 2023-09-10 DIAGNOSIS — C771 Secondary and unspecified malignant neoplasm of intrathoracic lymph nodes: Secondary | ICD-10-CM | POA: Diagnosis present

## 2023-09-10 DIAGNOSIS — Z87891 Personal history of nicotine dependence: Secondary | ICD-10-CM | POA: Insufficient documentation

## 2023-09-10 DIAGNOSIS — Z7982 Long term (current) use of aspirin: Secondary | ICD-10-CM | POA: Diagnosis not present

## 2023-09-10 DIAGNOSIS — L271 Localized skin eruption due to drugs and medicaments taken internally: Secondary | ICD-10-CM

## 2023-09-10 DIAGNOSIS — Z79899 Other long term (current) drug therapy: Secondary | ICD-10-CM | POA: Diagnosis not present

## 2023-09-10 DIAGNOSIS — C641 Malignant neoplasm of right kidney, except renal pelvis: Secondary | ICD-10-CM

## 2023-09-10 DIAGNOSIS — C7911 Secondary malignant neoplasm of bladder: Secondary | ICD-10-CM | POA: Diagnosis not present

## 2023-09-10 DIAGNOSIS — T451X5A Adverse effect of antineoplastic and immunosuppressive drugs, initial encounter: Secondary | ICD-10-CM

## 2023-09-10 DIAGNOSIS — Z5111 Encounter for antineoplastic chemotherapy: Secondary | ICD-10-CM

## 2023-09-10 LAB — CMP (CANCER CENTER ONLY)
ALT: 33 U/L (ref 0–44)
AST: 37 U/L (ref 15–41)
Albumin: 3.7 g/dL (ref 3.5–5.0)
Alkaline Phosphatase: 71 U/L (ref 38–126)
Anion gap: 8 (ref 5–15)
BUN: 15 mg/dL (ref 6–20)
CO2: 22 mmol/L (ref 22–32)
Calcium: 8.9 mg/dL (ref 8.9–10.3)
Chloride: 105 mmol/L (ref 98–111)
Creatinine: 0.89 mg/dL (ref 0.61–1.24)
GFR, Estimated: >60 mL/min (ref >60)
Glucose, Bld: 92 mg/dL (ref 70–99)
Potassium: 3.9 mmol/L (ref 3.5–5.1)
Sodium: 135 mmol/L (ref 135–145)
Total Bilirubin: 0.6 mg/dL (ref 0.0–1.2)
Total Protein: 6.3 g/dL — ABNORMAL LOW (ref 6.5–8.1)

## 2023-09-10 LAB — CBC WITH DIFFERENTIAL (CANCER CENTER ONLY)
Abs Immature Granulocytes: 0.02 10*3/uL (ref 0.00–0.07)
Basophils Absolute: 0 10*3/uL (ref 0.0–0.1)
Basophils Relative: 0 %
Eosinophils Absolute: 0.5 10*3/uL (ref 0.0–0.5)
Eosinophils Relative: 7 %
HCT: 46.1 % (ref 39.0–52.0)
Hemoglobin: 15.7 g/dL (ref 13.0–17.0)
Immature Granulocytes: 0 %
Lymphocytes Relative: 41 %
Lymphs Abs: 2.9 10*3/uL (ref 0.7–4.0)
MCH: 31.2 pg (ref 26.0–34.0)
MCHC: 34.1 g/dL (ref 30.0–36.0)
MCV: 91.5 fL (ref 80.0–100.0)
Monocytes Absolute: 0.4 10*3/uL (ref 0.1–1.0)
Monocytes Relative: 5 %
Neutro Abs: 3.2 10*3/uL (ref 1.7–7.7)
Neutrophils Relative %: 47 %
Platelet Count: 207 10*3/uL (ref 150–400)
RBC: 5.04 MIL/uL (ref 4.22–5.81)
RDW: 15.7 % — ABNORMAL HIGH (ref 11.5–15.5)
WBC Count: 7 10*3/uL (ref 4.0–10.5)
nRBC: 0 % (ref 0.0–0.2)

## 2023-09-10 NOTE — Assessment & Plan Note (Signed)
 Mild symptoms.  Continue emollient creams or lotions containing urea topically

## 2023-09-10 NOTE — Assessment & Plan Note (Signed)
 Treatment plan as listed above.

## 2023-09-10 NOTE — Assessment & Plan Note (Addendum)
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  Recent CT showed thoracic lymphadenopathy which were biopsy proven recurrent metastatic RCC 2nd line treatment with cabozantinib  60mg  daily--> did not tolerate, after 3 weeks chemo break  switched to 40mg  daily on 07/29/2023  Labs are reviewed and discussed with patient. So far he tolerates well.  Continue cabozantinib  40mg  daily.

## 2023-09-10 NOTE — Assessment & Plan Note (Signed)
 Stable symptoms  Recommend imodium  PRN as directed.

## 2023-09-10 NOTE — Progress Notes (Signed)
 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  Recent CT showed thoracic lymphadenopathy which were biopsy proven recurrent metastatic RCC 2nd line treatment with cabozantinib  60mg  daily--> did not tolerate, after 3 weeks chemo break  switched to 40mg  daily on 07/29/2023  Labs are reviewed and discussed with patient. So far he tolerates well.  Continue cabozantinib  40mg  daily.      Chemotherapy induced diarrhea Stable symptoms  Recommend imodium  PRN as directed.   Encounter for antineoplastic chemotherapy Treatment plan as listed above.   Palmar plantar erythrodysesthesia Mild symptoms.  Continue emollient creams or lotions containing urea topically   Orders Placed This Encounter  Procedures   CBC with Differential (Cancer Center Only)    Standing Status:   Future    Expected Date:   10/10/2023    Expiration Date:   09/09/2024   CMP (Cancer Center only)    Standing Status:   Future    Expected Date:   10/10/2023    Expiration Date:   09/09/2024   Follow up: 4 weeks.  All questions were answered. The patient knows to call the clinic with any problems, questions or concerns.  Matthew Forbes, MD, PhD [facility] Hematology Oncology 09/10/2023     HISTORY OF PRESENTING ILLNESS:   Matthew Woodard is a  58 y.o.  male presents for follow up for metastatic kidney cancer Oncology History  Metastatic renal cell carcinoma (HCC)  06/2018 Imaging   CT urogram done which showed right 4 cm central enhancing renal mass with invasion into the collecting system. X-ray of chest was performed to see complete staging which showed no concerning findings   06/28/2018 Cancer Diagnosis      06/28/2018 Procedure     patient underwent a cystoscopy, bladder biopsy and a right retrograde pyelogram with intraoperative interpretation, right diagnostic ureteroscopy, right renal pelvis biopsy, right ureteral stent placement.  biopsy showed atypical cell clusters, with extensive crush artifact, nondiagnostic.     07/22/2018 Initial Diagnosis   Patient underwent right radical laparoscopic nephrectomy with biopsy showing 5.3 cm RCC, clear cell type, grade 3, tumor invades renal vein and segmental branches, pelvic calyceal system and perirenal sinus/fat.  Surgical margins negative for tumor.pT3a,Nx Post-operation, patient was on surveillance after surgery.   10/28/2018 Imaging   CT abdomen pelvis with contrast showed minimal fluid and or postoperative changes within the right renal fossa.  No evidence of metastatic disease or recurrence.  Patient has left kidney lesion previously characterized as nonenhancing cyst by multi phasic contrast-enhanced CT.   04/30/2019 Progression   Patient reports an episode of gross hematuria in early December 2020, patient is due for 46-month surveillance CT scan. 04/30/2019 CT abdomen pelvis with contrast showed high attenuation mass at the right uretero vesicle junction, with extension into the bladder.  Bilateral pulmonary nodules, most indicated for metastatic disease.  Patient underwent cystoscopy and transurethral resection of irregular bladder tumor 3 cm.  Mass appeared to emanate from the right ureteral orifice.  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  #NGS: [employer] PD L1 TPS 1%   05/28/2019 Imaging   CT chest w contrast 1. Numerous bilateral pulmonary nodules measuring up to 10 mm diameter. Imaging features compatible with metastatic disease. 2. Left hilar lymphadenopathy,  also highly suspicious for metastatic involvement.    05/28/2019 Imaging   Bone scan showed  No scintigraphic evidence of osseous metastatic disease.     06/05/2019 - 06/21/2021 Chemotherapy   06/05/19 Axitinib  5mg  BID+ Keytruda   09/2019 off axitinib  and Keytruda  since the beginning of May due to transaminitis. GI work up negative.  Ultrasound liver vascular Doppler is negative #11/24/2019, resumed on Keytruda  every 3 weeks #01/05/2020, axitinib  was resumed 3 mg #01/20/2020, axitinib  was discontinued due to recurrence of transaminitis.  Keytruda  was temporarily held. #03/16/2020-06/21/2021 resumed on Keytruda  every 3 weeks Keytruda  was discontinued due to recurrent immunotherapy induced diarrhea    07/25/2019 Imaging   Brain w wo contrast No mass, hemorrhage, or abnormal enhancement.   09/29/2020 Imaging   CT scan showed no evidence of local recurrence or new/progressive disease in the chest abdomen or pelvis.  Tiny nonspecific lung nodules are unchanged.  No new lung nodules   01/14/2021 Imaging   CT chest abdomen pelvis showed stable exam, no new or progressive metastatic disease within chest abdomen pelvis. Stable lung nodules. Aortic atherosclerosis.    04/15/2021 Imaging   CT chest abdomen pelvis with contrast showed stable examination.  No evidence of recurrent or metastatic carcinoma within the chest abdomen pelvis.     08/02/2021 Imaging   CT chest abdomen pelvis without contrast, stable examination without evidence of recurrence or metastatic disease.  Mild asymmetric diffuse esophageal wall thickening, similar to prior examination.  Likely chronic esophagitis.  Aortic atherosclerosis   10/21/2021 Procedure   colonoscopy showed 3 mm polyp which was resected and retrieved.  Negative for dysplasia/malignancy.  Random biopsy showed lymphocytic colitis. EGD showed long segment Barrett's esophagus.  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  Negative for dysplasia and malignancy.  Small bowel cold biopsy showed reactive duodenitis.  Negative for CMV.  Negative for dysplasia or malignancy.   11/16/2021 Imaging    CT chest abdomen pelvis without contrast showed stable examination without evidence of local recurrence or active malignant disease in the chest abdomen pelvis.  Reflux retained contrast in the esophagus suggestive of esophagitis.  Aortic atherosclerosis   07/03/2022 Imaging   CT chest abdomen pelvis w contrast   1. Stable examination status post right nephrectomy without evidence of new or progressive disease in the chest, abdomen or pelvis. 2. Stable scattered tiny pulmonary nodules favored benign etiology. 3.  Aortic Atherosclerosis    01/04/2023 Imaging   CT chest abdomen pelvis wo contrast showed  1. New clustered nodularity and ground-glass in the peripheral right lower lobe, likely infectious/inflammatory. Additionally there are new enlarged mediastinal lymph nodes which given the above findings are favored reactive but nonspecific. Suggest attention on short-term interval follow-up dedicated chest CT to ensure resolution. 2. Stable scattered tiny pulmonary nodules. 3. No evidence of local recurrence or metastatic disease in the abdomen or pelvis. 4.  Aortic Atherosclerosis   04/09/2023 Imaging   CT chest wo contrast  1. Persistent right paratracheal and subcarinal adenopathy. These are stable to slightly increased in the interval (on the order of 1-2 mm). 2. Previously noted left upper lobe and right upper lobe lung nodules are not significantly changed in the interval. 3. New part solid nodule within the anterior left upper lobe measures 6 mm. The appearance is nonspecific. This may be postinflammatory or infectious in etiology. Consider short-term interval follow-up with repeat CT of the chest in 3 months to ensure resolution. 4. Stable appearance of focal area of ground-glass attenuation  and interstitial prominence within the periphery of the right upper lobe. Favor sequelae of inflammation or infection 5. Coronary artery calcifications. 6.  Aortic Atherosclerosis     05/03/2023 Relapse/Recurrence   Biopsy via bronchoscopy  A. LYMPH NODE, 4R, FINE NEEDLE ASPIRATION:  - Carcinoma, consistent with renal cell carcinoma  - No definitive lymphoid tissue identified   B. LYMPH NODE, 7, FINE NEEDLE ASPIRATION:  - Carcinoma, consistent with renal cell carcinoma  - Lymphoid tissue identified   C. LYMPH NODE, 11R, FINE NEEDLE ASPIRATION:  - Carcinoma, consistent with renal cell carcinoma  - Lymphoid tissue identified    05/30/2023 - 07/09/2023 Chemotherapy   Cabozantinib  60mg  daily, stopped due to mucositis, palmar plantar erythrodysesthesia   07/29/2023 -  Chemotherapy   Cabozantinib  40mg  daily   Clear cell carcinoma of right kidney (HCC)     #History of cutaneous psoriasis, mostly on his right hand,    INTERVAL HISTORY Matthew Woodard is a 58 y.o. male who has above history reviewed by me today presents for follow up visit for management of metastatic RCC. Patient reports doing well. + Chronic cough, stable He has been on cabozantinib  40mg  daily since 07/29/2023, so far he tolerates.  Intermittent diarrhea for 1 week, he takes 3-4 doses of imodium  and symptoms are controlled well.  No new complaints.   Review of Systems  Constitutional:  Negative for appetite change, chills, fatigue, fever and unexpected weight change.  HENT:   Negative for hearing loss and voice change.   Eyes:  Negative for eye problems and icterus.  Respiratory:  Positive for cough. Negative for chest tightness and shortness of breath.   Cardiovascular:  Negative for chest pain and leg swelling.  Gastrointestinal:  Negative for abdominal distention, abdominal pain, blood in stool, diarrhea and nausea.  Endocrine: Negative for hot flashes.  Genitourinary:  Negative for difficulty urinating, dysuria and frequency.   Musculoskeletal:        Bilateral knee replacement.   Skin:  Negative for itching and rash.  Neurological:  Negative for extremity weakness, headaches,  light-headedness and numbness.  Hematological:  Negative for adenopathy. Does not bruise/bleed easily.  Psychiatric/Behavioral:  Negative for confusion.     MEDICAL HISTORY:  Past Medical History:  Diagnosis Date   Arthritis    hands, left knee   Cancer (HCC)    COPD (chronic obstructive pulmonary disease) (HCC)    Diabetes mellitus without complication (HCC)    Eczema 01/08/2017   GERD (gastroesophageal reflux disease)    History of kidney cancer    Hyperlipidemia LDL goal <100 11/02/2014   Hypertension    Kidney stone    YEAR H/O HEMATURIA   Psoriasis    Wears dentures    full upper, partial lower.  Has, does not wear    SURGICAL HISTORY: Past Surgical History:  Procedure Laterality Date   BRONCHIAL NEEDLE ASPIRATION BIOPSY  05/03/2023   Procedure: BRONCHIAL NEEDLE ASPIRATION BIOPSIES;  Surgeon: Annitta Kindler, MD;  Location: [facility];  Service: Pulmonary;;   COLONOSCOPY N/A 10/21/2021   Procedure: COLONOSCOPY;  Surgeon: Marnee Sink, MD;  Location: [facility];  Service: Endoscopy;  Laterality: N/A;   COLONOSCOPY WITH PROPOFOL  N/A 02/23/2017   Procedure: COLONOSCOPY WITH PROPOFOL ;  Surgeon: Marnee Sink, MD;  Location: [facility];  Service: Endoscopy;  Laterality: N/A;  Diabetic - oral meds   CYST EXCISION  12/2014   on back   CYSTOSCOPY W/ RETROGRADES Right 06/28/2018   Procedure: CYSTOSCOPY WITH RETROGRADE PYELOGRAM;  Surgeon: Lawerence Pressman, MD;  Location: [facility] ORS;  Service: Urology;  Laterality: Right;   CYSTOSCOPY W/ URETERAL STENT REMOVAL Right 07/22/2018   Procedure: CYSTOSCOPY WITH STENT REMOVAL;  Surgeon: Lawerence Pressman, MD;  Location: [facility] OR[facility];  Service: Urology;  Laterality: Right;   CYSTOSCOPY WITH BIOPSY Right 06/28/2018   Procedure: CYSTOSCOPY WITH RENAL BIOPSY;  Surgeon: Lawerence Pressman, MD;  Location: [facility] OR[facility];  Service: Urology;  Laterality: Right;   CYSTOSCOPY WITH URETEROSCOPY AND STENT PLACEMENT Right 06/28/2018    Procedure: CYSTOSCOPY WITH URETEROSCOPY AND STENT PLACEMENT;  Surgeon: Lawerence Pressman, MD;  Location: [facility] OR[facility];  Service: Urology;  Laterality: Right;   ESOPHAGOGASTRODUODENOSCOPY N/A 10/21/2021   Procedure: ESOPHAGOGASTRODUODENOSCOPY (EGD);  Surgeon: Marnee Sink, MD;  Location: [facility];  Service: Endoscopy;  Laterality: N/A;   FULGURATION OF BLADDER TUMOR N/A 06/28/2018   Procedure: CYSTOSCOPY BLADDER BIOPSY, FULGERATION OF BLADDER;  Surgeon: Lawerence Pressman, MD;  Location: [facility];  Service: Urology;  Laterality: N/A;   JOINT REPLACEMENT Left    KNEE SURGERY Left    LAPAROSCOPIC NEPHRECTOMY, HAND ASSISTED Right 07/22/2018   Procedure: HAND ASSISTED LAPAROSCOPIC NEPHRECTOMY;  Surgeon: Lawerence Pressman, MD;  Location: [facility];  Service: Urology;  Laterality: Right;   NASAL SINUS SURGERY  07/2013   POLYPECTOMY N/A 02/23/2017   Procedure: POLYPECTOMY;  Surgeon: Marnee Sink, MD;  Location: [facility];  Service: Endoscopy;  Laterality: N/A;   POLYPECTOMY  10/21/2021   Procedure: POLYPECTOMY;  Surgeon: Marnee Sink, MD;  Location: [facility];  Service: Endoscopy;;   REPLACEMENT TOTAL KNEE Right    tooth abstraction     TRANSURETHRAL RESECTION OF BLADDER TUMOR N/A 05/12/2019   Procedure: TRANSURETHRAL RESECTION OF BLADDER TUMOR (TURBT);  Surgeon: Lawerence Pressman, MD;  Location: [facility] OR[facility];  Service: Urology;  Laterality: N/A;   VARICOCELE EXCISION     VIDEO BRONCHOSCOPY WITH ENDOBRONCHIAL ULTRASOUND N/A 05/03/2023   Procedure: VIDEO BRONCHOSCOPY WITH ENDOBRONCHIAL ULTRASOUND;  Surgeon: Annitta Kindler, MD;  Location: [facility];  Service: Pulmonary;  Laterality: N/A;   XI ROBOTIC ASSISTED VENTRAL HERNIA N/A 11/18/2020   Procedure: XI ROBOTIC ASSISTED VENTRAL HERNIA, incisional;  Surgeon: Alben Alma, MD;  Location: [facility] OR[facility];  Service: General;  Laterality: N/A;    SOCIAL HISTORY: Social History   Socioeconomic History   Marital status: Married     Spouse name: Lonzell Robin   Number of children: Not on file   Years of education: Not on file   Highest education level: Not on file  Occupational History   Not on file  Tobacco Use   Smoking status: Former    Current packs/day: 0.00    Types: Cigarettes    Quit date: 06/15/2013    Years since quitting: 10.2   Smokeless tobacco: Never  Vaping Use   Vaping status: Never Used  Substance and Sexual Activity   Alcohol use: No    Alcohol/week: 0.0 standard drinks of alcohol   Drug use: Yes    Types: Marijuana   Sexual activity: Yes  Other Topics Concern   Not on file  Social History Narrative   Lives with wife, sister in law   Social Drivers of Health   Financial Resource Strain: Low Risk  (09/13/2021)   Received from [facility], [facility]   Overall Financial Resource Strain (CARDIA)    Difficulty of Paying Living Expenses: Not hard at all  Food Insecurity: No Food Insecurity (09/13/2021)   Received from [facility], [facility]   Hunger Vital Sign    Worried About Running Out of Food in the Last Year: Never true    Ran Out of Food in the Last Year: Never true  Transportation Needs: No Transportation Needs (09/13/2021)   Received from [facility], [facility]   [facility] - Transportation    Lack of Transportation (Medical): No    Lack of Transportation (Non-Medical): No  Physical Activity: Not on file  Stress: Not on file  Social Connections: Not on file  Intimate Partner Violence: Not on file    FAMILY HISTORY: Family History  Problem Relation Age of Onset   Heart disease Father    Hypertension Father    COPD Father    Colon cancer Maternal Grandmother    Heart attack Paternal Grandfather     ALLERGIES:  is allergic to axitinib , propofol , and glucotrol [glipizide].  MEDICATIONS:  Current Outpatient Medications  Medication Sig Dispense Refill   albuterol  (VENTOLIN  HFA) 108 (90 Base) MCG/ACT inhaler Inhale 2 puffs into the lungs every 6 (six) hours  as needed for wheezing or shortness of breath. 8 g 2   ALPRAZolam  (XANAX ) 0.5 MG tablet Take 1 tablet (0.5 mg total) by mouth 2 (two) times daily as needed for anxiety or sleep. 60 tablet 0   aspirin 81 MG EC tablet Take 81 mg by mouth daily with supper.     CABOMETYX  40 MG tablet TAKE 1 TABLET BY MOUTH 1 TIME A DAY. TAKE ON AN EMPTY STOMACH, 1 HOUR BEFORE OR 2 HOURS AFTER MEALS. 30 tablet 0   clobetasol  ointment (TEMOVATE ) 0.05 % Apply 1 Application topically 2 (two) times daily as needed. 30 g 0   empagliflozin  (JARDIANCE ) 25 MG TABS tablet Take 1 tablet (25 mg total) by mouth daily before breakfast. 90 tablet 1   fluticasone  (FLONASE ) 50 MCG/ACT nasal spray Place 2 sprays into both nostrils daily. 16 g 1   glucose blood test strip Use as instructed 100 each 12   lisinopril  (ZESTRIL ) 20 MG tablet Take 1 tablet (20 mg total) by mouth daily. 90 tablet 3   metFORMIN  (GLUCOPHAGE -XR) 500 MG 24 hr tablet Take 2 tablets (1,000 mg total) by mouth 2 (two) times daily with a meal. 180 tablet 3   Multiple Vitamin (MULTIVITAMIN) tablet Take 1 tablet by mouth daily with supper.     omeprazole  (PRILOSEC) 20 MG capsule TAKE 1 CAPSULE DAILY AS    NEEDED FOR ACID REFLUX 90 capsule 0   ondansetron  (ZOFRAN ) 8 MG tablet Take 1 tablet (8 mg total) by mouth every 8 (eight) hours as needed for nausea or vomiting. 20 tablet 1   prochlorperazine  (COMPAZINE ) 10 MG tablet Take 1 tablet (10 mg total) by mouth every 6 (six) hours as needed. 30 tablet 1   rosuvastatin  (CRESTOR ) 20 MG tablet Take 1 tablet (20 mg total) by mouth daily with supper. 90 tablet 1   Semaglutide , 1 MG/DOSE, 4 MG/3ML SOPN Inject 1 mg as directed once a week. 3 mL 2   umeclidinium-vilanterol (ANORO ELLIPTA ) 62.5-25 MCG/ACT AEPB Inhale 1 puff into the lungs daily. 3 each 3   magic mouthwash (multi-ingredient) oral suspension Take 5 ml by mouth 4 (four) times daily as needed for mouth pain. (Patient not taking: Reported on 09/10/2023) 480 mL 2   No  current facility-administered medications for this visit.     PHYSICAL EXAMINATION: ECOG PERFORMANCE STATUS: 1 - Symptomatic but completely ambulatory Vitals:   09/10/23  1327  BP: (!) 137/95  Pulse: 83  Resp: 18  Temp: (!) 96 F (35.6 C)  SpO2: 98%   There were no vitals filed for this visit.    Physical Exam Constitutional:      General: He is not in acute distress. Eyes:     General: No scleral icterus. Cardiovascular:     Rate and Rhythm: Normal rate and regular rhythm.  Pulmonary:     Effort: Pulmonary effort is normal. No respiratory distress.     Breath sounds: No wheezing.  Abdominal:     General: Bowel sounds are normal.     Palpations: Abdomen is soft.  Musculoskeletal:        General: No deformity. Normal range of motion.     Cervical back: Normal range of motion and neck supple.     Comments: Bilateral knee replacement.   Skin:    General: Skin is warm and dry.     Findings: No erythema or rash.  Neurological:     Mental Status: He is alert and oriented to person, place, and time. Mental status is at baseline.  Psychiatric:        Mood and Affect: Mood normal.     LABORATORY DATA:  I have reviewed the data as listed    Latest Ref Rng & Units 09/10/2023    1:17 PM 08/10/2023    8:16 AM 07/13/2023   10:54 AM  CBC  WBC 4.0 - 10.5 K/uL 7.0  6.7  6.3   Hemoglobin 13.0 - 17.0 g/dL 40.9  81.1  91.4   Hematocrit 39.0 - 52.0 % 46.1  46.2  45.1   Platelets 150 - 400 K/uL 207  218  136       Latest Ref Rng & Units 09/10/2023    1:17 PM 08/10/2023    8:16 AM 07/13/2023   10:54 AM  CMP  Glucose 70 - 99 mg/dL 92  782  956   BUN 6 - 20 mg/dL 15  20  19    Creatinine 0.61 - 1.24 mg/dL 2.13  0.86  5.78   Sodium 135 - 145 mmol/L 135  136  135   Potassium 3.5 - 5.1 mmol/L 3.9  4.2  4.1   Chloride 98 - 111 mmol/L 105  104  101   CO2 22 - 32 mmol/L 22  23  26    Calcium  8.9 - 10.3 mg/dL 8.9  9.3  8.7   Total Protein 6.5 - 8.1 g/dL 6.3  6.7  6.4   Total  Bilirubin 0.0 - 1.2 mg/dL 0.6  1.0  1.0   Alkaline Phos 38 - 126 U/L 71  77  72   AST 15 - 41 U/L 37  30  29   ALT 0 - 44 U/L 33  30  34     RADIOGRAPHIC STUDIES: I have personally reviewed the radiological images as listed and agreed with the findings in the report.  CT CHEST ABDOMEN PELVIS WO CONTRAST Result Date: 09/02/2023 CLINICAL DATA:  History of metastatic renal cell carcinoma, follow-up. * Tracking Code: BO * EXAM: CT CHEST, ABDOMEN AND PELVIS WITHOUT CONTRAST TECHNIQUE: Multidetector CT imaging of the chest, abdomen and pelvis was performed following the standard protocol without IV contrast. RADIATION DOSE REDUCTION: This exam was performed according to the departmental dose-optimization program which includes automated exposure control, adjustment of the mA and/or kV according to patient size and/or use of iterative reconstruction technique. COMPARISON:  Multiple priors including most recent  CT May 24, 2023 FINDINGS: CT CHEST FINDINGS Cardiovascular: Aortic atherosclerosis. Normal size heart. No significant pericardial effusion/thickening. Coronary artery calcifications. Mediastinum/Nodes: Decreased size of the mediastinal lymph nodes. For reference: -low right paratracheal/precarinal lymph node measures 10 mm in short axis on image 29/2 previously 14 mm. -subcarinal lymph node measures 10 mm in short axis on image 29/2 previously 16 mm. No suspicious thyroid  nodule. Patulous esophagus with mild symmetric distal esophageal wall thickening. Lungs/Pleura: Interval resolution of the clustered nodularity interposed with interstitial prominence and ground-glass in the right upper lobe. Scattered atelectasis/scarring. Stable scattered pulmonary nodules. No new suspicious pulmonary nodules or masses. For reference: -nodule in the right lung apex measures 3 mm on image 29/4, unchanged. -left upper lobe pulmonary nodule measures 1-2 mm on image 47/4 previously 3 mm. -part solid nodule in the  anteromedial left upper lobe is no longer confidently identified previously measuring 5 mm. Musculoskeletal: No aggressive lytic or blastic lesion of bone. CT ABDOMEN PELVIS FINDINGS Hepatobiliary: No suspicious hepatic lesion on noncontrast enhanced examination. Gallbladder is unremarkable. No biliary ductal dilation. Pancreas: No pancreatic ductal dilation or evidence of acute inflammation. Spleen: No splenomegaly or focal splenic lesion. Adrenals/Urinary Tract: No suspicious adrenal nodule/mass. Right kidney surgically absent. Stable soft tissue nodularity along the posterior aspect of the surgical bed measuring 2.4 x 1.0 cm on image 66/2 previously 2.5 x 0.9 cm. This is stable dating back to Sep 29, 2020. New suspicious nodularity in the nephrectomy bed. No left hydronephrosis. Stable 15 mm hyperdense left renal lesion on image 69/2 which measures Hounsfield units of 58 on today's examination but was previously characterized as a hemorrhagic/proteinaceous renal cyst. Urinary bladder is unremarkable for degree of distension. Stomach/Bowel: Stomach is unremarkable for degree of distension. No pathologic dilation of small or large bowel. Normal appendix. No evidence of acute bowel inflammation. Vascular/Lymphatic: Aortic atherosclerosis. Smooth IVC contours. No pathologically enlarged abdominal or pelvic lymph nodes. Reproductive: Prostate is unremarkable. Other: No significant abdominopelvic free fluid. Musculoskeletal: No aggressive lytic or blastic lesion of bone. Mild multilevel degenerative changes spine. IMPRESSION: 1. Interval resolution of the clustered nodularity interposed with interstitial prominence and ground-glass in the right upper lobe, likely infectious/inflammatory. 2. Stable scattered pulmonary nodules. No new suspicious pulmonary nodules or masses. 3. Decreased size of the mediastinal lymph nodes. 4. Stable soft tissue nodularity along the posterior aspect of the right nephrectomy bed dating  back to Sep 29, 2020. 5. No evidence of new or progressive disease in the chest, abdomen or pelvis. 6. Stable 15 mm hyperdense left renal lesion previously characterized as a hemorrhagic/proteinaceous cyst, but does not meet this criteria on today's noncontrast enhanced single-phase examination. Suggest continued attention on follow-up imaging. 7. Patulous esophagus with mild symmetric distal esophageal wall thickening, which may reflect esophagitis. 8.  Aortic Atherosclerosis (ICD10-I70.0). Electronically Signed   By: Tama Fails M.D.   On: 09/02/2023 11:06

## 2023-09-20 ENCOUNTER — Other Ambulatory Visit: Payer: Self-pay | Admitting: Internal Medicine

## 2023-09-20 DIAGNOSIS — K219 Gastro-esophageal reflux disease without esophagitis: Secondary | ICD-10-CM

## 2023-09-21 NOTE — Telephone Encounter (Signed)
 Requested Prescriptions  Pending Prescriptions Disp Refills   omeprazole  (PRILOSEC) 20 MG capsule [Pharmacy Med Name: OMEPRAZOL RX CAP 20MG ] 90 capsule 0    Sig: TAKE 1 CAPSULE DAILY AS    NEEDED FOR ACID REFLUX     Gastroenterology: Proton Pump Inhibitors Passed - 09/21/2023  2:36 PM      Passed - Valid encounter within last 12 months    Recent Outpatient Visits           2 months ago Type 2 diabetes mellitus with hyperglycemia, without long-term current use of insulin  [facility])   [facility] Rockney Cid, DO       Future Appointments             In 2 weeks Rockney Cid, DO [facility], [facility]

## 2023-09-26 ENCOUNTER — Other Ambulatory Visit: Payer: Self-pay

## 2023-09-26 DIAGNOSIS — R0602 Shortness of breath: Secondary | ICD-10-CM

## 2023-09-27 ENCOUNTER — Other Ambulatory Visit: Payer: Self-pay | Admitting: Oncology

## 2023-09-27 ENCOUNTER — Ambulatory Visit (INDEPENDENT_AMBULATORY_CARE_PROVIDER_SITE_OTHER): Admitting: Pulmonary Disease

## 2023-09-27 ENCOUNTER — Encounter: Payer: Self-pay | Admitting: Pulmonary Disease

## 2023-09-27 ENCOUNTER — Ambulatory Visit: Admitting: Pulmonary Disease

## 2023-09-27 DIAGNOSIS — C641 Malignant neoplasm of right kidney, except renal pelvis: Secondary | ICD-10-CM

## 2023-09-27 DIAGNOSIS — R0602 Shortness of breath: Secondary | ICD-10-CM

## 2023-09-27 DIAGNOSIS — C7900 Secondary malignant neoplasm of unspecified kidney and renal pelvis: Secondary | ICD-10-CM

## 2023-09-27 DIAGNOSIS — Z87891 Personal history of nicotine dependence: Secondary | ICD-10-CM | POA: Diagnosis not present

## 2023-09-27 DIAGNOSIS — J4540 Moderate persistent asthma, uncomplicated: Secondary | ICD-10-CM | POA: Diagnosis not present

## 2023-09-27 LAB — PULMONARY FUNCTION TEST
DL/VA % pred: 106 %
DL/VA: 4.46 ml/min/mmHg/L
DLCO cor % pred: 102 %
DLCO cor: 30.34 ml/min/mmHg
DLCO unc % pred: 105 %
DLCO unc: 31.24 ml/min/mmHg
FEF 25-75 Post: 2.14 L/s
FEF 25-75 Pre: 2.18 L/s
FEF2575-%Change-Post: -1 %
FEF2575-%Pred-Post: 65 %
FEF2575-%Pred-Pre: 66 %
FEV1-%Change-Post: -2 %
FEV1-%Pred-Post: 79 %
FEV1-%Pred-Pre: 80 %
FEV1-Post: 3.11 L
FEV1-Pre: 3.18 L
FEV1FVC-%Change-Post: -2 %
FEV1FVC-%Pred-Pre: 90 %
FEV6-%Change-Post: 0 %
FEV6-%Pred-Post: 92 %
FEV6-%Pred-Pre: 93 %
FEV6-Post: 4.57 L
FEV6-Pre: 4.61 L
FEV6FVC-%Change-Post: 0 %
FEV6FVC-%Pred-Post: 103 %
FEV6FVC-%Pred-Pre: 104 %
FVC-%Change-Post: 0 %
FVC-%Pred-Post: 89 %
FVC-%Pred-Pre: 89 %
FVC-Post: 4.62 L
FVC-Pre: 4.61 L
Post FEV1/FVC ratio: 67 %
Post FEV6/FVC ratio: 99 %
Pre FEV1/FVC ratio: 69 %
Pre FEV6/FVC Ratio: 100 %
RV % pred: 136 %
RV: 3.16 L
TLC % pred: 105 %
TLC: 7.77 L

## 2023-09-27 MED ORDER — TRELEGY ELLIPTA 100-62.5-25 MCG/ACT IN AEPB: 1.0000 | INHALATION_SPRAY | Freq: Every day | RESPIRATORY_TRACT | 3 refills | Status: DC

## 2023-09-27 NOTE — Progress Notes (Signed)
 Full PFT completed today ? ?

## 2023-09-27 NOTE — Patient Instructions (Signed)
 Full PFT completed today ? ?

## 2023-09-27 NOTE — Progress Notes (Signed)
 Synopsis: Referred in by Annitta Kindler, MD   Subjective:   PATIENT ID: Matthew Woodard GENDER: male DOB: Feb 04, 1966, MRN: [id number]  No chief complaint on file.   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  Since then he has been followed with CT chest abdomen pelvis every 3 months.  He is presenting today to the pulmonary clinic as a referral from Dr. Wilhelmenia Harada for follow-up regarding lung nodule seen on recent CT.  His most recent CT chest in November 2024 revealed stable paratracheal and subcarinal adenopathy but this was compared to the CT chest from Aug 2024. Comparing it to the CT chest in Feb 2024 these mediastinal LN are new and significantly increased in size. There is also a new 6mm LUL nodule with ground glass component.   He reports being asymptomatic, even denies any symptoms of shortness of breath, chest pain, chest tightness.   09/27/2023 OV - Started on ANORO during last visit with significant response. Ran out couple of days and chest tightness as well as cough recurred immediately.   09/27/2023 PFTs - With signs of chronic bronchitis and normal DLCO.   Family history - Father with COPD  Social history - Previous smoker. Smoked 1 ppd for 35 years. Quit in 2015. Continues to smoke marijuana regularly.    ROS All systems were reviewed and are negative except for the above.  Objective:   There were no vitals filed for this visit.    on RA BMI Readings from Last 3 Encounters:  09/27/23 26.04 kg/m  08/10/23 27.27 kg/m  07/13/23 27.63 kg/m   Wt Readings from Last 3 Encounters:  09/27/23 192 lb (87.1 kg)  08/10/23 201 lb 1.6 oz (91.2 kg)  07/13/23 203 lb 11.2 oz (92.4 kg)    Physical Exam GEN: NAD HEENT: Supple Neck, Reactive Pupils, EOMI   CVS: Normal S1, Normal S2, RRR, No murmurs or ES appreciated  Lungs: Diffuse wheezing bilaterally  Abdomen: Soft, non tender, non distended, + BS  Extremities: Warm and well perfused, No edema  Skin: No suspicious lesions appreciated  Psych: Normal Affect  Labs and imaging were reviewed Ancillary Information   CBC    Component Value Date/Time   WBC 7.0 09/10/2023 1317   WBC 6.7 08/10/2023 0816   RBC 5.04 09/10/2023 1317   HGB 15.7 09/10/2023 1317   HCT 46.1 09/10/2023 1317   PLT 207 09/10/2023 1317   MCV 91.5 09/10/2023 1317   MCH 31.2 09/10/2023 1317   MCHC 34.1 09/10/2023 1317   RDW 15.7 (H) 09/10/2023 1317   LYMPHSABS 2.9 09/10/2023 1317   MONOABS 0.4 09/10/2023 1317   EOSABS 0.5 09/10/2023 1317   BASOSABS 0.0 09/10/2023 1317       Latest Ref Rng & Units 09/27/2023    9:39 AM  PFT Results  FVC-Pre L 4.61  P  FVC-Predicted Pre % 89  P  FVC-Post L 4.62  P  FVC-Predicted Post % 89  P  Pre FEV1/FVC % % 69  P  Post FEV1/FCV % % 67  P  FEV1-Pre L 3.18  P  FEV1-Predicted Pre % 80  P  FEV1-Post L 3.11  P  DLCO uncorrected ml/min/mmHg 31.24  P  DLCO UNC% % 105  P  DLCO corrected ml/min/mmHg 30.34  P  DLCO COR %Predicted % 102  P  DLVA Predicted % 106  P  TLC L 7.77  P  TLC % Predicted % 105  P  RV % Predicted % 136  P    P Preliminary result     Assessment & Plan:  Matthew Woodard is a pleasant 58 year old male patient with a past medical history of 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  Since then he has been followed with CT chest abdomen pelvis every 3 months.  He is presenting today to the pulmonary clinic as a referral from Dr. Wilhelmenia Harada for follow-up regarding lung nodule seen on recent CT.  #Metastatic RCC involving Mediastinal LN. Currently on Cabozantinib  40mg  Daily.  Repeat CT chest 07/2023 with significant apparent improvement.   []  Follows with Dr. Wilhelmenia Harada.   Moderate persistent asthma with some component of COPD (Chronic bronchitis) on recent PFTs  []  Switch to Trelegy 100 1 puff daily from Umeclidinium-Vilanterol [Anoro Ellipta ] 62.5-25 mvg 1 puff daily. Advised on mouth rinsing.  []  c/w Albuterol  2puffs Q6H as needed   RTC 6 months.   I spent 30 minutes caring for this patient today, including preparing to see the patient, obtaining a medical history , reviewing a separately obtained history, performing a medically appropriate examination and/or evaluation, counseling and educating the patient/family/caregiver, ordering medications, tests, or procedures, documenting clinical information in the electronic health record, and independently interpreting results (not separately reported/billed) and communicating results to the patient/family/caregiver  Annitta Kindler, MD [facility] 09/27/2023 10:18 AM

## 2023-10-01 ENCOUNTER — Ambulatory Visit: Payer: Self-pay | Admitting: Pulmonary Disease

## 2023-10-01 ENCOUNTER — Encounter: Payer: Self-pay | Admitting: Pulmonary Disease

## 2023-10-01 DIAGNOSIS — R0602 Shortness of breath: Secondary | ICD-10-CM

## 2023-10-01 MED ORDER — ALBUTEROL SULFATE HFA 108 (90 BASE) MCG/ACT IN AERS: 2.0000 | INHALATION_SPRAY | Freq: Four times a day (QID) | RESPIRATORY_TRACT | 3 refills | Status: DC | PRN

## 2023-10-02 ENCOUNTER — Other Ambulatory Visit: Payer: Self-pay

## 2023-10-02 DIAGNOSIS — R0602 Shortness of breath: Secondary | ICD-10-CM

## 2023-10-02 MED ORDER — ALBUTEROL SULFATE HFA 108 (90 BASE) MCG/ACT IN AERS: 2.0000 | INHALATION_SPRAY | Freq: Four times a day (QID) | RESPIRATORY_TRACT | 3 refills | Status: DC | PRN

## 2023-10-09 ENCOUNTER — Other Ambulatory Visit: Payer: Self-pay

## 2023-10-09 DIAGNOSIS — R0602 Shortness of breath: Secondary | ICD-10-CM

## 2023-10-09 MED ORDER — ALBUTEROL SULFATE HFA 108 (90 BASE) MCG/ACT IN AERS: 2.0000 | INHALATION_SPRAY | Freq: Four times a day (QID) | RESPIRATORY_TRACT | 3 refills | Status: DC | PRN

## 2023-10-09 MED ORDER — ALBUTEROL SULFATE HFA 108 (90 BASE) MCG/ACT IN AERS
2.0000 | INHALATION_SPRAY | Freq: Four times a day (QID) | RESPIRATORY_TRACT | 3 refills | Status: DC | PRN
Start: 2023-10-09 — End: 2023-10-10

## 2023-10-10 ENCOUNTER — Other Ambulatory Visit: Payer: Self-pay | Admitting: Pulmonary Disease

## 2023-10-10 ENCOUNTER — Ambulatory Visit: Payer: 59 | Admitting: Internal Medicine

## 2023-10-10 ENCOUNTER — Encounter: Payer: Self-pay | Admitting: Internal Medicine

## 2023-10-10 ENCOUNTER — Other Ambulatory Visit: Payer: Self-pay

## 2023-10-10 VITALS — BP 124/76 | HR 98 | Temp 97.8°F | Resp 16 | Ht 72.0 in | Wt 190.7 lb

## 2023-10-10 DIAGNOSIS — I10 Essential (primary) hypertension: Secondary | ICD-10-CM | POA: Diagnosis not present

## 2023-10-10 DIAGNOSIS — E1165 Type 2 diabetes mellitus with hyperglycemia: Secondary | ICD-10-CM | POA: Diagnosis not present

## 2023-10-10 DIAGNOSIS — Z7984 Long term (current) use of oral hypoglycemic drugs: Secondary | ICD-10-CM

## 2023-10-10 DIAGNOSIS — R0602 Shortness of breath: Secondary | ICD-10-CM

## 2023-10-10 LAB — POCT GLYCOSYLATED HEMOGLOBIN (HGB A1C): Hemoglobin A1C: 6.8 % — AB (ref 4.0–5.6)

## 2023-10-10 MED ORDER — SEMAGLUTIDE (1 MG/DOSE) 4 MG/3ML ~~LOC~~ SOPN
1.0000 mg | PEN_INJECTOR | SUBCUTANEOUS | 2 refills | Status: DC
Start: 2023-10-10 — End: 2024-01-18

## 2023-10-10 NOTE — Progress Notes (Signed)
 Established Patient Office Visit  Subjective    Patient ID: Matthew Woodard, male    DOB: 1966-04-21  Age: 58 y.o. MRN: [id number]  CC:  Chief Complaint  Patient presents with   Medical Management of Chronic Issues    3 month recheck    HPI Matthew Woodard presents to follow up.   Discussed the use of AI scribe software for clinical note transcription with the patient, who gave verbal consent to proceed.  History of Present Illness Matthew Woodard "Hilarie Lovely" is a 58 year old male with cancer who presents with concerns about his cancer medication and its side effects.  He is taking Cabometyx  at a 40 mg dose, reduced from 60 mg due to side effects including weakness and sores on both feet. He was previously off Cabometyx  for about a month and a half to recover from these side effects.  He experiences breathing difficulties and was switched from Anoro Ellipta  to Trelegy after a lung function test indicated asthma or bronchitis symptoms. His cough returned when he ran out of Anoro Ellipta , but improved with Trelegy.  His appetite has decreased significantly. He feels hungry but can only eat a few bites before losing interest in food. He has been on Ozempic  for several years and initially noticed a change in appetite, but it is more severe now.  His diabetes management includes Jardiance  and Ozempic , with a recent reduction in metformin  dosage to 500 mg once a day. Despite this reduction, his A1c has decreased to 6.8.  Hypertension: -Medications: Lisinopril  20 mg  -Patient is compliant with above medications and reports no side effects. -Denies any SOB, CP, vision changes, LE edema or symptoms of hypotension  HLD: -Medications: Crestor  20 mg -Patient is compliant with above medications and reports no side effects.  -Last lipid panel: Lipid Panel     Component Value Date/Time   CHOL 134 04/28/2022 1157   CHOL 157 11/02/2014 0825   CHOL 232 (H) 02/01/2013 0826   TRIG 103  04/28/2022 1157   TRIG 117 02/01/2013 0826   HDL 46 04/28/2022 1157   HDL 50 11/02/2014 0825   HDL 47 02/01/2013 0826   CHOLHDL 2.9 04/28/2022 1157   VLDL 28 01/08/2017 1012   VLDL 23 02/01/2013 0826   LDLCALC 69 04/28/2022 1157   LDLCALC 162 (H) 02/01/2013 0826   LABVLDL 27 11/02/2014 0825    Diabetes, Type 2: -Last A1c 7.1% 2/25 -Medications: Jardiance  25 mg, Ozempic  1 mg weekly, Metformin  500 mg XR decreased to BID  -Patient is compliant with the above medications and reports no side effects.  -Eye exam: UTD -Foot exam: UTD -Microalbumin: UTD -Statin: yes -PNA vaccine: declines -Denies symptoms of hypoglycemia, polyuria, polydipsia, numbness extremities, but did have some ulcers on his feet after new chemo regimen   Renal Cell Carcinoma:  -First diagnosed 06/2018, right kidney removed 07/2018; had spread to bladder and lungs but had been in remission for 1 year. -Following with Oncology Dr. Wilhelmenia Harada, has another appointment soon -Recent CT scan showing thoracic lymphadenopathy which was proven on biopsy to show recurrent metastatic RCC -Now on Cabozantinib  40 mg daily but feeling very tired  GERD/Barrett's Esophagus: -Currently on Prilosec 20 mg PRN  Health Maintenance: -Colon cancer screening: colonoscopy 10/2021, repeat in 7 years -Blood work UTD   Outpatient Encounter Medications as of 10/10/2023  Medication Sig   albuterol  (VENTOLIN  HFA) 108 (90 Base) MCG/ACT inhaler Inhale 2 puffs into the lungs every 6 (  six) hours as needed for wheezing or shortness of breath.   ALPRAZolam  (XANAX ) 0.5 MG tablet Take 1 tablet (0.5 mg total) by mouth 2 (two) times daily as needed for anxiety or sleep.   CABOMETYX  40 MG tablet TAKE 1 TABLET BY MOUTH 1 TIME A DAY. TAKE ON AN EMPTY STOMACH, 1 HOUR BEFORE OR 2 HOURS AFTER MEALS.   clobetasol  ointment (TEMOVATE ) 0.05 % Apply 1 Application topically 2 (two) times daily as needed.   empagliflozin  (JARDIANCE ) 25 MG TABS tablet Take 1 tablet (25 mg  total) by mouth daily before breakfast.   fluticasone  (FLONASE ) 50 MCG/ACT nasal spray Place 2 sprays into both nostrils daily.   Fluticasone -Umeclidin-Vilant (TRELEGY ELLIPTA ) 100-62.5-25 MCG/ACT AEPB Inhale 1 puff into the lungs daily.   lisinopril  (ZESTRIL ) 20 MG tablet Take 1 tablet (20 mg total) by mouth daily.   metFORMIN  (GLUCOPHAGE -XR) 500 MG 24 hr tablet Take 2 tablets (1,000 mg total) by mouth 2 (two) times daily with a meal.   omeprazole  (PRILOSEC) 20 MG capsule TAKE 1 CAPSULE DAILY AS    NEEDED FOR ACID REFLUX   ondansetron  (ZOFRAN ) 8 MG tablet Take 1 tablet (8 mg total) by mouth every 8 (eight) hours as needed for nausea or vomiting.   prochlorperazine  (COMPAZINE ) 10 MG tablet Take 1 tablet (10 mg total) by mouth every 6 (six) hours as needed.   rosuvastatin  (CRESTOR ) 20 MG tablet Take 1 tablet (20 mg total) by mouth daily with supper.   Semaglutide , 1 MG/DOSE, 4 MG/3ML SOPN Inject 1 mg as directed once a week.   aspirin 81 MG EC tablet Take 81 mg by mouth daily with supper. (Patient not taking: Reported on 09/27/2023)   glucose blood test strip Use as instructed (Patient not taking: Reported on 10/10/2023)   magic mouthwash (multi-ingredient) oral suspension Take 5 ml by mouth 4 (four) times daily as needed for mouth pain. (Patient not taking: Reported on 10/10/2023)   Multiple Vitamin (MULTIVITAMIN) tablet Take 1 tablet by mouth daily with supper. (Patient not taking: Reported on 09/27/2023)   No facility-administered encounter medications on file as of 10/10/2023.    Past Medical History:  Diagnosis Date   Arthritis    hands, left knee   Cancer (HCC)    COPD (chronic obstructive pulmonary disease) (HCC)    Diabetes mellitus without complication (HCC)    Eczema 01/08/2017   GERD (gastroesophageal reflux disease)    History of kidney cancer    Hyperlipidemia LDL goal <100 11/02/2014   Hypertension    Kidney stone    YEAR H/O HEMATURIA   Psoriasis    Wears dentures    full  upper, partial lower.  Has, does not wear    Past Surgical History:  Procedure Laterality Date   BRONCHIAL NEEDLE ASPIRATION BIOPSY  05/03/2023   Procedure: BRONCHIAL NEEDLE ASPIRATION BIOPSIES;  Surgeon: Annitta Kindler, MD;  Location: [facility];  Service: Pulmonary;;   COLONOSCOPY N/A 10/21/2021   Procedure: COLONOSCOPY;  Surgeon: Marnee Sink, MD;  Location: [facility];  Service: Endoscopy;  Laterality: N/A;   COLONOSCOPY WITH PROPOFOL  N/A 02/23/2017   Procedure: COLONOSCOPY WITH PROPOFOL ;  Surgeon: Marnee Sink, MD;  Location: [facility];  Service: Endoscopy;  Laterality: N/A;  Diabetic - oral meds   CYST EXCISION  12/2014   on back   CYSTOSCOPY W/ RETROGRADES Right 06/28/2018   Procedure: CYSTOSCOPY WITH RETROGRADE PYELOGRAM;  Surgeon: Lawerence Pressman, MD;  Location: [facility] ORS;  Service: Urology;  Laterality: Right;  CYSTOSCOPY W/ URETERAL STENT REMOVAL Right 07/22/2018   Procedure: CYSTOSCOPY WITH STENT REMOVAL;  Surgeon: Lawerence Pressman, MD;  Location: [facility];  Service: Urology;  Laterality: Right;   CYSTOSCOPY WITH BIOPSY Right 06/28/2018   Procedure: CYSTOSCOPY WITH RENAL BIOPSY;  Surgeon: Lawerence Pressman, MD;  Location: [facility];  Service: Urology;  Laterality: Right;   CYSTOSCOPY WITH URETEROSCOPY AND STENT PLACEMENT Right 06/28/2018   Procedure: CYSTOSCOPY WITH URETEROSCOPY AND STENT PLACEMENT;  Surgeon: Lawerence Pressman, MD;  Location: [facility];  Service: Urology;  Laterality: Right;   ESOPHAGOGASTRODUODENOSCOPY N/A 10/21/2021   Procedure: ESOPHAGOGASTRODUODENOSCOPY (EGD);  Surgeon: Marnee Sink, MD;  Location: [facility];  Service: Endoscopy;  Laterality: N/A;   FULGURATION OF BLADDER TUMOR N/A 06/28/2018   Procedure: CYSTOSCOPY BLADDER BIOPSY, FULGERATION OF BLADDER;  Surgeon: Lawerence Pressman, MD;  Location: [facility];  Service: Urology;  Laterality: N/A;   JOINT REPLACEMENT Left    KNEE SURGERY Left    LAPAROSCOPIC NEPHRECTOMY, HAND  ASSISTED Right 07/22/2018   Procedure: HAND ASSISTED LAPAROSCOPIC NEPHRECTOMY;  Surgeon: Lawerence Pressman, MD;  Location: [facility];  Service: Urology;  Laterality: Right;   NASAL SINUS SURGERY  07/2013   POLYPECTOMY N/A 02/23/2017   Procedure: POLYPECTOMY;  Surgeon: Marnee Sink, MD;  Location: [facility];  Service: Endoscopy;  Laterality: N/A;   POLYPECTOMY  10/21/2021   Procedure: POLYPECTOMY;  Surgeon: Marnee Sink, MD;  Location: [facility];  Service: Endoscopy;;   REPLACEMENT TOTAL KNEE Right    tooth abstraction     TRANSURETHRAL RESECTION OF BLADDER TUMOR N/A 05/12/2019   Procedure: TRANSURETHRAL RESECTION OF BLADDER TUMOR (TURBT);  Surgeon: Lawerence Pressman, MD;  Location: [facility];  Service: Urology;  Laterality: N/A;   VARICOCELE EXCISION     VIDEO BRONCHOSCOPY WITH ENDOBRONCHIAL ULTRASOUND N/A 05/03/2023   Procedure: VIDEO BRONCHOSCOPY WITH ENDOBRONCHIAL ULTRASOUND;  Surgeon: Annitta Kindler, MD;  Location: [facility];  Service: Pulmonary;  Laterality: N/A;   XI ROBOTIC ASSISTED VENTRAL HERNIA N/A 11/18/2020   Procedure: XI ROBOTIC ASSISTED VENTRAL HERNIA, incisional;  Surgeon: Alben Alma, MD;  Location: [facility];  Service: General;  Laterality: N/A;    Family History  Problem Relation Age of Onset   Heart disease Father    Hypertension Father    COPD Father    Colon cancer Maternal Grandmother    Heart attack Paternal Grandfather     Social History   Socioeconomic History   Marital status: Married    Spouse name: Lonzell Robin   Number of children: Not on file   Years of education: Not on file   Highest education level: Not on file  Occupational History   Not on file  Tobacco Use   Smoking status: Former    Current packs/day: 0.00    Types: Cigarettes    Quit date: 06/15/2013    Years since quitting: 10.3   Smokeless tobacco: Never  Vaping Use   Vaping status: Never Used  Substance and Sexual Activity   Alcohol use: No    Alcohol/week:  0.0 standard drinks of alcohol   Drug use: Yes    Types: Marijuana   Sexual activity: Yes  Other Topics Concern   Not on file  Social History Narrative   Lives with wife, sister in law   Social Drivers of Health   Financial Resource Strain: Low Risk  (09/13/2021)   Received [facility], [facility]   Overall Financial Resource Strain (CARDIA)  Difficulty of Paying Living Expenses: Not hard at all  Food Insecurity: No Food Insecurity (09/13/2021)   Received from [facility], [facility]   Hunger Vital Sign    Worried About Running Out of Food in the Last Year: Never true    Ran Out of Food in the Last Year: Never true  Transportation Needs: No Transportation Needs (09/13/2021)   Received [facility], [facility]   [facility] - Transportation    Lack of Transportation (Medical): No    Lack of Transportation (Non-Medical): No  Physical Activity: Not on file  Stress: Not on file  Social Connections: Not on file  Intimate Partner Violence: Not on file    Review of Systems  Constitutional:  Negative for chills and fever.  Eyes:  Negative for blurred vision.  Respiratory:  Negative for sputum production.   Cardiovascular:  Negative for chest pain.  Neurological:  Negative for headaches.        Objective    BP 124/76 (Cuff Size: Large)   Pulse 98   Temp 97.8 F (36.6 C) (Oral)   Resp 16   Ht 6' (1.829 m)   Wt 190 lb 11.2 oz (86.5 kg)   SpO2 98%   BMI 25.86 kg/m   Physical Exam Constitutional:      Appearance: Normal appearance.  HENT:     Head: Normocephalic and atraumatic.  Eyes:     Conjunctiva/sclera: Conjunctivae normal.  Cardiovascular:     Rate and Rhythm: Normal rate and regular rhythm.     Pulses:          Dorsalis pedis pulses are 2+ on the right side and 2+ on the left side.  Pulmonary:     Effort: Pulmonary effort is normal.     Breath sounds: Normal breath sounds.  Musculoskeletal:     Right foot: Normal range of  motion. No deformity, bunion, Charcot foot, foot drop or prominent metatarsal heads.     Left foot: Normal range of motion. No deformity, bunion, Charcot foot, foot drop or prominent metatarsal heads.  Feet:     Right foot:     Protective Sensation: 6 sites tested.  6 sites sensed.     Skin integrity: Skin integrity normal.     Toenail Condition: Right toenails are normal.     Left foot:     Protective Sensation: 6 sites tested.  6 sites sensed.     Skin integrity: Skin integrity normal.     Toenail Condition: Left toenails are normal.  Skin:    General: Skin is warm and dry.  Neurological:     General: No focal deficit present.     Mental Status: He is alert. Mental status is at baseline.  Psychiatric:        Mood and Affect: Mood normal.        Behavior: Behavior normal.         Assessment & Plan:   Assessment & Plan Cancer under active treatment Experiencing side effects from Cabometyx , including weakness and foot sores. Medication effective in reducing lymph nodes and lung nodules. Willing to tolerate side effects unless severe. Possible medication adjustment after oncology appointment.  Type 2 diabetes mellitus A1c decreased to 6.8 with reduced metformin . Current regimen includes metformin , Jardiance , and Ozempic . Appetite issues possibly due to Ozempic , but manageable. Discussed reducing Ozempic  if appetite worsens, with potential A1c increase if cancer medication changes. - Continue current diabetes management regimen. - Send refill for  Ozempic .  Asthma Improved symptoms with Trelegy after switch from Anoro Ellipta . Return of cough when off Anoro Ellipta  suggests underlying asthma or allergy.  HTN - Blood pressure stable here today, no changes made to medications.   - POCT HgB A1C - Semaglutide , 1 MG/DOSE, 4 MG/3ML SOPN; Inject 1 mg as directed once a week.  Dispense: 3 mL; Refill: 2   Return in about 6 months (around 04/11/2024).   Rockney Cid, DO

## 2023-10-10 NOTE — Telephone Encounter (Signed)
 They are asking for a change in the Ventolin . Ventolin  is not covered by his insurance.

## 2023-10-11 ENCOUNTER — Other Ambulatory Visit (HOSPITAL_COMMUNITY): Payer: Self-pay

## 2023-10-11 ENCOUNTER — Telehealth: Payer: Self-pay

## 2023-10-11 NOTE — Telephone Encounter (Signed)
 Noted. Nothing further needed.

## 2023-10-11 NOTE — Telephone Encounter (Signed)
 Preferred is Albuterol  6.7gm- per test claim pharmacy is already filling this version and refill is too soon.

## 2023-10-12 ENCOUNTER — Other Ambulatory Visit: Payer: Self-pay

## 2023-10-12 ENCOUNTER — Encounter: Payer: Self-pay | Admitting: Oncology

## 2023-10-15 ENCOUNTER — Inpatient Hospital Stay (HOSPITAL_BASED_OUTPATIENT_CLINIC_OR_DEPARTMENT_OTHER): Admitting: Oncology

## 2023-10-15 ENCOUNTER — Encounter: Payer: Self-pay | Admitting: Oncology

## 2023-10-15 ENCOUNTER — Inpatient Hospital Stay: Attending: Oncology

## 2023-10-15 VITALS — BP 136/94 | HR 67 | Temp 96.0°F | Resp 18 | Wt 192.0 lb

## 2023-10-15 DIAGNOSIS — T451X5A Adverse effect of antineoplastic and immunosuppressive drugs, initial encounter: Secondary | ICD-10-CM

## 2023-10-15 DIAGNOSIS — Z9226 Personal history of immune checkpoint inhibitor therapy: Secondary | ICD-10-CM | POA: Diagnosis not present

## 2023-10-15 DIAGNOSIS — C7911 Secondary malignant neoplasm of bladder: Secondary | ICD-10-CM | POA: Insufficient documentation

## 2023-10-15 DIAGNOSIS — Z905 Acquired absence of kidney: Secondary | ICD-10-CM | POA: Insufficient documentation

## 2023-10-15 DIAGNOSIS — C641 Malignant neoplasm of right kidney, except renal pelvis: Secondary | ICD-10-CM | POA: Insufficient documentation

## 2023-10-15 DIAGNOSIS — C771 Secondary and unspecified malignant neoplasm of intrathoracic lymph nodes: Secondary | ICD-10-CM | POA: Diagnosis present

## 2023-10-15 DIAGNOSIS — L271 Localized skin eruption due to drugs and medicaments taken internally: Secondary | ICD-10-CM | POA: Diagnosis not present

## 2023-10-15 DIAGNOSIS — K521 Toxic gastroenteritis and colitis: Secondary | ICD-10-CM | POA: Diagnosis not present

## 2023-10-15 DIAGNOSIS — Z87891 Personal history of nicotine dependence: Secondary | ICD-10-CM | POA: Diagnosis not present

## 2023-10-15 LAB — CBC WITH DIFFERENTIAL (CANCER CENTER ONLY)
Abs Immature Granulocytes: 0.03 10*3/uL (ref 0.00–0.07)
Basophils Absolute: 0 10*3/uL (ref 0.0–0.1)
Basophils Relative: 1 %
Eosinophils Absolute: 0.1 10*3/uL (ref 0.0–0.5)
Eosinophils Relative: 2 %
HCT: 48.1 % (ref 39.0–52.0)
Hemoglobin: 16.2 g/dL (ref 13.0–17.0)
Immature Granulocytes: 1 %
Lymphocytes Relative: 35 %
Lymphs Abs: 2.3 10*3/uL (ref 0.7–4.0)
MCH: 31.8 pg (ref 26.0–34.0)
MCHC: 33.7 g/dL (ref 30.0–36.0)
MCV: 94.5 fL (ref 80.0–100.0)
Monocytes Absolute: 0.4 10*3/uL (ref 0.1–1.0)
Monocytes Relative: 7 %
Neutro Abs: 3.6 10*3/uL (ref 1.7–7.7)
Neutrophils Relative %: 54 %
Platelet Count: 189 10*3/uL (ref 150–400)
RBC: 5.09 MIL/uL (ref 4.22–5.81)
RDW: 15.5 % (ref 11.5–15.5)
WBC Count: 6.5 10*3/uL (ref 4.0–10.5)
nRBC: 0 % (ref 0.0–0.2)

## 2023-10-15 LAB — CMP (CANCER CENTER ONLY)
ALT: 38 U/L (ref 0–44)
AST: 32 U/L (ref 15–41)
Albumin: 3.8 g/dL (ref 3.5–5.0)
Alkaline Phosphatase: 79 U/L (ref 38–126)
Anion gap: 9 (ref 5–15)
BUN: 17 mg/dL (ref 6–20)
CO2: 23 mmol/L (ref 22–32)
Calcium: 8.7 mg/dL — ABNORMAL LOW (ref 8.9–10.3)
Chloride: 102 mmol/L (ref 98–111)
Creatinine: 1 mg/dL (ref 0.61–1.24)
GFR, Estimated: >60 mL/min (ref >60)
Glucose, Bld: 107 mg/dL — ABNORMAL HIGH (ref 70–99)
Potassium: 4.2 mmol/L (ref 3.5–5.1)
Sodium: 134 mmol/L — ABNORMAL LOW (ref 135–145)
Total Bilirubin: 0.8 mg/dL (ref 0.0–1.2)
Total Protein: 6.6 g/dL (ref 6.5–8.1)

## 2023-10-15 MED ORDER — ALPRAZOLAM 0.5 MG PO TABS: 0.5000 mg | ORAL_TABLET | Freq: Two times a day (BID) | ORAL | 0 refills | Status: DC | PRN

## 2023-10-15 NOTE — Assessment & Plan Note (Signed)
 Stable symptoms  Recommend imodium  PRN as directed.

## 2023-10-15 NOTE — Assessment & Plan Note (Signed)
 Mild symptoms.  Continue emollient creams or lotions containing urea topically

## 2023-10-15 NOTE — Assessment & Plan Note (Signed)
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  Recent CT showed thoracic lymphadenopathy which were biopsy proven recurrent metastatic RCC 2nd line treatment with cabozantinib  60mg  daily--> did not tolerate, after 3 weeks chemo break  switched to 40mg  daily on 07/29/2023  Labs are reviewed and discussed with patient. Recommend patient to hold off cabozantinib  40mg  daily due to severe fatigue. He may resume in 3 weeks and follow up in 6 weeks.

## 2023-10-15 NOTE — Progress Notes (Signed)
 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  Recent CT showed thoracic lymphadenopathy which were biopsy proven recurrent metastatic RCC 2nd line treatment with cabozantinib  60mg  daily--> did not tolerate, after 3 weeks chemo break  switched to 40mg  daily on 07/29/2023  Labs are reviewed and discussed with patient. Recommend patient to hold off cabozantinib  40mg  daily due to severe fatigue. He may resume in 3 weeks and follow up in 6 weeks.       Chemotherapy induced diarrhea Stable symptoms  Recommend imodium  PRN as directed.   Palmar plantar erythrodysesthesia Mild symptoms.  Continue emollient creams or lotions containing urea topically   Orders Placed This Encounter  Procedures   CBC with Differential (Cancer Center Only)    Standing Status:   Standing    Number of Occurrences:   1    Expiration Date:   10/14/2024   CMP (Cancer Center only)    Standing Status:   Standing    Number of Occurrences:   1    Expiration Date:   10/14/2024   Lactate dehydrogenase    Standing Status:   Standing    Number of Occurrences:   1    Expiration Date:   10/14/2024   Follow up: 4 weeks.  All questions were answered. The patient knows to call the clinic with any problems, questions or concerns.  [name], MD, PhD [facility] Hematology Oncology 10/15/2023     HISTORY OF PRESENTING ILLNESS:   Matthew Woodard is a  58 y.o.  male presents for follow up for metastatic kidney cancer Oncology History  Metastatic renal cell carcinoma (HCC)  06/2018 Imaging   CT urogram done which showed right 4 cm central enhancing renal mass with invasion into the collecting system. X-ray of chest was performed to see  complete staging which showed no concerning findings   06/28/2018 Cancer Diagnosis      06/28/2018 Procedure    patient underwent a cystoscopy, bladder biopsy and a right retrograde pyelogram with intraoperative interpretation, right diagnostic ureteroscopy, right renal pelvis biopsy, right ureteral stent placement.  biopsy showed atypical cell clusters, with extensive crush artifact, nondiagnostic.     07/22/2018 Initial Diagnosis   Patient underwent right radical laparoscopic nephrectomy with biopsy showing 5.3 cm RCC, clear cell type, grade 3, tumor invades renal vein and segmental branches, pelvic calyceal system and perirenal sinus/fat.  Surgical margins negative for tumor.pT3a,Nx Post-operation, patient was on surveillance after surgery.   10/28/2018 Imaging   CT abdomen pelvis with contrast showed minimal fluid and or postoperative changes within the right renal fossa.  No evidence of metastatic disease or recurrence.  Patient has left kidney lesion previously characterized as nonenhancing cyst by multi phasic contrast-enhanced CT.   04/30/2019 Progression   Patient reports an episode of gross hematuria in early December 2020, patient is due for 44-month surveillance CT scan. 04/30/2019 CT abdomen pelvis with contrast showed high attenuation mass at the right uretero vesicle junction, with extension into the bladder.  Bilateral pulmonary nodules, most indicated for metastatic disease.  Patient underwent cystoscopy and transurethral resection of irregular bladder tumor 3 cm.  Mass appeared to emanate from the right ureteral orifice.  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  #NGS: [employer]  PD L1 TPS 1%   05/28/2019 Imaging   CT chest w contrast 1. Numerous bilateral pulmonary nodules measuring up to 10 mm diameter. Imaging features compatible with metastatic disease. 2. Left hilar lymphadenopathy, also highly suspicious for metastatic  involvement.    05/28/2019 Imaging   Bone scan showed  No scintigraphic evidence of osseous metastatic disease.    06/05/2019 - 06/21/2021 Chemotherapy   06/05/19 Axitinib  5mg  BID+ Keytruda   09/2019 off axitinib  and Keytruda  since the beginning of May due to transaminitis. GI work up negative.  Ultrasound liver vascular Doppler is negative #11/24/2019, resumed on Keytruda  every 3 weeks #01/05/2020, axitinib  was resumed 3 mg #01/20/2020, axitinib  was discontinued due to recurrence of transaminitis.  Keytruda  was temporarily held. #03/16/2020-06/21/2021 resumed on Keytruda  every 3 weeks Keytruda  was discontinued due to recurrent immunotherapy induced diarrhea    07/25/2019 Imaging   Brain w wo contrast No mass, hemorrhage, or abnormal enhancement.   09/29/2020 Imaging   CT scan showed no evidence of local recurrence or new/progressive disease in the chest abdomen or pelvis.  Tiny nonspecific lung nodules are unchanged.  No new lung nodules   01/14/2021 Imaging   CT chest abdomen pelvis showed stable exam, no new or progressive metastatic disease within chest abdomen pelvis. Stable lung nodules. Aortic atherosclerosis.    04/15/2021 Imaging   CT chest abdomen pelvis with contrast showed stable examination.  No evidence of recurrent or metastatic carcinoma within the chest abdomen pelvis.     08/02/2021 Imaging   CT chest abdomen pelvis without contrast, stable examination without evidence of recurrence or metastatic disease.  Mild asymmetric diffuse esophageal wall thickening, similar to prior examination.  Likely chronic esophagitis.  Aortic atherosclerosis   10/21/2021 Procedure   colonoscopy showed 3 mm polyp which was resected and retrieved.  Negative for dysplasia/malignancy.  Random biopsy showed lymphocytic colitis. EGD showed long segment Barrett's esophagus.  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  Negative for dysplasia and malignancy.  Small bowel  cold biopsy showed reactive duodenitis.  Negative for CMV.  Negative for dysplasia or malignancy.   11/16/2021 Imaging   CT chest abdomen pelvis without contrast showed stable examination without evidence of local recurrence or active malignant disease in the chest abdomen pelvis.  Reflux retained contrast in the esophagus suggestive of esophagitis.  Aortic atherosclerosis   07/03/2022 Imaging   CT chest abdomen pelvis w contrast   1. Stable examination status post right nephrectomy without evidence of new or progressive disease in the chest, abdomen or pelvis. 2. Stable scattered tiny pulmonary nodules favored benign etiology. 3.  Aortic Atherosclerosis    01/04/2023 Imaging   CT chest abdomen pelvis wo contrast showed  1. New clustered nodularity and ground-glass in the peripheral right lower lobe, likely infectious/inflammatory. Additionally there are new enlarged mediastinal lymph nodes which given the above findings are favored reactive but nonspecific. Suggest attention on short-term interval follow-up dedicated chest CT to ensure resolution. 2. Stable scattered tiny pulmonary nodules. 3. No evidence of local recurrence or metastatic disease in the abdomen or pelvis. 4.  Aortic Atherosclerosis   04/09/2023 Imaging   CT chest wo contrast  1. Persistent right paratracheal and subcarinal adenopathy. These are stable to slightly increased in the interval (on the order of 1-2 mm). 2. Previously noted left upper lobe and right upper lobe lung nodules are not significantly changed in the interval. 3. New part solid nodule within the anterior left upper lobe measures 6 mm. The appearance  is nonspecific. This may be postinflammatory or infectious in etiology. Consider short-term interval follow-up with repeat CT of the chest in 3 months to ensure resolution. 4. Stable appearance of focal area of ground-glass attenuation and interstitial prominence within the periphery of the right  upper lobe. Favor sequelae of inflammation or infection 5. Coronary artery calcifications. 6.  Aortic Atherosclerosis    05/03/2023 Relapse/Recurrence   Biopsy via bronchoscopy  A. LYMPH NODE, 4R, FINE NEEDLE ASPIRATION:  - Carcinoma, consistent with renal cell carcinoma  - No definitive lymphoid tissue identified   B. LYMPH NODE, 7, FINE NEEDLE ASPIRATION:  - Carcinoma, consistent with renal cell carcinoma  - Lymphoid tissue identified   C. LYMPH NODE, 11R, FINE NEEDLE ASPIRATION:  - Carcinoma, consistent with renal cell carcinoma  - Lymphoid tissue identified    05/30/2023 - 07/09/2023 Chemotherapy   Cabozantinib  60mg  daily, stopped due to mucositis, palmar plantar erythrodysesthesia   07/29/2023 -  Chemotherapy   Cabozantinib  40mg  daily   Clear cell carcinoma of right kidney (HCC)     #History of cutaneous psoriasis, mostly on his right hand,    INTERVAL HISTORY Matthew Woodard is a 58 y.o. male who has above history reviewed by me today presents for follow up visit for management of metastatic RCC. Patient reports doing well. + Chronic cough, stable He has been on cabozantinib  40mg  daily since 07/29/2023,   Intermittent diarrhea managed by imodium  and symptoms are controlled well.  + severe fatigue.    Review of Systems  Constitutional:  Positive for fatigue. Negative for appetite change, chills, fever and unexpected weight change.  HENT:   Negative for hearing loss and voice change.   Eyes:  Negative for eye problems and icterus.  Respiratory:  Negative for chest tightness, cough and shortness of breath.   Cardiovascular:  Negative for chest pain and leg swelling.  Gastrointestinal:  Negative for abdominal distention, abdominal pain, blood in stool, diarrhea and nausea.  Endocrine: Negative for hot flashes.  Genitourinary:  Negative for difficulty urinating, dysuria and frequency.   Musculoskeletal:        Bilateral knee replacement.   Skin:  Negative for  itching and rash.  Neurological:  Negative for extremity weakness, headaches, light-headedness and numbness.  Hematological:  Negative for adenopathy. Does not bruise/bleed easily.  Psychiatric/Behavioral:  Negative for confusion.     MEDICAL HISTORY:  Past Medical History:  Diagnosis Date   Arthritis    hands, left knee   Cancer (HCC)    COPD (chronic obstructive pulmonary disease) (HCC)    Diabetes mellitus without complication (HCC)    Eczema 01/08/2017   GERD (gastroesophageal reflux disease)    History of kidney cancer    Hyperlipidemia LDL goal <100 11/02/2014   Hypertension    Kidney stone    YEAR H/O HEMATURIA   Psoriasis    Wears dentures    full upper, partial lower.  Has, does not wear    SURGICAL HISTORY: Past Surgical History:  Procedure Laterality Date   BRONCHIAL NEEDLE ASPIRATION BIOPSY  05/03/2023   Procedure: BRONCHIAL NEEDLE ASPIRATION BIOPSIES;  Surgeon: Annitta Kindler, MD;  Location: [facility];  Service: Pulmonary;;   COLONOSCOPY N/A 10/21/2021   Procedure: COLONOSCOPY;  Surgeon: Marnee Sink, MD;  Location: [facility];  Service: Endoscopy;  Laterality: N/A;   COLONOSCOPY WITH PROPOFOL  N/A 02/23/2017   Procedure: COLONOSCOPY WITH PROPOFOL ;  Surgeon: Marnee Sink, MD;  Location: [facility];  Service: Endoscopy;  Laterality: N/A;  Diabetic -  oral meds   CYST EXCISION  12/2014   on back   CYSTOSCOPY W/ RETROGRADES Right 06/28/2018   Procedure: CYSTOSCOPY WITH RETROGRADE PYELOGRAM;  Surgeon: Lawerence Pressman, MD;  Location: [facility];  Service: Urology;  Laterality: Right;   CYSTOSCOPY W/ URETERAL STENT REMOVAL Right 07/22/2018   Procedure: CYSTOSCOPY WITH STENT REMOVAL;  Surgeon: Lawerence Pressman, MD;  Location: [facility];  Service: Urology;  Laterality: Right;   CYSTOSCOPY WITH BIOPSY Right 06/28/2018   Procedure: CYSTOSCOPY WITH RENAL BIOPSY;  Surgeon: Lawerence Pressman, MD;  Location: [facility];  Service: Urology;  Laterality:  Right;   CYSTOSCOPY WITH URETEROSCOPY AND STENT PLACEMENT Right 06/28/2018   Procedure: CYSTOSCOPY WITH URETEROSCOPY AND STENT PLACEMENT;  Surgeon: Lawerence Pressman, MD;  Location: [facility];  Service: Urology;  Laterality: Right;   ESOPHAGOGASTRODUODENOSCOPY N/A 10/21/2021   Procedure: ESOPHAGOGASTRODUODENOSCOPY (EGD);  Surgeon: Marnee Sink, MD;  Location: [facility];  Service: Endoscopy;  Laterality: N/A;   FULGURATION OF BLADDER TUMOR N/A 06/28/2018   Procedure: CYSTOSCOPY BLADDER BIOPSY, FULGERATION OF BLADDER;  Surgeon: Lawerence Pressman, MD;  Location: [facility];  Service: Urology;  Laterality: N/A;   JOINT REPLACEMENT Left    KNEE SURGERY Left    LAPAROSCOPIC NEPHRECTOMY, HAND ASSISTED Right 07/22/2018   Procedure: HAND ASSISTED LAPAROSCOPIC NEPHRECTOMY;  Surgeon: Lawerence Pressman, MD;  Location: [facility];  Service: Urology;  Laterality: Right;   NASAL SINUS SURGERY  07/2013   POLYPECTOMY N/A 02/23/2017   Procedure: POLYPECTOMY;  Surgeon: Marnee Sink, MD;  Location: [facility];  Service: Endoscopy;  Laterality: N/A;   POLYPECTOMY  10/21/2021   Procedure: POLYPECTOMY;  Surgeon: Marnee Sink, MD;  Location: [facility];  Service: Endoscopy;;   REPLACEMENT TOTAL KNEE Right    tooth abstraction     TRANSURETHRAL RESECTION OF BLADDER TUMOR N/A 05/12/2019   Procedure: TRANSURETHRAL RESECTION OF BLADDER TUMOR (TURBT);  Surgeon: Lawerence Pressman, MD;  Location: [facility];  Service: Urology;  Laterality: N/A;   VARICOCELE EXCISION     VIDEO BRONCHOSCOPY WITH ENDOBRONCHIAL ULTRASOUND N/A 05/03/2023   Procedure: VIDEO BRONCHOSCOPY WITH ENDOBRONCHIAL ULTRASOUND;  Surgeon: Annitta Kindler, MD;  Location: [facility];  Service: Pulmonary;  Laterality: N/A;   XI ROBOTIC ASSISTED VENTRAL HERNIA N/A 11/18/2020   Procedure: XI ROBOTIC ASSISTED VENTRAL HERNIA, incisional;  Surgeon: Alben Alma, MD;  Location: [facility];  Service: General;  Laterality: N/A;    SOCIAL  HISTORY: Social History   Socioeconomic History   Marital status: Married    Spouse name: Lonzell Robin   Number of children: Not on file   Years of education: Not on file   Highest education level: Not on file  Occupational History   Not on file  Tobacco Use   Smoking status: Former    Current packs/day: 0.00    Types: Cigarettes    Quit date: 06/15/2013    Years since quitting: 10.3   Smokeless tobacco: Never  Vaping Use   Vaping status: Never Used  Substance and Sexual Activity   Alcohol use: No    Alcohol/week: 0.0 standard drinks of alcohol   Drug use: Yes    Types: Marijuana   Sexual activity: Yes  Other Topics Concern   Not on file  Social History Narrative   Lives with wife, sister in law   Social Drivers of Health   Financial Resource Strain: Low Risk  (09/13/2021)   Received from [facility], [facility]   Overall Financial  Resource Strain (CARDIA)    Difficulty of Paying Living Expenses: Not hard at all  Food Insecurity: No Food Insecurity (09/13/2021)   Received from [facility], [facility]   Hunger Vital Sign    Worried About Running Out of Food in the Last Year: Never true    Ran Out of Food in the Last Year: Never true  Transportation Needs: No Transportation Needs (09/13/2021)   Received from [facility], [facility]   [facility] - Transportation    Lack of Transportation (Medical): No    Lack of Transportation (Non-Medical): No  Physical Activity: Not on file  Stress: Not on file  Social Connections: Not on file  Intimate Partner Violence: Not on file    FAMILY HISTORY: Family History  Problem Relation Age of Onset   Heart disease Father    Hypertension Father    COPD Father    Colon cancer Maternal Grandmother    Heart attack Paternal Grandfather     ALLERGIES:  is allergic to axitinib , propofol , and glucotrol [glipizide].  MEDICATIONS:  Current Outpatient Medications  Medication Sig Dispense Refill   albuterol  (VENTOLIN  HFA)  108 (90 Base) MCG/ACT inhaler USE 2 INHALATIONS ORALLY   EVERY 6 HOURS AS NEEDED FORWHEEZING OR SHORTNESS OF   BREATH 1 each 3   CABOMETYX  40 MG tablet TAKE 1 TABLET BY MOUTH 1 TIME A DAY. TAKE ON AN EMPTY STOMACH, 1 HOUR BEFORE OR 2 HOURS AFTER MEALS. 30 tablet 3   clobetasol  ointment (TEMOVATE ) 0.05 % Apply 1 Application topically 2 (two) times daily as needed. 30 g 0   fluticasone  (FLONASE ) 50 MCG/ACT nasal spray Place 2 sprays into both nostrils daily. 16 g 1   Fluticasone -Umeclidin-Vilant (TRELEGY ELLIPTA ) 100-62.5-25 MCG/ACT AEPB Inhale 1 puff into the lungs daily. 3 each 3   lisinopril  (ZESTRIL ) 20 MG tablet Take 1 tablet (20 mg total) by mouth daily. 90 tablet 3   metFORMIN  (GLUCOPHAGE -XR) 500 MG 24 hr tablet Take 2 tablets (1,000 mg total) by mouth 2 (two) times daily with a meal. 180 tablet 3   omeprazole  (PRILOSEC) 20 MG capsule TAKE 1 CAPSULE DAILY AS    NEEDED FOR ACID REFLUX 90 capsule 0   ondansetron  (ZOFRAN ) 8 MG tablet Take 1 tablet (8 mg total) by mouth every 8 (eight) hours as needed for nausea or vomiting. 20 tablet 1   prochlorperazine  (COMPAZINE ) 10 MG tablet Take 1 tablet (10 mg total) by mouth every 6 (six) hours as needed. 30 tablet 1   rosuvastatin  (CRESTOR ) 20 MG tablet Take 1 tablet (20 mg total) by mouth daily with supper. 90 tablet 1   Semaglutide , 1 MG/DOSE, 4 MG/3ML SOPN Inject 1 mg as directed once a week. 3 mL 2   ALPRAZolam  (XANAX ) 0.5 MG tablet Take 1 tablet (0.5 mg total) by mouth 2 (two) times daily as needed for anxiety or sleep. 60 tablet 0   empagliflozin  (JARDIANCE ) 25 MG TABS tablet Take 1 tablet (25 mg total) by mouth daily before breakfast. 90 tablet 1   No current facility-administered medications for this visit.     PHYSICAL EXAMINATION: ECOG PERFORMANCE STATUS: 1 - Symptomatic but completely ambulatory Vitals:   10/15/23 1320 10/15/23 1329  BP: (!) 142/97 (!) 136/94  Pulse: 67   Resp: 18   Temp: (!) 96 F (35.6 C)   SpO2: 98%    Filed  Weights   10/15/23 1320  Weight: 192 lb (87.1 kg)  Physical Exam Constitutional:      General: He is not in acute distress. Eyes:     General: No scleral icterus. Cardiovascular:     Rate and Rhythm: Normal rate and regular rhythm.  Pulmonary:     Effort: Pulmonary effort is normal. No respiratory distress.     Breath sounds: No wheezing.  Abdominal:     General: Bowel sounds are normal.     Palpations: Abdomen is soft.  Musculoskeletal:        General: No deformity. Normal range of motion.     Cervical back: Normal range of motion and neck supple.     Comments: Bilateral knee replacement.   Skin:    General: Skin is warm and dry.     Findings: No erythema or rash.  Neurological:     Mental Status: He is alert and oriented to person, place, and time. Mental status is at baseline.  Psychiatric:        Mood and Affect: Mood normal.     LABORATORY DATA:  I have reviewed the data as listed    Latest Ref Rng & Units 10/15/2023   12:42 PM 09/10/2023    1:17 PM 08/10/2023    8:16 AM  CBC  WBC 4.0 - 10.5 K/uL 6.5  7.0  6.7   Hemoglobin 13.0 - 17.0 g/dL 47.8  29.5  62.1   Hematocrit 39.0 - 52.0 % 48.1  46.1  46.2   Platelets 150 - 400 K/uL 189  207  218       Latest Ref Rng & Units 10/15/2023   12:42 PM 09/10/2023    1:17 PM 08/10/2023    8:16 AM  CMP  Glucose 70 - 99 mg/dL 308  92  657   BUN 6 - 20 mg/dL 17  15  20    Creatinine 0.61 - 1.24 mg/dL 8.46  9.62  9.52   Sodium 135 - 145 mmol/L 134  135  136   Potassium 3.5 - 5.1 mmol/L 4.2  3.9  4.2   Chloride 98 - 111 mmol/L 102  105  104   CO2 22 - 32 mmol/L 23  22  23    Calcium  8.9 - 10.3 mg/dL 8.7  8.9  9.3   Total Protein 6.5 - 8.1 g/dL 6.6  6.3  6.7   Total Bilirubin 0.0 - 1.2 mg/dL 0.8  0.6  1.0   Alkaline Phos 38 - 126 U/L 79  71  77   AST 15 - 41 U/L 32  37  30   ALT 0 - 44 U/L 38  33  30     RADIOGRAPHIC STUDIES: I have personally reviewed the radiological images as listed and agreed with the findings in  the report.  CT CHEST ABDOMEN PELVIS WO CONTRAST Result Date: 09/02/2023 CLINICAL DATA:  History of metastatic renal cell carcinoma, follow-up. * Tracking Code: BO * EXAM: CT CHEST, ABDOMEN AND PELVIS WITHOUT CONTRAST TECHNIQUE: Multidetector CT imaging of the chest, abdomen and pelvis was performed following the standard protocol without IV contrast. RADIATION DOSE REDUCTION: This exam was performed according to the departmental dose-optimization program which includes automated exposure control, adjustment of the mA and/or kV according to patient size and/or use of iterative reconstruction technique. COMPARISON:  Multiple priors including most recent CT May 24, 2023 FINDINGS: CT CHEST FINDINGS Cardiovascular: Aortic atherosclerosis. Normal size heart. No significant pericardial effusion/thickening. Coronary artery calcifications. Mediastinum/Nodes: Decreased size of the mediastinal lymph nodes. For reference: -low right paratracheal/precarinal  lymph node measures 10 mm in short axis on image 29/2 previously 14 mm. -subcarinal lymph node measures 10 mm in short axis on image 29/2 previously 16 mm. No suspicious thyroid  nodule. Patulous esophagus with mild symmetric distal esophageal wall thickening. Lungs/Pleura: Interval resolution of the clustered nodularity interposed with interstitial prominence and ground-glass in the right upper lobe. Scattered atelectasis/scarring. Stable scattered pulmonary nodules. No new suspicious pulmonary nodules or masses. For reference: -nodule in the right lung apex measures 3 mm on image 29/4, unchanged. -left upper lobe pulmonary nodule measures 1-2 mm on image 47/4 previously 3 mm. -part solid nodule in the anteromedial left upper lobe is no longer confidently identified previously measuring 5 mm. Musculoskeletal: No aggressive lytic or blastic lesion of bone. CT ABDOMEN PELVIS FINDINGS Hepatobiliary: No suspicious hepatic lesion on noncontrast enhanced examination.  Gallbladder is unremarkable. No biliary ductal dilation. Pancreas: No pancreatic ductal dilation or evidence of acute inflammation. Spleen: No splenomegaly or focal splenic lesion. Adrenals/Urinary Tract: No suspicious adrenal nodule/mass. Right kidney surgically absent. Stable soft tissue nodularity along the posterior aspect of the surgical bed measuring 2.4 x 1.0 cm on image 66/2 previously 2.5 x 0.9 cm. This is stable dating back to Sep 29, 2020. New suspicious nodularity in the nephrectomy bed. No left hydronephrosis. Stable 15 mm hyperdense left renal lesion on image 69/2 which measures Hounsfield units of 58 on today's examination but was previously characterized as a hemorrhagic/proteinaceous renal cyst. Urinary bladder is unremarkable for degree of distension. Stomach/Bowel: Stomach is unremarkable for degree of distension. No pathologic dilation of small or large bowel. Normal appendix. No evidence of acute bowel inflammation. Vascular/Lymphatic: Aortic atherosclerosis. Smooth IVC contours. No pathologically enlarged abdominal or pelvic lymph nodes. Reproductive: Prostate is unremarkable. Other: No significant abdominopelvic free fluid. Musculoskeletal: No aggressive lytic or blastic lesion of bone. Mild multilevel degenerative changes spine. IMPRESSION: 1. Interval resolution of the clustered nodularity interposed with interstitial prominence and ground-glass in the right upper lobe, likely infectious/inflammatory. 2. Stable scattered pulmonary nodules. No new suspicious pulmonary nodules or masses. 3. Decreased size of the mediastinal lymph nodes. 4. Stable soft tissue nodularity along the posterior aspect of the right nephrectomy bed dating back to Sep 29, 2020. 5. No evidence of new or progressive disease in the chest, abdomen or pelvis. 6. Stable 15 mm hyperdense left renal lesion previously characterized as a hemorrhagic/proteinaceous cyst, but does not meet this criteria on today's noncontrast  enhanced single-phase examination. Suggest continued attention on follow-up imaging. 7. Patulous esophagus with mild symmetric distal esophageal wall thickening, which may reflect esophagitis. 8.  Aortic Atherosclerosis (ICD10-I70.0). Electronically Signed   By: Tama Fails M.D.   On: 09/02/2023 11:06

## 2023-10-22 ENCOUNTER — Telehealth: Payer: Self-pay

## 2023-10-22 NOTE — Telephone Encounter (Signed)
 [employer] paperwork received requesting an extension of existing leave.  Not sure if this request is for the last FMLA completed for patient's wife.

## 2023-10-31 ENCOUNTER — Encounter: Payer: Self-pay | Admitting: Oncology

## 2023-11-01 NOTE — Telephone Encounter (Signed)
 Discussed with Dr. Wilhelmenia Harada and Pattie Borders.

## 2023-11-01 NOTE — Telephone Encounter (Signed)
 FMLA complete, pending Dr. Al[place]an [place].

## 2023-11-26 ENCOUNTER — Encounter: Payer: Self-pay | Admitting: Oncology

## 2023-11-26 ENCOUNTER — Inpatient Hospital Stay: Attending: Oncology

## 2023-11-26 ENCOUNTER — Inpatient Hospital Stay (HOSPITAL_BASED_OUTPATIENT_CLINIC_OR_DEPARTMENT_OTHER): Admitting: Oncology

## 2023-11-26 VITALS — BP 139/88 | HR 75 | Temp 98.9°F | Resp 20 | Wt 194.9 lb

## 2023-11-26 DIAGNOSIS — L271 Localized skin eruption due to drugs and medicaments taken internally: Secondary | ICD-10-CM

## 2023-11-26 DIAGNOSIS — I2510 Atherosclerotic heart disease of native coronary artery without angina pectoris: Secondary | ICD-10-CM | POA: Diagnosis not present

## 2023-11-26 DIAGNOSIS — I10 Essential (primary) hypertension: Secondary | ICD-10-CM | POA: Diagnosis not present

## 2023-11-26 DIAGNOSIS — C641 Malignant neoplasm of right kidney, except renal pelvis: Secondary | ICD-10-CM | POA: Insufficient documentation

## 2023-11-26 DIAGNOSIS — K521 Toxic gastroenteritis and colitis: Secondary | ICD-10-CM

## 2023-11-26 DIAGNOSIS — Z5111 Encounter for antineoplastic chemotherapy: Secondary | ICD-10-CM

## 2023-11-26 DIAGNOSIS — Z7985 Long-term (current) use of injectable non-insulin antidiabetic drugs: Secondary | ICD-10-CM | POA: Insufficient documentation

## 2023-11-26 DIAGNOSIS — Z7984 Long term (current) use of oral hypoglycemic drugs: Secondary | ICD-10-CM | POA: Insufficient documentation

## 2023-11-26 DIAGNOSIS — Z87891 Personal history of nicotine dependence: Secondary | ICD-10-CM | POA: Insufficient documentation

## 2023-11-26 DIAGNOSIS — E119 Type 2 diabetes mellitus without complications: Secondary | ICD-10-CM | POA: Insufficient documentation

## 2023-11-26 DIAGNOSIS — T451X5A Adverse effect of antineoplastic and immunosuppressive drugs, initial encounter: Secondary | ICD-10-CM

## 2023-11-26 DIAGNOSIS — E785 Hyperlipidemia, unspecified: Secondary | ICD-10-CM | POA: Insufficient documentation

## 2023-11-26 DIAGNOSIS — Z79899 Other long term (current) drug therapy: Secondary | ICD-10-CM | POA: Diagnosis not present

## 2023-11-26 DIAGNOSIS — C771 Secondary and unspecified malignant neoplasm of intrathoracic lymph nodes: Secondary | ICD-10-CM | POA: Diagnosis present

## 2023-11-26 LAB — CMP (CANCER CENTER ONLY)
ALT: 29 U/L (ref 0–44)
AST: 30 U/L (ref 15–41)
Albumin: 3.7 g/dL (ref 3.5–5.0)
Alkaline Phosphatase: 79 U/L (ref 38–126)
Anion gap: 7 (ref 5–15)
BUN: 21 mg/dL — ABNORMAL HIGH (ref 6–20)
CO2: 25 mmol/L (ref 22–32)
Calcium: 8.5 mg/dL — ABNORMAL LOW (ref 8.9–10.3)
Chloride: 105 mmol/L (ref 98–111)
Creatinine: 1.05 mg/dL (ref 0.61–1.24)
GFR, Estimated: >60 mL/min (ref >60)
Glucose, Bld: 100 mg/dL — ABNORMAL HIGH (ref 70–99)
Potassium: 4.1 mmol/L (ref 3.5–5.1)
Sodium: 137 mmol/L (ref 135–145)
Total Bilirubin: 0.8 mg/dL (ref 0.0–1.2)
Total Protein: 6.2 g/dL — ABNORMAL LOW (ref 6.5–8.1)

## 2023-11-26 LAB — CBC WITH DIFFERENTIAL (CANCER CENTER ONLY)
Abs Immature Granulocytes: 0.02 K/uL (ref 0.00–0.07)
Basophils Absolute: 0 K/uL (ref 0.0–0.1)
Basophils Relative: 0 %
Eosinophils Absolute: 0.2 K/uL (ref 0.0–0.5)
Eosinophils Relative: 3 %
HCT: 43 % (ref 39.0–52.0)
Hemoglobin: 14.5 g/dL (ref 13.0–17.0)
Immature Granulocytes: 0 %
Lymphocytes Relative: 44 %
Lymphs Abs: 3.1 K/uL (ref 0.7–4.0)
MCH: 32.7 pg (ref 26.0–34.0)
MCHC: 33.7 g/dL (ref 30.0–36.0)
MCV: 97.1 fL (ref 80.0–100.0)
Monocytes Absolute: 0.5 K/uL (ref 0.1–1.0)
Monocytes Relative: 7 %
Neutro Abs: 3.3 K/uL (ref 1.7–7.7)
Neutrophils Relative %: 46 %
Platelet Count: 186 K/uL (ref 150–400)
RBC: 4.43 MIL/uL (ref 4.22–5.81)
RDW: 13.2 % (ref 11.5–15.5)
WBC Count: 7.1 K/uL (ref 4.0–10.5)
nRBC: 0 % (ref 0.0–0.2)

## 2023-11-26 LAB — LACTATE DEHYDROGENASE: LDH: 170 U/L (ref 98–192)

## 2023-11-26 NOTE — Assessment & Plan Note (Signed)
 Stable symptoms  Recommend imodium  PRN as directed.

## 2023-11-26 NOTE — Assessment & Plan Note (Signed)
 Recommend calcium and vitamin D supplementation.

## 2023-11-26 NOTE — Progress Notes (Signed)
 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  Recent CT showed thoracic lymphadenopathy which were biopsy proven recurrent metastatic RCC 2nd line treatment with cabozantinib  60mg  daily--> did not tolerate, after 3 weeks chemo break  switched to 40mg  daily on 07/29/2023 --> chemo break from 10/15/23-11/05/23 due to side effects. Labs are reviewed and discussed with patient. He is back on cabozantinib  40mg  daily for 3 weeks and so far tolerates well. Continue current regimen.  Plan to see him back in 6 weeks. Repeat CT chest abdomen pelvis without contrast for evaluation of treatment response.    Chemotherapy induced diarrhea Stable symptoms  Recommend imodium  PRN as directed.   Encounter for antineoplastic chemotherapy Treatment plan as listed above.   Palmar plantar erythrodysesthesia Mild symptoms.  Continue emollient creams or lotions containing urea topically Recommend trials of Voltaren  topical twice daily  Hypocalcemia Recommend calcium  and vitamin D  supplementation.   Orders Placed This Encounter  Procedures   CT CHEST ABDOMEN PELVIS WO CONTRAST    Standing Status:   Future    Expected Date:   12/18/2023    Expiration Date:   11/25/2024    Preferred imaging location?:   [facility]    If indicated for the ordered procedure, I authorize the administration of oral contrast media per Radiology protocol:   Yes    Does the patient have a contrast media/X-ray dye allergy?:   No   CMP (Cancer Center only)    Standing Status:   Future    Expected Date:   01/07/2024    Expiration Date:   04/06/2024   CBC with Differential (Cancer Center Only)    Standing Status:   Future    Expected Date:    01/07/2024    Expiration Date:   04/06/2024   Lactate dehydrogenase    Standing Status:   Future    Expected Date:   01/07/2024    Expiration Date:   04/06/2024   Follow up: 6 weeks.  All questions were answered. The patient knows to call the clinic with any problems, questions or concerns.  Zelphia Cap, MD, PhD [facility] Hematology Oncology 11/26/2023     HISTORY OF PRESENTING ILLNESS:   Matthew Woodard is a  58 y.o.  male presents for follow up for metastatic kidney cancer Oncology History  Metastatic renal cell carcinoma (HCC)  06/2018 Imaging   CT urogram done which showed right 4 cm central enhancing renal mass with invasion into the collecting system. X-ray of chest was performed to see complete staging which showed no concerning findings   06/28/2018 Cancer Diagnosis      06/28/2018 Procedure    patient underwent a cystoscopy, bladder biopsy and a right retrograde pyelogram with intraoperative interpretation, right diagnostic ureteroscopy, right renal pelvis biopsy, right ureteral stent placement.  biopsy showed atypical cell clusters, with extensive crush artifact, nondiagnostic.     07/22/2018 Initial Diagnosis   Patient underwent right radical laparoscopic nephrectomy with biopsy showing 5.3 cm RCC, clear cell type, grade 3, tumor invades renal vein and segmental branches, pelvic calyceal system and perirenal sinus/fat.  Surgical margins negative for tumor.pT3a,Nx Post-operation, patient was on surveillance after surgery.   10/28/2018 Imaging   CT abdomen pelvis with contrast showed minimal fluid and or postoperative  changes within the right renal fossa.  No evidence of metastatic disease or recurrence.  Patient has left kidney lesion previously characterized as nonenhancing cyst by multi phasic contrast-enhanced CT.   04/30/2019 Progression   Patient reports an episode of gross hematuria in early December 2020, patient is due for 3-month surveillance CT scan. 04/30/2019 CT  abdomen pelvis with contrast showed high attenuation mass at the right uretero vesicle junction, with extension into the bladder.  Bilateral pulmonary nodules, most indicated for metastatic disease.  Patient underwent cystoscopy and transurethral resection of irregular bladder tumor 3 cm.  Mass appeared to emanate from the right ureteral orifice.  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  #NGS: [facility] PD L1 TPS 1%   05/28/2019 Imaging   CT chest w contrast 1. Numerous bilateral pulmonary nodules measuring up to 10 mm diameter. Imaging features compatible with metastatic disease. 2. Left hilar lymphadenopathy, also highly suspicious for metastatic involvement.    05/28/2019 Imaging   Bone scan showed  No scintigraphic evidence of osseous metastatic disease.    06/05/2019 - 06/21/2021 Chemotherapy   06/05/19 Axitinib  5mg  BID+ Keytruda   09/2019 off axitinib  and Keytruda  since the beginning of May due to transaminitis. GI work up negative.  Ultrasound liver vascular Doppler is negative #11/24/2019, resumed on Keytruda  every 3 weeks #01/05/2020, axitinib  was resumed 3 mg #01/20/2020, axitinib  was discontinued due to recurrence of transaminitis.  Keytruda  was temporarily held. #03/16/2020-06/21/2021 resumed on Keytruda  every 3 weeks Keytruda  was discontinued due to recurrent immunotherapy induced diarrhea    07/25/2019 Imaging   Brain w wo contrast No mass, hemorrhage, or abnormal enhancement.   09/29/2020 Imaging   CT scan showed no evidence of local recurrence or new/progressive disease in the chest abdomen or pelvis.  Tiny nonspecific lung nodules are unchanged.  No new lung nodules   01/14/2021 Imaging   CT chest abdomen pelvis showed stable exam, no new or progressive metastatic disease within chest abdomen pelvis. Stable lung nodules. Aortic atherosclerosis.    04/15/2021 Imaging   CT chest abdomen pelvis with contrast showed stable examination.   No evidence of recurrent or metastatic carcinoma within the chest abdomen pelvis.     08/02/2021 Imaging   CT chest abdomen pelvis without contrast, stable examination without evidence of recurrence or metastatic disease.  Mild asymmetric diffuse esophageal wall thickening, similar to prior examination.  Likely chronic esophagitis.  Aortic atherosclerosis   10/21/2021 Procedure   colonoscopy showed 3 mm polyp which was resected and retrieved.  Negative for dysplasia/malignancy.  Random biopsy showed lymphocytic colitis. EGD showed long segment Barrett's esophagus.  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  Negative for dysplasia and malignancy.  Small bowel cold biopsy showed reactive duodenitis.  Negative for CMV.  Negative for dysplasia or malignancy.   11/16/2021 Imaging   CT chest abdomen pelvis without contrast showed stable examination without evidence of local recurrence or active malignant disease in the chest abdomen pelvis.  Reflux retained contrast in the esophagus suggestive of esophagitis.  Aortic atherosclerosis   07/03/2022 Imaging   CT chest abdomen pelvis w contrast   1. Stable examination status post right nephrectomy without evidence of new or progressive disease in the chest, abdomen or pelvis. 2. Stable scattered tiny pulmonary nodules favored benign etiology. 3.  Aortic Atherosclerosis    01/04/2023 Imaging   CT chest abdomen pelvis wo contrast showed  1. New clustered nodularity and ground-glass in the peripheral right lower lobe, likely  infectious/inflammatory. Additionally there are new enlarged mediastinal lymph nodes which given the above findings are favored reactive but nonspecific. Suggest attention on short-term interval follow-up dedicated chest CT to ensure resolution. 2. Stable scattered tiny pulmonary nodules. 3. No evidence of local recurrence or metastatic disease in the abdomen or pelvis. 4.  Aortic  Atherosclerosis   04/09/2023 Imaging   CT chest wo contrast  1. Persistent right paratracheal and subcarinal adenopathy. These are stable to slightly increased in the interval (on the order of 1-2 mm). 2. Previously noted left upper lobe and right upper lobe lung nodules are not significantly changed in the interval. 3. New part solid nodule within the anterior left upper lobe measures 6 mm. The appearance is nonspecific. This may be postinflammatory or infectious in etiology. Consider short-term interval follow-up with repeat CT of the chest in 3 months to ensure resolution. 4. Stable appearance of focal area of ground-glass attenuation and interstitial prominence within the periphery of the right upper lobe. Favor sequelae of inflammation or infection 5. Coronary artery calcifications. 6.  Aortic Atherosclerosis    05/03/2023 Relapse/Recurrence   Biopsy via bronchoscopy  A. LYMPH NODE, 4R, FINE NEEDLE ASPIRATION:  - Carcinoma, consistent with renal cell carcinoma  - No definitive lymphoid tissue identified   B. LYMPH NODE, 7, FINE NEEDLE ASPIRATION:  - Carcinoma, consistent with renal cell carcinoma  - Lymphoid tissue identified   C. LYMPH NODE, 11R, FINE NEEDLE ASPIRATION:  - Carcinoma, consistent with renal cell carcinoma  - Lymphoid tissue identified    05/30/2023 - 07/09/2023 Chemotherapy   Cabozantinib  60mg  daily, stopped due to mucositis, palmar plantar erythrodysesthesia   07/29/2023 -  Chemotherapy   Cabozantinib  40mg  daily   Clear cell carcinoma of right kidney (HCC)     #History of cutaneous psoriasis, mostly on his right hand,    INTERVAL HISTORY Matthew Woodard is a 58 y.o. male who has above history reviewed by me today presents for follow up visit for management of metastatic RCC. Patient reports doing well. + Chronic cough, stable He took chemotherapy break for 3 weeks and has resumed cabozantinib  40mg  daily  for 3 weeks.  So far tolerates well.  No  diarrhea, severe fatigue.   Review of Systems  Constitutional:  Positive for fatigue. Negative for appetite change, chills, fever and unexpected weight change.  HENT:   Negative for hearing loss and voice change.   Eyes:  Negative for eye problems and icterus.  Respiratory:  Negative for chest tightness, cough and shortness of breath.   Cardiovascular:  Negative for chest pain and leg swelling.  Gastrointestinal:  Negative for abdominal distention, abdominal pain, blood in stool, diarrhea and nausea.  Endocrine: Negative for hot flashes.  Genitourinary:  Negative for difficulty urinating, dysuria and frequency.   Musculoskeletal:        Bilateral knee replacement.   Skin:  Negative for itching and rash.  Neurological:  Negative for extremity weakness, headaches, light-headedness and numbness.  Hematological:  Negative for adenopathy. Does not bruise/bleed easily.  Psychiatric/Behavioral:  Negative for confusion.     MEDICAL HISTORY:  Past Medical History:  Diagnosis Date   Arthritis    hands, left knee   Cancer (HCC)    COPD (chronic obstructive pulmonary disease) (HCC)    Diabetes mellitus without complication (HCC)    Eczema 01/08/2017   GERD (gastroesophageal reflux disease)    History of kidney cancer    Hyperlipidemia LDL goal <100 11/02/2014   Hypertension  Kidney stone    YEAR H/O HEMATURIA   Psoriasis    Wears dentures    full upper, partial lower.  Has, does not wear    SURGICAL HISTORY: Past Surgical History:  Procedure Laterality Date   BRONCHIAL NEEDLE ASPIRATION BIOPSY  05/03/2023   Procedure: BRONCHIAL NEEDLE ASPIRATION BIOPSIES;  Surgeon: Malka Domino, MD;  Location: [facility];  Service: Pulmonary;;   COLONOSCOPY N/A 10/21/2021   Procedure: COLONOSCOPY;  Surgeon: Jinny Carmine, MD;  Location: [facility];  Service: Endoscopy;  Laterality: N/A;   COLONOSCOPY WITH PROPOFOL  N/A 02/23/2017   Procedure: COLONOSCOPY WITH PROPOFOL ;  Surgeon:  Jinny Carmine, MD;  Location: [facility];  Service: Endoscopy;  Laterality: N/A;  Diabetic - oral meds   CYST EXCISION  12/2014   on back   CYSTOSCOPY W/ RETROGRADES Right 06/28/2018   Procedure: CYSTOSCOPY WITH RETROGRADE PYELOGRAM;  Surgeon: Francisca Redell BROCKS, MD;  Location: [facility] OR[facility];  Service: Urology;  Laterality: Right;   CYSTOSCOPY W/ URETERAL STENT REMOVAL Right 07/22/2018   Procedure: CYSTOSCOPY WITH STENT REMOVAL;  Surgeon: Francisca Redell BROCKS, MD;  Location: [facility];  Service: Urology;  Laterality: Right;   CYSTOSCOPY WITH BIOPSY Right 06/28/2018   Procedure: CYSTOSCOPY WITH RENAL BIOPSY;  Surgeon: Francisca Redell BROCKS, MD;  Location: [facility];  Service: Urology;  Laterality: Right;   CYSTOSCOPY WITH URETEROSCOPY AND STENT PLACEMENT Right 06/28/2018   Procedure: CYSTOSCOPY WITH URETEROSCOPY AND STENT PLACEMENT;  Surgeon: Francisca Redell BROCKS, MD;  Location: [facility];  Service: Urology;  Laterality: Right;   ESOPHAGOGASTRODUODENOSCOPY N/A 10/21/2021   Procedure: ESOPHAGOGASTRODUODENOSCOPY (EGD);  Surgeon: Jinny Carmine, MD;  Location: [facility];  Service: Endoscopy;  Laterality: N/A;   FULGURATION OF BLADDER TUMOR N/A 06/28/2018   Procedure: CYSTOSCOPY BLADDER BIOPSY, FULGERATION OF BLADDER;  Surgeon: Francisca Redell BROCKS, MD;  Location: [facility];  Service: Urology;  Laterality: N/A;   JOINT REPLACEMENT Left    KNEE SURGERY Left    LAPAROSCOPIC NEPHRECTOMY, HAND ASSISTED Right 07/22/2018   Procedure: HAND ASSISTED LAPAROSCOPIC NEPHRECTOMY;  Surgeon: Francisca Redell BROCKS, MD;  Location: [facility];  Service: Urology;  Laterality: Right;   NASAL SINUS SURGERY  07/2013   POLYPECTOMY N/A 02/23/2017   Procedure: POLYPECTOMY;  Surgeon: Jinny Carmine, MD;  Location: [facility];  Service: Endoscopy;  Laterality: N/A;   POLYPECTOMY  10/21/2021   Procedure: POLYPECTOMY;  Surgeon: Jinny Carmine, MD;  Location: [facility];  Service: Endoscopy;;   REPLACEMENT TOTAL KNEE Right     tooth abstraction     TRANSURETHRAL RESECTION OF BLADDER TUMOR N/A 05/12/2019   Procedure: TRANSURETHRAL RESECTION OF BLADDER TUMOR (TURBT);  Surgeon: Francisca Redell BROCKS, MD;  Location: [facility];  Service: Urology;  Laterality: N/A;   VARICOCELE EXCISION     VIDEO BRONCHOSCOPY WITH ENDOBRONCHIAL ULTRASOUND N/A 05/03/2023   Procedure: VIDEO BRONCHOSCOPY WITH ENDOBRONCHIAL ULTRASOUND;  Surgeon: Malka Domino, MD;  Location: [facility];  Service: Pulmonary;  Laterality: N/A;   XI ROBOTIC ASSISTED VENTRAL HERNIA N/A 11/18/2020   Procedure: XI ROBOTIC ASSISTED VENTRAL HERNIA, incisional;  Surgeon: Jordis Laneta FALCON, MD;  Location: [facility];  Service: General;  Laterality: N/A;    SOCIAL HISTORY: Social History   Socioeconomic History   Marital status: Married    Spouse name: Jinnie   Number of children: Not on file   Years of education: Not on file   Highest education level: Not on file  Occupational History   Not on file  Tobacco Use   Smoking  status: Former    Current packs/day: 0.00    Types: Cigarettes    Quit date: 06/15/2013    Years since quitting: 10.4   Smokeless tobacco: Never  Vaping Use   Vaping status: Never Used  Substance and Sexual Activity   Alcohol use: No    Alcohol/week: 0.0 standard drinks of alcohol   Drug use: Yes    Types: Marijuana   Sexual activity: Yes  Other Topics Concern   Not on file  Social History Narrative   Lives with wife, sister in law   Social Drivers of Health   Financial Resource Strain: Low Risk  (09/13/2021)   Received from [facility]   Overall Financial Resource Strain (CARDIA)    Difficulty of Paying Living Expenses: Not hard at all  Food Insecurity: No Food Insecurity (09/13/2021)   Received from [facility]   Hunger Vital Sign    Within the past 12 months, you worried that your food would run out before you got the money to buy more.: Never true    Within the past 12 months, the food you bought just didn't last and you  didn't have money to get more.: Never true  Transportation Needs: No Transportation Needs (09/13/2021)   Received from [facility]   PRAPARE - Transportation    Lack of Transportation (Medical): No    Lack of Transportation (Non-Medical): No  Physical Activity: Not on file  Stress: Not on file  Social Connections: Not on file  Intimate Partner Violence: Not on file    FAMILY HISTORY: Family History  Problem Relation Age of Onset   Heart disease Father    Hypertension Father    COPD Father    Colon cancer Maternal Grandmother    Heart attack Paternal Grandfather     ALLERGIES:  is allergic to axitinib , propofol , and glucotrol [glipizide].  MEDICATIONS:  Current Outpatient Medications  Medication Sig Dispense Refill   albuterol  (VENTOLIN  HFA) 108 (90 Base) MCG/ACT inhaler USE 2 INHALATIONS ORALLY   EVERY 6 HOURS AS NEEDED FORWHEEZING OR SHORTNESS OF   BREATH 1 each 3   ALPRAZolam  (XANAX ) 0.5 MG tablet Take 1 tablet (0.5 mg total) by mouth 2 (two) times daily as needed for anxiety or sleep. 60 tablet 0   CABOMETYX  40 MG tablet TAKE 1 TABLET BY MOUTH 1 TIME A DAY. TAKE ON AN EMPTY STOMACH, 1 HOUR BEFORE OR 2 HOURS AFTER MEALS. 30 tablet 3   clobetasol  ointment (TEMOVATE ) 0.05 % Apply 1 Application topically 2 (two) times daily as needed. 30 g 0   empagliflozin  (JARDIANCE ) 25 MG TABS tablet Take 1 tablet (25 mg total) by mouth daily before breakfast. 90 tablet 1   fluticasone  (FLONASE ) 50 MCG/ACT nasal spray Place 2 sprays into both nostrils daily. 16 g 1   Fluticasone -Umeclidin-Vilant (TRELEGY ELLIPTA ) 100-62.5-25 MCG/ACT AEPB Inhale 1 puff into the lungs daily. 3 each 3   lisinopril  (ZESTRIL ) 20 MG tablet Take 1 tablet (20 mg total) by mouth daily. 90 tablet 3   metFORMIN  (GLUCOPHAGE -XR) 500 MG 24 hr tablet Take 2 tablets (1,000 mg total) by mouth 2 (two) times daily with a meal. 180 tablet 3   omeprazole  (PRILOSEC) 20 MG capsule TAKE 1 CAPSULE DAILY AS    NEEDED FOR ACID REFLUX 90  capsule 0   ondansetron  (ZOFRAN ) 8 MG tablet Take 1 tablet (8 mg total) by mouth every 8 (eight) hours as needed for nausea or vomiting. 20 tablet 1  prochlorperazine  (COMPAZINE ) 10 MG tablet Take 1 tablet (10 mg total) by mouth every 6 (six) hours as needed. 30 tablet 1   rosuvastatin  (CRESTOR ) 20 MG tablet Take 1 tablet (20 mg total) by mouth daily with supper. 90 tablet 1   Semaglutide , 1 MG/DOSE, 4 MG/3ML SOPN Inject 1 mg as directed once a week. 3 mL 2   No current facility-administered medications for this visit.     PHYSICAL EXAMINATION: ECOG PERFORMANCE STATUS: 1 - Symptomatic but completely ambulatory Vitals:   11/26/23 1258  BP: 139/88  Pulse: 75  Resp: 20  Temp: 98.9 F (37.2 C)  SpO2: 100%   Filed Weights   11/26/23 1258  Weight: 194 lb 14.4 oz (88.4 kg)      Physical Exam Constitutional:      General: He is not in acute distress. Eyes:     General: No scleral icterus. Cardiovascular:     Rate and Rhythm: Normal rate and regular rhythm.  Pulmonary:     Effort: Pulmonary effort is normal. No respiratory distress.     Breath sounds: No wheezing.  Abdominal:     General: Bowel sounds are normal.     Palpations: Abdomen is soft.  Musculoskeletal:        General: No deformity. Normal range of motion.     Cervical back: Normal range of motion and neck supple.     Comments: Bilateral knee replacement.   Skin:    General: Skin is warm and dry.     Findings: No erythema or rash.  Neurological:     Mental Status: He is alert and oriented to person, place, and time. Mental status is at baseline.  Psychiatric:        Mood and Affect: Mood normal.     LABORATORY DATA:  I have reviewed the data as listed    Latest Ref Rng & Units 11/26/2023   12:42 PM 10/15/2023   12:42 PM 09/10/2023    1:17 PM  CBC  WBC 4.0 - 10.5 K/uL 7.1  6.5  7.0   Hemoglobin 13.0 - 17.0 g/dL 85.4  83.7  84.2   Hematocrit 39.0 - 52.0 % 43.0  48.1  46.1   Platelets 150 - 400 K/uL 186   189  207       Latest Ref Rng & Units 11/26/2023   12:42 PM 10/15/2023   12:42 PM 09/10/2023    1:17 PM  CMP  Glucose 70 - 99 mg/dL 899  892  92   BUN 6 - 20 mg/dL 21  17  15    Creatinine 0.61 - 1.24 mg/dL 8.94  8.99  9.10   Sodium 135 - 145 mmol/L 137  134  135   Potassium 3.5 - 5.1 mmol/L 4.1  4.2  3.9   Chloride 98 - 111 mmol/L 105  102  105   CO2 22 - 32 mmol/L 25  23  22    Calcium  8.9 - 10.3 mg/dL 8.5  8.7  8.9   Total Protein 6.5 - 8.1 g/dL 6.2  6.6  6.3   Total Bilirubin 0.0 - 1.2 mg/dL 0.8  0.8  0.6   Alkaline Phos 38 - 126 U/L 79  79  71   AST 15 - 41 U/L 30  32  37   ALT 0 - 44 U/L 29  38  33     RADIOGRAPHIC STUDIES: I have personally reviewed the radiological images as listed and agreed with the findings in the  report.  No results found.

## 2023-11-26 NOTE — Assessment & Plan Note (Addendum)
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  Recent CT showed thoracic lymphadenopathy which were biopsy proven recurrent metastatic RCC 2nd line treatment with cabozantinib  60mg  daily--> did not tolerate, after 3 weeks chemo break  switched to 40mg  daily on 07/29/2023 --> chemo break from 10/15/23-11/05/23 due to side effects. Labs are reviewed and discussed with patient. He is back on cabozantinib  40mg  daily for 3 weeks and so far tolerates well. Continue current regimen.  Plan to see him back in 6 weeks. Repeat CT chest abdomen pelvis without contrast for evaluation of treatment response.

## 2023-11-26 NOTE — Assessment & Plan Note (Signed)
 Treatment plan as listed above.

## 2023-11-26 NOTE — Assessment & Plan Note (Signed)
 Mild symptoms.  Continue emollient creams or lotions containing urea topically Recommend trials of Voltaren  topical twice daily

## 2023-12-12 ENCOUNTER — Other Ambulatory Visit: Payer: Self-pay | Admitting: Internal Medicine

## 2023-12-12 ENCOUNTER — Encounter: Payer: Self-pay | Admitting: Oncology

## 2023-12-12 DIAGNOSIS — K219 Gastro-esophageal reflux disease without esophagitis: Secondary | ICD-10-CM

## 2023-12-13 ENCOUNTER — Other Ambulatory Visit: Payer: Self-pay

## 2023-12-13 MED ORDER — ALPRAZOLAM 0.5 MG PO TABS: 0.5000 mg | ORAL_TABLET | Freq: Two times a day (BID) | ORAL | 0 refills | Status: DC | PRN

## 2023-12-13 NOTE — Telephone Encounter (Signed)
 Requested Prescriptions  Pending Prescriptions Disp Refills   omeprazole  (PRILOSEC) 20 MG capsule [Pharmacy Med Name: OMEPRAZOL RX CAP 20MG ] 90 capsule 0    Sig: TAKE 1 CAPSULE DAILY AS    NEEDED FOR ACID REFLUX     Gastroenterology: Proton Pump Inhibitors Passed - 12/13/2023  7:58 AM      Passed - Valid encounter within last 12 months    Recent Outpatient Visits           2 months ago Type 2 diabetes mellitus with hyperglycemia, without long-term current use of insulin  [facility])   [facility] Bernardo Fend, DO   5 months ago Type 2 diabetes mellitus with hyperglycemia, without long-term current use of insulin  [facility])   [facility] Bernardo Fend, DO       Future Appointments             In 4 months Bernardo Fend, DO [facility], [facility]

## 2023-12-17 ENCOUNTER — Ambulatory Visit
Admission: RE | Admit: 2023-12-17 | Discharge: 2023-12-17 | Disposition: A | Discharge status: Home / Self Care | Source: Ambulatory Visit | Attending: Oncology | Admitting: Oncology

## 2023-12-17 DIAGNOSIS — C641 Malignant neoplasm of right kidney, except renal pelvis: Secondary | ICD-10-CM | POA: Insufficient documentation

## 2023-12-28 ENCOUNTER — Encounter: Payer: Self-pay | Admitting: Oncology

## 2024-01-08 ENCOUNTER — Other Ambulatory Visit: Payer: Self-pay

## 2024-01-08 ENCOUNTER — Inpatient Hospital Stay (HOSPITAL_BASED_OUTPATIENT_CLINIC_OR_DEPARTMENT_OTHER): Admitting: Oncology

## 2024-01-08 ENCOUNTER — Inpatient Hospital Stay: Attending: Oncology

## 2024-01-08 ENCOUNTER — Encounter: Payer: Self-pay | Admitting: Oncology

## 2024-01-08 VITALS — BP 138/96 | HR 91 | Temp 97.3°F | Resp 16 | Wt 187.4 lb

## 2024-01-08 DIAGNOSIS — C7800 Secondary malignant neoplasm of unspecified lung: Secondary | ICD-10-CM | POA: Insufficient documentation

## 2024-01-08 DIAGNOSIS — C641 Malignant neoplasm of right kidney, except renal pelvis: Secondary | ICD-10-CM

## 2024-01-08 DIAGNOSIS — C771 Secondary and unspecified malignant neoplasm of intrathoracic lymph nodes: Secondary | ICD-10-CM | POA: Diagnosis present

## 2024-01-08 DIAGNOSIS — Z7984 Long term (current) use of oral hypoglycemic drugs: Secondary | ICD-10-CM | POA: Diagnosis not present

## 2024-01-08 DIAGNOSIS — L271 Localized skin eruption due to drugs and medicaments taken internally: Secondary | ICD-10-CM | POA: Diagnosis not present

## 2024-01-08 DIAGNOSIS — Z79899 Other long term (current) drug therapy: Secondary | ICD-10-CM | POA: Insufficient documentation

## 2024-01-08 DIAGNOSIS — Z87891 Personal history of nicotine dependence: Secondary | ICD-10-CM | POA: Diagnosis not present

## 2024-01-08 DIAGNOSIS — C7911 Secondary malignant neoplasm of bladder: Secondary | ICD-10-CM | POA: Diagnosis not present

## 2024-01-08 DIAGNOSIS — K521 Toxic gastroenteritis and colitis: Secondary | ICD-10-CM | POA: Diagnosis not present

## 2024-01-08 DIAGNOSIS — I10 Essential (primary) hypertension: Secondary | ICD-10-CM | POA: Diagnosis not present

## 2024-01-08 DIAGNOSIS — Z905 Acquired absence of kidney: Secondary | ICD-10-CM | POA: Diagnosis not present

## 2024-01-08 DIAGNOSIS — Z5111 Encounter for antineoplastic chemotherapy: Secondary | ICD-10-CM

## 2024-01-08 DIAGNOSIS — T451X5A Adverse effect of antineoplastic and immunosuppressive drugs, initial encounter: Secondary | ICD-10-CM

## 2024-01-08 DIAGNOSIS — Z7985 Long-term (current) use of injectable non-insulin antidiabetic drugs: Secondary | ICD-10-CM | POA: Insufficient documentation

## 2024-01-08 DIAGNOSIS — Z800 Family history of malignant neoplasm of digestive organs: Secondary | ICD-10-CM | POA: Diagnosis not present

## 2024-01-08 LAB — CBC WITH DIFFERENTIAL (CANCER CENTER ONLY)
Abs Immature Granulocytes: 0.01 K/uL (ref 0.00–0.07)
Basophils Absolute: 0 K/uL (ref 0.0–0.1)
Basophils Relative: 0 %
Eosinophils Absolute: 0.2 K/uL (ref 0.0–0.5)
Eosinophils Relative: 3 %
HCT: 41.8 % (ref 39.0–52.0)
Hemoglobin: 14.3 g/dL (ref 13.0–17.0)
Immature Granulocytes: 0 %
Lymphocytes Relative: 33 %
Lymphs Abs: 1.8 K/uL (ref 0.7–4.0)
MCH: 33 pg (ref 26.0–34.0)
MCHC: 34.2 g/dL (ref 30.0–36.0)
MCV: 96.5 fL (ref 80.0–100.0)
Monocytes Absolute: 0.5 K/uL (ref 0.1–1.0)
Monocytes Relative: 9 %
Neutro Abs: 3 K/uL (ref 1.7–7.7)
Neutrophils Relative %: 55 %
Platelet Count: 190 K/uL (ref 150–400)
RBC: 4.33 MIL/uL (ref 4.22–5.81)
RDW: 13.3 % (ref 11.5–15.5)
WBC Count: 5.5 K/uL (ref 4.0–10.5)
nRBC: 0 % (ref 0.0–0.2)

## 2024-01-08 LAB — CMP (CANCER CENTER ONLY)
ALT: 22 U/L (ref 0–44)
AST: 20 U/L (ref 15–41)
Albumin: 3.5 g/dL (ref 3.5–5.0)
Alkaline Phosphatase: 63 U/L (ref 38–126)
Anion gap: 6 (ref 5–15)
BUN: 15 mg/dL (ref 6–20)
CO2: 26 mmol/L (ref 22–32)
Calcium: 9.1 mg/dL (ref 8.9–10.3)
Chloride: 101 mmol/L (ref 98–111)
Creatinine: 0.9 mg/dL (ref 0.61–1.24)
GFR, Estimated: >60 mL/min (ref >60)
Glucose, Bld: 99 mg/dL (ref 70–99)
Potassium: 4.6 mmol/L (ref 3.5–5.1)
Sodium: 133 mmol/L — ABNORMAL LOW (ref 135–145)
Total Bilirubin: 0.9 mg/dL (ref 0.0–1.2)
Total Protein: 6.2 g/dL — ABNORMAL LOW (ref 6.5–8.1)

## 2024-01-08 LAB — T4, FREE: Free T4: 0.78 ng/dL (ref 0.61–1.12)

## 2024-01-08 LAB — TSH: TSH: 2.43 u[IU]/mL (ref 0.350–4.500)

## 2024-01-08 LAB — LACTATE DEHYDROGENASE: LDH: 143 U/L (ref 98–192)

## 2024-01-08 MED ORDER — CABOMETYX 20 MG PO TABS: 20.0000 mg | ORAL_TABLET | Freq: Every day | ORAL | 2 refills | Status: DC

## 2024-01-08 NOTE — Assessment & Plan Note (Signed)
 Treatment plan as listed above.

## 2024-01-08 NOTE — Assessment & Plan Note (Addendum)
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  Recent CT showed thoracic lymphadenopathy which were biopsy proven recurrent metastatic RCC 2nd line treatment with cabozantinib  60mg  daily--> did not tolerate, after 3 weeks chemo break  switched to 40mg  daily on 07/29/2023 --> chemo break from 10/15/23-11/05/23 due to side effects. Labs are reviewed and discussed with patient. He is back on cabozantinib  40mg  daily until 12/28/2023 due to side effects. Repeat CT chest abdomen pelvis without contrast showed stable disease. Patient would like to have a chemotherapy break for his cruise trip in September.  I think that is reasonable given the well-controlled disease. Recommend patient to decrease cabozantinib  to 20 mg daily, he may start after he comes back from his trip.  I will plan to see patient 1 month after he starts on medication.

## 2024-01-08 NOTE — Assessment & Plan Note (Signed)
 Mild symptoms.  Continue emollient creams or lotions containing urea topically Recommend trials of Voltaren  topical twice daily

## 2024-01-08 NOTE — Progress Notes (Signed)
 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  Recent CT showed thoracic lymphadenopathy which were biopsy proven recurrent metastatic RCC 2nd line treatment with cabozantinib  60mg  daily--> did not tolerate, after 3 weeks chemo break  switched to 40mg  daily on 07/29/2023 --> chemo break from 10/15/23-11/05/23 due to side effects. Labs are reviewed and discussed with patient. He is back on cabozantinib  40mg  daily until 12/28/2023 due to side effects. Repeat CT chest abdomen pelvis without contrast showed stable disease. Patient would like to have a chemotherapy break for his cruise trip in September.  I think that is reasonable given the well-controlled disease. Recommend patient to decrease cabozantinib  to 20 mg daily, he may start after he comes back from his trip.  I will plan to see patient 1 month after he starts on medication.   Chemotherapy induced diarrhea Improved after stopping cabozantinib  Recommend imodium  PRN as directed.   Encounter for antineoplastic chemotherapy Treatment plan as listed above.   Hypocalcemia Recommend calcium  and vitamin D  supplementation.  Palmar plantar erythrodysesthesia Mild symptoms.  Continue emollient creams or lotions containing urea topically Recommend trials of Voltaren  topical twice daily   Orders Placed This Encounter  Procedures   CBC with Differential (Cancer Center Only)    Standing Status:   Future    Expected Date:   03/12/2024    Expiration Date:   06/10/2024   CMP (Cancer Center only)    Standing Status:   Future    Expected Date:   03/12/2024    Expiration Date:   06/10/2024   Lactate dehydrogenase    Standing Status:   Future    Expected Date:    03/12/2024    Expiration Date:   06/10/2024   TSH    Standing Status:   Future    Number of Occurrences:   1    Expected Date:   01/08/2024    Expiration Date:   04/07/2024   T4, free    Standing Status:   Future    Number of Occurrences:   1    Expected Date:   01/08/2024    Expiration Date:   04/07/2024   Follow up: end of Oct All questions were answered. The patient knows to call the clinic with any problems, questions or concerns.  Zelphia Cap, MD, PhD [facility] Hematology Oncology 01/08/2024     HISTORY OF PRESENTING ILLNESS:   Matthew Woodard is a  58 y.o.  male presents for follow up for metastatic kidney cancer Oncology History  Metastatic renal cell carcinoma (HCC)  06/2018 Imaging   CT urogram done which showed right 4 cm central enhancing renal mass with invasion into the collecting system. X-ray of chest was performed to see complete staging which showed no concerning findings   06/28/2018 Cancer Diagnosis      06/28/2018 Procedure    patient underwent a cystoscopy, bladder biopsy and a right retrograde pyelogram with intraoperative interpretation, right diagnostic ureteroscopy, right renal pelvis biopsy, right ureteral stent placement.  biopsy showed atypical cell clusters, with extensive crush artifact, nondiagnostic.     07/22/2018 Initial Diagnosis   Patient underwent right radical laparoscopic nephrectomy with biopsy showing 5.3 cm RCC, clear cell type, grade 3, tumor invades renal vein and segmental branches, pelvic calyceal  system and perirenal sinus/fat.  Surgical margins negative for tumor.pT3a,Nx Post-operation, patient was on surveillance after surgery.   10/28/2018 Imaging   CT abdomen pelvis with contrast showed minimal fluid and or postoperative changes within the right renal fossa.  No evidence of metastatic disease or recurrence.  Patient has left kidney lesion previously characterized as nonenhancing cyst by multi phasic contrast-enhanced CT.    04/30/2019 Progression   Patient reports an episode of gross hematuria in early December 2020, patient is due for 59-month surveillance CT scan. 04/30/2019 CT abdomen pelvis with contrast showed high attenuation mass at the right uretero vesicle junction, with extension into the bladder.  Bilateral pulmonary nodules, most indicated for metastatic disease.  Patient underwent cystoscopy and transurethral resection of irregular bladder tumor 3 cm.  Mass appeared to emanate from the right ureteral orifice.  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  #NGS: [employer] PD L1 TPS 1%   05/28/2019 Imaging   CT chest w contrast 1. Numerous bilateral pulmonary nodules measuring up to 10 mm diameter. Imaging features compatible with metastatic disease. 2. Left hilar lymphadenopathy, also highly suspicious for metastatic involvement.    05/28/2019 Imaging   Bone scan showed  No scintigraphic evidence of osseous metastatic disease.    06/05/2019 - 06/21/2021 Chemotherapy   06/05/19 Axitinib  5mg  BID+ Keytruda   09/2019 off axitinib  and Keytruda  since the beginning of May due to transaminitis. GI work up negative.  Ultrasound liver vascular Doppler is negative #11/24/2019, resumed on Keytruda  every 3 weeks #01/05/2020, axitinib  was resumed 3 mg #01/20/2020, axitinib  was discontinued due to recurrence of transaminitis.  Keytruda  was temporarily held. #03/16/2020-06/21/2021 resumed on Keytruda  every 3 weeks Keytruda  was discontinued due to recurrent immunotherapy induced diarrhea    07/25/2019 Imaging   Brain w wo contrast No mass, hemorrhage, or abnormal enhancement.   09/29/2020 Imaging   CT scan showed no evidence of local recurrence or new/progressive disease in the chest abdomen or pelvis.  Tiny nonspecific lung nodules are unchanged.  No new lung nodules   01/14/2021 Imaging   CT chest abdomen pelvis showed stable exam, no new or progressive metastatic disease  within chest abdomen pelvis. Stable lung nodules. Aortic atherosclerosis.    04/15/2021 Imaging   CT chest abdomen pelvis with contrast showed stable examination.  No evidence of recurrent or metastatic carcinoma within the chest abdomen pelvis.     08/02/2021 Imaging   CT chest abdomen pelvis without contrast, stable examination without evidence of recurrence or metastatic disease.  Mild asymmetric diffuse esophageal wall thickening, similar to prior examination.  Likely chronic esophagitis.  Aortic atherosclerosis   10/21/2021 Procedure   colonoscopy showed 3 mm polyp which was resected and retrieved.  Negative for dysplasia/malignancy.  Random biopsy showed lymphocytic colitis. EGD showed long segment Barrett's esophagus.  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  Negative for dysplasia and malignancy.  Small bowel cold biopsy showed reactive duodenitis.  Negative for CMV.  Negative for dysplasia or malignancy.   11/16/2021 Imaging   CT chest abdomen pelvis without contrast showed stable examination without evidence of local recurrence or active malignant disease in the chest abdomen pelvis.  Reflux retained contrast in the esophagus suggestive of esophagitis.  Aortic atherosclerosis   07/03/2022 Imaging   CT chest abdomen pelvis w contrast   1. Stable examination status post right nephrectomy without evidence of new or progressive disease in the chest, abdomen or pelvis. 2. Stable scattered tiny pulmonary nodules favored  benign etiology. 3.  Aortic Atherosclerosis    01/04/2023 Imaging   CT chest abdomen pelvis wo contrast showed  1. New clustered nodularity and ground-glass in the peripheral right lower lobe, likely infectious/inflammatory. Additionally there are new enlarged mediastinal lymph nodes which given the above findings are favored reactive but nonspecific. Suggest attention on short-term interval follow-up dedicated chest CT to  ensure resolution. 2. Stable scattered tiny pulmonary nodules. 3. No evidence of local recurrence or metastatic disease in the abdomen or pelvis. 4.  Aortic Atherosclerosis   04/09/2023 Imaging   CT chest wo contrast  1. Persistent right paratracheal and subcarinal adenopathy. These are stable to slightly increased in the interval (on the order of 1-2 mm). 2. Previously noted left upper lobe and right upper lobe lung nodules are not significantly changed in the interval. 3. New part solid nodule within the anterior left upper lobe measures 6 mm. The appearance is nonspecific. This may be postinflammatory or infectious in etiology. Consider short-term interval follow-up with repeat CT of the chest in 3 months to ensure resolution. 4. Stable appearance of focal area of ground-glass attenuation and interstitial prominence within the periphery of the right upper lobe. Favor sequelae of inflammation or infection 5. Coronary artery calcifications. 6.  Aortic Atherosclerosis    05/03/2023 Relapse/Recurrence   Biopsy via bronchoscopy  A. LYMPH NODE, 4R, FINE NEEDLE ASPIRATION:  - Carcinoma, consistent with renal cell carcinoma  - No definitive lymphoid tissue identified   B. LYMPH NODE, 7, FINE NEEDLE ASPIRATION:  - Carcinoma, consistent with renal cell carcinoma  - Lymphoid tissue identified   C. LYMPH NODE, 11R, FINE NEEDLE ASPIRATION:  - Carcinoma, consistent with renal cell carcinoma  - Lymphoid tissue identified    05/30/2023 - 07/09/2023 Chemotherapy   Cabozantinib  60mg  daily, stopped due to mucositis, palmar plantar erythrodysesthesia   07/29/2023 -  Chemotherapy   Cabozantinib  40mg  daily   12/17/2023 Imaging   CT chest abdomen pelvis  1. Small low and high attenuation lesions in the left kidney are difficult to further characterize due to size and lack of postcontrast imaging but appear stable from 08/24/2023. Recommend continued attention on follow-up. 2. Otherwise, no  evidence of recurrent or metastatic disease. 3. Age advanced left anterior descending coronary artery calcification. 4.  Aortic atherosclerosis (ICD10-I70.0)   Clear cell carcinoma of right kidney (HCC)     #History of cutaneous psoriasis, mostly on his right hand,    INTERVAL HISTORY Matthew Woodard is a 58 y.o. male who has above history reviewed by me today presents for follow up visit for management of metastatic RCC. Patient reports doing well. + Chronic cough, stable Patient was previously on 40mg  daily, 12/28/2023, patient called clinic to report severe diarrhea, ulcer in his nose and was recommended to stop the months.  He was on the cabozantinib  for about 7-8 weeks before symptoms recurred Today he reports feeling much better.  Diarrhea has improved. He is going to have close in September and is interested in chemotherapy break.  Review of Systems  Constitutional:  Positive for fatigue. Negative for appetite change, chills, fever and unexpected weight change.  HENT:   Negative for hearing loss and voice change.   Eyes:  Negative for eye problems and icterus.  Respiratory:  Negative for chest tightness, cough and shortness of breath.   Cardiovascular:  Negative for chest pain and leg swelling.  Gastrointestinal:  Negative for abdominal distention, abdominal pain, blood in stool, diarrhea and nausea.  Endocrine: Negative  for hot flashes.  Genitourinary:  Negative for difficulty urinating, dysuria and frequency.   Musculoskeletal:        Bilateral knee replacement.   Skin:  Negative for itching and rash.  Neurological:  Negative for extremity weakness, headaches, light-headedness and numbness.  Hematological:  Negative for adenopathy. Does not bruise/bleed easily.  Psychiatric/Behavioral:  Negative for confusion.     MEDICAL HISTORY:  Past Medical History:  Diagnosis Date   Arthritis    hands, left knee   Cancer (HCC)    COPD (chronic obstructive pulmonary disease)  (HCC)    Diabetes mellitus without complication (HCC)    Eczema 01/08/2017   GERD (gastroesophageal reflux disease)    History of kidney cancer    Hyperlipidemia LDL goal <100 11/02/2014   Hypertension    Kidney stone    YEAR H/O HEMATURIA   Psoriasis    Wears dentures    full upper, partial lower.  Has, does not wear    SURGICAL HISTORY: Past Surgical History:  Procedure Laterality Date   BRONCHIAL NEEDLE ASPIRATION BIOPSY  05/03/2023   Procedure: BRONCHIAL NEEDLE ASPIRATION BIOPSIES;  Surgeon: Malka Domino, MD;  Location: [facility];  Service: Pulmonary;;   COLONOSCOPY N/A 10/21/2021   Procedure: COLONOSCOPY;  Surgeon: Jinny Carmine, MD;  Location: [facility];  Service: Endoscopy;  Laterality: N/A;   COLONOSCOPY WITH PROPOFOL  N/A 02/23/2017   Procedure: COLONOSCOPY WITH PROPOFOL ;  Surgeon: Jinny Carmine, MD;  Location: [facility];  Service: Endoscopy;  Laterality: N/A;  Diabetic - oral meds   CYST EXCISION  12/2014   on back   CYSTOSCOPY W/ RETROGRADES Right 06/28/2018   Procedure: CYSTOSCOPY WITH RETROGRADE PYELOGRAM;  Surgeon: Francisca Redell BROCKS, MD;  Location: [facility];  Service: Urology;  Laterality: Right;   CYSTOSCOPY W/ URETERAL STENT REMOVAL Right 07/22/2018   Procedure: CYSTOSCOPY WITH STENT REMOVAL;  Surgeon: Francisca Redell BROCKS, MD;  Location: [facility];  Service: Urology;  Laterality: Right;   CYSTOSCOPY WITH BIOPSY Right 06/28/2018   Procedure: CYSTOSCOPY WITH RENAL BIOPSY;  Surgeon: Francisca Redell BROCKS, MD;  Location: [facility];  Service: Urology;  Laterality: Right;   CYSTOSCOPY WITH URETEROSCOPY AND STENT PLACEMENT Right 06/28/2018   Procedure: CYSTOSCOPY WITH URETEROSCOPY AND STENT PLACEMENT;  Surgeon: Francisca Redell BROCKS, MD;  Location: [facility];  Service: Urology;  Laterality: Right;   ESOPHAGOGASTRODUODENOSCOPY N/A 10/21/2021   Procedure: ESOPHAGOGASTRODUODENOSCOPY (EGD);  Surgeon: Jinny Carmine, MD;  Location: [facility];  Service:  Endoscopy;  Laterality: N/A;   FULGURATION OF BLADDER TUMOR N/A 06/28/2018   Procedure: CYSTOSCOPY BLADDER BIOPSY, FULGERATION OF BLADDER;  Surgeon: Francisca Redell BROCKS, MD;  Location: [facility];  Service: Urology;  Laterality: N/A;   JOINT REPLACEMENT Left    KNEE SURGERY Left    LAPAROSCOPIC NEPHRECTOMY, HAND ASSISTED Right 07/22/2018   Procedure: HAND ASSISTED LAPAROSCOPIC NEPHRECTOMY;  Surgeon: Francisca Redell BROCKS, MD;  Location: [facility];  Service: Urology;  Laterality: Right;   NASAL SINUS SURGERY  07/2013   POLYPECTOMY N/A 02/23/2017   Procedure: POLYPECTOMY;  Surgeon: Jinny Carmine, MD;  Location: [facility];  Service: Endoscopy;  Laterality: N/A;   POLYPECTOMY  10/21/2021   Procedure: POLYPECTOMY;  Surgeon: Jinny Carmine, MD;  Location: [facility];  Service: Endoscopy;;   REPLACEMENT TOTAL KNEE Right    tooth abstraction     TRANSURETHRAL RESECTION OF BLADDER TUMOR N/A 05/12/2019   Procedure: TRANSURETHRAL RESECTION OF BLADDER TUMOR (TURBT);  Surgeon: Francisca Redell BROCKS, MD;  Location: [facility];  Service: Urology;  Laterality: N/A;   VARICOCELE EXCISION     VIDEO BRONCHOSCOPY WITH ENDOBRONCHIAL ULTRASOUND N/A 05/03/2023   Procedure: VIDEO BRONCHOSCOPY WITH ENDOBRONCHIAL ULTRASOUND;  Surgeon: Malka Domino, MD;  Location: [facility];  Service: Pulmonary;  Laterality: N/A;   XI ROBOTIC ASSISTED VENTRAL HERNIA N/A 11/18/2020   Procedure: XI ROBOTIC ASSISTED VENTRAL HERNIA, incisional;  Surgeon: Jordis Laneta FALCON, MD;  Location: [facility];  Service: General;  Laterality: N/A;    SOCIAL HISTORY: Social History   Socioeconomic History   Marital status: Married    Spouse name: Jinnie   Number of children: Not on file   Years of education: Not on file   Highest education level: Not on file  Occupational History   Not on file  Tobacco Use   Smoking status: Former    Current packs/day: 0.00    Types: Cigarettes    Quit date: 06/15/2013    Years since quitting: 10.5    Smokeless tobacco: Never  Vaping Use   Vaping status: Never Used  Substance and Sexual Activity   Alcohol use: No    Alcohol/week: 0.0 standard drinks of alcohol   Drug use: Yes    Types: Marijuana   Sexual activity: Yes  Other Topics Concern   Not on file  Social History Narrative   Lives with wife, sister in law   Social Drivers of Health   Financial Resource Strain: Low Risk  (09/13/2021)   Received from [facility]   Overall Financial Resource Strain (CARDIA)    Difficulty of Paying Living Expenses: Not hard at all  Food Insecurity: No Food Insecurity (09/13/2021)   Received from [facility]   Hunger Vital Sign    Within the past 12 months, you worried that your food would run out before you got the money to buy more.: Never true    Within the past 12 months, the food you bought just didn't last and you didn't have money to get more.: Never true  Transportation Needs: No Transportation Needs (09/13/2021)   Received from [facility]   PRAPARE - Transportation    Lack of Transportation (Medical): No    Lack of Transportation (Non-Medical): No  Physical Activity: Not on file  Stress: Not on file  Social Connections: Not on file  Intimate Partner Violence: Not on file    FAMILY HISTORY: Family History  Problem Relation Age of Onset   Heart disease Father    Hypertension Father    COPD Father    Colon cancer Maternal Grandmother    Heart attack Paternal Grandfather     ALLERGIES:  is allergic to axitinib , propofol , and glucotrol [glipizide].  MEDICATIONS:  Current Outpatient Medications  Medication Sig Dispense Refill   albuterol  (VENTOLIN  HFA) 108 (90 Base) MCG/ACT inhaler USE 2 INHALATIONS ORALLY   EVERY 6 HOURS AS NEEDED FORWHEEZING OR SHORTNESS OF   BREATH 1 each 3   ALPRAZolam  (XANAX ) 0.5 MG tablet Take 1 tablet (0.5 mg total) by mouth 2 (two) times daily as needed for anxiety or sleep. 60 tablet 0   cabozantinib  (CABOMETYX ) 20 MG tablet Take 1 tablet  (20 mg total) by mouth daily. Take on an empty stomach, 1 hour before or 2 hours after meals. 30 tablet 2   clobetasol  ointment (TEMOVATE ) 0.05 % Apply 1 Application topically 2 (two) times daily as needed. 30 g 0   empagliflozin  (JARDIANCE ) 25 MG TABS tablet Take 1 tablet (25 mg total) by mouth daily before  breakfast. 90 tablet 1   fluticasone  (FLONASE ) 50 MCG/ACT nasal spray Place 2 sprays into both nostrils daily. 16 g 1   Fluticasone -Umeclidin-Vilant (TRELEGY ELLIPTA ) 100-62.5-25 MCG/ACT AEPB Inhale 1 puff into the lungs daily. 3 each 3   lisinopril  (ZESTRIL ) 20 MG tablet Take 1 tablet (20 mg total) by mouth daily. 90 tablet 3   omeprazole  (PRILOSEC) 20 MG capsule TAKE 1 CAPSULE DAILY AS    NEEDED FOR ACID REFLUX 90 capsule 0   ondansetron  (ZOFRAN ) 8 MG tablet Take 1 tablet (8 mg total) by mouth every 8 (eight) hours as needed for nausea or vomiting. 20 tablet 1   prochlorperazine  (COMPAZINE ) 10 MG tablet Take 1 tablet (10 mg total) by mouth every 6 (six) hours as needed. 30 tablet 1   rosuvastatin  (CRESTOR ) 20 MG tablet Take 1 tablet (20 mg total) by mouth daily with supper. 90 tablet 1   Semaglutide , 1 MG/DOSE, 4 MG/3ML SOPN Inject 1 mg as directed once a week. 3 mL 2   metFORMIN  (GLUCOPHAGE -XR) 500 MG 24 hr tablet Take 2 tablets (1,000 mg total) by mouth 2 (two) times daily with a meal. (Patient not taking: Reported on 01/08/2024) 180 tablet 3   No current facility-administered medications for this visit.     PHYSICAL EXAMINATION: ECOG PERFORMANCE STATUS: 1 - Symptomatic but completely ambulatory Vitals:   01/08/24 1401  BP: (!) 138/96  Pulse: 91  Resp: 16  Temp: (!) 97.3 F (36.3 C)  SpO2: 100%   Filed Weights   01/08/24 1401  Weight: 187 lb 6.4 oz (85 kg)      Physical Exam Constitutional:      General: He is not in acute distress. Eyes:     General: No scleral icterus. Cardiovascular:     Rate and Rhythm: Normal rate and regular rhythm.  Pulmonary:     Effort:  Pulmonary effort is normal. No respiratory distress.     Breath sounds: No wheezing.  Abdominal:     General: Bowel sounds are normal.     Palpations: Abdomen is soft.  Musculoskeletal:        General: No deformity. Normal range of motion.     Cervical back: Normal range of motion and neck supple.     Comments: Bilateral knee replacement.   Skin:    General: Skin is warm and dry.     Findings: No erythema or rash.  Neurological:     Mental Status: He is alert and oriented to person, place, and time. Mental status is at baseline.  Psychiatric:        Mood and Affect: Mood normal.     LABORATORY DATA:  I have reviewed the data as listed    Latest Ref Rng & Units 01/08/2024    1:46 PM 11/26/2023   12:42 PM 10/15/2023   12:42 PM  CBC  WBC 4.0 - 10.5 K/uL 5.5  7.1  6.5   Hemoglobin 13.0 - 17.0 g/dL 85.6  85.4  83.7   Hematocrit 39.0 - 52.0 % 41.8  43.0  48.1   Platelets 150 - 400 K/uL 190  186  189       Latest Ref Rng & Units 01/08/2024    1:46 PM 11/26/2023   12:42 PM 10/15/2023   12:42 PM  CMP  Glucose 70 - 99 mg/dL 99  899  892   BUN 6 - 20 mg/dL 15  21  17    Creatinine 0.61 - 1.24 mg/dL 9.09  8.94  1.00   Sodium 135 - 145 mmol/L 133  137  134   Potassium 3.5 - 5.1 mmol/L 4.6  4.1  4.2   Chloride 98 - 111 mmol/L 101  105  102   CO2 22 - 32 mmol/L 26  25  23    Calcium  8.9 - 10.3 mg/dL 9.1  8.5  8.7   Total Protein 6.5 - 8.1 g/dL 6.2  6.2  6.6   Total Bilirubin 0.0 - 1.2 mg/dL 0.9  0.8  0.8   Alkaline Phos 38 - 126 U/L 63  79  79   AST 15 - 41 U/L 20  30  32   ALT 0 - 44 U/L 22  29  38     RADIOGRAPHIC STUDIES: I have personally reviewed the radiological images as listed and agreed with the findings in the report.  CT CHEST ABDOMEN PELVIS WO CONTRAST Result Date: 12/17/2023 CLINICAL DATA:  Right renal cell carcinoma.  * Tracking Code: BO * EXAM: CT CHEST, ABDOMEN AND PELVIS WITHOUT CONTRAST TECHNIQUE: Multidetector CT imaging of the chest, abdomen and pelvis was performed  following the standard protocol without IV contrast. RADIATION DOSE REDUCTION: This exam was performed according to the departmental dose-optimization program which includes automated exposure control, adjustment of the mA and/or kV according to patient size and/or use of iterative reconstruction technique. COMPARISON:  08/24/2023. FINDINGS: CT CHEST FINDINGS Cardiovascular: Atherosclerotic calcification of the aorta with age advanced involvement of the left anterior descending coronary artery. Heart size normal. No pericardial effusion. Mediastinum/Nodes: No pathologically enlarged mediastinal or axillary lymph nodes. Hilar regions are difficult to definitively evaluate without IV contrast. Esophagus is grossly unremarkable. Lungs/Pleura: 4 mm apical segment right upper lobe nodule (4/29), unchanged. No new pulmonary nodules. No pleural fluid. Airway is unremarkable. Musculoskeletal: No worrisome lytic or sclerotic lesions. A small lipoma is seen in the left paraspinal musculature. CT ABDOMEN PELVIS FINDINGS Hepatobiliary: Liver and gallbladder are unremarkable. No biliary ductal dilatation. Pancreas: Negative. Spleen: Negative. Adrenals/Urinary Tract: Adrenal glands are unremarkable. Right nephrectomy with similar soft tissue thickening in the medial surgical bed. Small hyperdense and hypodense lesions in the left kidney are difficult to further characterize due to size and lack of postcontrast imaging, but appear grossly stable. Left ureter is decompressed. Bladder is grossly unremarkable. Stomach/Bowel: Stomach, small bowel, appendix and colon are unremarkable. Vascular/Lymphatic: Atherosclerotic calcification of the aorta. No pathologically enlarged lymph nodes. Reproductive: Prostate is visualized. Other: No free fluid.  Mesenteries and peritoneum are unremarkable. Musculoskeletal: Degenerative changes in the spine. No worrisome lytic or sclerotic lesions. IMPRESSION: 1. Small low and high attenuation lesions  in the left kidney are difficult to further characterize due to size and lack of postcontrast imaging but appear stable from 08/24/2023. Recommend continued attention on follow-up. 2. Otherwise, no evidence of recurrent or metastatic disease. 3. Age advanced left anterior descending coronary artery calcification. 4.  Aortic atherosclerosis (ICD10-I70.0). Electronically Signed   By: Newell Eke M.D.   On: 12/17/2023 17:42

## 2024-01-08 NOTE — Assessment & Plan Note (Signed)
 Recommend calcium and vitamin D supplementation.

## 2024-01-08 NOTE — Assessment & Plan Note (Signed)
 Improved after stopping cabozantinib  Recommend imodium  PRN as directed.

## 2024-01-16 ENCOUNTER — Encounter: Payer: Self-pay | Admitting: Internal Medicine

## 2024-01-16 DIAGNOSIS — R0602 Shortness of breath: Secondary | ICD-10-CM

## 2024-01-18 ENCOUNTER — Other Ambulatory Visit: Payer: Self-pay | Admitting: Emergency Medicine

## 2024-01-18 DIAGNOSIS — E1165 Type 2 diabetes mellitus with hyperglycemia: Secondary | ICD-10-CM

## 2024-01-18 MED ORDER — SEMAGLUTIDE (1 MG/DOSE) 4 MG/3ML ~~LOC~~ SOPN: 1.0000 mg | PEN_INJECTOR | SUBCUTANEOUS | 2 refills | Status: DC

## 2024-01-18 NOTE — Telephone Encounter (Signed)
 Refill pend to Dr.Andrews for refills

## 2024-01-22 ENCOUNTER — Other Ambulatory Visit: Payer: Self-pay

## 2024-01-22 DIAGNOSIS — E1165 Type 2 diabetes mellitus with hyperglycemia: Secondary | ICD-10-CM

## 2024-01-22 MED ORDER — SEMAGLUTIDE (1 MG/DOSE) 4 MG/3ML ~~LOC~~ SOPN: 1.0000 mg | PEN_INJECTOR | SUBCUTANEOUS | 2 refills | Status: DC

## 2024-02-12 ENCOUNTER — Encounter: Payer: Self-pay | Admitting: Oncology

## 2024-02-12 ENCOUNTER — Other Ambulatory Visit: Payer: Self-pay

## 2024-02-13 MED ORDER — ALPRAZOLAM 0.5 MG PO TABS: 0.5000 mg | ORAL_TABLET | Freq: Two times a day (BID) | ORAL | 0 refills | Status: DC | PRN

## 2024-02-20 ENCOUNTER — Other Ambulatory Visit: Payer: Self-pay | Admitting: Internal Medicine

## 2024-02-20 DIAGNOSIS — E1165 Type 2 diabetes mellitus with hyperglycemia: Secondary | ICD-10-CM

## 2024-02-20 DIAGNOSIS — E785 Hyperlipidemia, unspecified: Secondary | ICD-10-CM

## 2024-02-21 NOTE — Telephone Encounter (Signed)
 Requested Prescriptions  Pending Prescriptions Disp Refills   rosuvastatin  (CRESTOR ) 20 MG tablet [Pharmacy Med Name: ROSUVASTATIN  TAB 20MG ] 90 tablet 0    Sig: TAKE 1 TABLET DAILY WITH   SUPPER     Cardiovascular:  Antilipid - Statins 2 Failed - 02/21/2024  2:57 PM      Failed - Lipid Panel in normal range within the last 12 months    Cholesterol, Total  Date Value Ref Range Status  11/02/2014 157 100 - 199 mg/dL Final   Cholesterol  Date Value Ref Range Status  04/28/2022 134 <200 mg/dL Final  90/79/7985 767 (H) 0 - 200 mg/dL Final   Ldl Cholesterol, Calc  Date Value Ref Range Status  02/01/2013 162 (H) 0 - 100 mg/dL Final   LDL Cholesterol (Calc)  Date Value Ref Range Status  04/28/2022 69 mg/dL (calc) Final    Comment:    Reference range: <100 . Desirable range <100 mg/dL for primary prevention;   <70 mg/dL for patients with CHD or diabetic patients  with > or = 2 CHD risk factors. SABRA LDL-C is now calculated using the Martin-Hopkins  calculation, which is a validated novel method providing  better accuracy than the Friedewald equation in the  estimation of LDL-C.  Gladis APPLETHWAITE et al. SANDREA. 7986;689(80): 2061-2068  (http://education.[facility].com/faq/FAQ164)    HDL Cholesterol  Date Value Ref Range Status  02/01/2013 47 40 - 60 mg/dL Final   HDL  Date Value Ref Range Status  04/28/2022 46 > OR = 40 mg/dL Final  93/79/7983 50 >60 mg/dL Final    Comment:    According to ATP-III Guidelines, HDL-C >59 mg/dL is considered a negative risk factor for CHD.    Triglycerides  Date Value Ref Range Status  04/28/2022 103 <150 mg/dL Final  90/79/7985 882 0 - 200 mg/dL Final         Passed - Cr in normal range and within 360 days    Creatinine  Date Value Ref Range Status  01/08/2024 0.90 0.61 - 1.24 mg/dL Final   Creat  Date Value Ref Range Status  08/05/2018 1.04 0.70 - 1.33 mg/dL Final    Comment:    For patients >39 years of age, the reference limit for  Creatinine is approximately 13% higher for people identified as African-American. .    Creatinine, Urine  Date Value Ref Range Status  07/11/2023 107 20 - 320 mg/dL Final         Passed - Patient is not pregnant      Passed - Valid encounter within last 12 months    Recent Outpatient Visits           4 months ago Type 2 diabetes mellitus with hyperglycemia, without long-term current use of insulin  [facility])   [place] [facility] Bernardo Fend, DO   7 months ago Type 2 diabetes mellitus with hyperglycemia, without long-term current use of insulin  [facility])   [place] [facility] Bernardo Fend, DO       Future Appointments             In 1 month Bernardo Fend, DO [place] [facility] [place]             empagliflozin  (JARDIANCE ) 25 MG TABS tablet [Pharmacy Med Name: JARDIANCE  TAB 25MG ] 90 tablet 0    Sig: TAKE 1 TABLET DAILY BEFORE BREAKFAST     Endocrinology:  Diabetes - SGLT2 Inhibitors Passed - 02/21/2024  2:57 PM  Passed - Cr in normal range and within 360 days    Creatinine  Date Value Ref Range Status  01/08/2024 0.90 0.61 - 1.24 mg/dL Final   Creat  Date Value Ref Range Status  08/05/2018 1.04 0.70 - 1.33 mg/dL Final    Comment:    For patients >26 years of age, the reference limit for Creatinine is approximately 13% higher for people identified as African-American. .    Creatinine, Urine  Date Value Ref Range Status  07/11/2023 107 20 - 320 mg/dL Final         Passed - HBA1C is between 0 and 7.9 and within 180 days    Hemoglobin A1C  Date Value Ref Range Status  10/10/2023 6.8 (A) 4.0 - 5.6 % Final  02/01/2013 10.8 (H) 4.2 - 6.3 % Final    Comment:    The American Diabetes Association recommends that a primary goal of therapy should be <7% and that physicians should reevaluate the treatment regimen in patients with HbA1c values consistently >8%.    Hgb A1c MFr Bld  Date  Value Ref Range Status  04/28/2022 7.4 (H) <5.7 % of total Hgb Final    Comment:    For someone without known diabetes, a hemoglobin A1c value of 6.5% or greater indicates that they may have  diabetes and this should be confirmed with a follow-up  test. . For someone with known diabetes, a value <7% indicates  that their diabetes is well controlled and a value  greater than or equal to 7% indicates suboptimal  control. A1c targets should be individualized based on  duration of diabetes, age, comorbid conditions, and  other considerations. . Currently, no consensus exists regarding use of hemoglobin A1c for diagnosis of diabetes for children. .          Passed - eGFR in normal range and within 360 days    GFR, Est African American  Date Value Ref Range Status  08/05/2018 95 > OR = 60 mL/min/1.11m2 Final   GFR calc Af Amer  Date Value Ref Range Status  01/26/2020 >60 >60 mL/min Final   GFR, Est Non African American  Date Value Ref Range Status  08/05/2018 82 > OR = 60 mL/min/1.16m2 Final   GFR, Estimated  Date Value Ref Range Status  01/08/2024 >60 >60 mL/min Final    Comment:    (NOTE) Calculated using the CKD-EPI Creatinine Equation (2021)          Passed - Valid encounter within last 6 months    Recent Outpatient Visits           4 months ago Type 2 diabetes mellitus with hyperglycemia, without long-term current use of insulin  [facility])   [place] [facility] Bernardo Fend, DO   7 months ago Type 2 diabetes mellitus with hyperglycemia, without long-term current use of insulin  [facility])   [facility] Bernardo Fend, DO       Future Appointments             In 1 month Bernardo Fend, DO [facility] [place]

## 2024-03-04 ENCOUNTER — Other Ambulatory Visit: Payer: Self-pay | Admitting: Internal Medicine

## 2024-03-04 DIAGNOSIS — K219 Gastro-esophageal reflux disease without esophagitis: Secondary | ICD-10-CM

## 2024-03-06 NOTE — Telephone Encounter (Signed)
 Requested Prescriptions  Pending Prescriptions Disp Refills   omeprazole  (PRILOSEC) 20 MG capsule [Pharmacy Med Name: OMEPRAZOL RX CAP 20MG ] 90 capsule 0    Sig: TAKE 1 CAPSULE DAILY AS    NEEDED FOR ACID REFLUX     Gastroenterology: Proton Pump Inhibitors Passed - 03/06/2024  8:27 AM      Passed - Valid encounter within last 12 months    Recent Outpatient Visits           4 months ago Type 2 diabetes mellitus with hyperglycemia, without long-term current use of insulin  [facility])   [place] [facility] Bernardo Fend, DO   7 months ago Type 2 diabetes mellitus with hyperglycemia, without long-term current use of insulin  [facility])   [facility] Bernardo Fend, DO       Future Appointments             In 1 month Bernardo Fend, DO [facility] [place]

## 2024-03-11 ENCOUNTER — Inpatient Hospital Stay: Attending: Oncology

## 2024-03-11 ENCOUNTER — Encounter: Payer: Self-pay | Admitting: Oncology

## 2024-03-11 ENCOUNTER — Inpatient Hospital Stay (HOSPITAL_BASED_OUTPATIENT_CLINIC_OR_DEPARTMENT_OTHER): Admitting: Oncology

## 2024-03-11 VITALS — BP 149/90 | HR 89 | Temp 96.5°F | Resp 16 | Wt 191.3 lb

## 2024-03-11 DIAGNOSIS — Z9226 Personal history of immune checkpoint inhibitor therapy: Secondary | ICD-10-CM | POA: Diagnosis not present

## 2024-03-11 DIAGNOSIS — C7802 Secondary malignant neoplasm of left lung: Secondary | ICD-10-CM | POA: Insufficient documentation

## 2024-03-11 DIAGNOSIS — Z79899 Other long term (current) drug therapy: Secondary | ICD-10-CM | POA: Insufficient documentation

## 2024-03-11 DIAGNOSIS — E119 Type 2 diabetes mellitus without complications: Secondary | ICD-10-CM | POA: Diagnosis not present

## 2024-03-11 DIAGNOSIS — J449 Chronic obstructive pulmonary disease, unspecified: Secondary | ICD-10-CM | POA: Insufficient documentation

## 2024-03-11 DIAGNOSIS — Z7985 Long-term (current) use of injectable non-insulin antidiabetic drugs: Secondary | ICD-10-CM | POA: Diagnosis not present

## 2024-03-11 DIAGNOSIS — Z5111 Encounter for antineoplastic chemotherapy: Secondary | ICD-10-CM | POA: Diagnosis not present

## 2024-03-11 DIAGNOSIS — K521 Toxic gastroenteritis and colitis: Secondary | ICD-10-CM

## 2024-03-11 DIAGNOSIS — C7801 Secondary malignant neoplasm of right lung: Secondary | ICD-10-CM | POA: Diagnosis not present

## 2024-03-11 DIAGNOSIS — C641 Malignant neoplasm of right kidney, except renal pelvis: Secondary | ICD-10-CM | POA: Diagnosis present

## 2024-03-11 DIAGNOSIS — Z905 Acquired absence of kidney: Secondary | ICD-10-CM | POA: Diagnosis not present

## 2024-03-11 DIAGNOSIS — I2510 Atherosclerotic heart disease of native coronary artery without angina pectoris: Secondary | ICD-10-CM | POA: Diagnosis not present

## 2024-03-11 DIAGNOSIS — I10 Essential (primary) hypertension: Secondary | ICD-10-CM | POA: Diagnosis not present

## 2024-03-11 DIAGNOSIS — E785 Hyperlipidemia, unspecified: Secondary | ICD-10-CM | POA: Insufficient documentation

## 2024-03-11 DIAGNOSIS — C7911 Secondary malignant neoplasm of bladder: Secondary | ICD-10-CM | POA: Insufficient documentation

## 2024-03-11 DIAGNOSIS — C771 Secondary and unspecified malignant neoplasm of intrathoracic lymph nodes: Secondary | ICD-10-CM | POA: Diagnosis present

## 2024-03-11 DIAGNOSIS — T451X5A Adverse effect of antineoplastic and immunosuppressive drugs, initial encounter: Secondary | ICD-10-CM

## 2024-03-11 DIAGNOSIS — Z87891 Personal history of nicotine dependence: Secondary | ICD-10-CM | POA: Insufficient documentation

## 2024-03-11 DIAGNOSIS — Z7984 Long term (current) use of oral hypoglycemic drugs: Secondary | ICD-10-CM | POA: Diagnosis not present

## 2024-03-11 DIAGNOSIS — Z800 Family history of malignant neoplasm of digestive organs: Secondary | ICD-10-CM | POA: Insufficient documentation

## 2024-03-11 LAB — CBC WITH DIFFERENTIAL (CANCER CENTER ONLY)
Abs Immature Granulocytes: 0.02 K/uL (ref 0.00–0.07)
Basophils Absolute: 0 K/uL (ref 0.0–0.1)
Basophils Relative: 0 %
Eosinophils Absolute: 0.1 K/uL (ref 0.0–0.5)
Eosinophils Relative: 1 %
HCT: 48.4 % (ref 39.0–52.0)
Hemoglobin: 16.2 g/dL (ref 13.0–17.0)
Immature Granulocytes: 0 %
Lymphocytes Relative: 29 %
Lymphs Abs: 2 K/uL (ref 0.7–4.0)
MCH: 31.3 pg (ref 26.0–34.0)
MCHC: 33.5 g/dL (ref 30.0–36.0)
MCV: 93.4 fL (ref 80.0–100.0)
Monocytes Absolute: 0.4 K/uL (ref 0.1–1.0)
Monocytes Relative: 6 %
Neutro Abs: 4.3 K/uL (ref 1.7–7.7)
Neutrophils Relative %: 64 %
Platelet Count: 182 K/uL (ref 150–400)
RBC: 5.18 MIL/uL (ref 4.22–5.81)
RDW: 12.9 % (ref 11.5–15.5)
WBC Count: 6.8 K/uL (ref 4.0–10.5)
nRBC: 0 % (ref 0.0–0.2)

## 2024-03-11 LAB — CMP (CANCER CENTER ONLY)
ALT: 27 U/L (ref 0–44)
AST: 28 U/L (ref 15–41)
Albumin: 4.3 g/dL (ref 3.5–5.0)
Alkaline Phosphatase: 86 U/L (ref 38–126)
Anion gap: 8 (ref 5–15)
BUN: 19 mg/dL (ref 6–20)
CO2: 25 mmol/L (ref 22–32)
Calcium: 9.2 mg/dL (ref 8.9–10.3)
Chloride: 100 mmol/L (ref 98–111)
Creatinine: 1.02 mg/dL (ref 0.61–1.24)
GFR, Estimated: >60 mL/min (ref >60)
Glucose, Bld: 127 mg/dL — ABNORMAL HIGH (ref 70–99)
Potassium: 4.9 mmol/L (ref 3.5–5.1)
Sodium: 133 mmol/L — ABNORMAL LOW (ref 135–145)
Total Bilirubin: 1.1 mg/dL (ref 0.0–1.2)
Total Protein: 7.5 g/dL (ref 6.5–8.1)

## 2024-03-11 LAB — LACTATE DEHYDROGENASE: LDH: 134 U/L (ref 98–192)

## 2024-03-11 NOTE — Progress Notes (Signed)
 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  Recent CT showed thoracic lymphadenopathy which were biopsy proven recurrent metastatic RCC 2nd line treatment with cabozantinib  60mg  daily--> did not tolerate, after 3 weeks chemo break  switched to 40mg  daily on 07/29/2023 --> chemo break from 10/15/23-11/05/23 due to side effects.--> resumed on cabozantinib  40mg  until 12/28/23, stopped due to side effects. --> chemo break, started back on cabozantinib  20mg  daily.  Labs are reviewed and discussed with patient. Continue cabozantinib  to 20 mg daily Repeat CT scan in Sept 2025   Chemotherapy induced diarrhea Improved  Recommend imodium  PRN as directed.   Encounter for antineoplastic chemotherapy Treatment plan as listed above.   Hypocalcemia Recommend calcium  and vitamin D  supplementation.   Orders Placed This Encounter  Procedures   CT CHEST ABDOMEN PELVIS WO CONTRAST    Standing Status:   Future    Expected Date:   04/24/2024    Expiration Date:   03/11/2025    Preferred imaging location?:    [facility]    If indicated for the ordered procedure, I authorize the administration of oral contrast media per Radiology protocol:   Yes    Does the patient have a contrast media/X-ray dye allergy?:   No   CBC with Differential (Cancer Center Only)    Standing Status:   Future    Expected Date:   04/15/2024    Expiration Date:   07/14/2024   CMP (Cancer Center only)    Standing Status:   Future    Expected Date:   04/15/2024    Expiration Date:   07/14/2024   Lactate dehydrogenase    Standing Status:   Future    Expected Date:   04/15/2024    Expiration Date:   07/14/2024   Follow up: end of Oct All  questions were answered. The patient knows to call the clinic with any problems, questions or concerns.  Zelphia Cap, MD, PhD [facility] Hematology Oncology 03/11/2024     HISTORY OF PRESENTING ILLNESS:   Matthew Woodard is a  58 y.o.  male presents for follow up for metastatic kidney cancer Oncology History  Metastatic renal cell carcinoma (HCC)  06/2018 Imaging   CT urogram done which showed right 4 cm central enhancing renal mass with invasion into the collecting system. X-ray of chest was performed to see complete staging which showed no concerning findings   06/28/2018 Cancer Diagnosis      06/28/2018 Procedure    patient underwent a cystoscopy, bladder biopsy and a right retrograde pyelogram with intraoperative interpretation, right diagnostic ureteroscopy, right renal pelvis biopsy, right ureteral stent placement.  biopsy showed atypical cell clusters, with extensive crush artifact, nondiagnostic.     07/22/2018 Initial Diagnosis   Patient underwent right radical laparoscopic nephrectomy with biopsy showing 5.3 cm RCC, clear cell type, grade 3, tumor invades renal vein and segmental branches, pelvic calyceal system and perirenal sinus/fat.  Surgical margins negative for tumor.pT3a,Nx Post-operation, patient was on surveillance after surgery.   10/28/2018 Imaging   CT abdomen pelvis with contrast showed minimal fluid and or postoperative changes within the right renal fossa.  No evidence of metastatic disease or recurrence.  Patient has left kidney lesion previously characterized as nonenhancing cyst by multi phasic contrast-enhanced CT.  04/30/2019 Progression   Patient reports an episode of gross hematuria in early December 2020, patient is due for 22-month surveillance CT scan. 04/30/2019 CT abdomen pelvis with contrast showed high attenuation mass at the right uretero vesicle junction, with extension into the bladder.  Bilateral pulmonary nodules, most indicated for metastatic  disease.  Patient underwent cystoscopy and transurethral resection of irregular bladder tumor 3 cm.  Mass appeared to emanate from the right ureteral orifice.  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  #NGS: [employer] PD L1 TPS 1%   05/28/2019 Imaging   CT chest w contrast 1. Numerous bilateral pulmonary nodules measuring up to 10 mm diameter. Imaging features compatible with metastatic disease. 2. Left hilar lymphadenopathy, also highly suspicious for metastatic involvement.    05/28/2019 Imaging   Bone scan showed  No scintigraphic evidence of osseous metastatic disease.    06/05/2019 - 06/21/2021 Chemotherapy   06/05/19 Axitinib  5mg  BID+ Keytruda   09/2019 off axitinib  and Keytruda  since the beginning of May due to transaminitis. GI work up negative.  Ultrasound liver vascular Doppler is negative #11/24/2019, resumed on Keytruda  every 3 weeks #01/05/2020, axitinib  was resumed 3 mg #01/20/2020, axitinib  was discontinued due to recurrence of transaminitis.  Keytruda  was temporarily held. #03/16/2020-06/21/2021 resumed on Keytruda  every 3 weeks Keytruda  was discontinued due to recurrent immunotherapy induced diarrhea    07/25/2019 Imaging   Brain w wo contrast No mass, hemorrhage, or abnormal enhancement.   09/29/2020 Imaging   CT scan showed no evidence of local recurrence or new/progressive disease in the chest abdomen or pelvis.  Tiny nonspecific lung nodules are unchanged.  No new lung nodules   01/14/2021 Imaging   CT chest abdomen pelvis showed stable exam, no new or progressive metastatic disease within chest abdomen pelvis. Stable lung nodules. Aortic atherosclerosis.    04/15/2021 Imaging   CT chest abdomen pelvis with contrast showed stable examination.  No evidence of recurrent or metastatic carcinoma within the chest abdomen pelvis.     08/02/2021 Imaging   CT chest abdomen pelvis without contrast, stable examination without evidence of  recurrence or metastatic disease.  Mild asymmetric diffuse esophageal wall thickening, similar to prior examination.  Likely chronic esophagitis.  Aortic atherosclerosis   10/21/2021 Procedure   colonoscopy showed 3 mm polyp which was resected and retrieved.  Negative for dysplasia/malignancy.  Random biopsy showed lymphocytic colitis. EGD showed long segment Barrett's esophagus.  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  Negative for dysplasia and malignancy.  Small bowel cold biopsy showed reactive duodenitis.  Negative for CMV.  Negative for dysplasia or malignancy.   11/16/2021 Imaging   CT chest abdomen pelvis without contrast showed stable examination without evidence of local recurrence or active malignant disease in the chest abdomen pelvis.  Reflux retained contrast in the esophagus suggestive of esophagitis.  Aortic atherosclerosis   07/03/2022 Imaging   CT chest abdomen pelvis w contrast   1. Stable examination status post right nephrectomy without evidence of new or progressive disease in the chest, abdomen or pelvis. 2. Stable scattered tiny pulmonary nodules favored benign etiology. 3.  Aortic Atherosclerosis    01/04/2023 Imaging   CT chest abdomen pelvis wo contrast showed  1. New clustered nodularity and ground-glass in the peripheral right lower lobe, likely infectious/inflammatory. Additionally there are new enlarged mediastinal lymph nodes which given the above findings are favored reactive but nonspecific. Suggest attention on short-term interval follow-up dedicated chest CT to ensure resolution. 2.  Stable scattered tiny pulmonary nodules. 3. No evidence of local recurrence or metastatic disease in the abdomen or pelvis. 4.  Aortic Atherosclerosis   04/09/2023 Imaging   CT chest wo contrast  1. Persistent right paratracheal and subcarinal adenopathy. These are stable to slightly increased in the interval (on the order of 1-2  mm). 2. Previously noted left upper lobe and right upper lobe lung nodules are not significantly changed in the interval. 3. New part solid nodule within the anterior left upper lobe measures 6 mm. The appearance is nonspecific. This may be postinflammatory or infectious in etiology. Consider short-term interval follow-up with repeat CT of the chest in 3 months to ensure resolution. 4. Stable appearance of focal area of ground-glass attenuation and interstitial prominence within the periphery of the right upper lobe. Favor sequelae of inflammation or infection 5. Coronary artery calcifications. 6.  Aortic Atherosclerosis    05/03/2023 Relapse/Recurrence   Biopsy via bronchoscopy  A. LYMPH NODE, 4R, FINE NEEDLE ASPIRATION:  - Carcinoma, consistent with renal cell carcinoma  - No definitive lymphoid tissue identified   B. LYMPH NODE, 7, FINE NEEDLE ASPIRATION:  - Carcinoma, consistent with renal cell carcinoma  - Lymphoid tissue identified   C. LYMPH NODE, 11R, FINE NEEDLE ASPIRATION:  - Carcinoma, consistent with renal cell carcinoma  - Lymphoid tissue identified    05/30/2023 - 07/09/2023 Chemotherapy   Cabozantinib  60mg  daily, stopped due to mucositis, palmar plantar erythrodysesthesia   07/29/2023 -  Chemotherapy   Cabozantinib  40mg  daily   12/17/2023 Imaging   CT chest abdomen pelvis  1. Small low and high attenuation lesions in the left kidney are difficult to further characterize due to size and lack of postcontrast imaging but appear stable from 08/24/2023. Recommend continued attention on follow-up. 2. Otherwise, no evidence of recurrent or metastatic disease. 3. Age advanced left anterior descending coronary artery calcification. 4.  Aortic atherosclerosis (ICD10-I70.0)   Clear cell carcinoma of right kidney (HCC)     #History of cutaneous psoriasis, mostly on his right hand,    INTERVAL HISTORY Matthew Woodard is a 58 y.o. male who has above history  reviewed by me today presents for follow up visit for management of metastatic RCC. Patient reports doing well. He enjoyed his chemo break and cruise trip.  Restarted on cabozantinib  20mg  daily.  reports he has been tolerating new med dosage well. Denies any GI side effects and improved appetite.  Diarrhea has improved.   Review of Systems  Constitutional:  Positive for fatigue. Negative for appetite change, chills, fever and unexpected weight change.  HENT:   Negative for hearing loss and voice change.   Eyes:  Negative for eye problems and icterus.  Respiratory:  Negative for chest tightness, cough and shortness of breath.   Cardiovascular:  Negative for chest pain and leg swelling.  Gastrointestinal:  Negative for abdominal distention, abdominal pain, blood in stool, diarrhea and nausea.  Endocrine: Negative for hot flashes.  Genitourinary:  Negative for difficulty urinating, dysuria and frequency.   Musculoskeletal:        Bilateral knee replacement.   Skin:  Negative for itching and rash.  Neurological:  Negative for extremity weakness, headaches, light-headedness and numbness.  Hematological:  Negative for adenopathy. Does not bruise/bleed easily.  Psychiatric/Behavioral:  Negative for confusion.     MEDICAL HISTORY:  Past Medical History:  Diagnosis Date   Arthritis    hands, left knee   Cancer (HCC)    COPD (chronic obstructive pulmonary  disease) (HCC)    Diabetes mellitus without complication (HCC)    Eczema 01/08/2017   GERD (gastroesophageal reflux disease)    History of kidney cancer    Hyperlipidemia LDL goal <100 11/02/2014   Hypertension    Kidney stone    YEAR H/O HEMATURIA   Psoriasis    Wears dentures    full upper, partial lower.  Has, does not wear    SURGICAL HISTORY: Past Surgical History:  Procedure Laterality Date   BRONCHIAL NEEDLE ASPIRATION BIOPSY  05/03/2023   Procedure: BRONCHIAL NEEDLE ASPIRATION BIOPSIES;  Surgeon: Malka Domino,  MD;  Location: [facility];  Service: Pulmonary;;   COLONOSCOPY N/A 10/21/2021   Procedure: COLONOSCOPY;  Surgeon: Jinny Carmine, MD;  Location: [facility];  Service: Endoscopy;  Laterality: N/A;   COLONOSCOPY WITH PROPOFOL  N/A 02/23/2017   Procedure: COLONOSCOPY WITH PROPOFOL ;  Surgeon: Jinny Carmine, MD;  Location: [facility];  Service: Endoscopy;  Laterality: N/A;  Diabetic - oral meds   CYST EXCISION  12/2014   on back   CYSTOSCOPY W/ RETROGRADES Right 06/28/2018   Procedure: CYSTOSCOPY WITH RETROGRADE PYELOGRAM;  Surgeon: Francisca Redell BROCKS, MD;  Location: [facility];  Service: Urology;  Laterality: Right;   CYSTOSCOPY W/ URETERAL STENT REMOVAL Right 07/22/2018   Procedure: CYSTOSCOPY WITH STENT REMOVAL;  Surgeon: Francisca Redell BROCKS, MD;  Location: [facility];  Service: Urology;  Laterality: Right;   CYSTOSCOPY WITH BIOPSY Right 06/28/2018   Procedure: CYSTOSCOPY WITH RENAL BIOPSY;  Surgeon: Francisca Redell BROCKS, MD;  Location: [facility];  Service: Urology;  Laterality: Right;   CYSTOSCOPY WITH URETEROSCOPY AND STENT PLACEMENT Right 06/28/2018   Procedure: CYSTOSCOPY WITH URETEROSCOPY AND STENT PLACEMENT;  Surgeon: Francisca Redell BROCKS, MD;  Location: [facility];  Service: Urology;  Laterality: Right;   ESOPHAGOGASTRODUODENOSCOPY N/A 10/21/2021   Procedure: ESOPHAGOGASTRODUODENOSCOPY (EGD);  Surgeon: Jinny Carmine, MD;  Location: [facility];  Service: Endoscopy;  Laterality: N/A;   FULGURATION OF BLADDER TUMOR N/A 06/28/2018   Procedure: CYSTOSCOPY BLADDER BIOPSY, FULGERATION OF BLADDER;  Surgeon: Francisca Redell BROCKS, MD;  Location: [facility];  Service: Urology;  Laterality: N/A;   JOINT REPLACEMENT Left    KNEE SURGERY Left    LAPAROSCOPIC NEPHRECTOMY, HAND ASSISTED Right 07/22/2018   Procedure: HAND ASSISTED LAPAROSCOPIC NEPHRECTOMY;  Surgeon: Francisca Redell BROCKS, MD;  Location: [facility];  Service: Urology;  Laterality: Right;   NASAL SINUS SURGERY  07/2013   POLYPECTOMY N/A 02/23/2017    Procedure: POLYPECTOMY;  Surgeon: Jinny Carmine, MD;  Location: [facility];  Service: Endoscopy;  Laterality: N/A;   POLYPECTOMY  10/21/2021   Procedure: POLYPECTOMY;  Surgeon: Jinny Carmine, MD;  Location: [facility];  Service: Endoscopy;;   REPLACEMENT TOTAL KNEE Right    tooth abstraction     TRANSURETHRAL RESECTION OF BLADDER TUMOR N/A 05/12/2019   Procedure: TRANSURETHRAL RESECTION OF BLADDER TUMOR (TURBT);  Surgeon: Francisca Redell BROCKS, MD;  Location: [facility];  Service: Urology;  Laterality: N/A;   VARICOCELE EXCISION     VIDEO BRONCHOSCOPY WITH ENDOBRONCHIAL ULTRASOUND N/A 05/03/2023   Procedure: VIDEO BRONCHOSCOPY WITH ENDOBRONCHIAL ULTRASOUND;  Surgeon: Malka Domino, MD;  Location: [facility];  Service: Pulmonary;  Laterality: N/A;   XI ROBOTIC ASSISTED VENTRAL HERNIA N/A 11/18/2020   Procedure: XI ROBOTIC ASSISTED VENTRAL HERNIA, incisional;  Surgeon: Jordis Laneta FALCON, MD;  Location: [facility];  Service: General;  Laterality: N/A;    SOCIAL HISTORY: Social History   Socioeconomic History   Marital status: Married  Spouse name: Jinnie   Number of children: Not on file   Years of education: Not on file   Highest education level: Not on file  Occupational History   Not on file  Tobacco Use   Smoking status: Former    Current packs/day: 0.00    Types: Cigarettes    Quit date: 06/15/2013    Years since quitting: 10.7   Smokeless tobacco: Never  Vaping Use   Vaping status: Never Used  Substance and Sexual Activity   Alcohol use: No    Alcohol/week: 0.0 standard drinks of alcohol   Drug use: Yes    Types: Marijuana   Sexual activity: Yes  Other Topics Concern   Not on file  Social History Narrative   Lives with wife, sister in law   Social Drivers of Health   Financial Resource Strain: Low Risk  (09/13/2021)   Received from [facility]   Overall Financial Resource Strain (CARDIA)    Difficulty of Paying Living Expenses: Not hard at all   Food Insecurity: No Food Insecurity (09/13/2021)   Received [facility]   Hunger Vital Sign    Within the past 12 months, you worried that your food would run out before you got the money to buy more.: Never true    Within the past 12 months, the food you bought just didn't last and you didn't have money to get more.: Never true  Transportation Needs: No Transportation Needs (09/13/2021)   Received from [facility]   PRAPARE - Transportation    Lack of Transportation (Medical): No    Lack of Transportation (Non-Medical): No  Physical Activity: Not on file  Stress: Not on file  Social Connections: Not on file  Intimate Partner Violence: Not on file    FAMILY HISTORY: Family History  Problem Relation Age of Onset   Heart disease Father    Hypertension Father    COPD Father    Colon cancer Maternal Grandmother    Heart attack Paternal Grandfather     ALLERGIES:  is allergic to axitinib , propofol , and glucotrol [glipizide].  MEDICATIONS:  Current Outpatient Medications  Medication Sig Dispense Refill   albuterol  (VENTOLIN  HFA) 108 (90 Base) MCG/ACT inhaler USE 2 INHALATIONS ORALLY   EVERY 6 HOURS AS NEEDED FORWHEEZING OR SHORTNESS OF   BREATH 1 each 3   ALPRAZolam  (XANAX ) 0.5 MG tablet Take 1 tablet (0.5 mg total) by mouth 2 (two) times daily as needed for anxiety or sleep. 60 tablet 0   cabozantinib  (CABOMETYX ) 20 MG tablet Take 1 tablet (20 mg total) by mouth daily. Take on an empty stomach, 1 hour before or 2 hours after meals. 30 tablet 2   clobetasol  ointment (TEMOVATE ) 0.05 % Apply 1 Application topically 2 (two) times daily as needed. 30 g 0   empagliflozin  (JARDIANCE ) 25 MG TABS tablet TAKE 1 TABLET DAILY BEFORE BREAKFAST 90 tablet 0   fluticasone  (FLONASE ) 50 MCG/ACT nasal spray Place 2 sprays into both nostrils daily. 16 g 1   Fluticasone -Umeclidin-Vilant (TRELEGY ELLIPTA ) 100-62.5-25 MCG/ACT AEPB Inhale 1 puff into the lungs daily. 3 each 3   lisinopril   (ZESTRIL ) 20 MG tablet Take 1 tablet (20 mg total) by mouth daily. 90 tablet 3   omeprazole  (PRILOSEC) 20 MG capsule TAKE 1 CAPSULE DAILY AS    NEEDED FOR ACID REFLUX 90 capsule 0   ondansetron  (ZOFRAN ) 8 MG tablet Take 1 tablet (8 mg total) by mouth every 8 (eight) hours as  needed for nausea or vomiting. 20 tablet 1   prochlorperazine  (COMPAZINE ) 10 MG tablet Take 1 tablet (10 mg total) by mouth every 6 (six) hours as needed. 30 tablet 1   rosuvastatin  (CRESTOR ) 20 MG tablet TAKE 1 TABLET DAILY WITH   SUPPER 90 tablet 0   Semaglutide , 1 MG/DOSE, 4 MG/3ML SOPN Inject 1 mg as directed once a week. 3 mL 2   metFORMIN  (GLUCOPHAGE -XR) 500 MG 24 hr tablet Take 2 tablets (1,000 mg total) by mouth 2 (two) times daily with a meal. (Patient not taking: Reported on 03/11/2024) 180 tablet 3   No current facility-administered medications for this visit.     PHYSICAL EXAMINATION: ECOG PERFORMANCE STATUS: 1 - Symptomatic but completely ambulatory Vitals:   03/11/24 0958  BP: (!) 149/90  Pulse: 89  Resp: 16  Temp: (!) 96.5 F (35.8 C)  SpO2: 99%   Filed Weights   03/11/24 0958  Weight: 191 lb 4.8 oz (86.8 kg)      Physical Exam Constitutional:      General: He is not in acute distress. Eyes:     General: No scleral icterus. Cardiovascular:     Rate and Rhythm: Normal rate and regular rhythm.  Pulmonary:     Effort: Pulmonary effort is normal. No respiratory distress.     Breath sounds: No wheezing.  Abdominal:     General: Bowel sounds are normal.     Palpations: Abdomen is soft.  Musculoskeletal:        General: No deformity. Normal range of motion.     Cervical back: Normal range of motion and neck supple.     Comments: Bilateral knee replacement.   Skin:    General: Skin is warm and dry.     Findings: No erythema or rash.  Neurological:     Mental Status: He is alert and oriented to person, place, and time. Mental status is at baseline.  Psychiatric:        Mood and Affect:  Mood normal.     LABORATORY DATA:  I have reviewed the data as listed    Latest Ref Rng & Units 03/11/2024    9:41 AM 01/08/2024    1:46 PM 11/26/2023   12:42 PM  CBC  WBC 4.0 - 10.5 K/uL 6.8  5.5  7.1   Hemoglobin 13.0 - 17.0 g/dL 83.7  85.6  85.4   Hematocrit 39.0 - 52.0 % 48.4  41.8  43.0   Platelets 150 - 400 K/uL 182  190  186       Latest Ref Rng & Units 03/11/2024    9:41 AM 01/08/2024    1:46 PM 11/26/2023   12:42 PM  CMP  Glucose 70 - 99 mg/dL 872  99  899   BUN 6 - 20 mg/dL 19  15  21    Creatinine 0.61 - 1.24 mg/dL 8.97  9.09  8.94   Sodium 135 - 145 mmol/L 133  133  137   Potassium 3.5 - 5.1 mmol/L 4.9  4.6  4.1   Chloride 98 - 111 mmol/L 100  101  105   CO2 22 - 32 mmol/L 25  26  25    Calcium  8.9 - 10.3 mg/dL 9.2  9.1  8.5   Total Protein 6.5 - 8.1 g/dL 7.5  6.2  6.2   Total Bilirubin 0.0 - 1.2 mg/dL 1.1  0.9  0.8   Alkaline Phos 38 - 126 U/L 86  63  79  AST 15 - 41 U/L 28  20  30    ALT 0 - 44 U/L 27  22  29      RADIOGRAPHIC STUDIES: I have personally reviewed the radiological images as listed and agreed with the findings in the report.  CT CHEST ABDOMEN PELVIS WO CONTRAST Result Date: 12/17/2023 CLINICAL DATA:  Right renal cell carcinoma.  * Tracking Code: BO * EXAM: CT CHEST, ABDOMEN AND PELVIS WITHOUT CONTRAST TECHNIQUE: Multidetector CT imaging of the chest, abdomen and pelvis was performed following the standard protocol without IV contrast. RADIATION DOSE REDUCTION: This exam was performed according to the departmental dose-optimization program which includes automated exposure control, adjustment of the mA and/or kV according to patient size and/or use of iterative reconstruction technique. COMPARISON:  08/24/2023. FINDINGS: CT CHEST FINDINGS Cardiovascular: Atherosclerotic calcification of the aorta with age advanced involvement of the left anterior descending coronary artery. Heart size normal. No pericardial effusion. Mediastinum/Nodes: No pathologically  enlarged mediastinal or axillary lymph nodes. Hilar regions are difficult to definitively evaluate without IV contrast. Esophagus is grossly unremarkable. Lungs/Pleura: 4 mm apical segment right upper lobe nodule (4/29), unchanged. No new pulmonary nodules. No pleural fluid. Airway is unremarkable. Musculoskeletal: No worrisome lytic or sclerotic lesions. A small lipoma is seen in the left paraspinal musculature. CT ABDOMEN PELVIS FINDINGS Hepatobiliary: Liver and gallbladder are unremarkable. No biliary ductal dilatation. Pancreas: Negative. Spleen: Negative. Adrenals/Urinary Tract: Adrenal glands are unremarkable. Right nephrectomy with similar soft tissue thickening in the medial surgical bed. Small hyperdense and hypodense lesions in the left kidney are difficult to further characterize due to size and lack of postcontrast imaging, but appear grossly stable. Left ureter is decompressed. Bladder is grossly unremarkable. Stomach/Bowel: Stomach, small bowel, appendix and colon are unremarkable. Vascular/Lymphatic: Atherosclerotic calcification of the aorta. No pathologically enlarged lymph nodes. Reproductive: Prostate is visualized. Other: No free fluid.  Mesenteries and peritoneum are unremarkable. Musculoskeletal: Degenerative changes in the spine. No worrisome lytic or sclerotic lesions. IMPRESSION: 1. Small low and high attenuation lesions in the left kidney are difficult to further characterize due to size and lack of postcontrast imaging but appear stable from 08/24/2023. Recommend continued attention on follow-up. 2. Otherwise, no evidence of recurrent or metastatic disease. 3. Age advanced left anterior descending coronary artery calcification. 4.  Aortic atherosclerosis (ICD10-I70.0). Electronically Signed   By: Newell Eke M.D.   On: 12/17/2023 17:42

## 2024-03-11 NOTE — Assessment & Plan Note (Signed)
 Recommend calcium and vitamin D supplementation.

## 2024-03-11 NOTE — Assessment & Plan Note (Signed)
 Improved. Recommend imodium  PRN as directed

## 2024-03-11 NOTE — Assessment & Plan Note (Signed)
 Treatment plan as listed above.

## 2024-03-11 NOTE — Assessment & Plan Note (Addendum)
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  Recent CT showed thoracic lymphadenopathy which were biopsy proven recurrent metastatic RCC 2nd line treatment with cabozantinib  60mg  daily--> did not tolerate, after 3 weeks chemo break  switched to 40mg  daily on 07/29/2023 --> chemo break from 10/15/23-11/05/23 due to side effects.--> resumed on cabozantinib  40mg  until 12/28/23, stopped due to side effects. --> chemo break, started back on cabozantinib  20mg  daily.  Labs are reviewed and discussed with patient. Continue cabozantinib  to 20 mg daily Repeat CT scan in Sept 2025

## 2024-03-11 NOTE — Progress Notes (Signed)
 Pt in for follow up reports he has been tolerating new med dosage well.  Denies any GI side effects and improved appetite.

## 2024-03-24 ENCOUNTER — Encounter: Payer: Self-pay | Admitting: Pulmonary Disease

## 2024-03-24 ENCOUNTER — Ambulatory Visit: Admitting: Pulmonary Disease

## 2024-03-24 DIAGNOSIS — J4489 Other specified chronic obstructive pulmonary disease: Secondary | ICD-10-CM

## 2024-03-24 DIAGNOSIS — J4540 Moderate persistent asthma, uncomplicated: Secondary | ICD-10-CM | POA: Diagnosis not present

## 2024-03-24 DIAGNOSIS — R911 Solitary pulmonary nodule: Secondary | ICD-10-CM

## 2024-03-24 DIAGNOSIS — Z905 Acquired absence of kidney: Secondary | ICD-10-CM

## 2024-03-24 DIAGNOSIS — R0602 Shortness of breath: Secondary | ICD-10-CM

## 2024-03-24 DIAGNOSIS — Z85528 Personal history of other malignant neoplasm of kidney: Secondary | ICD-10-CM | POA: Diagnosis not present

## 2024-03-24 MED ORDER — ALBUTEROL SULFATE HFA 108 (90 BASE) MCG/ACT IN AERS
2.0000 | INHALATION_SPRAY | Freq: Four times a day (QID) | RESPIRATORY_TRACT | 3 refills | Status: DC | PRN
Start: 2024-03-24 — End: 2024-03-25

## 2024-03-24 MED ORDER — TRELEGY ELLIPTA 100-62.5-25 MCG/ACT IN AEPB: 1.0000 | INHALATION_SPRAY | Freq: Every day | RESPIRATORY_TRACT | 3 refills | Status: AC

## 2024-03-24 NOTE — Progress Notes (Signed)
 Synopsis: Referred in by Matthew Fend, DO   Subjective:   PATIENT ID: Matthew Woodard GENDER: male DOB: 21-Sep-1965, MRN: [id number]  Chief Complaint  Patient presents with   Shortness of Breath    Occasional  SOB. No wheezing. Cough with grey sputum.   Trelegy- Daily helps with his breathing. 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  Since then he has been followed with CT chest abdomen pelvis every 3 months.  He is presenting today to the pulmonary clinic as a referral from Matthew Woodard for follow-up regarding lung nodule seen on recent CT.  His most recent CT chest in November 2024 revealed stable paratracheal and subcarinal adenopathy but this was compared to the CT chest from Aug 2024. Comparing it to the CT chest in Feb 2024 these mediastinal LN are new and significantly increased in size. There is also a new 6mm LUL nodule with ground glass component.   He reports being asymptomatic, even denies any symptoms of shortness of breath, chest pain, chest tightness.   09/27/2023 OV - Started on ANORO during last visit with significant response. Ran out couple of days and chest tightness as well as cough recurred immediately.   09/27/2023 PFTs - With signs of chronic bronchitis and normal DLCO.   Family history - Father with COPD  Social history - Previous smoker. Smoked 1 ppd for 35 years. Quit in 2015. Continues to smoke marijuana regularly.    03/24/2024 - Matthew Woodard is here to follow up on his respiratory status. PFTs with chronic bronchitis suggestive of asthma. Doing well from a respiratory standpoint. Has not been hospitalized. Rarely uses his rescue inhaler. For his RCC he follows with Matthew Woodard and is on Cabometyx . Doing overall well. Repeat scans  in Dec 2025.   ROS All systems were reviewed and are negative except for the above.  Objective:   There were no vitals filed for this visit.    on RA BMI Readings from Last 3 Encounters:  03/11/24 25.94 kg/m  01/08/24 25.42 kg/m  11/26/23 26.43 kg/m   Wt Readings from Last 3 Encounters:  03/11/24 191 lb 4.8 oz (86.8 kg)  01/08/24 187 lb 6.4 oz (85 kg)  11/26/23 194 lb 14.4 oz (88.4 kg)    Physical Exam GEN: NAD HEENT: Supple Neck, Reactive Pupils, EOMI  CVS: Normal S1, Normal S2, RRR, No murmurs or ES appreciated  Lungs: Diffuse wheezing bilaterally  Abdomen: Soft, non tender, non distended, + BS  Extremities: Warm and well perfused, No edema  Skin: No suspicious lesions appreciated  Psych: Normal Affect  Labs and imaging were reviewed Ancillary Information   CBC    Component Value Date/Time   WBC 6.8 03/11/2024 0941   WBC 6.7 08/10/2023 0816   RBC 5.18 03/11/2024 0941   HGB 16.2 03/11/2024 0941   HCT 48.4 03/11/2024 0941   PLT 182 03/11/2024 0941   MCV 93.4 03/11/2024 0941   MCH 31.3 03/11/2024 0941   MCHC 33.5 03/11/2024 0941   RDW 12.9 03/11/2024 0941   LYMPHSABS 2.0 03/11/2024 0941   MONOABS 0.4 03/11/2024 0941   EOSABS 0.1 03/11/2024 0941   BASOSABS 0.0 03/11/2024 0941       Latest Ref Rng & Units 09/27/2023    9:39  AM  PFT Results  FVC-Pre L 4.61   FVC-Predicted Pre % 89   FVC-Post L 4.62   FVC-Predicted Post % 89   Pre FEV1/FVC % % 69   Post FEV1/FCV % % 67   FEV1-Pre L 3.18   FEV1-Predicted Pre % 80   FEV1-Post L 3.11   DLCO uncorrected ml/min/mmHg 31.24   DLCO UNC% % 105   DLCO corrected ml/min/mmHg 30.34   DLCO COR %Predicted % 102   DLVA Predicted % 106   TLC L 7.77   TLC % Predicted % 105   RV % Predicted % 136      Assessment & Plan:  Mr. 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  Since then he has been followed with CT chest abdomen pelvis every 3 months.  He is presenting today to the pulmonary clinic as a referral from Matthew Woodard for follow-up regarding lung nodule seen on recent CT.  #Metastatic RCC involving Mediastinal LN. Currently on Cabozantinib  40mg  Daily.  Repeat CT chest 07/2023 with significant apparent improvement.  []  Follows with Matthew Woodard.   Moderate persistent asthma with some component of COPD (Chronic bronchitis) on recent PFTs  []  c/w Trelegy 100 1 puff daily. Advised on mouth rinsing.  []  c/w Albuterol  2puffs Q6H as needed   RTC 6 months.   I personally spent a total of 30 minutes in the care of the patient today including preparing to see the patient, getting/reviewing separately obtained history, performing a medically appropriate exam/evaluation, counseling and educating, placing orders, documenting clinical information in the EHR, independently interpreting results, and communicating results.   Matthew Barn, MD [facility] 03/24/2024 9:46 AM

## 2024-03-25 ENCOUNTER — Other Ambulatory Visit (HOSPITAL_COMMUNITY): Payer: Self-pay

## 2024-03-25 ENCOUNTER — Other Ambulatory Visit: Payer: Self-pay | Admitting: Pulmonary Disease

## 2024-03-25 DIAGNOSIS — R0602 Shortness of breath: Secondary | ICD-10-CM

## 2024-04-07 ENCOUNTER — Other Ambulatory Visit: Payer: Self-pay

## 2024-04-07 ENCOUNTER — Encounter: Payer: Self-pay | Admitting: Oncology

## 2024-04-07 MED ORDER — ALPRAZOLAM 0.5 MG PO TABS: 0.5000 mg | ORAL_TABLET | Freq: Two times a day (BID) | ORAL | 0 refills | Status: DC | PRN

## 2024-04-09 ENCOUNTER — Other Ambulatory Visit: Payer: Self-pay | Admitting: Oncology

## 2024-04-14 ENCOUNTER — Other Ambulatory Visit: Payer: Self-pay

## 2024-04-14 ENCOUNTER — Encounter: Payer: Self-pay | Admitting: Internal Medicine

## 2024-04-14 ENCOUNTER — Ambulatory Visit: Admitting: Internal Medicine

## 2024-04-14 VITALS — BP 124/82 | HR 97 | Temp 97.8°F | Resp 16 | Ht 72.0 in | Wt 194.6 lb

## 2024-04-14 DIAGNOSIS — K219 Gastro-esophageal reflux disease without esophagitis: Secondary | ICD-10-CM

## 2024-04-14 DIAGNOSIS — E785 Hyperlipidemia, unspecified: Secondary | ICD-10-CM

## 2024-04-14 DIAGNOSIS — E1165 Type 2 diabetes mellitus with hyperglycemia: Secondary | ICD-10-CM

## 2024-04-14 DIAGNOSIS — Z0000 Encounter for general adult medical examination without abnormal findings: Secondary | ICD-10-CM

## 2024-04-14 DIAGNOSIS — Z125 Encounter for screening for malignant neoplasm of prostate: Secondary | ICD-10-CM

## 2024-04-14 DIAGNOSIS — I700 Atherosclerosis of aorta: Secondary | ICD-10-CM

## 2024-04-14 DIAGNOSIS — I10 Essential (primary) hypertension: Secondary | ICD-10-CM

## 2024-04-14 LAB — POCT GLYCOSYLATED HEMOGLOBIN (HGB A1C): Hemoglobin A1C: 6.4 % — AB (ref 4.0–5.6)

## 2024-04-14 LAB — LIPID PANEL
Cholesterol: 132 mg/dL (ref <200)
HDL: 55 mg/dL (ref >=40)
LDL Cholesterol (Calc): 63 mg/dL
Non-HDL Cholesterol (Calc): 77 mg/dL (ref <130)
Total CHOL/HDL Ratio: 2.4 (calc) (ref <5.0)
Triglycerides: 67 mg/dL (ref <150)

## 2024-04-14 LAB — PSA: PSA: 0.59 ng/mL (ref <=4.00)

## 2024-04-14 MED ORDER — ROSUVASTATIN CALCIUM 20 MG PO TABS: 20.0000 mg | ORAL_TABLET | Freq: Every day | ORAL | 1 refills | Status: AC

## 2024-04-14 MED ORDER — SEMAGLUTIDE (1 MG/DOSE) 4 MG/3ML ~~LOC~~ SOPN: 1.0000 mg | PEN_INJECTOR | SUBCUTANEOUS | 2 refills | Status: DC

## 2024-04-14 MED ORDER — EMPAGLIFLOZIN 25 MG PO TABS: 25.0000 mg | ORAL_TABLET | Freq: Every day | ORAL | 1 refills | Status: AC

## 2024-04-14 MED ORDER — LISINOPRIL 20 MG PO TABS: 20.0000 mg | ORAL_TABLET | Freq: Every day | ORAL | 1 refills | Status: AC

## 2024-04-14 MED ORDER — OMEPRAZOLE 20 MG PO CPDR: 20.0000 mg | DELAYED_RELEASE_CAPSULE | Freq: Every day | ORAL | 1 refills | Status: AC

## 2024-04-14 NOTE — Progress Notes (Signed)
 Established Patient Office Visit  Subjective    Patient ID: Matthew Woodard, male    DOB: 08/23/1965  Age: 58 y.o. MRN: [id number]  CC:  Chief Complaint  Patient presents with   Medical Management of Chronic Issues    6 month recheck    HPI Matthew Woodard presents to follow up.   Discussed the use of AI scribe software for clinical note transcription with the patient, who gave verbal consent to proceed.  History of Present Illness  Matthew Woodard is a 58 year old male with diabetes and a history of cancer who presents for a routine follow-up and lab work.  He is here for routine fasting lab work, including A1c and cholesterol. His last cholesterol check was at the end of 2023.  He manages diabetes with Jardiance  and semaglutide  (Ozempic ) and has stopped metformin . His last A1c was 6.4. He reports refill difficulties with Ozempic , which is shipped monthly. He also takes Prilosec, Crestor , and lisinopril  daily.  He is on a reduced dose of his cancer medication due to prior side effects and feels well on the current dose. He is cautious about infection and notes that scratches and sores heal slowly.  He has swelling and decreased range of motion in a knuckle with a palpable knot, and wonders about gout or arthritis. Voltaren  gel did not help. He avoids oral anti-inflammatories because he has one kidney.  He has a scab on his head and again notes delayed healing, which he attributes to being immunocompromised. He plans to see a dermatologist for evaluation.  Hypertension: -Medications: Lisinopril  20 mg  -Patient is compliant with above medications and reports no side effects. -Denies any SOB, CP, vision changes, LE edema or symptoms of hypotension  HLD: -Medications: Crestor  20 mg -Patient is compliant with above medications and reports no side effects.  -Last lipid panel: Lipid Panel     Component Value Date/Time   CHOL 134 04/28/2022 1157   CHOL 157  11/02/2014 0825   CHOL 232 (H) 02/01/2013 0826   TRIG 103 04/28/2022 1157   TRIG 117 02/01/2013 0826   HDL 46 04/28/2022 1157   HDL 50 11/02/2014 0825   HDL 47 02/01/2013 0826   CHOLHDL 2.9 04/28/2022 1157   VLDL 28 01/08/2017 1012   VLDL 23 02/01/2013 0826   LDLCALC 69 04/28/2022 1157   LDLCALC 162 (H) 02/01/2013 0826   LABVLDL 27 11/02/2014 0825    Diabetes, Type 2: -Last A1c 6.8% 5/25 -Medications: Jardiance  25 mg, Ozempic  1 mg weekly, stopped taking Metformin   -Patient is compliant with the above medications and reports no side effects.  -Eye exam: UTD -Foot exam: UTD -Microalbumin: UTD -Statin: yes -PNA vaccine: declines, discussed again -Denies symptoms of hypoglycemia, polyuria, polydipsia, numbness extremities  Renal Cell Carcinoma:  -First diagnosed 06/2018, right kidney removed 07/2018; had spread to bladder and lungs -Following with Oncology Dr. Babara -Recent CT scan 825 showing no evidence of recurrent or metastatic disease -Now on Cabozantinib  20 mg daily and doing better in terms of side effects on the lower dose  GERD/Barrett's Esophagus: -Currently on Prilosec 20 mg daily  Health Maintenance: -Colon cancer screening: colonoscopy 10/2021, repeat in 7 years -Blood work due -Discussed Prevnar 20, patient continues to decline   Outpatient Encounter Medications as of 04/14/2024  Medication Sig   albuterol  (VENTOLIN  HFA) 108 (90 Base) MCG/ACT inhaler Inhale 1-2 puffs into the lungs every 6 (six) hours as needed for wheezing or  shortness of breath.   ALPRAZolam  (XANAX ) 0.5 MG tablet Take 1 tablet (0.5 mg total) by mouth 2 (two) times daily as needed for anxiety or sleep.   CABOMETYX  20 MG tablet TAKE 1 TABLET BY MOUTH 1 TIME A DAY. TAKE ON AN EMPTY STOMACH, 1 HOUR BEFORE OR 2 HOURS AFTER MEALS.   clobetasol  ointment (TEMOVATE ) 0.05 % Apply 1 Application topically 2 (two) times daily as needed.   empagliflozin  (JARDIANCE ) 25 MG TABS tablet TAKE 1 TABLET DAILY BEFORE  BREAKFAST   fluticasone  (FLONASE ) 50 MCG/ACT nasal spray Place 2 sprays into both nostrils daily.   Fluticasone -Umeclidin-Vilant (TRELEGY ELLIPTA ) 100-62.5-25 MCG/ACT AEPB Inhale 1 puff into the lungs daily.   lisinopril  (ZESTRIL ) 20 MG tablet Take 1 tablet (20 mg total) by mouth daily.   omeprazole  (PRILOSEC) 20 MG capsule TAKE 1 CAPSULE DAILY AS    NEEDED FOR ACID REFLUX   ondansetron  (ZOFRAN ) 8 MG tablet Take 1 tablet (8 mg total) by mouth every 8 (eight) hours as needed for nausea or vomiting.   prochlorperazine  (COMPAZINE ) 10 MG tablet Take 1 tablet (10 mg total) by mouth every 6 (six) hours as needed.   rosuvastatin  (CRESTOR ) 20 MG tablet TAKE 1 TABLET DAILY WITH   SUPPER   Semaglutide , 1 MG/DOSE, 4 MG/3ML SOPN Inject 1 mg as directed once a week.   metFORMIN  (GLUCOPHAGE -XR) 500 MG 24 hr tablet Take 2 tablets (1,000 mg total) by mouth 2 (two) times daily with a meal. (Patient not taking: Reported on 03/24/2024)   No facility-administered encounter medications on file as of 04/14/2024.    Past Medical History:  Diagnosis Date   Arthritis    hands, left knee   Cancer (HCC)    COPD (chronic obstructive pulmonary disease) (HCC)    Diabetes mellitus without complication (HCC)    Eczema 01/08/2017   GERD (gastroesophageal reflux disease)    History of kidney cancer    Hyperlipidemia LDL goal <100 11/02/2014   Hypertension    Kidney stone    YEAR H/O HEMATURIA   Psoriasis    Wears dentures    full upper, partial lower.  Has, does not wear    Past Surgical History:  Procedure Laterality Date   BRONCHIAL NEEDLE ASPIRATION BIOPSY  05/03/2023   Procedure: BRONCHIAL NEEDLE ASPIRATION BIOPSIES;  Surgeon: Malka Domino, MD;  Location: [facility];  Service: Pulmonary;;   COLONOSCOPY N/A 10/21/2021   Procedure: COLONOSCOPY;  Surgeon: Jinny Carmine, MD;  Location: [facility];  Service: Endoscopy;  Laterality: N/A;   COLONOSCOPY WITH PROPOFOL  N/A 02/23/2017   Procedure:  COLONOSCOPY WITH PROPOFOL ;  Surgeon: Jinny Carmine, MD;  Location: [facility];  Service: Endoscopy;  Laterality: N/A;  Diabetic - oral meds   CYST EXCISION  12/2014   on back   CYSTOSCOPY W/ RETROGRADES Right 06/28/2018   Procedure: CYSTOSCOPY WITH RETROGRADE PYELOGRAM;  Surgeon: Francisca Redell BROCKS, MD;  Location: [facility];  Service: Urology;  Laterality: Right;   CYSTOSCOPY W/ URETERAL STENT REMOVAL Right 07/22/2018   Procedure: CYSTOSCOPY WITH STENT REMOVAL;  Surgeon: Francisca Redell BROCKS, MD;  Location: [facility];  Service: Urology;  Laterality: Right;   CYSTOSCOPY WITH BIOPSY Right 06/28/2018   Procedure: CYSTOSCOPY WITH RENAL BIOPSY;  Surgeon: Francisca Redell BROCKS, MD;  Location: [facility];  Service: Urology;  Laterality: Right;   CYSTOSCOPY WITH URETEROSCOPY AND STENT PLACEMENT Right 06/28/2018   Procedure: CYSTOSCOPY WITH URETEROSCOPY AND STENT PLACEMENT;  Surgeon: Francisca Redell BROCKS, MD;  Location: [facility];  Service:  Urology;  Laterality: Right;   ESOPHAGOGASTRODUODENOSCOPY N/A 10/21/2021   Procedure: ESOPHAGOGASTRODUODENOSCOPY (EGD);  Surgeon: Jinny Carmine, MD;  Location: [facility];  Service: Endoscopy;  Laterality: N/A;   FULGURATION OF BLADDER TUMOR N/A 06/28/2018   Procedure: CYSTOSCOPY BLADDER BIOPSY, FULGERATION OF BLADDER;  Surgeon: Francisca Redell BROCKS, MD;  Location: [facility];  Service: Urology;  Laterality: N/A;   JOINT REPLACEMENT Left    KNEE SURGERY Left    LAPAROSCOPIC NEPHRECTOMY, HAND ASSISTED Right 07/22/2018   Procedure: HAND ASSISTED LAPAROSCOPIC NEPHRECTOMY;  Surgeon: Francisca Redell BROCKS, MD;  Location: [facility];  Service: Urology;  Laterality: Right;   NASAL SINUS SURGERY  07/2013   POLYPECTOMY N/A 02/23/2017   Procedure: POLYPECTOMY;  Surgeon: Jinny Carmine, MD;  Location: [facility];  Service: Endoscopy;  Laterality: N/A;   POLYPECTOMY  10/21/2021   Procedure: POLYPECTOMY;  Surgeon: Jinny Carmine, MD;  Location: [facility];  Service: Endoscopy;;    REPLACEMENT TOTAL KNEE Right    tooth abstraction     TRANSURETHRAL RESECTION OF BLADDER TUMOR N/A 05/12/2019   Procedure: TRANSURETHRAL RESECTION OF BLADDER TUMOR (TURBT);  Surgeon: Francisca Redell BROCKS, MD;  Location: [facility];  Service: Urology;  Laterality: N/A;   VARICOCELE EXCISION     VIDEO BRONCHOSCOPY WITH ENDOBRONCHIAL ULTRASOUND N/A 05/03/2023   Procedure: VIDEO BRONCHOSCOPY WITH ENDOBRONCHIAL ULTRASOUND;  Surgeon: Malka Domino, MD;  Location: [facility];  Service: Pulmonary;  Laterality: N/A;   XI ROBOTIC ASSISTED VENTRAL HERNIA N/A 11/18/2020   Procedure: XI ROBOTIC ASSISTED VENTRAL HERNIA, incisional;  Surgeon: Jordis Laneta FALCON, MD;  Location: [facility];  Service: General;  Laterality: N/A;    Family History  Problem Relation Age of Onset   Heart disease Father    Hypertension Father    COPD Father    Colon cancer Maternal Grandmother    Heart attack Paternal Grandfather     Social History   Socioeconomic History   Marital status: Married    Spouse name: Jinnie   Number of children: Not on file   Years of education: Not on file   Highest education level: Not on file  Occupational History   Not on file  Tobacco Use   Smoking status: Former    Current packs/day: 0.00    Types: Cigarettes    Quit date: 06/15/2013    Years since quitting: 10.8   Smokeless tobacco: Never  Vaping Use   Vaping status: Never Used  Substance and Sexual Activity   Alcohol use: No    Alcohol/week: 0.0 standard drinks of alcohol   Drug use: Yes    Types: Marijuana   Sexual activity: Yes  Other Topics Concern   Not on file  Social History Narrative   Lives with wife, sister in law   Social Drivers of Health   Financial Resource Strain: Low Risk (09/13/2021)   Received [facility]   Overall Financial Resource Strain (CARDIA)    Difficulty of Paying Living Expenses: Not hard at all  Food Insecurity: No Food Insecurity (09/13/2021)   Received from [facility]   Hunger  Vital Sign    Within the past 12 months, you worried that your food would run out before you got the money to buy more.: Never true    Within the past 12 months, the food you bought just didn't last and you didn't have money to get more.: Never true  Transportation Needs: No Transportation Needs (09/13/2021)   [facility]  PRAPARE - Administrator, Civil Service (Medical): No    Lack of Transportation (Non-Medical): No  Physical Activity: Not on file  Stress: Not on file  Social Connections: Not on file  Intimate Partner Violence: Not on file    Review of Systems  Musculoskeletal:  Positive for joint pain.  All other systems reviewed and are negative.       Objective    BP 124/82 (Cuff Size: Large)   Pulse 97   Temp 97.8 F (36.6 C) (Oral)   Resp 16   Ht 6' (1.829 m)   Wt 194 lb 9.6 oz (88.3 kg)   SpO2 99%   BMI 26.39 kg/m   Physical Exam Constitutional:      Appearance: Normal appearance.  HENT:     Head: Normocephalic and atraumatic.  Eyes:     Conjunctiva/sclera: Conjunctivae normal.  Cardiovascular:     Rate and Rhythm: Normal rate and regular rhythm.     Pulses:          Dorsalis pedis pulses are 2+ on the right side and 2+ on the left side.  Pulmonary:     Effort: Pulmonary effort is normal.     Breath sounds: Normal breath sounds.  Musculoskeletal:     Right lower leg: No edema.     Left lower leg: No edema.     Right foot: Normal range of motion. No deformity, bunion, Charcot foot, foot drop or prominent metatarsal heads.     Left foot: Normal range of motion. No deformity, bunion, Charcot foot, foot drop or prominent metatarsal heads.     Comments: Third MCP on the right hand swollen but no erythema, warmth, etc.   Feet:     Right foot:     Protective Sensation: 6 sites tested.  6 sites sensed.     Skin integrity: Skin integrity normal.     Toenail Condition: Right toenails are normal.     Left foot:     Protective  Sensation: 6 sites tested.  6 sites sensed.     Skin integrity: Skin integrity normal.     Toenail Condition: Left toenails are normal.  Skin:    General: Skin is warm and dry.     Comments: Scab on head  Neurological:     General: No focal deficit present.     Mental Status: He is alert. Mental status is at baseline.  Psychiatric:        Mood and Affect: Mood normal.        Behavior: Behavior normal.         Assessment & Plan:   Assessment & Plan  Type 2 diabetes mellitus Well-controlled with A1c of 6.4. Jardiance  provides renal and cardiac protection. Ozempic  likely maintains glycemic control. - Continue Jardiance  and Ozempic . - Discontinued metformin . - Refilled Ozempic  with six months' supply and two refills.  Hyperlipidemia Cholesterol levels need updating. - Ordered cholesterol panel while he is fasting.  - Continue statin.   Essential hypertension Blood pressure well-controlled with current regimen. - Continue current antihypertensive medications.  Gastroesophageal reflux disease Managed with daily omeprazole , no issues reported. - Refilled omeprazole  prescription.  Osteoarthritis of hand Swelling and difficulty bending knuckle, likely osteoarthritis. Differential includes gout, but presentation suggests arthritis. Caution with anti-inflammatories due to single kidney. - Referred to [facility] for evaluation and possible injection. - Use Tylenol  for pain management. - Consider heat application and Voltaren  gel for symptomatic relief.  General Health Maintenance Immunocompromised  due to cancer treatment, increasing infection risk. Discussed vaccination importance, especially pneumonia vaccine. - Recommended pneumonia vaccine. - Ordered PSA test for prostate cancer screening.  - POCT HgB A1C - Lipid Profile - Semaglutide , 1 MG/DOSE, 4 MG/3ML SOPN; Inject 1 mg as directed once a week.  Dispense: 6 mL; Refill: 2 - empagliflozin  (JARDIANCE ) 25 MG TABS  tablet; Take 1 tablet (25 mg total) by mouth daily before breakfast.  Dispense: 90 tablet; Refill: 1 - Lipid Profile - rosuvastatin  (CRESTOR ) 20 MG tablet; Take 1 tablet (20 mg total) by mouth daily.  Dispense: 90 tablet; Refill: 1 - omeprazole  (PRILOSEC) 20 MG capsule; Take 1 capsule (20 mg total) by mouth daily.  Dispense: 90 capsule; Refill: 1 - lisinopril  (ZESTRIL ) 20 MG tablet; Take 1 tablet (20 mg total) by mouth daily.  Dispense: 90 tablet; Refill: 1 - PSA  Return in about 6 months (around 10/13/2024).   Sharyle Fischer, DO

## 2024-04-15 ENCOUNTER — Ambulatory Visit: Payer: Self-pay | Admitting: Internal Medicine

## 2024-04-17 ENCOUNTER — Telehealth: Payer: Self-pay | Admitting: Oncology

## 2024-04-17 NOTE — Telephone Encounter (Signed)
 Called pt to confirm date/time/location still work for CT - pt confirmed - LH

## 2024-04-23 ENCOUNTER — Other Ambulatory Visit: Payer: Self-pay | Admitting: Internal Medicine

## 2024-04-23 DIAGNOSIS — E1165 Type 2 diabetes mellitus with hyperglycemia: Secondary | ICD-10-CM

## 2024-04-24 ENCOUNTER — Ambulatory Visit: Admission: RE | Admit: 2024-04-24 | Discharge: 2024-04-24 | Attending: Oncology

## 2024-04-24 DIAGNOSIS — C641 Malignant neoplasm of right kidney, except renal pelvis: Secondary | ICD-10-CM | POA: Diagnosis present

## 2024-04-24 NOTE — Telephone Encounter (Signed)
 Requested medication (s) are due for refill today: yes  Requested medication (s) are on the active medication list: yes  Last refill:  04/14/24  Future visit scheduled: yes  Notes to clinic:    Pharmacy comment: Your patients plan expects three month supplies. May we adjust the quantity to a three month supply with 3 refills or we will need to reduce to a one month supply.  Respond with Quantity and Days Supply. Your patients plan expects three month supplies. May we adjust the quantity to a three month supply with 3 refills or we will need to reduce to a one month supply.  Respond with Quantity and Days Supply.       Requested Prescriptions  Pending Prescriptions Disp Refills   OZEMPIC , 1 MG/DOSE, 4 MG/3ML SOPN [Pharmacy Med Name: OZEMPIC  1MG  INJ 4MG /3ML]  3    Sig: INJECT 1MG  SUBCUTANEOUSLY  ONCE A WEEK AS DIRECTED     Endocrinology:  Diabetes - GLP-1 Receptor Agonists - semaglutide  Failed - 04/24/2024  4:25 PM      Failed - HBA1C in normal range and within 180 days    Hemoglobin A1C  Date Value Ref Range Status  04/14/2024 6.4 (A) 4.0 - 5.6 % Final  02/01/2013 10.8 (H) 4.2 - 6.3 % Final    Comment:    The American Diabetes Association recommends that a primary goal of therapy should be <7% and that physicians should reevaluate the treatment regimen in patients with HbA1c values consistently >8%.    Hgb A1c MFr Bld  Date Value Ref Range Status  04/28/2022 7.4 (H) <5.7 % of total Hgb Final    Comment:    For someone without known diabetes, a hemoglobin A1c value of 6.5% or greater indicates that they may have  diabetes and this should be confirmed with a follow-up  test. . For someone with known diabetes, a value <7% indicates  that their diabetes is well controlled and a value  greater than or equal to 7% indicates suboptimal  control. A1c targets should be individualized based on  duration of diabetes, age, comorbid conditions, and  other  considerations. . Currently, no consensus exists regarding use of hemoglobin A1c for diagnosis of diabetes for children. .          Passed - Cr in normal range and within 360 days    Creatinine  Date Value Ref Range Status  03/11/2024 1.02 0.61 - 1.24 mg/dL Final   Creat  Date Value Ref Range Status  08/05/2018 1.04 0.70 - 1.33 mg/dL Final    Comment:    For patients >60 years of age, the reference limit for Creatinine is approximately 13% higher for people identified as African-American. .    Creatinine, Urine  Date Value Ref Range Status  07/11/2023 107 20 - 320 mg/dL Final         Passed - Valid encounter within last 6 months    Recent Outpatient Visits           1 week ago Annual physical exam   [facility] Bernardo Fend, DO   6 months ago Type 2 diabetes mellitus with hyperglycemia, without long-term current use of insulin  [facility])   [facility] Bernardo Fend, DO   9 months ago Type 2 diabetes mellitus with hyperglycemia, without long-term current use of insulin  [facility])   [facility] Bernardo Fend, [place]

## 2024-05-01 ENCOUNTER — Inpatient Hospital Stay: Attending: Oncology

## 2024-05-01 ENCOUNTER — Encounter: Payer: Self-pay | Admitting: Oncology

## 2024-05-01 ENCOUNTER — Inpatient Hospital Stay: Admitting: Oncology

## 2024-05-01 VITALS — BP 156/92 | HR 98 | Temp 97.6°F | Resp 17 | Wt 202.1 lb

## 2024-05-01 DIAGNOSIS — Z905 Acquired absence of kidney: Secondary | ICD-10-CM | POA: Insufficient documentation

## 2024-05-01 DIAGNOSIS — C7911 Secondary malignant neoplasm of bladder: Secondary | ICD-10-CM | POA: Insufficient documentation

## 2024-05-01 DIAGNOSIS — K521 Toxic gastroenteritis and colitis: Secondary | ICD-10-CM

## 2024-05-01 DIAGNOSIS — T451X5A Adverse effect of antineoplastic and immunosuppressive drugs, initial encounter: Secondary | ICD-10-CM | POA: Diagnosis not present

## 2024-05-01 DIAGNOSIS — L271 Localized skin eruption due to drugs and medicaments taken internally: Secondary | ICD-10-CM

## 2024-05-01 DIAGNOSIS — C7801 Secondary malignant neoplasm of right lung: Secondary | ICD-10-CM | POA: Insufficient documentation

## 2024-05-01 DIAGNOSIS — Z5111 Encounter for antineoplastic chemotherapy: Secondary | ICD-10-CM

## 2024-05-01 DIAGNOSIS — C771 Secondary and unspecified malignant neoplasm of intrathoracic lymph nodes: Secondary | ICD-10-CM | POA: Diagnosis present

## 2024-05-01 DIAGNOSIS — C641 Malignant neoplasm of right kidney, except renal pelvis: Secondary | ICD-10-CM | POA: Diagnosis not present

## 2024-05-01 DIAGNOSIS — Z87891 Personal history of nicotine dependence: Secondary | ICD-10-CM | POA: Insufficient documentation

## 2024-05-01 DIAGNOSIS — C7802 Secondary malignant neoplasm of left lung: Secondary | ICD-10-CM | POA: Diagnosis not present

## 2024-05-01 DIAGNOSIS — Z9226 Personal history of immune checkpoint inhibitor therapy: Secondary | ICD-10-CM | POA: Insufficient documentation

## 2024-05-01 LAB — CMP (CANCER CENTER ONLY)
ALT: 35 U/L (ref 0–44)
AST: 25 U/L (ref 15–41)
Albumin: 3.9 g/dL (ref 3.5–5.0)
Alkaline Phosphatase: 80 U/L (ref 38–126)
Anion gap: 10 (ref 5–15)
BUN: 20 mg/dL (ref 6–20)
CO2: 28 mmol/L (ref 22–32)
Calcium: 9.8 mg/dL (ref 8.9–10.3)
Chloride: 99 mmol/L (ref 98–111)
Creatinine: 1 mg/dL (ref 0.61–1.24)
GFR, Estimated: >60 mL/min (ref >60)
Glucose, Bld: 196 mg/dL — ABNORMAL HIGH (ref 70–99)
Potassium: 4.1 mmol/L (ref 3.5–5.1)
Sodium: 137 mmol/L (ref 135–145)
Total Bilirubin: 0.4 mg/dL (ref 0.0–1.2)
Total Protein: 6.3 g/dL — ABNORMAL LOW (ref 6.5–8.1)

## 2024-05-01 LAB — CBC WITH DIFFERENTIAL (CANCER CENTER ONLY)
Abs Immature Granulocytes: 0.21 K/uL — ABNORMAL HIGH (ref 0.00–0.07)
Basophils Absolute: 0.1 K/uL (ref 0.0–0.1)
Basophils Relative: 1 %
Eosinophils Absolute: 0.2 K/uL (ref 0.0–0.5)
Eosinophils Relative: 2 %
HCT: 45.6 % (ref 39.0–52.0)
Hemoglobin: 15.7 g/dL (ref 13.0–17.0)
Immature Granulocytes: 2 %
Lymphocytes Relative: 22 %
Lymphs Abs: 2.2 K/uL (ref 0.7–4.0)
MCH: 32.1 pg (ref 26.0–34.0)
MCHC: 34.4 g/dL (ref 30.0–36.0)
MCV: 93.3 fL (ref 80.0–100.0)
Monocytes Absolute: 0.6 K/uL (ref 0.1–1.0)
Monocytes Relative: 6 %
Neutro Abs: 7 K/uL (ref 1.7–7.7)
Neutrophils Relative %: 67 %
Platelet Count: 172 K/uL (ref 150–400)
RBC: 4.89 MIL/uL (ref 4.22–5.81)
RDW: 13.5 % (ref 11.5–15.5)
WBC Count: 10.3 K/uL (ref 4.0–10.5)
nRBC: 0 % (ref 0.0–0.2)

## 2024-05-01 LAB — LACTATE DEHYDROGENASE: LDH: 242 U/L — ABNORMAL HIGH (ref 105–235)

## 2024-05-01 NOTE — Assessment & Plan Note (Signed)
 Treatment plan as listed above.

## 2024-05-01 NOTE — Assessment & Plan Note (Signed)
 Recommend calcium and vitamin D supplementation.

## 2024-05-01 NOTE — Assessment & Plan Note (Addendum)
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  Recent CT showed thoracic lymphadenopathy which were biopsy proven recurrent metastatic RCC 2nd line treatment with cabozantinib  60mg  daily--> did not tolerate, after 3 weeks chemo break  switched to 40mg  daily on 07/29/2023 --> chemo break from 10/15/23-11/05/23 due to side effects.--> resumed on cabozantinib  40mg  until 12/28/23, stopped due to side effects. --> chemo break, started back on cabozantinib  20mg  daily.  Labs are reviewed and discussed with patient. Continue cabozantinib  to 20 mg daily Repeat CT scan in Dec 2025 showed no progression. LDH is elevated probably due to recent arthritis flare. Monitor.

## 2024-05-01 NOTE — Progress Notes (Signed)
 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  Recent CT showed thoracic lymphadenopathy which were biopsy proven recurrent metastatic RCC 2nd line treatment with cabozantinib  60mg  daily--> did not tolerate, after 3 weeks chemo break  switched to 40mg  daily on 07/29/2023 --> chemo break from 10/15/23-11/05/23 due to side effects.--> resumed on cabozantinib  40mg  until 12/28/23, stopped due to side effects. --> chemo break, started back on cabozantinib  20mg  daily.  Labs are reviewed and discussed with patient. Continue cabozantinib  to 20 mg daily Repeat CT scan in Dec 2025 showed no progression. LDH is elevated probably due to recent arthritis flare. Monitor.    Chemotherapy induced diarrhea Improved  Recommend imodium  PRN as directed.   Encounter for antineoplastic chemotherapy Treatment plan as listed above.   Hypocalcemia Recommend calcium  and vitamin D  supplementation.  Palmar plantar erythrodysesthesia Currently symptoms are resolved.  Continue emollient creams or lotions containing urea topically Recommend trials of Voltaren  topical twice daily PRN   Orders Placed This Encounter  Procedures   CBC with Differential (Cancer Center Only)    Standing Status:   Future    Expected Date:   07/30/2024    Expiration Date:   10/28/2024   CMP (Cancer Center only)    Standing Status:   Future    Expected Date:   07/30/2024    Expiration Date:   10/28/2024   Lactate dehydrogenase    Standing Status:   Future    Expected Date:   07/30/2024    Expiration Date:   10/28/2024   Follow up:  3 months All questions were answered. The patient knows to call the clinic with any problems, questions or concerns.  Zelphia Cap, MD, PhD [facility] Hematology Oncology 05/01/2024     HISTORY OF PRESENTING ILLNESS:   Matthew Woodard is a  58 y.o.  male presents for follow up for metastatic kidney cancer Oncology History  Metastatic renal cell carcinoma (HCC)  06/2018 Imaging   CT urogram done which showed right 4 cm central enhancing renal mass with invasion into the collecting system. X-ray of chest was performed to see complete staging which showed no concerning findings   06/28/2018 Cancer Diagnosis      06/28/2018 Procedure    patient underwent a cystoscopy, bladder biopsy and a right retrograde pyelogram with intraoperative interpretation, right diagnostic ureteroscopy, right renal pelvis biopsy, right ureteral stent placement.  biopsy showed atypical cell clusters, with extensive crush artifact, nondiagnostic.     07/22/2018 Initial Diagnosis   Patient underwent right radical laparoscopic nephrectomy with biopsy showing 5.3 cm RCC, clear cell type, grade 3, tumor invades renal vein and segmental branches, pelvic calyceal system and perirenal sinus/fat.  Surgical margins negative for tumor.pT3a,Nx Post-operation, patient was on surveillance after surgery.   10/28/2018 Imaging   CT abdomen pelvis with contrast showed minimal fluid and or postoperative changes within the right renal fossa.  No evidence of metastatic disease or recurrence.  Patient has left kidney lesion previously characterized as nonenhancing cyst by multi phasic contrast-enhanced CT.   04/30/2019 Progression   Patient reports an episode of gross hematuria in early December 2020, patient is due for 44-month surveillance CT scan. 04/30/2019 CT abdomen pelvis with contrast showed high attenuation mass at the right uretero vesicle junction,  with extension into the bladder.  Bilateral pulmonary nodules, most indicated for metastatic disease.  Patient underwent cystoscopy and transurethral resection of irregular bladder tumor 3 cm.  Mass appeared  to emanate from the right ureteral orifice.  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  #NGS: [employer] PD L1 TPS 1%   05/28/2019 Imaging   CT chest w contrast 1. Numerous bilateral pulmonary nodules measuring up to 10 mm diameter. Imaging features compatible with metastatic disease. 2. Left hilar lymphadenopathy, also highly suspicious for metastatic involvement.    05/28/2019 Imaging   Bone scan showed  No scintigraphic evidence of osseous metastatic disease.    06/05/2019 - 06/21/2021 Chemotherapy   06/05/19 Axitinib  5mg  BID+ Keytruda   09/2019 off axitinib  and Keytruda  since the beginning of May due to transaminitis. GI work up negative.  Ultrasound liver vascular Doppler is negative #11/24/2019, resumed on Keytruda  every 3 weeks #01/05/2020, axitinib  was resumed 3 mg #01/20/2020, axitinib  was discontinued due to recurrence of transaminitis.  Keytruda  was temporarily held. #03/16/2020-06/21/2021 resumed on Keytruda  every 3 weeks Keytruda  was discontinued due to recurrent immunotherapy induced diarrhea    07/25/2019 Imaging   Brain w wo contrast No mass, hemorrhage, or abnormal enhancement.   09/29/2020 Imaging   CT scan showed no evidence of local recurrence or new/progressive disease in the chest abdomen or pelvis.  Tiny nonspecific lung nodules are unchanged.  No new lung nodules   01/14/2021 Imaging   CT chest abdomen pelvis showed stable exam, no new or progressive metastatic disease within chest abdomen pelvis. Stable lung nodules. Aortic atherosclerosis.    04/15/2021 Imaging   CT chest abdomen pelvis with contrast showed stable examination.  No evidence of recurrent or metastatic carcinoma within the chest abdomen pelvis.     08/02/2021 Imaging   CT chest abdomen pelvis without contrast, stable examination without evidence of recurrence or metastatic disease.  Mild asymmetric diffuse esophageal wall thickening, similar to prior examination.   Likely chronic esophagitis.  Aortic atherosclerosis   10/21/2021 Procedure   colonoscopy showed 3 mm polyp which was resected and retrieved.  Negative for dysplasia/malignancy.  Random biopsy showed lymphocytic colitis. EGD showed long segment Barrett's esophagus.  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  Negative for dysplasia and malignancy.  Small bowel cold biopsy showed reactive duodenitis.  Negative for CMV.  Negative for dysplasia or malignancy.   11/16/2021 Imaging   CT chest abdomen pelvis without contrast showed stable examination without evidence of local recurrence or active malignant disease in the chest abdomen pelvis.  Reflux retained contrast in the esophagus suggestive of esophagitis.  Aortic atherosclerosis   07/03/2022 Imaging   CT chest abdomen pelvis w contrast   1. Stable examination status post right nephrectomy without evidence of new or progressive disease in the chest, abdomen or pelvis. 2. Stable scattered tiny pulmonary nodules favored benign etiology. 3.  Aortic Atherosclerosis    01/04/2023 Imaging   CT chest abdomen pelvis wo contrast showed  1. New clustered nodularity and ground-glass in the peripheral right lower lobe, likely infectious/inflammatory. Additionally there are new enlarged mediastinal lymph nodes which given the above findings are favored reactive but nonspecific. Suggest attention on short-term interval follow-up dedicated chest CT to ensure resolution. 2. Stable scattered tiny pulmonary nodules. 3. No evidence of local recurrence or metastatic disease in the abdomen or pelvis. 4.  Aortic Atherosclerosis   04/09/2023 Imaging   CT chest wo contrast  1. Persistent right paratracheal and  subcarinal adenopathy. These are stable to slightly increased in the interval (on the order of 1-2 mm). 2. Previously noted left upper lobe and right upper lobe lung nodules are not significantly changed in the  interval. 3. New part solid nodule within the anterior left upper lobe measures 6 mm. The appearance is nonspecific. This may be postinflammatory or infectious in etiology. Consider short-term interval follow-up with repeat CT of the chest in 3 months to ensure resolution. 4. Stable appearance of focal area of ground-glass attenuation and interstitial prominence within the periphery of the right upper lobe. Favor sequelae of inflammation or infection 5. Coronary artery calcifications. 6.  Aortic Atherosclerosis    05/03/2023 Relapse/Recurrence   Biopsy via bronchoscopy  A. LYMPH NODE, 4R, FINE NEEDLE ASPIRATION:  - Carcinoma, consistent with renal cell carcinoma  - No definitive lymphoid tissue identified   B. LYMPH NODE, 7, FINE NEEDLE ASPIRATION:  - Carcinoma, consistent with renal cell carcinoma  - Lymphoid tissue identified   C. LYMPH NODE, 11R, FINE NEEDLE ASPIRATION:  - Carcinoma, consistent with renal cell carcinoma  - Lymphoid tissue identified    05/30/2023 - 07/09/2023 Chemotherapy   Cabozantinib  60mg  daily, stopped due to mucositis, palmar plantar erythrodysesthesia   07/29/2023 -  Chemotherapy   Cabozantinib  40mg  daily   12/17/2023 Imaging   CT chest abdomen pelvis  1. Small low and high attenuation lesions in the left kidney are difficult to further characterize due to size and lack of postcontrast imaging but appear stable from 08/24/2023. Recommend continued attention on follow-up. 2. Otherwise, no evidence of recurrent or metastatic disease. 3. Age advanced left anterior descending coronary artery calcification. 4.  Aortic atherosclerosis (ICD10-I70.0)   Clear cell carcinoma of right kidney (HCC)     #History of cutaneous psoriasis, mostly on his right hand,    INTERVAL HISTORY Matthew Woodard is a 58 y.o. male who has above history reviewed by me today presents for follow up visit for management of metastatic RCC. Patient reports doing  well. Restarted on cabozantinib  20mg  daily.  reports he has been tolerating new med dosage well. Denies any GI side effects and improved appetite.  He recently completed a two-week course of high-dose oral steroids for arthritis and a bone spur, prescribed by an outside provider. During steroid therapy, he noted increased appetite and an eight-pound weight gain, which he attributes to the steroids.     Review of Systems  Constitutional:  Positive for fatigue. Negative for appetite change, chills, fever and unexpected weight change.  HENT:   Negative for hearing loss and voice change.   Eyes:  Negative for eye problems and icterus.  Respiratory:  Negative for chest tightness, cough and shortness of breath.   Cardiovascular:  Negative for chest pain and leg swelling.  Gastrointestinal:  Negative for abdominal distention, abdominal pain, blood in stool, diarrhea and nausea.  Endocrine: Negative for hot flashes.  Genitourinary:  Negative for difficulty urinating, dysuria and frequency.   Musculoskeletal:        Bilateral knee replacement.   Skin:  Negative for itching and rash.  Neurological:  Negative for extremity weakness, headaches, light-headedness and numbness.  Hematological:  Negative for adenopathy. Does not bruise/bleed easily.  Psychiatric/Behavioral:  Negative for confusion.     MEDICAL HISTORY:  Past Medical History:  Diagnosis Date   Arthritis    hands, left knee   Cancer (HCC)    COPD (chronic obstructive pulmonary disease) (HCC)    Diabetes mellitus without complication (HCC)  Eczema 01/08/2017   GERD (gastroesophageal reflux disease)    History of kidney cancer    Hyperlipidemia LDL goal <100 11/02/2014   Hypertension    Kidney stone    YEAR H/O HEMATURIA   Psoriasis    Wears dentures    full upper, partial lower.  Has, does not wear    SURGICAL HISTORY: Past Surgical History:  Procedure Laterality Date   BRONCHIAL NEEDLE ASPIRATION BIOPSY  05/03/2023    Procedure: BRONCHIAL NEEDLE ASPIRATION BIOPSIES;  Surgeon: Malka Domino, MD;  Location: [facility];  Service: Pulmonary;;   COLONOSCOPY N/A 10/21/2021   Procedure: COLONOSCOPY;  Surgeon: Jinny Carmine, MD;  Location: [facility];  Service: Endoscopy;  Laterality: N/A;   COLONOSCOPY WITH PROPOFOL  N/A 02/23/2017   Procedure: COLONOSCOPY WITH PROPOFOL ;  Surgeon: Jinny Carmine, MD;  Location: [facility];  Service: Endoscopy;  Laterality: N/A;  Diabetic - oral meds   CYST EXCISION  12/2014   on back   CYSTOSCOPY W/ RETROGRADES Right 06/28/2018   Procedure: CYSTOSCOPY WITH RETROGRADE PYELOGRAM;  Surgeon: Francisca Redell BROCKS, MD;  Location: [facility];  Service: Urology;  Laterality: Right;   CYSTOSCOPY W/ URETERAL STENT REMOVAL Right 07/22/2018   Procedure: CYSTOSCOPY WITH STENT REMOVAL;  Surgeon: Francisca Redell BROCKS, MD;  Location: [facility];  Service: Urology;  Laterality: Right;   CYSTOSCOPY WITH BIOPSY Right 06/28/2018   Procedure: CYSTOSCOPY WITH RENAL BIOPSY;  Surgeon: Francisca Redell BROCKS, MD;  Location: [facility];  Service: Urology;  Laterality: Right;   CYSTOSCOPY WITH URETEROSCOPY AND STENT PLACEMENT Right 06/28/2018   Procedure: CYSTOSCOPY WITH URETEROSCOPY AND STENT PLACEMENT;  Surgeon: Francisca Redell BROCKS, MD;  Location: [facility];  Service: Urology;  Laterality: Right;   ESOPHAGOGASTRODUODENOSCOPY N/A 10/21/2021   Procedure: ESOPHAGOGASTRODUODENOSCOPY (EGD);  Surgeon: Jinny Carmine, MD;  Location: [facility];  Service: Endoscopy;  Laterality: N/A;   FULGURATION OF BLADDER TUMOR N/A 06/28/2018   Procedure: CYSTOSCOPY BLADDER BIOPSY, FULGERATION OF BLADDER;  Surgeon: Francisca Redell BROCKS, MD;  Location: [facility];  Service: Urology;  Laterality: N/A;   JOINT REPLACEMENT Left    KNEE SURGERY Left    LAPAROSCOPIC NEPHRECTOMY, HAND ASSISTED Right 07/22/2018   Procedure: HAND ASSISTED LAPAROSCOPIC NEPHRECTOMY;  Surgeon: Francisca Redell BROCKS, MD;  Location: [facility];  Service: Urology;   Laterality: Right;   NASAL SINUS SURGERY  07/2013   POLYPECTOMY N/A 02/23/2017   Procedure: POLYPECTOMY;  Surgeon: Jinny Carmine, MD;  Location: [facility];  Service: Endoscopy;  Laterality: N/A;   POLYPECTOMY  10/21/2021   Procedure: POLYPECTOMY;  Surgeon: Jinny Carmine, MD;  Location: [facility];  Service: Endoscopy;;   REPLACEMENT TOTAL KNEE Right    tooth abstraction     TRANSURETHRAL RESECTION OF BLADDER TUMOR N/A 05/12/2019   Procedure: TRANSURETHRAL RESECTION OF BLADDER TUMOR (TURBT);  Surgeon: Francisca Redell BROCKS, MD;  Location: [facility];  Service: Urology;  Laterality: N/A;   VARICOCELE EXCISION     VIDEO BRONCHOSCOPY WITH ENDOBRONCHIAL ULTRASOUND N/A 05/03/2023   Procedure: VIDEO BRONCHOSCOPY WITH ENDOBRONCHIAL ULTRASOUND;  Surgeon: Malka Domino, MD;  Location: [facility];  Service: Pulmonary;  Laterality: N/A;   XI ROBOTIC ASSISTED VENTRAL HERNIA N/A 11/18/2020   Procedure: XI ROBOTIC ASSISTED VENTRAL HERNIA, incisional;  Surgeon: Jordis Laneta FALCON, MD;  Location: [facility];  Service: General;  Laterality: N/A;    SOCIAL HISTORY: Social History   Socioeconomic History   Marital status: Married    Spouse name: Jinnie   Number of children: Not on file  Years of education: Not on file   Highest education level: Not on file  Occupational History   Not on file  Tobacco Use   Smoking status: Former    Current packs/day: 0.00    Types: Cigarettes    Quit date: 06/15/2013    Years since quitting: 10.8   Smokeless tobacco: Never  Vaping Use   Vaping status: Never Used  Substance and Sexual Activity   Alcohol use: No    Alcohol/week: 0.0 standard drinks of alcohol   Drug use: Yes    Types: Marijuana   Sexual activity: Yes  Other Topics Concern   Not on file  Social History Narrative   Lives with wife, sister in law   Social Drivers of Health   Tobacco Use: Medium Risk (05/01/2024)   Patient History    Smoking Tobacco Use: Former    Smokeless  Tobacco Use: Never    Passive Exposure: Not on Actuary Strain: Low Risk (09/13/2021)   Received from [facility]   Overall Financial Resource Strain (CARDIA)    Difficulty of Paying Living Expenses: Not hard at all  Food Insecurity: No Food Insecurity (09/13/2021)   Received from [facility]   Epic    Within the past 12 months, you worried that your food would run out before you got the money to buy more.: Never true    Within the past 12 months, the food you bought just didn't last and you didn't have money to get more.: Never true  Transportation Needs: No Transportation Needs (09/13/2021)   Received from [facility]   PRAPARE - Transportation    Lack of Transportation (Medical): No    Lack of Transportation (Non-Medical): No  Physical Activity: Not on file  Stress: Not on file  Social Connections: Not on file  Intimate Partner Violence: Not on file  Depression (PHQ2-9): Low Risk (03/11/2024)   Depression (PHQ2-9)    PHQ-2 Score: 0  Alcohol Screen: Low Risk (07/11/2023)   Alcohol Screen    Last Alcohol Screening Score (AUDIT): 0  Housing: Not on file  Utilities: Not on file  Health Literacy: Not on file    FAMILY HISTORY: Family History  Problem Relation Age of Onset   Heart disease Father    Hypertension Father    COPD Father    Colon cancer Maternal Grandmother    Heart attack Paternal Grandfather     ALLERGIES:  is allergic to axitinib , propofol , and glucotrol [glipizide].  MEDICATIONS:  Current Outpatient Medications  Medication Sig Dispense Refill   albuterol  (VENTOLIN  HFA) 108 (90 Base) MCG/ACT inhaler Inhale 1-2 puffs into the lungs every 6 (six) hours as needed for wheezing or shortness of breath. 3 each 3   ALPRAZolam  (XANAX ) 0.5 MG tablet Take 1 tablet (0.5 mg total) by mouth 2 (two) times daily as needed for anxiety or sleep. 60 tablet 0   CABOMETYX  20 MG tablet TAKE 1 TABLET BY MOUTH 1 TIME A DAY. TAKE ON AN EMPTY STOMACH, 1 HOUR  BEFORE OR 2 HOURS AFTER MEALS. 30 tablet 2   clobetasol  ointment (TEMOVATE ) 0.05 % Apply 1 Application topically 2 (two) times daily as needed. 30 g 0   empagliflozin  (JARDIANCE ) 25 MG TABS tablet Take 1 tablet (25 mg total) by mouth daily before breakfast. 90 tablet 1   fluticasone  (FLONASE ) 50 MCG/ACT nasal spray Place 2 sprays into both nostrils daily. 16 g 1   Fluticasone -Umeclidin-Vilant (TRELEGY ELLIPTA ) 100-62.5-25 MCG/ACT AEPB  Inhale 1 puff into the lungs daily. 3 each 3   lisinopril  (ZESTRIL ) 20 MG tablet Take 1 tablet (20 mg total) by mouth daily. 90 tablet 1   omeprazole  (PRILOSEC) 20 MG capsule Take 1 capsule (20 mg total) by mouth daily. 90 capsule 1   ondansetron  (ZOFRAN ) 8 MG tablet Take 1 tablet (8 mg total) by mouth every 8 (eight) hours as needed for nausea or vomiting. 20 tablet 1   predniSONE  (STERAPRED UNI-PAK 48 TAB) 10 MG (48) TBPK tablet Take by mouth as directed. (Patient taking differently: Take by mouth as directed. Last Dose is today 05/01/2024)     prochlorperazine  (COMPAZINE ) 10 MG tablet Take 1 tablet (10 mg total) by mouth every 6 (six) hours as needed. 30 tablet 1   rosuvastatin  (CRESTOR ) 20 MG tablet Take 1 tablet (20 mg total) by mouth daily. 90 tablet 1   Semaglutide , 1 MG/DOSE, (OZEMPIC , 1 MG/DOSE,) 4 MG/3ML SOPN INJECT 1MG  SUBCUTANEOUSLY  ONCE A WEEK AS DIRECTED 6 mL 1   No current facility-administered medications for this visit.     PHYSICAL EXAMINATION: ECOG PERFORMANCE STATUS: 1 - Symptomatic but completely ambulatory Vitals:   05/01/24 1050  BP: (!) 156/92  Pulse: 98  Resp: 17  Temp: 97.6 F (36.4 C)  SpO2: 96%   Filed Weights   05/01/24 1050  Weight: 202 lb 1.6 oz (91.7 kg)      Physical Exam Constitutional:      General: He is not in acute distress. Eyes:     General: No scleral icterus. Cardiovascular:     Rate and Rhythm: Normal rate and regular rhythm.  Pulmonary:     Effort: Pulmonary effort is normal. No respiratory  distress.     Breath sounds: No wheezing.  Abdominal:     General: Bowel sounds are normal.     Palpations: Abdomen is soft.  Musculoskeletal:        General: No deformity. Normal range of motion.     Cervical back: Normal range of motion and neck supple.     Comments: Bilateral knee replacement.   Skin:    General: Skin is warm and dry.     Findings: No erythema or rash.  Neurological:     Mental Status: He is alert and oriented to person, place, and time. Mental status is at baseline.  Psychiatric:        Mood and Affect: Mood normal.     LABORATORY DATA:  I have reviewed the data as listed    Latest Ref Rng & Units 05/01/2024   10:29 AM 03/11/2024    9:41 AM 01/08/2024    1:46 PM  CBC  WBC 4.0 - 10.5 K/uL 10.3  6.8  5.5   Hemoglobin 13.0 - 17.0 g/dL 84.2  83.7  85.6   Hematocrit 39.0 - 52.0 % 45.6  48.4  41.8   Platelets 150 - 400 K/uL 172  182  190       Latest Ref Rng & Units 05/01/2024   10:29 AM 03/11/2024    9:41 AM 01/08/2024    1:46 PM  CMP  Glucose 70 - 99 mg/dL 803  872  99   BUN 6 - 20 mg/dL 20  19  15    Creatinine 0.61 - 1.24 mg/dL 8.99  8.97  9.09   Sodium 135 - 145 mmol/L 137  133  133   Potassium 3.5 - 5.1 mmol/L 4.1  4.9  4.6   Chloride 98 - 111  mmol/L 99  100  101   CO2 22 - 32 mmol/L 28  25  26    Calcium  8.9 - 10.3 mg/dL 9.8  9.2  9.1   Total Protein 6.5 - 8.1 g/dL 6.3  7.5  6.2   Total Bilirubin 0.0 - 1.2 mg/dL 0.4  1.1  0.9   Alkaline Phos 38 - 126 U/L 80  86  63   AST 15 - 41 U/L 25  28  20    ALT 0 - 44 U/L 35  27  22     RADIOGRAPHIC STUDIES: I have personally reviewed the radiological images as listed and agreed with the findings in the report.  CT CHEST ABDOMEN PELVIS WO CONTRAST Result Date: 04/28/2024 CLINICAL DATA:  Right renal cell carcinoma restaging * Tracking Code: BO * EXAM: CT CHEST, ABDOMEN AND PELVIS WITHOUT CONTRAST TECHNIQUE: Multidetector CT imaging of the chest, abdomen and pelvis was performed following the standard  protocol without IV contrast. RADIATION DOSE REDUCTION: This exam was performed according to the departmental dose-optimization program which includes automated exposure control, adjustment of the mA and/or kV according to patient size and/or use of iterative reconstruction technique. COMPARISON:  12/17/2023 FINDINGS: CT CHEST FINDINGS Cardiovascular: Aortic atherosclerosis. Normal heart size. Left coronary artery calcifications. No pericardial effusion. Mediastinum/Nodes: No enlarged mediastinal, hilar, or axillary lymph nodes. Thyroid  gland, trachea, and esophagus demonstrate no significant findings. Lungs/Pleura: Unchanged 0.3 cm nodule in the anterior right apex (series 4, image 29). Unchanged 0.2 cm nodule of the anterior left upper lobe (series 4, image 36). No pleural effusion or pneumothorax. Musculoskeletal: No chest wall abnormality. No acute osseous findings. CT ABDOMEN PELVIS FINDINGS Hepatobiliary: No solid liver abnormality is seen. Contracted gallbladder. No gallstones, gallbladder wall thickening, or biliary dilatation. Pancreas: Unremarkable. No pancreatic ductal dilatation or surrounding inflammatory changes. Spleen: Normal in size without significant abnormality. Adrenals/Urinary Tract: Adrenal glands are unremarkable. Right nephrectomy. No suspicious soft tissue in the nephrectomy bed. Unchanged noncontrast appearance of the left kidney, notable for mixed intermediate and fluid attenuation lesions in the posterior midportion of the left kidney (series 2, image 71). No calculi or hydronephrosis. Bladder is unremarkable. Stomach/Bowel: Stomach is within normal limits. Appendix appears normal. No evidence of bowel wall thickening, distention, or inflammatory changes. Vascular/Lymphatic: Aortic atherosclerosis. No enlarged abdominal or pelvic lymph nodes. Reproductive: No mass or other abnormality. Other: No abdominal wall hernia or abnormality. No ascites. Musculoskeletal: No acute osseous findings.  IMPRESSION: 1. Right nephrectomy. No noncontrast evidence of local recurrence or metastatic disease in the chest, abdomen, or pelvis. 2. Unchanged noncontrast appearance of the left kidney, notable for mixed intermediate and fluid attenuation lesions in the posterior midportion of the left kidney. These are incompletely characterized by noncontrast CT although most likely reflect benign simple and hemorrhagic or proteinaceous cysts. 3. Unchanged tiny pulmonary nodules.  Attention on follow-up. 4. Coronary artery disease. Aortic Atherosclerosis (ICD10-I70.0). Electronically Signed   By: Marolyn JONETTA Jaksch M.D.   On: 04/28/2024 18:37

## 2024-05-01 NOTE — Assessment & Plan Note (Signed)
 Improved. Recommend imodium  PRN as directed

## 2024-05-01 NOTE — Assessment & Plan Note (Signed)
 Currently symptoms are resolved.  Continue emollient creams or lotions containing urea topically Recommend trials of Voltaren  topical twice daily PRN

## 2024-05-23 ENCOUNTER — Telehealth: Payer: Self-pay | Admitting: *Deleted

## 2024-05-23 ENCOUNTER — Encounter: Payer: Self-pay | Admitting: Oncology

## 2024-05-23 NOTE — Telephone Encounter (Signed)
 Pt's Wife's FMLA form was signed by Dr. Babara. Forms faxed to [facility] Claims. 05/23/2024- fax confirmation [id number].

## 2024-06-01 ENCOUNTER — Encounter: Payer: Self-pay | Admitting: Internal Medicine

## 2024-06-01 DIAGNOSIS — E1165 Type 2 diabetes mellitus with hyperglycemia: Secondary | ICD-10-CM

## 2024-06-02 ENCOUNTER — Encounter: Payer: Self-pay | Admitting: Oncology

## 2024-06-02 ENCOUNTER — Telehealth: Payer: Self-pay | Admitting: Pharmacist

## 2024-06-02 ENCOUNTER — Other Ambulatory Visit: Payer: Self-pay

## 2024-06-02 MED ORDER — OZEMPIC (1 MG/DOSE) 4 MG/3ML ~~LOC~~ SOPN: 1.0000 mg | PEN_INJECTOR | SUBCUTANEOUS | 1 refills | Status: AC

## 2024-06-02 NOTE — Telephone Encounter (Signed)
 Hi Alyson, I received a PA request for Cabometyx . Would this be the hold up for pt to get medication. I see Dr. Babara sent it back in Nov with 2 refills.

## 2024-06-02 NOTE — Telephone Encounter (Signed)
 Clinical Pharmacist Practitioner Encounter   Re-authorization  Prior Authorization for Cabometyx  has been approved.     PA# [id number] Effective dates: 06/02/24 through 06/02/25   Patient can continue to fill at [facility].  Patient informed via MyChart of PA approval.  Janyce Ellinger N. Solan Vosler, PharmD, BCOP, CPP Hematology/Oncology Clinical Pharmacist [facility] [phone]  06/02/2024 2:18 PM

## 2024-06-02 NOTE — Telephone Encounter (Signed)
 Clinical Pharmacist Practitioner Encounter  Re-authorization  Received notification from [employer] (via fax from [facility]) that prior authorization for Cabometyx  is required.   PA submitted on CMM Key [id number] Status is pending   Oral Oncology Clinic will continue to follow.   Dmari Schubring N. Chrsitopher Wik, PharmD, BCOP, CPP Hematology/Oncology Clinical Pharmacist [facility] [phone]  06/02/2024 2:13 PM

## 2024-06-04 ENCOUNTER — Other Ambulatory Visit: Payer: Self-pay | Admitting: *Deleted

## 2024-06-04 ENCOUNTER — Encounter: Payer: Self-pay | Admitting: Oncology

## 2024-06-04 NOTE — Telephone Encounter (Signed)
 Mychart msg from patient Could I get a refill on my Xanax  please. Less than two weeks left. I only take one at night. If you up the dosage to three a day I would only need to request a refill every three months instead of every two, just a thought. Thank you Matthew Woodard

## 2024-06-04 NOTE — Telephone Encounter (Signed)
 Patient sent mychart msg to request for RF for Xanax - script pended

## 2024-06-10 ENCOUNTER — Encounter: Payer: Self-pay | Admitting: Oncology

## 2024-06-10 MED ORDER — ALPRAZOLAM 0.5 MG PO TABS: 0.5000 mg | ORAL_TABLET | Freq: Two times a day (BID) | ORAL | 0 refills | Status: AC | PRN

## 2024-08-07 ENCOUNTER — Inpatient Hospital Stay

## 2024-08-07 ENCOUNTER — Inpatient Hospital Stay: Admitting: Oncology

## 2024-10-13 ENCOUNTER — Ambulatory Visit: Admitting: Internal Medicine
